# Patient Record
Sex: Female | Born: 1940 | Race: White | Hispanic: No | Smoking: Former smoker
Health system: Southern US, Community
[De-identification: ages and names within clinical notes are randomized; demographics above are authoritative.]

## PROBLEM LIST (undated history)

## (undated) DIAGNOSIS — I779 Disorder of arteries and arterioles, unspecified: Secondary | ICD-10-CM

## (undated) DIAGNOSIS — I10 Essential (primary) hypertension: Secondary | ICD-10-CM

## (undated) DIAGNOSIS — M8580 Other specified disorders of bone density and structure, unspecified site: Secondary | ICD-10-CM

## (undated) DIAGNOSIS — E785 Hyperlipidemia, unspecified: Secondary | ICD-10-CM

## (undated) DIAGNOSIS — I739 Peripheral vascular disease, unspecified: Secondary | ICD-10-CM

## (undated) DIAGNOSIS — M1990 Unspecified osteoarthritis, unspecified site: Secondary | ICD-10-CM

## (undated) DIAGNOSIS — F410 Panic disorder [episodic paroxysmal anxiety] without agoraphobia: Secondary | ICD-10-CM

## (undated) DIAGNOSIS — F419 Anxiety disorder, unspecified: Secondary | ICD-10-CM

## (undated) DIAGNOSIS — C801 Malignant (primary) neoplasm, unspecified: Secondary | ICD-10-CM

## (undated) DIAGNOSIS — F329 Major depressive disorder, single episode, unspecified: Secondary | ICD-10-CM

## (undated) DIAGNOSIS — R945 Abnormal results of liver function studies: Secondary | ICD-10-CM

## (undated) DIAGNOSIS — R7989 Other specified abnormal findings of blood chemistry: Secondary | ICD-10-CM

## (undated) DIAGNOSIS — E041 Nontoxic single thyroid nodule: Secondary | ICD-10-CM

## (undated) DIAGNOSIS — J449 Chronic obstructive pulmonary disease, unspecified: Secondary | ICD-10-CM

## (undated) DIAGNOSIS — H34239 Retinal artery branch occlusion, unspecified eye: Secondary | ICD-10-CM

## (undated) DIAGNOSIS — I2510 Atherosclerotic heart disease of native coronary artery without angina pectoris: Secondary | ICD-10-CM

## (undated) DIAGNOSIS — H348122 Central retinal vein occlusion, left eye, stable: Secondary | ICD-10-CM

## (undated) DIAGNOSIS — F32A Depression, unspecified: Secondary | ICD-10-CM

## (undated) HISTORY — DX: Abnormal results of liver function studies: R94.5

## (undated) HISTORY — DX: Other specified abnormal findings of blood chemistry: R79.89

## (undated) HISTORY — DX: Peripheral vascular disease, unspecified: I73.9

## (undated) HISTORY — DX: Chronic obstructive pulmonary disease, unspecified: J44.9

## (undated) HISTORY — DX: Atherosclerotic heart disease of native coronary artery without angina pectoris: I25.10

## (undated) HISTORY — DX: Disorder of arteries and arterioles, unspecified: I77.9

## (undated) HISTORY — DX: Malignant (primary) neoplasm, unspecified: C80.1

## (undated) HISTORY — DX: Major depressive disorder, single episode, unspecified: F32.9

## (undated) HISTORY — DX: Central retinal vein occlusion, left eye, stable: H34.8122

## (undated) HISTORY — DX: Anxiety disorder, unspecified: F41.9

## (undated) HISTORY — PX: CORONARY ANGIOPLASTY WITH STENT PLACEMENT: SHX49

## (undated) HISTORY — DX: Hyperlipidemia, unspecified: E78.5

## (undated) HISTORY — DX: Nontoxic single thyroid nodule: E04.1

## (undated) HISTORY — DX: Essential (primary) hypertension: I10

## (undated) HISTORY — DX: Other specified disorders of bone density and structure, unspecified site: M85.80

## (undated) HISTORY — PX: VAGINAL HYSTERECTOMY: SUR661

## (undated) HISTORY — DX: Retinal artery branch occlusion, unspecified eye: H34.239

## (undated) HISTORY — DX: Depression, unspecified: F32.A

---

## 1959-04-12 HISTORY — PX: KIDNEY SURGERY: SHX687

## 2006-10-12 ENCOUNTER — Encounter: Payer: Self-pay | Admitting: Internal Medicine

## 2007-01-25 ENCOUNTER — Ambulatory Visit: Payer: Self-pay | Admitting: Internal Medicine

## 2007-01-25 DIAGNOSIS — M949 Disorder of cartilage, unspecified: Secondary | ICD-10-CM

## 2007-01-25 DIAGNOSIS — M899 Disorder of bone, unspecified: Secondary | ICD-10-CM | POA: Insufficient documentation

## 2007-01-25 DIAGNOSIS — E785 Hyperlipidemia, unspecified: Secondary | ICD-10-CM | POA: Insufficient documentation

## 2007-01-25 DIAGNOSIS — I779 Disorder of arteries and arterioles, unspecified: Secondary | ICD-10-CM | POA: Insufficient documentation

## 2007-01-25 DIAGNOSIS — R945 Abnormal results of liver function studies: Secondary | ICD-10-CM

## 2007-01-25 DIAGNOSIS — I739 Peripheral vascular disease, unspecified: Secondary | ICD-10-CM

## 2007-01-25 DIAGNOSIS — J449 Chronic obstructive pulmonary disease, unspecified: Secondary | ICD-10-CM

## 2007-01-29 ENCOUNTER — Encounter: Admission: RE | Admit: 2007-01-29 | Discharge: 2007-01-29 | Payer: Self-pay | Admitting: Internal Medicine

## 2007-02-09 ENCOUNTER — Telehealth: Payer: Self-pay | Admitting: Internal Medicine

## 2007-02-10 LAB — CONVERTED CEMR LAB
Albumin: 3.9 g/dL (ref 3.5–5.2)
Basophils Absolute: 0 10*3/uL (ref 0.0–0.1)
Bilirubin, Direct: 0.1 mg/dL (ref 0.0–0.3)
Direct LDL: 128.1 mg/dL
Eosinophils Absolute: 0.1 10*3/uL (ref 0.0–0.6)
GFR calc non Af Amer: 131 mL/min
Glucose, Bld: 85 mg/dL (ref 70–99)
HCT: 44.6 % (ref 36.0–46.0)
Hemoglobin: 15.6 g/dL — ABNORMAL HIGH (ref 12.0–15.0)
Lymphocytes Relative: 32.4 % (ref 12.0–46.0)
MCHC: 34.9 g/dL (ref 30.0–36.0)
MCV: 93.6 fL (ref 78.0–100.0)
Monocytes Absolute: 0.5 10*3/uL (ref 0.2–0.7)
Neutro Abs: 4.5 10*3/uL (ref 1.4–7.7)
Neutrophils Relative %: 60.2 % (ref 43.0–77.0)
Potassium: 3.6 meq/L (ref 3.5–5.1)
Sodium: 142 meq/L (ref 135–145)
Triglycerides: 88 mg/dL (ref 0–149)

## 2007-04-26 ENCOUNTER — Ambulatory Visit: Payer: Self-pay | Admitting: Internal Medicine

## 2007-04-26 DIAGNOSIS — Z87891 Personal history of nicotine dependence: Secondary | ICD-10-CM | POA: Insufficient documentation

## 2007-04-26 DIAGNOSIS — E041 Nontoxic single thyroid nodule: Secondary | ICD-10-CM | POA: Insufficient documentation

## 2007-04-27 LAB — CONVERTED CEMR LAB
Cholesterol: 233 mg/dL (ref 0–200)
Direct LDL: 156 mg/dL
Total CHOL/HDL Ratio: 4.1
Triglycerides: 139 mg/dL (ref 0–149)

## 2007-05-06 ENCOUNTER — Encounter: Admission: RE | Admit: 2007-05-06 | Discharge: 2007-05-06 | Payer: Self-pay | Admitting: Internal Medicine

## 2007-06-08 ENCOUNTER — Encounter: Payer: Self-pay | Admitting: Internal Medicine

## 2007-06-08 ENCOUNTER — Encounter (INDEPENDENT_AMBULATORY_CARE_PROVIDER_SITE_OTHER): Payer: Self-pay | Admitting: Interventional Radiology

## 2007-06-08 ENCOUNTER — Other Ambulatory Visit: Admission: RE | Admit: 2007-06-08 | Discharge: 2007-06-08 | Payer: Self-pay | Admitting: Family Medicine

## 2007-06-08 ENCOUNTER — Encounter: Admission: RE | Admit: 2007-06-08 | Discharge: 2007-06-08 | Payer: Self-pay | Admitting: Internal Medicine

## 2007-06-14 ENCOUNTER — Ambulatory Visit: Payer: Self-pay | Admitting: Internal Medicine

## 2007-07-05 ENCOUNTER — Encounter: Payer: Self-pay | Admitting: Internal Medicine

## 2007-07-30 ENCOUNTER — Encounter (INDEPENDENT_AMBULATORY_CARE_PROVIDER_SITE_OTHER): Payer: Self-pay | Admitting: *Deleted

## 2007-08-02 ENCOUNTER — Ambulatory Visit: Payer: Self-pay | Admitting: Internal Medicine

## 2007-08-10 LAB — CONVERTED CEMR LAB
Cholesterol: 176 mg/dL (ref 0–200)
HDL: 63.3 mg/dL (ref >39.0)
LDL Cholesterol: 88 mg/dL (ref 0–99)
Triglycerides: 124 mg/dL (ref 0–149)
VLDL: 25 mg/dL (ref 0–40)

## 2007-08-13 ENCOUNTER — Telehealth (INDEPENDENT_AMBULATORY_CARE_PROVIDER_SITE_OTHER): Payer: Self-pay | Admitting: *Deleted

## 2007-08-24 ENCOUNTER — Encounter: Admission: RE | Admit: 2007-08-24 | Discharge: 2007-08-24 | Payer: Self-pay | Admitting: Internal Medicine

## 2007-08-30 ENCOUNTER — Telehealth (INDEPENDENT_AMBULATORY_CARE_PROVIDER_SITE_OTHER): Payer: Self-pay | Admitting: *Deleted

## 2007-09-13 ENCOUNTER — Ambulatory Visit: Payer: Self-pay | Admitting: Internal Medicine

## 2008-01-10 ENCOUNTER — Telehealth: Payer: Self-pay | Admitting: Internal Medicine

## 2008-01-12 ENCOUNTER — Ambulatory Visit: Payer: Self-pay | Admitting: Internal Medicine

## 2008-01-12 DIAGNOSIS — L989 Disorder of the skin and subcutaneous tissue, unspecified: Secondary | ICD-10-CM | POA: Insufficient documentation

## 2008-01-13 ENCOUNTER — Telehealth (INDEPENDENT_AMBULATORY_CARE_PROVIDER_SITE_OTHER): Payer: Self-pay | Admitting: *Deleted

## 2008-01-14 LAB — CONVERTED CEMR LAB
BUN: 9 mg/dL (ref 6–23)
Chloride: 114 meq/L — ABNORMAL HIGH (ref 96–112)
Cholesterol: 155 mg/dL (ref 0–200)
Creatinine, Ser: 0.6 mg/dL (ref 0.4–1.2)
GFR calc Af Amer: 128 mL/min
GFR calc non Af Amer: 106 mL/min
Hemoglobin: 15.1 g/dL — ABNORMAL HIGH (ref 12.0–15.0)
LDL Cholesterol: 84 mg/dL (ref 0–99)
TSH: 1.14 microintl units/mL (ref 0.35–5.50)
Total CHOL/HDL Ratio: 2.6
Triglycerides: 54 mg/dL (ref 0–149)
VLDL: 11 mg/dL (ref 0–40)

## 2008-01-18 ENCOUNTER — Telehealth (INDEPENDENT_AMBULATORY_CARE_PROVIDER_SITE_OTHER): Payer: Self-pay | Admitting: *Deleted

## 2008-01-19 ENCOUNTER — Telehealth (INDEPENDENT_AMBULATORY_CARE_PROVIDER_SITE_OTHER): Payer: Self-pay | Admitting: *Deleted

## 2008-01-24 ENCOUNTER — Ambulatory Visit (HOSPITAL_COMMUNITY): Admission: RE | Admit: 2008-01-24 | Discharge: 2008-01-24 | Payer: Self-pay | Admitting: Gynecology

## 2008-01-24 ENCOUNTER — Other Ambulatory Visit: Admission: RE | Admit: 2008-01-24 | Discharge: 2008-01-24 | Payer: Self-pay | Admitting: Gynecology

## 2008-01-24 ENCOUNTER — Encounter: Payer: Self-pay | Admitting: Internal Medicine

## 2008-01-25 ENCOUNTER — Ambulatory Visit: Payer: Self-pay | Admitting: Internal Medicine

## 2008-01-25 ENCOUNTER — Encounter: Payer: Self-pay | Admitting: Internal Medicine

## 2008-01-31 ENCOUNTER — Ambulatory Visit (HOSPITAL_COMMUNITY): Admission: RE | Admit: 2008-01-31 | Discharge: 2008-01-31 | Payer: Self-pay | Admitting: Gynecology

## 2008-02-04 ENCOUNTER — Encounter: Admission: RE | Admit: 2008-02-04 | Discharge: 2008-02-04 | Payer: Self-pay | Admitting: Internal Medicine

## 2008-02-09 ENCOUNTER — Encounter: Admission: RE | Admit: 2008-02-09 | Discharge: 2008-02-09 | Payer: Self-pay | Admitting: Surgery

## 2008-02-16 ENCOUNTER — Telehealth (INDEPENDENT_AMBULATORY_CARE_PROVIDER_SITE_OTHER): Payer: Self-pay | Admitting: *Deleted

## 2008-04-20 ENCOUNTER — Ambulatory Visit: Payer: Self-pay | Admitting: Gynecology

## 2008-04-20 ENCOUNTER — Encounter: Payer: Self-pay | Admitting: Internal Medicine

## 2008-04-25 ENCOUNTER — Telehealth (INDEPENDENT_AMBULATORY_CARE_PROVIDER_SITE_OTHER): Payer: Self-pay | Admitting: *Deleted

## 2008-04-28 ENCOUNTER — Encounter: Admission: RE | Admit: 2008-04-28 | Discharge: 2008-04-28 | Payer: Self-pay | Admitting: Internal Medicine

## 2008-05-05 ENCOUNTER — Telehealth (INDEPENDENT_AMBULATORY_CARE_PROVIDER_SITE_OTHER): Payer: Self-pay | Admitting: *Deleted

## 2008-06-08 ENCOUNTER — Ambulatory Visit: Payer: Self-pay | Admitting: Internal Medicine

## 2008-07-11 ENCOUNTER — Ambulatory Visit: Payer: Self-pay | Admitting: Internal Medicine

## 2008-07-14 ENCOUNTER — Encounter (INDEPENDENT_AMBULATORY_CARE_PROVIDER_SITE_OTHER): Payer: Self-pay | Admitting: *Deleted

## 2008-08-15 ENCOUNTER — Ambulatory Visit: Payer: Self-pay | Admitting: Cardiovascular Disease

## 2008-09-06 ENCOUNTER — Ambulatory Visit: Payer: Self-pay | Admitting: Cardiovascular Disease

## 2008-09-06 LAB — CONVERTED CEMR LAB
BUN: 13 mg/dL (ref 6–23)
CO2: 32 meq/L (ref 19–32)
Chloride: 106 meq/L (ref 96–112)
Glucose, Bld: 99 mg/dL (ref 70–99)
Potassium: 3.8 meq/L (ref 3.5–5.1)
Sodium: 142 meq/L (ref 135–145)

## 2008-09-29 ENCOUNTER — Telehealth: Payer: Self-pay | Admitting: Internal Medicine

## 2008-10-14 ENCOUNTER — Encounter: Payer: Self-pay | Admitting: Internal Medicine

## 2008-10-24 ENCOUNTER — Ambulatory Visit: Payer: Self-pay

## 2008-11-28 ENCOUNTER — Telehealth: Payer: Self-pay | Admitting: Cardiovascular Disease

## 2009-03-02 ENCOUNTER — Telehealth: Payer: Self-pay | Admitting: Cardiovascular Disease

## 2009-04-17 ENCOUNTER — Other Ambulatory Visit: Admission: RE | Admit: 2009-04-17 | Discharge: 2009-04-17 | Payer: Self-pay | Admitting: Gynecology

## 2009-04-17 ENCOUNTER — Ambulatory Visit: Payer: Self-pay | Admitting: Gynecology

## 2009-04-17 ENCOUNTER — Encounter: Payer: Self-pay | Admitting: Gynecology

## 2009-04-25 ENCOUNTER — Encounter: Admission: RE | Admit: 2009-04-25 | Discharge: 2009-04-25 | Payer: Self-pay | Admitting: Gynecology

## 2009-05-11 ENCOUNTER — Ambulatory Visit: Payer: Self-pay

## 2009-05-11 ENCOUNTER — Encounter: Payer: Self-pay | Admitting: Cardiovascular Disease

## 2009-05-18 ENCOUNTER — Ambulatory Visit: Payer: Self-pay | Admitting: Internal Medicine

## 2009-05-18 DIAGNOSIS — F419 Anxiety disorder, unspecified: Secondary | ICD-10-CM

## 2009-05-18 DIAGNOSIS — F329 Major depressive disorder, single episode, unspecified: Secondary | ICD-10-CM

## 2009-07-20 ENCOUNTER — Ambulatory Visit: Payer: Self-pay | Admitting: Internal Medicine

## 2009-07-20 DIAGNOSIS — I10 Essential (primary) hypertension: Secondary | ICD-10-CM | POA: Insufficient documentation

## 2009-07-25 ENCOUNTER — Encounter (INDEPENDENT_AMBULATORY_CARE_PROVIDER_SITE_OTHER): Payer: Self-pay | Admitting: *Deleted

## 2009-07-25 LAB — CONVERTED CEMR LAB
AST: 23 units/L (ref 0–37)
Basophils Relative: 0.6 % (ref 0.0–3.0)
CO2: 29 meq/L (ref 19–32)
Calcium: 9.2 mg/dL (ref 8.4–10.5)
Chloride: 101 meq/L (ref 96–112)
Eosinophils Absolute: 0.1 10*3/uL (ref 0.0–0.7)
Glucose, Bld: 76 mg/dL (ref 70–99)
HCT: 45.7 % (ref 36.0–46.0)
HDL: 65.7 mg/dL (ref >39.00)
Hemoglobin: 15.8 g/dL — ABNORMAL HIGH (ref 12.0–15.0)
Lymphocytes Relative: 30.3 % (ref 12.0–46.0)
Lymphs Abs: 2.1 10*3/uL (ref 0.7–4.0)
MCHC: 34.5 g/dL (ref 30.0–36.0)
Neutro Abs: 4.2 10*3/uL (ref 1.4–7.7)
Potassium: 4.2 meq/L (ref 3.5–5.1)
RBC: 4.83 M/uL (ref 3.87–5.11)
RDW: 12.8 % (ref 11.5–14.6)
Sodium: 138 meq/L (ref 135–145)
Total CHOL/HDL Ratio: 3
VLDL: 19.4 mg/dL (ref 0.0–40.0)

## 2009-08-17 ENCOUNTER — Telehealth: Payer: Self-pay | Admitting: Internal Medicine

## 2009-09-03 ENCOUNTER — Ambulatory Visit: Payer: Self-pay | Admitting: Internal Medicine

## 2009-09-18 ENCOUNTER — Ambulatory Visit: Payer: Self-pay | Admitting: Cardiovascular Disease

## 2009-10-08 ENCOUNTER — Ambulatory Visit: Payer: Self-pay | Admitting: Internal Medicine

## 2009-10-08 ENCOUNTER — Emergency Department (HOSPITAL_COMMUNITY): Admission: EM | Admit: 2009-10-08 | Discharge: 2009-10-08 | Payer: Self-pay | Admitting: Internal Medicine

## 2009-10-16 ENCOUNTER — Ambulatory Visit: Payer: Self-pay | Admitting: Internal Medicine

## 2009-10-16 DIAGNOSIS — J309 Allergic rhinitis, unspecified: Secondary | ICD-10-CM

## 2009-11-15 ENCOUNTER — Encounter: Payer: Self-pay | Admitting: Cardiovascular Disease

## 2009-11-16 ENCOUNTER — Ambulatory Visit: Payer: Self-pay

## 2009-11-16 ENCOUNTER — Encounter: Payer: Self-pay | Admitting: Cardiovascular Disease

## 2009-11-27 ENCOUNTER — Encounter: Admission: RE | Admit: 2009-11-27 | Discharge: 2009-11-27 | Payer: Self-pay | Admitting: Cardiovascular Disease

## 2010-04-08 ENCOUNTER — Ambulatory Visit: Payer: Self-pay | Admitting: Internal Medicine

## 2010-04-08 LAB — CONVERTED CEMR LAB: Vit D, 25-Hydroxy: 22 ng/mL — ABNORMAL LOW (ref 30–89)

## 2010-04-10 LAB — CONVERTED CEMR LAB
Calcium: 9.4 mg/dL (ref 8.4–10.5)
Chloride: 96 meq/L (ref 96–112)
Cholesterol: 183 mg/dL (ref 0–200)
Creatinine, Ser: 0.6 mg/dL (ref 0.4–1.2)
HDL: 73.4 mg/dL (ref >39.00)
Sodium: 135 meq/L (ref 135–145)
Triglycerides: 74 mg/dL (ref 0.0–149.0)
Uric Acid, Serum: 4 mg/dL (ref 2.4–7.0)

## 2010-04-25 ENCOUNTER — Ambulatory Visit: Payer: Self-pay | Admitting: Gynecology

## 2010-04-25 ENCOUNTER — Other Ambulatory Visit: Admission: RE | Admit: 2010-04-25 | Discharge: 2010-04-25 | Payer: Self-pay | Admitting: Gynecology

## 2010-04-29 ENCOUNTER — Encounter: Admission: RE | Admit: 2010-04-29 | Discharge: 2010-04-29 | Payer: Self-pay | Admitting: Gynecology

## 2010-05-06 ENCOUNTER — Encounter: Payer: Self-pay | Admitting: Internal Medicine

## 2010-05-06 ENCOUNTER — Ambulatory Visit: Payer: Self-pay | Admitting: Family Medicine

## 2010-05-08 ENCOUNTER — Encounter: Admission: RE | Admit: 2010-05-08 | Discharge: 2010-05-08 | Payer: Self-pay | Admitting: Gynecology

## 2010-05-09 ENCOUNTER — Ambulatory Visit: Payer: Self-pay | Admitting: Internal Medicine

## 2010-05-10 ENCOUNTER — Ambulatory Visit: Payer: Self-pay | Admitting: Internal Medicine

## 2010-05-15 ENCOUNTER — Encounter: Payer: Self-pay | Admitting: Internal Medicine

## 2010-05-21 ENCOUNTER — Encounter: Payer: Self-pay | Admitting: Internal Medicine

## 2010-07-02 ENCOUNTER — Ambulatory Visit: Payer: Self-pay | Admitting: Cardiovascular Disease

## 2010-07-02 ENCOUNTER — Encounter: Payer: Self-pay | Admitting: Cardiovascular Disease

## 2010-07-02 DIAGNOSIS — I2510 Atherosclerotic heart disease of native coronary artery without angina pectoris: Secondary | ICD-10-CM

## 2010-07-02 DIAGNOSIS — R0609 Other forms of dyspnea: Secondary | ICD-10-CM

## 2010-07-02 DIAGNOSIS — R0989 Other specified symptoms and signs involving the circulatory and respiratory systems: Secondary | ICD-10-CM

## 2010-07-09 ENCOUNTER — Ambulatory Visit: Payer: Self-pay | Admitting: Gynecology

## 2010-07-10 ENCOUNTER — Telehealth (INDEPENDENT_AMBULATORY_CARE_PROVIDER_SITE_OTHER): Payer: Self-pay | Admitting: Radiology

## 2010-07-12 ENCOUNTER — Telehealth: Payer: Self-pay | Admitting: Internal Medicine

## 2010-07-15 ENCOUNTER — Ambulatory Visit: Payer: Self-pay | Admitting: Gynecology

## 2010-07-16 ENCOUNTER — Ambulatory Visit: Payer: Self-pay

## 2010-07-16 ENCOUNTER — Encounter (HOSPITAL_COMMUNITY)
Admission: RE | Admit: 2010-07-16 | Discharge: 2010-09-10 | Payer: Self-pay | Source: Home / Self Care | Attending: Cardiovascular Disease | Admitting: Cardiovascular Disease

## 2010-07-16 ENCOUNTER — Encounter (INDEPENDENT_AMBULATORY_CARE_PROVIDER_SITE_OTHER): Payer: Self-pay | Admitting: *Deleted

## 2010-07-16 ENCOUNTER — Encounter: Payer: Self-pay | Admitting: Cardiology

## 2010-07-16 ENCOUNTER — Ambulatory Visit: Payer: Self-pay | Admitting: Cardiovascular Disease

## 2010-07-16 ENCOUNTER — Encounter: Payer: Self-pay | Admitting: *Deleted

## 2010-07-16 LAB — CONVERTED CEMR LAB
Basophils Relative: 0.3 % (ref 0.0–3.0)
CO2: 32 meq/L (ref 19–32)
Chloride: 93 meq/L — ABNORMAL LOW (ref 96–112)
Eosinophils Absolute: 0 10*3/uL (ref 0.0–0.7)
HCT: 45.1 % (ref 36.0–46.0)
Hemoglobin: 15.6 g/dL — ABNORMAL HIGH (ref 12.0–15.0)
Lymphs Abs: 2.6 10*3/uL (ref 0.7–4.0)
MCHC: 34.6 g/dL (ref 30.0–36.0)
MCV: 92.6 fL (ref 78.0–100.0)
Monocytes Absolute: 0.7 10*3/uL (ref 0.1–1.0)
Neutro Abs: 9.2 10*3/uL — ABNORMAL HIGH (ref 1.4–7.7)
RBC: 4.88 M/uL (ref 3.87–5.11)
Sodium: 137 meq/L (ref 135–145)

## 2010-07-17 ENCOUNTER — Ambulatory Visit
Admission: RE | Admit: 2010-07-17 | Payer: Self-pay | Source: Home / Self Care | Attending: Cardiology | Admitting: Cardiology

## 2010-07-17 ENCOUNTER — Inpatient Hospital Stay (HOSPITAL_COMMUNITY)
Admission: AD | Admit: 2010-07-17 | Discharge: 2010-07-18 | Payer: Self-pay | Source: Home / Self Care | Admitting: Cardiology

## 2010-08-02 ENCOUNTER — Ambulatory Visit: Payer: Self-pay | Admitting: Internal Medicine

## 2010-08-02 ENCOUNTER — Encounter: Payer: Self-pay | Admitting: Internal Medicine

## 2010-08-06 ENCOUNTER — Encounter (INDEPENDENT_AMBULATORY_CARE_PROVIDER_SITE_OTHER): Payer: Self-pay | Admitting: *Deleted

## 2010-08-08 ENCOUNTER — Ambulatory Visit: Payer: Self-pay | Admitting: Cardiovascular Disease

## 2010-08-08 ENCOUNTER — Encounter: Payer: Self-pay | Admitting: Cardiovascular Disease

## 2010-08-09 ENCOUNTER — Ambulatory Visit: Payer: Self-pay | Admitting: Internal Medicine

## 2010-08-13 ENCOUNTER — Ambulatory Visit
Admission: RE | Admit: 2010-08-13 | Discharge: 2010-08-13 | Payer: Self-pay | Source: Home / Self Care | Attending: Internal Medicine | Admitting: Internal Medicine

## 2010-08-13 ENCOUNTER — Encounter: Payer: Self-pay | Admitting: Internal Medicine

## 2010-08-13 LAB — CONVERTED CEMR LAB
AST: 21 units/L (ref 0–37)
Cholesterol: 187 mg/dL (ref 0–200)
LDL Cholesterol: 103 mg/dL — ABNORMAL HIGH (ref 0–99)
Total CHOL/HDL Ratio: 3
VLDL: 16.2 mg/dL (ref 0.0–40.0)

## 2010-08-16 ENCOUNTER — Telehealth: Payer: Self-pay | Admitting: Internal Medicine

## 2010-08-30 ENCOUNTER — Other Ambulatory Visit: Payer: Self-pay | Admitting: Internal Medicine

## 2010-08-30 ENCOUNTER — Ambulatory Visit
Admission: RE | Admit: 2010-08-30 | Discharge: 2010-08-30 | Payer: Self-pay | Source: Home / Self Care | Attending: Internal Medicine | Admitting: Internal Medicine

## 2010-08-30 LAB — FECAL OCCULT BLOOD, IMMUNOCHEMICAL: Fecal Occult Bld: NEGATIVE

## 2010-09-01 ENCOUNTER — Encounter: Payer: Self-pay | Admitting: Internal Medicine

## 2010-09-02 ENCOUNTER — Encounter (INDEPENDENT_AMBULATORY_CARE_PROVIDER_SITE_OTHER): Payer: Self-pay | Admitting: *Deleted

## 2010-09-11 NOTE — Progress Notes (Signed)
Summary: nuc pre-procedure  Phone Note Outgoing Call   Call placed by: Harlow Asa CNMT Call placed to: Patient Reason for Call: Confirm/change Appt Summary of Call: Reviewed information on Myoview Information Sheet (see scanned document for further details).  Spoke with patient.      Nuclear Med Background Indications for Stress Test: Evaluation for Ischemia   History: COPD   Symptoms: Chest Pain, DOE, SOB    Nuclear Pre-Procedure Cardiac Risk Factors: Carotid Disease, Family History - CAD, Hypertension, Lipids, Smoker Height (in): 63 Tech Comments: mod. bilateral stenosis

## 2010-09-11 NOTE — Assessment & Plan Note (Signed)
Summary: goute??/kn   Vital Signs:  Patient profile:   70 year old female Height:      63 inches Weight:      119.25 pounds BMI:     21.20 Pulse rate:   89 / minute Pulse rhythm:   regular BP sitting:   126 / 78  (left arm) Cuff size:   regular  Vitals Entered By: [name] CMA (April 08, 2010 8:41 AM) CC: Pt here for f/u fasting Comments c/o left foot pain- on top. Red, warm to touch- only at night does the pain bother her.    History of Present Illness: ?gout--2 episodes of severe pain at the dorsum of the left foot twice last week, symptoms self subsided. There was some associated redness, swelling and warmness now essentially asymptomatic denies injury or  exercising more than usual  COPD-- still smoking , 1/2 ppd   Hyperlipidemia-- good medication compliance    Depression--self d/c SSRIs 12-2009 , felt she didn't need it, doing well  Hypertension-- good medication compliance , no ambulatory BPs     Current Medications (verified): 1)  Actonel 150 Mg Tabs (Risedronate Sodium) .... Qmo 2)  Simvastatin 20 Mg  Tabs (Simvastatin) .Marland Kitchen.. 1 By Mouth Qhs 3)  Aspirin 81 Mg  Tbec (Aspirin) .Marland Kitchen.. 1 A Day 4)  Diovan 80 Mg Tabs (Valsartan) .... Take One Tablet By Mouth Daily 5)  Hydrochlorothiazide 25 Mg Tabs (Hydrochlorothiazide) .Marland Kitchen.. 1 A Day 6)  Calcium Carbonate-Vitamin D 600-400 Mg-Unit  Tabs (Calcium Carbonate-Vitamin D) .... Take 1 Tablet By Mouth Two Times A Day 7)  Aleve 220 Mg Tabs (Naproxen Sodium) .... As Needed 8)  Simply Sleep 25 Mg Tabs (Diphenhydramine Hcl (Sleep)) .... As Needed 9)  Claritin 10 Mg Caps (Loratadine) .... One A Day As Needed For Allergies 10)  Flonase 50 Mcg/act Susp (Fluticasone Propionate) .... 2  Sprays in Each Side of The Nose Once Daily  Allergies (verified): No Known Drug Allergies  Past History:  Past Medical History: CAROTID ARTERY DISEASE-- moderate asymptomatic Osteopenia COPD Hyperlipidemia Thyroid nodule TOBACCO  ABUSE abnormal CXR 7-09 f/u by a normal CT chest LIVER FUNCTION TESTS, ABNORMAL  Depression Hypertension, intolerant to ACEi (cough) Allergic rhinitis  Past Surgical History: Reviewed history from 09/12/2009 and no changes required. 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 retired from office work since 2003.  Widow tobbacco-- smoke  for approximately 50  years, currently 1/2 ppd  alcohol-- socially      Review of Systems CV:  Denies chest pain or discomfort and palpitations. Resp:  Denies cough, coughing up blood, and sputum productive. GI:  Denies diarrhea, nausea, and vomiting.  Physical Exam  General:  alert, well-developed, and well-nourished.   Neck:  no mass, neck  is symmetric to palpation Lungs:  normal respiratory effort, no intercostal retractions, no accessory muscle use, and decreased breath sounds Heart:  normal rate, regular rhythm, and no murmur.   Pulses:  normal pedal pulses bilaterally Extremities:  no lower extremity edema, left foot normal to inspection and palpation Psych:  Oriented X3, good eye contact, not anxious appearing, and not depressed appearing.     Impression & Recommendations:  Problem # 1:  ? of GOUT, UNSPECIFIED (ICD-274.9) ?gout, symptoms not typical Will check uric acid Rx--Observation   Her updated medication list for this problem includes:    Aleve 220 Mg Tabs (Naproxen sodium) .Marland Kitchen... As needed  Orders: Venipuncture (  45409) TLB-Uric Acid, Blood (84550-URIC)  Problem # 2:  HYPERTENSION (ICD-401.9) at goal  Her updated medication list for this problem includes:    Diovan 80 Mg Tabs (Valsartan) .Marland Kitchen... Take one tablet by mouth daily    Hydrochlorothiazide 25 Mg Tabs (Hydrochlorothiazide) .Marland Kitchen... 1 a day  BP today: 126/78 Prior BP: 100/60 (10/16/2009)  Labs Reviewed: K+: 4.2 (07/20/2009) Creat: : 0.6 (07/20/2009)   Chol:  176 (07/20/2009)   HDL: 65.70 (07/20/2009)   LDL: 91 (07/20/2009)   TG: 97.0 (07/20/2009)  Orders: TLB-BMP (Basic Metabolic Panel-BMET) (80048-METABOL)  Problem # 3:  HYPERLIPIDEMIA, BORDERLINE (ICD-272.4) labs Her updated medication list for this problem includes:    Simvastatin 20 Mg Tabs (Simvastatin) .Marland Kitchen... 1 by mouth qhs  Labs Reviewed: SGOT: 23 (07/20/2009)   SGPT: 22 (07/20/2009)   HDL:65.70 (07/20/2009), 60.2 (01/12/2008)  LDL:91 (07/20/2009), 84 (01/12/2008)  Chol:176 (07/20/2009), 155 (01/12/2008)  Trig:97.0 (07/20/2009), 54 (01/12/2008)  Orders: TLB-Lipid Panel (80061-LIPID) TLB-ALT (SGPT) (84460-ALT) TLB-AST (SGOT) (84450-SGOT)  Problem # 4:  DEPRESSION (ICD-311) self discontinue medication 12/2009, currently doing well The following medications were removed from the medication list:    Fluoxetine Hcl 20 Mg Tabs (Fluoxetine hcl) ..... Once daily  Problem # 5:  OSTEOPENIA (ICD-733.90) Had DEXA 6-09: osteopenia , to have another dexa next month @ gyn  check vitamin D Her updated medication list for this problem includes:    Actonel 150 Mg Tabs (Risedronate sodium) ..... Qmo    Calcium Carbonate-vitamin D 600-400 Mg-unit Tabs (Calcium carbonate-vitamin d) .Marland Kitchen... Take 1 tablet by mouth two times a day     Problem # 6:  THYROID NODULE (ICD-241.0) again, the patient decided not to go for surgery  Complete Medication List: 1)  Actonel 150 Mg Tabs (Risedronate sodium) .... Qmo 2)  Simvastatin 20 Mg Tabs (Simvastatin) .Marland Kitchen.. 1 by mouth qhs 3)  Aspirin 81 Mg Tbec (Aspirin) .Marland Kitchen.. 1 a day 4)  Diovan 80 Mg Tabs (Valsartan) .... Take one tablet by mouth daily 5)  Hydrochlorothiazide 25 Mg Tabs (Hydrochlorothiazide) .Marland Kitchen.. 1 a day 6)  Calcium Carbonate-vitamin D 600-400 Mg-unit Tabs (Calcium carbonate-vitamin d) .... Take 1 tablet by mouth two times a day 7)  Aleve 220 Mg Tabs (Naproxen sodium) .... As needed 8)  Simply Sleep 25 Mg Tabs (Diphenhydramine hcl (sleep)) .... As  needed 9)  Claritin 10 Mg Caps (Loratadine) .... One a day as needed for allergies 10)  Flonase 50 Mcg/act Susp (Fluticasone propionate) .... 2  sprays in each side of the nose once daily  Other Orders: T-Vitamin D (25-Hydroxy) (81191-47829)  Patient Instructions: 1)  come back for a physical exam in 3-4 months

## 2010-09-11 NOTE — Assessment & Plan Note (Signed)
Summary: Cardiology Nuclear Testing  Nuclear Med Background Indications for Stress Test: Evaluation for Ischemia   History: COPD  History Comments: No documented CAD  Symptoms: Chest Pain, Chest Pressure, DOE  Symptoms Comments: Last episode of ZO:XWRU a.m., 6/10; none now.   Nuclear Pre-Procedure Cardiac Risk Factors: Carotid Disease, Family History - CAD, Hypertension, Lipids, Smoker Caffeine/Decaff Intake: None NPO After: 10:00 PM Lungs: Clear IV 0.9% NS with Angio Cath: 22g     IV Site: R Hand IV Started by: Irean Hong, RN Chest Size (in) 34     Cup Size B     Height (in): 63 Weight (lb): 117 BMI: 20.80  Nuclear Med Study 1 or 2 day study:  1 day     Stress Test Type:  Treadmill/Lexiscan Reading MD:  Cassell Clement, MD     Referring MD:  Tonny Bollman, MD Resting Radionuclide:  Technetium 60m Tetrofosmin     Resting Radionuclide Dose:  11 mCi  Stress Radionuclide:  Technetium 80m Tetrofosmin     Stress Radionuclide Dose:  33 mCi   Stress Protocol Exercise Time (min):  4:33 min     Max HR:  118 bpm     Predicted Max HR:  150 bpm  Max Systolic BP: 181 mm Hg     Percent Max HR:  78.67 %     METS: 5.1 Rate Pressure Product:  04540  Lexiscan: 0.4 mg   Stress Test Technologist:  Rea [employer], CMA-N     Nuclear Technologist:  Domenic Polite, CNMT  Rest Procedure  Myocardial perfusion imaging was performed at rest 45 minutes following the intravenous administration of Technetium 51m Tetrofosmin.  Stress Procedure  The patient initially walked the treadmill utilizing the Bruce protocol for 4:33, but was unable to reach her target heart rate due to leg fatigue.  She then received IV Lexiscan 0.4 mg over 15-seconds with concurrent low level exercise and then Technetium 14m Tetrofosmin was injected at 30-seconds.  There were marked ST-T wave changes in recovery and occasional PAC's.  Quantitative spect images were obtained after a 45 minute delay.  QPS Raw Data Images:   Normal; no motion artifact; normal heart/lung ratio. Stress Images:  There is decreased uptake in the lateral wall. Rest Images:  Normal homogeneous uptake in all areas of the myocardium. Subtraction (SDS):  Large area of reversible inferolateral ischemia. Transient Ischemic Dilatation:  .95  (Normal <1.22)  Lung/Heart Ratio:  .32  (Normal <0.45)  Quantitative Gated Spect Images QGS EDV:  73 ml QGS ESV:  28 ml QGS EF:  62 % QGS cine images:  No wall motion abnormalites  Findings High risk nuclear study Clinically Abnormal (chest pain, ST abnormality, hypotension) Evidence for lateral ischemia      Overall Impression  Exercise Capacity: Poor exercise capacity.  Converted to [employer]. BP Response: Normal blood pressure response. Clinical Symptoms: No chest pain ECG Impression: Significant ST abnormalities consistent with ischemia. Overall Impression: High risk stress nuclear study. Overall Impression Comments: Large area of reversible inferolateral ischemia. Normal LV systolic function.

## 2010-09-11 NOTE — Miscellaneous (Signed)
Summary: BONE DENSITY  Clinical Lists Changes  Orders: Added new Test order of T-Bone Densitometry (77080) - Signed Added new Test order of T-Lumbar Vertebral Assessment (77082) - Signed 

## 2010-09-11 NOTE — Progress Notes (Signed)
  Phone Note Call from Patient Call back at [facility] Phone [phone]   Summary of Call: Patient was calling to schedule her bp check.  She started HCTZ 25mg  12/10.  BP @ home has been running 150-160/70's, HR 60's.  BMP, bp check now or adjust meds?  Citalopram not working, no interest in anything, wants to stay in bed & sleep all day.   Initial call taken by: Shary Decamp,  August 17, 2009 5:02 PM  Follow-up for Phone Call        --if she does not have a counselor , please refer -- increase citalopram 20mg  from 1 a day to 1.5 a day --continue HCTZ , OV 10d to 2 weeks --sooner if problems , suicidal ideas  Follow-up by: [name] E. Paz MD,  August 20, 2009 8:51 AM  Additional Follow-up for Phone Call Additional follow up Details #1::        discussed with pt, pt declined referral to counselor, ov scheduled Additional Follow-up by: Shary Decamp,  August 20, 2009 11:59 AM    New/Updated Medications: CITALOPRAM HYDROBROMIDE 20 MG TABS (CITALOPRAM HYDROBROMIDE) 1 1/2 by mouth once daily

## 2010-09-11 NOTE — Assessment & Plan Note (Signed)
Summary: f1y   Visit Type:  Follow-up 1 yr. Primary Provider:  Nolon Rod. Paz MD   History of Present Illness: This is a 70 year-old woman with asymptomatic carotid stenosis, presenting today for follow-up evaluation. She last underwent a carotid duplex scan in October 2010 demonstrating moderate RICA stenosis (60-79%), and mild LICA stenosis (40-59%).   She denies numbness, tingling, weakness, amaurosis, or other TIA symptoms. She denies chest pain or shortness of breath.   Lost her son to suicide last year, and has expectedly had difficulty copin with this. Dr Drue Novel has recently changed her antidepressive meds and she states that this has helped.  Current Medications (verified): 1)  Actonel 150 Mg Tabs (Risedronate Sodium) .... Qmo 2)  Simvastatin 20 Mg  Tabs (Simvastatin) .Marland Kitchen.. 1 By Mouth Qhs 3)  Aspirin 81 Mg  Tbec (Aspirin) .Marland Kitchen.. 1 A Day 4)  Diovan 80 Mg Tabs (Valsartan) .... Take One Tablet By Mouth Daily 5)  Fluoxetine Hcl 20 Mg Tabs (Fluoxetine Hcl) .... Once Daily 6)  Hydrochlorothiazide 25 Mg Tabs (Hydrochlorothiazide) .Marland Kitchen.. 1 A Day 7)  Calcium Carbonate-Vitamin D 600-400 Mg-Unit  Tabs (Calcium Carbonate-Vitamin D) .... Take 1 Tablet By Mouth Two Times A Day 8)  Aleve 220 Mg Tabs (Naproxen Sodium) .... As Needed 9)  Simply Sleep 25 Mg Tabs (Diphenhydramine Hcl (Sleep)) .... As Needed  Allergies (verified): No Known Drug Allergies  Past History:  Past medical history reviewed for relevance to current acute and chronic problems.  Past Medical History: abnormal CXR 7-09 f/u by a normal CT chest Current Problems:  CAROTID ARTERY DISEASE (ICD-433.10), moderate asymptomatic HYPERTENSION (ICD-401.9) HYPERLIPIDEMIA, BORDERLINE (ICD-272.4) DEPRESSION (ICD-311) LIVER FUNCTION TESTS, ABNORMAL (ICD-794.8) TOBACCO ABUSE (ICD-305.1) SKIN LESION (ICD-709.9) HEALTH SCREENING (ICD-V70.0) COPD (ICD-496) OSTEOPENIA (ICD-733.90) WEIGHT GAIN (ICD-783.1) FAMILY HISTORY OF CAD FEMALE 1ST  DEGREE RELATIVE <50 (ICD-V17.3) THYROID NODULE (ICD-241.0)    Vital Signs:  Patient profile:   70 year old female Height:      63 inches Weight:      117 pounds Pulse rate:   62 / minute Resp:     16 per minute BP sitting:   130 / 65  (left arm)  Vitals Entered By: Oswald Hillock (September 18, 2009 3:38 PM)  Physical Exam  General:  Pt is alert and oriented, in no acute distress. HEENT: normal Neck: normal carotid upstrokes with a right bruit, JVP normal Lungs: CTA CV: RRR without murmur or gallop Abd: soft, NT, positive BS, no bruit, no organomegaly Ext: no clubbing, cyanosis, or edema. peripheral pulses 2+ and equal Skin: warm and dry without rash    EKG  Procedure date:  05/11/2009  Findings:      60-79% RICA stenosis, 40-59% LICA stenosis  Impression & Recommendations:  Problem # 1:  CAROTID ARTERY DISEASE (ICD-433.10) Pt stable without symptoms. Continue ASA and secondary risk reduction measures...statin for hypercholesterolemia and antihypertensives. Follow-up carotid duplex April 2011, follow-up office visit one year.  Her updated medication list for this problem includes:    Aspirin 81 Mg Tbec (Aspirin) .Marland Kitchen... 1 a day  Problem # 2:  HYPERTENSION (ICD-401.9)  Well-controlled.  Her updated medication list for this problem includes:    Aspirin 81 Mg Tbec (Aspirin) .Marland Kitchen... 1 a day    Diovan 80 Mg Tabs (Valsartan) .Marland Kitchen... Take one tablet by mouth daily    Hydrochlorothiazide 25 Mg Tabs (Hydrochlorothiazide) .Marland Kitchen... 1 a day  BP today: 130/65 Prior BP: 120/80 (09/03/2009)  Labs Reviewed: K+: 4.2 (07/20/2009) Creat: :  0.6 (07/20/2009)   Chol: 176 (07/20/2009)   HDL: 65.70 (07/20/2009)   LDL: 91 (07/20/2009)   TG: 97.0 (07/20/2009)  Her updated medication list for this problem includes:    Aspirin 81 Mg Tbec (Aspirin) .Marland Kitchen... 1 a day    Diovan 80 Mg Tabs (Valsartan) .Marland Kitchen... Take one tablet by mouth daily    Hydrochlorothiazide 25 Mg Tabs (Hydrochlorothiazide)  .Marland Kitchen... 1 a day  Problem # 3:  HYPERLIPIDEMIA, BORDERLINE (ICD-272.4)  lipids at goal - treated by Dr Drue Novel.  Her updated medication list for this problem includes:    Simvastatin 20 Mg Tabs (Simvastatin) .Marland Kitchen... 1 by mouth qhs  CHOL: 176 (07/20/2009)   LDL: 91 (07/20/2009)   HDL: 65.70 (07/20/2009)   TG: 97.0 (07/20/2009)  Her updated medication list for this problem includes:    Simvastatin 20 Mg Tabs (Simvastatin) .Marland Kitchen... 1 by mouth qhs  Patient Instructions: 1)  Your physician recommends that you schedule a follow-up appointment in: please make a follow up appoint for 1 year--we will call you to schedule your carotid dopplers

## 2010-09-11 NOTE — Assessment & Plan Note (Signed)
Summary: rov/sob   Visit Type:  Follow-up Primary Provider:  Nolon Rod. Paz MD  CC:  Chest heaviness comes and goes - Sob.  History of Present Illness: 70 year-old woman presenting for followup of carotid arterial disease. She has moderate, asymptomatic carotid stenosis.  Last carotid duplex 11/16/2009 showed 40-59% bilateral ICA stenosis.  She complains of exertional dyspnea with low-level activity, newly recognized over the past 2-3 weeks. She also describes a pain in her chest and upper back, predominately in the mornings, also over several weeks. She denies exertional chest pain. No edema, palpitations, or syncope. No other complaints.   Current Medications (verified): 1)  Simvastatin 20 Mg  Tabs (Simvastatin) .Marland Kitchen.. 1 By Mouth Qhs 2)  Aspirin 81 Mg  Tbec (Aspirin) .Marland Kitchen.. 1 A Day 3)  Diovan 80 Mg Tabs (Valsartan) .... Take One Tablet By Mouth Daily 4)  Hydrochlorothiazide 25 Mg Tabs (Hydrochlorothiazide) .Marland Kitchen.. 1 A Day 5)  Calcium Carbonate-Vitamin D 600-400 Mg-Unit  Tabs (Calcium Carbonate-Vitamin D) .... Take 1 Tablet By Mouth Two Times A Day 6)  Aleve 220 Mg Tabs (Naproxen Sodium) .... As Needed 7)  Simply Sleep 25 Mg Tabs (Diphenhydramine Hcl (Sleep)) .... As Needed 8)  Claritin 10 Mg Caps (Loratadine) .... One A Day As Needed For Allergies 9)  Flonase 50 Mcg/act Susp (Fluticasone Propionate) .... 2  Sprays in Each Side of The Nose Once Daily  Allergies (verified): No Known Drug Allergies  Past History:  Past medical history reviewed for relevance to current acute and chronic problems.  Past Medical History: Reviewed history from 04/08/2010 and no changes required. CAROTID ARTERY DISEASE-- moderate asymptomatic Osteopenia COPD Hyperlipidemia Thyroid nodule TOBACCO ABUSE abnormal CXR 7-09 f/u by a normal CT chest LIVER FUNCTION TESTS, ABNORMAL  Depression Hypertension, intolerant to ACEi (cough) Allergic rhinitis  Review of Systems       Negative except as per  HPI   Vital Signs:  Patient profile:   70 year old female Height:      63 inches Weight:      119 pounds BMI:     21.16 Pulse rate:   81 / minute Pulse rhythm:   regular Resp:     18 per minute BP sitting:   128 / 70  (left arm) Cuff size:   regular  Vitals Entered By: Vikki Ports (July 02, 2010 9:11 AM)  Physical Exam  General:  Pt is alert and oriented, in no acute distress. HEENT: normal Neck: normal carotid upstrokes without bruits, JVP normal Lungs: CTA CV: RRR without murmur or gallop Abd: soft, NT, positive BS, no bruit, no organomegaly Ext: no clubbing, cyanosis, or edema. peripheral pulses 2+ and equal Skin: warm and dry without rash    EKG  Procedure date:  07/02/2010  Findings:      NSR 81 bpm, nonspecific ST-T abnormality.  Impression & Recommendations:  Problem # 1:  CHEST PAIN, PRECORDIAL (ICD-786.51) Pt with risk factors for CAD, including known carotid disease. Her symptoms have both typical and atypical features. Recommend a Myoview stress test to rule out significant ischemia. Continue ASA and secondary risk reduction measures as below. Tobacco cessation discussed.  Her updated medication list for this problem includes:    Aspirin 81 Mg Tbec (Aspirin) .Marland Kitchen... 1 a day  Orders: EKG w/ Interpretation (93000) T-2 View CXR (71020TC) Nuclear Stress Test (Nuc Stress Test)  Problem # 2:  CAROTID ARTERY DISEASE (ICD-433.10) Will need followup duplex in April 2012 for surveillance of mild-moderate carotid disease.  Her updated medication list for this problem includes:    Aspirin 81 Mg Tbec (Aspirin) .Marland Kitchen... 1 a day  Orders: EKG w/ Interpretation (93000) T-2 View CXR (71020TC) Nuclear Stress Test (Nuc Stress Test) Carotid Duplex (Carotid Duplex)  Problem # 3:  OTHER DYSPNEA AND RESPIRATORY ABNORMALITIES (ICD-786.09) Will check a CXR in settin of dyspnea and smoking history.  Her updated medication list for this problem includes:    Aspirin  81 Mg Tbec (Aspirin) .Marland Kitchen... 1 a day    Diovan 80 Mg Tabs (Valsartan) .Marland Kitchen... Take one tablet by mouth daily    Hydrochlorothiazide 25 Mg Tabs (Hydrochlorothiazide) .Marland Kitchen... 1 a day  Orders: EKG w/ Interpretation (93000) T-2 View CXR (71020TC) Nuclear Stress Test (Nuc Stress Test)  Patient Instructions: 1)  Your physician recommends that you schedule a follow-up appointment in: 1 MONTH 2)  Your physician recommends that you continue on your current medications as directed. Please refer to the Current Medication list given to you today. 3)  Your physician has requested that you have an exercise stress myoview.  For further information please visit [email or web address]  Please follow instruction sheet, as given. 4)  Chest x-ray will be obtained today.  5)  Your physician has requested that you have a carotid duplex in APRIL 2012. This test is an ultrasound of the carotid arteries in your neck. It looks at blood flow through these arteries that supply the brain with blood. Allow one hour for this exam. There are no restrictions or special instructions.

## 2010-09-11 NOTE — Progress Notes (Signed)
Summary: call from gynecology,?PROLIA  Phone Note Other Incoming   Summary of Call: phone call from Dr. Lily Peer, patient's gynecologist He is worried about her risk for fractures. We stop the Actonel based on a bone density test 9-11 He suggests possibly PROLIA Plan: --fax Dr. Lily Peer the bone density test from 9-11 and the last vitamin D results --either he or me are going to discuss PROLIA at the next office visit Jose E. Paz MD  July 12, 2010 10:06 AM   Follow-up for Phone Call        Faxed over Vitamin D Results and Bone Density.  Follow-up by: [name] CMA,  July 12, 2010 10:10 AM

## 2010-09-11 NOTE — Assessment & Plan Note (Signed)
Summary: one mth fu/ns/kdc   Vital Signs:  Patient profile:   70 year old female Height:      63 inches Weight:      116 pounds Pulse rate:   92 / minute BP sitting:   110 / 60  Vitals Entered By: Shary Decamp (October 08, 2009 2:41 PM) CC: rov - FYI - went to [facility] ED last night with ear pain, was rx'd abx drops   History of Present Illness:  went to [facility] ED last night with ear pain, patient reports she had a r ear lavage and  was rx'd abx drops her strep test was negative over all feels better  Current Medications (verified): 1)  Actonel 150 Mg Tabs (Risedronate Sodium) .... Qmo 2)  Simvastatin 20 Mg  Tabs (Simvastatin) .Marland Kitchen.. 1 By Mouth Qhs 3)  Aspirin 81 Mg  Tbec (Aspirin) .Marland Kitchen.. 1 A Day 4)  Diovan 80 Mg Tabs (Valsartan) .... Take One Tablet By Mouth Daily 5)  Fluoxetine Hcl 20 Mg Tabs (Fluoxetine Hcl) .... Once Daily 6)  Hydrochlorothiazide 25 Mg Tabs (Hydrochlorothiazide) .Marland Kitchen.. 1 A Day 7)  Calcium Carbonate-Vitamin D 600-400 Mg-Unit  Tabs (Calcium Carbonate-Vitamin D) .... Take 1 Tablet By Mouth Two Times A Day 8)  Aleve 220 Mg Tabs (Naproxen Sodium) .... As Needed 9)  Simply Sleep 25 Mg Tabs (Diphenhydramine Hcl (Sleep)) .... As Needed  Allergies (verified): No Known Drug Allergies  Past History:  Past Medical History: CAROTID ARTERY DISEASE-- moderate asymptomatic Osteopenia COPD Hyperlipidemia Thyroid nodule TOBACCO ABUSE abnormal CXR 7-09 f/u by a normal CT chest LIVER FUNCTION TESTS, ABNORMAL  Depression Hypertension, intolerant to ACEi (cough)  Past Surgical History: Reviewed history from 09/12/2009 and no changes required. h/o "floating kidney " 1960 Hysterectomy (08/11/1973), NO oophorectomy  Review of Systems       denies fevers, runny nose or sore throat no sinus congestion also, as far as her depression, she feels better, she was recently switched to Prozac  Physical Exam  General:  alert and well-developed.   Ears:  L ear normal.   right ear  with wax, unable to see much Nose:  slightly congested Lungs:  normal respiratory effort, no intercostal retractions, no accessory muscle use, and decreased breath sounds   Impression & Recommendations:  Problem # 1:  CERUMEN IMPACTION (ICD-380.4)  we repeated the  lavage  today,a large amount of cerumen was removed. TM seems  thick and red but intact Will prescribe ciprodex  Orders: Cerumen Impaction Removal (16109)  Problem # 2:  EXTERNAL OTITIS (ICD-380.10) see #1 Her updated medication list for this problem includes:    Ciprodex 0.3-0.1 % Susp (Ciprofloxacin-dexamethasone) .Marland KitchenMarland KitchenMarland KitchenMarland Kitchen 3 drops in the right ear twice a day for one week  Problem # 3:  DEPRESSION (ICD-311) doing  better, refilled fluoxetine Her updated medication list for this problem includes:    Fluoxetine Hcl 20 Mg Tabs (Fluoxetine hcl) ..... Once daily  Complete Medication List: 1)  Actonel 150 Mg Tabs (Risedronate sodium) .... Qmo 2)  Simvastatin 20 Mg Tabs (Simvastatin) .Marland Kitchen.. 1 by mouth qhs 3)  Aspirin 81 Mg Tbec (Aspirin) .Marland Kitchen.. 1 a day 4)  Diovan 80 Mg Tabs (Valsartan) .... Take one tablet by mouth daily 5)  Fluoxetine Hcl 20 Mg Tabs (Fluoxetine hcl) .... Once daily 6)  Hydrochlorothiazide 25 Mg Tabs (Hydrochlorothiazide) .Marland Kitchen.. 1 a day 7)  Calcium Carbonate-vitamin D 600-400 Mg-unit Tabs (Calcium carbonate-vitamin d) .... Take 1 tablet by mouth two times a day 8)  Aleve  220 Mg Tabs (Naproxen sodium) .... As needed 9)  Simply Sleep 25 Mg Tabs (Diphenhydramine hcl (sleep)) .... As needed 10)  Ciprodex 0.3-0.1 % Susp (Ciprofloxacin-dexamethasone) .... 3 drops in the right ear twice a day for one week  Patient Instructions: 1)  Please schedule a follow-up appointment in 2 to 3 months (fasting, yearly checkup) Prescriptions: CIPRODEX 0.3-0.1 % SUSP (CIPROFLOXACIN-DEXAMETHASONE) 3 drops in the right ear twice a day for one week  #1 x 0   Entered and Authorized by:   Nolon Rod. Paz MD   Signed by:   Nolon Rod. Paz MD on  10/08/2009   Method used:   Print then Give to Patient   RxID:   [id number] HYDROCHLOROTHIAZIDE 25 MG TABS (HYDROCHLOROTHIAZIDE) 1 a day  #90 x 1   Entered by:   Shary Decamp   Authorized by:   Nolon Rod. Paz MD   Signed by:   Shary Decamp on 10/08/2009   Method used:   Print then Give to Patient   RxID:   [id number] FLUOXETINE HCL 20 MG TABS (FLUOXETINE HCL) once daily  #90 x 1   Entered by:   Shary Decamp   Authorized by:   Nolon Rod. Paz MD   Signed by:   Shary Decamp on 10/08/2009   Method used:   Print then Give to Patient   RxID:   [id number]

## 2010-09-11 NOTE — Consult Note (Signed)
Summary: [facility]  [facility]   Imported By: Lanelle Bal 06/12/2010 12:13:58  _____________________________________________________________________  External Attachment:    Type:   Image     Comment:   External Document

## 2010-09-11 NOTE — Miscellaneous (Signed)
Summary: Appointment Canceled  Appointment status changed to canceled by [employer] on 07/11/2010 8:04 AM.  Cancellation Comments --------------------- wt 119/c r/s/786.50/mcr/bcbs prec. req/saf  Appointment Information ----------------------- Appt Type:  CARDIOLOGY NUCLEAR TESTING      Date:  Thursday, July 11, 2010      Time:  9:45 AM for 15 min   Urgency:  Routine   Made By:  Pearson Grippe  To Visit:  [id number]    Reason:  wt 119/c r/s/786.50/mcr/bcbs prec. req/saf  Appt Comments ------------- -- 07/11/10 8:04: ([facility]) CANCELED -- wt 119/c r/s/786.50/mcr/bcbs prec. req/saf -- 07/05/10 10:11: ([facility]) BOOKED -- Routine CARDIOLOGY NUCLEAR TESTING at 07/11/2010 9:45 AM for 15 min wt 119/c r/s/786.50/mcr/bcbs prec. req/saf -- 07/02/10 10:22: ([facility]

## 2010-09-11 NOTE — Consult Note (Signed)
Summary: [facility]  [facility]   Imported By: Lanelle Bal 05/30/2010 16:08:25  _____________________________________________________________________  External Attachment:    Type:   Image     Comment:   External Document

## 2010-09-11 NOTE — Assessment & Plan Note (Signed)
Summary: ROV/POSITIVE NUC/DM   Primary Provider:  Nolon Rod. Paz MD   History of Present Illness: Patricia [name] is a vascular paitent of Dr Excell Seltzer added on to my schedule for markedly positvie myovue.  She exercised for 4:33 and had over 2 mmm of ST segment depression with a large area of inferolateral ischemia from apex to base.  Patient initially seen for F/U carorotid disease.    Last carotid duplex 11/16/2009 showed 40-59% bilateral ICA stenosis.  She complained of exertional dyspnea with low-level activity, newly recognized over the past 2-3 weeks. She also describes a pain in her chest and upper back, predominately in the mornings, also over several weeks. She denies exertional chest pain. No edema, palpitations, or syncope. No other complaints. Because of these complaints she was set up for a myovue by Dr Excell Seltzer  I discussed the case with him and he is fine to have Dr Juanda Chance cath patient in am  Reviewed the myovue with patient and discussed risks of cath.  Her sister is a patient of Dr Juanda Chance and she is comfortable having him do the procedure.  She has a CXR 11/22 which had NAD which I reviewed and she does not need another one.  Labs will be done today.  Current Problems (verified): 1)  Chest Pain, Precordial  (ICD-786.51) 2)  Other Dyspnea and Respiratory Abnormalities  (ICD-786.09) 3)  Ankle Pain, Left  (ICD-719.47) 4)  Allergic Rhinitis  (ICD-477.9) 5)  Carotid Artery Disease  (ICD-433.10) 6)  Hypertension  (ICD-401.9) 7)  Hyperlipidemia, Borderline  (ICD-272.4) 8)  Depression  (ICD-311) 9)  Liver Function Tests, Abnormal  (ICD-794.8) 10)  Tobacco Abuse  (ICD-305.1) 11)  Skin Lesion  (ICD-709.9) 12)  Health Screening  (ICD-V70.0) 13)  COPD  (ICD-496) 14)  Osteopenia  (ICD-733.90) 15)  Weight Gain  (ICD-783.1) 16)  Family History of Cad Female 1st Degree Relative <50  (ICD-V17.3) 17)  Thyroid Nodule  (ICD-241.0)  Current Medications (verified): 1)  Simvastatin 20 Mg  Tabs (Simvastatin)  .Marland Kitchen.. 1 By Mouth Qhs 2)  Aspirin 81 Mg  Tbec (Aspirin) .Marland Kitchen.. 1 A Day 3)  Diovan 80 Mg Tabs (Valsartan) .... Take One Tablet By Mouth Daily 4)  Hydrochlorothiazide 25 Mg Tabs (Hydrochlorothiazide) .Marland Kitchen.. 1 A Day 5)  Calcium Carbonate-Vitamin D 600-400 Mg-Unit  Tabs (Calcium Carbonate-Vitamin D) .... Take 1 Tablet By Mouth Two Times A Day 6)  Aleve 220 Mg Tabs (Naproxen Sodium) .... As Needed 7)  Simply Sleep 25 Mg Tabs (Diphenhydramine Hcl (Sleep)) .... As Needed 8)  Claritin 10 Mg Caps (Loratadine) .... One A Day As Needed For Allergies 9)  Flonase 50 Mcg/act Susp (Fluticasone Propionate) .... 2  Sprays in Each Side of The Nose Once Daily  Allergies (verified): No Known Drug Allergies  Past History:  Past Medical History: Last updated: 04/08/2010 CAROTID ARTERY DISEASE-- moderate asymptomatic Osteopenia COPD Hyperlipidemia Thyroid nodule TOBACCO ABUSE abnormal CXR 7-09 f/u by a normal CT chest LIVER FUNCTION TESTS, ABNORMAL  Depression Hypertension, intolerant to ACEi (cough) Allergic rhinitis  Past Surgical History: Last updated: 09/12/2009 h/o "floating kidney " 1960 Hysterectomy (08/11/1973), NO oophorectomy  Family History: Last updated: 09/12/2009 CAD--Father HTN--Mother  The patient's brother died of an MI at age 18.  She has   a sister who has a permanent pacemaker at age 42.  There is no history  of stroke in the family.  colon ca--no breast cancer --no     Social History: Last updated: 04/08/2010 retired from office work since  2003.  Widow tobbacco-- smoke  for approximately 50  years, currently 1/2 ppd  alcohol-- socially      Review of Systems       Denies fever, malais, weight loss, blurry vision, decreased visual acuity, cough, sputum, SOB, hemoptysis, pleuritic pain, palpitaitons, heartburn, abdominal pain, melena, lower extremity edema, claudication, or rash.   Vital Signs:  Patient profile:   70 year old female Pulse rate:   79 / minute Resp:      12 per minute BP sitting:   135 / 62  Vitals Entered By: Deliah Goody, RN (July 16, 2010 1:03 PM)  Physical Exam  General:  Affect appropriate Healthy:  appears stated age HEENT: normal Neck supple with no adenopathy JVP normal bilateral carotid  bruits no thyromegaly Lungs clear with no wheezing and good diaphragmatic motion Heart:  S1/S2 no murmur,rub, gallop or click PMI normal Abdomen: benighn, BS positve, no tenderness, no AAA bilateral femoral  bruit.  No HSM or HJR Distal pulses intact with no bruits No edema Neuro non-focal Skin warm and dry    Impression & Recommendations:  Problem # 1:  CHEST PAIN, PRECORDIAL (ICD-786.51) Markedly positive myovue.  CAth in am with Brodie.  No Plavix given posibiliy of multivessel disease.  Can use effient or cangrelor Continue ASA  Her updated medication list for this problem includes:    Aspirin 81 Mg Tbec (Aspirin) .Marland Kitchen... 1 a day  Orders: TLB-CBC Platelet - w/Differential (85025-CBCD) TLB-PT (Protime) (85610-PTP)  Problem # 2:  CAROTID ARTERY DISEASE (ICD-433.10) Moderate bilateral disease  F/U duplex in 6 months Her updated medication list for this problem includes:    Aspirin 81 Mg Tbec (Aspirin) .Marland Kitchen... 1 a day  Problem # 3:  HYPERTENSION (ICD-401.9) Well controlled  Continue ACE and Durectic Her updated medication list for this problem includes:    Aspirin 81 Mg Tbec (Aspirin) .Marland Kitchen... 1 a day    Diovan 80 Mg Tabs (Valsartan) .Marland Kitchen... Take one tablet by mouth daily    Hydrochlorothiazide 25 Mg Tabs (Hydrochlorothiazide) .Marland Kitchen... 1 a day  Orders: TLB-BMP (Basic Metabolic Panel-BMET) (80048-METABOL)  Problem # 4:  HYPERLIPIDEMIA, BORDERLINE (ICD-272.4) Likely increase simva to 40 mg or change to generic lipitor for LDL goal of 70 or less Her updated medication list for this problem includes:    Simvastatin 20 Mg Tabs (Simvastatin) .Marland Kitchen... 1 by mouth qhs  CHOL: 183 (04/08/2010)   LDL: 95 (04/08/2010)   HDL: 73.40  (04/08/2010)   TG: 74.0 (04/08/2010)  Problem # 5:  TOBACCO ABUSE (ICD-305.1) Counseled for less than 10 minutes on smoking cesseation.  Post cath [facility] to [facility] cessation at [facility]  Patient Instructions: 1)  Your physician recommends that you schedule a follow-up appointment WITH DR Excell Seltzer AFTER CATH 2)  Your physician has requested that you have a cardiac catheterization.  Cardiac catheterization is used to diagnose and/or treat various heart conditions. Doctors may recommend this procedure for a number of different reasons. The most common reason is to evaluate chest pain. Chest pain can be a symptom of coronary artery disease (CAD), and cardiac catheterization can show whether plaque is narrowing or blocking your heart's arteries. This procedure is also used to evaluate the valves, as well as measure the blood flow and oxygen levels in different parts of your heart.  For further information please visit [email or web address]  Please follow instruction sheet, as given.

## 2010-09-11 NOTE — Assessment & Plan Note (Signed)
Summary: SPRAINED ANKLE?, SWOLLEN  //SPH   Vital Signs:  Patient profile:   70 year old female Weight:      120.13 pounds Pulse rate:   86 / minute Pulse rhythm:   regular BP sitting:   122 / 78  (left arm) Cuff size:   regular  Vitals Entered By: [name] CMA (May 09, 2010 3:19 PM) CC: (L) Ankle pain Comments swollen, red x 1 day unsure of injury [facility] [address]   History of Present Illness: pain and swelling at the left ankle x 1  day She has been doing a lot of walking but no actual injury  had an abnormal mammogram few days ago, patient reports that had a followup mammogram yesterday and she was told it was okay  ROS No fever Mild redness around the ankle   Current Medications (verified): 1)  Actonel 150 Mg Tabs (Risedronate Sodium) .... Qmo 2)  Simvastatin 20 Mg  Tabs (Simvastatin) .Marland Kitchen.. 1 By Mouth Qhs 3)  Aspirin 81 Mg  Tbec (Aspirin) .Marland Kitchen.. 1 A Day 4)  Diovan 80 Mg Tabs (Valsartan) .... Take One Tablet By Mouth Daily 5)  Hydrochlorothiazide 25 Mg Tabs (Hydrochlorothiazide) .Marland Kitchen.. 1 A Day 6)  Calcium Carbonate-Vitamin D 600-400 Mg-Unit  Tabs (Calcium Carbonate-Vitamin D) .... Take 1 Tablet By Mouth Two Times A Day 7)  Aleve 220 Mg Tabs (Naproxen Sodium) .... As Needed 8)  Simply Sleep 25 Mg Tabs (Diphenhydramine Hcl (Sleep)) .... As Needed 9)  Claritin 10 Mg Caps (Loratadine) .... One A Day As Needed For Allergies 10)  Flonase 50 Mcg/act Susp (Fluticasone Propionate) .... 2  Sprays in Each Side of The Nose Once Daily 11)  Vitamin D (Ergocalciferol) 50000 Unit Caps (Ergocalciferol) .Marland Kitchen.. 1 By Mouth Weekly For 3 Months.  Allergies (verified): No Known Drug Allergies  Past History:  Past Medical History: Reviewed history from 04/08/2010 and no changes required. CAROTID ARTERY DISEASE-- moderate asymptomatic Osteopenia COPD Hyperlipidemia Thyroid nodule TOBACCO ABUSE abnormal CXR 7-09 f/u by a normal CT chest LIVER FUNCTION TESTS, ABNORMAL    Depression Hypertension, intolerant to ACEi (cough) Allergic rhinitis  Past Surgical History: Reviewed history from 09/12/2009 and no changes required. 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 retired from office work since 2003.  Widow tobbacco-- smoke  for approximately 50  years, currently 1/2 ppd  alcohol-- socially      Physical Exam  General:  alert and well-developed.   Pulses:  normal pedal pulses bilaterally Extremities:  right foot and ankle normal left foot normal Left ankle slightly puffy around  the external malleolus, slightly tender to pressure distal from the external malleolus. Area is slightly warm and erythematous    Impression & Recommendations:  Problem # 1:  ANKLE PAIN, LEFT (ICD-719.47)  left ankle swelling, slightly warm. Recently had 2 episodes of left foot pain, question of gout, uric acid was normal. Sprain? Doubt localized cellulitis Plan: X-ray ice see instructions   Orders: T-Ankle Comp Left Min 3 Views (73610TC)  Complete Medication List: 1)  Actonel 150 Mg Tabs (Risedronate sodium) .... Qmo 2)  Simvastatin 20 Mg Tabs (Simvastatin) .Marland Kitchen.. 1 by mouth qhs 3)  Aspirin 81 Mg Tbec (Aspirin) .Marland Kitchen.. 1 a day 4)  Diovan 80 Mg Tabs (Valsartan) .... Take one tablet by mouth daily 5)  Hydrochlorothiazide 25 Mg Tabs (Hydrochlorothiazide) .Marland Kitchen.. 1 a day 6)  Calcium Carbonate-vitamin D 600-400 Mg-unit Tabs (Calcium carbonate-vitamin d) .Marland KitchenMarland KitchenMarland Kitchen  Take 1 tablet by mouth two times a day 7)  Aleve 220 Mg Tabs (Naproxen sodium) .... As needed 8)  Simply Sleep 25 Mg Tabs (Diphenhydramine hcl (sleep)) .... As needed 9)  Claritin 10 Mg Caps (Loratadine) .... One a day as needed for allergies 10)  Flonase 50 Mcg/act Susp (Fluticasone propionate) .... 2  sprays in each side of the nose once daily 11)  Vitamin D (ergocalciferol) 50000 Unit Caps (Ergocalciferol)  .Marland Kitchen.. 1 by mouth weekly for 3 months.  Other Orders: Flu Vaccine 51yrs + MEDICARE PATIENTS (Z3664) Administration Flu vaccine - MCR (Q0347)  Patient Instructions: 1)  elevated leg 2)  ice 2  times a day 3)  Aleve twice a day with food 4)  Call if not better next week, call any time if symptoms worsen Flu Vaccine Consent Questions     Do you have a history of severe allergic reactions to this vaccine? no    Any prior history of allergic reactions to egg and/or gelatin? no    Do you have a sensitivity to the preservative Thimersol? no    Do you have a past history of Guillan-Barre Syndrome? no    Do you currently have an acute febrile illness? no    Have you ever had a severe reaction to latex? no    Vaccine information given and explained to patient? yes    Are you currently pregnant? no    Lot Number:[id number]   Exp Date:02/08/2011   Site Given  Left Deltoid IMdflu

## 2010-09-11 NOTE — Assessment & Plan Note (Signed)
Summary: acute/dry cough, ears alr   Vital Signs:  Patient profile:   70 year old female Height:      63 inches Weight:      116 pounds Temp:     99.4 degrees F BP sitting:   100 / 60  Vitals Entered By: Shary Decamp (October 16, 2009 11:29 AM) CC: dry, tickle in throat.  +dry cough -- pt has similar sxs every spring, summer   History of Present Illness: dry, tickle in throat.  +dry cough -- pt has similar sxs every spring, summer this year, symptoms started gradually last month she does have a  PN drip    Current Medications (verified): 1)  Actonel 150 Mg Tabs (Risedronate Sodium) .... Qmo 2)  Simvastatin 20 Mg  Tabs (Simvastatin) .Marland Kitchen.. 1 By Mouth Qhs 3)  Aspirin 81 Mg  Tbec (Aspirin) .Marland Kitchen.. 1 A Day 4)  Diovan 80 Mg Tabs (Valsartan) .... Take One Tablet By Mouth Daily 5)  Fluoxetine Hcl 20 Mg Tabs (Fluoxetine Hcl) .... Once Daily 6)  Hydrochlorothiazide 25 Mg Tabs (Hydrochlorothiazide) .Marland Kitchen.. 1 A Day 7)  Calcium Carbonate-Vitamin D 600-400 Mg-Unit  Tabs (Calcium Carbonate-Vitamin D) .... Take 1 Tablet By Mouth Two Times A Day 8)  Aleve 220 Mg Tabs (Naproxen Sodium) .... As Needed 9)  Simply Sleep 25 Mg Tabs (Diphenhydramine Hcl (Sleep)) .... As Needed 10)  Ciprodex 0.3-0.1 % Susp (Ciprofloxacin-Dexamethasone) .... 3 Drops in The Right Ear Twice A Day For One Week  Allergies (verified): No Known Drug Allergies  Past History:  Past Medical History: CAROTID ARTERY DISEASE-- moderate asymptomatic Osteopenia COPD Hyperlipidemia Thyroid nodule TOBACCO ABUSE abnormal CXR 7-09 f/u by a normal CT chest LIVER FUNCTION TESTS, ABNORMAL  Depression Hypertension, intolerant to ACEi (cough) Allergic rhinitis  Past Surgical History: Reviewed history from 09/12/2009 and no changes required. 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   Eyes:  no redness Ears:  L ear normal.   right ear TM  slt red, no discharge Nose:  no congested Mouth:  no discharge Neck:  no mass, neck  is symmetric to palpation Lungs:  normal respiratory effort, no intercostal retractions, no accessory muscle use, and decreased breath sounds   Impression & Recommendations:  Problem # 1:  ALLERGIC RHINITIS (ICD-477.9)  symptoms consistent with allergic rhinitis seen instructions  Her updated medication list for this problem includes:    Claritin 10 Mg Caps (Loratadine) ..... One a day as needed for allergies    Flonase 50 Mcg/act Susp (Fluticasone propionate) .Marland Kitchen... 2  sprays in each side of the nose once daily  Problem # 2:  EXTERNAL OTITIS (ICD-380.10) TM not complytely normal, treatment of allergies should help still has a occasional dyscomfort, will call if no better soon ENT? The following medications were removed from the medication list:    Ciprodex 0.3-0.1 % Susp (Ciprofloxacin-dexamethasone) .Marland KitchenMarland KitchenMarland KitchenMarland Kitchen 3 drops in the right ear twice a day for one week  Complete Medication List: 1)  Actonel 150 Mg Tabs (Risedronate sodium) .... Qmo 2)  Simvastatin 20 Mg Tabs (Simvastatin) .Marland Kitchen.. 1 by mouth qhs 3)  Aspirin 81 Mg Tbec (Aspirin) .Marland Kitchen.. 1 a day 4)  Diovan 80 Mg Tabs (Valsartan) .... Take one tablet by mouth daily 5)  Fluoxetine Hcl 20 Mg Tabs (Fluoxetine hcl) .... Once daily 6)  Hydrochlorothiazide 25 Mg Tabs (Hydrochlorothiazide) .Marland Kitchen.. 1 a day 7)  Calcium Carbonate-vitamin D 600-400 Mg-unit Tabs (Calcium carbonate-vitamin d) .... Take 1 tablet by mouth two times a day 8)  Aleve 220 Mg Tabs (Naproxen sodium) .... As needed 9)  Simply Sleep 25 Mg Tabs (Diphenhydramine hcl (sleep)) .... As needed 10)  Claritin 10 Mg Caps (Loratadine) .... One a day as needed for allergies 11)  Flonase 50 Mcg/act Susp (Fluticasone propionate) .... 2  sprays in each  side of the nose once daily  Patient Instructions: 1)  Flonase daily, indefinitely 2)  claritin  10 mg over-the-counter one every day 3)  call if no better in few weeks Prescriptions: FLONASE 50 MCG/ACT SUSP (FLUTICASONE PROPIONATE) 2  sprays in each side of the nose once daily  #1 x 12   Entered and Authorized by:   Jaishawn Witzke E. Fionna Merriott MD   Signed by:   Nolon Rod. Edwards Mckelvie MD on 10/16/2009   Method used:   Print then Give to Patient   RxID:   [id number]

## 2010-09-11 NOTE — Miscellaneous (Signed)
Summary: Orders Update  Clinical Lists Changes  Orders: Added new Test order of Carotid Duplex (Carotid Duplex) - Signed 

## 2010-09-11 NOTE — Assessment & Plan Note (Signed)
Summary: rov/swh   Vital Signs:  Patient profile:   70 year old female Height:      63 inches Weight:      116.4 pounds Pulse rate:   70 / minute BP sitting:   120 / 80  Vitals Entered By: Shary Decamp (September 03, 2009 3:54 PM) CC: rov   History of Present Illness:  dry cough mostly in the morning for few months, wonders if it is related with citalopram, see review of systems  depression--we recently increased her citalopram dose, she is still not completely satisfied with the results of the medication, Prozac?  the reason she is not completely satisfied is b/c she is mildly fatigued, not as talkative as usual  Hypertension--just added HCTZ., BP normal today,  no apparent side effects  Current Medications (verified): 1)  Actonel 150 Mg Tabs (Risedronate Sodium) .... Qmo 2)  Simvastatin 20 Mg  Tabs (Simvastatin) .Marland Kitchen.. 1 By Mouth Qhs 3)  Aspirin 81 Mg  Tbec (Aspirin) .Marland Kitchen.. 1 A Day 4)  Calcium 1200mg  and Vit D 600 Units .... Every Day 5)  Diovan 80 Mg Tabs (Valsartan) .... Take One Tablet By Mouth Daily 6)  Alleve 220mg  .... As Needed 7)  Simply Sleep 25mg  .... As Needed 8)  Citalopram Hydrobromide 20 Mg Tabs (Citalopram Hydrobromide) .Marland Kitchen.. 1 1/2 By Mouth Once Daily 9)  Hydrochlorothiazide 25 Mg Tabs (Hydrochlorothiazide) .Marland Kitchen.. 1 A Day  Allergies (verified): No Known Drug Allergies  Past History:  Past Medical History: Osteopenia COPD Hyperlipidemia Carotid disease Thyroid nodule TOBACCO ABUSE abnormal CXR 7-09 f/u by a normal CT chest LIVER FUNCTION TESTS, ABNORMAL  Depression Hypertension, intolerant to ACEi (cough)  Past Surgical History: Reviewed history from 01/12/2008 and no changes required. h/o "floating kidney " 1960 Hysterectomy (08/11/1973), NO oophorectomy  Social History:  patient moved to [place] in 2006 aprox. 70 y/o [place] lives w/ her has a sister in town and other family members  Widow, lost her husband in  2006.   lost a son to suicide  2010 tobacco-- aprox 1 ppd ETOH-- socially   Review of Systems       denies postnasal dripping, no sore throat, no sputum production. No wheezing no GERD symptoms  Physical Exam  General:  alert and well-developed.   Lungs:  normal respiratory effort, no intercostal retractions, no accessory muscle use, and normal breath sounds.   Heart:  normal rate, regular rhythm, and no murmur.   Psych:  Oriented X3, normally interactive, good eye contact, and not anxious appearing.  Seems stable   Impression & Recommendations:  Problem # 1:  DEPRESSION (ICD-311)  better but not completely well , wishes to try Prozac I think that is reasonable since she is somehow fatigued instructions  Her updated medication list for this problem includes:    Fluoxetine Hcl 20 Mg Tabs (Fluoxetine hcl) ..... Half tablet for 10 days, then take one tablet a day  Orders: Prescription Created Electronically [phone])  Problem # 2:  HYPERTENSION (ICD-401.9) BP has improved with the addition of hydrochlorothiazide. BMP on return to the office she has mild dry cough, related to Diovan? the patient is willing to wait and see how she does w/ the cough  Her updated medication list for this problem includes:    Diovan 80 Mg Tabs (Valsartan) .Marland Kitchen... Take one tablet by mouth daily    Hydrochlorothiazide 25 Mg Tabs (Hydrochlorothiazide) .Marland Kitchen... 1 a day  BP today: 120/80 Prior BP: 164/80 (07/20/2009)  Labs Reviewed: K+: 4.2 (  07/20/2009) Creat: : 0.6 (07/20/2009)   Chol: 176 (07/20/2009)   HDL: 65.70 (07/20/2009)   LDL: 91 (07/20/2009)   TG: 97.0 (07/20/2009)  Problem # 3:  TOBACCO ABUSE (ICD-305.1) counseled   Problem # 4:  HEALTH SCREENING (ICD-V70.0) due for a yearly checkup, patient aware  Complete Medication List: 1)  Actonel 150 Mg Tabs (Risedronate sodium) .... Qmo 2)  Simvastatin 20 Mg Tabs (Simvastatin) .Marland Kitchen.. 1 by mouth qhs 3)  Aspirin 81 Mg Tbec (Aspirin) .Marland Kitchen.. 1 a day 4)  Calcium 1200mg  and Vit D 600  Units  .... Every day 5)  Diovan 80 Mg Tabs (Valsartan) .... Take one tablet by mouth daily 6)  Alleve 220mg   .... As needed 7)  Simply Sleep 25mg   .... As needed 8)  Fluoxetine Hcl 20 Mg Tabs (Fluoxetine hcl) .... Half tablet for 10 days, then take one tablet a day 9)  Hydrochlorothiazide 25 Mg Tabs (Hydrochlorothiazide) .Marland Kitchen.. 1 a day  Patient Instructions: 1)  for the next 10 days:  take half tablet of citalopram and start half tablet of fluoxetine 2)  after 10 days, discontinue citalopram and take a whole  fluoxetine tablet 3)  Please schedule a follow-up appointment in 1 month.  Prescriptions: FLUOXETINE HCL 20 MG TABS (FLUOXETINE HCL) half tablet for 10 days, then take one tablet a day  #30 x 1   Entered and Authorized by:   [name] E. Alyssamae Klinck MD   Signed by:   Nolon Rod. Mayo Owczarzak MD on 09/03/2009   Method used:   Electronically to        [facility] [address]* (retail)       [address] [place], [place]  [address]       Ph: [phone]       Fax: [phone]   RxID:   [id number]

## 2010-09-11 NOTE — Letter (Signed)
Summary: Cardiac Catheterization Instructions- [facility]  [facility], Main Office  [address]   [place], [place] [address]   Phone: [phone]  Fax: [phone]     07/16/2010 MRN: [id number]  DEVANIE GALANTI [address] [place], [place]  [address]  Dear Ms. Pevey,   You are scheduled for a Cardiac Catheterization on [facility] 07-17-10 with Dr. Juanda Chance  Please arrive to the 1st floor of the [facility] V[facility] Center at [facility] at      10:30 am        on the day of your procedure. Please do not arrive before 6:30 a.m. Call the Heart and Vascular Center at [phone] if you are unable to make your appointmnet. The Code to get into the parking garage under the building is 0003. Take the elevators to the 1st floor. You must have someone to drive you home. Someone must be with you for the first 24 hours after you arrive home. Please wear clothes that are easy to get on and off and wear slip-on shoes. Do not eat or drink after midnight except water with your medications that morning. Bring all your medications and current insurance cards with you.  _XX__ DO NOT take these medications before your procedure: DO NOT TAKE HYDROCHLORITHIAZIDE TOMORROW MORNING  ___ Make sure you take your aspirin.  ___ You may take ALL of your medications with water that morning. ________________________________________________________________________________________________________________________________  ___ DO NOT take ANY medications before your procedure.  ___ Pre-med instructions:  ________________________________________________________________________________________________________________________________  The usual length of stay after your procedure is 2 to 3 hours. This can vary.  If you have any questions, please call the office at the number listed above.   Deliah Goody, RN

## 2010-09-12 NOTE — Assessment & Plan Note (Signed)
Summary: per check out/sf   Visit Type:  Follow-up Primary Markon Jares:  Nolon Rod. Paz MD  CC:  none.  History of Present Illness: 70 year-old woman presenting for followup of CAD after developing symptoms of unstable angina. She was treated with PCI of the left circumflex earlier this month by Dr Juanda Chance. A drug-eluting stent platform was used.  The patient reports resolution of her angina since the PCI procedure was performed. She continues to have focal, fleeting left sided chest pain, unrelated to exertion. No other complaints today. She reports reduction on cigarettes, down from 1ppd to 5 cigs per day with a 21 mg nicotine patch.    Current Medications (verified): 1)  Simvastatin 20 Mg  Tabs (Simvastatin) .Marland Kitchen.. 1 By Mouth Qhs 2)  Aspirin 325 Mg Tabs (Aspirin) .Marland Kitchen.. 1 By Mouth Once Daily 3)  Diovan 80 Mg Tabs (Valsartan) .... Take One Tablet By Mouth Daily 4)  Hydrochlorothiazide 25 Mg Tabs (Hydrochlorothiazide) .Marland Kitchen.. 1 A Day 5)  Calcium Carbonate-Vitamin D 600-400 Mg-Unit  Tabs (Calcium Carbonate-Vitamin D) .... Take 1 Tablet By Mouth Two Times A Day 6)  Simply Sleep 25 Mg Tabs (Diphenhydramine Hcl (Sleep)) .... As Needed 7)  Claritin 10 Mg Caps (Loratadine) .... One A Day As Needed For Allergies 8)  Flonase 50 Mcg/act Susp (Fluticasone Propionate) .... 2  Sprays in Each Side of The Nose Once Daily 9)  Plavix 75 Mg Tabs (Clopidogrel Bisulfate) .Marland Kitchen.. 1 By Mouth Once Daily With Food 10)  Nitrostat 0.4 Mg Subl (Nitroglycerin) .... Dissolve 1 Tablet Under The Tongue Every 5 Mins As Needed. Max 3 Times  Allergies (verified): No Known Drug Allergies  Past History:  Past medical history reviewed for relevance to current acute and chronic problems.  Past Medical History: Reviewed history from 08/02/2010 and no changes required. CAROTID ARTERY DISEASE-- moderate asymptomatic CAD, dx 07-2010----cath, stent  Osteopenia COPD Hyperlipidemia Thyroid nodule TOBACCO ABUSE abnormal CXR 7-09 f/u  by a normal CT chest LIVER FUNCTION TESTS, ABNORMAL  Depression Hypertension, intolerant to ACEi (cough) Allergic rhinitis  Review of Systems       Negative except as per HPI   Vital Signs:  Patient profile:   70 year old female Height:      63 inches Weight:      121 pounds BMI:     21.51 Pulse rate:   70 / minute Pulse rhythm:   regular Resp:     18 per minute BP sitting:   144 / 74  (left arm) Cuff size:   regular  Vitals Entered By: Vikki Ports (August 08, 2010 9:55 AM)  Physical Exam  General:  Pt is alert and oriented, in no acute distress. HEENT: normal Neck: normal carotid upstrokes with bilaterl carotid bruits right greater than left, JVP normal Lungs: CTA CV: RRR without murmur or gallop Abd: soft, NT, positive BS, no bruit, no organomegaly Ext: no clubbing, cyanosis, or edema.  Skin: warm and dry without rash    EKG  Procedure date:  08/08/2010  Findings:      NSR 70 bpm, within normal limits  Impression & Recommendations:  Problem # 1:  CAD (ICD-414.00) Pt improved after PCI with resolution of her angina. She will continue ASA and plavix for a minimum of 12 months. I reviewed her cath images again and plan on medical therapy for her RCA stenosis. She has collaterals from the left coronary and reports no angina, so medical therapy is warranted.  Pt requests that we make note  of her wishes for DNR.  Her updated medication list for this problem includes:    Aspirin 325 Mg Tabs (Aspirin) .Marland Kitchen... 1 by mouth once daily    Plavix 75 Mg Tabs (Clopidogrel bisulfate) .Marland Kitchen... 1 by mouth once daily with food    Nitrostat 0.4 Mg Subl (Nitroglycerin) .Marland Kitchen... Dissolve 1 tablet under the tongue every 5 mins as needed. 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 Written Rx for valium for before the ultrasound - she has severe claustrophobia. By auscultation the right carotid  sounds more significant.  Her updated medication list for this problem includes:    Aspirin 325 Mg Tabs (Aspirin) .Marland Kitchen... 1 by mouth once daily    Plavix 75 Mg Tabs (Clopidogrel bisulfate) .Marland Kitchen... 1 by mouth once daily with food  Problem # 3:  HYPERLIPIDEMIA, BORDERLINE (ICD-272.4) at goal.  Her updated medication list for this problem includes:    Simvastatin 20 Mg Tabs (Simvastatin) .Marland Kitchen... 1 by mouth qhs  CHOL: 183 (04/08/2010)   LDL: 95 (04/08/2010)   HDL: 73.40 (04/08/2010)   TG: 74.0 (04/08/2010)  Problem # 4:  HYPERTENSION (ICD-401.9) Assessment: Unchanged  Her updated medication list for this problem includes:    Aspirin 325 Mg Tabs (Aspirin) .Marland Kitchen... 1 by mouth once daily    Diovan 80 Mg Tabs (Valsartan) .Marland Kitchen... Take one tablet by mouth daily    Hydrochlorothiazide 25 Mg Tabs (Hydrochlorothiazide) .Marland Kitchen... 1 a day  BP today: 144/74 Prior BP: 120/70 (08/02/2010)  Labs Reviewed: K+: 4.3 (07/16/2010) Creat: : 0.5 (07/16/2010)   Chol: 183 (04/08/2010)   HDL: 73.40 (04/08/2010)   LDL: 95 (04/08/2010)   TG: 74.0 (04/08/2010)  Patient Instructions: 1)  Your physician recommends that you schedule a follow-up appointment in: 6 months.  2)  Your physician recommends that you continue on your current medications as directed. Please refer to the Current Medication list given to you today. Prescriptions: VALIUM 5 MG TABS (DIAZEPAM) please take one pill before your carotid ultrasound.  #2 x 0   Entered by:   Whitney Maeola Sarah RN   Authorized by:   Norva Karvonen, MD   Signed by:   Ellender Hose RN on 08/08/2010   Method used:   Print then Give to Patient   RxID:   [id number]

## 2010-09-12 NOTE — Letter (Signed)
Summary: Results Follow up Letter  [facility] [place]  [address] [place], [place] [address]   Phone: [phone]  Fax: [phone]    09/02/2010 MRN: [id number]  Patricia [name] [address] [place], [place]  [address]  Dear Ms. Early,  The following are the results of your recent test(s):  Test         Result    Pap Smear:        Normal _____  Not Normal _____ Comments: ______________________________________________________ Cholesterol: LDL(Bad cholesterol):         Your goal is less than:         HDL (Good cholesterol):       Your goal is more than: Comments:  ______________________________________________________ Mammogram:        Normal _____  Not Normal _____ Comments:  ___________________________________________________________________ Hemoccult:        Normal ___X__  Not normal _______ Comments:    _____________________________________________________________________ Other Tests:    We routinely do not discuss normal results over the telephone.  If you desire a copy of the results, or you have any questions about this information we can discuss them at your next office visit.   Sincerely,

## 2010-09-12 NOTE — Progress Notes (Signed)
Summary: possible cellulitis  Phone Note Call from Patient Call back at Home Phone [phone]   Summary of Call: Patient left message on triage noting that she had cellulitis back in october of her left foot and ankle. She called ortho and they advised that she call us. Patient thinks something is beginning to happen and would like to either be seen or have ABX called in. Please advise. Initial call taken by: Lucious Groves CMA,  August 16, 2010 3:04 PM  Follow-up for Phone Call        since we cannot see her at this point and she indeed had cellulitis a month ago my plan is as follows: keflex 500  mg one p.o. q.i.d. for 10 days Elevate the leg, ice it, ER if symptoms severe Schedule office visit next week to see me Follow-up by: Kyeshia Zinn E. Jarita Raval MD,  August 16, 2010 5:02 PM  Additional Follow-up for Phone Call Additional follow up Details #1::        Left message on voicemail notifying patient. Lucious Groves CMA  August 16, 2010 5:04 PM     New/Updated Medications: KEFLEX 500 MG CAPS (CEPHALEXIN) one p.o. q.i.d. for 10 days Prescriptions: KEFLEX 500 MG CAPS (CEPHALEXIN) one p.o. q.i.d. for 10 days  #40 x 0   Entered by:   Lucious Groves CMA   Authorized by:   Nolon Rod. Shakeitha Umbaugh MD   Signed by:   Lucious Groves CMA on 08/16/2010   Method used:   Electronically to        [id number] ([facility]ail[facility]       [address] [place], [place]  [address]       Ph: [phone]       Fax: [phone]   RxID:   [id number]

## 2010-09-12 NOTE — Assessment & Plan Note (Signed)
Summary: Patricia [name] & BCBS/RH.....   Vital Signs:  Patient profile:   70 year old female Height:      63 inches Weight:      122. pounds Pulse rate:   72 / minute Pulse rhythm:   regular BP sitting:   120 / 70  (left arm) Cuff size:   regular  Vitals Entered By: Almeta Monas CMA Duncan Dull) (August 02, 2010 2:35 PM) CC: CPX--no problems. Had Heart Stent put in 07/17/10-- wants to discuss Shingles vaccine   History of Present Illness: Here for Medicare AWV:  1.   Risk factors based on Past M, S, F history: reviewed  2.   Physical Activities: only active doing her  ADLs, no exercise per se .Able to go up starirs w/o                    problems  3.   Depression/mood: mood wnl, on no meds  4.   Hearing: no problems reported or noted  5.   ADL's: totally independent  6.   Fall Risk: no recent falls  7.   Home Safety:  does feel safe at home  8.   Height, weight, &visual acuity:see VS, vision seems very good to patient . Uses glasses ; rec                    eye doctor visit , has not been check since 2007 9.   Counseling: yes  10.   Labs ordered based on risk factors: yes  11.           Referral Coordination, if needed  12.           Care Plan, see a/p 13.            Cognitive Assessment: cognition, moptor skills and memory seem appropiate  in addition, we discussed the following chart reviewed---- had a cardiac  cath few days ago , 1 stent. good medication compliance w/ asa , plavix  COPD-- essentially asx, on no inhalers  Hyperlipidemia-- good medication compliance  Thyroid nodule-- last u/s 4-11, next 11-2010  TOBACCO ABUSE-- has cut down to a 1/3 ppd , using a patch       Current Medications (verified): 1)  Simvastatin 20 Mg  Tabs (Simvastatin) .Marland Kitchen.. 1 By Mouth Qhs 2)  Aspirin 325 Mg Tabs (Aspirin) .Marland Kitchen.. 1 By Mouth Once Daily 3)  Diovan 80 Mg Tabs (Valsartan) .... Take One Tablet By Mouth Daily 4)  Hydrochlorothiazide 25 Mg Tabs (Hydrochlorothiazide) .Marland Kitchen.. 1 A  Day 5)  Calcium Carbonate-Vitamin D 600-400 Mg-Unit  Tabs (Calcium Carbonate-Vitamin D) .... Take 1 Tablet By Mouth Two Times A Day 6)  Aleve 220 Mg Tabs (Naproxen Sodium) .... As Needed 7)  Simply Sleep 25 Mg Tabs (Diphenhydramine Hcl (Sleep)) .... As Needed 8)  Claritin 10 Mg Caps (Loratadine) .... One A Day As Needed For Allergies 9)  Flonase 50 Mcg/act Susp (Fluticasone Propionate) .... 2  Sprays in Each Side of The Nose Once Daily 10)  Plavix 75 Mg Tabs (Clopidogrel Bisulfate) .Marland Kitchen.. 1 By Mouth Once Daily With Food 11)  Nitrostat 0.4 Mg Subl (Nitroglycerin) .... Dissolve 1 Tablet Under The Tongue Every 5 Mins As Needed. Max 3 Times  Allergies (verified): No Known Drug Allergies  Past History:  Past Medical History: CAROTID ARTERY DISEASE-- moderate asymptomatic CAD, dx 07-2010----cath, stent  Osteopenia COPD Hyperlipidemia Thyroid nodule TOBACCO ABUSE abnormal CXR 7-09 f/u by a normal CT  chest LIVER FUNCTION TESTS, ABNORMAL  Depression Hypertension, intolerant to ACEi (cough) Allergic rhinitis  Past Surgical History: Reviewed history from 09/12/2009 and no changes required. h/o "floating kidney " 1960 Hysterectomy (08/11/1973), NO oophorectomy  Family History: CAD--Father, B MI at age 4 HTN--Mother sister who has a permanent pacemaker at age 67.  There is no stroke --no colon ca--no breast cancer --no     Social History: retired from office work since 2003.  Widow 1 child , son, commited suicide 2010 Gson lives w/ her  tobbacco-- smoking less since heart stent 12-11  alcohol-- socially      Review of Systems Resp:  Denies cough, coughing up blood, and wheezing. GI:  Denies bloody stools, nausea, and vomiting. GU:  Denies dysuria and hematuria. MS:  Denies muscle aches.  Physical Exam  General:  alert, well-developed, and well-nourished.   Neck:  no masses and no thyromegaly.   Lungs:  normal respiratory effort, no intercostal retractions, no accessory  muscle use, and decreased breath sounds Heart:  normal rate, regular rhythm, and no murmur.   Abdomen:  soft, non-tender, no distention, no masses, no guarding, and no rigidity.   Extremities:  no pretibial edema bilaterally  Neurologic:  alert & oriented X3, strength normal in all extremities, and gait normal.   Psych:  Cognition and judgment appear intact. Alert and cooperative with normal attention span and concentration.  not anxious appearing and not depressed appearing.     Impression & Recommendations:  Problem # 1:  HEALTH SCREENING (ICD-V70.0)  Td 08 had a flu shot pneumonia shot 07-18-10 shingles immunization-- info provided   Female exam per Dr. Lily Peer, last visit September 2011 04-2010 negative mammogram  Never had a colonoscopy Colonoscopy Vs.iFOB cards reviewed w/ pt. Provided  iFOB but she will  call if he decides to have a  colonoscopy   counseling about diet, gradual exercise and tobacco abuse  labs--BMP and CBC on July 18, 2010 normal-----see instructions  Orders: Medicare -1st Annual Wellness Visit [phone])  Problem # 2:  OSTEOPENIA (ICD-733.90) last DEXA    04-2010, osteopenia, T score slightly worse went  from -1.4 to -1.6. Vitamin D 03-2010 low on calcium and vitamin D. I talked with Dr. Lily Peer, the patient's gynecologist,  a few weeks ago, he is concerned about the osteopenia and so am I. She already had Actonel for about 5 years (was discontinued recently to give her a holiday) Options now include Prolia  or Forteo after talking with the patient about this options we decided to wait for a little bit, she is recovering from a cardiac catheterization and  a new diagnoses of CAD Her updated medication list for this problem includes:    Calcium Carbonate-vitamin D 600-400 Mg-unit Tabs (Calcium carbonate-vitamin d) .Marland Kitchen... Take 1 tablet by mouth two times a day  Problem # 3:  CAD (ICD-414.00) Status post stent few days ago. per cardiology Her updated  medication list for this problem includes:    Aspirin 325 Mg Tabs (Aspirin) .Marland Kitchen... 1 by mouth once daily    Diovan 80 Mg Tabs (Valsartan) .Marland Kitchen... Take one tablet by mouth daily    Hydrochlorothiazide 25 Mg Tabs (Hydrochlorothiazide) .Marland Kitchen... 1 a day    Plavix 75 Mg Tabs (Clopidogrel bisulfate) .Marland Kitchen... 1 by mouth once daily with food    Nitrostat 0.4 Mg Subl (Nitroglycerin) .Marland Kitchen... Dissolve 1 tablet under the tongue every 5 mins as needed. max 3 times  Problem # 4:  HYPERTENSION (ICD-401.9) at goal  Her updated medication list for this problem includes:    Diovan 80 Mg Tabs (Valsartan) .Marland Kitchen... Take one tablet by mouth daily    Hydrochlorothiazide 25 Mg Tabs (Hydrochlorothiazide) .Marland Kitchen... 1 a day  BP today: 120/70 Prior BP: 135/62 (07/16/2010)  Labs Reviewed: K+: 4.3 (07/16/2010) Creat: : 0.5 (07/16/2010)   Chol: 183 (04/08/2010)   HDL: 73.40 (04/08/2010)   LDL: 95 (04/08/2010)   TG: 74.0 (04/08/2010)  Problem # 5:  HYPERLIPIDEMIA, BORDERLINE (ICD-272.4)  Her updated medication list for this problem includes:    Simvastatin 20 Mg Tabs (Simvastatin) .Marland Kitchen... 1 by mouth qhs  Labs Reviewed: SGOT: 21 (04/08/2010)   SGPT: 21 (04/08/2010)   HDL:73.40 (04/08/2010), 65.70 (07/20/2009)  LDL:95 (04/08/2010), 91 (07/20/2009)  Chol:183 (04/08/2010), 176 (07/20/2009)  Trig:74.0 (04/08/2010), 97.0 (07/20/2009)  Problem # 6:  COPD (ICD-496) has cut down tobacco use significantly last chest x-ray July 03, 2011 with no acute changes Spirometry today moderate obstruction Fortunately she is asymptomatic. On no medicines.  Orders: Spirometry w/Graph (94010)  Complete Medication List: 1)  Simvastatin 20 Mg Tabs (Simvastatin) .Marland Kitchen.. 1 by mouth qhs 2)  Aspirin 325 Mg Tabs (Aspirin) .Marland Kitchen.. 1 by mouth once daily 3)  Diovan 80 Mg Tabs (Valsartan) .... Take one tablet by mouth daily 4)  Hydrochlorothiazide 25 Mg Tabs (Hydrochlorothiazide) .Marland Kitchen.. 1 a day 5)  Calcium Carbonate-vitamin D 600-400 Mg-unit Tabs (Calcium  carbonate-vitamin d) .... Take 1 tablet by mouth two times a day 6)  Aleve 220 Mg Tabs (Naproxen sodium) .... As needed 7)  Simply Sleep 25 Mg Tabs (Diphenhydramine hcl (sleep)) .... As needed 8)  Claritin 10 Mg Caps (Loratadine) .... One a day as needed for allergies 9)  Flonase 50 Mcg/act Susp (Fluticasone propionate) .... 2  sprays in each side of the nose once daily 10)  Plavix 75 Mg Tabs (Clopidogrel bisulfate) .Marland Kitchen.. 1 by mouth once daily with food 11)  Nitrostat 0.4 Mg Subl (Nitroglycerin) .... Dissolve 1 tablet under the tongue every 5 mins as needed. max 3 times  Patient Instructions: 1)  please come fasting 2)  FLP, AST, ALT TSH ---dx   hyperlipidemia 3)  vit D---- dx  osteopenia  4)  Please schedule a follow-up appointment in 6 months .    Orders Added: 1)  Est. Patient Level III [16109] 2)  Medicare -1st Annual Wellness Visit [G0438] 3)  Spirometry w/Graph [94010]   Immunization History:  Pneumovax Immunization History:    Pneumovax:  historical (07/18/2010)   Immunization History:  Pneumovax Immunization History:    Pneumovax:  Historical (07/18/2010)

## 2010-09-12 NOTE — Letter (Signed)
Summary: [facility]: Immunoassay Fecal Occult Blood (iFOB) Order Form  [facility] [place]  [address] [place], [place] [address]   Phone: [phone]  Fax: [phone]      [facility]: Immunoassay Fecal Occult Blood (iFOB) Order Form   August 06, 2010 MRN: [id number]   Patricia [name] 16-Jan-1941   Physicican Name:__[id number]_______________________  Diagnosis Code:_____jose paz, md _____________________      [name] CMA

## 2010-09-16 ENCOUNTER — Telehealth: Payer: Self-pay | Admitting: Internal Medicine

## 2010-09-18 ENCOUNTER — Ambulatory Visit (INDEPENDENT_AMBULATORY_CARE_PROVIDER_SITE_OTHER): Payer: Medicare Other | Admitting: Internal Medicine

## 2010-09-18 ENCOUNTER — Encounter: Payer: Self-pay | Admitting: Internal Medicine

## 2010-09-18 DIAGNOSIS — I2510 Atherosclerotic heart disease of native coronary artery without angina pectoris: Secondary | ICD-10-CM

## 2010-09-18 DIAGNOSIS — M79609 Pain in unspecified limb: Secondary | ICD-10-CM | POA: Insufficient documentation

## 2010-09-26 NOTE — Assessment & Plan Note (Signed)
Summary: Discuss foot pain and podiatry referral/kb   Vital Signs:  Patient profile:   70 year old female Weight:      123.38 pounds Pulse rate:   65 / minute Pulse rhythm:   regular BP sitting:   128 / 84  (left arm) Cuff size:   regular  Vitals Entered By: [name] CMA (September 18, 2010 2:06 PM) CC: Pt here to discuss (L) foot pain Comments x 4-5 months  Pain comes and goes Worse at night and morning  [facility] [address]    History of Present Illness: continue with on and off intense left foot pain. last exacerbation started one week ago, pain is located at the mid foot, medial aspect, no radiation, worse by walking.  Review of systems  no fevers No recent injury Chart reviewed, see assessment and plan  Current Medications (verified): 1)  Simvastatin 20 Mg  Tabs (Simvastatin) .Marland Kitchen.. 1 By Mouth Qhs 2)  Aspirin 325 Mg Tabs (Aspirin) .Marland Kitchen.. 1 By Mouth Once Daily 3)  Diovan 80 Mg Tabs (Valsartan) .... Take One Tablet By Mouth Daily 4)  Hydrochlorothiazide 25 Mg Tabs (Hydrochlorothiazide) .Marland Kitchen.. 1 A Day 5)  Calcium Carbonate-Vitamin D 600-400 Mg-Unit  Tabs (Calcium Carbonate-Vitamin D) .... Take 1 Tablet By Mouth Two Times A Day 6)  Simply Sleep 25 Mg Tabs (Diphenhydramine Hcl (Sleep)) .... As Needed 7)  Claritin 10 Mg Caps (Loratadine) .... One A Day As Needed For Allergies 8)  Flonase 50 Mcg/act Susp (Fluticasone Propionate) .... 2  Sprays in Each Side of The Nose Once Daily 9)  Plavix 75 Mg Tabs (Clopidogrel Bisulfate) .Marland Kitchen.. 1 By Mouth Once Daily With Food 10)  Nitrostat 0.4 Mg Subl (Nitroglycerin) .... Dissolve 1 Tablet Under The Tongue Every 5 Mins As Needed. Max 3 Times 11)  Vit D  Allergies (verified): No Known Drug Allergies  Past History:  Past Medical History: Reviewed history from 08/02/2010 and no changes required. CAROTID ARTERY DISEASE-- moderate asymptomatic CAD, dx 07-2010----cath, stent  Osteopenia COPD Hyperlipidemia Thyroid nodule TOBACCO  ABUSE abnormal CXR 7-09 f/u by a normal CT chest LIVER FUNCTION TESTS, ABNORMAL  Depression Hypertension, intolerant to ACEi (cough) Allergic rhinitis  Past Surgical History: Reviewed history from 09/12/2009 and no changes required. h/o "floating kidney " 1960 Hysterectomy (08/11/1973), NO oophorectomy  Social History: Reviewed history from 08/02/2010 and no changes required. retired from office work since 2003.  Widow 1 child , son, commited suicide 2010 Gson lives w/ her  tobbacco-- smoking less since heart stent 12-11  alcohol-- socially      Physical Exam  General:  alert and well-developed.   Pulses:  normal pedal pulses bilaterally Extremities:  no pretibial edema bilaterally  right foot normal Left foot: No deformities, essentially symmetric compared  to the right;  at the medial aspect of the foot there is some tendernes palpation. No swelling, redness, no increase temp.   Impression & Recommendations:  Problem # 1:  FOOT PAIN, LEFT (ICD-729.5)  left foot pain This seems to be a recurrent issue X-rays and uric acid few months ago were normal She saw Dr. Arthur Holms, was diagnosed with cellulitis, prescribe antibiotics and she improved. At this point, there is no evidence of cellulitis. Midfoot arthritis? Plan: Refer back to orthopedic surgery  Orders: Orthopedic Referral (Ortho)  Complete Medication List: 1)  Simvastatin 20 Mg Tabs (Simvastatin) .Marland Kitchen.. 1 by mouth qhs 2)  Aspirin 325 Mg Tabs (Aspirin) .Marland Kitchen.. 1 by mouth once daily 3)  Diovan 80  Mg Tabs (Valsartan) .... Take one tablet by mouth daily 4)  Hydrochlorothiazide 25 Mg Tabs (Hydrochlorothiazide) .Marland Kitchen.. 1 a day 5)  Calcium Carbonate-vitamin D 600-400 Mg-unit Tabs (Calcium carbonate-vitamin d) .... Take 1 tablet by mouth two times a day 6)  Simply Sleep 25 Mg Tabs (Diphenhydramine hcl (sleep)) .... As needed 7)  Claritin 10 Mg Caps (Loratadine) .... One a day as needed for allergies 8)  Flonase 50 Mcg/act  Susp (Fluticasone propionate) .... 2  sprays in each side of the nose once daily 9)  Plavix 75 Mg Tabs (Clopidogrel bisulfate) .Marland Kitchen.. 1 by mouth once daily with food 10)  Nitrostat 0.4 Mg Subl (Nitroglycerin) .... Dissolve 1 tablet under the tongue every 5 mins as needed. max 3 times 11)  Vit D  12)  Hydrocodone-acetaminophen 5-500 Mg Tabs (Hydrocodone-acetaminophen) .Marland Kitchen.. 1 or 2  two times a day as needed pain Prescriptions: HYDROCODONE-ACETAMINOPHEN 5-500 MG TABS (HYDROCODONE-ACETAMINOPHEN) 1 or 2  two times a day as needed pain  #40 x 0   Entered and Authorized by:   Nolon Rod. Darien Kading MD   Signed by:   Nolon Rod. Deerica Waszak MD on 09/18/2010   Method used:   Print then Give to Patient   RxID:   [id number]    Orders Added: 1)  Orthopedic Referral [Ortho] 2)  Est. Patient Level III [29528]

## 2010-09-26 NOTE — Progress Notes (Signed)
Summary: Referral request--Podiatry  Phone Note Call from Patient Call back at Home Phone [phone]   Summary of Call: Patient called noting that she is having problems with her left foot and would like referral to podiatrist. She c/o a "gripping vice" that makes it hard for her to walk. Please advise.  **Patient is aware to await a call from [facility] Initial call taken by: Lucious Groves CMA,  September 16, 2010 9:13 AM  Follow-up for Phone Call        I prefer to see her before she goes to podiatry. Please arrange Follow-up by: Cecilio Ohlrich E. Cline Draheim MD,  September 16, 2010 5:18 PM  Additional Follow-up for Phone Call Additional follow up Details #1::        Patient notified and will come in. Additional Follow-up by: Lucious Groves CMA,  September 17, 2010 9:14 AM

## 2010-10-01 ENCOUNTER — Encounter: Payer: Self-pay | Admitting: Internal Medicine

## 2010-10-04 ENCOUNTER — Encounter: Payer: Self-pay | Admitting: Internal Medicine

## 2010-10-09 ENCOUNTER — Telehealth: Payer: Self-pay | Admitting: Internal Medicine

## 2010-10-17 NOTE — Progress Notes (Signed)
Summary: Hydrocodone refill  Phone Note Refill Request Message from:  Fax from Pharmacy on October 09, 2010 10:52 AM  Refills Requested: Medication #1:  HYDROCODONE-ACETAMINOPHEN 5-500 MG TABS 1 or 2  two times a day as needed pain.   Last Ref[facility]: [id number] [address] [place], [place], [place]  [address]       phone- [phone],   fax - [phone]    qty - 40  Next Appointment Scheduled: none Initial call taken by: Jerolyn Shin,  October 09, 2010 10:53 AM  Follow-up for Phone Call        40, 1 RF Tahjai Schetter E. Ksenia Kunz MD  October 09, 2010 2:32 PM     Prescriptions: HYDROCODONE-ACETAMINOPHEN 5-500 MG TABS (HYDROCODONE-ACETAMINOPHEN) 1 or 2  two times a day as needed pain  #40 x 1   Entered by:   [name] CMA   Authorized by:   Nolon Rod. Coletta Lockner MD   Signed by:   [name] CMA on 10/09/2010   Method used:   Printed then faxed to ...       [facility] [address] [facility] [address] ([facility]ail)       [address] [place], [place]  [address]       Ph: [phone]       Fax: [phone]   RxID:   [id number]

## 2010-10-18 ENCOUNTER — Telehealth: Payer: Self-pay | Admitting: Internal Medicine

## 2010-10-22 LAB — BASIC METABOLIC PANEL
CO2: 30 mEq/L (ref 19–32)
GFR calc Af Amer: >60 mL/min (ref >60)
GFR calc non Af Amer: >60 mL/min (ref >60)
Glucose, Bld: 83 mg/dL (ref 70–99)
Potassium: 4.2 mEq/L (ref 3.5–5.1)
Sodium: 140 mEq/L (ref 135–145)

## 2010-10-22 LAB — CBC
HCT: 40.5 % (ref 36.0–46.0)
Hemoglobin: 13.6 g/dL (ref 12.0–15.0)
MCHC: 33.6 g/dL (ref 30.0–36.0)
RBC: 4.43 MIL/uL (ref 3.87–5.11)

## 2010-10-22 NOTE — Medication Information (Signed)
Summary: Drug Utilization Review-Fluoxetine & Plavix  Drug Utilization Review-Fluoxetine & Plavix   Imported By: Maryln Gottron 10/16/2010 10:23:21  _____________________________________________________________________  External Attachment:    Type:   Image     Comment:   External Document

## 2010-10-22 NOTE — Progress Notes (Signed)
Summary: referral to podiatrist   Phone Note Call from Patient Call back at Home Phone [phone]   Caller: Patient Summary of Call: Spoke w/ patient says she isn't getting any where with ortho and would like to be referral to podiatrist. Initial call taken by: Doristine Devoid CMA,  October 18, 2010 11:11 AM  Follow-up for Phone Call        ok Patricia [name] E. Margaretmary Prisk MD  October 18, 2010 4:33 PM   patient aware.Marland KitchenMarland KitchenDoristine Devoid CMA  October 18, 2010 4:35 PM

## 2010-10-22 NOTE — Letter (Signed)
Summary: ?gout-----[facility]  [facility]   Imported By: Maryln Gottron 10/10/2010 14:47:55  _____________________________________________________________________  External Attachment:    Type:   Image     Comment:   External Document

## 2010-10-31 LAB — RAPID STREP SCREEN (MED CTR MEBANE ONLY): Streptococcus, Group A Screen (Direct): NEGATIVE

## 2010-11-12 ENCOUNTER — Other Ambulatory Visit: Payer: Self-pay | Admitting: Cardiovascular Disease

## 2010-11-12 DIAGNOSIS — I6529 Occlusion and stenosis of unspecified carotid artery: Secondary | ICD-10-CM

## 2010-11-13 ENCOUNTER — Other Ambulatory Visit: Payer: Self-pay | Admitting: *Deleted

## 2010-11-13 ENCOUNTER — Encounter (INDEPENDENT_AMBULATORY_CARE_PROVIDER_SITE_OTHER): Payer: Medicare Other | Admitting: *Deleted

## 2010-11-13 DIAGNOSIS — I6529 Occlusion and stenosis of unspecified carotid artery: Secondary | ICD-10-CM

## 2010-11-14 ENCOUNTER — Other Ambulatory Visit: Payer: Self-pay | Admitting: Internal Medicine

## 2010-11-18 ENCOUNTER — Encounter: Payer: Self-pay | Admitting: Cardiovascular Disease

## 2010-12-01 ENCOUNTER — Telehealth: Payer: Self-pay | Admitting: Internal Medicine

## 2010-12-01 DIAGNOSIS — E041 Nontoxic single thyroid nodule: Secondary | ICD-10-CM

## 2010-12-01 NOTE — Telephone Encounter (Signed)
Advise patient: Due for thyroid u/s, please schedule Needs OV ~ 01-2011 [facility]

## 2010-12-02 NOTE — Telephone Encounter (Signed)
I spoke w/ pt she is aware.  

## 2010-12-02 NOTE — Telephone Encounter (Signed)
Message left for patient to return my call.  

## 2010-12-04 ENCOUNTER — Ambulatory Visit
Admission: RE | Admit: 2010-12-04 | Discharge: 2010-12-04 | Disposition: A | Discharge status: Home / Self Care | Payer: Medicare Other | Source: Ambulatory Visit | Attending: Internal Medicine | Admitting: Internal Medicine

## 2010-12-04 DIAGNOSIS — E041 Nontoxic single thyroid nodule: Secondary | ICD-10-CM

## 2010-12-24 NOTE — Letter (Signed)
August 25, 2008    Patricia Ora, MD  [phone] [place], [place] [address]   RE:  Patricia, [name]  MRN:  [id number]  /  DOB:  May 07, 1941   Dear Patricia [name]:   It was my pleasure to see Patricia [name] on August 15, 2008, for  evaluation of carotid stenosis.   The patient is a 70 year old woman with no history of symptomatic  cardiovascular disease, who has undergone serial followup of carotid  stenosis with vascular ultrasound studies.  Her most recent study was  performed on September 18.  This demonstrated moderate bilateral carotid  atherosclerosis with calcification and internal carotid artery  velocities correlating with 50-69% bilateral stenoses.   From symptomatic standpoint, the patient feels well.  She has no history  of stroke, TIA, or amaurosis fugax.  She has undergone serial carotid  duplex scan since 2006 and has had slowly progressive disease.  She  denies chest pain, dyspnea, orthopnea, PND, or edema.  She does report  an episode of blurry vision that occurred approximately 5 years ago and  was self-limited.  There was no visual field loss or other symptoms  associated.   PAST MEDICAL HISTORY:  1. Dyslipidemia.  2. Osteopenia.  3. COPD.  4. History of thyroid nodule.  5. Tobacco abuse.  6. History of abnormal liver function tests.  7. Hysterectomy in the 1970s.  8. Osteoarthritis.   CURRENT MEDICATIONS:  1. Aspirin 81 mg daily.  2. Simvastatin 20 mg at bedtime.  3. Actonel 150 mg monthly.  4. Calcium plus D 1200 mg daily.   SOCIAL HISTORY:  The patient is retired from office work since 2003.  She has been widowed for 3 years.  She is a smoker for approximately 50  years, currently a one-half-pack daily.  She drinks alcohol socially.   FAMILY HISTORY:  The patient's brother died of an MI at age 27.  She has  a sister who has a permanent pacemaker at age 19.  There is no history  of stroke in the family.   REVIEW OF SYSTEMS:  Complete 12-point review of  systems was performed is  negative except as outlined above.   PHYSICAL EXAMINATION:  GENERAL:  The patient is alert and oriented.  She  is in no acute distress.  VITAL SIGNS:  Weight is 127 pounds, blood pressure is 152/96, heart rate  is 68, and respiratory rate is 12.  HEENT:  Normal.  NECK:  Normal carotid upstrokes, has a right carotid bruit present.  No  thyromegaly or thyroid nodules are appreciated.  LUNGS:  Clear bilaterally.  HEART:  Regular rate and rhythm without murmurs or gallops.  ABDOMEN:  Soft and nontender.  No abdominal bruits.  No organomegaly.  BACK:  No CVA tenderness.  EXTREMITIES:  Peripheral pulses are 2+ and equal throughout.  Her  bilateral femoral bruits present.  SKIN:  Warm and dry without rash.  NEUROLOGIC:  Cranial nerves II through XII are intact.  Strength is  intact and equal.  LYMPHATICS:  No adenopathy.   ASSESSMENT:  This is a 70 year old woman with moderate bilateral carotid  stenosis.  The patient is asymptomatic.  She will require long-term  followup and aggressive risk reduction measures to prevent progressive  carotid stenosis and need for carotid endarterectomy.  She is currently  well treated with simvastatin to lower her cholesterol and she takes  some daily aspirin.  I counseled her at length regarding the importance  of tobacco cessation.  Her blood pressure is elevated today and she  reports that it has been elevated at times in the past.  I have taken  the liberty of starting her on lisinopril 10 mg daily.  She should have  a followup metabolic panel in a few weeks.  She has had normal  creatinine in the past.   RECOMMENDATIONS:  Recommend followup carotid duplex in 6 months, which  will be arranged.  Depending on the severity of stenosis, she will need  6 months-to-annual followup  thereafter.  If her carotid stenosis approaches the 80% range by Doppler  criteria, we will need to consider carotid surgery for stroke   prevention.   Jose, thanks again for asking me to see this very nice lady.  Please  feel free to call at any time with questions.    Sincerely,      Veverly Fells. Excell Seltzer, MD  Electronically Signed    MDC/MedQ  DD: 08/25/2008  DT: 08/25/2008  Job #: [id number]

## 2010-12-27 ENCOUNTER — Other Ambulatory Visit: Payer: Self-pay | Admitting: Cardiovascular Disease

## 2011-01-09 ENCOUNTER — Other Ambulatory Visit: Payer: Self-pay | Admitting: Cardiovascular Disease

## 2011-02-13 ENCOUNTER — Other Ambulatory Visit: Payer: Self-pay | Admitting: *Deleted

## 2011-02-13 MED ORDER — CLOPIDOGREL BISULFATE 75 MG PO TABS: 75.0000 mg | ORAL_TABLET | Freq: Every day | ORAL | Status: DC

## 2011-02-25 ENCOUNTER — Other Ambulatory Visit: Payer: Self-pay | Admitting: Internal Medicine

## 2011-03-10 ENCOUNTER — Encounter: Payer: Self-pay | Admitting: Cardiovascular Disease

## 2011-04-08 ENCOUNTER — Encounter: Payer: Self-pay | Admitting: Cardiovascular Disease

## 2011-04-08 ENCOUNTER — Ambulatory Visit (INDEPENDENT_AMBULATORY_CARE_PROVIDER_SITE_OTHER): Payer: Medicare Other | Admitting: Cardiovascular Disease

## 2011-04-08 VITALS — BP 124/72 | HR 64 | Ht 62.0 in | Wt 122.0 lb

## 2011-04-08 DIAGNOSIS — I2510 Atherosclerotic heart disease of native coronary artery without angina pectoris: Secondary | ICD-10-CM

## 2011-04-08 DIAGNOSIS — I6529 Occlusion and stenosis of unspecified carotid artery: Secondary | ICD-10-CM

## 2011-04-08 NOTE — Progress Notes (Signed)
HPI:  This is a 70 year old woman presenting for followup evaluation. The patient has coronary artery disease and underwent percutaneous intervention in December 2011 when she was treated with a drug-eluting stent in the left circumflex. She was also noted to have severe stenosis of the right coronary artery but the distal vessel was supplied by collaterals and she was treated medically. The patient has had a marked improvement in her symptoms ever since her PCI procedure. She does complain of continued fatigue. She takes nitroglycerin about 2 times per week for anginal symptoms. She does not have typical chest pain or tightness with exertion but does have occasional left-sided chest pain. Her symptoms usually occur in the morning hours and there is no change in the pattern over the last year.  As noted the patient has moderate bilateral carotid artery stenosis. She has no history of stroke or TIA she has no neurologic symptoms at the present time.    No Known Allergies  Past Medical History  Diagnosis Date  . Carotid artery disease     moderate; asymptomatic  . CAD (coronary artery disease)     cath with stent placed  . Osteopenia   . COPD (chronic obstructive pulmonary disease)   . Hyperlipidemia   . Thyroid nodule   . Abnormal LFTs   . Depression   . HTN (hypertension)     intolerant to ACEi (cough)  . Allergic rhinitis     ROS: Negative except as per HPI  BP 124/72  Pulse 64  Ht 5\' 2"  (1.575 m)  Wt 122 lb (55.339 kg)  BMI 22.31 kg/m2  PHYSICAL EXAM: Pt is alert and oriented, NAD HEENT: normal Neck: JVP - normal, carotids 2+= with a right carotid bruit Lungs: CTA bilaterally CV: RRR without murmur or gallop Abd: soft, NT, Positive BS, no hepatomegaly Ext: no C/C/E, distal pulses intact and equal Skin: warm/dry no rash  EKG:  Normal sinus rhythm 64 beats per minute, within normal limits.  ASSESSMENT AND PLAN:

## 2011-04-08 NOTE — Patient Instructions (Signed)
Your physician wants you to follow-up in: January 2013. You will receive a reminder letter in the mail two months in advance. If you don't receive a letter, please call our office to schedule the follow-up appointment.  Your physician has requested that you have a carotid duplex. This test is an ultrasound of the carotid arteries in your neck. It looks at blood flow through these arteries that supply the brain with blood. Allow one hour for this exam. There are no restrictions or special instructions. To be done in April 2013.

## 2011-04-17 ENCOUNTER — Telehealth: Payer: Self-pay | Admitting: Cardiovascular Disease

## 2011-04-17 DIAGNOSIS — I2510 Atherosclerotic heart disease of native coronary artery without angina pectoris: Secondary | ICD-10-CM

## 2011-04-17 MED ORDER — CLOPIDOGREL BISULFATE 75 MG PO TABS: 75.0000 mg | ORAL_TABLET | Freq: Every day | ORAL | Status: DC

## 2011-04-17 NOTE — Telephone Encounter (Signed)
Pt needs new pres for Plavix for 90 day supply instead of 30 day supply.  Call into [facility] on [address] [phone]

## 2011-04-17 NOTE — Telephone Encounter (Signed)
Prescription sent

## 2011-05-01 NOTE — Assessment & Plan Note (Signed)
The patient has moderate carotid stenosis, asymptomatic. Continue carotid duplex surveillance, and extended to be done in April 2013.

## 2011-05-01 NOTE — Assessment & Plan Note (Signed)
The patient has stable angina, class II symptoms with no change in overall pattern. She should remain on her current medical program and I would like to see her back in approximately 6 months for followup. She is on dual antiplatelet therapy with aspirin and Plavix, lipid-lowering with simvastatin, and her hypertension is treated with Diovan.

## 2011-05-18 ENCOUNTER — Other Ambulatory Visit: Payer: Self-pay | Admitting: Cardiovascular Disease

## 2011-06-10 ENCOUNTER — Other Ambulatory Visit: Payer: Self-pay | Admitting: Internal Medicine

## 2011-07-02 ENCOUNTER — Encounter (HOSPITAL_COMMUNITY)
Admission: EM | Disposition: A | Discharge status: Home / Self Care | Payer: Self-pay | Source: Home / Self Care | Attending: Cardiovascular Disease

## 2011-07-02 ENCOUNTER — Encounter (HOSPITAL_COMMUNITY): Payer: Self-pay | Admitting: *Deleted

## 2011-07-02 ENCOUNTER — Other Ambulatory Visit: Payer: Self-pay

## 2011-07-02 ENCOUNTER — Emergency Department (HOSPITAL_COMMUNITY): Payer: Medicare Other

## 2011-07-02 ENCOUNTER — Inpatient Hospital Stay (HOSPITAL_COMMUNITY): Payer: Medicare Other

## 2011-07-02 ENCOUNTER — Inpatient Hospital Stay (HOSPITAL_COMMUNITY)
Admission: EM | Admit: 2011-07-02 | Discharge: 2011-07-09 | DRG: 250 | Disposition: A | Discharge status: Home / Self Care | Payer: Medicare Other | Attending: Cardiovascular Disease | Admitting: Cardiovascular Disease

## 2011-07-02 DIAGNOSIS — D72829 Elevated white blood cell count, unspecified: Secondary | ICD-10-CM

## 2011-07-02 DIAGNOSIS — R0902 Hypoxemia: Secondary | ICD-10-CM | POA: Diagnosis present

## 2011-07-02 DIAGNOSIS — Y849 Medical procedure, unspecified as the cause of abnormal reaction of the patient, or of later complication, without mention of misadventure at the time of the procedure: Secondary | ICD-10-CM | POA: Diagnosis present

## 2011-07-02 DIAGNOSIS — R0609 Other forms of dyspnea: Secondary | ICD-10-CM

## 2011-07-02 DIAGNOSIS — E871 Hypo-osmolality and hyponatremia: Secondary | ICD-10-CM | POA: Diagnosis present

## 2011-07-02 DIAGNOSIS — L989 Disorder of the skin and subcutaneous tissue, unspecified: Secondary | ICD-10-CM

## 2011-07-02 DIAGNOSIS — I249 Acute ischemic heart disease, unspecified: Secondary | ICD-10-CM

## 2011-07-02 DIAGNOSIS — Z79899 Other long term (current) drug therapy: Secondary | ICD-10-CM

## 2011-07-02 DIAGNOSIS — E8779 Other fluid overload: Secondary | ICD-10-CM | POA: Diagnosis not present

## 2011-07-02 DIAGNOSIS — F3289 Other specified depressive episodes: Secondary | ICD-10-CM | POA: Diagnosis present

## 2011-07-02 DIAGNOSIS — F17200 Nicotine dependence, unspecified, uncomplicated: Secondary | ICD-10-CM | POA: Diagnosis present

## 2011-07-02 DIAGNOSIS — I2510 Atherosclerotic heart disease of native coronary artery without angina pectoris: Secondary | ICD-10-CM

## 2011-07-02 DIAGNOSIS — J9819 Other pulmonary collapse: Secondary | ICD-10-CM | POA: Diagnosis present

## 2011-07-02 DIAGNOSIS — I6529 Occlusion and stenosis of unspecified carotid artery: Secondary | ICD-10-CM | POA: Diagnosis present

## 2011-07-02 DIAGNOSIS — T82897A Other specified complication of cardiac prosthetic devices, implants and grafts, initial encounter: Secondary | ICD-10-CM | POA: Diagnosis present

## 2011-07-02 DIAGNOSIS — I517 Cardiomegaly: Secondary | ICD-10-CM | POA: Diagnosis present

## 2011-07-02 DIAGNOSIS — E876 Hypokalemia: Secondary | ICD-10-CM | POA: Diagnosis not present

## 2011-07-02 DIAGNOSIS — I10 Essential (primary) hypertension: Secondary | ICD-10-CM | POA: Diagnosis present

## 2011-07-02 DIAGNOSIS — F410 Panic disorder [episodic paroxysmal anxiety] without agoraphobia: Secondary | ICD-10-CM | POA: Diagnosis present

## 2011-07-02 DIAGNOSIS — M949 Disorder of cartilage, unspecified: Secondary | ICD-10-CM

## 2011-07-02 DIAGNOSIS — T380X5A Adverse effect of glucocorticoids and synthetic analogues, initial encounter: Secondary | ICD-10-CM | POA: Diagnosis present

## 2011-07-02 DIAGNOSIS — J309 Allergic rhinitis, unspecified: Secondary | ICD-10-CM | POA: Diagnosis present

## 2011-07-02 DIAGNOSIS — Z7982 Long term (current) use of aspirin: Secondary | ICD-10-CM

## 2011-07-02 DIAGNOSIS — R7989 Other specified abnormal findings of blood chemistry: Secondary | ICD-10-CM | POA: Diagnosis present

## 2011-07-02 DIAGNOSIS — F329 Major depressive disorder, single episode, unspecified: Secondary | ICD-10-CM | POA: Diagnosis present

## 2011-07-02 DIAGNOSIS — E785 Hyperlipidemia, unspecified: Secondary | ICD-10-CM | POA: Diagnosis present

## 2011-07-02 DIAGNOSIS — J441 Chronic obstructive pulmonary disease with (acute) exacerbation: Secondary | ICD-10-CM | POA: Diagnosis present

## 2011-07-02 DIAGNOSIS — M899 Disorder of bone, unspecified: Secondary | ICD-10-CM | POA: Diagnosis present

## 2011-07-02 DIAGNOSIS — Z7902 Long term (current) use of antithrombotics/antiplatelets: Secondary | ICD-10-CM

## 2011-07-02 DIAGNOSIS — J9600 Acute respiratory failure, unspecified whether with hypoxia or hypercapnia: Secondary | ICD-10-CM | POA: Diagnosis not present

## 2011-07-02 DIAGNOSIS — R079 Chest pain, unspecified: Secondary | ICD-10-CM

## 2011-07-02 DIAGNOSIS — J449 Chronic obstructive pulmonary disease, unspecified: Secondary | ICD-10-CM

## 2011-07-02 DIAGNOSIS — R945 Abnormal results of liver function studies: Secondary | ICD-10-CM

## 2011-07-02 DIAGNOSIS — I200 Unstable angina: Secondary | ICD-10-CM | POA: Diagnosis present

## 2011-07-02 DIAGNOSIS — M79609 Pain in unspecified limb: Secondary | ICD-10-CM

## 2011-07-02 DIAGNOSIS — I252 Old myocardial infarction: Secondary | ICD-10-CM

## 2011-07-02 DIAGNOSIS — E041 Nontoxic single thyroid nodule: Secondary | ICD-10-CM

## 2011-07-02 DIAGNOSIS — F411 Generalized anxiety disorder: Secondary | ICD-10-CM | POA: Diagnosis present

## 2011-07-02 DIAGNOSIS — Z9981 Dependence on supplemental oxygen: Secondary | ICD-10-CM

## 2011-07-02 HISTORY — PX: LEFT HEART CATHETERIZATION WITH CORONARY ANGIOGRAM: SHX5451

## 2011-07-02 HISTORY — DX: Panic disorder (episodic paroxysmal anxiety): F41.0

## 2011-07-02 HISTORY — PX: PERCUTANEOUS CORONARY INTERVENTION-BALLOON ONLY: SHX6014

## 2011-07-02 LAB — CARDIAC PANEL(CRET KIN+CKTOT+MB+TROPI)
CK, MB: 3.5 ng/mL (ref 0.3–4.0)
Relative Index: INVALID (ref 0.0–2.5)
Total CK: 65 U/L (ref 7–177)
Troponin I: <0.3 ng/mL (ref <0.30)

## 2011-07-02 LAB — BASIC METABOLIC PANEL
BUN: 13 mg/dL (ref 6–23)
CO2: 28 mEq/L (ref 19–32)
Calcium: 9 mg/dL (ref 8.4–10.5)
Chloride: 93 mEq/L — ABNORMAL LOW (ref 96–112)
Creatinine, Ser: 0.6 mg/dL (ref 0.50–1.10)
Glucose, Bld: 190 mg/dL — ABNORMAL HIGH (ref 70–99)

## 2011-07-02 LAB — POCT ACTIVATED CLOTTING TIME
Activated Clotting Time: 232 seconds
Activated Clotting Time: 534 seconds

## 2011-07-02 LAB — CBC
HCT: 39.3 % (ref 36.0–46.0)
Platelets: 216 10*3/uL (ref 150–400)
RDW: 12.5 % (ref 11.5–15.5)
WBC: 11.4 10*3/uL — ABNORMAL HIGH (ref 4.0–10.5)

## 2011-07-02 LAB — DIFFERENTIAL
Basophils Absolute: 0 10*3/uL (ref 0.0–0.1)
Basophils Relative: 0 % (ref 0–1)
Lymphocytes Relative: 4 % — ABNORMAL LOW (ref 12–46)
Monocytes Absolute: 0 10*3/uL — ABNORMAL LOW (ref 0.1–1.0)
Neutro Abs: 10.9 10*3/uL — ABNORMAL HIGH (ref 1.7–7.7)
Neutrophils Relative %: 96 % — ABNORMAL HIGH (ref 43–77)

## 2011-07-02 LAB — POCT I-STAT, CHEM 8
BUN: 9 mg/dL (ref 6–23)
Calcium, Ion: 1.13 mmol/L (ref 1.12–1.32)
Chloride: 92 mEq/L — ABNORMAL LOW (ref 96–112)
Creatinine, Ser: 0.8 mg/dL (ref 0.50–1.10)

## 2011-07-02 LAB — MRSA PCR SCREENING: MRSA by PCR: NEGATIVE

## 2011-07-02 SURGERY — LEFT HEART CATHETERIZATION WITH CORONARY ANGIOGRAM
Anesthesia: LOCAL

## 2011-07-02 MED ORDER — HYDROCHLOROTHIAZIDE 25 MG PO TABS
25.0000 mg | ORAL_TABLET | Freq: Every day | ORAL | Status: DC
  Administered 2011-07-03 – 2011-07-06 (×4): 25 mg via ORAL
  Filled 2011-07-02 (×5): qty 1

## 2011-07-02 MED ORDER — DIAZEPAM 5 MG PO TABS
5.0000 mg | ORAL_TABLET | ORAL | Status: DC | PRN
  Administered 2011-07-02 – 2011-07-06 (×9): 5 mg via ORAL
  Filled 2011-07-02 (×9): qty 1

## 2011-07-02 MED ORDER — ACETAMINOPHEN 325 MG PO TABS: 650.0000 mg | ORAL_TABLET | ORAL | Status: DC | PRN

## 2011-07-02 MED ORDER — OLMESARTAN 10 MG HALF TABLET
10.0000 mg | ORAL_TABLET | Freq: Every day | ORAL | Status: DC
  Administered 2011-07-03: 10 mg via ORAL
  Administered 2011-07-04: 10:00:00 via ORAL
  Administered 2011-07-05 – 2011-07-09 (×5): 10 mg via ORAL
  Filled 2011-07-02 (×7): qty 1

## 2011-07-02 MED ORDER — MORPHINE SULFATE 4 MG/ML IJ SOLN
4.0000 mg | INTRAMUSCULAR | Status: DC | PRN
  Administered 2011-07-05: 4 mg via INTRAVENOUS
  Filled 2011-07-02: qty 1

## 2011-07-02 MED ORDER — SODIUM CHLORIDE 0.9 % IV SOLN
INTRAVENOUS | Status: DC
  Administered 2011-07-02: 08:00:00 via INTRAVENOUS

## 2011-07-02 MED ORDER — ALUM & MAG HYDROXIDE-SIMETH 200-200-20 MG/5ML PO SUSP
ORAL | Status: AC
  Filled 2011-07-02: qty 30

## 2011-07-02 MED ORDER — HEPARIN (PORCINE) IN NACL 2-0.9 UNIT/ML-% IJ SOLN
INTRAMUSCULAR | Status: AC
  Filled 2011-07-02: qty 2000

## 2011-07-02 MED ORDER — MIDAZOLAM HCL 2 MG/2ML IJ SOLN
INTRAMUSCULAR | Status: AC
  Filled 2011-07-02: qty 2

## 2011-07-02 MED ORDER — CLOPIDOGREL BISULFATE 75 MG PO TABS
75.0000 mg | ORAL_TABLET | Freq: Every day | ORAL | Status: DC
  Administered 2011-07-03 – 2011-07-09 (×7): 75 mg via ORAL
  Filled 2011-07-02 (×9): qty 1

## 2011-07-02 MED ORDER — HEPARIN BOLUS VIA INFUSION
4000.0000 [IU] | Freq: Once | INTRAVENOUS | Status: DC
Start: 2011-07-02 — End: 2011-07-02
  Filled 2011-07-02: qty 4000

## 2011-07-02 MED ORDER — NITROGLYCERIN 0.2 MG/ML ON CALL CATH LAB
INTRAVENOUS | Status: AC
  Filled 2011-07-02: qty 1

## 2011-07-02 MED ORDER — NITROGLYCERIN IN D5W 200-5 MCG/ML-% IV SOLN
INTRAVENOUS | Status: AC
  Filled 2011-07-02: qty 250

## 2011-07-02 MED ORDER — LORATADINE 10 MG PO TABS
10.0000 mg | ORAL_TABLET | Freq: Every day | ORAL | Status: DC | PRN
  Administered 2011-07-06: 10 mg via ORAL
  Filled 2011-07-02 (×2): qty 1

## 2011-07-02 MED ORDER — DIPHENHYDRAMINE HCL (SLEEP) 25 MG PO TABS
25.0000 mg | ORAL_TABLET | Freq: Every evening | ORAL | Status: DC | PRN
  Filled 2011-07-02: qty 1

## 2011-07-02 MED ORDER — NITROGLYCERIN IN D5W 200-5 MCG/ML-% IV SOLN
5.0000 ug/min | INTRAVENOUS | Status: DC
Start: 2011-07-02 — End: 2011-07-02
  Administered 2011-07-02: 5 ug/min via INTRAVENOUS

## 2011-07-02 MED ORDER — LIDOCAINE HCL (PF) 1 % IJ SOLN
INTRAMUSCULAR | Status: AC
  Filled 2011-07-02: qty 30

## 2011-07-02 MED ORDER — CLOPIDOGREL BISULFATE 300 MG PO TABS
ORAL_TABLET | ORAL | Status: AC
  Filled 2011-07-02: qty 1

## 2011-07-02 MED ORDER — SIMVASTATIN 20 MG PO TABS
20.0000 mg | ORAL_TABLET | Freq: Every day | ORAL | Status: DC
  Administered 2011-07-02 – 2011-07-08 (×7): 20 mg via ORAL
  Filled 2011-07-02 (×8): qty 1

## 2011-07-02 MED ORDER — ASPIRIN 81 MG PO CHEW
81.0000 mg | CHEWABLE_TABLET | Freq: Every day | ORAL | Status: DC
  Administered 2011-07-03 – 2011-07-09 (×7): 81 mg via ORAL
  Filled 2011-07-02 (×7): qty 1

## 2011-07-02 MED ORDER — FENTANYL CITRATE 0.05 MG/ML IJ SOLN
INTRAMUSCULAR | Status: AC
  Filled 2011-07-02: qty 2

## 2011-07-02 MED ORDER — ALUM & MAG HYDROXIDE-SIMETH 400-400-40 MG/5ML PO SUSP
30.0000 mL | ORAL | Status: DC | PRN
Start: 2011-07-02 — End: 2011-07-02
  Administered 2011-07-02: 30 mL via ORAL
  Filled 2011-07-02: qty 30

## 2011-07-02 MED ORDER — HEPARIN SODIUM (PORCINE) 5000 UNIT/ML IJ SOLN
INTRAMUSCULAR | Status: AC
  Administered 2011-07-02: 4000 [IU] via INTRAVENOUS
  Filled 2011-07-02: qty 1

## 2011-07-02 MED ORDER — LORAZEPAM 2 MG/ML IJ SOLN
1.0000 mg | Freq: Once | INTRAMUSCULAR | Status: AC
  Administered 2011-07-02: 1 mg via INTRAVENOUS
  Filled 2011-07-02: qty 1

## 2011-07-02 MED ORDER — SODIUM CHLORIDE 0.9 % IV SOLN
1.0000 mL/kg/h | INTRAVENOUS | Status: AC
  Administered 2011-07-02: 1 mL/kg/h via INTRAVENOUS

## 2011-07-02 MED ORDER — LEVALBUTEROL HCL 0.63 MG/3ML IN NEBU
0.6300 mg | INHALATION_SOLUTION | Freq: Three times a day (TID) | RESPIRATORY_TRACT | Status: DC
  Administered 2011-07-02 – 2011-07-05 (×11): 0.63 mg via RESPIRATORY_TRACT
  Filled 2011-07-02 (×13): qty 3

## 2011-07-02 MED ORDER — BIVALIRUDIN 250 MG IV SOLR
INTRAVENOUS | Status: AC
  Filled 2011-07-02: qty 250

## 2011-07-02 MED ORDER — ONDANSETRON HCL 4 MG/2ML IJ SOLN: 4.0000 mg | Freq: Four times a day (QID) | INTRAMUSCULAR | Status: DC | PRN

## 2011-07-02 MED ORDER — OXYCODONE-ACETAMINOPHEN 5-325 MG PO TABS: 1.0000 | ORAL_TABLET | ORAL | Status: DC | PRN

## 2011-07-02 NOTE — ED Notes (Signed)
Pt transported to cath lab with rn at 08:23

## 2011-07-02 NOTE — ED Provider Notes (Signed)
History     CSN: [id number] Arrival date & time: 07/02/2011  7:46 AM   First MD Initiated Contact with Patient 07/02/11 [phone]      Chief Complaint  Patient presents with  . Shortness of Breath    (Consider location/radiation/quality/duration/timing/severity/associated sxs/prior treatment) Patient is a 70 y.o. female presenting with chest pain.  Chest Pain The chest pain began more  than 1 month ago. Chest pain occurs frequently. The chest pain is worsening (She has had daily chest pain for one month. She takes nitroglycerin every day.Marland Kitchen). Associated with: No known provocation. At its most intense, the pain is at 8/10. The pain is currently at 8/10. The quality of the pain is described as aching. The pain does not radiate. Pertinent negatives for primary symptoms include no fever, no fatigue, no syncope and no cough.  Pertinent negatives for associated symptoms include no claudication and no near-syncope. She tried nitroglycerin and aspirin for the symptoms. Risk factors include no known risk factors.  Her past medical history is significant for CAD and MI.  Pertinent negatives for past medical history include no aneurysm and no aortic aneurysm.  Procedure history is positive for cardiac catheterization.     Past Medical History  Diagnosis Date  . Carotid artery disease     moderate; asymptomatic  . CAD (coronary artery disease)     cath with stent placed  . Osteopenia   . COPD (chronic obstructive pulmonary disease)   . Hyperlipidemia   . Thyroid nodule   . Abnormal LFTs   . Depression   . HTN (hypertension)     intolerant to ACEi (cough)  . Allergic rhinitis   . Anxiety   . Panic attacks     Past Surgical History  Procedure Date  . H/o "floating kidney" 1960  . Abdominal hysterectomy 08/11/1973    no oophorectomy  . Coronary stent placement     Family History  Problem Relation Age of Onset  . Coronary artery disease Father   . Heart attack Father   . Hypertension  Mother   . Stroke Neg Hx   . Colon cancer Neg Hx   . Breast cancer Neg Hx     History  Substance Use Topics  . Smoking status: Current Some Day Smoker -- 0.5 packs/day for 58 years  . Smokeless tobacco: Not on file   Comment: smoking less since heart stent 07-2010  . Alcohol Use: Yes     socially    OB History    Grav Para Term Preterm Abortions TAB SAB Ect Mult Living                  Review of Systems  Constitutional: Negative for fever and fatigue.  Respiratory: Negative for cough.   Cardiovascular: Positive for chest pain. Negative for claudication, syncope and near-syncope.  All other systems reviewed and are negative.    Allergies  Review of patient's allergies indicates no known allergies.  Home Medications     BP 150/45  Pulse 103  Temp(Src) 98 F (36.7 C) (Oral)  Resp 17  SpO2 95%  Physical Exam  Constitutional: She appears well-developed and well-nourished.  HENT:  Head: Normocephalic.  Eyes: Conjunctivae and EOM are normal. Pupils are equal, round, and reactive to light.  Neck: Normal range of motion. Neck supple.  Cardiovascular: Normal rate and regular rhythm.   Pulmonary/Chest: Effort normal.       Decreased air, bilateral lung fields. No audible wheezing, rales or rhonchi  ED Course  Procedures (including critical care time)  Emergency department treatment: Aspirin deferred as she had it at 6:30 AM at home. IV heparin and IV nitroglycerin ordered. Urgent consultation with cardiology for changed EKG concerning for MI, not distinctly STEMI. Her clinical progression indicates unstable angina. Old Chart reviewed; she had PTCA for right circumflex disease in 07/2010. She has been medically managed since that time.  CRITICAL CARE Performed by: Flint Melter   Total critical care time: 35  Critical care time was exclusive of separately billable procedures and treating other patients.  Critical care was necessary to treat or prevent  imminent or life-threatening deterioration.  Critical care was time spent personally by me on the following activities: development of treatment plan with patient and/or surrogate as well as nursing, discussions with consultants, evaluation of patient's response to treatment, examination of patient, obtaining history from patient or surrogate, ordering and performing treatments and interventions, ordering and review of laboratory studies, ordering and review of radiographic studies, pulse oximetry and re-evaluation of patient's condition.  Date: 07/02/2011  Rate: 94   Rhythm: normal sinus rhythm  QRS Axis: normal  Intervals: normal  ST/T Wave abnormalities: ST depressions laterally  Conduction Disutrbances:none  Narrative Interpretation: aVR elevation, nonspecific  Old EKG Reviewed: changes noted 08:15: The cardiologist is in the ED initiating evaluation and treatment, at this time  Labs Reviewed  CBC  DIFFERENTIAL  I-STAT, CHEM 8  I-STAT TROPONIN I   No results found.   1. Acute coronary syndrome       MDM abnormal EKG with worrisome clinical presentation for acute coronary syndrome. STEMI is considered, however, not called at the time of EKG evaluation. Urgent cardiology consultation sought and obtained in the emergency department. Expected disposition is Assessment of the coronary arteries with possible intervention.          Flint Melter, MD 07/02/11 [phone]

## 2011-07-02 NOTE — ED Notes (Signed)
Per EMS - given NTG x1, solumedrol 125mg , albuterol 10mg  & atrovent 0.5mg  neb. Pt reports no relief after NTG but some after neb

## 2011-07-02 NOTE — Consult Note (Signed)
Pt says she smokes 1ppd and is in action stage. Willing to use the patch. Recommended 21 mg patch x 6 weeks, 14 mg patch x 2 weeks and the 7 mg patch x 2 weeks. Discussed patch use instructions. Referred to 1-800 quit now for f/u and support. Discussed oral fixation substitutes, second hand smoke and in home smoking policy. Reviewed and gave pt Written education/contact information.

## 2011-07-02 NOTE — Progress Notes (Signed)
1245 Sheath removal to right groin. VSS prior to sheath removal. Right DP dopplered prior to removal. Manual pressure applied for 20 min. Post sheath pull VSS, Groin soft, with no edema. Right DP dopplered. Pressure dressing applied to site and attending nurse assessed. Post sheath removal teaching given to patient and she voiced understanding.

## 2011-07-02 NOTE — H&P (Signed)
Physician Discharge Summary  Patient ID: Patricia [name] MRN: [id number] DOB/AGE: [birth year] 70 y.o. Admit date: 07/02/2011  Primary Care Physician: Dr Drue Novel  Primary Cardiologist: Dr Excell Seltzer   HPI: This is a 70 year old woman presenting with resting chest pain. The chest pain began more than 1 month ago. Chest pain occurs frequently. The chest pain is worsening (She has had daily chest pain for one month. She takes nitroglycerin every day.Marland Kitchen). Associated with: No known provocation. At its most intense, the pain is at 8/10. The pain is currently at 8/10. The quality of the pain is described as aching. The pain does not radiate. Pertinent negatives for primary symptoms include no fever, no fatigue, no syncope and no cough.   The patient has a history of coronary artery disease and underwent stenting of the native left circumflex in 2011 with a drug-eluting stent platform. 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 This is been managed medically. I last saw her in the office in August and she was not having significant angina at that time.   Pertinent negatives for associated symptoms include no claudication and no near-syncope. She tried nitroglycerin and aspirin for the symptoms. Risk factors include no known risk factors.  Her past medical history is significant for CAD and MI.  Pertinent negatives for past medical history include no aneurysm and no aortic aneurysm.  Procedure history is positive for cardiac catheterization.     Review of systems complete and found to be negative unless listed above  Past Medical History  Diagnosis Date  . Carotid artery disease     moderate; asymptomatic  . CAD (coronary artery disease)     cath with stent placed  . Osteopenia   . COPD (chronic obstructive pulmonary disease)   . Hyperlipidemia   . Thyroid nodule   . Abnormal LFTs   . Depression   . HTN (hypertension)     intolerant to ACEi (cough)    . Allergic rhinitis   . Anxiety   . Panic attacks     Family History  Problem Relation Age of Onset  . Coronary artery disease Father   . Heart attack Father   . Hypertension Mother   . Stroke Neg Hx   . Colon cancer Neg Hx   . Breast cancer Neg Hx     History   Social History  . Marital Status: Widowed    Spouse Name: N/A    Number of Children: 1  . Years of Education: N/A   Occupational History  . Retired-2003    Social History Main Topics  . Smoking status: Current Some Day Smoker -- 0.5 packs/day for 58 years  . Smokeless tobacco: Not on file   Comment: smoking less since heart stent 07-2010  . Alcohol Use: Yes     socially  . Drug Use: No  . Sexually Active: Not on file   Other Topics Concern  . Not on file   Social History Narrative  . No narrative on file    Past Surgical History  Procedure Date  . H/o "floating kidney" 1960  . Abdominal hysterectomy 08/11/1973    no oophorectomy  . Coronary stent placement      Prescriptions prior to admission  Medication Sig Dispense Refill  . albuterol (PROVENTIL) (2.5 MG/3ML) 0.083% nebulizer solution Take 10 mg by nebulization every 6 (six) hours as needed.        Marland Kitchen aspirin 325 MG tablet Take  325 mg by mouth daily.        . clopidogrel (PLAVIX) 75 MG tablet Take 1 tablet (75 mg total) by mouth daily.  90 tablet  3  . DIOVAN 80 MG tablet TAKE 1 TABLET BY MOUTH EVERY DAY  90 tablet  2  . diphenhydrAMINE (SIMPLY SLEEP) 25 MG tablet Take 25 mg by mouth at bedtime as needed. For sleep      . hydrochlorothiazide (HYDRODIURIL) 25 MG tablet TAKE 1 TABLET BY MOUTH EVERY DAY  90 tablet  0  . ipratropium (ATROVENT) 0.02 % nebulizer solution Take 500 mcg by nebulization 4 (four) times daily.        Marland Kitchen loratadine (CLARITIN) 10 MG tablet Take 10 mg by mouth daily as needed. For allergies      . methylPREDNISolone sodium succinate (SOLU-MEDROL) 125 MG injection Inject 125 mg into the vein once.        . nitroGLYCERIN  (NITROLINGUAL) 0.4 MG/SPRAY spray Place 1 spray under the tongue every 5 (five) minutes as needed.        Marland Kitchen NITROSTAT 0.4 MG SL tablet DISSOLVE 1 TABLET UNDER TONGUE EVERY 5 MIN AS NEEDED MAX 3 TIMES  25 tablet  3  . simvastatin (ZOCOR) 20 MG tablet Take 20 mg by mouth at bedtime.          Physical Exam: Blood pressure 150/45, pulse 103, temperature 98 F (36.7 C), temperature source Oral, resp. rate 17, weight 123 lb (55.792 kg), SpO2 95.00%.  Current Weight  07/02/11 123 lb (55.792 kg)    Wt Readings from Last 3 Encounters:  07/02/11 123 lb (55.792 kg)  07/02/11 123 lb (55.792 kg)  04/08/11 122 lb (55.339 kg)   General: This is an anxious, thin woman in mild distress secondary to chest pain. She is in no respiratory distress. Head: Eyes PERRLA, No xanthomas.   Normal cephalic and atramatic  Lungs: Clear bilaterally to auscultation and percussion. Heart: HRRR S1 S2, without MRG.  Pulses are 2+ & equal.           bilateral carotid bruit. No JVD.  No abdominal bruits. No femoral bruits. Abdomen: Bowel sounds are positive, abdomen soft and non-tender without masses or                  Hernia's noted. Msk:  Back normal, normal gait. Normal strength and tone for age. Extremities: No clubbing, cyanosis or edema.  Pedal pulses are nonpalpable bilaterally. Femoral pulses are 1+ bilaterally Neuro: Alert and oriented X 3. Psych:  Good affect, responds appropriately  Labs:   Lab Results  Component Value Date   WBC 8.1 07/18/2010   HGB 13.6 07/18/2010   HCT 40.5 07/18/2010   MCV 91.4 07/18/2010   PLT 208 07/18/2010   No results found for this basename: NA,K,CL,CO2,BUN,CREATININE,CALCIUM,LABALBU,PROT,BILITOT,ALKPHOS,ALT,AST,GLUCOSE in the last 168 hours No results found for this basename: CKTOTAL, CKMB, CKMBINDEX, TROPONINI    Lab Results  Component Value Date   CHOL 187 08/09/2010   CHOL 183 04/08/2010   CHOL 176 07/20/2009   Lab Results  Component Value Date   HDL 68.10 08/09/2010    HDL 73.40 04/08/2010   HDL 16.10 07/20/2009   Lab Results  Component Value Date   LDLCALC 103* 08/09/2010   LDLCALC 95 04/08/2010   LDLCALC 91 07/20/2009   Lab Results  Component Value Date   TRIG 81.0 08/09/2010   TRIG 74.0 04/08/2010   TRIG 97.0 07/20/2009   Lab Results  Component Value  Date   CHOLHDL 3 08/09/2010   CHOLHDL 2 04/08/2010   CHOLHDL 3 07/20/2009   Lab Results  Component Value Date   LDLDIRECT 156.0 04/26/2007   LDLDIRECT 128.1 01/25/2007      Radiology: not done  EKG: ST/T wave changes concerning for MI  ASSESSMENT AND PLAN:  Abnormal ECG with ongoing CP ->emergent cath. Further plans depending on results. Signed: Theodore Demark 07/02/2011, 9:09 AM Co-Sign MD  I have seen and examined the patient. I agree with the documentation of Bjorn Loser Barrett with changes noted above. This is a 70 year old woman, well known to me with a history of coronary artery disease. She presents with classic symptoms of acute coronary syndrome(unstable angina). She has progressive exertional angina over one month and now has developed rest pain. She currently has 8/10 chest pain. She has dynamic EKG changes with inferolateral ST segment depression. We will perform emergent cardiac catheterization. Risks, indication, and alternatives to cardiac cath were reviewed with the patient. Emergency consent was obtained and she is in distress with ongoing ischemic symptoms. Further plans depending on the results of her cardiac catheterization.

## 2011-07-02 NOTE — ED Notes (Signed)
Family at bedside. 

## 2011-07-02 NOTE — ED Notes (Signed)
To cath lab.

## 2011-07-02 NOTE — Progress Notes (Signed)
Post-PCI. Pt is stable without chest pain. Right groin site checked and is without hematoma. Home meds reconciled. Pt on ASA/Plavix/statin/ARB. Hold beta-blocker secondary to lung disease/heavy smoking/wheezing. Will address in AM.

## 2011-07-02 NOTE — ED Notes (Signed)
Dr. Shirlee Latch at bedside. MD at bedside.

## 2011-07-02 NOTE — Procedures (Signed)
Cardiac Catheterization Procedure Note  Name: Patricia [name] MRN: [id number] DOB: 11-25-1940  Procedure: Left Heart Cath, Selective Coronary Angiography, LV angiography,  PTCA/Stent of the left circumflex/obtuse marginal bifurcation  Indication: This is a 70 year old woman with known coronary artery disease and continued tobacco abuse presenting with acute coronary syndrome and ongoing chest pain.   Diagnostic Procedure Details: The right groin was prepped, draped, and anesthetized with 1% lidocaine. Using the modified Seldinger technique, a 6 French sheath was introduced into the right femoral artery. Standard Judkins catheters were used for selective coronary angiography and left ventriculography. Catheter exchanges were performed over a wire.  The diagnostic procedure was well-tolerated without immediate complications.  PROCEDURAL FINDINGS Hemodynamics: AO 113/42 LV 113/70  Coronary angiography: Coronary dominance: right  Left mainstem: The left main stem is moderately calcified. The vessel is patent and it divides into the LAD and left circumflex.  Left anterior descending (LAD): The LAD is a large vessel that wraps the LV apex. There is moderate to heavy calcification throughout the course of the LAD. The proximal LAD after the first septal perforator has 30% stenosis. There are diffuse irregularities without high-grade stenosis. The diagonal branches are small without severe disease.  Left circumflex (LCx): The left circumflex covers a large distribution. There is a stent in the proximal circumflex with severe in-stent restenosis of 95%. This also involves the origin of the first obtuse marginal branch which has a 90-95% proximal stenosis. There is mild diffuse stenosis throughout the stented segment beyond the area of high-grade stenosis. The remaining portions of the AV groove circumflex have no high-grade disease.  Right coronary artery (RCA): The RCA has severe ostial stenosis of  95%. The vessel is subtotally occluded and fills competitively from collaterals supplied by the left coronary artery.  Left ventriculography: Left ventricular systolic function is normal, LVEF is estimated at 55-65%, there is no significant mitral regurgitation   PCI Procedure Note:  Following the diagnostic procedure, the decision was made to proceed with PCI. Weight-based bivalirudin was given for anticoagulation. Once a therapeutic ACT was achieved, a 6 [place] XB 3.5 cm guide catheter was inserted.  A cougar coronary guidewire was used to cross the lesion.  A second cougar guidewire was introduced into the obtuse marginal branch.  The lesion  in the AV circumflex within the stented segment was predilated with a 2.5 x 15 mm balloon. I tried to pass an IVIS probe after that but it would not cross. I then tried to pass a cutting balloon and it would not cross. Finally, a 3.0 x 15 mm [place] Quantum balloon was advanced and dilated to 14 atmospheres. The obtuse marginal ostium was dilated with the same 2.5 x 15 mm balloon to 10 atmospheres. Intravascular ultrasound was then performed of the stented segment in the left circumflex. This demonstrated a well-expanded stent with diffuse in-stent restenosis. There was no clear thrombus present. The lesion was then dilated with a 3.0 x 10 mm cutting balloon to 12 atmospheres on 2 inflations. Final kissing balloon angioplasty was then performed with the 3.0 x 15 mm  Quantum apex in the AV groove circumflex and a 2.5 x 15 mm Trac balloon in the obtuse marginal the AV circumflex balloon was dilated to 8 atmospheres and the OM balloon was dilated at 10 atmospheres simultaneously. There was a good angiographic result with 20% residual stenosis at both lesion sites. There was TIMI-3 flow in both lesion sites. The patient tolerated the interventional procedure well. There were no  immediate complications.  The patient was transferred to the post catheterization recovery area for  further monitoring.  A final angiogram at the femoral artery access site was performed. This demonstrated the sheath entry site in the common femoral artery. There was critical stenosis just below the sheath site leading into a totally occluded SFA and a patent deep femoral artery.  Final Conclusions:   1. 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 Nonobstructive diffuse disease in the LAD 4. Preserved overall LV function with an estimated ejection fraction of 55%. 5. Successful complex percutaneous intervention of left circumflex/obtuse marginal bifurcation with intravascular ultrasound guidance  Recommendations: The patient will be transferred to a step down unit for close monitoring. She was given 300 mg of Plavix in the cath lab. She has been on chronic Plavix. We'll continue with post MI medical therapy.  Tonny Bollman 07/02/2011, 10:19 AM

## 2011-07-02 NOTE — ED Notes (Signed)
ekg performed and given to Dr. Effie Shy

## 2011-07-02 NOTE — Progress Notes (Signed)
Patient increased SOB after going to the bedside toilet. Pt sounds tight and wheezy and c/o of being SOB. MD notified xopenex orders given TID and one administered now. PT o2 sats increased to 98% from 90-92% and pt states she feels better. Lung sounds are still rhonchus and wheezy.   BP and HR stable. Will continue to monitor.  Kileigh Ortmann, Charlaine Dalton RN

## 2011-07-02 NOTE — ED Notes (Signed)
Pt reports she feels very anxious presently.

## 2011-07-02 NOTE — ED Notes (Signed)
C/o CP onset this am, audible wheezing. Per EMS - given neb. Pt took NTG x4 without reliuef, ASA 325mg  PTA

## 2011-07-02 NOTE — ED Notes (Signed)
C/o chest heaviness & pressure onset 0300 with SOB. Denies n/v, diaphoresis. States no relief after NTG x4, took asa 325mg  prior to EMS arrival. Reports neb given by EMS improved symptoms & not NTG. Reports cold, non prod cough since Saturday with nasal congestion. Denies fever, chills.

## 2011-07-02 NOTE — ED Notes (Signed)
MD at bedside. 

## 2011-07-03 ENCOUNTER — Inpatient Hospital Stay (HOSPITAL_COMMUNITY): Payer: Medicare Other

## 2011-07-03 DIAGNOSIS — I200 Unstable angina: Secondary | ICD-10-CM

## 2011-07-03 LAB — URINALYSIS, ROUTINE W REFLEX MICROSCOPIC
Hgb urine dipstick: NEGATIVE
Protein, ur: NEGATIVE mg/dL
Urobilinogen, UA: 0.2 mg/dL (ref 0.0–1.0)

## 2011-07-03 LAB — COMPREHENSIVE METABOLIC PANEL
ALT: 18 U/L (ref 0–35)
AST: 19 U/L (ref 0–37)
Albumin: 3.4 g/dL — ABNORMAL LOW (ref 3.5–5.2)
Alkaline Phosphatase: 130 U/L — ABNORMAL HIGH (ref 39–117)
Potassium: 3.6 mEq/L (ref 3.5–5.1)
Sodium: 132 mEq/L — ABNORMAL LOW (ref 135–145)
Total Protein: 6.6 g/dL (ref 6.0–8.3)

## 2011-07-03 LAB — CBC
Platelets: 234 10*3/uL (ref 150–400)
RDW: 12.7 % (ref 11.5–15.5)
WBC: 21.1 10*3/uL — ABNORMAL HIGH (ref 4.0–10.5)

## 2011-07-03 LAB — LIPID PANEL
Cholesterol: 141 mg/dL (ref 0–200)
Total CHOL/HDL Ratio: 2 RATIO
VLDL: 11 mg/dL (ref 0–40)

## 2011-07-03 LAB — CARDIAC PANEL(CRET KIN+CKTOT+MB+TROPI)
Relative Index: INVALID (ref 0.0–2.5)
Total CK: 91 U/L (ref 7–177)

## 2011-07-03 LAB — URINE MICROSCOPIC-ADD ON

## 2011-07-03 MED ORDER — GUAIFENESIN 100 MG/5ML PO SOLN
5.0000 mL | ORAL | Status: DC | PRN
  Administered 2011-07-03 – 2011-07-04 (×2): 100 mg via ORAL
  Filled 2011-07-03 (×2): qty 5

## 2011-07-03 MED ORDER — ALBUTEROL SULFATE (5 MG/ML) 0.5% IN NEBU
2.5000 mg | INHALATION_SOLUTION | Freq: Four times a day (QID) | RESPIRATORY_TRACT | Status: DC | PRN
  Administered 2011-07-03 – 2011-07-04 (×2): 2.5 mg via RESPIRATORY_TRACT
  Filled 2011-07-03 (×2): qty 0.5

## 2011-07-03 MED ORDER — TECHNETIUM TO 99M ALBUMIN AGGREGATED
6.0000 | Freq: Once | INTRAVENOUS | Status: AC | PRN
  Administered 2011-07-03: 6 via INTRAVENOUS

## 2011-07-03 MED ORDER — XENON XE 133 GAS
10.0000 | GAS_FOR_INHALATION | Freq: Once | RESPIRATORY_TRACT | Status: AC | PRN
  Administered 2011-07-03: 10 via RESPIRATORY_TRACT

## 2011-07-03 MED FILL — Dextrose Inj 5%: INTRAVENOUS | Qty: 50 | Status: AC

## 2011-07-03 NOTE — Plan of Care (Signed)
Pt  Assisted to [facility]  To urinate but subsequently became SOB and hot and also desating low 80's, on call [facility] fellow paged x 2. New orders received and Pharm notified. Awaiting med. 02 increased to 4L, will cont to monitor closely.

## 2011-07-03 NOTE — Progress Notes (Signed)
Subjective:  No further chest pain. Remains dyspneic and anxious. Has required 5 L O2 this am to maintain adequate oxygen saturation. No fever/chills/other complaints.  Objective:  Vital Signs in the last 24 hours: Temp:  [97.5 F (36.4 C)-98.7 F (37.1 C)] 97.9 F (36.6 C) (11/22 0755) Pulse Rate:  [68-88] 88  (11/22 0700) Resp:  [16-26] 23  (11/22 0700) BP: (88-151)/(30-81) 141/44 mmHg (11/22 0000) SpO2:  [89 %-98 %] 89 % (11/22 0700)  Intake/Output from previous day: 11/21 0701 - 11/22 0700 In: 483.6 [P.O.:80; I.V.:403.6] Out: 1101 [Urine:1100; Stool:1]  Physical Exam: Pt is alert and oriented, thin woman in NAD HEENT: normal Neck: JVP - normal, carotids 2+= without bruits Lungs: Prolonged expiration, coarse breath sounds, no crackles. CV: RRR without murmur or gallop Abd: soft, NT, Positive BS, no hepatomegaly Ext: no C/C/E, distal pulses intact and equal, right groin clear Skin: warm/dry no rash  Lab Results:  Basename 07/03/11 0600 07/02/11 1124  WBC 21.1* 11.4*  HGB 13.6 13.8  PLT 234 216    Basename 07/03/11 0600 07/02/11 1534  NA 132* 132*  K 3.6 3.6  CL 94* 93*  CO2 31 28  GLUCOSE 102* 190*  BUN 15 13  CREATININE 0.50 0.60    Basename 07/02/11 2347 07/02/11 1533  TROPONINI <0.30 <0.30   Tele: Sinus rhythm   Assessment/Plan:  1. Unstable angina - s/p PCI of complex LCx bifurcation with balloon angioplasty for treatment of severe in-stent restenosis yesterday. Enzymes remain negative. Continue ASA/Plavix/benicar/simvastatin. No beta-blocker secondary to tenuous pulmonary status. 2. Hypoxemia - pt may have component of chronic lung disease with heavy tobacco history, but she has never required home O2 and dyspnea is new for her. Recommend VQ scan to rule out pulmonary embolus. Also check 2D echo to rule out pulmonary HTN or valvular heart disease. Continue scheduled Xopenex nebulizer treatments. 3. Leukocytosis - ? Stress response/demargination. Will  check a urinalysis and CXR as part of her VQ scan.   Tonny Bollman, M.D. 07/03/2011, 8:49 AM

## 2011-07-04 DIAGNOSIS — I517 Cardiomegaly: Secondary | ICD-10-CM

## 2011-07-04 DIAGNOSIS — R0902 Hypoxemia: Secondary | ICD-10-CM

## 2011-07-04 DIAGNOSIS — I200 Unstable angina: Secondary | ICD-10-CM

## 2011-07-04 DIAGNOSIS — D72829 Elevated white blood cell count, unspecified: Secondary | ICD-10-CM

## 2011-07-04 LAB — CBC
Hemoglobin: 12.8 g/dL (ref 12.0–15.0)
MCH: 31.1 pg (ref 26.0–34.0)
MCHC: 34 g/dL (ref 30.0–36.0)

## 2011-07-04 LAB — BASIC METABOLIC PANEL
BUN: 10 mg/dL (ref 6–23)
Calcium: 9.1 mg/dL (ref 8.4–10.5)
GFR calc non Af Amer: >90 mL/min (ref >90)
Glucose, Bld: 111 mg/dL — ABNORMAL HIGH (ref 70–99)
Sodium: 130 mEq/L — ABNORMAL LOW (ref 135–145)

## 2011-07-04 MED ORDER — GUAIFENESIN ER 600 MG PO TB12
1200.0000 mg | ORAL_TABLET | Freq: Two times a day (BID) | ORAL | Status: DC
  Administered 2011-07-04 – 2011-07-09 (×11): 1200 mg via ORAL
  Filled 2011-07-04 (×12): qty 2

## 2011-07-04 MED ORDER — IPRATROPIUM BROMIDE 0.02 % IN SOLN
500.0000 ug | Freq: Four times a day (QID) | RESPIRATORY_TRACT | Status: DC
  Administered 2011-07-04 – 2011-07-05 (×3): 500 ug via RESPIRATORY_TRACT
  Filled 2011-07-04 (×4): qty 2.5

## 2011-07-04 NOTE — Progress Notes (Signed)
CARDIAC REHAB PHASE I   PRE:  Rate/Rhythm: 82 SR  BP:  Supine: 125/45 Sitting:  Standing:    SaO2: 95 2L  MODE:  Ambulation: 70 ft   POST:  Rate/Rhythem: 94 SR  BP:  Supine:  Sitting: 112/48  Standing   SaO2: 93 3L  Patricia [name] [employer] received, chart reviewed. Pt ambulated 70 ft assist x 2 with rolling walker and oxygen tank on 6L. Tolerated fairly well, no complaints. Pt's SAO2 stayed in the low- to mid-nineties on 6L O2. Pt's gait was stable, but ambulated slowly. Returned to recliner, vital signs stable. Call bell in reach. Will follow up with patient. [name] [facility] 07/04/11 [id number]

## 2011-07-04 NOTE — Progress Notes (Signed)
Patient Name: Patricia [name] Date of Encounter: 07/04/2011     Active Problems:  Unstable angina  Hypoxemia  Leukocytosis    SUBJECTIVE: Pt reports improved SOB today. Denies cp, n/v, diaphoresis or palpitations. She states that she ambulated in the hall today and felt that helped her breathing. Less anxious today. Slept well overnight. Resting comfortably in chair.    OBJECTIVE  Filed Vitals:   07/04/11 0500 07/04/11 0600 07/04/11 0715 07/04/11 0800  BP:    112/48  Pulse: 85 93 73 90  Temp:   98.4 F (36.9 C)   TempSrc:   Oral   Resp: 27 32 24   Height:      Weight:      SpO2: 93% 4L via n/c 91% 4L via n/c  97% 4L via n/c 94%  4L via n/c    Intake/Output Summary (Last 24 hours) at 07/04/11 1030 Last data filed at 07/04/11 0900  Gross per 24 hour  Intake    530 ml  Output   1000 ml  Net   -470 ml   Weight change: -1.592 kg (-3 lb 8.2 oz)  PHYSICAL EXAM  General: Well developed, well nourished, in no acute distress. Speaking in 7-word sentences.  Head: Normocephalic, atraumatic, sclera non-icteric  Neck: Supple without bruits or JVD. Lungs:  Distant, clear breath sounds. Prolonged expiration. No wheezes, rales or rhonchi.  Heart: RRR no s3, s4, or murmurs, rubs, heaves, thrills or gallops.  Abdomen: Soft, non-tender, non-distended, BS + x 4.  Msk:  Strength and tone appears normal for age. Extremities: No clubbing, cyanosis or edema. DP/PT/Radials 2+ and equal bilaterally. Neuro: Alert and oriented X 3. Moves all extremities spontaneously. Psych: Normal affect.  LABS:  CBC:  Basename 07/04/11 0600 07/03/11 0600 07/02/11 1124  WBC 17.7* 21.1* --  NEUTROABS -- -- 10.9*  HGB 12.8 13.6 --  HCT 37.6 38.9 --  MCV 91.5 91.1 --  PLT 217 234 --   Basic Metabolic Panel:  Basename 07/04/11 0600 07/03/11 0600  NA 130* 132*  K 3.7 3.6  CL 89* 94*  CO2 32 31  GLUCOSE 111* 102*  BUN 10 15  CREATININE 0.50 0.50  CALCIUM 9.1 9.4  MG -- --  PHOS -- --    Liver Function Tests:  Basename 07/03/11 0600  AST 19  ALT 18  ALKPHOS 130*  BILITOT 0.2*  PROT 6.6  ALBUMIN 3.4*   Cardiac Enzymes:  Basename 07/02/11 2347 07/02/11 1533  CKTOTAL 91 65  CKMB 4.2* 3.5  CKMBINDEX -- --  TROPONINI <0.30 <0.30   BNP:  Basename 07/04/11 0600  POCBNP 548.4*   Fasting Lipid Panel:  Basename 07/03/11 0600  CHOL 141  HDL 71  LDLCALC 59  TRIG 55  CHOLHDL 2.0  LDLDIRECT --   Thyroid Function Tests:  Basename 07/03/11 0600  TSH 0.480  T4TOTAL --  T3FREE --  THYROIDAB --   Anemia Panel: No results found for this basename: VITAMINB12,FOLATE,FERRITIN,TIBC,IRON,RETICCTPCT in the last 72 hours  TELE: NSR, trace PACs,   ECG None reported   Radiology/Studies:  Nm Pulmonary Per & Vent  07/03/2011  *RADIOLOGY REPORT*  Clinical Data:  Hypoxemia and shortness of breath.  NUCLEAR MEDICINE VENTILATION - PERFUSION LUNG SCAN  Technique:  Wash-in, equilibrium, and wash-out phase ventilation images were obtained using Xe-133 gas.  Perfusion images were obtained in multiple projections after intravenous injection of Tc- 31m MAA.  Radiopharmaceuticals:  10 mCi Xe-133 gas and 6 mCi Tc-65m MAA.  Comparison:  Plain film of the chest   07/02/2011.  Findings: Radiotracer distribution is mildly patchy on perfusion imaging.  No segmental or subsegmental defect is identified. Ventilation imaging demonstrates prompt wash in and washout of radiotracer.  IMPRESSION: Low probability for pulmonary embolus.  Original Report Authenticated By: Bernadene Bell. Maricela Curet, M.D.   Dg Chest Port 1 View  07/02/2011  *RADIOLOGY REPORT*  Clinical Data: Chest pain.  Recent catheterization.  PORTABLE CHEST - 1 VIEW  Comparison: 07/02/2010  Findings: Single view of the chest demonstrates clear lungs.  No evidence for airspace disease or edema.  Atherosclerotic calcifications involving the aortic arch.  Heart size is normal.  IMPRESSION: No acute chest findings.  Original Report  Authenticated By: Richarda Overlie, M.D.    Current Medications:     . aspirin  81 mg Oral Daily  . clopidogrel  75 mg Oral Q breakfast  . hydrochlorothiazide  25 mg Oral Daily  . levalbuterol  0.63 mg Nebulization TID  . olmesartan  10 mg Oral Daily  . simvastatin  20 mg Oral QHS    ASSESSMENT AND PLAN:  1. 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 CEs negative. Will continue ASA/Plavix/Benicar/Simvistatin without BB therapy due lung disease and smoking history. Transfer to tele bed.   2. Hypoxemia- SpO2 91-100% today, low probability for PE on VQ scan, CXR unremarkable; awaiting ECHO results, likely due to anxiety and COPD; will continue to watch O2 sat and respiratory rate, if worsens, will reorder outpatient Atrovent, BNP mildly elevated, will start on Lasix. Would recommend outpatient pulmonary eval and PFTs.   3. Leukocytosis- afebrile, clean u/a, CXR unremarkable, trending down today, will continue to monitor    Signed, ARGUELLO, ROGER , PA-C  Patient seen and evaluated. Agree with the note above. Please see my progress note for details.

## 2011-07-04 NOTE — Progress Notes (Signed)
  Echocardiogram 2D Echocardiogram has been performed.  Xhaiden Coombs, Real Cons 07/04/2011, 3:24 PM

## 2011-07-04 NOTE — Progress Notes (Signed)
Subjective:  Remains dyspneic with exertion, but overall feels a little better. No chest pain. No other complaints.  Objective:  Vital Signs in the last 24 hours: Temp:  [98 F (36.7 C)-98.9 F (37.2 C)] 98.4 F (36.9 C) (11/23 0715) Pulse Rate:  [73-105] 90  (11/23 0800) Resp:  [18-32] 24  (11/23 0715) BP: (112-132)/(39-48) 112/48 mmHg (11/23 0800) SpO2:  [91 %-100 %] 94 % (11/23 0800) Weight:  [54.2 kg (119 lb 7.8 oz)] 119 lb 7.8 oz (54.2 kg) (11/23 0400)  Intake/Output from previous day: 11/22 0701 - 11/23 0700 In: 410 [P.O.:410] Out: 1050 [Urine:1050]  Physical Exam: Pt is alert and oriented, NAD HEENT: normal Neck: JVP - normal, carotids 2+= without bruits Lungs: Prolonged expiration with diminished air movement bilaterally. CV: RRR without murmur or gallop Abd: soft, NT, Positive BS, no hepatomegaly Ext: no C/C/E, distal pulses intact and equal Skin: warm/dry no rash   Lab Results:  Basename 07/04/11 0600 07/03/11 0600  WBC 17.7* 21.1*  HGB 12.8 13.6  PLT 217 234    Basename 07/04/11 0600 07/03/11 0600  NA 130* 132*  K 3.7 3.6  CL 89* 94*  CO2 32 31  GLUCOSE 111* 102*  BUN 10 15  CREATININE 0.50 0.50    Basename 07/02/11 2347 07/02/11 1533  TROPONINI <0.30 <0.30    Cardiac Studies: Echo Pending  Tele: Sinus rhythm  Assessment/Plan:  1. Unstable angina - improved after PCI. Continue ASA and plavix. 2. Hyoxemia - suspect significant underlying COPD. VQ scan was negative. With elevated BNP will give a dose of IV lasix today to see if clinical improvement with diuresis. May need to assess for home O2. 3. Leukocytosis - improving. No clear source. 4. Dispo - tx tele today and consider discharge in next 24-48 hours.  Tonny Bollman, M.D. 07/04/2011, 11:55 AM

## 2011-07-04 NOTE — Progress Notes (Signed)
UR Completed.  [name] Jane [phone] 07/04/2011  

## 2011-07-05 ENCOUNTER — Inpatient Hospital Stay (HOSPITAL_COMMUNITY): Payer: Medicare Other

## 2011-07-05 DIAGNOSIS — J441 Chronic obstructive pulmonary disease with (acute) exacerbation: Secondary | ICD-10-CM

## 2011-07-05 DIAGNOSIS — J9819 Other pulmonary collapse: Secondary | ICD-10-CM

## 2011-07-05 DIAGNOSIS — F17200 Nicotine dependence, unspecified, uncomplicated: Secondary | ICD-10-CM

## 2011-07-05 DIAGNOSIS — R0902 Hypoxemia: Secondary | ICD-10-CM

## 2011-07-05 MED ORDER — IPRATROPIUM BROMIDE 0.02 % IN SOLN
0.5000 mg | Freq: Four times a day (QID) | RESPIRATORY_TRACT | Status: DC
  Administered 2011-07-05 (×2): 0.5 mg via RESPIRATORY_TRACT
  Filled 2011-07-05: qty 2.5

## 2011-07-05 MED ORDER — BUDESONIDE 0.25 MG/2ML IN SUSP
0.2500 mg | Freq: Four times a day (QID) | RESPIRATORY_TRACT | Status: DC
  Administered 2011-07-05 (×2): 0.25 mg via RESPIRATORY_TRACT
  Filled 2011-07-05 (×4): qty 2

## 2011-07-05 MED ORDER — ALBUTEROL SULFATE (5 MG/ML) 0.5% IN NEBU: 2.5000 mg | INHALATION_SOLUTION | RESPIRATORY_TRACT | Status: DC

## 2011-07-05 NOTE — Progress Notes (Signed)
Subjective:  Dyspnea persists with activity. Comfortable at rest, feels better on O2 by facemask.  Objective:  Vital Signs in the last 24 hours: Temp:  [97.9 F (36.6 C)-99.6 F (37.6 C)] 98.5 F (36.9 C) (11/24 1153) Pulse Rate:  [96] 96  (11/23 2036) Resp:  [20-22] 22  (11/24 0746) BP: (101-135)/(32-87) 120/32 mmHg (11/24 0400) SpO2:  [79 %-97 %] 94 % (11/24 0841) FiO2 (%):  [2 %] 2 % (11/23 1250)  Intake/Output from previous day: 11/23 0701 - 11/24 0700 In: 720 [P.O.:720] Out: 1326 [Urine:1325; Stool:1]  Physical Exam: Pt is alert and oriented, NAD on Venturi mask. O2 sats about 95% currently HEENT: normal Neck: JVP - normal, carotids 2+= without bruits Lungs: Poor bilateral air movement with prolonged expiration CV: RRR without murmur or gallop Abd: soft, NT, Positive BS, no hepatomegaly Ext: no C/C/E, distal pulses intact and equal Skin: warm/dry no rash   Lab Results:  Basename 07/04/11 0600 07/03/11 0600  WBC 17.7* 21.1*  HGB 12.8 13.6  PLT 217 234    Basename 07/04/11 0600 07/03/11 0600  NA 130* 132*  K 3.7 3.6  CL 89* 94*  CO2 32 31  GLUCOSE 111* 102*  BUN 10 15  CREATININE 0.50 0.50    Basename 07/02/11 2347 07/02/11 1533  TROPONINI <0.30 <0.30    Cardiac Studies: 2D Echo: Study Conclusions  Left ventricle: The cavity size was normal. Wall thickness was increased in a pattern of mild LVH. Systolic function was normal. The estimated ejection fraction was in the range of 60% to 65%. Wall motion was normal; there were no regional wall motion abnormalities.  Tele: Sinus rhythm no significant arrhythmia.  Assessment/Plan:  1. Unstable angina. LVEF is normal by echo. Pt appears stable without further ischemic symptoms on ASA and plavix. No beta-blocker secondary secondary to presumed severe COPD and marginal respiratory status. Continue statin drug. 2. Hypoxemia. VQ scan was low probability and CXR from 11/21 showed no acute abnormality. Will  repeat CXR today. I have asked for a pulmonary consult to help with management - question COPD exacerbation. 3. Dispo. Needs to stay in TCU today. Check stat labs and portable CXR. Pulm consult pending.   Tonny Bollman, M.D. 07/05/2011, 12:02 PM

## 2011-07-05 NOTE — Consult Note (Signed)
Reason for Consult:Hypoxia Referring Physician: Excell Seltzer  This is a 70 year old woman presenting with resting chest pain. The chest pain began more than 1 month ago. She has been a life long smoker of 1PPD since age 8. Since her admit and evaluation for CAD she has been O2 dependent although on 1124 @1310  (tme of pul eval) she has taken her O2 off to eat and sats are 92%. PCCM asked to evaluate for hypoxia.  Lines/tubes:    Microbiology/Sepsis markers: Results for orders placed during the hospital encounter of 07/02/11  MRSA PCR SCREENING     Status: Normal   Collection Time   07/02/11 12:49 PM      Component Value Range Status Comment   MRSA by PCR NEGATIVE  NEGATIVE  Final     Anti-infectives:  Anti-infectives    None       11/24 pulmonary  Consults:      Studies: Nm Pulmonary Per & Vent  07/03/2011  *RADIOLOGY REPORT*  Clinical Data:  Hypoxemia and shortness of breath.  NUCLEAR MEDICINE VENTILATION - PERFUSION LUNG SCAN  Technique:  Wash-in, equilibrium, and wash-out phase ventilation images were obtained using Xe-133 gas.  Perfusion images were obtained in multiple projections after intravenous injection of Tc- 29m MAA.  Radiopharmaceuticals:  10 mCi Xe-133 gas and 6 mCi Tc-51m MAA.  Comparison:  Plain film of the chest 07/02/2011.  Findings: Radiotracer distribution is mildly patchy on perfusion imaging.  No segmental or subsegmental defect is identified. Ventilation imaging demonstrates prompt wash in and washout of radiotracer.  IMPRESSION: Low probability for pulmonary embolus.  Original Report Authenticated By: Bernadene Bell. Maricela Curet, M.D.   Dg Chest Port 1 View  07/05/2011  *RADIOLOGY REPORT*  Clinical Data: Hypoxemia  PORTABLE CHEST - 1 VIEW  Comparison: 07/02/2011  Findings: New airspace opacities in both lung bases, left greater than right.  Heart size upper limits normal for technique.  Small left pleural effusion.  No overt interstitial edema.  Atheromatous aortic  arch.  IMPRESSION:  1.  New bibasilar airspace infiltrates, left greater than right. 2.  Small left pleural effusion.  Original Report Authenticated By: Osa Craver, M.D.   Dg Chest Port 1 View  07/02/2011  *RADIOLOGY REPORT*  Clinical Data: Chest pain.  Recent catheterization.  PORTABLE CHEST - 1 VIEW  Comparison: 07/02/2010  Findings: Single view of the chest demonstrates clear lungs.  No evidence for airspace disease or edema.  Atherosclerotic calcifications involving the aortic arch.  Heart size is normal.  IMPRESSION: No acute chest findings.  Original Report Authenticated By: Richarda Overlie, M.D.     Events:    Past Medical History  Diagnosis Date  . Carotid artery disease     moderate; asymptomatic  . CAD (coronary artery disease)     cath with stent placed  . Osteopenia   . COPD (chronic obstructive pulmonary disease)   . Hyperlipidemia   . Thyroid nodule   . Abnormal LFTs   . Depression   . HTN (hypertension)     intolerant to ACEi (cough)  . Allergic rhinitis   . Anxiety   . Panic attacks     Past Surgical History  Procedure Date  . H/o "floating kidney" 1960  . Abdominal hysterectomy 08/11/1973    no oophorectomy  . Coronary stent placement     Family History  Problem Relation Age of Onset  . Coronary artery disease Father   . Heart attack Father   . Hypertension Mother   .  Stroke Neg Hx   . Colon cancer Neg Hx   . Breast cancer Neg Hx     Social History:  reports that she has been smoking.  She does not have any smokeless tobacco history on file. She reports that she drinks alcohol. She reports that she does not use illicit drugs.  Allergies: No Known Allergies  Medications: I have reviewed the patient's current medications.  LABS No results found for this or any previous visit (from the past 24 hour(s)).  Diagnostics @RISRSLT24 @  ROS: Negative for FCS, No sputum production.  ++chest pain. Blood pressure 120/32, pulse 96, temperature 98.5  F (36.9 C), temperature source Oral, resp. rate 22, height 5\' 2"  (1.575 m), weight 119 lb 7.8 oz (54.2 kg), SpO2 94.00%. Exam:  [place]: Intact Chest: mild rhonchi and some exp wheeze with forced expiration. Cor HSR Abd: + bs Ext: warm Assessment and Plan: 1. Hypoxia in setting of 53 years of 1 PPD tob. Probable ATX. A. Pulmonary toilet with IS/Flutter valve B. BD's C. CxR follow up 2. [place] per cards     Candie Chroman 07/05/2011, 1:05 PM

## 2011-07-05 NOTE — Progress Notes (Signed)
CARDIAC REHAB PHASE I   PRE:  Rate/Rhythm:90 SR  BP:  Supine:  Sitting:  114/42  Standing:    SaO2: 95 4L  MODE:  Ambulation: 170  ft   POST:  Rate/Rhythem: 98 SR  BP:  Supine:   Sitting: 84/47  Standing:    SaO2: 93 4L  Brogen Duell Taylor  Pt ambulated 170 ft assist x 2 with rolling walker and 4L O2. Pt tolerated ambulation well, no complaints. No dyspnea reported, SAO2 remained 92-93% on 4L throughout ambulation. No rest breaks. Returned to [employer] Post-ambulation BP was 84/47, pt asymptomatic, recheck 96/43. Pt's RN notified of post-ambulation BP. Call bell in reach. Will follow up. [name] [facility] 07/05/11 1530

## 2011-07-06 DIAGNOSIS — R0602 Shortness of breath: Secondary | ICD-10-CM

## 2011-07-06 LAB — COMPREHENSIVE METABOLIC PANEL
ALT: 16 U/L (ref 0–35)
Alkaline Phosphatase: 119 U/L — ABNORMAL HIGH (ref 39–117)
CO2: 35 mEq/L — ABNORMAL HIGH (ref 19–32)
Calcium: 8.9 mg/dL (ref 8.4–10.5)
Chloride: 86 mEq/L — ABNORMAL LOW (ref 96–112)
GFR calc Af Amer: 70 mL/min — ABNORMAL LOW (ref >90)
GFR calc non Af Amer: 60 mL/min — ABNORMAL LOW (ref >90)
Glucose, Bld: 105 mg/dL — ABNORMAL HIGH (ref 70–99)
Sodium: 125 mEq/L — ABNORMAL LOW (ref 135–145)
Total Bilirubin: 0.4 mg/dL (ref 0.3–1.2)

## 2011-07-06 LAB — CBC
Hemoglobin: 10.9 g/dL — ABNORMAL LOW (ref 12.0–15.0)
MCHC: 32.6 g/dL (ref 30.0–36.0)
Platelets: 209 10*3/uL (ref 150–400)
RDW: 12.6 % (ref 11.5–15.5)

## 2011-07-06 MED ORDER — IPRATROPIUM BROMIDE 0.02 % IN SOLN: 0.5000 mg | Freq: Three times a day (TID) | RESPIRATORY_TRACT | Status: DC

## 2011-07-06 MED ORDER — CLONAZEPAM 0.5 MG PO TABS
0.5000 mg | ORAL_TABLET | Freq: Two times a day (BID) | ORAL | Status: DC
  Administered 2011-07-06 – 2011-07-09 (×7): 0.5 mg via ORAL
  Filled 2011-07-06 (×7): qty 1

## 2011-07-06 MED ORDER — ALBUTEROL SULFATE (5 MG/ML) 0.5% IN NEBU: 2.5000 mg | INHALATION_SOLUTION | RESPIRATORY_TRACT | Status: DC

## 2011-07-06 MED ORDER — IPRATROPIUM BROMIDE 0.02 % IN SOLN
0.5000 mg | Freq: Three times a day (TID) | RESPIRATORY_TRACT | Status: DC
  Administered 2011-07-06: 0.5 mg via RESPIRATORY_TRACT
  Filled 2011-07-06: qty 2.5

## 2011-07-06 MED ORDER — FUROSEMIDE 10 MG/ML IJ SOLN
40.0000 mg | Freq: Two times a day (BID) | INTRAMUSCULAR | Status: DC
  Administered 2011-07-06 (×2): 40 mg via INTRAVENOUS
  Filled 2011-07-06 (×3): qty 4

## 2011-07-06 MED ORDER — ALBUTEROL SULFATE (5 MG/ML) 0.5% IN NEBU
2.5000 mg | INHALATION_SOLUTION | Freq: Four times a day (QID) | RESPIRATORY_TRACT | Status: DC
  Administered 2011-07-06 – 2011-07-09 (×11): 2.5 mg via RESPIRATORY_TRACT
  Filled 2011-07-06 (×12): qty 0.5

## 2011-07-06 MED ORDER — BUDESONIDE 0.25 MG/2ML IN SUSP
0.2500 mg | Freq: Two times a day (BID) | RESPIRATORY_TRACT | Status: DC
  Administered 2011-07-06: 0.25 mg via RESPIRATORY_TRACT
  Filled 2011-07-06 (×3): qty 2

## 2011-07-06 MED ORDER — ALBUTEROL SULFATE (5 MG/ML) 0.5% IN NEBU
2.5000 mg | INHALATION_SOLUTION | Freq: Three times a day (TID) | RESPIRATORY_TRACT | Status: DC
  Administered 2011-07-06: 2.5 mg via RESPIRATORY_TRACT
  Filled 2011-07-06: qty 0.5

## 2011-07-06 MED ORDER — ALBUTEROL SULFATE (5 MG/ML) 0.5% IN NEBU: 2.5000 mg | INHALATION_SOLUTION | RESPIRATORY_TRACT | Status: DC | PRN

## 2011-07-06 MED ORDER — DOXYCYCLINE HYCLATE 100 MG PO TABS
100.0000 mg | ORAL_TABLET | Freq: Two times a day (BID) | ORAL | Status: DC
  Administered 2011-07-06 – 2011-07-09 (×7): 100 mg via ORAL
  Filled 2011-07-06 (×8): qty 1

## 2011-07-06 MED ORDER — BUDESONIDE 0.25 MG/2ML IN SUSP
0.2500 mg | Freq: Four times a day (QID) | RESPIRATORY_TRACT | Status: DC
  Administered 2011-07-06 – 2011-07-07 (×4): 0.25 mg via RESPIRATORY_TRACT
  Filled 2011-07-06 (×7): qty 2

## 2011-07-06 MED ORDER — PREDNISONE 20 MG PO TABS
40.0000 mg | ORAL_TABLET | Freq: Every day | ORAL | Status: DC
  Administered 2011-07-06 – 2011-07-07 (×2): 40 mg via ORAL
  Filled 2011-07-06 (×3): qty 2

## 2011-07-06 MED ORDER — IPRATROPIUM BROMIDE 0.02 % IN SOLN
0.5000 mg | Freq: Four times a day (QID) | RESPIRATORY_TRACT | Status: DC
  Administered 2011-07-06 – 2011-07-09 (×11): 0.5 mg via RESPIRATORY_TRACT
  Filled 2011-07-06 (×12): qty 2.5

## 2011-07-06 NOTE — Progress Notes (Signed)
Subjective:  No chest pain or pressure. Episodes of dyspnea and hypoxemia overnight requiring morphine, increased O2, and valium.  Objective:  Vital Signs in the last 24 hours: Temp:  [97.6 F (36.4 C)-99.3 F (37.4 C)] 97.6 F (36.4 C) (11/25 0754) Resp:  [18-26] 18  (11/25 0400) BP: (136-144)/(41-53) 136/53 mmHg (11/25 0400) SpO2:  [88 %-100 %] 100 % (11/25 0803) FiO2 (%):  [35 %-100 %] 100 % (11/25 0803)  Intake/Output from previous day: 11/24 0701 - 11/25 0700 In: 240 [P.O.:240] Out: 350 [Urine:350]  Physical Exam: Pt is alert and oriented, NAD HEENT: normal Neck: JVP - normal, carotids 2+= without bruits Lungs: Coarse bilateral BS with decreased air movement. CV: RRR without murmur or gallop, distant Abd: soft, NT, Positive BS, no hepatomegaly Ext: no C/C/E, distal pulses intact and equal Skin: warm/dry no rash   Lab Results:  Basename 07/06/11 0630 07/04/11 0600  WBC 15.4* 17.7*  HGB 10.9* 12.8  PLT 209 217    Basename 07/06/11 0630 07/04/11 0600  NA 125* 130*  K 4.2 3.7  CL 86* 89*  CO2 35* 32  GLUCOSE 105* 111*  BUN 26* 10  CREATININE 0.93 0.50   No results found for this basename: TROPONINI:2,CK,MB:2 in the last 72 hours  Tele: sinus rhythm  CXR 11/24: IMPRESSION:  1. New bibasilar airspace infiltrates, left greater than right.  2. Small left pleural effusion   Assessment/Plan:  1. Unstable angina - remains stable on current med Rx. No changes today. 2. Hypoxemia/chronic lung disease/heavy tobacco - appreciate pulmonary eval. With elevated BNP will give an additional dose of furosemide but will need to watch sodium closely. She has coarse BS but not a lot of wheezing - I don't know if she would benefit from steroid administration? Will defer to pulmonary. Will also put her on scheduled anxiolytic as she has a great deal of underlying anxiety. 3. Hyponatremia - does not appear overtly volume overloaded. Repeat sodium level tomorrow. 4. Dispo - work  towards discharge in next 24-48 hours, but need to decrease O2 requirements. Significant underlying anxiety complicates her clinical picture. Suspect she will need home 02.   Tonny Bollman, M.D. 07/06/2011, 8:28 AM

## 2011-07-06 NOTE — Consult Note (Signed)
Pt seen and examined and database reviewed. I agree with above findings, assessment and plan  David Simonds, MD;  PCCM service; Mobile [phone]  

## 2011-07-06 NOTE — Progress Notes (Signed)
Reason for Consult:Hypoxia Referring Physician: Excell Seltzer  70 yowf adm 11/21 to [facility] with DOE and exertional CP. 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 Noted to have persistent hypoxemia. V/Q revealed no significant perfusion defects. CXR revealed NAD. PCCM asked to evaluate for hypoxic resp failure. Pt has extensive smoking hx. No prior documented COPD. At her baseline, she has class II dyspnea.   Subj: Denies CP or dyspnea presently. Remains on 50% VM with SpO2 in low to mid 90s  Filed Vitals:   07/06/11 1549  BP:   Pulse:   Temp: 98.8 F (37.1 C)  Resp:    Filed Vitals:   07/06/11 0754 07/06/11 0803 07/06/11 1153 07/06/11 1549  BP:      Pulse:      Temp: 97.6 F (36.4 C)  98.7 F (37.1 C) 98.8 F (37.1 C)  TempSrc: Oral  Oral Oral  Resp:      Height:      Weight:      SpO2:  100% 96%     Gen: Pleasant, well-nourished, in no distress,  normal affect. Rattling cough noted ENT: WNL Neck: No JVD Lungs: No use of accessory muscles, no overt wheezing, no rales, BS mildly diminished, scattered rhonchi Cardiovascular: RRR, heart sounds normal, no murmur or gallops, no peripheral edema Abdomen: soft and NT, no HSM,  BS normal Musculoskeletal: No deformities, no cyanosis or clubbing Neuro: alert, non focal Skin: Warm, no lesions or rashes  Dg Chest Port 1 View  07/05/2011  *RADIOLOGY REPORT*  Clinical Data: Hypoxemia  PORTABLE CHEST - 1 VIEW  Comparison: 07/02/2011  Findings: New airspace opacities in both lung bases, left greater than right.  Heart size upper limits normal for technique.  Small left pleural effusion.  No overt interstitial edema.  Atheromatous aortic arch.  IMPRESSION:  1.  New bibasilar airspace infiltrates, left greater than right. 2.  Small left pleural effusion.  Original Report Authenticated By: Osa Craver, M.D.    LABS Results for orders placed during the hospital encounter of 07/02/11 (from the  past 24 hour(s))  COMPREHENSIVE METABOLIC PANEL     Status: Abnormal   Collection Time   07/06/11  6:30 AM      Component Value Range   Sodium 125 (*) 135 - 145 (mEq/L)   Potassium 4.2  3.5 - 5.1 (mEq/L)   Chloride 86 (*) 96 - 112 (mEq/L)   CO2 35 (*) 19 - 32 (mEq/L)   Glucose, Bld 105 (*) 70 - 99 (mg/dL)   BUN 26 (*) 6 - 23 (mg/dL)   Creatinine, Ser 1.61  0.50 - 1.10 (mg/dL)   Calcium 8.9  8.4 - 09.6 (mg/dL)   Total Protein 6.1  6.0 - 8.3 (g/dL)   Albumin 2.4 (*) 3.5 - 5.2 (g/dL)   AST 17  0 - 37 (U/L)   ALT 16  0 - 35 (U/L)   Alkaline Phosphatase 119 (*) 39 - 117 (U/L)   Total Bilirubin 0.4  0.3 - 1.2 (mg/dL)   GFR calc non Af Amer 60 (*) >90 (mL/min)   GFR calc Af Amer 70 (*) >90 (mL/min)  CBC     Status: Abnormal   Collection Time   07/06/11  6:30 AM      Component Value Range   WBC 15.4 (*) 4.0 - 10.5 (K/uL)   RBC 3.67 (*) 3.87 - 5.11 (MIL/uL)   Hemoglobin 10.9 (*) 12.0 - 15.0 (g/dL)  HCT 33.4 (*) 36.0 - 46.0 (%)   MCV 91.0  78.0 - 100.0 (fL)   MCH 29.7  26.0 - 34.0 (pg)   MCHC 32.6  30.0 - 36.0 (g/dL)   RDW 16.1  09.6 - 04.5 (%)   Platelets 209  150 - 400 (K/uL)    Assessment and Plan: 1. Persistent hypoxia - Likely some component of COPD. Will treat as mild acute exacerbation with continued BDs, add doxycycline and Prednisone. Wean O2 as tolerated. Might require home O2 2. CAD - mgmt per cards. OK to use cardioselective beta blocker. Would avoid Carvedilol which is nonselective 3. Smoker - counseled re: need for cessation  Billy Fischer 07/06/2011, 4:02 PM

## 2011-07-07 ENCOUNTER — Inpatient Hospital Stay (HOSPITAL_COMMUNITY): Payer: Medicare Other

## 2011-07-07 DIAGNOSIS — J441 Chronic obstructive pulmonary disease with (acute) exacerbation: Secondary | ICD-10-CM

## 2011-07-07 DIAGNOSIS — R0602 Shortness of breath: Secondary | ICD-10-CM

## 2011-07-07 DIAGNOSIS — F17200 Nicotine dependence, unspecified, uncomplicated: Secondary | ICD-10-CM

## 2011-07-07 DIAGNOSIS — R0902 Hypoxemia: Secondary | ICD-10-CM

## 2011-07-07 LAB — BASIC METABOLIC PANEL
Calcium: 9.3 mg/dL (ref 8.4–10.5)
GFR calc Af Amer: 84 mL/min — ABNORMAL LOW (ref >90)
GFR calc non Af Amer: 72 mL/min — ABNORMAL LOW (ref >90)
Potassium: 3.5 mEq/L (ref 3.5–5.1)
Sodium: 131 mEq/L — ABNORMAL LOW (ref 135–145)

## 2011-07-07 MED ORDER — PREDNISONE 20 MG PO TABS
30.0000 mg | ORAL_TABLET | Freq: Every day | ORAL | Status: DC
  Administered 2011-07-08 – 2011-07-09 (×2): 30 mg via ORAL
  Filled 2011-07-07 (×3): qty 1

## 2011-07-07 MED ORDER — FUROSEMIDE 10 MG/ML IJ SOLN
40.0000 mg | Freq: Every day | INTRAMUSCULAR | Status: DC
  Administered 2011-07-07: 40 mg via INTRAVENOUS
  Filled 2011-07-07: qty 4

## 2011-07-07 MED ORDER — POTASSIUM CHLORIDE CRYS ER 20 MEQ PO TBCR
20.0000 meq | EXTENDED_RELEASE_TABLET | Freq: Two times a day (BID) | ORAL | Status: DC
  Administered 2011-07-07 – 2011-07-09 (×5): 20 meq via ORAL
  Filled 2011-07-07 (×6): qty 1

## 2011-07-07 MED ORDER — BUDESONIDE 0.25 MG/2ML IN SUSP
0.2500 mg | Freq: Two times a day (BID) | RESPIRATORY_TRACT | Status: DC
  Administered 2011-07-07 – 2011-07-09 (×4): 0.25 mg via RESPIRATORY_TRACT
  Filled 2011-07-07 (×6): qty 2

## 2011-07-07 MED FILL — Albuterol Sulfate Soln Nebu 0.083% (2.5 MG/3ML): RESPIRATORY_TRACT | Qty: 3 | Status: AC

## 2011-07-07 MED FILL — Methylprednisolone Sod Succ For Inj 125 MG (Base Equiv): INTRAMUSCULAR | Qty: 1 | Status: AC

## 2011-07-07 MED FILL — Ipratropium-Albuterol Nebu Soln 0.5-2.5(3) MG/3ML: RESPIRATORY_TRACT | Qty: 3 | Status: AC

## 2011-07-07 NOTE — Progress Notes (Signed)
Subjective: Patricia [name] admits that she will likely need home O2. Her grandson lives with her and she has 2 nieces nearby to help after she is discharged. She feels that she is getting stronger slowly. She denies chest pain over the last 24 hours, and feels that her breathing is gradually improving.  Objective: Filed Vitals:   07/07/11 0015 07/07/11 0300 07/07/11 0400 07/07/11 0403  BP:   105/41   Pulse:      Temp: 97.4 F (36.3 C)   97.7 F (36.5 C)  TempSrc: Oral   Oral  Resp:   20   Height:      Weight:      SpO2:  94% 92%    Weight change:  pending  Intake/Output Summary (Last 24 hours) at 07/07/11 0653 Last data filed at 07/07/11 0300  Gross per 24 hour  Intake    500 ml  Output   1851 ml  Net  -1351 ml    General: Alert, awake, oriented x3, in no acute distress.  HEENT: No bruits, JVD is elevated at approximately 10 cm  Heart: Regular rate and rhythm, without murmurs, rubs, gallops.  Lungs: Decreased breath sounds bilaterally but no crackles or wheezes are noted.  Abdomen: Soft, nontender, nondistended, positive bowel sounds.  Neuro: Grossly intact, nonfocal. Extrem: No edema, distal pulses intact   Lab Results:  Basename 07/07/11 0430 07/06/11 0630  NA 131* 125*  K 3.5 4.2  CL 88* 86*  CO2 34* 35*  GLUCOSE 141* 105*  BUN 41* 26*  CREATININE 0.80 0.93  CALCIUM 9.3 8.9  MG -- --  PHOS -- --    Basename 07/06/11 0630  AST 17  ALT 16  ALKPHOS 119*  BILITOT 0.4  PROT 6.1  ALBUMIN 2.4*    Basename 07/06/11 0630  WBC 15.4*  NEUTROABS --  HGB 10.9*  HCT 33.4*  MCV 91.0  PLT 209    Studies/Results:  Chest X Ray (07/07/11) 1. Persistent left basilar airspace opacity, likely infiltrate. 2. Right lower lobe subsegmental atelectasis.    Medications:  I have reviewed the patient's current medications. Scheduled:   . ipratropium  0.5 mg Nebulization QID  . albuterol  2.5 mg Nebulization QID  . aspirin  81 mg Oral Daily  . budesonide  0.25 mg  Nebulization QID  . clonazePAM  0.5 mg Oral BID  . clopidogrel  75 mg Oral Q breakfast  . doxycycline  100 mg Oral Q12H  . furosemide  40 mg Intravenous Q12H  . guaiFENesin  1,200 mg Oral BID  . hydrochlorothiazide  25 mg Oral Daily  . olmesartan  10 mg Oral Daily  . predniSONE  40 mg Oral QAC breakfast  . simvastatin  20 mg Oral QHS     Patient Active Hospital Problem List: Hypoxemia (07/04/2011)   Assessment: Patient is currently on a Ventimask and maintaining O2 saturations of approximately 93% on this. She has had nebulizers adjusted per pulmonary. They're currently managing her oxygenation. She will likely need home O2.  I will discuss any change in antibiotics with CCM  Unstable angina (07/04/2011)   Assessment: No current anginal symptoms, continue aspirin and Plavix. Per pulmonary a selective beta blocker can be added, consider Toprol-XL 25 mg daily once her respiratory status is stabilized   Leukocytosis (07/04/2011)   Assessment: Her white count is trending down but she is on steroids. She is afebrile and we will follow this.   Anemia Assessment: This is mild and may be  secondary to blood draws. We will recheck in a.m. guaiac all stools  Hyponatremia Assessment: This is improved from yesterday and we will recheck in a.m.  Volume overload Assessment: She is still volume overloaded by exam. She is currently on Lasix 40 mg IV twice a day. We will continue this and make sure to get daily weights as well as strict I.'s and O.'s. Hopefully she can be changed to by mouth in the next 24-48 hours. We will hold the HCTZ since she is on IV Lasix.  Borderline hypokalemia Assessment: We will supplement to keep her potassium greater than or equal to 3.5, recheck in a.m.  Anxiety Assessment: She has underlying anxiety and is currently on scheduled Klonopin. Consider changing albuterol to Xopenex to reduce agitation from nebulizers, will leave this to M.D.   LOS: 5 days   Patricia [name] 07/07/2011, 6:53 AM  History rereviewed with the patient, no changes to be made except for chest Xray results updated.  Continued infiltrate noted.  The patient exam reveals decreased BS without wheezing.  All available labs, radiology testing, previous records reviewed. Agree with documented assessment and plan except I will reduce lasix slightly as BUN is rising.  The primary issue currently is her oxygenation and dyspnea.   Patricia [name]  8:27 AM 06/27/2011

## 2011-07-07 NOTE — Progress Notes (Signed)
CARDIAC REHAB PHASE I   PRE:  Rate/Rhythm: 75 SR    BP: sitting 123/44    SaO2: 96 with breathing tx.  MODE:  Ambulation: 270 ft   POST:  Rate/Rhythm: 114 ST    BP: sitting 130/48     SaO2: 85 4L, 92 back on venti mask  Pt had stool in bed.  To BSC to urinate before walk.  Pt weak and groggy.  Did not know stool was in bed.  Coughed incessantly during walk.  Denied SOB but SaO2 low on 4L O2 (had walked on 4L on Saturday without hypoxia).  Used RW and assist x2 and 4L to walk. To recliner, left on ventimask, saturations 89 on 50%. [id number]  Harriet Masson CES, ACSM

## 2011-07-07 NOTE — Progress Notes (Signed)
Reason for Consult:Hypoxia Referring Physician: Excell Seltzer  70 yowf adm 11/21 to [facility] Cards with DOE and exertional CP. 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 Noted to have persistent hypoxemia. V/Q revealed no significant perfusion defects. CXR revealed NAD. PCCM asked to evaluate for hypoxic resp failure. Pt has extensive smoking hx. No prior documented COPD. At her baseline, she has class II dyspnea.   Subj: Denies CP or dyspnea presently. Remains on 50% VM with SpO2 in low to mid 90s  Filed Vitals:   07/07/11 1231  BP:   Pulse:   Temp: 97.4 F (36.3 C)  Resp:    Filed Vitals:   07/07/11 0821 07/07/11 0835 07/07/11 1200 07/07/11 1231  BP: 130/48 130/48 131/32   Pulse:  91    Temp:  97.4 F (36.3 C)  97.4 F (36.3 C)  TempSrc:  Oral  Oral  Resp:  19 18   Height:      Weight:      SpO2:  83%  89%    Gen: Pleasant, well-nourished, in no distress,  normal affect. Rattling cough noted ENT: WNL Neck: No JVD Lungs: No use of accessory muscles, no overt wheezing, no rales, BS mildly diminished, scattered rhonchi Cardiovascular: RRR, heart sounds normal, no murmur or gallops, no peripheral edema Abdomen: soft and NT, no HSM,  BS normal Musculoskeletal: No deformities, no cyanosis or clubbing Neuro: alert, non focal Skin: Warm, no lesions or rashes  Dg Chest Port 1 View  07/07/2011  *RADIOLOGY REPORT*  Clinical Data: Hypoxemia.  PORTABLE CHEST - 1 VIEW  Comparison: Chest x-ray 07/05/2011.  Findings: The cardiac silhouette, mediastinal and hilar contours are within normal limits and stable.  Persistent left basilar airspace opacity and new right lower lobe atelectasis.  No definite pleural effusions or pneumothorax.  IMPRESSION:  1.  Persistent left basilar airspace opacity, likely infiltrate. 2.  Right lower lobe subsegmental atelectasis.  Original Report Authenticated By: P. Loralie Champagne, M.D.    LABS Results for orders placed during the  hospital encounter of 07/02/11 (from the past 24 hour(s))  BASIC METABOLIC PANEL     Status: Abnormal   Collection Time   07/07/11  4:30 AM      Component Value Range   Sodium 131 (*) 135 - 145 (mEq/L)   Potassium 3.5  3.5 - 5.1 (mEq/L)   Chloride 88 (*) 96 - 112 (mEq/L)   CO2 34 (*) 19 - 32 (mEq/L)   Glucose, Bld 141 (*) 70 - 99 (mg/dL)   BUN 41 (*) 6 - 23 (mg/dL)   Creatinine, Ser 4.09  0.50 - 1.10 (mg/dL)   Calcium 9.3  8.4 - 81.1 (mg/dL)   GFR calc non Af Amer 72 (*) >90 (mL/min)   GFR calc Af Amer 84 (*) >90 (mL/min)    Assessment and Plan: 1. Persistent hypoxia - Likely some component of COPD. Will treat as mild acute exacerbation with continued BDs, add doxycycline and Prednisone. Wean O2 as tolerated. Might require home O2 2. CAD - mgmt per cards. OK to use cardioselective beta blocker. Would avoid Carvedilol which is nonselective 3. Smoker - counseled re: need for cessation  Rondrick Barreira 07/07/2011, 12:35 PM

## 2011-07-08 ENCOUNTER — Inpatient Hospital Stay (HOSPITAL_COMMUNITY): Payer: Medicare Other

## 2011-07-08 LAB — BASIC METABOLIC PANEL
CO2: 33 mEq/L — ABNORMAL HIGH (ref 19–32)
Chloride: 89 mEq/L — ABNORMAL LOW (ref 96–112)
Glucose, Bld: 90 mg/dL (ref 70–99)
Potassium: 3.9 mEq/L (ref 3.5–5.1)
Sodium: 131 mEq/L — ABNORMAL LOW (ref 135–145)

## 2011-07-08 LAB — DIFFERENTIAL
Eosinophils Absolute: 0 10*3/uL (ref 0.0–0.7)
Eosinophils Relative: 0 % (ref 0–5)
Lymphs Abs: 1.3 10*3/uL (ref 0.7–4.0)
Monocytes Relative: 8 % (ref 3–12)

## 2011-07-08 LAB — CBC
HCT: 35.8 % — ABNORMAL LOW (ref 36.0–46.0)
Hemoglobin: 12.2 g/dL (ref 12.0–15.0)
MCH: 31.2 pg (ref 26.0–34.0)
MCV: 91.6 fL (ref 78.0–100.0)
RBC: 3.91 MIL/uL (ref 3.87–5.11)

## 2011-07-08 MED ORDER — FUROSEMIDE 10 MG/ML IJ SOLN
40.0000 mg | Freq: Once | INTRAMUSCULAR | Status: DC
  Filled 2011-07-08: qty 4

## 2011-07-08 MED ORDER — FUROSEMIDE 40 MG PO TABS
40.0000 mg | ORAL_TABLET | Freq: Every day | ORAL | Status: DC
  Administered 2011-07-08 – 2011-07-09 (×2): 40 mg via ORAL
  Filled 2011-07-08 (×2): qty 1

## 2011-07-08 NOTE — Progress Notes (Signed)
Pt admitted with resting cp and  sob. Pt lives with family. RN will have to check sats again in order for pt to qualify for home 02. CM will speak to pt to see which agency she will like to use for DME 02. CM spoke to pt and she lives with grandson. He works at night, however she has other family that are willing to stay with her while he is working. Pt has dme rw at home. Pt stated no other dme needed at this time. CM spoke to pt about RN to monitor disease management and she was willing to allow RN to visit. CM will give pt and grandson list of agencies. Pt had just received breathing tx and she has 02 @ 4l. CM spoke to RN to see if pt qualifies for home 02 and she stated that this am she had pt off 02 while eating and sats dropped to 86-88 % and had to replace 02 on her at 2l and she recovered.  MD please write order for [facility] RN for disease management/ home 02 and liter flow needed and fill out face to face. Thanks. CM spoke to pt an she decided  to use [facility] for services. CM did call [facility]  for dme and  he will f/u until d/c due to no orders written as of yet for home 02. Placed referral with [facility] for RN also. Will continue to monitor. Gala Lewandowsky, RN,BSN, [place] [phone].

## 2011-07-08 NOTE — Progress Notes (Signed)
   SUBJECTIVE:  Denies any dyspnea or chest pain.  However, respiratory notes and desaturations noted.  Phyiscialy weak.   PHYSICAL EXAM Filed Vitals:   07/07/11 2315 07/08/11 0400 07/08/11 0405 07/08/11 0500  BP: 115/35  103/39   Pulse:      Temp:  97.2 F (36.2 C)    TempSrc:  Oral    Resp:  16    Height:      Weight:    53.4 kg (117 lb 11.6 oz)  SpO2: 97%  95%    General:  Frail Lungs  Decreased breath sounds Heart:  RRR, distant Abdomen:  Positive bowel sounds, no rebound no guarding Extremities:  No edema  LABS:   Pending 07/08/2011  7:03 AM  No results found for this or any previous visit (from the past 24 hour(s)).  Intake/Output Summary (Last 24 hours) at 07/08/11 0653 Last data filed at 07/07/11 2200  Gross per 24 hour  Intake    720 ml  Output    901 ml  Net   -181 ml    ASSESSMENT AND PLAN:  Principal Problem:  *Hypoxemia:  Dr. Craige Cotta evaluated yesterday.  Oxygenation is currently the main issue.  On doxycycline.  Plans per pulmonary. Home health consulted for home O2 Active Problems:  Unstable angina: PCI this admission.  Continue with current medications.  Transfer to telemetry. Continue PT.    Leukocytosis    Rollene Rotunda 07/08/2011 6:53 AM

## 2011-07-08 NOTE — Progress Notes (Signed)
Transferred to 4731 by wheelchair , stable, report given to RN, belongings with pt.

## 2011-07-08 NOTE — Progress Notes (Signed)
BREAKFAST SERVED , PLACED ON ROOM AIR BUT DESAT. TO 86 -88 %, PLACED ON O2 2L [place],  O2 SAT WENT UP - 94-96 % CONTINUE TO MONITOR.

## 2011-07-08 NOTE — Progress Notes (Signed)
Pt admitted to the unit. Pt oriented to the unit. Placed on tele. Weight and VS taken. VSS. Pt on 4L O2. Pt does not appear in any immediate distress. Will continue to monitor.

## 2011-07-08 NOTE — Progress Notes (Signed)
CARDIAC REHAB PHASE I   PRE:  Rate/Rhythm: 80 SR    BP: sitting 137/51    SaO2: 96 4L  MODE:  Ambulation: 350 ft   POST:  Rate/Rhythm: 103    BP: sitting 137/45     SaO2: 89 6L  Pt SaO2 slightly better today. Would not register during walk. Used 6L. Unsteady at times, trouble steering RW. Assist x2 to help with steering and stability.  Pt continues groggy. Some SOB. Overall tol fair. To recliner, returned to 4L.  [id number]  Harriet Masson CES, ACSM

## 2011-07-08 NOTE — Progress Notes (Signed)
Reason for Consult:Hypoxia Referring Physician: Excell Seltzer  70 yowf adm 11/21 to [facility] Cards with DOE and exertional CP. 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 Noted to have persistent hypoxemia. V/Q revealed no significant perfusion defects. CXR revealed NAD. PCCM asked to evaluate for hypoxic resp failure. Pt has extensive smoking hx. No prior documented COPD. At her baseline, she has class II dyspnea.   Subj: Denies CP or dyspnea presently. Remains on 50% VM with SpO2 in low to mid 90s  Filed Vitals:   07/08/11 1300  BP: 128/45  Pulse: 84  Temp: 97.7 F (36.5 C)  Resp: 18    Intake/Output Summary (Last 24 hours) at 07/08/11 1425 Last data filed at 07/08/11 1300  Gross per 24 hour  Intake    350 ml  Output    900 ml  Net   -550 ml     Gen: Pleasant, well-nourished, in no distress,  normal affect. Rattling cough noted ENT: WNL Neck: No JVD Lungs: No use of accessory muscles, no overt wheezing, no rales, BS mildly diminished, scattered rhonchi Cardiovascular: RRR, heart sounds normal, no murmur or gallops, no peripheral edema Abdomen: soft and NT, no HSM,  BS normal Musculoskeletal: No deformities, no cyanosis or clubbing Neuro: alert, non focal Skin: Warm, no lesions or rashes  Dg Chest Port 1 View  07/07/2011  *RADIOLOGY REPORT*  Clinical Data: Hypoxemia.  PORTABLE CHEST - 1 VIEW  Comparison: Chest x-ray 07/05/2011.  Findings: The cardiac silhouette, mediastinal and hilar contours are within normal limits and stable.  Persistent left basilar airspace opacity and new right lower lobe atelectasis.  No definite pleural effusions or pneumothorax.  IMPRESSION:  1.  Persistent left basilar airspace opacity, likely infiltrate. 2.  Right lower lobe subsegmental atelectasis.  Original Report Authenticated By: P. Loralie Champagne, M.D.    LABS Results for orders placed during the hospital encounter of 07/02/11 (from the past 24 hour(s))  BASIC  METABOLIC PANEL     Status: Abnormal   Collection Time   07/08/11  5:45 AM      Component Value Range   Sodium 131 (*) 135 - 145 (mEq/L)   Potassium 3.9  3.5 - 5.1 (mEq/L)   Chloride 89 (*) 96 - 112 (mEq/L)   CO2 33 (*) 19 - 32 (mEq/L)   Glucose, Bld 90  70 - 99 (mg/dL)   BUN 48 (*) 6 - 23 (mg/dL)   Creatinine, Ser 1.61  0.50 - 1.10 (mg/dL)   Calcium 9.9  8.4 - 09.6 (mg/dL)   GFR calc non Af Amer 83 (*) >90 (mL/min)   GFR calc Af Amer >90  >90 (mL/min)  CBC     Status: Abnormal   Collection Time   07/08/11  5:45 AM      Component Value Range   WBC 17.8 (*) 4.0 - 10.5 (K/uL)   RBC 3.91  3.87 - 5.11 (MIL/uL)   Hemoglobin 12.2  12.0 - 15.0 (g/dL)   HCT 04.5 (*) 40.9 - 46.0 (%)   MCV 91.6  78.0 - 100.0 (fL)   MCH 31.2  26.0 - 34.0 (pg)   MCHC 34.1  30.0 - 36.0 (g/dL)   RDW 81.1  91.4 - 78.2 (%)   Platelets 330  150 - 400 (K/uL)  DIFFERENTIAL     Status: Abnormal   Collection Time   07/08/11  5:45 AM      Component Value Range   Neutrophils Relative 85 (*)  43 - 77 (%)   Neutro Abs 15.1 (*) 1.7 - 7.7 (K/uL)   Lymphocytes Relative 7 (*) 12 - 46 (%)   Lymphs Abs 1.3  0.7 - 4.0 (K/uL)   Monocytes Relative 8  3 - 12 (%)   Monocytes Absolute 1.3 (*) 0.1 - 1.0 (K/uL)   Eosinophils Relative 0  0 - 5 (%)   Eosinophils Absolute 0.0  0.0 - 0.7 (K/uL)   Basophils Relative 0  0 - 1 (%)   Basophils Absolute 0.0  0.0 - 0.1 (K/uL)    Assessment and Plan: 1. Persistent hypoxia - Likely some component of COPD. Will treat as mild acute exacerbation with continued BDs, add doxycycline and Prednisone. Wean O2 as tolerated. Continue gentle diureses.  As patient improves from a respiratory standpoint will need an ambulatory desat study in order to determine home O2 needs. 2. Leukocytosis:  Likely related to steroids. 2. CAD - mgmt per cards. OK to use cardioselective beta blocker. Would avoid Carvedilol which is nonselective 3. Smoker - counseled re: need for cessation  Koren Bound, M.D.

## 2011-07-09 LAB — BASIC METABOLIC PANEL
BUN: 33 mg/dL — ABNORMAL HIGH (ref 6–23)
CO2: 34 mEq/L — ABNORMAL HIGH (ref 19–32)
Calcium: 10.5 mg/dL (ref 8.4–10.5)
GFR calc non Af Amer: 86 mL/min — ABNORMAL LOW (ref >90)
Glucose, Bld: 82 mg/dL (ref 70–99)

## 2011-07-09 MED ORDER — ASPIRIN 81 MG PO TABS: 81.0000 mg | ORAL_TABLET | Freq: Every day | ORAL | Status: DC

## 2011-07-09 MED ORDER — ALBUTEROL SULFATE (5 MG/ML) 0.5% IN NEBU: 2.5000 mg | INHALATION_SOLUTION | RESPIRATORY_TRACT | Status: DC | PRN

## 2011-07-09 MED ORDER — TIOTROPIUM BROMIDE MONOHYDRATE 18 MCG IN CAPS: 18.0000 ug | ORAL_CAPSULE | Freq: Every day | RESPIRATORY_TRACT | Status: DC

## 2011-07-09 MED ORDER — DOXYCYCLINE HYCLATE 100 MG PO TABS: 100.0000 mg | ORAL_TABLET | Freq: Two times a day (BID) | ORAL | Status: AC

## 2011-07-09 MED ORDER — ALBUTEROL 90 MCG/ACT IN AERS: 2.0000 | INHALATION_SPRAY | RESPIRATORY_TRACT | Status: DC | PRN

## 2011-07-09 MED ORDER — DIPHENHYDRAMINE HCL (SLEEP) 25 MG PO TABS: 25.0000 mg | ORAL_TABLET | Freq: Every evening | ORAL | Status: DC | PRN

## 2011-07-09 MED ORDER — FLUTICASONE-SALMETEROL 250-50 MCG/DOSE IN AEPB: 1.0000 | INHALATION_SPRAY | Freq: Two times a day (BID) | RESPIRATORY_TRACT | Status: DC

## 2011-07-09 MED ORDER — CLONAZEPAM 0.5 MG PO TABS: 0.5000 mg | ORAL_TABLET | Freq: Two times a day (BID) | ORAL | Status: DC | PRN

## 2011-07-09 MED ORDER — OLMESARTAN MEDOXOMIL 20 MG PO TABS: 10.0000 mg | ORAL_TABLET | Freq: Every day | ORAL | Status: DC

## 2011-07-09 MED ORDER — PREDNISONE 10 MG PO TABS: ORAL_TABLET | ORAL | Status: DC

## 2011-07-09 MED ORDER — FLUTICASONE-SALMETEROL 250-50 MCG/DOSE IN AEPB
1.0000 | INHALATION_SPRAY | Freq: Two times a day (BID) | RESPIRATORY_TRACT | Status: DC
  Filled 2011-07-09: qty 14

## 2011-07-09 MED ORDER — TIOTROPIUM BROMIDE MONOHYDRATE 18 MCG IN CAPS
18.0000 ug | ORAL_CAPSULE | Freq: Every day | RESPIRATORY_TRACT | Status: DC
  Filled 2011-07-09: qty 5

## 2011-07-09 NOTE — Progress Notes (Signed)
Patient stable upon discharge. Patient and family member given discharge instructions and prescriptions. Patient sent home on 4L oxygen via nasal cannula. Patient given two oxygen canisters to take home. Patient left hospital via wheelchair with niece and nurse tech.

## 2011-07-09 NOTE — Progress Notes (Addendum)
Reason for Consult:Hypoxia Referring Physician: Excell Seltzer  70 yowf adm 11/21 to [facility] Cards with DOE and exertional CP. 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 Noted to have persistent hypoxemia. V/Q revealed no significant perfusion defects. CXR revealed NAD. PCCM asked to evaluate for hypoxic resp failure. Pt has extensive smoking hx. No prior documented COPD. At her baseline, she has class II dyspnea.   Subj: Denies CP or dyspnea presently. Remains on 50% VM with SpO2 in low to mid 90s  Filed Vitals:   07/09/11 0500  BP: 131/71  Pulse: 68  Temp: 97.2 F (36.2 C)  Resp: 16    Intake/Output Summary (Last 24 hours) at 07/09/11 1610 Last data filed at 07/09/11 0531  Gross per 24 hour  Intake    270 ml  Output    551 ml  Net   -281 ml     Gen: Pleasant, well-nourished, in no distress,  normal affect. Rattling cough noted ENT: WNL Neck: No JVD Lungs: No use of accessory muscles, mild  wheezing, no rales, BS mildly diminished, scattered rhonchi Cardiovascular: RRR, heart sounds normal, no murmur or gallops, no peripheral edema Abdomen: soft and NT, no HSM,  BS normal Musculoskeletal: No deformities, no cyanosis or clubbing Neuro: alert, non focal Skin: Warm, no lesions or rashes  Dg Chest Port 1 View  07/08/2011  *RADIOLOGY REPORT*  Clinical Data: Follow-up, respiratory distress, shortness of breath, cough, restroom failure  PORTABLE CHEST - 1 VIEW  Comparison: Portable exam 1445 hourscompared to 07/07/2011  Findings: Upper normal heart size. Atherosclerotic calcifications aorta. Mediastinal contours and pulmonary vascularity normal. Decreased left lower lobe infiltrate. Mild persistent right basilar atelectasis. Underlying emphysematous changes. Probable skin fold projects over left lung. Remaining lungs clear. No pleural effusion or pneumothorax.  IMPRESSION: Minimally improved left lower lobe infiltrate with persistent atelectasis right base.   Original Report Authenticated By: Lollie Marrow, M.D.    LABS Results for orders placed during the hospital encounter of 07/02/11 (from the past 24 hour(s))  BASIC METABOLIC PANEL     Status: Abnormal   Collection Time   07/09/11  6:05 AM      Component Value Range   Sodium 137  135 - 145 (mEq/L)   Potassium 4.6  3.5 - 5.1 (mEq/L)   Chloride 94 (*) 96 - 112 (mEq/L)   CO2 34 (*) 19 - 32 (mEq/L)   Glucose, Bld 82  70 - 99 (mg/dL)   BUN 33 (*) 6 - 23 (mg/dL)   Creatinine, Ser 9.60  0.50 - 1.10 (mg/dL)   Calcium 45.4  8.4 - 10.5 (mg/dL)   GFR calc non Af Amer 86 (*) >90 (mL/min)   GFR calc Af Amer >90  >90 (mL/min)    Assessment and Plan: 1. Persistent hypoxia - Likely some component of COPD. Will treat as mild acute exacerbation with continued BDs, add doxycycline and Prednisone. Wean O2 as tolerated. Continue gentle diureses.  As patient improves from a respiratory standpoint will need an ambulatory desat study in order to determine home O2 needs. Ordered 11/28. 2. Leukocytosis:  Likely related to steroids. 2. CAD - mgmt per cards. OK to use cardioselective beta blocker. Would avoid Carvedilol which is nonselective 3. Smoker - counseled re: need for cessation  Candie Chroman, M.D.  She is in no distress. Still has rattling cough but is able to effectively mobilize secretions. I will transition her to inhalers today. From my standpoint she may be  discharged to home @ any time. She will need the following after discharge 1) Advair diskus 250/50 - one inhalation q 12 hours 2) Spiriva inhaler - one inhalation daily 3) Albuterol MDI - 2 actuations q 4 hrs PRN 4) Home O2 @ 4lpm [place] continuous 5) She should followup with any pulmonary physician @ [facility] in 1-2 weeks after discharge. If unable to get scheduled with an MD in that time, she can see our NP Tammy Parrett for the first f/u visit. Call [phone] to arrange  Pt seen and examined and database reviewed. I agree with above findings,  assessment and plan  Billy Fischer, MD;  PCCM service; [place] [phone]   Almost forgot... She should continue on prednisone @ 30 mg daily for 3 more days, then 20 mg daily for three days, then stop and complete a total of 7 days of doxycycline  Billy Fischer, MD;  PCCM service; [place] [phone]

## 2011-07-09 NOTE — Discharge Summary (Signed)
Discharge Summary   Patient ID: Patricia [name] MRN: [id number], DOB/AGE: [birth year] 70 y.o. Admit date: 07/02/2011 D/C date:     07/09/2011   Primary Discharge Diagnoses:  1. CP/USA - s/p PTCA to LCx/OM bifurcation 07/02/11 2. CAD s/p DES to LCX 2011 - PCI this admission as above - normal LV function by echo this admission 3. Acute COPD exacerbation with hypoxemia - likely related to long-standing tobacco use - treated with nebs, steroids, antibiotics - qualified for 4L/min HHO2 continuous 4. Hyponatremia, resolved 5. Leukocytosis, initially likely related to acute illness, later felt due to steroids 6. Anxiety  Secondary Discharge Diagnoses:  1. Carotid artery disease, moderate 2. Osteopenia 3. COPD 4. HL 5. Thyroid nodule 6. Abnormal LFT's 7. HTN, intolerant to ACEI 2/2 cough 8. Depression 9. Allergic rhinitis 10. Panic attacks  Hospital Course: 70 y/o F with history of CAD presented to [facility] with complaints of chest pain, ongoing for the last month, occurring more frequently than usual and worsening in intensity. She described it as aching. It was non-radiating. She denied any fever, fatigue, syncope or cough. The patient has a history of coronary artery disease and underwent stenting of the native left circumflex in 2011 with a drug-eluting stent platform. 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 Dr. Excell Seltzer last saw her in the office in August and she was not having significant angina at that time.  EKG demonstrated ST/T wave changes concerning for MI. Dr. Excell Seltzer recommended cardiac cath, which subsequently demonstrated severe ISR of the prox LCx involving the first OM, to which Dr. Excell Seltzer performed successful complex percutaneous intervention of left circumflex/obtuse marginal bifurcation with intravascular ultrasound guidance. 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 She was loaded with Plavix in the lab. Post-cath, she was noted to be wheezy and hypoxic with frequent O2 drops. Nebs were started Dr. Excell Seltzer obtained a 2D echo which demonstrated EF 60-65%, mild LVH, otherwise unremarkable. VQ scan showed low probability for PE. Dr. Excell Seltzer felt maybe it was related to a component of chronic lung disease with heavy tobacco history. BB was held 2/2 pulmonary status. She did have a mildly elevated BNP and was thus treated with IV Lasix for several days. She was also felt to have underlying anxiety and anxiolytics were prescribed.  Pulmonary was asked to consult for persistent hypoxia/dyspnea. They felt she had probable atelectasis and recommended pulmonary toilet, bronchodilators, and to follow CXR. Ultimately it was felt to be a mild COPD exacerbation. Doxycycline and prednisone were added 07/06/11, with subsequent increase in leukocytosis felt related to steroids. On day of discharge, she is still having some desats and ultimately will require [facility] which is being arranged by CM. Pulmonary has recommended Advair, Spiriva, Albuterol, Prednisone, and Doxycycline (x7 day course) at discharge with follow-up in their office. The nurse was able to document these desats: "Patient's O2 sats on room air while resting was 87%. O2 sats on room air while standing was 83%. O2 sats on room air while walking was 83%. Patient placed on 4 liters O2 nasal canula, and O2 sats were 90% while walking." From a cardiac standpoint, the patient remained stable. She will go home on ASA, Plavix, Statin, and Benicar as well as low dose anxiolytic per MD recommendations. Dr. Excell Seltzer has seen and examined her and feels she  is stable for discharge.  Discharge Vitals: Blood pressure 129/65, pulse 78, temperature 97.7 F (36.5 C), temperature source Oral, resp. rate 22, height 5\' 2"  (1.575 m), weight 117 lb 14.4 oz (53.479  kg), SpO2 95.00%.  Labs: Lab Results  Component Value Date   WBC 17.8* 07/08/2011   HGB 12.2 07/08/2011   HCT 35.8* 07/08/2011   MCV 91.6 07/08/2011   PLT 330 07/08/2011    Lab 07/09/11 0605 07/06/11 0630  NA 137 --  K 4.6 --  CL 94* --  CO2 34* --  BUN 33* --  CREATININE 0.68 --  CALCIUM 10.5 --  PROT -- 6.1  BILITOT -- 0.4  ALKPHOS -- 119*  ALT -- 16  AST -- 17  GLUCOSE 82 --    Lab Results  Component Value Date   CHOL 141 07/03/2011   HDL 71 07/03/2011   LDLCALC 59 07/03/2011   TRIG 55 07/03/2011    Diagnostic Studies/Procedures:  1. Nm Pulmonary Per & Vent - 07/03/2011 IMPRESSION: Low probability for pulmonary embolus.   2. Chest Port 1 View - 07/08/2011    IMPRESSION: Minimally improved left lower lobe infiltrate with persistent atelectasis right base.   3. Cardiac catheterization this admission, please see full report and above for summary. 4. 2D Echo - 07/04/11 - Left ventricle: The cavity size was normal. Wall thickness was increased in a pattern of mild LVH. Systolic function was normal. The estimated ejection fraction was in the range of 60% to 65%. Wall motion was normal; there were no regional wall motion abnormalities.   Discharge Medications   Current Discharge Medication List    START taking these medications   Details  albuterol (PROVENTIL,VENTOLIN) 90 MCG/ACT inhaler Inhale 2 puffs into the lungs every 4 (four) hours as needed for wheezing. Follow up with your pulmonary doctor for this medicine. Qty: 17 g, Refills: 1    clonazePAM (KLONOPIN) 0.5 MG tablet Take 1 tablet (0.5 mg total) by mouth 2 (two) times daily as needed for anxiety (Hold if you are sedated.). Qty: 30 tablet, Refills: 0    doxycycline (VIBRA-TABS) 100 MG tablet Take 1 tablet (100 mg total) by mouth every 12 (twelve) hours. Qty: 7 tablet, Refills: 0    Fluticasone-Salmeterol (ADVAIR) 250-50 MCG/DOSE AEPB Inhale 1 puff into the lungs 2 (two) times daily. Follow up with your  pulmonary doctor for this medicine. Qty: 60 each, Refills: 1    olmesartan (BENICAR) 20 MG tablet Take 0.5 tablets (10 mg total) by mouth daily. Qty: 30 tablet, Refills: 6    predniSONE (DELTASONE) 10 MG tablet Take 3 tablets for 3 days, then decrease to 2 tablets for 3 days, then stop Qty: 15 tablet, Refills: 0    tiotropium (SPIRIVA) 18 MCG inhalation capsule Place 1 capsule (18 mcg total) into inhaler and inhale daily. Follow up with your pulmonary doctor for this medicine. Qty: 30 capsule, Refills: 1      CONTINUE these medications which have CHANGED   Details  aspirin 81 MG tablet Take 1 tablet (81 mg total) by mouth daily.    diphenhydrAMINE (SIMPLY SLEEP) 25 MG tablet Take 1 tablet (25 mg total) by mouth at bedtime as needed. For sleep. Do not take this and clonazepam together. Qty: 30 tablet      CONTINUE these medications which have NOT CHANGED   Details  clopidogrel (PLAVIX) 75 MG tablet Take 1 tablet (75 mg total) by mouth daily. Qty: 90 tablet, Refills: 3   Associated Diagnoses:  Coronary atherosclerosis of unspecified type of vessel, native or graft    loratadine (CLARITIN) 10 MG tablet Take 10 mg by mouth daily as needed. For allergies    nitroGLYCERIN (NITROLINGUAL) 0.4 MG/SPRAY spray Place 1 spray under the tongue every 5 (five) minutes as needed.      NITROSTAT 0.4 MG SL tablet DISSOLVE 1 TABLET UNDER TONGUE EVERY 5 MIN AS NEEDED MAX 3 TIMES Qty: 25 tablet, Refills: 3    simvastatin (ZOCOR) 20 MG tablet Take 20 mg by mouth at bedtime.        STOP taking these medications     albuterol (PROVENTIL) (2.5 MG/3ML) 0.083% nebulizer solution      DIOVAN 80 MG tablet      hydrochlorothiazide (HYDRODIURIL) 25 MG tablet      ipratropium (ATROVENT) 0.02 % nebulizer solution      methylPREDNISolone sodium succinate (SOLU-MEDROL) 125 MG injection         Disposition   The patient will be discharged in stable condition to home. Discharge Orders    Future  Appointments: Provider: Department: Dept Phone: Center:   07/21/2011 3:30 PM Beatrice Lecher, PA [facility] [phone] [facility]   07/23/2011 10:30 AM Rubye Oaks, NP [facility] [phone] None   08/08/2011 2:30 PM Wanda Plump, MD [facility] [phone] [facility]   08/18/2011 3:00 PM Micheline Chapman, MD [facility] [phone] [facility]     Future Orders Please Complete By Expires   Diet - low sodium heart healthy      Care order/instruction      Comments:   Please make sure [facility] RN for disease management and resp monitoring is set up prior to discharge   Increase activity slowly      Comments:   No driving for 1 week. No lifting over 5 lbs for 1 week. No sexual activity for 1 week.     Discharge wound care:      Comments:   Keep procedure site clean & dry. If you notice increased pain, swelling, bleeding or pus, call/return!  You may shower, but no soaking baths/hot tubs/pools for 1 week.       Follow-up Information    Follow up with Rubye Oaks, NP. (Pulmonology Office - 07/23/11 at 10:30am)    Contact information:   [facility] - Pulmonology [address] [place] [address] [phone]       Follow up with Tereso Newcomer, PA. (At Dr. Earmon Phoenix office 07/21/11 at 3:30pm)    Contact information:   [address] [place] [address] [phone]            Duration of Discharge Encounter: Greater than 30 minutes including physician and PA time.  Signed, Ronie Spies PA-C 07/09/2011, 4:49 PM

## 2011-07-09 NOTE — Progress Notes (Signed)
Patient's O2 sats on room air while resting was 87%. O2 sats on room air while standing was 83%. O2 sats on room air while walking was 83%. Patient placed on 4 liters O2 nasal canula, and O2 sats were 90% while walking.

## 2011-07-09 NOTE — Progress Notes (Signed)
CARDIAC REHAB PHASE I   PRE:  Rate/Rhythm: 80 SR  BP:  Supine:    Sitting: 112/63  Standing:    SaO2: 93-94 4L  MODE:  Ambulation: 320 ft   POST:  Rate/Rhythem: 105  BP:  Supine:   Sitting: 112/63  Standing:    SaO2: 90-92 4L 1411-1435 Tolerated walk using RW and O2 4l. Gait steady with walker, able to steer walker better today.Pt has less dyspnea  today on walk, improved. To side of bed after walk with call light in reach.  Beatrix Fetters

## 2011-07-09 NOTE — Progress Notes (Signed)
Cosign for Gailen Shelter RN assessment, medication, IV, care plan/ education, and progress notes.

## 2011-07-09 NOTE — Progress Notes (Signed)
Subjective:  Feels better. Eager to go home. No chest pain. Breathing improved.  Objective:  Vital Signs in the last 24 hours: Temp:  [97.2 F (36.2 C)-97.6 F (36.4 C)] 97.2 F (36.2 C) (11/28 0500) Pulse Rate:  [68-89] 68  (11/28 0500) Resp:  [16-18] 16  (11/28 0500) BP: (103-131)/(35-71) 131/71 mmHg (11/28 0500) SpO2:  [84 %-96 %] 84 % (11/28 1010) Weight:  [53.479 kg (117 lb 14.4 oz)-53.6 kg (118 lb 2.7 oz)] 117 lb 14.4 oz (53.479 kg) (11/28 0500)  Intake/Output from previous day: 11/27 0701 - 11/28 0700 In: 470 [P.O.:470] Out: 1051 [Urine:1050; Stool:1]  Physical Exam: Pt is alert and oriented, NAD HEENT: normal Neck: JVP - normal, carotids 2+= without bruits Lungs: Diminished air movement bilaterally CV: RRR without murmur or gallop Abd: soft, NT, Positive BS, no hepatomegaly Ext: no C/C/E, distal pulses intact and equal Skin: warm/dry no rash   Lab Results:  Basename 07/08/11 0545  WBC 17.8*  HGB 12.2  PLT 330    Basename 07/09/11 0605 07/08/11 0545  NA 137 131*  K 4.6 3.9  CL 94* 89*  CO2 34* 33*  GLUCOSE 82 90  BUN 33* 48*  CREATININE 0.68 0.76   No results found for this basename: TROPONINI:2,CK,MB:2 in the last 72 hours  Tele: Sinus rhythm  Assessment/Plan:  1. Unstable angina - s/p PTCA of LCx/OM bifurcation. Has been clinically stable since procedure. Continue ASA, plavix, simvastatin, benicar. No beta-blocker secondary to severe lung disease. 2. COPD exacerbation - home O2, steroids, doxycycline, inhalers. Appreciate recs and management of pulm team. Pt to f/u with pulm MD in 1-2 weeks. 3. Anxiety - new Rx for clonazepam 0.5 mg bid. Hopefully can have refilled by her PCP. 4. Dispo - home when [facility] and home O2 arranged.   Tonny Bollman, M.D. 07/09/2011, 2:07 PM

## 2011-07-09 NOTE — Plan of Care (Signed)
Problem: Phase III Progression Outcomes Goal: Vascular site scale level 0 - I Vascular Site Scale Level 0: No bruising/bleeding/hematoma Level I (Mild): Bruising/Ecchymosis, minimal bleeding/ooozing, palpable hematoma < 3 cm Level II (Moderate): Bleeding not affecting hemodynamic parameters, pseudoaneurysm, palpable hematoma > 3 cm  Outcome: Completed/Met Date Met:  07/09/11 Level 0  Problem: Discharge Progression Outcomes Goal: Vascular site scale level 0 - I Vascular Site Scale Level 0: No bruising/bleeding/hematoma Level I (Mild): Bruising/Ecchymosis, minimal bleeding/ooozing, palpable hematoma < 3 cm Level II (Moderate): Bleeding not affecting hemodynamic parameters, pseudoaneurysm, palpable hematoma > 3 cm  Outcome: Completed/Met Date Met:  07/09/11 Level 0

## 2011-07-11 NOTE — Discharge Summary (Signed)
Agree as documented

## 2011-07-16 ENCOUNTER — Other Ambulatory Visit: Payer: Self-pay | Admitting: Internal Medicine

## 2011-07-18 ENCOUNTER — Other Ambulatory Visit: Payer: Self-pay | Admitting: Internal Medicine

## 2011-07-18 NOTE — Telephone Encounter (Signed)
Duplicate already done 07-16-11, pharmacy made aware

## 2011-07-21 ENCOUNTER — Encounter: Payer: Self-pay | Admitting: Physician Assistant

## 2011-07-21 ENCOUNTER — Ambulatory Visit (INDEPENDENT_AMBULATORY_CARE_PROVIDER_SITE_OTHER): Payer: Medicare Other | Admitting: Physician Assistant

## 2011-07-21 VITALS — BP 138/68 | HR 60 | Ht 62.0 in | Wt 122.8 lb

## 2011-07-21 DIAGNOSIS — J449 Chronic obstructive pulmonary disease, unspecified: Secondary | ICD-10-CM

## 2011-07-21 DIAGNOSIS — I739 Peripheral vascular disease, unspecified: Secondary | ICD-10-CM | POA: Insufficient documentation

## 2011-07-21 DIAGNOSIS — I2510 Atherosclerotic heart disease of native coronary artery without angina pectoris: Secondary | ICD-10-CM

## 2011-07-21 DIAGNOSIS — E785 Hyperlipidemia, unspecified: Secondary | ICD-10-CM

## 2011-07-21 NOTE — Assessment & Plan Note (Signed)
Doing well after complex PCI of her circumflex/obtuse marginal.  RCA disease was stable and treated medically.  She continues on aspirin and Plavix.  Followup with Dr. Excell Seltzer in one month.

## 2011-07-21 NOTE — Assessment & Plan Note (Signed)
Improved.  She will followup with pulmonary later this week.

## 2011-07-21 NOTE — Assessment & Plan Note (Signed)
I congratulated her on quitting. 

## 2011-07-21 NOTE — Progress Notes (Signed)
[address] [place], [place]  [address] Phone: [phone] Fax:  [phone]  Date:  07/21/2011   Name:  Patricia [name]       DOB:  1940-09-16 MRN:  [id number]  PCP:  Dr. Drue Novel Primary Cardiologist:  Dr. Tonny Bollman    History of Present Illness: Patricia [name] is a 70 y.o. female who presents for post hospital follow up.  She has a history of CAD, status post prior PCI in 12/11 with a DES to the circumflex.  Severe stenosis of the RCA was noted at that time but the distal vessel was supplied by collaterals and this was treated medically.  She was admitted 11/21-11/28.  She presented with chest pain.  She ruled out for myocardial infarction.  LHC 07/02/11: pLAD 30%, pCFX stent with 95% ISR involving origin of OM1 with 90-95% prox stenosis, oRCA 95% - vessel is sub totally occluded and fills from LCA collats; EF 55-65%.  PCI:  Complex PCI (POBA) of the CFX/OM bifurcation with IVUS guidance.  Of note, there was critical stenosis noted just below the sheath site leading into a totally occluded SFA and a patent deep femoral artery.  Post PCI, she developed dyspnea and hypoxia.  VQ scan was low probability for pulmonary embolus.  Her BNP was elevated and she was treated with Lasix.  Her beta blocker was discontinued.  Pulmonary saw the patient and thought that she had atelectasis and recommended pulmonary toilet.  Ultimately they felt she had a mild COPD exacerbation and placed her on antibiotics as well as prednisone and inhaled drugs.  She continued to be hypoxic and was sent home on home oxygen therapy with plans for followup with pulmonary as an outpatient.  Echocardiogram 07/04/11: Mild LVH, EF 60-65%.  Labs: Hemoglobin 12.2, potassium 4.6, creatinine 0.68, ALT 16, TC 141, HDL 71, LDL 59, TG 55.  She is doing well.  She denies chest pain.  She has mild dyspnea with exertion.  She can do most activities at home without difficulty.  Over the last several days, she has not  worn her oxygen.  Her oxygen levels have been optimal by the home health nurse.  She denies Orthopnea, PND or edema.  She denies syncope or palpitations.  Past Medical History  Diagnosis Date  . Carotid artery disease     moderate; asymptomatic  . CAD (coronary artery disease)     s/p DES to the circumflex 12/11;  LHC 07/02/11: pLAD 30%, pCFX stent with 95% ISR involving origin of OM1 with 90-95% prox stenosis, oRCA 95% - vessel is sub totally occluded and fills from LCA collats; EF 55-65%.  PCI:  Complex PCI (POBA) of the CFX/OM bifurcation with IVUS guidance;  Echocardiogram 07/04/11: Mild LVH, EF 60-65%  . Osteopenia   . COPD (chronic obstructive pulmonary disease)   . Hyperlipidemia   . Thyroid nodule   . Abnormal LFTs   . Depression   . HTN (hypertension)     intolerant to ACEi (cough)  . Allergic rhinitis   . Anxiety   . Panic attacks   . PAD (peripheral artery disease)     At [facility] 11/12:  critical stenosis noted just below the sheath site leading into a totally occluded SFA and a patent deep femoral artery    Current Outpatient Prescriptions  Medication Sig Dispense Refill  . albuterol (PROVENTIL,VENTOLIN) 90 MCG/ACT inhaler Inhale 2 puffs into the lungs every 4 (four) hours as needed for wheezing. Follow up with your  pulmonary doctor for this medicine.  17 g  1  . aspirin 81 MG tablet Take 1 tablet (81 mg total) by mouth daily.      . clonazePAM (KLONOPIN) 0.5 MG tablet Take 1 tablet (0.5 mg total) by mouth 2 (two) times daily as needed for anxiety (Hold if you are sedated.).  30 tablet  0  . clopidogrel (PLAVIX) 75 MG tablet Take 1 tablet (75 mg total) by mouth daily.  90 tablet  3  . diphenhydrAMINE (SIMPLY SLEEP) 25 MG tablet Take 1 tablet (25 mg total) by mouth at bedtime as needed. For sleep. Do not take this and clonazepam together.  30 tablet    . Fluticasone-Salmeterol (ADVAIR) 250-50 MCG/DOSE AEPB Inhale 1 puff into the lungs 2 (two) times daily. Follow up with your  pulmonary doctor for this medicine.  60 each  1  . loratadine (CLARITIN) 10 MG tablet Take 10 mg by mouth daily as needed. For allergies      . nitroGLYCERIN (NITROLINGUAL) 0.4 MG/SPRAY spray Place 1 spray under the tongue every 5 (five) minutes as needed.        Marland Kitchen NITROSTAT 0.4 MG SL tablet DISSOLVE 1 TABLET UNDER TONGUE EVERY 5 MIN AS NEEDED MAX 3 TIMES  25 tablet  3  . olmesartan (BENICAR) 20 MG tablet Take 0.5 tablets (10 mg total) by mouth daily.  30 tablet  6  . predniSONE (DELTASONE) 10 MG tablet Take 3 tablets for 3 days, then decrease to 2 tablets for 3 days, then stop  15 tablet  0  . simvastatin (ZOCOR) 20 MG tablet TAKE 1 TABLET BY MOUTH AT BEDTIME  90 tablet  2  . tiotropium (SPIRIVA) 18 MCG inhalation capsule Place 1 capsule (18 mcg total) into inhaler and inhale daily. Follow up with your pulmonary doctor for this medicine.  30 capsule  1    Allergies: No Known Allergies  History  Substance Use Topics  . Smoking status: Former Smoker -- 0.5 packs/day for 58 years    Quit date: 07/02/2011  . Smokeless tobacco: Not on file   Comment: smoking less since heart stent 07-2010  . Alcohol Use: Yes     socially     ROS:  Please see the history of present illness.   She denies leg pain with exertion.  All other systems reviewed and negative.   PHYSICAL EXAM: VS:  BP 138/68  Pulse 60  Ht 5\' 2"  (1.575 m)  Wt 122 lb 12.8 oz (55.702 kg)  BMI 22.46 kg/m2 Well nourished, well developed, in no acute distress HEENT: normal Neck: no JVD Cardiac:  normal S1, S2; RRR; no murmur Lungs:  Decreased breath sounds bilaterally, no wheezing, rhonchi or rales Abd: soft, nontender, no hepatomegaly Ext: no edema; Right femoral arteriotomy site without hematoma or bruit Skin: warm and dry Neuro:  CNs 2-12 intact, no focal abnormalities noted  EKG:   Sinus rhythm, heart rate 60, normal axis, no ischemic changes  ASSESSMENT AND PLAN:

## 2011-07-21 NOTE — Assessment & Plan Note (Signed)
Managed by primary care.  Goal LDL less than 70. 

## 2011-07-21 NOTE — Assessment & Plan Note (Signed)
Controlled.  

## 2011-07-21 NOTE — Assessment & Plan Note (Addendum)
This was noted beyond her sheath in her right leg.  I discussed her case today with Dr. Excell Seltzer.  He would like to have her undergo ABIs with arterial Dopplers.  I will have this arranged and the results can be reviewed at her followup in one month.

## 2011-07-21 NOTE — Patient Instructions (Addendum)
Your physician recommends that you schedule a follow-up appointment in: 08/18/11 WITH DR. Excell Seltzer  Your physician has requested that you have a lower extremity arterial duplex WITH ABI'S PER DR. COOPER DX 443.9 PAD. This test is an ultrasound of the arteries in the legs or arms. It looks at arterial blood flow in the legs and arms. Allow one hour for Lower and Upper Arterial scans. There are no restrictions or special instructions

## 2011-07-23 ENCOUNTER — Inpatient Hospital Stay: Payer: Medicare Other | Admitting: Adult Health

## 2011-07-23 ENCOUNTER — Telehealth: Payer: Self-pay | Admitting: Cardiovascular Disease

## 2011-07-23 NOTE — Telephone Encounter (Signed)
Patient was called,she is planning on keeping her appointment with Dr. Excell Seltzer 08/18/11 at 3:00 pm.

## 2011-07-23 NOTE — Telephone Encounter (Signed)
FYI, Pt has declined any more visits, doing fine

## 2011-07-24 ENCOUNTER — Encounter: Payer: Self-pay | Admitting: Adult Health

## 2011-07-24 ENCOUNTER — Ambulatory Visit (INDEPENDENT_AMBULATORY_CARE_PROVIDER_SITE_OTHER)
Admission: RE | Admit: 2011-07-24 | Discharge: 2011-07-24 | Disposition: A | Discharge status: Home / Self Care | Payer: Medicare Other | Source: Ambulatory Visit | Attending: Adult Health | Admitting: Adult Health

## 2011-07-24 ENCOUNTER — Ambulatory Visit (INDEPENDENT_AMBULATORY_CARE_PROVIDER_SITE_OTHER): Payer: Medicare Other | Admitting: Adult Health

## 2011-07-24 VITALS — BP 154/80 | HR 65 | Temp 97.5°F | Ht 62.0 in | Wt 124.2 lb

## 2011-07-24 DIAGNOSIS — Z23 Encounter for immunization: Secondary | ICD-10-CM

## 2011-07-24 DIAGNOSIS — J449 Chronic obstructive pulmonary disease, unspecified: Secondary | ICD-10-CM

## 2011-07-24 NOTE — Patient Instructions (Signed)
Continue on Advair 1 puff Twice daily  WE ARE SO PROUD OF YOU FOR QUITTING SMOKING.  Follow up in 3-4 weeks with Dr. Vassie Loll or Dr. Craige Cotta with PFT and As needed   May wear Oxygen As needed  -will decide on return if we can stop .  We have set up for an overnight oximetry to evaluate your oxygen at night.

## 2011-07-24 NOTE — Progress Notes (Signed)
  Subjective:    Patient ID: Patricia [name], female    DOB: 02/10/1941, 70 y.o.   MRN: [id number]  HPI 61 yowf smoker admitted 07/02/11 with chest pain, L Heart cath revealed in stent restenosis - PCI performed with improvement in CP. Noted to have persistent hypoxemia. V/Q revealed no significant perfusion defects. CXR revealed NAD. PCCM initial consult to evaluate for hypoxic resp failure.   07/24/11 Post Hospital  Pt was admitted 07/02/11 for chest pain found to have a stent restenosis s/p PCI . She had persistent hypoxemia  Felt secondary to COPD exacerbation. She had no formal dx of COPD prior to admission. . VQ showed no sign. Perfusion defects. She was tx with aggressive pulmonary regimen. Started on Advair and Spiriva. Discharged on home O2 due to exertional hypoxia.  Since discharge she is feeling better. Has stopped smoking. No fever or discolored mucus. Decreased dyspnea. No chest pain.     Review of Systems Constitutional:   No  weight loss, night sweats,  Fevers, chills, fatigue, or  lassitude.  HEENT:   No headaches,  Difficulty swallowing,  Tooth/dental problems, or  Sore throat,                No sneezing, itching, ear ache, nasal congestion, post nasal drip,   CV:  No chest pain,  Orthopnea, PND, swelling in lower extremities, anasarca, dizziness, palpitations, syncope.   GI  No heartburn, indigestion, abdominal pain, nausea, vomiting, diarrhea, change in bowel habits, loss of appetite, bloody stools.   Resp:  No change in color of mucus.  No wheezing.  No chest wall deformity  Skin: no rash or lesions.  GU: no dysuria, change in color of urine, no urgency or frequency.  No flank pain, no hematuria   MS:  No joint pain or swelling.  No decreased range of motion.  No back pain.  Psych:  No change in mood or affect. No depression or anxiety.  No memory loss.         Objective:   Physical Exam GEN: A/Ox3; pleasant , NAD, well nourished   HEENT:  [place]/AT,   EACs-clear, TMs-wnl, NOSE-clear, THROAT-clear, no lesions, no postnasal drip or exudate noted.   NECK:  Supple w/ fair ROM; no JVD; normal carotid impulses w/o bruits; no thyromegaly or nodules palpated; no lymphadenopathy.  RESP  Clear  P & A; w/o, wheezes/ rales/ or rhonchi.no accessory muscle use, no dullness to percussion  CARD:  RRR, no m/r/g  , no peripheral edema, pulses intact, no cyanosis or clubbing.  GI:   Soft & nt; nml bowel sounds; no organomegaly or masses detected.  Musco: Warm bil, no deformities or joint swelling noted.   Neuro: alert, no focal deficits noted.    Skin: Warm, no lesions or rashes         Assessment & Plan:

## 2011-07-28 NOTE — Assessment & Plan Note (Addendum)
Recent Flare - now resolving Will set up PFT on return.  Cont on o2 for now   Plan:  Continue on Advair 1 puff Twice daily    Follow up in 3-4 weeks with Dr. Vassie Loll or Dr. Craige Cotta with PFT and As needed   May wear Oxygen As needed  -will decide on return if we can stop .  We have set up for an overnight oximetry to evaluate your oxygen at night.

## 2011-07-31 ENCOUNTER — Encounter (INDEPENDENT_AMBULATORY_CARE_PROVIDER_SITE_OTHER): Payer: Medicare Other | Admitting: Cardiology

## 2011-07-31 DIAGNOSIS — I7092 Chronic total occlusion of artery of the extremities: Secondary | ICD-10-CM

## 2011-07-31 DIAGNOSIS — I739 Peripheral vascular disease, unspecified: Secondary | ICD-10-CM

## 2011-08-08 ENCOUNTER — Ambulatory Visit (INDEPENDENT_AMBULATORY_CARE_PROVIDER_SITE_OTHER): Payer: Medicare Other | Admitting: Internal Medicine

## 2011-08-08 ENCOUNTER — Encounter: Payer: Self-pay | Admitting: Internal Medicine

## 2011-08-08 VITALS — BP 114/62 | HR 68 | Temp 98.2°F | Ht 63.5 in | Wt 125.6 lb

## 2011-08-08 DIAGNOSIS — E041 Nontoxic single thyroid nodule: Secondary | ICD-10-CM

## 2011-08-08 DIAGNOSIS — M949 Disorder of cartilage, unspecified: Secondary | ICD-10-CM

## 2011-08-08 DIAGNOSIS — I6529 Occlusion and stenosis of unspecified carotid artery: Secondary | ICD-10-CM

## 2011-08-08 DIAGNOSIS — F17200 Nicotine dependence, unspecified, uncomplicated: Secondary | ICD-10-CM

## 2011-08-08 DIAGNOSIS — Z0000 Encounter for general adult medical examination without abnormal findings: Secondary | ICD-10-CM

## 2011-08-08 DIAGNOSIS — M899 Disorder of bone, unspecified: Secondary | ICD-10-CM

## 2011-08-08 DIAGNOSIS — D72829 Elevated white blood cell count, unspecified: Secondary | ICD-10-CM

## 2011-08-08 DIAGNOSIS — I2510 Atherosclerotic heart disease of native coronary artery without angina pectoris: Secondary | ICD-10-CM

## 2011-08-08 DIAGNOSIS — E785 Hyperlipidemia, unspecified: Secondary | ICD-10-CM

## 2011-08-08 DIAGNOSIS — J449 Chronic obstructive pulmonary disease, unspecified: Secondary | ICD-10-CM

## 2011-08-08 DIAGNOSIS — I10 Essential (primary) hypertension: Secondary | ICD-10-CM

## 2011-08-08 NOTE — Assessment & Plan Note (Signed)
Last u/s 4-12 stable

## 2011-08-08 NOTE — Assessment & Plan Note (Addendum)
WBC elevated at the hospital, recheck

## 2011-08-08 NOTE — Assessment & Plan Note (Addendum)
Td 08 had a flu shot pneumonia shot 07-18-10 shingles immunization-- issue discussed , not ready to have it, benefits discussed  Female exam per Dr. Lily Peer, last visit September 2011.  Reluctant to go back , feels does not need a female exam---> will reasses need for a PAP next year Breast exam done today. 04-2010 negative mammogram, strongly rec to call and make an appointment   Never had a colonoscopy, not interested  Provided  iFOB but she will  call if he decides to have a  colonoscopy   counseling about diet, gradual exercise    Labs reviewed

## 2011-08-08 NOTE — Assessment & Plan Note (Signed)
Seems stable, recently found to be hypoxic at the hospital, used O2 temporarily; currently asx, O2 sat 97% RA

## 2011-08-08 NOTE — Assessment & Plan Note (Signed)
Recent events reviewed, stable, doing well

## 2011-08-08 NOTE — Assessment & Plan Note (Signed)
At goal.  

## 2011-08-08 NOTE — Assessment & Plan Note (Signed)
Well controlled 

## 2011-08-08 NOTE — Assessment & Plan Note (Signed)
stable °

## 2011-08-08 NOTE — Assessment & Plan Note (Addendum)
Vit D 08-2010 wnl DEXA 04-2010 T score -1.6 (was -1.4 before) rec OTCs, exercise

## 2011-08-08 NOTE — Progress Notes (Signed)
Subjective:    Patient ID: Patricia [name], female    DOB: 04/07/1941, 70 y.o.   MRN: [id number]  HPI Here for Medicare AWV: 1. Risk factors based on Past M, S, F history: reviewed  2. Physical Activities: active at home , no routine exercise  3. Depression/mood: mood wnl, on clonazepam prn  4. Hearing: no problems reported or noted  5. ADL's: totally independent , still drives  6. Fall Risk: no recent falls  7. Home Safety:  does feel safe at home  8. Height, weight, &visual acuity:see VS, vision seems very good to patient . Uses glasses ; rec                  To see the  eye doctor! 9. Counseling: yes  10. Labs ordered based on risk factors: yes  11.           Referral Coordination, if needed  12.           Care Plan, see a/p 13.            Cognitive Assessment: cognition, moptor skills and memory seem appropiate  in addition, we discussed the following CAD-- s/p PCI 06-2011, cards note from few days ago reviewed,doing well COPD-- noted to be hypoxic few weeks ago at the hospital, had home O2 x few weeks, currently not on it, per pulmonary note was to used it prn, has not feel the need of use it Hyperlipidemia-- good medication compliance  Thyroid nodule-- last u/s   11-2010 , stable  TOBACCO ABUSE-- no tobacco x 5.5 weeks , using the patch   Past Medical History:  CV DISEASE CAROTID ARTERY DISEASE-- moderate asymptomatic CAD, dx 07-2010----cath, stent ; 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   stroke --no colon  ca--no breast cancer --no     Social History: retired from office work since 2003.  Widow 1 child , son, commited suicide 2010 Gson lives w/ her  tobbacco-- quit x 5.5 weeks alcohol-- socially   Review of Systems  Constitutional: Negative for fever and fatigue. Unexpected weight change: gained some wt since she quit tobacco.  Respiratory: Negative for cough, shortness of breath and wheezing.   Cardiovascular: Negative for chest pain and leg swelling.  Gastrointestinal: Negative for abdominal pain and blood in stool.  Genitourinary: Negative for dysuria and hematuria.       Objective:   Physical Exam  Constitutional: She is oriented to person, place, and time. She appears well-developed and well-nourished. No distress.  HENT:  Head: Normocephalic and atraumatic.  Neck:       Good carotid pulses  Cardiovascular: Normal rate, regular rhythm and normal heart sounds.   No murmur heard. Pulmonary/Chest: Effort normal. No respiratory distress. She has no wheezes. She has no rales.       Decreased breath sounds otherwise normal  Abdominal: Soft. She exhibits no distension. There is no tenderness. There is no rebound and no guarding.  Genitourinary:       Bilateral breast exam without dominant nodules, and no axillary lymphadenopathy  Musculoskeletal: She exhibits no edema.  Neurological: She is alert  and oriented to person, place, and time.  Skin: She is not diaphoretic.  Psychiatric: She has a normal mood and affect. Her behavior is normal. Judgment and thought content normal.      Assessment & Plan:

## 2011-08-09 LAB — CBC WITH DIFFERENTIAL/PLATELET
Basophils Absolute: 0 10*3/uL (ref 0.0–0.1)
Basophils Relative: 0 % (ref 0–1)
Eosinophils Absolute: 0.1 10*3/uL (ref 0.0–0.7)
Lymphs Abs: 2.4 10*3/uL (ref 0.7–4.0)
MCH: 30.5 pg (ref 26.0–34.0)
MCHC: 32.2 g/dL (ref 30.0–36.0)
Neutrophils Relative %: 58 % (ref 43–77)
Platelets: 305 10*3/uL (ref 150–400)
RBC: 4.42 MIL/uL (ref 3.87–5.11)

## 2011-08-10 ENCOUNTER — Encounter: Payer: Self-pay | Admitting: Internal Medicine

## 2011-08-10 NOTE — Assessment & Plan Note (Signed)
Off tobacco x 5 weeks!

## 2011-08-11 ENCOUNTER — Telehealth: Payer: Self-pay | Admitting: Cardiovascular Disease

## 2011-08-11 ENCOUNTER — Encounter: Payer: Self-pay | Admitting: *Deleted

## 2011-08-11 ENCOUNTER — Telehealth: Payer: Self-pay | Admitting: Adult Health

## 2011-08-11 NOTE — Telephone Encounter (Signed)
Fu call °Pt returning your call  °

## 2011-08-11 NOTE — Telephone Encounter (Signed)
I spoke with pt and she states she is wanting to d/c her oxygen bc she never wears it and it is just taking up space. Pt states she was given this when she left the hospital in November. She states when she left the hospital a home health nurse would come out and check her vitals and her oxygen levels would be in the 90's. Pt also wanting to know if tammy wants her to continue her spiriva and advair. I advised her according to last note she was to continue on advair 1 puff BID and will ask about the spiriva. Pt is not scheduled to see RA until 08/19/11 w/ pft's. Please advise Tammy, thanks

## 2011-08-11 NOTE — Telephone Encounter (Signed)
Pt aware of LEA duplex.  The pt has an upcoming appointment on 08/18/11 with Dr Excell Seltzer.

## 2011-08-13 NOTE — Telephone Encounter (Signed)
If possible explain that we wanted to wait until follow up ov to recheck O2 levels and if good we will d/c at follow up ov on return next week.  Cont on advair and spiriva as rec follow up Dr. Vassie Loll  Next week as planned  Please contact office for sooner follow up if symptoms do not improve or worsen or seek emergency care

## 2011-08-13 NOTE — Telephone Encounter (Signed)
I spoke with pt and is aware of TP recs. She voiced her understanding and had no questions

## 2011-08-15 ENCOUNTER — Encounter: Payer: Self-pay | Admitting: Adult Health

## 2011-08-18 ENCOUNTER — Encounter: Payer: Self-pay | Admitting: Cardiovascular Disease

## 2011-08-18 ENCOUNTER — Encounter: Payer: Self-pay | Admitting: Adult Health

## 2011-08-18 ENCOUNTER — Ambulatory Visit (INDEPENDENT_AMBULATORY_CARE_PROVIDER_SITE_OTHER): Payer: Medicare Other | Admitting: Cardiovascular Disease

## 2011-08-18 DIAGNOSIS — I739 Peripheral vascular disease, unspecified: Secondary | ICD-10-CM

## 2011-08-18 DIAGNOSIS — F17200 Nicotine dependence, unspecified, uncomplicated: Secondary | ICD-10-CM

## 2011-08-18 DIAGNOSIS — I2510 Atherosclerotic heart disease of native coronary artery without angina pectoris: Secondary | ICD-10-CM | POA: Diagnosis not present

## 2011-08-18 DIAGNOSIS — I6529 Occlusion and stenosis of unspecified carotid artery: Secondary | ICD-10-CM | POA: Diagnosis not present

## 2011-08-18 NOTE — Progress Notes (Signed)
HPI: The patient is 71 years old and returns for followup of coronary and peripheral arterial disease. She has had previous coronary stenting and presented in November 2012 with unstable angina. She was found to have severe in-stent restenosis and underwent balloon angioplasty of a complex area of the left circumflex. She has a history of heavy tobacco use and was noted to have prolonged hypoxemia during her hospital stay in November. She was discharged on home oxygen and was able to discontinue this last month. She is feeling much better and notes marked improvement in her breathing. She's had no recurrent chest pain or pressure. She denies leg pain, lightheadedness, palpitations, orthopnea, or PND.  She quit smoking during her recent hospitalization and has been able to abstain from cigarettes since discharge home.    No Known Allergies  Past Medical History  Diagnosis Date  . Carotid artery disease     moderate; asymptomatic  . CAD (coronary artery disease)     s/p DES to the circumflex 12/11;  LHC 07/02/11: pLAD 30%, pCFX stent with 95% ISR involving origin of OM1 with 90-95% prox stenosis, oRCA 95% - vessel is sub totally occluded and fills from LCA collats; EF 55-65%.  PCI:  Complex PCI (POBA) of the CFX/OM bifurcation with IVUS guidance;  Echocardiogram 07/04/11: Mild LVH, EF 60-65%  . Osteopenia   . COPD (chronic obstructive pulmonary disease)   . Hyperlipidemia   . Thyroid nodule   . Abnormal LFTs   . Depression   . HTN (hypertension)     intolerant to ACEi (cough)  . Allergic rhinitis   . Anxiety   . Panic attacks   . PAD (peripheral artery disease)     At [facility] 11/12:  critical stenosis noted just below the sheath site leading into a totally occluded SFA and a patent deep femoral artery    ROS: Negative except as per HPI  BP 142/80  Pulse 60  Ht 5\' 3"  (1.6 m)  Wt 58.06 kg (128 lb)  BMI 22.67 kg/m2  PHYSICAL EXAM: Pt is alert and oriented, NAD HEENT: normal Neck: JVP -  normal, carotids 2+= with bilateral carotid bruits Lungs: CTA bilaterally CV: RRR with a grade 2/6 early systolic ejection murmur at the left sternal border Abd: soft, NT, Positive BS, no hepatomegaly Ext: no C/C/E Skin: warm/dry no rash  ASSESSMENT AND PLAN:

## 2011-08-18 NOTE — Assessment & Plan Note (Signed)
The patient is stable without anginal symptoms. She should remain on dual antiplatelet therapy with aspirin and Plavix. I would like to see her back in followup in 6 months. She is on appropriate risk reduction medications. We discussed the importance of continued tobacco cessation. She will try to initiate a walking program to improve her cardiopulmonary condition.

## 2011-08-18 NOTE — Assessment & Plan Note (Signed)
The patient has moderate carotid stenosis. She is on appropriate medical therapy. She should have a followup carotid duplex within the next 6 months.

## 2011-08-18 NOTE — Assessment & Plan Note (Signed)
We discussed the importance of continued tobacco cessation. The patient received counseling today. She understands how this impacts her coronary disease, peripheral arterial disease, and lung problems.

## 2011-08-18 NOTE — Assessment & Plan Note (Signed)
The patient has moderate lower extremity PAD. She is total occlusion of the right superficial femoral artery. She is asymptomatic and will be treated medically.

## 2011-08-18 NOTE — Patient Instructions (Signed)
Your physician wants you to follow-up in: 6 MONTHS.  You will receive a reminder letter in the mail two months in advance. If you don't receive a letter, please call our office to schedule the follow-up appointment.  Your physician has requested that you have a carotid duplex in 6 MONTHS. . This test is an ultrasound of the carotid arteries in your neck. It looks at blood flow through these arteries that supply the brain with blood. Allow one hour for this exam. There are no restrictions or special instructions.  Your physician recommends that you continue on your current medications as directed. Please refer to the Current Medication list given to you today.  

## 2011-08-25 ENCOUNTER — Institutional Professional Consult (permissible substitution): Payer: Medicare Other | Admitting: Pulmonary Disease

## 2011-08-29 ENCOUNTER — Ambulatory Visit (INDEPENDENT_AMBULATORY_CARE_PROVIDER_SITE_OTHER): Payer: Medicare Other | Admitting: Pulmonary Disease

## 2011-08-29 ENCOUNTER — Encounter: Payer: Self-pay | Admitting: Pulmonary Disease

## 2011-08-29 VITALS — BP 148/72 | HR 73 | Temp 97.9°F | Ht 63.0 in | Wt 128.0 lb

## 2011-08-29 DIAGNOSIS — J449 Chronic obstructive pulmonary disease, unspecified: Secondary | ICD-10-CM

## 2011-08-29 LAB — PULMONARY FUNCTION TEST

## 2011-08-29 NOTE — Progress Notes (Signed)
PFT done today. 

## 2011-08-29 NOTE — Progress Notes (Signed)
  Subjective:    Patient ID: Patricia [name], female    DOB: 12-04-1940, 71 y.o.   MRN: [id number]  HPI  23 yowf smoker admitted 07/02/11 with chest pain, L Heart cath revealed in stent restenosis - PCI performed with improvement in CP. Noted to have persistent hypoxemia. V/Q revealed no significant perfusion defects. CXR showed hyperinflation. She was started on Advair and Spiriva. Discharged on home O2 due to exertional hypoxia.    08/29/2011 Since discharge she is feeling better. Has stopped smoking. No fever or discolored mucus. Decreased dyspnea. No chest pain.  she is wanting to d/c her oxygen- [facility] - bc she never wears it and it is just taking up space. Pt states she was given this when she left the hospital in November. She states when she left the hospital a home health nurse would come out and check her vitals and her oxygen levels would be in the 90's. Pt also wanting to know if she should continue her spiriva and advair. PFTs >> ratio 56, FEV1 92%, small airways 26%, no BD response, some air trapping, DLCO 66% C/w emphysema Did not desaturate with exertion   Review of Systems  Constitutional: Negative for fever and unexpected weight change.  HENT: Negative for ear pain, nosebleeds, congestion, sore throat, rhinorrhea, sneezing, trouble swallowing, dental problem, postnasal drip and sinus pressure.   Eyes: Negative for redness and itching.  Respiratory: Positive for cough. Negative for chest tightness, shortness of breath and wheezing.   Cardiovascular: Negative for palpitations and leg swelling.  Gastrointestinal: Negative for nausea and vomiting.  Genitourinary: Negative for dysuria.  Musculoskeletal: Negative for joint swelling.  Skin: Negative for rash.  Neurological: Negative for headaches.  Hematological: Does not bruise/bleed easily.  Psychiatric/Behavioral: Negative for dysphoric mood. The patient is not nervous/anxious.        Objective:   Physical Exam  Gen.  Pleasant, thin woman, in no distress, normal affect ENT - no lesions, no post nasal drip Neck: No JVD, no thyromegaly, no carotid bruits Lungs: no use of accessory muscles, no dullness to percussion, clear without rales or rhonchi  Cardiovascular: Rhythm regular, heart sounds  normal, no murmurs or gallops, no peripheral edema Abdomen: soft and non-tender, no hepatosplenomegaly, BS normal. Musculoskeletal: No deformities, no cyanosis or clubbing Neuro:  alert, non focal       Assessment & Plan:

## 2011-08-29 NOTE — Patient Instructions (Signed)
If oxygen stays good while walking, will send order to dc Can stop taking advair Stay on spiriva Pulm rehab referral will be sent Congratulations on quitting - we dicsussed side effects of chantix -let us know if you need help with staying quit

## 2011-08-29 NOTE — Assessment & Plan Note (Addendum)
OK to dc O2 Can stop taking advair Stay on spiriva Pulm rehab referral will be sent Congratulations on quitting - we dicsussed side effects of chantix -let us know if you need help with staying quit

## 2011-09-12 ENCOUNTER — Encounter: Payer: Self-pay | Admitting: Pulmonary Disease

## 2011-09-23 ENCOUNTER — Other Ambulatory Visit: Payer: Self-pay | Admitting: *Deleted

## 2011-09-23 ENCOUNTER — Telehealth: Payer: Self-pay | Admitting: Pulmonary Disease

## 2011-09-23 MED ORDER — TIOTROPIUM BROMIDE MONOHYDRATE 18 MCG IN CAPS: 18.0000 ug | ORAL_CAPSULE | Freq: Every day | RESPIRATORY_TRACT | Status: DC

## 2011-09-23 MED ORDER — VARENICLINE TARTRATE 0.5 MG X 11 & 1 MG X 42 PO MISC: ORAL | Status: DC

## 2011-09-23 NOTE — Telephone Encounter (Signed)
Called and spoke with pt.  Pt last saw RA on 08/29/11 and was told to continue on Spiriva and to call if she needed help staying quit from smoking.  Informed pt rx for spiriva sent to pharmacy.    Pt states she is on the nicotine patches currently and is still "sneaking a cigarette" here and there and is therefore requesting rx for chantix.  RA please advise if ok or not.  Thanks!

## 2011-09-23 NOTE — Telephone Encounter (Signed)
Ok to send Rx for chantix starter pack FU with TP for continuation pack - if no side effects

## 2011-09-23 NOTE — Telephone Encounter (Signed)
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 Please advise.

## 2011-09-23 NOTE — Telephone Encounter (Signed)
Called and spoke with pt.  Informed her rx sent to pharmacy for chantix and pt scheduled f/u appt with TP for 10/21/11 at 3 pm

## 2011-09-24 MED ORDER — HYDROCODONE-ACETAMINOPHEN 5-500 MG PO TABS: 1.0000 | ORAL_TABLET | Freq: Three times a day (TID) | ORAL | Status: AC | PRN

## 2011-09-24 NOTE — Telephone Encounter (Signed)
Spoke with pt & she stated that she has been taking Tylenol & it has not been helping. Sent in prescription for Vicodin 5/500.

## 2011-09-24 NOTE — Telephone Encounter (Signed)
If the pain is moderate, over-the-counter Tylenol 500 mg one or 2 tablets 4 times a day as needed. If the pain is more intense, instead of Tylenol use Vicodin 5/500 one tablet every 6 hours as needed. Will make her sleepy. #30 no refills. OV if sx are ongoing

## 2011-10-15 ENCOUNTER — Inpatient Hospital Stay (HOSPITAL_COMMUNITY): Admission: RE | Admit: 2011-10-15 | Payer: Medicare Other | Source: Ambulatory Visit

## 2011-10-16 ENCOUNTER — Ambulatory Visit (HOSPITAL_COMMUNITY): Payer: Medicare Other

## 2011-10-21 ENCOUNTER — Ambulatory Visit (INDEPENDENT_AMBULATORY_CARE_PROVIDER_SITE_OTHER): Payer: Medicare Other | Admitting: Adult Health

## 2011-10-21 ENCOUNTER — Encounter: Payer: Self-pay | Admitting: Adult Health

## 2011-10-21 DIAGNOSIS — J449 Chronic obstructive pulmonary disease, unspecified: Secondary | ICD-10-CM | POA: Diagnosis not present

## 2011-10-21 NOTE — Assessment & Plan Note (Signed)
Compensated on present regimen.  Encouraged on smoking cesstation  

## 2011-10-21 NOTE — Progress Notes (Signed)
  Subjective:    Patient ID: Patricia [name], female    DOB: 06-03-1941, 71 y.o.   MRN: [id number]  HPI 34 yowf  Smoker  admitted 07/02/11 with chest pain, L Heart cath revealed in stent restenosis - PCI performed with improvement in CP. Noted to have persistent hypoxemia. V/Q revealed no significant perfusion defects. CXR showed hyperinflation. She was started on Advair and Spiriva. Discharged on home O2 due to exertional hypoxia.    08/29/2011 Since discharge she is feeling better. Has stopped smoking. No fever or discolored mucus. Decreased dyspnea. No chest pain.  she is wanting to d/c her oxygen- [facility] - bc she never wears it and it is just taking up space. Pt states she was given this when she left the hospital in November. She states when she left the hospital a home health nurse would come out and check her vitals and her oxygen levels would be in the 90's. Pt also wanting to know if she should continue her spiriva and advair. PFTs >> ratio 56, FEV1 92%, small airways 26%, no BD response, some air trapping, DLCO 66% C/w emphysema Did not desaturate with exertion >>O2 d/c   10/21/2011 Acute OV  1 month follow up . She is doing well with no flare in cough or dyspnea. Last ov O2 and Advair stopped. Currently on Chantix - reports urge has decreased. Is planning on setting date to quit soon. Does have some s vivid dreams and changes in sleep pattern. NO suicidal ideations. No depression symptoms.  Set up for pulmonary rehab on 11/05/11 .    Review of Systems  Constitutional:   No  weight loss, night sweats,  Fevers, chills, fatigue, or  lassitude.  HEENT:   No headaches,  Difficulty swallowing,  Tooth/dental problems, or  Sore throat,                No sneezing, itching, ear ache, nasal congestion, post nasal drip,   CV:  No chest pain,  Orthopnea, PND, swelling in lower extremities, anasarca, dizziness, palpitations, syncope.   GI  No heartburn, indigestion, abdominal pain, nausea, vomiting,  diarrhea, change in bowel habits, loss of appetite, bloody stools.   Resp:  No excess mucus, no productive cough,  No non-productive cough,  No coughing up of blood.  No change in color of mucus.  No wheezing.  No chest wall deformity  Skin: no rash or lesions.  GU: no dysuria, change in color of urine, no urgency or frequency.  No flank pain, no hematuria   MS:  No joint pain or swelling.  No decreased range of motion.  No back pain.  Psych:  No change in mood or affect. No depression or anxiety.  No memory loss.         Objective:   Physical Exam  Gen. Pleasant, thin woman, in no distress, normal affect ENT - no lesions, no post nasal drip Neck: No JVD, no thyromegaly, no carotid bruits Lungs: no use of accessory muscles, no dullness to percussion, clear without rales or rhonchi  Cardiovascular: Rhythm regular, heart sounds  normal, no murmurs or gallops, no peripheral edema Abdomen: soft and non-tender, no hepatosplenomegaly, BS normal. Musculoskeletal: No deformities, no cyanosis or clubbing Neuro:  alert, non focal       Assessment & Plan:

## 2011-10-21 NOTE — Patient Instructions (Signed)
Continue on Spiriva daily  Start pulmonary rehab as planned.  Continue on Chantix as directed.  Set quit date .  Please contact office for sooner follow up if symptoms do not improve or worsen or seek emergency care  follow up Dr. Vassie Loll  In 2 months

## 2011-10-22 ENCOUNTER — Ambulatory Visit (HOSPITAL_COMMUNITY): Payer: Medicare Other

## 2011-10-23 ENCOUNTER — Other Ambulatory Visit: Payer: Self-pay | Admitting: Pulmonary Disease

## 2011-10-23 ENCOUNTER — Ambulatory Visit (HOSPITAL_COMMUNITY): Payer: Medicare Other

## 2011-10-23 ENCOUNTER — Encounter (HOSPITAL_COMMUNITY): Payer: Medicare Other

## 2011-10-24 ENCOUNTER — Telehealth: Payer: Self-pay | Admitting: Pulmonary Disease

## 2011-10-24 NOTE — Telephone Encounter (Signed)
Please advise if okay to refill chantix for pt Dr. Vassie Loll, thanks   Last fill 09/23/11

## 2011-10-24 NOTE — Telephone Encounter (Signed)
RA, rx was sent for chantix starting month pack- but the pt has been taking med since Jan. Did you mean to send cont month pack? I see where pt had told TP having vivid dreams from taking med but it was helping with urges to smoke. Please advise, thanks!

## 2011-10-26 NOTE — Telephone Encounter (Signed)
Ok for continuation pack -ok to take x 6 months total until June Reassess on fu visit

## 2011-10-27 MED ORDER — VARENICLINE TARTRATE 1 MG PO TABS
1.0000 mg | ORAL_TABLET | Freq: Two times a day (BID) | ORAL | Status: DC
Start: 2011-10-27 — End: 2011-12-26

## 2011-10-27 NOTE — Telephone Encounter (Signed)
Called and spoke with [facility] from [facility] and informed him of RA's response/recs.  Nothing further needed.

## 2011-10-28 ENCOUNTER — Encounter (HOSPITAL_COMMUNITY): Payer: Medicare Other

## 2011-10-29 ENCOUNTER — Other Ambulatory Visit: Payer: Self-pay

## 2011-10-29 MED ORDER — CLONAZEPAM 0.5 MG PO TABS: 0.5000 mg | ORAL_TABLET | ORAL | Status: DC | PRN

## 2011-10-30 ENCOUNTER — Encounter (HOSPITAL_COMMUNITY): Payer: Medicare Other

## 2011-10-31 ENCOUNTER — Other Ambulatory Visit: Payer: Self-pay

## 2011-10-31 MED ORDER — CLONAZEPAM 0.5 MG PO TABS: 0.5000 mg | ORAL_TABLET | ORAL | Status: DC | PRN

## 2011-11-04 ENCOUNTER — Encounter (HOSPITAL_COMMUNITY): Payer: Medicare Other

## 2011-11-05 ENCOUNTER — Encounter (HOSPITAL_COMMUNITY): Payer: Self-pay

## 2011-11-05 ENCOUNTER — Encounter (HOSPITAL_COMMUNITY)
Admission: RE | Admit: 2011-11-05 | Discharge: 2011-11-05 | Disposition: A | Discharge status: Home / Self Care | Payer: Medicare Other | Source: Ambulatory Visit | Attending: Pulmonary Disease | Admitting: Pulmonary Disease

## 2011-11-05 NOTE — Progress Notes (Signed)
Patricia [name] came to Pulmonary Rehab today for orientation.  She plans to start Rehab on 11-12-11.   History, medications, and nurse assessment reviewed with patient.She is currently taking Chantix for smoking cessation.  Still smoking a few cigs per day.  She was given and artificial cigarette and a loaner copy of " The Easy Way To Quit".  Tolerated 6 min walk test well. Oxygen saturations stable above 95% on room air.  Goal to be able to do more at home, do some work on patio this spring and to shop without so much shortness of breath and fatigue. In review of her Cardiac History and PAD, we discussed NTG use, signs and symptoms of stroke and heart attack, and 911.

## 2011-11-06 ENCOUNTER — Encounter (HOSPITAL_COMMUNITY): Payer: Medicare Other | Attending: Pulmonary Disease

## 2011-11-11 ENCOUNTER — Encounter (HOSPITAL_COMMUNITY)
Admission: RE | Admit: 2011-11-11 | Discharge: 2011-11-11 | Disposition: A | Discharge status: Home / Self Care | Payer: Medicare Other | Source: Ambulatory Visit | Attending: Pulmonary Disease | Admitting: Pulmonary Disease

## 2011-11-11 DIAGNOSIS — I517 Cardiomegaly: Secondary | ICD-10-CM | POA: Diagnosis not present

## 2011-11-11 DIAGNOSIS — F329 Major depressive disorder, single episode, unspecified: Secondary | ICD-10-CM | POA: Diagnosis not present

## 2011-11-11 DIAGNOSIS — F17200 Nicotine dependence, unspecified, uncomplicated: Secondary | ICD-10-CM | POA: Diagnosis not present

## 2011-11-11 DIAGNOSIS — R0902 Hypoxemia: Secondary | ICD-10-CM | POA: Insufficient documentation

## 2011-11-11 DIAGNOSIS — Z7902 Long term (current) use of antithrombotics/antiplatelets: Secondary | ICD-10-CM | POA: Insufficient documentation

## 2011-11-11 DIAGNOSIS — I10 Essential (primary) hypertension: Secondary | ICD-10-CM | POA: Insufficient documentation

## 2011-11-11 DIAGNOSIS — Z9981 Dependence on supplemental oxygen: Secondary | ICD-10-CM | POA: Insufficient documentation

## 2011-11-11 DIAGNOSIS — F411 Generalized anxiety disorder: Secondary | ICD-10-CM | POA: Diagnosis not present

## 2011-11-11 DIAGNOSIS — I2510 Atherosclerotic heart disease of native coronary artery without angina pectoris: Secondary | ICD-10-CM | POA: Insufficient documentation

## 2011-11-11 DIAGNOSIS — I6529 Occlusion and stenosis of unspecified carotid artery: Secondary | ICD-10-CM | POA: Insufficient documentation

## 2011-11-11 DIAGNOSIS — Z79899 Other long term (current) drug therapy: Secondary | ICD-10-CM | POA: Diagnosis not present

## 2011-11-11 DIAGNOSIS — Z9861 Coronary angioplasty status: Secondary | ICD-10-CM | POA: Insufficient documentation

## 2011-11-11 DIAGNOSIS — J449 Chronic obstructive pulmonary disease, unspecified: Secondary | ICD-10-CM | POA: Diagnosis not present

## 2011-11-11 DIAGNOSIS — E785 Hyperlipidemia, unspecified: Secondary | ICD-10-CM | POA: Insufficient documentation

## 2011-11-11 DIAGNOSIS — Z5189 Encounter for other specified aftercare: Secondary | ICD-10-CM | POA: Insufficient documentation

## 2011-11-11 DIAGNOSIS — I252 Old myocardial infarction: Secondary | ICD-10-CM | POA: Diagnosis not present

## 2011-11-11 DIAGNOSIS — Z7982 Long term (current) use of aspirin: Secondary | ICD-10-CM | POA: Insufficient documentation

## 2011-11-11 DIAGNOSIS — J4489 Other specified chronic obstructive pulmonary disease: Secondary | ICD-10-CM | POA: Insufficient documentation

## 2011-11-11 DIAGNOSIS — F3289 Other specified depressive episodes: Secondary | ICD-10-CM | POA: Insufficient documentation

## 2011-11-11 NOTE — Progress Notes (Signed)
First day of exercise in Pulmonary Rehab.  She was oriented to equipment use and safety, RPE and Dyspnea Scales and rest breaks.  PLB  Demonstration and practice on each exercise station.  Tolerated well.

## 2011-11-13 ENCOUNTER — Encounter (HOSPITAL_COMMUNITY)
Admission: RE | Admit: 2011-11-13 | Discharge: 2011-11-13 | Disposition: A | Discharge status: Home / Self Care | Payer: Medicare Other | Source: Ambulatory Visit | Attending: Pulmonary Disease | Admitting: Pulmonary Disease

## 2011-11-13 DIAGNOSIS — R0902 Hypoxemia: Secondary | ICD-10-CM | POA: Diagnosis not present

## 2011-11-13 DIAGNOSIS — Z9861 Coronary angioplasty status: Secondary | ICD-10-CM | POA: Diagnosis not present

## 2011-11-13 DIAGNOSIS — Z5189 Encounter for other specified aftercare: Secondary | ICD-10-CM | POA: Diagnosis not present

## 2011-11-13 DIAGNOSIS — Z9981 Dependence on supplemental oxygen: Secondary | ICD-10-CM | POA: Diagnosis not present

## 2011-11-13 DIAGNOSIS — J449 Chronic obstructive pulmonary disease, unspecified: Secondary | ICD-10-CM | POA: Diagnosis not present

## 2011-11-13 DIAGNOSIS — I2510 Atherosclerotic heart disease of native coronary artery without angina pectoris: Secondary | ICD-10-CM | POA: Diagnosis not present

## 2011-11-18 ENCOUNTER — Encounter (HOSPITAL_COMMUNITY)
Admission: RE | Admit: 2011-11-18 | Discharge: 2011-11-18 | Disposition: A | Discharge status: Home / Self Care | Payer: Medicare Other | Source: Ambulatory Visit | Attending: Pulmonary Disease | Admitting: Pulmonary Disease

## 2011-11-18 DIAGNOSIS — Z9861 Coronary angioplasty status: Secondary | ICD-10-CM | POA: Diagnosis not present

## 2011-11-18 DIAGNOSIS — R0902 Hypoxemia: Secondary | ICD-10-CM | POA: Diagnosis not present

## 2011-11-18 DIAGNOSIS — Z5189 Encounter for other specified aftercare: Secondary | ICD-10-CM | POA: Diagnosis not present

## 2011-11-18 DIAGNOSIS — I2510 Atherosclerotic heart disease of native coronary artery without angina pectoris: Secondary | ICD-10-CM | POA: Diagnosis not present

## 2011-11-18 DIAGNOSIS — J449 Chronic obstructive pulmonary disease, unspecified: Secondary | ICD-10-CM | POA: Diagnosis not present

## 2011-11-18 DIAGNOSIS — Z9981 Dependence on supplemental oxygen: Secondary | ICD-10-CM | POA: Diagnosis not present

## 2011-11-20 ENCOUNTER — Encounter (HOSPITAL_COMMUNITY)
Admission: RE | Admit: 2011-11-20 | Discharge: 2011-11-20 | Disposition: A | Discharge status: Home / Self Care | Payer: Medicare Other | Source: Ambulatory Visit | Attending: Pulmonary Disease | Admitting: Pulmonary Disease

## 2011-11-20 DIAGNOSIS — J449 Chronic obstructive pulmonary disease, unspecified: Secondary | ICD-10-CM | POA: Diagnosis not present

## 2011-11-20 DIAGNOSIS — I2510 Atherosclerotic heart disease of native coronary artery without angina pectoris: Secondary | ICD-10-CM | POA: Diagnosis not present

## 2011-11-20 DIAGNOSIS — Z9981 Dependence on supplemental oxygen: Secondary | ICD-10-CM | POA: Diagnosis not present

## 2011-11-20 DIAGNOSIS — R0902 Hypoxemia: Secondary | ICD-10-CM | POA: Diagnosis not present

## 2011-11-20 DIAGNOSIS — Z9861 Coronary angioplasty status: Secondary | ICD-10-CM | POA: Diagnosis not present

## 2011-11-20 DIAGNOSIS — Z5189 Encounter for other specified aftercare: Secondary | ICD-10-CM | POA: Diagnosis not present

## 2011-11-25 ENCOUNTER — Encounter (HOSPITAL_COMMUNITY)
Admission: RE | Admit: 2011-11-25 | Discharge: 2011-11-25 | Disposition: A | Discharge status: Home / Self Care | Payer: Medicare Other | Source: Ambulatory Visit | Attending: Pulmonary Disease | Admitting: Pulmonary Disease

## 2011-11-25 ENCOUNTER — Telehealth: Payer: Self-pay | Admitting: Internal Medicine

## 2011-11-25 DIAGNOSIS — R0902 Hypoxemia: Secondary | ICD-10-CM | POA: Diagnosis not present

## 2011-11-25 DIAGNOSIS — Z5189 Encounter for other specified aftercare: Secondary | ICD-10-CM | POA: Diagnosis not present

## 2011-11-25 DIAGNOSIS — J449 Chronic obstructive pulmonary disease, unspecified: Secondary | ICD-10-CM | POA: Diagnosis not present

## 2011-11-25 DIAGNOSIS — Z9981 Dependence on supplemental oxygen: Secondary | ICD-10-CM | POA: Diagnosis not present

## 2011-11-25 DIAGNOSIS — Z9861 Coronary angioplasty status: Secondary | ICD-10-CM | POA: Diagnosis not present

## 2011-11-25 DIAGNOSIS — I2510 Atherosclerotic heart disease of native coronary artery without angina pectoris: Secondary | ICD-10-CM | POA: Diagnosis not present

## 2011-11-25 MED ORDER — HYDROCODONE-ACETAMINOPHEN 5-500 MG PO TABS: ORAL_TABLET | ORAL | Status: DC

## 2011-11-25 NOTE — Progress Notes (Signed)
Completed home exercise with patient. Reviewed exercise progression, routine, exercising at a comfortable pace, RPE/Dyspnea scales, how important it is to own a pulse oximeter and how to use one, weather conditions, warning signs and symptoms with exercise, and CP/NTG. We discussed when to call MD. Patient voices understanding. Patient has a goal to get in better shape with less effort, Lose a couple of inches, and increase her leg strength. Explained to patient that we will strive to get her back in shape the best we can as long as her vitals are stable and her oxygen saturations continue to be above 90% on RA. Patient voices understanding. Discussed with patient we could discuss weight training in a few more visits. Will continue to encourage and support.   Ruffin Frederick, MS, NASM, CES

## 2011-11-25 NOTE — Progress Notes (Signed)
Pulmonary Rehab Nutrition Screen  Patricia [name] August 71 y.o. female             Ht: 63" Ht Readings from Last 1 Encounters:  10/21/11 5\' 2"  (1.575 m)    Wt:   128lb (58.2 kg) Wt Readings from Last 3 Encounters:  10/21/11 128 lb 9.6 oz (58.333 kg)  08/29/11 128 lb (58.06 kg)  08/18/11 128 lb (58.06 kg)    BMI: 22.7  33%body fat                       Rate Your Plate Score: 53  Please answer the following questions:             YES  NO    Do you live in a nursing home?  X   Do you eat out more than 3 times per week?    X If yes, how many times per week do you eat out?   Do you have food allergies?   X If yes, what are you allergic to?  Have you gained or lost more than 10 lbs without trying?               X If yes, how much weight have you  lost or gained?  lbs over  weeks/mo   Do you want to lose weight?    X  If yes, what is a goal weight or amount of weight you would like to lose? 5 lbs  Do you eat alone most of the time?  X    Do you eat less than 2 meals/day?  X If yes, how many meals do you eat?  Do you use canned and convenience food? X  Convenience  Do you use a salt shaker?  X   Do you drink more than 3 alcoholic drinks/day?  X If yes, how many drinks per day?  Are you having trouble with constipation? *  X If yes, what are you doing to help relieve constipation?  Do you have financial difficulties with buying food? *  X   Do you usually need help with grocery shopping or with cooking? *  X   Do you have a poor appetite? *                                       X   Do you have trouble chewing/ swallowing? *   X   Do you take vitamin and mineral or herbal supplements? * X  If yes, what kind of supplements do you currently take? Vitamin D    Past Medical History  Diagnosis Date  . Osteopenia   . COPD (chronic obstructive pulmonary disease)   . Hyperlipidemia   . Thyroid nodule   . Abnormal LFTs   . Depression   . HTN (hypertension)     intolerant to ACEi (cough)  .  Allergic rhinitis   . Anxiety   . Panic attacks   . PAD (peripheral artery disease)     At [facility] 11/12:  critical stenosis noted just below the sheath site leading into a totally occluded SFA and a patent deep femoral artery  . Carotid artery disease     moderate; asymptomatic  . CAD (coronary artery disease)     s/p DES to the circumflex 12/11;  LHC 07/02/11: pLAD 30%, pCFX stent with 95% ISR involving origin  of OM1 with 90-95% prox stenosis, oRCA 95% - vessel is sub totally occluded and fills from LCA collats; EF 55-65%.  PCI:  Complex PCI (POBA) of the CFX/OM bifurcation with IVUS guidance;  Echocardiogram 07/04/11: Mild LVH, EF 60-65%   Labs Lipid Panel     Component Value Date/Time   CHOL 141 07/03/2011 0600   TRIG 55 07/03/2011 0600   HDL 71 07/03/2011 0600   CHOLHDL 2.0 07/03/2011 0600   VLDL 11 07/03/2011 0600   LDLCALC 59 07/03/2011 0600   No results found for this basename: HGBA1C   Nutrition Risk Level: Low Patricia [name] 71 y.o. female Nutrition Note Spoke with pt. Pt is at a normal wt.  Pt eats 3 meals a day; most prepared at home.  There are some ways the pt can improve her eating habits for her lung health. Pt's Rate Your Plate results reviewed with pt.  Pt expressed understanding.  Pt does not avoid salty food; uses convenience food. Pt lives with her 13 yr old grandson and they do not consistently eat the same meals. Pt states, "I usually will eat egg salad or something and he will get whatever he wants to eat."  Pt does not add salt to food.  The role of sodium in lung disease reviewed with pt. Pt is on Chantix and trying to quit using tobacco. Pt reports smoking 2 cigarettes daily "and they don't taste that good." Pt concerned about gaining wt with quitting tobacco use. Per nutrition screen, pt wants to lose 5 lbs. Per discussion with pt, pt is not actively doing anything to help herself lose wt. Pt encouraged to try to maintain her wt while quitting tobacco use. Pt  expressed understanding of the above. Nutrition Diagnosis   Excessive sodium intake related to over consumption of processed food as evidenced by frequent consumption of convenience food.   Food-and nutrition-related knowledge deficit related to lack of exposure to information as related to diagnosis of pulmonary disease   Inadequate vitamin intake relate to tobacco use as evidenced by pt reporting she smokes 2 cigarettes daily Nutrition Rx/Est. Daily Nutrition Needs for: ? wt loss maintenance 1600-1800 Kcal  70-85 gm protein   1500 mg or less sodium Nutrition Intervention   Pt's individual nutrition plan and goals reviewed with pt.   Benefits of adopting healthy eating habits discussed when pt's Rate Your Plate reviewed.   Pt to consider adding vitamin C supplement due to continued tobacco use.   Pt to attend the Nutrition and Lung Disease class   Continual client-centered nutrition education by RD, as part of interdisciplinary care. Goal(s) 1. Pt to identify and limit food sources of sodium. Monitor and Evaluate progress toward nutrition goal with team.

## 2011-11-25 NOTE — Telephone Encounter (Signed)
Ok 60, no RF 

## 2011-11-25 NOTE — Telephone Encounter (Signed)
RX for Hydrocodon - Acetaminophen 5-500 Take 1-tablet by mouth every 8-hours as needed for pain  Last OV 12.28.12 Last written 3.12.13 by  Ordering User: Sherre Lain, MA

## 2011-11-25 NOTE — Telephone Encounter (Signed)
Refill done.  

## 2011-11-25 NOTE — Telephone Encounter (Signed)
OK to refill

## 2011-11-27 ENCOUNTER — Encounter (HOSPITAL_COMMUNITY)
Admission: RE | Admit: 2011-11-27 | Discharge: 2011-11-27 | Disposition: A | Discharge status: Home / Self Care | Payer: Medicare Other | Source: Ambulatory Visit | Attending: Pulmonary Disease | Admitting: Pulmonary Disease

## 2011-11-27 DIAGNOSIS — Z5189 Encounter for other specified aftercare: Secondary | ICD-10-CM | POA: Diagnosis not present

## 2011-11-27 DIAGNOSIS — I2510 Atherosclerotic heart disease of native coronary artery without angina pectoris: Secondary | ICD-10-CM | POA: Diagnosis not present

## 2011-11-27 DIAGNOSIS — R0902 Hypoxemia: Secondary | ICD-10-CM | POA: Diagnosis not present

## 2011-11-27 DIAGNOSIS — Z9861 Coronary angioplasty status: Secondary | ICD-10-CM | POA: Diagnosis not present

## 2011-11-27 DIAGNOSIS — J449 Chronic obstructive pulmonary disease, unspecified: Secondary | ICD-10-CM | POA: Diagnosis not present

## 2011-11-27 DIAGNOSIS — Z9981 Dependence on supplemental oxygen: Secondary | ICD-10-CM | POA: Diagnosis not present

## 2011-11-28 ENCOUNTER — Other Ambulatory Visit: Payer: Self-pay | Admitting: Pulmonary Disease

## 2011-11-28 ENCOUNTER — Telehealth: Payer: Self-pay | Admitting: Pulmonary Disease

## 2011-11-28 MED ORDER — TIOTROPIUM BROMIDE MONOHYDRATE 18 MCG IN CAPS: 18.0000 ug | ORAL_CAPSULE | Freq: Every day | RESPIRATORY_TRACT | Status: DC

## 2011-11-28 NOTE — Telephone Encounter (Signed)
rx was printed for 30 days supply rather than going electronically for 90 days supply.  Redone.

## 2011-11-28 NOTE — Telephone Encounter (Signed)
Per pt's chart, this has already been done.  Called [facility], spoke with [facility] and informed her of this.  She stated she will inform Crystal.  Will sign off.

## 2011-11-28 NOTE — Telephone Encounter (Signed)
Addended by: Boone Master E on: 11/28/2011 03:23 PM   Modules accepted: Orders

## 2011-12-02 ENCOUNTER — Encounter (HOSPITAL_COMMUNITY)
Admission: RE | Admit: 2011-12-02 | Discharge: 2011-12-02 | Disposition: A | Discharge status: Home / Self Care | Payer: Medicare Other | Source: Ambulatory Visit | Attending: Pulmonary Disease | Admitting: Pulmonary Disease

## 2011-12-02 DIAGNOSIS — R0902 Hypoxemia: Secondary | ICD-10-CM | POA: Diagnosis not present

## 2011-12-02 DIAGNOSIS — J449 Chronic obstructive pulmonary disease, unspecified: Secondary | ICD-10-CM | POA: Diagnosis not present

## 2011-12-02 DIAGNOSIS — Z9861 Coronary angioplasty status: Secondary | ICD-10-CM | POA: Diagnosis not present

## 2011-12-02 DIAGNOSIS — Z5189 Encounter for other specified aftercare: Secondary | ICD-10-CM | POA: Diagnosis not present

## 2011-12-02 DIAGNOSIS — Z9981 Dependence on supplemental oxygen: Secondary | ICD-10-CM | POA: Diagnosis not present

## 2011-12-02 DIAGNOSIS — I2510 Atherosclerotic heart disease of native coronary artery without angina pectoris: Secondary | ICD-10-CM | POA: Diagnosis not present

## 2011-12-04 ENCOUNTER — Telehealth: Payer: Self-pay

## 2011-12-04 ENCOUNTER — Encounter (HOSPITAL_COMMUNITY)
Admission: RE | Admit: 2011-12-04 | Discharge: 2011-12-04 | Disposition: A | Discharge status: Home / Self Care | Payer: Medicare Other | Source: Ambulatory Visit | Attending: Pulmonary Disease | Admitting: Pulmonary Disease

## 2011-12-04 DIAGNOSIS — R0902 Hypoxemia: Secondary | ICD-10-CM | POA: Diagnosis not present

## 2011-12-04 DIAGNOSIS — I2510 Atherosclerotic heart disease of native coronary artery without angina pectoris: Secondary | ICD-10-CM | POA: Diagnosis not present

## 2011-12-04 DIAGNOSIS — Z5189 Encounter for other specified aftercare: Secondary | ICD-10-CM | POA: Diagnosis not present

## 2011-12-04 DIAGNOSIS — J449 Chronic obstructive pulmonary disease, unspecified: Secondary | ICD-10-CM | POA: Diagnosis not present

## 2011-12-04 DIAGNOSIS — Z9861 Coronary angioplasty status: Secondary | ICD-10-CM | POA: Diagnosis not present

## 2011-12-04 DIAGNOSIS — Z9981 Dependence on supplemental oxygen: Secondary | ICD-10-CM | POA: Diagnosis not present

## 2011-12-04 MED ORDER — OLMESARTAN MEDOXOMIL 20 MG PO TABS: 20.0000 mg | ORAL_TABLET | Freq: Every day | ORAL | Status: DC

## 2011-12-04 NOTE — Telephone Encounter (Signed)
I spoke with the pt about her elevated BP readings at Pulmonary Rehab.  Per Dr Excell Seltzer he would like the pt to increase her Benicar to 20mg  daily.  I will send in a new Rx to the pt's pharmacy. The pt will continue to have her BP monitored at rehab.

## 2011-12-08 ENCOUNTER — Encounter (INDEPENDENT_AMBULATORY_CARE_PROVIDER_SITE_OTHER): Payer: Medicare Other

## 2011-12-08 DIAGNOSIS — R0989 Other specified symptoms and signs involving the circulatory and respiratory systems: Secondary | ICD-10-CM | POA: Diagnosis not present

## 2011-12-08 DIAGNOSIS — I6529 Occlusion and stenosis of unspecified carotid artery: Secondary | ICD-10-CM

## 2011-12-09 ENCOUNTER — Telehealth: Payer: Self-pay | Admitting: Cardiovascular Disease

## 2011-12-09 ENCOUNTER — Encounter (HOSPITAL_COMMUNITY): Payer: Medicare Other

## 2011-12-09 NOTE — Telephone Encounter (Signed)
New Problem:     Patient called in wanting to know if the results for her Carotid and her blood pressure has been consistently high since he changed her medication and she would like to know how to proceed.  Please call back.

## 2011-12-10 ENCOUNTER — Other Ambulatory Visit: Payer: Self-pay | Admitting: *Deleted

## 2011-12-10 MED ORDER — AMLODIPINE BESYLATE 5 MG PO TABS: 5.0000 mg | ORAL_TABLET | Freq: Every day | ORAL | Status: DC

## 2011-12-10 MED ORDER — VALSARTAN 80 MG PO TABS: 80.0000 mg | ORAL_TABLET | Freq: Every day | ORAL | Status: DC

## 2011-12-10 NOTE — Telephone Encounter (Signed)
Yes - would go back to Diovan at previous dose. Also recommend add amlodipine 5 mg daily.

## 2011-12-10 NOTE — Telephone Encounter (Signed)
Diovan and amlodipine called into [facility] per Dr. Excell Seltzer order.  Pt notified.

## 2011-12-10 NOTE — Telephone Encounter (Signed)
States her b/p is still running 203/83 and 201/84 since increasing Benicar to 20mg .  States that prior to her Thanksgiving hospitalization she was taking Diovan 80mg  daily and her b/p was controlled.  She was admitted to the hospital where she was changed to Benicar.  Wants to know if she can go back on Diovan 80mg  daily.

## 2011-12-11 ENCOUNTER — Encounter (HOSPITAL_COMMUNITY)
Admission: RE | Admit: 2011-12-11 | Discharge: 2011-12-11 | Disposition: A | Discharge status: Home / Self Care | Payer: Medicare Other | Source: Ambulatory Visit | Attending: Pulmonary Disease | Admitting: Pulmonary Disease

## 2011-12-11 DIAGNOSIS — F411 Generalized anxiety disorder: Secondary | ICD-10-CM | POA: Diagnosis not present

## 2011-12-11 DIAGNOSIS — I6529 Occlusion and stenosis of unspecified carotid artery: Secondary | ICD-10-CM | POA: Diagnosis not present

## 2011-12-11 DIAGNOSIS — Z79899 Other long term (current) drug therapy: Secondary | ICD-10-CM | POA: Diagnosis not present

## 2011-12-11 DIAGNOSIS — J449 Chronic obstructive pulmonary disease, unspecified: Secondary | ICD-10-CM | POA: Diagnosis not present

## 2011-12-11 DIAGNOSIS — F3289 Other specified depressive episodes: Secondary | ICD-10-CM | POA: Insufficient documentation

## 2011-12-11 DIAGNOSIS — I10 Essential (primary) hypertension: Secondary | ICD-10-CM | POA: Diagnosis not present

## 2011-12-11 DIAGNOSIS — I2510 Atherosclerotic heart disease of native coronary artery without angina pectoris: Secondary | ICD-10-CM | POA: Diagnosis not present

## 2011-12-11 DIAGNOSIS — Z7902 Long term (current) use of antithrombotics/antiplatelets: Secondary | ICD-10-CM | POA: Insufficient documentation

## 2011-12-11 DIAGNOSIS — Z9981 Dependence on supplemental oxygen: Secondary | ICD-10-CM | POA: Diagnosis not present

## 2011-12-11 DIAGNOSIS — J4489 Other specified chronic obstructive pulmonary disease: Secondary | ICD-10-CM | POA: Insufficient documentation

## 2011-12-11 DIAGNOSIS — I517 Cardiomegaly: Secondary | ICD-10-CM | POA: Diagnosis not present

## 2011-12-11 DIAGNOSIS — F17200 Nicotine dependence, unspecified, uncomplicated: Secondary | ICD-10-CM | POA: Diagnosis not present

## 2011-12-11 DIAGNOSIS — Z9861 Coronary angioplasty status: Secondary | ICD-10-CM | POA: Insufficient documentation

## 2011-12-11 DIAGNOSIS — R0902 Hypoxemia: Secondary | ICD-10-CM | POA: Insufficient documentation

## 2011-12-11 DIAGNOSIS — Z7982 Long term (current) use of aspirin: Secondary | ICD-10-CM | POA: Diagnosis not present

## 2011-12-11 DIAGNOSIS — E785 Hyperlipidemia, unspecified: Secondary | ICD-10-CM | POA: Insufficient documentation

## 2011-12-11 DIAGNOSIS — I252 Old myocardial infarction: Secondary | ICD-10-CM | POA: Insufficient documentation

## 2011-12-11 DIAGNOSIS — Z5189 Encounter for other specified aftercare: Secondary | ICD-10-CM | POA: Insufficient documentation

## 2011-12-11 DIAGNOSIS — F329 Major depressive disorder, single episode, unspecified: Secondary | ICD-10-CM | POA: Insufficient documentation

## 2011-12-16 ENCOUNTER — Encounter (HOSPITAL_COMMUNITY)
Admission: RE | Admit: 2011-12-16 | Discharge: 2011-12-16 | Disposition: A | Discharge status: Home / Self Care | Payer: Medicare Other | Source: Ambulatory Visit | Attending: Pulmonary Disease | Admitting: Pulmonary Disease

## 2011-12-16 DIAGNOSIS — J449 Chronic obstructive pulmonary disease, unspecified: Secondary | ICD-10-CM | POA: Diagnosis not present

## 2011-12-16 DIAGNOSIS — Z9861 Coronary angioplasty status: Secondary | ICD-10-CM | POA: Diagnosis not present

## 2011-12-16 DIAGNOSIS — I2510 Atherosclerotic heart disease of native coronary artery without angina pectoris: Secondary | ICD-10-CM | POA: Diagnosis not present

## 2011-12-16 DIAGNOSIS — Z5189 Encounter for other specified aftercare: Secondary | ICD-10-CM | POA: Diagnosis not present

## 2011-12-16 DIAGNOSIS — Z9981 Dependence on supplemental oxygen: Secondary | ICD-10-CM | POA: Diagnosis not present

## 2011-12-16 DIAGNOSIS — R0902 Hypoxemia: Secondary | ICD-10-CM | POA: Diagnosis not present

## 2011-12-18 ENCOUNTER — Encounter (HOSPITAL_COMMUNITY)
Admission: RE | Admit: 2011-12-18 | Discharge: 2011-12-18 | Disposition: A | Discharge status: Home / Self Care | Payer: Medicare Other | Source: Ambulatory Visit | Attending: Pulmonary Disease | Admitting: Pulmonary Disease

## 2011-12-18 DIAGNOSIS — Z5189 Encounter for other specified aftercare: Secondary | ICD-10-CM | POA: Diagnosis not present

## 2011-12-18 DIAGNOSIS — Z9981 Dependence on supplemental oxygen: Secondary | ICD-10-CM | POA: Diagnosis not present

## 2011-12-18 DIAGNOSIS — I2510 Atherosclerotic heart disease of native coronary artery without angina pectoris: Secondary | ICD-10-CM | POA: Diagnosis not present

## 2011-12-18 DIAGNOSIS — Z9861 Coronary angioplasty status: Secondary | ICD-10-CM | POA: Diagnosis not present

## 2011-12-18 DIAGNOSIS — R0902 Hypoxemia: Secondary | ICD-10-CM | POA: Diagnosis not present

## 2011-12-18 DIAGNOSIS — J449 Chronic obstructive pulmonary disease, unspecified: Secondary | ICD-10-CM | POA: Diagnosis not present

## 2011-12-23 ENCOUNTER — Encounter (HOSPITAL_COMMUNITY)
Admission: RE | Admit: 2011-12-23 | Discharge: 2011-12-23 | Disposition: A | Discharge status: Home / Self Care | Payer: Medicare Other | Source: Ambulatory Visit | Attending: Pulmonary Disease | Admitting: Pulmonary Disease

## 2011-12-23 DIAGNOSIS — I2510 Atherosclerotic heart disease of native coronary artery without angina pectoris: Secondary | ICD-10-CM | POA: Diagnosis not present

## 2011-12-23 DIAGNOSIS — R0902 Hypoxemia: Secondary | ICD-10-CM | POA: Diagnosis not present

## 2011-12-23 DIAGNOSIS — J449 Chronic obstructive pulmonary disease, unspecified: Secondary | ICD-10-CM | POA: Diagnosis not present

## 2011-12-23 DIAGNOSIS — Z9861 Coronary angioplasty status: Secondary | ICD-10-CM | POA: Diagnosis not present

## 2011-12-23 DIAGNOSIS — Z5189 Encounter for other specified aftercare: Secondary | ICD-10-CM | POA: Diagnosis not present

## 2011-12-23 DIAGNOSIS — Z9981 Dependence on supplemental oxygen: Secondary | ICD-10-CM | POA: Diagnosis not present

## 2011-12-25 ENCOUNTER — Encounter (HOSPITAL_COMMUNITY)
Admission: RE | Admit: 2011-12-25 | Discharge: 2011-12-25 | Disposition: A | Discharge status: Home / Self Care | Payer: Medicare Other | Source: Ambulatory Visit | Attending: Pulmonary Disease | Admitting: Pulmonary Disease

## 2011-12-25 DIAGNOSIS — J449 Chronic obstructive pulmonary disease, unspecified: Secondary | ICD-10-CM | POA: Diagnosis not present

## 2011-12-25 DIAGNOSIS — Z9981 Dependence on supplemental oxygen: Secondary | ICD-10-CM | POA: Diagnosis not present

## 2011-12-25 DIAGNOSIS — I2510 Atherosclerotic heart disease of native coronary artery without angina pectoris: Secondary | ICD-10-CM | POA: Diagnosis not present

## 2011-12-25 DIAGNOSIS — Z9861 Coronary angioplasty status: Secondary | ICD-10-CM | POA: Diagnosis not present

## 2011-12-25 DIAGNOSIS — Z5189 Encounter for other specified aftercare: Secondary | ICD-10-CM | POA: Diagnosis not present

## 2011-12-25 DIAGNOSIS — R0902 Hypoxemia: Secondary | ICD-10-CM | POA: Diagnosis not present

## 2011-12-26 ENCOUNTER — Encounter: Payer: Self-pay | Admitting: Pulmonary Disease

## 2011-12-26 ENCOUNTER — Ambulatory Visit (INDEPENDENT_AMBULATORY_CARE_PROVIDER_SITE_OTHER): Payer: Medicare Other | Admitting: Pulmonary Disease

## 2011-12-26 VITALS — BP 124/72 | HR 60 | Temp 97.9°F | Ht 62.0 in | Wt 130.0 lb

## 2011-12-26 DIAGNOSIS — J4489 Other specified chronic obstructive pulmonary disease: Secondary | ICD-10-CM

## 2011-12-26 DIAGNOSIS — J449 Chronic obstructive pulmonary disease, unspecified: Secondary | ICD-10-CM

## 2011-12-26 NOTE — Patient Instructions (Signed)
Trial of nicotine patch daily If this does not help, we can try wellbutrin You have to quit smoking Alternatives to spiriva include albuterol/ atrovent nebuliser medications

## 2011-12-26 NOTE — Progress Notes (Signed)
  Subjective:    Patient ID: Patricia [name], female    DOB: [birth year], 71 y.o.   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 Noted to have persistent hypoxemia. V/Q revealed no significant perfusion defects. CXR showed hyperinflation. She was started on Advair and Spiriva. Discharged on home O2 due to exertional hypoxia.    08/29/2011 PFTs >> ratio 56, FEV1 92%, small airways 26%, no BD response, some air trapping, DLCO 66% C/w emphysema Did not desaturate with exertion -O2 & advair d/c 'd   12/26/2011 Smoking 2-5 cigs/ day Just completed 3 mnths of chantix Completed 13 sessions of rehab, Increased from 2.8 mets to 3.9  Enjoying it Spiriva will be tier 3 on her new insurance, incresaed copay   Review of Systems  Patient denies significant dyspnea,cough, hemoptysis,  chest pain, palpitations, pedal edema, orthopnea, paroxysmal nocturnal dyspnea, lightheadedness, nausea, vomiting, abdominal or  leg pains      Objective:   Physical Exam  Gen. Pleasant, well-nourished, in no distress ENT - no lesions, no post nasal drip Neck: No JVD, no thyromegaly, no carotid bruits Lungs: no use of accessory muscles, no dullness to percussion, clear without rales or rhonchi  Cardiovascular: Rhythm regular, heart sounds  normal, no murmurs or gallops, no peripheral edema Musculoskeletal: No deformities, no cyanosis or clubbing         Assessment & Plan:

## 2011-12-29 NOTE — Assessment & Plan Note (Signed)
Smoking cessation is key here Trial of nicotine patch daily If this does not help, we can try wellbutrin You have to quit smoking Alternatives to spiriva include albuterol/ atrovent nebuliser medications - Ok to stop spiriva in my opinion if hse is able to quit.

## 2011-12-30 ENCOUNTER — Encounter (HOSPITAL_COMMUNITY)
Admission: RE | Admit: 2011-12-30 | Discharge: 2011-12-30 | Disposition: A | Discharge status: Home / Self Care | Payer: Medicare Other | Source: Ambulatory Visit | Attending: Pulmonary Disease | Admitting: Pulmonary Disease

## 2011-12-30 DIAGNOSIS — R0902 Hypoxemia: Secondary | ICD-10-CM | POA: Diagnosis not present

## 2011-12-30 DIAGNOSIS — Z9861 Coronary angioplasty status: Secondary | ICD-10-CM | POA: Diagnosis not present

## 2011-12-30 DIAGNOSIS — J449 Chronic obstructive pulmonary disease, unspecified: Secondary | ICD-10-CM | POA: Diagnosis not present

## 2011-12-30 DIAGNOSIS — I2510 Atherosclerotic heart disease of native coronary artery without angina pectoris: Secondary | ICD-10-CM | POA: Diagnosis not present

## 2011-12-30 DIAGNOSIS — Z9981 Dependence on supplemental oxygen: Secondary | ICD-10-CM | POA: Diagnosis not present

## 2011-12-30 DIAGNOSIS — Z5189 Encounter for other specified aftercare: Secondary | ICD-10-CM | POA: Diagnosis not present

## 2012-01-01 ENCOUNTER — Encounter (HOSPITAL_COMMUNITY)
Admission: RE | Admit: 2012-01-01 | Discharge: 2012-01-01 | Disposition: A | Discharge status: Home / Self Care | Payer: Medicare Other | Source: Ambulatory Visit | Attending: Pulmonary Disease | Admitting: Pulmonary Disease

## 2012-01-01 DIAGNOSIS — Z5189 Encounter for other specified aftercare: Secondary | ICD-10-CM | POA: Diagnosis not present

## 2012-01-01 DIAGNOSIS — Z9861 Coronary angioplasty status: Secondary | ICD-10-CM | POA: Diagnosis not present

## 2012-01-01 DIAGNOSIS — Z9981 Dependence on supplemental oxygen: Secondary | ICD-10-CM | POA: Diagnosis not present

## 2012-01-01 DIAGNOSIS — I2510 Atherosclerotic heart disease of native coronary artery without angina pectoris: Secondary | ICD-10-CM | POA: Diagnosis not present

## 2012-01-01 DIAGNOSIS — J449 Chronic obstructive pulmonary disease, unspecified: Secondary | ICD-10-CM | POA: Diagnosis not present

## 2012-01-01 DIAGNOSIS — R0902 Hypoxemia: Secondary | ICD-10-CM | POA: Diagnosis not present

## 2012-01-01 NOTE — Progress Notes (Signed)
Ms. Ciaravino came to Pulmonary Rehab today, noted BP up some today.  Discussed smoking cessation.  Will continue to encourage and support.

## 2012-01-06 ENCOUNTER — Other Ambulatory Visit: Payer: Self-pay | Admitting: Cardiovascular Disease

## 2012-01-06 ENCOUNTER — Encounter (HOSPITAL_COMMUNITY): Payer: Medicare Other

## 2012-01-08 ENCOUNTER — Encounter (HOSPITAL_COMMUNITY): Payer: Medicare Other

## 2012-01-09 ENCOUNTER — Other Ambulatory Visit: Payer: Self-pay | Admitting: *Deleted

## 2012-01-09 ENCOUNTER — Telehealth: Payer: Self-pay | Admitting: Internal Medicine

## 2012-01-09 MED ORDER — CLONAZEPAM 0.5 MG PO TABS: 0.5000 mg | ORAL_TABLET | Freq: Two times a day (BID) | ORAL | Status: DC | PRN

## 2012-01-09 MED ORDER — ESCITALOPRAM OXALATE 5 MG PO TABS: 5.0000 mg | ORAL_TABLET | Freq: Every day | ORAL | Status: DC

## 2012-01-09 NOTE — Telephone Encounter (Signed)
See previous phone note.  

## 2012-01-09 NOTE — Telephone Encounter (Signed)
Discuss with patient previous message and Rx sent to pharmacy.

## 2012-01-09 NOTE — Telephone Encounter (Signed)
Last OV 08-08-11, last filled #30

## 2012-01-09 NOTE — Telephone Encounter (Signed)
Patient called and states she is going thru an extremely hard time right now as her sister is in the care of Hospice. She is either requesting something be prescribed for her anxiety/depression or would like to know if the Clonazepam her Cardiologist prescribed her would be sufficient. Please call patient back at [phone]

## 2012-01-09 NOTE — Telephone Encounter (Signed)
Discussed with pt, sent in lexapro 5mg .

## 2012-01-09 NOTE — Telephone Encounter (Signed)
I rec the following: Lexapro 5 mg one by mouth daily #30 no refills Increase clonazepam 0.5 mg to  twice a day, #60 no refills Office visit in 2 or 3 weeks Call if side effects, needs office visit sooner  if symptoms severe or she has suicidal thoughts.

## 2012-01-09 NOTE — Telephone Encounter (Signed)
Please advise 

## 2012-01-13 ENCOUNTER — Encounter (HOSPITAL_COMMUNITY)
Admission: RE | Admit: 2012-01-13 | Discharge: 2012-01-13 | Disposition: A | Discharge status: Home / Self Care | Payer: Medicare Other | Source: Ambulatory Visit | Attending: Pulmonary Disease | Admitting: Pulmonary Disease

## 2012-01-13 DIAGNOSIS — Z5189 Encounter for other specified aftercare: Secondary | ICD-10-CM | POA: Insufficient documentation

## 2012-01-13 DIAGNOSIS — I252 Old myocardial infarction: Secondary | ICD-10-CM | POA: Insufficient documentation

## 2012-01-13 DIAGNOSIS — F17200 Nicotine dependence, unspecified, uncomplicated: Secondary | ICD-10-CM | POA: Diagnosis not present

## 2012-01-13 DIAGNOSIS — I6529 Occlusion and stenosis of unspecified carotid artery: Secondary | ICD-10-CM | POA: Diagnosis not present

## 2012-01-13 DIAGNOSIS — Z7902 Long term (current) use of antithrombotics/antiplatelets: Secondary | ICD-10-CM | POA: Diagnosis not present

## 2012-01-13 DIAGNOSIS — E785 Hyperlipidemia, unspecified: Secondary | ICD-10-CM | POA: Insufficient documentation

## 2012-01-13 DIAGNOSIS — F3289 Other specified depressive episodes: Secondary | ICD-10-CM | POA: Insufficient documentation

## 2012-01-13 DIAGNOSIS — I10 Essential (primary) hypertension: Secondary | ICD-10-CM | POA: Diagnosis not present

## 2012-01-13 DIAGNOSIS — Z7982 Long term (current) use of aspirin: Secondary | ICD-10-CM | POA: Diagnosis not present

## 2012-01-13 DIAGNOSIS — J4489 Other specified chronic obstructive pulmonary disease: Secondary | ICD-10-CM | POA: Insufficient documentation

## 2012-01-13 DIAGNOSIS — F411 Generalized anxiety disorder: Secondary | ICD-10-CM | POA: Diagnosis not present

## 2012-01-13 DIAGNOSIS — Z79899 Other long term (current) drug therapy: Secondary | ICD-10-CM | POA: Insufficient documentation

## 2012-01-13 DIAGNOSIS — R0902 Hypoxemia: Secondary | ICD-10-CM | POA: Insufficient documentation

## 2012-01-13 DIAGNOSIS — I517 Cardiomegaly: Secondary | ICD-10-CM | POA: Diagnosis not present

## 2012-01-13 DIAGNOSIS — Z9981 Dependence on supplemental oxygen: Secondary | ICD-10-CM | POA: Insufficient documentation

## 2012-01-13 DIAGNOSIS — I2510 Atherosclerotic heart disease of native coronary artery without angina pectoris: Secondary | ICD-10-CM | POA: Insufficient documentation

## 2012-01-13 DIAGNOSIS — J449 Chronic obstructive pulmonary disease, unspecified: Secondary | ICD-10-CM | POA: Diagnosis not present

## 2012-01-13 DIAGNOSIS — Z9861 Coronary angioplasty status: Secondary | ICD-10-CM | POA: Insufficient documentation

## 2012-01-13 DIAGNOSIS — F329 Major depressive disorder, single episode, unspecified: Secondary | ICD-10-CM | POA: Diagnosis not present

## 2012-01-15 ENCOUNTER — Encounter (HOSPITAL_COMMUNITY): Payer: Medicare Other

## 2012-01-15 DIAGNOSIS — M25569 Pain in unspecified knee: Secondary | ICD-10-CM | POA: Diagnosis not present

## 2012-01-15 DIAGNOSIS — M5137 Other intervertebral disc degeneration, lumbosacral region: Secondary | ICD-10-CM | POA: Diagnosis not present

## 2012-01-15 DIAGNOSIS — M159 Polyosteoarthritis, unspecified: Secondary | ICD-10-CM | POA: Diagnosis not present

## 2012-01-20 ENCOUNTER — Encounter (HOSPITAL_COMMUNITY)
Admission: RE | Admit: 2012-01-20 | Discharge: 2012-01-20 | Disposition: A | Discharge status: Home / Self Care | Payer: Medicare Other | Source: Ambulatory Visit | Attending: Pulmonary Disease | Admitting: Pulmonary Disease

## 2012-01-22 ENCOUNTER — Encounter (HOSPITAL_COMMUNITY)
Admission: RE | Admit: 2012-01-22 | Discharge: 2012-01-22 | Disposition: A | Discharge status: Home / Self Care | Payer: Medicare Other | Source: Ambulatory Visit | Attending: Pulmonary Disease | Admitting: Pulmonary Disease

## 2012-01-22 NOTE — Progress Notes (Signed)
Pulmonary Rehabilitation Program Outcomes Report   Orientation:  10/15/11 Graduate Date: 01/22/12 Discharge Date: 01/22/2012 # of sessions completed: 18/24  Pulmonologist: Vassie Loll Class Time:  1:30pm  A.  Exercise Program:  Tolerates exercise @ 2.5 METS for 45 minutes, Walk Test Results:  Pre: 1049ft and Post: 1043ft, Improved functional capacity  9.00 %, Improved  muscular strength  13.04 %, Improved dyspnea score 52.63 %, Improved education score 12.50 %, Exercise limited by dyspnea, Needs encouragement on exercise program and Discharged to home exercise program.  Anticipated compliance:  fair  B.  Mental Health:  Health related anxiety and Quality of Life (QOL)  changes:  Overall  +4.28 %, Health/Functioning +9.39 %, Socioeconomics -5.53 %, Psych/Spiritual +4.93 %, Family 0 %    C.  Education/Instruction/Skills  Uses Perceived Exertion Scale and/or Dyspnea Scale and Attended 10 education classes  Demonstrates accurate diaphragmatic breathing and pursed lip breathing. Home Exercise Given and doing well with.   D. Pulmonary Rehab RD Discharge Note There are some ways pt can change her diet to become healthier for her lung disease.  Pt wt maintained during rehab.  Wt loss not desired at this time due to tobacco cessation. Section Completed by: Mickle Plumb, M.Ed, RD, LDN, CDE    E.  Blood Lipids   Lab Results  Component Value Date   CHOL 141 07/03/2011   HDL 71 07/03/2011   LDLCALC 59 07/03/2011   LDLDIRECT 156.0 04/26/2007   TRIG 55 07/03/2011   CHOLHDL 2.0 07/03/2011    F.  Lifestyle Changes:  Continues to smoke and Needs physician encouragement on making lifestyle changes  G.  Symptoms noted with exercise:  Shortness of breath, Fatigue and Exertional hypertension  Comments:  Patient has graduated program early to attend physical therapy for her back. Patient was able to complete most visits and did well. Patient increased workloads as she could  tolerate and very compliant with attendance and home exercises. Patient vitals were stable throughout the program. No patient complaints and all goals were met. Patient stated "she was satisfied with the outcome and is doing much better". Patient plans to call RN or I to return to the pulmonary maintenance program once PT is over. Great to work with.   Report completed by: Toni Amend L. Manson Passey, MS, NASM, CES     Patricia [name] did well with exercise.  She was not able to complete smoking cessation, however she assures [place] she will continue to try.  Patricia Olden RN

## 2012-01-23 DIAGNOSIS — M5137 Other intervertebral disc degeneration, lumbosacral region: Secondary | ICD-10-CM | POA: Diagnosis not present

## 2012-01-27 ENCOUNTER — Encounter (HOSPITAL_COMMUNITY): Payer: Medicare Other

## 2012-01-28 DIAGNOSIS — M5137 Other intervertebral disc degeneration, lumbosacral region: Secondary | ICD-10-CM | POA: Diagnosis not present

## 2012-01-29 ENCOUNTER — Encounter (HOSPITAL_COMMUNITY): Payer: Medicare Other

## 2012-01-30 DIAGNOSIS — M5137 Other intervertebral disc degeneration, lumbosacral region: Secondary | ICD-10-CM | POA: Diagnosis not present

## 2012-02-03 ENCOUNTER — Encounter (HOSPITAL_COMMUNITY): Payer: Medicare Other

## 2012-02-03 DIAGNOSIS — M5137 Other intervertebral disc degeneration, lumbosacral region: Secondary | ICD-10-CM | POA: Diagnosis not present

## 2012-02-05 DIAGNOSIS — M5137 Other intervertebral disc degeneration, lumbosacral region: Secondary | ICD-10-CM | POA: Diagnosis not present

## 2012-02-06 ENCOUNTER — Encounter: Payer: Self-pay | Admitting: Internal Medicine

## 2012-02-06 ENCOUNTER — Ambulatory Visit (INDEPENDENT_AMBULATORY_CARE_PROVIDER_SITE_OTHER): Payer: Medicare Other | Admitting: Internal Medicine

## 2012-02-06 VITALS — BP 152/78 | HR 68 | Temp 98.2°F | Resp 16 | Wt 127.2 lb

## 2012-02-06 DIAGNOSIS — R0902 Hypoxemia: Secondary | ICD-10-CM

## 2012-02-06 DIAGNOSIS — F17200 Nicotine dependence, unspecified, uncomplicated: Secondary | ICD-10-CM | POA: Diagnosis not present

## 2012-02-06 DIAGNOSIS — I6529 Occlusion and stenosis of unspecified carotid artery: Secondary | ICD-10-CM

## 2012-02-06 DIAGNOSIS — F341 Dysthymic disorder: Secondary | ICD-10-CM | POA: Diagnosis not present

## 2012-02-06 DIAGNOSIS — I10 Essential (primary) hypertension: Secondary | ICD-10-CM

## 2012-02-06 DIAGNOSIS — J449 Chronic obstructive pulmonary disease, unspecified: Secondary | ICD-10-CM

## 2012-02-06 DIAGNOSIS — F419 Anxiety disorder, unspecified: Secondary | ICD-10-CM

## 2012-02-06 LAB — BASIC METABOLIC PANEL
CO2: 28 mEq/L (ref 19–32)
Chloride: 102 mEq/L (ref 96–112)
Glucose, Bld: 60 mg/dL — ABNORMAL LOW (ref 70–99)
Sodium: 139 mEq/L (ref 135–145)

## 2012-02-06 MED ORDER — ESCITALOPRAM OXALATE 5 MG PO TABS: 5.0000 mg | ORAL_TABLET | Freq: Every day | ORAL | Status: DC

## 2012-02-06 NOTE — Assessment & Plan Note (Signed)
asx

## 2012-02-06 NOTE — Assessment & Plan Note (Addendum)
O2 sat was frequently checked @ rehab, usually 96%. O2 sat today 96%, she also check at home and is around 96, 97.

## 2012-02-06 NOTE — Assessment & Plan Note (Signed)
Still smokes, counseled

## 2012-02-06 NOTE — Progress Notes (Signed)
  Subjective:    Patient ID: Patricia [name], female    DOB: [birth year], 71 y.o.   MRN: [id number]  HPI Followup Few weeks ago she called and requested something for anxiety, her sister is in poor health and she is quite worried about her. She started Lexapro 5 mg one by mouth daily, good compliance and tolerance, it has definitely helped. No apparent side effects, see review of systems. She was also recommended to increase clonazepam to 1 tablet twice a day but usually that make her very sleepy so she's is taking to one tablet daily. Hypertension, at pulmonary rehabilitation her BP was noted to be elevated, cardiology added Norvasc. Since then her blood pressure is better. High cholesterol, good medication compliance Tobacco, still smoking. Does not think right now is the right time to quit given her stress related to her sister.  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    stroke --no colon ca--no breast cancer --no     Social History: retired from office work since 2003.   Widow, 1 child , son, commited suicide 2010 Gson lives w/ her   tobbacco--still smoking  alcohol-- socially    Review of Systems Note nausea, vomiting, diarrhea.  Sleeps well. No suicidal thoughts. No chest pain, shortness of breath minimal, essentially at baseline. Denies cough, wheezing or hemoptysis. Has not needed albuterol. Has a  history of hypoxia, check her O2 sat frequently. See assessment and plan.     Objective:   Physical Exam  General -- alert, well-developed, and well-nourished.   Neck --no thyromegaly Lungs -- normal respiratory effort, no intercostal retractions, no accessory muscle use, decreased breath sounds  Heart-- normal rate, regular rhythm, no murmur, and no gallop.   Extremities-- no pretibial edema bilaterally Neurologic-- alert & oriented X3 and strength normal in all extremities. Psych-- Cognition and judgment appear intact. Alert and cooperative with normal attention span and concentration.  not anxious appearing and not depressed appearing.       Assessment & Plan:

## 2012-02-06 NOTE — Assessment & Plan Note (Addendum)
Recently he developed anxiety> depression. She was started on Lexapro, good response, states she does not need to up Lexapro dose but will call if necessary. Plan: Continue with present care Clonazepam is written as twice a day but she usually takes only one tablet daily due to excessive somnolence.

## 2012-02-06 NOTE — Assessment & Plan Note (Signed)
BP was slightly elevated a few weeks ago, amlodipine was added. BP today 152/78. Recent ambulatory blood pressures within normal.  Plan: BMP, no change

## 2012-02-06 NOTE — Assessment & Plan Note (Signed)
Status post rehabilitation, it helped, good compliance with spiriva, does not need albuterol at this point

## 2012-02-07 ENCOUNTER — Encounter: Payer: Self-pay | Admitting: Internal Medicine

## 2012-02-10 DIAGNOSIS — M545 Low back pain: Secondary | ICD-10-CM | POA: Diagnosis not present

## 2012-02-11 ENCOUNTER — Encounter: Payer: Self-pay | Admitting: *Deleted

## 2012-03-01 ENCOUNTER — Ambulatory Visit (INDEPENDENT_AMBULATORY_CARE_PROVIDER_SITE_OTHER): Payer: Medicare Other | Admitting: Cardiovascular Disease

## 2012-03-01 ENCOUNTER — Encounter: Payer: Self-pay | Admitting: Cardiovascular Disease

## 2012-03-01 VITALS — BP 152/61 | HR 52 | Ht 62.0 in | Wt 128.0 lb

## 2012-03-01 DIAGNOSIS — I2510 Atherosclerotic heart disease of native coronary artery without angina pectoris: Secondary | ICD-10-CM

## 2012-03-01 DIAGNOSIS — I10 Essential (primary) hypertension: Secondary | ICD-10-CM

## 2012-03-01 DIAGNOSIS — I739 Peripheral vascular disease, unspecified: Secondary | ICD-10-CM

## 2012-03-01 MED ORDER — LOSARTAN POTASSIUM-HCTZ 100-12.5 MG PO TABS: 1.0000 | ORAL_TABLET | Freq: Every day | ORAL | Status: DC

## 2012-03-01 NOTE — Patient Instructions (Addendum)
Your physician has recommended you make the following change in your medication: STOP Valsartan (Diovan), START Losartan/HCT 100/12.5mg  take one by mouth daily  Your physician wants you to follow-up in: 6 MONTHS with Dr Excell Seltzer.  You will receive a reminder letter in the mail two months in advance. If you don't receive a letter, please call our office to schedule the follow-up appointment.

## 2012-03-07 NOTE — Assessment & Plan Note (Signed)
Significant PAD has been noted. However, the patient remains asymptomatic and has no limitation related to leg pain. Will continue to follow clinically. Again she is on antiplatelet therapy and smoking cessation has been advised.

## 2012-03-07 NOTE — Assessment & Plan Note (Signed)
Stable without angina. She remains on aspirin and Plavix. She should continue these. We discussed the importance of tobacco cessation she understands this. She is on simvastatin for treatment of her hyperlipidemia. I will see her back in 6 months.

## 2012-03-07 NOTE — Progress Notes (Signed)
HPI:  71 year old woman presenting for followup evaluation. She is followed for coronary and peripheral arterial disease. 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 She unfortunately continues to smoke.  From a symptomatic perspective she is doing fairly well. She's had no recent cardiac symptoms. She denies chest pain or pressure. She's had no leg pain with ambulation. She does have chronic exertional dyspnea this is unchanged. She denies palpitations, lightheadedness, or syncope  Outpatient Encounter Prescriptions as of 03/01/2012  Medication Sig Dispense Refill  . albuterol (PROVENTIL,VENTOLIN) 90 MCG/ACT inhaler Inhale 2 puffs into the lungs every 4 (four) hours as needed for wheezing. Follow up with your pulmonary doctor for this medicine.  17 g  1  . amLODipine (NORVASC) 5 MG tablet Take 1 tablet by mouth Daily.      Marland Kitchen aspirin 81 MG tablet Take 1 tablet (81 mg total) by mouth daily.      . Cholecalciferol (VITAMIN D3) 1000 UNITS CAPS Take 1,000 Units by mouth daily.      . clonazePAM (KLONOPIN) 0.5 MG tablet Take 1 tablet (0.5 mg total) by mouth 2 (two) times daily as needed.  60 tablet  0  . clopidogrel (PLAVIX) 75 MG tablet Take 1 tablet (75 mg total) by mouth daily.  90 tablet  3  . diphenhydrAMINE (SIMPLY SLEEP) 25 MG tablet Take 1 tablet (25 mg total) by mouth at bedtime as needed. For sleep. Do not take this and clonazepam together.  30 tablet    . escitalopram (LEXAPRO) 5 MG tablet Take 1 tablet (5 mg total) by mouth daily.  90 tablet  1  . loratadine (CLARITIN) 10 MG tablet Take 10 mg by mouth daily as needed. For allergies      . methocarbamol (ROBAXIN) 500 MG tablet Take 500 mg by mouth every 6 (six) hours as needed.      . nitroGLYCERIN (NITROSTAT) 0.4 MG SL tablet Place 0.4 mg under the tongue every 5 (five) minutes as needed.      . simvastatin (ZOCOR) 20 MG tablet TAKE 1  TABLET BY MOUTH AT BEDTIME  90 tablet  2  . tiotropium (SPIRIVA) 18 MCG inhalation capsule Place 1 capsule (18 mcg total) into inhaler and inhale daily.  90 capsule  3  . traMADol (ULTRAM) 50 MG tablet Take 50 mg by mouth every 6 (six) hours as needed.      Marland Kitchen DISCONTD: valsartan (DIOVAN) 80 MG tablet Take 1 tablet (80 mg total) by mouth daily.  30 tablet  11  . losartan-hydrochlorothiazide (HYZAAR) 100-12.5 MG per tablet Take 1 tablet by mouth daily.  90 tablet  3    No Known Allergies  Past Medical History  Diagnosis Date  . Osteopenia   . COPD (chronic obstructive pulmonary disease)   . Hyperlipidemia   . Thyroid nodule   . Abnormal LFTs   . Depression   . HTN (hypertension)     intolerant to ACEi (cough)  . Allergic rhinitis   . Anxiety   . Panic attacks   . PAD (peripheral artery disease)     At [facility] 11/12:  critical stenosis noted just below the sheath site leading into a totally occluded SFA and a patent deep femoral artery  . Carotid artery disease     moderate; asymptomatic  . CAD (coronary artery disease)     s/p DES to the circumflex 12/11;  LHC 07/02/11: pLAD  30%, pCFX stent with 95% ISR involving origin of OM1 with 90-95% prox stenosis, oRCA 95% - vessel is sub totally occluded and fills from LCA collats; EF 55-65%.  PCI:  Complex PCI (POBA) of the CFX/OM bifurcation with IVUS guidance;  Echocardiogram 07/04/11: Mild LVH, EF 60-65%    ROS: Negative except as per HPI  BP 152/61  Pulse 52  Ht 5\' 2"  (1.575 m)  Wt 58.06 kg (128 lb)  BMI 23.41 kg/m2  PHYSICAL EXAM: Pt is alert and oriented, NAD HEENT: normal Neck: JVP - normal, carotids 2+= with bilateral bruits Lungs: CTA bilaterally CV: RRR without murmur or gallop Abd: soft, NT, Positive BS, no hepatomegaly Ext: no C/C/E Skin: warm/dry no rash  EKG:  Normal sinus rhythm 65 beats per minute, mild QT prolongation with a QTC of 470 ms. Nonspecific ST abnormality   ASSESSMENT AND PLAN:

## 2012-03-07 NOTE — Assessment & Plan Note (Signed)
She notes that her co-pay for Diovan has become quite high. She would like to change medications. As her blood pressure control is suboptimal, I have recommended changing to losartan/hydrochlorothiazide 100/12.5 mg daily.

## 2012-03-23 ENCOUNTER — Telehealth: Payer: Self-pay | Admitting: Internal Medicine

## 2012-03-23 MED ORDER — CLONAZEPAM 0.5 MG PO TABS: 0.5000 mg | ORAL_TABLET | Freq: Two times a day (BID) | ORAL | Status: DC | PRN

## 2012-03-23 NOTE — Telephone Encounter (Signed)
Faxed.   KP 

## 2012-03-23 NOTE — Telephone Encounter (Signed)
Last seen 02/06/12 and filled 01/09/12 #60. Please advise    KP

## 2012-03-23 NOTE — Telephone Encounter (Signed)
Ok to refill x 1  

## 2012-03-23 NOTE — Telephone Encounter (Signed)
Refill: Clonazepam #60.  1 bid prn. Last fill 5.31.13

## 2012-04-07 ENCOUNTER — Telehealth: Payer: Self-pay | Admitting: Cardiovascular Disease

## 2012-04-07 NOTE — Telephone Encounter (Signed)
Pt calling wanting to touch base with dr cooper, has had two stents placed , has not been feeling well the last few days, some SOB on excertion at times, pls call [phone]

## 2012-04-07 NOTE — Telephone Encounter (Signed)
Pt calls today b/c she sometimes just doesn't feel good and has taken NTG "maybe once a week just to see" Is not definitive whether this really helped or not as it occurred at rest in bed. She states she is not sure if it is chest pain but the possiblity it might be. This occurs occasionally while at rest in bed at night. Not every night She denies heavy tightening in her chest symptoms as before prior to her stents.  She is still smoking about 10 cigarettes/day. She has not been able to quit.  Pt wanted to touch base with Dr. Excell Seltzer and let him know.  She is painfree at this time.  Mylo Red RN

## 2012-04-20 ENCOUNTER — Other Ambulatory Visit: Payer: Self-pay | Admitting: Cardiology

## 2012-04-20 ENCOUNTER — Other Ambulatory Visit: Payer: Self-pay | Admitting: Internal Medicine

## 2012-04-20 MED ORDER — CLOPIDOGREL BISULFATE 75 MG PO TABS: 75.0000 mg | ORAL_TABLET | Freq: Every day | ORAL | Status: DC

## 2012-04-20 MED ORDER — SIMVASTATIN 20 MG PO TABS: 20.0000 mg | ORAL_TABLET | Freq: Every day | ORAL | Status: DC

## 2012-04-20 NOTE — Telephone Encounter (Signed)
zocor 20 mg 90 qty Last refill 02/06/12 Take one tablet by mouth every day

## 2012-04-20 NOTE — Telephone Encounter (Signed)
Refill done.  

## 2012-04-30 ENCOUNTER — Ambulatory Visit (INDEPENDENT_AMBULATORY_CARE_PROVIDER_SITE_OTHER): Payer: Medicare Other | Admitting: Adult Health

## 2012-04-30 ENCOUNTER — Encounter: Payer: Self-pay | Admitting: Adult Health

## 2012-04-30 VITALS — BP 132/66 | HR 65 | Temp 97.6°F | Ht 62.0 in | Wt 132.0 lb

## 2012-04-30 DIAGNOSIS — Z23 Encounter for immunization: Secondary | ICD-10-CM | POA: Diagnosis not present

## 2012-04-30 DIAGNOSIS — J449 Chronic obstructive pulmonary disease, unspecified: Secondary | ICD-10-CM | POA: Diagnosis not present

## 2012-04-30 NOTE — Progress Notes (Signed)
  Subjective:    Patient ID: Patricia [name], female    DOB: 04/08/1941, 71 y.o.   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 Noted to have persistent hypoxemia. V/Q revealed no significant perfusion defects. CXR showed hyperinflation. She was started on Advair and Spiriva. Discharged on home O2 due to exertional hypoxia.    08/29/2011 PFTs >> ratio 56, FEV1 92%, small airways 26%, no BD response, some air trapping, DLCO 66% C/w emphysema Did not desaturate with exertion -O2 & advair d/c 'd  12/26/2011 Smoking 2-5 cigs/ day Just completed 3 mnths of chantix Completed 13 sessions of rehab, Increased from 2.8 mets to 3.9  Enjoying it Spiriva will be tier 3 on her new insurance, incresaed copay >>no changes    04/30/2012 Follow up  Patient returns for a four-month followup for COPD. Since last visit, she is doing well. .  No increased cough or wheezing. Dyspnea at baseline  She continues to smoke, we discussed smoking cessation. No weight loss, hemoptysis or edema.    Review of Systems Constitutional:   No  weight loss, night sweats,  Fevers, chills,  +fatigue, or  lassitude.  HEENT:   No headaches,  Difficulty swallowing,  Tooth/dental problems, or  Sore throat,                No sneezing, itching, ear ache, nasal congestion, post nasal drip,   CV:  No chest pain,  Orthopnea, PND, swelling in lower extremities, anasarca, dizziness, palpitations, syncope.   GI  No heartburn, indigestion, abdominal pain, nausea, vomiting, diarrhea, change in bowel habits, loss of appetite, bloody stools.   Resp: No coughing up of blood.  No change in color of mucus.  No wheezing.  No chest wall deformity  Skin: no rash or lesions.  GU: no dysuria, change in color of urine, no urgency or frequency.  No flank pain, no hematuria   MS:  No joint pain or swelling.  No decreased range of  motion.  No back pain.  Psych:  No change in mood or affect. No depression or anxiety.  No memory loss.           Objective:   Physical Exam  GEN: A/Ox3; pleasant , NAD  HEENT:  /AT,  EACs-clear, TMs-wnl, NOSE-clear, THROAT-clear, no lesions, no postnasal drip or exudate noted.   NECK:  Supple w/ fair ROM; no JVD; normal carotid impulses w/o bruits; no thyromegaly or nodules palpated; no lymphadenopathy.  RESP  Coarse BS w/o, wheezes/ rales/ or rhonchi.no accessory muscle use, no dullness to percussion  CARD:  RRR, no m/r/g  , no peripheral edema, pulses intact, no cyanosis or clubbing.  GI:   Soft & nt; nml bowel sounds; no organomegaly or masses detected.  Musco: Warm bil, no deformities or joint swelling noted.   Neuro: alert, no focal deficits noted.    Skin: Warm, no lesions or rashes        Assessment & Plan:

## 2012-04-30 NOTE — Assessment & Plan Note (Signed)
Compensated on present regimen. Patient is encouraged on smoking cessation. Flu shot today. Followup in 4 months and as needed

## 2012-04-30 NOTE — Patient Instructions (Addendum)
Flu shot today. Continue on Spiriva once daily. Continue to work on stopping smoking. Please call us back if you change your mind on prescriptions for Wellbutrin or other smoking cessation options. Follow with Dr. Vassie Loll in 4 months and as needed

## 2012-05-25 ENCOUNTER — Encounter: Payer: Self-pay | Admitting: Internal Medicine

## 2012-05-26 ENCOUNTER — Other Ambulatory Visit: Payer: Self-pay | Admitting: Internal Medicine

## 2012-05-26 MED ORDER — CLONAZEPAM 0.5 MG PO TABS: 0.5000 mg | ORAL_TABLET | Freq: Two times a day (BID) | ORAL | Status: DC | PRN

## 2012-07-01 ENCOUNTER — Telehealth: Payer: Self-pay | Admitting: Cardiovascular Disease

## 2012-07-01 NOTE — Telephone Encounter (Signed)
New Problem:    Patient called in wanting to be seen by Dr. Excell Seltzer because she is experiencing the same symptoms of the angina she had last year when she was rushed to the hospital.  Please call back.

## 2012-07-01 NOTE — Telephone Encounter (Signed)
I spoke with the pt and she complains of symptoms that are similar to what led to her coronary intervention a year ago.  The pt's symptoms have been on going for a few months and progressively worsening. The pt complains of chest heaviness every morning when she awakens, "a little" SOB with exertion and decreased energy.  The pt has been using NTG with relief. I scheduled the pt to come into the office on 07/02/12 at 1:45 for evaluation with Dr Excell Seltzer.

## 2012-07-02 ENCOUNTER — Encounter: Payer: Self-pay | Admitting: Cardiovascular Disease

## 2012-07-02 ENCOUNTER — Ambulatory Visit (INDEPENDENT_AMBULATORY_CARE_PROVIDER_SITE_OTHER): Payer: Medicare Other | Admitting: Cardiovascular Disease

## 2012-07-02 VITALS — BP 132/60 | HR 68 | Ht 62.0 in | Wt 132.0 lb

## 2012-07-02 DIAGNOSIS — R072 Precordial pain: Secondary | ICD-10-CM | POA: Diagnosis not present

## 2012-07-02 DIAGNOSIS — I2510 Atherosclerotic heart disease of native coronary artery without angina pectoris: Secondary | ICD-10-CM

## 2012-07-02 LAB — BASIC METABOLIC PANEL
CO2: 30 mEq/L (ref 19–32)
Calcium: 9.3 mg/dL (ref 8.4–10.5)
GFR: 112.99 mL/min (ref >60.00)
Sodium: 138 mEq/L (ref 135–145)

## 2012-07-02 LAB — PROTIME-INR: INR: 1.1 ratio — ABNORMAL HIGH (ref 0.8–1.0)

## 2012-07-02 LAB — CBC WITH DIFFERENTIAL/PLATELET
Basophils Relative: 0.7 % (ref 0.0–3.0)
Hemoglobin: 14.8 g/dL (ref 12.0–15.0)
Lymphocytes Relative: 32.7 % (ref 12.0–46.0)
Monocytes Relative: 7.3 % (ref 3.0–12.0)
Neutro Abs: 4.4 10*3/uL (ref 1.4–7.7)
RBC: 4.75 Mil/uL (ref 3.87–5.11)
WBC: 7.5 10*3/uL (ref 4.5–10.5)

## 2012-07-02 NOTE — Patient Instructions (Addendum)
Your physician has requested that you have a cardiac catheterization. Cardiac catheterization is used to diagnose and/or treat various heart conditions. Doctors may recommend this procedure for a number of different reasons. The most common reason is to evaluate chest pain. Chest pain can be a symptom of coronary artery disease (CAD), and cardiac catheterization can show whether plaque is narrowing or blocking your heart's arteries. This procedure is also used to evaluate the valves, as well as measure the blood flow and oxygen levels in different parts of your heart. For further information please visit [email or web address]. Please follow instruction sheet, as given.  Your physician recommends that you continue on your current medications as directed. Please refer to the Current Medication list given to you today.  Your physician recommends that you have lab work today: BMP, CBC and PT/INR  

## 2012-07-02 NOTE — Progress Notes (Signed)
 HPI:  71-year-old woman presenting for followup evaluation. She has coronary and peripheral arterial disease. 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 At that time she presented with unstable angina and had a difficult hospital course with diastolic heart failure and COPD exacerbation. Over the past 2 months she has began to feel similar symptoms once again. She complains of marked shortness of breath with activity. She has episodes of chest tightness with exertion as well. Even taking a shower makes her feel bad. She feels weak all over and feels exactly the way she did last year before presenting with unstable angina. Her symptoms have not progressed to the severity level of those last year. She denies leg swelling, orthopnea, or PND. She's been taking nitroglycerin frequently. This relieves her symptoms.  Outpatient Encounter Prescriptions as of 07/02/2012  Medication Sig Dispense Refill  . albuterol (PROVENTIL,VENTOLIN) 90 MCG/ACT inhaler Inhale 2 puffs into the lungs every 4 (four) hours as needed for wheezing. Follow up with your pulmonary doctor for this medicine.  17 g  1  . amLODipine (NORVASC) 5 MG tablet Take 1 tablet by mouth Daily.      . aspirin 81 MG tablet Take 1 tablet (81 mg total) by mouth daily.      . Cholecalciferol (VITAMIN D3) 1000 UNITS CAPS Take 1,000 Units by mouth daily.      . clonazePAM (KLONOPIN) 0.5 MG tablet Take 1 tablet (0.5 mg total) by mouth 2 (two) times daily as needed.  60 tablet  4  . clopidogrel (PLAVIX) 75 MG tablet Take 1 tablet (75 mg total) by mouth daily.  90 tablet  3  . diphenhydrAMINE (SIMPLY SLEEP) 25 MG tablet Take 1 tablet (25 mg total) by mouth at bedtime as needed. For sleep. Do not take this and clonazepam together.  30 tablet    . losartan-hydrochlorothiazide (HYZAAR) 100-12.5 MG per tablet Take 1 tablet by mouth daily.  90 tablet  3  . nitroGLYCERIN (NITROSTAT) 0.4 MG SL  tablet Place 0.4 mg under the tongue every 5 (five) minutes as needed.      . simvastatin (ZOCOR) 20 MG tablet Take 1 tablet (20 mg total) by mouth at bedtime.  90 tablet  1  . tiotropium (SPIRIVA) 18 MCG inhalation capsule Place 1 capsule (18 mcg total) into inhaler and inhale daily.  90 capsule  3  . escitalopram (LEXAPRO) 5 MG tablet Take 1 tablet (5 mg total) by mouth daily.  90 tablet  1  . methocarbamol (ROBAXIN) 500 MG tablet Take 500 mg by mouth every 6 (six) hours as needed.        No Known Allergies  Past Medical History  Diagnosis Date  . Osteopenia   . COPD (chronic obstructive pulmonary disease)   . Hyperlipidemia   . Thyroid nodule   . Abnormal LFTs   . Depression   . HTN (hypertension)     intolerant to ACEi (cough)  . Allergic rhinitis   . Anxiety   . Panic attacks   . PAD (peripheral artery disease)     At [facility] 11/12:  critical stenosis noted just below the sheath site leading into a totally occluded SFA and a patent deep femoral artery  . Carotid artery disease     moderate; asymptomatic  . CAD (coronary artery disease)     s/p DES to the circumflex 12/11;  LHC 07/02/11: pLAD 30%, pCFX stent with 95% ISR involving origin   of OM1 with 90-95% prox stenosis, oRCA 95% - vessel is sub totally occluded and fills from LCA collats; EF 55-65%.  PCI:  Complex PCI (POBA) of the CFX/OM bifurcation with IVUS guidance;  Echocardiogram 07/04/11: Mild LVH, EF 60-65%    ROS: Negative except as per HPI  BP 132/60  Pulse 68  Ht 5' 2" (1.575 m)  Wt 59.875 kg (132 lb)  BMI 24.14 kg/m2  SpO2 97%  PHYSICAL EXAM: Pt is alert and oriented, NAD HEENT: normal Neck: JVP - normal, carotids 2+= without bruits Lungs: CTA bilaterally with prolonged expiratory phase CV: RRR without murmur or gallop, distant heart sounds Abd: soft, NT, Positive BS, no hepatomegaly Ext: no C/C/E, distal pulses intact and equal Skin: warm/dry no rash  EKG:  Normal sinus rhythm 68 beats per minute,  nonspecific ST abnormality.  ASSESSMENT AND PLAN: 1. Coronary artery disease native vessel. The patient has CCS class III angina symptoms and is approaching class IV symptoms. She is on a good medical program that includes continue dual antiplatelet therapy with aspirin and Plavix. She's been taking nitroglycerin almost every day. She clearly meets indications for cardiac catheterization in possible PCI. I have reviewed the risks, indications, and alternatives with her. She understands and agrees to proceed. She knows to call 911 if she develops resting symptoms refractory to nitroglycerin.  2. Hypertension. Blood pressure is controlled on a combination of losartan, hydrochlorothiazide, and amlodipine.  Patricia [name] 07/02/2012 2:44 PM      

## 2012-07-05 ENCOUNTER — Encounter (HOSPITAL_COMMUNITY): Payer: Self-pay | Admitting: General Practice

## 2012-07-05 ENCOUNTER — Encounter (HOSPITAL_COMMUNITY)
Admission: RE | Disposition: A | Discharge status: Home / Self Care | Payer: Self-pay | Source: Ambulatory Visit | Attending: Cardiovascular Disease

## 2012-07-05 ENCOUNTER — Ambulatory Visit (HOSPITAL_COMMUNITY)
Admission: RE | Admit: 2012-07-05 | Discharge: 2012-07-06 | Disposition: A | Discharge status: Home / Self Care | Payer: Medicare Other | Source: Ambulatory Visit | Attending: Cardiovascular Disease | Admitting: Cardiovascular Disease

## 2012-07-05 DIAGNOSIS — I2510 Atherosclerotic heart disease of native coronary artery without angina pectoris: Secondary | ICD-10-CM | POA: Diagnosis not present

## 2012-07-05 DIAGNOSIS — I6529 Occlusion and stenosis of unspecified carotid artery: Secondary | ICD-10-CM | POA: Insufficient documentation

## 2012-07-05 DIAGNOSIS — Z79899 Other long term (current) drug therapy: Secondary | ICD-10-CM | POA: Diagnosis not present

## 2012-07-05 DIAGNOSIS — M899 Disorder of bone, unspecified: Secondary | ICD-10-CM | POA: Insufficient documentation

## 2012-07-05 DIAGNOSIS — Z7902 Long term (current) use of antithrombotics/antiplatelets: Secondary | ICD-10-CM | POA: Insufficient documentation

## 2012-07-05 DIAGNOSIS — Z9861 Coronary angioplasty status: Secondary | ICD-10-CM

## 2012-07-05 DIAGNOSIS — F3289 Other specified depressive episodes: Secondary | ICD-10-CM | POA: Insufficient documentation

## 2012-07-05 DIAGNOSIS — F411 Generalized anxiety disorder: Secondary | ICD-10-CM | POA: Diagnosis not present

## 2012-07-05 DIAGNOSIS — F410 Panic disorder [episodic paroxysmal anxiety] without agoraphobia: Secondary | ICD-10-CM | POA: Diagnosis not present

## 2012-07-05 DIAGNOSIS — J309 Allergic rhinitis, unspecified: Secondary | ICD-10-CM | POA: Insufficient documentation

## 2012-07-05 DIAGNOSIS — E785 Hyperlipidemia, unspecified: Secondary | ICD-10-CM | POA: Diagnosis not present

## 2012-07-05 DIAGNOSIS — J4489 Other specified chronic obstructive pulmonary disease: Secondary | ICD-10-CM | POA: Insufficient documentation

## 2012-07-05 DIAGNOSIS — M949 Disorder of cartilage, unspecified: Secondary | ICD-10-CM | POA: Insufficient documentation

## 2012-07-05 DIAGNOSIS — F329 Major depressive disorder, single episode, unspecified: Secondary | ICD-10-CM | POA: Insufficient documentation

## 2012-07-05 DIAGNOSIS — I10 Essential (primary) hypertension: Secondary | ICD-10-CM | POA: Insufficient documentation

## 2012-07-05 DIAGNOSIS — I200 Unstable angina: Secondary | ICD-10-CM

## 2012-07-05 DIAGNOSIS — E041 Nontoxic single thyroid nodule: Secondary | ICD-10-CM | POA: Diagnosis not present

## 2012-07-05 DIAGNOSIS — J449 Chronic obstructive pulmonary disease, unspecified: Secondary | ICD-10-CM | POA: Insufficient documentation

## 2012-07-05 DIAGNOSIS — Y849 Medical procedure, unspecified as the cause of abnormal reaction of the patient, or of later complication, without mention of misadventure at the time of the procedure: Secondary | ICD-10-CM | POA: Insufficient documentation

## 2012-07-05 HISTORY — PX: LEFT HEART CATHETERIZATION WITH CORONARY ANGIOGRAM: SHX5451

## 2012-07-05 HISTORY — PX: PERCUTANEOUS CORONARY INTERVENTION-BALLOON ONLY: SHX6014

## 2012-07-05 HISTORY — DX: Unspecified osteoarthritis, unspecified site: M19.90

## 2012-07-05 SURGERY — LEFT HEART CATHETERIZATION WITH CORONARY ANGIOGRAM
Anesthesia: LOCAL

## 2012-07-05 MED ORDER — SODIUM CHLORIDE 0.9 % IV SOLN
INTRAVENOUS | Status: DC
  Administered 2012-07-05: 06:00:00 via INTRAVENOUS

## 2012-07-05 MED ORDER — TIOTROPIUM BROMIDE MONOHYDRATE 18 MCG IN CAPS
18.0000 ug | ORAL_CAPSULE | Freq: Every day | RESPIRATORY_TRACT | Status: DC
  Filled 2012-07-05: qty 5

## 2012-07-05 MED ORDER — CLOPIDOGREL BISULFATE 75 MG PO TABS
75.0000 mg | ORAL_TABLET | Freq: Every day | ORAL | Status: DC
  Administered 2012-07-06: 08:00:00 75 mg via ORAL
  Filled 2012-07-05: qty 1

## 2012-07-05 MED ORDER — SODIUM CHLORIDE 0.9 % IJ SOLN: 3.0000 mL | Freq: Two times a day (BID) | INTRAMUSCULAR | Status: DC

## 2012-07-05 MED ORDER — DIPHENHYDRAMINE HCL 25 MG PO CAPS: 25.0000 mg | ORAL_CAPSULE | Freq: Every evening | ORAL | Status: DC | PRN

## 2012-07-05 MED ORDER — ACETAMINOPHEN 325 MG PO TABS: 650.0000 mg | ORAL_TABLET | ORAL | Status: DC | PRN

## 2012-07-05 MED ORDER — FENTANYL CITRATE 0.05 MG/ML IJ SOLN
INTRAMUSCULAR | Status: AC
  Filled 2012-07-05: qty 2

## 2012-07-05 MED ORDER — ALBUTEROL SULFATE HFA 108 (90 BASE) MCG/ACT IN AERS
2.0000 | INHALATION_SPRAY | RESPIRATORY_TRACT | Status: DC | PRN
  Filled 2012-07-05: qty 6.7

## 2012-07-05 MED ORDER — MIDAZOLAM HCL 2 MG/2ML IJ SOLN
INTRAMUSCULAR | Status: AC
  Filled 2012-07-05: qty 2

## 2012-07-05 MED ORDER — OXYCODONE-ACETAMINOPHEN 5-325 MG PO TABS: 1.0000 | ORAL_TABLET | ORAL | Status: DC | PRN

## 2012-07-05 MED ORDER — ALBUTEROL 90 MCG/ACT IN AERS: 2.0000 | INHALATION_SPRAY | RESPIRATORY_TRACT | Status: DC | PRN

## 2012-07-05 MED ORDER — SODIUM CHLORIDE 0.9 % IV SOLN
1.0000 mL/kg/h | INTRAVENOUS | Status: DC
  Administered 2012-07-05 (×2): 1 mL/kg/h via INTRAVENOUS

## 2012-07-05 MED ORDER — HEPARIN (PORCINE) IN NACL 2-0.9 UNIT/ML-% IJ SOLN
INTRAMUSCULAR | Status: AC
  Filled 2012-07-05: qty 1000

## 2012-07-05 MED ORDER — CLONAZEPAM 0.5 MG PO TABS
0.5000 mg | ORAL_TABLET | Freq: Two times a day (BID) | ORAL | Status: DC | PRN
  Administered 2012-07-05: 21:00:00 0.5 mg via ORAL
  Filled 2012-07-05: qty 1

## 2012-07-05 MED ORDER — DIAZEPAM 5 MG PO TABS
5.0000 mg | ORAL_TABLET | ORAL | Status: DC | PRN
  Administered 2012-07-05: 13:00:00 5 mg via ORAL
  Filled 2012-07-05: qty 1

## 2012-07-05 MED ORDER — LIDOCAINE HCL (PF) 1 % IJ SOLN
INTRAMUSCULAR | Status: AC
  Filled 2012-07-05: qty 30

## 2012-07-05 MED ORDER — ASPIRIN EC 81 MG PO TBEC
81.0000 mg | DELAYED_RELEASE_TABLET | Freq: Every day | ORAL | Status: DC
  Filled 2012-07-05: qty 1

## 2012-07-05 MED ORDER — HYDROCHLOROTHIAZIDE 12.5 MG PO CAPS
12.5000 mg | ORAL_CAPSULE | Freq: Every day | ORAL | Status: DC
  Filled 2012-07-05: qty 1

## 2012-07-05 MED ORDER — BIVALIRUDIN 250 MG IV SOLR
INTRAVENOUS | Status: AC
  Filled 2012-07-05: qty 250

## 2012-07-05 MED ORDER — DIPHENHYDRAMINE HCL (SLEEP) 25 MG PO TABS: 25.0000 mg | ORAL_TABLET | Freq: Every evening | ORAL | Status: DC | PRN

## 2012-07-05 MED ORDER — ASPIRIN 81 MG PO CHEW
324.0000 mg | CHEWABLE_TABLET | ORAL | Status: AC
  Administered 2012-07-05: 324 mg via ORAL
  Filled 2012-07-05: qty 4

## 2012-07-05 MED ORDER — LOSARTAN POTASSIUM 50 MG PO TABS
100.0000 mg | ORAL_TABLET | Freq: Every day | ORAL | Status: DC
  Filled 2012-07-05: qty 2

## 2012-07-05 MED ORDER — ASPIRIN 81 MG PO CHEW: 81.0000 mg | CHEWABLE_TABLET | Freq: Every day | ORAL | Status: DC

## 2012-07-05 MED ORDER — NITROGLYCERIN 0.2 MG/ML ON CALL CATH LAB
INTRAVENOUS | Status: AC
  Filled 2012-07-05: qty 1

## 2012-07-05 MED ORDER — SIMVASTATIN 20 MG PO TABS
20.0000 mg | ORAL_TABLET | Freq: Every day | ORAL | Status: DC
  Filled 2012-07-05 (×2): qty 1

## 2012-07-05 MED ORDER — ASPIRIN 325 MG PO TABS: 81.0000 mg | ORAL_TABLET | Freq: Every day | ORAL | Status: DC

## 2012-07-05 MED ORDER — ONDANSETRON HCL 4 MG/2ML IJ SOLN: 4.0000 mg | Freq: Four times a day (QID) | INTRAMUSCULAR | Status: DC | PRN

## 2012-07-05 MED ORDER — DIAZEPAM 5 MG PO TABS
5.0000 mg | ORAL_TABLET | ORAL | Status: AC
  Administered 2012-07-05: 5 mg via ORAL
  Filled 2012-07-05: qty 1

## 2012-07-05 MED ORDER — SODIUM CHLORIDE 0.9 % IJ SOLN: 3.0000 mL | INTRAMUSCULAR | Status: DC | PRN

## 2012-07-05 MED ORDER — LOSARTAN POTASSIUM-HCTZ 100-12.5 MG PO TABS: 1.0000 | ORAL_TABLET | Freq: Every day | ORAL | Status: DC

## 2012-07-05 MED ORDER — SODIUM CHLORIDE 0.9 % IV SOLN: 250.0000 mL | INTRAVENOUS | Status: DC | PRN

## 2012-07-05 MED ORDER — AMLODIPINE BESYLATE 5 MG PO TABS
5.0000 mg | ORAL_TABLET | Freq: Every day | ORAL | Status: DC
  Filled 2012-07-05 (×2): qty 1

## 2012-07-05 MED ORDER — CLOPIDOGREL BISULFATE 75 MG PO TABS: 75.0000 mg | ORAL_TABLET | ORAL | Status: DC

## 2012-07-05 NOTE — Interval H&P Note (Signed)
History and Physical Interval Note:  07/05/2012 8:46 AM  Patricia [name]  has presented today for surgery, with the diagnosis of Chest pain  The various methods of treatment have been discussed with the patient and family. After consideration of risks, benefits and other options for treatment, the patient has consented to  Procedure(s) (LRB) with comments: LEFT HEART CATHETERIZATION WITH CORONARY ANGIOGRAM (N/A) PERCUTANEOUS CORONARY INTERVENTION-BALLOON ONLY () as a surgical intervention .  The patient's history has been reviewed, patient examined, no change in status, stable for surgery.  I have reviewed the patient's chart and labs.  Questions were answered to the patient's satisfaction.     Tonny Bollman

## 2012-07-05 NOTE — H&P (View-Only) (Signed)
HPI:  71 year old woman presenting for followup evaluation. She has coronary and peripheral arterial disease. 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 At that time she presented with unstable angina and had a difficult hospital course with diastolic heart failure and COPD exacerbation. Over the past 2 months she has began to feel similar symptoms once again. She complains of marked shortness of breath with activity. She has episodes of chest tightness with exertion as well. Even taking a shower makes her feel bad. She feels weak all over and feels exactly the way she did last year before presenting with unstable angina. Her symptoms have not progressed to the severity level of those last year. She denies leg swelling, orthopnea, or PND. She's been taking nitroglycerin frequently. This relieves her symptoms.  Outpatient Encounter Prescriptions as of 07/02/2012  Medication Sig Dispense Refill  . albuterol (PROVENTIL,VENTOLIN) 90 MCG/ACT inhaler Inhale 2 puffs into the lungs every 4 (four) hours as needed for wheezing. Follow up with your pulmonary doctor for this medicine.  17 g  1  . amLODipine (NORVASC) 5 MG tablet Take 1 tablet by mouth Daily.      Marland Kitchen aspirin 81 MG tablet Take 1 tablet (81 mg total) by mouth daily.      . Cholecalciferol (VITAMIN D3) 1000 UNITS CAPS Take 1,000 Units by mouth daily.      . clonazePAM (KLONOPIN) 0.5 MG tablet Take 1 tablet (0.5 mg total) by mouth 2 (two) times daily as needed.  60 tablet  4  . clopidogrel (PLAVIX) 75 MG tablet Take 1 tablet (75 mg total) by mouth daily.  90 tablet  3  . diphenhydrAMINE (SIMPLY SLEEP) 25 MG tablet Take 1 tablet (25 mg total) by mouth at bedtime as needed. For sleep. Do not take this and clonazepam together.  30 tablet    . losartan-hydrochlorothiazide (HYZAAR) 100-12.5 MG per tablet Take 1 tablet by mouth daily.  90 tablet  3  . nitroGLYCERIN (NITROSTAT) 0.4 MG SL  tablet Place 0.4 mg under the tongue every 5 (five) minutes as needed.      . simvastatin (ZOCOR) 20 MG tablet Take 1 tablet (20 mg total) by mouth at bedtime.  90 tablet  1  . tiotropium (SPIRIVA) 18 MCG inhalation capsule Place 1 capsule (18 mcg total) into inhaler and inhale daily.  90 capsule  3  . escitalopram (LEXAPRO) 5 MG tablet Take 1 tablet (5 mg total) by mouth daily.  90 tablet  1  . methocarbamol (ROBAXIN) 500 MG tablet Take 500 mg by mouth every 6 (six) hours as needed.        No Known Allergies  Past Medical History  Diagnosis Date  . Osteopenia   . COPD (chronic obstructive pulmonary disease)   . Hyperlipidemia   . Thyroid nodule   . Abnormal LFTs   . Depression   . HTN (hypertension)     intolerant to ACEi (cough)  . Allergic rhinitis   . Anxiety   . Panic attacks   . PAD (peripheral artery disease)     At [facility] 11/12:  critical stenosis noted just below the sheath site leading into a totally occluded SFA and a patent deep femoral artery  . Carotid artery disease     moderate; asymptomatic  . CAD (coronary artery disease)     s/p DES to the circumflex 12/11;  LHC 07/02/11: pLAD 30%, pCFX stent with 95% ISR involving origin  of OM1 with 90-95% prox stenosis, oRCA 95% - vessel is sub totally occluded and fills from LCA collats; EF 55-65%.  PCI:  Complex PCI (POBA) of the CFX/OM bifurcation with IVUS guidance;  Echocardiogram 07/04/11: Mild LVH, EF 60-65%    ROS: Negative except as per HPI  BP 132/60  Pulse 68  Ht 5\' 2"  (1.575 m)  Wt 59.875 kg (132 lb)  BMI 24.14 kg/m2  SpO2 97%  PHYSICAL EXAM: Pt is alert and oriented, NAD HEENT: normal Neck: JVP - normal, carotids 2+= without bruits Lungs: CTA bilaterally with prolonged expiratory phase CV: RRR without murmur or gallop, distant heart sounds Abd: soft, NT, Positive BS, no hepatomegaly Ext: no C/C/E, distal pulses intact and equal Skin: warm/dry no rash  EKG:  Normal sinus rhythm 68 beats per minute,  nonspecific ST abnormality.  ASSESSMENT AND PLAN: 1. Coronary artery disease native vessel. The patient has CCS class III angina symptoms and is approaching class IV symptoms. She is on a good medical program that includes continue dual antiplatelet therapy with aspirin and Plavix. She's been taking nitroglycerin almost every day. She clearly meets indications for cardiac catheterization in possible PCI. I have reviewed the risks, indications, and alternatives with her. She understands and agrees to proceed. She knows to call 911 if she develops resting symptoms refractory to nitroglycerin.  2. Hypertension. Blood pressure is controlled on a combination of losartan, hydrochlorothiazide, and amlodipine.  Tonny Bollman 07/02/2012 2:44 PM

## 2012-07-05 NOTE — CV Procedure (Signed)
  Cardiac Catheterization Procedure Note  Name: Patricia [name] MRN: [id number] DOB: 25-Jan-1941  Procedure: Left Heart Cath, Selective Coronary Angiography, LV angiography,  PTCA of the LCx  Indication: CCS Class 3 angina   Diagnostic Procedure Details: The left groin was prepped, draped, and anesthetized with 1% lidocaine. Using the modified Seldinger technique, a 5 French sheath was introduced into the left femoral artery. Standard Judkins catheters were used for selective coronary angiography and left ventriculography. Catheter exchanges were performed over a wire.  The diagnostic procedure was well-tolerated without immediate complications.  PROCEDURAL FINDINGS Hemodynamics: AO 131/47 LV 131/10  Coronary angiography: Coronary dominance: right  Left mainstem: The left mainstem is patent but heavily calcified. There is 20-30% distal left main stenosis.  Left anterior descending (LAD): The LAD is severely calcified. The vessel is patent throughout its course and it wraps around the left ventricular apex. The proximal LAD has 30-40% calcific stenosis but there are no areas of high-grade stenosis identified. The septal perforators of the LAD supply collaterals to the distal right coronary artery branch vessels.  Left circumflex (LCx): The left circumflex is heavily calcified. There is a stent in the proximal circumflex it crosses the first obtuse marginal origin. There is 80% stenosis in the midportion of the stent at the AV groove circumflex where the OM originates. The OM has a 70% ostial stenosis. The remaining portions of the circumflex are patent and gives off 2 more distal obtuse marginal subbranches.  Right coronary artery (RCA): The RCA is severely calcified. The vessel was subtotally occluded with collateral supply by the LAD. There is 99% stenosis present.  Left ventriculography: Left ventricular systolic function is normal, LVEF is estimated at 55-65%, there is no significant mitral  regurgitation   PCI Procedure Note:  Following the diagnostic procedure, the decision was made to proceed with PCI. I carefully reviewed the patient's films and elected to intervene on the left circumflex. The sheath was upsized to a 6 [place]. Weight-based bivalirudin was given for anticoagulation. Once a therapeutic ACT was achieved, a 6 [place] XB 3.5 cm guide catheter was inserted.  A pro-water coronary guidewire was used to cross the lesion.  The lesion was predilated with a 2.5 x 15 mm balloon.  The lesion was then treated with a 3.0 x 10 mm cutting balloon to 12 atmospheres on 2 inflations. The OM side branch remained patent with no significant change in the ostial disease. There was 20% residual stenosis in the AV groove circumflex and I thought this was an acceptable result in this patient with in-stent restenosis and a heavily calcified vessel. TIMI-3 flow was present. Final angiography confirmed an acceptable result. Femoral hemostasis will be achieved with manual hemostasis because of heavy femoral calcification.  The patient tolerated the PCI procedure well. There were no immediate procedural complications.  The patient was transferred to the post catheterization recovery area for further monitoring.  PCI Data: Vessel - left circumflex/Segment - proximal Percent Stenosis (pre)  80 TIMI-flow 3 Stent none Percent Stenosis (post) 20 TIMI-flow (post) 3  Final Conclusions:   1. 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 Nonobstructive LAD stenosis 4. Normal left ventricular function  Recommendations: Continue current medical program. The patient should remain on long-term dual antiplatelet therapy with aspirin and Plavix.  Tonny Bollman 07/05/2012, 8:53 AM

## 2012-07-06 ENCOUNTER — Encounter (HOSPITAL_COMMUNITY): Payer: Self-pay | Admitting: Physician Assistant

## 2012-07-06 DIAGNOSIS — I2510 Atherosclerotic heart disease of native coronary artery without angina pectoris: Secondary | ICD-10-CM | POA: Diagnosis not present

## 2012-07-06 DIAGNOSIS — I200 Unstable angina: Secondary | ICD-10-CM

## 2012-07-06 LAB — CBC
HCT: 40 % (ref 36.0–46.0)
Hemoglobin: 13.7 g/dL (ref 12.0–15.0)
WBC: 5.8 10*3/uL (ref 4.0–10.5)

## 2012-07-06 LAB — BASIC METABOLIC PANEL
BUN: 14 mg/dL (ref 6–23)
Chloride: 104 mEq/L (ref 96–112)
Glucose, Bld: 93 mg/dL (ref 70–99)
Potassium: 3.9 mEq/L (ref 3.5–5.1)

## 2012-07-06 MED FILL — Dextrose Inj 5%: INTRAVENOUS | Qty: 50 | Status: AC

## 2012-07-06 NOTE — Discharge Summary (Addendum)
Discharge Summary   Patient ID: Patricia [name] MRN: [id number], DOB/AGE: Feb 27, 1941 71 y.o. Admit date: 07/05/2012 D/C date:     07/06/2012  Primary Cardiologist: Excell Seltzer  Primary Discharge Diagnoses:  1. CAD/unstable angina  - severe recurrent ISR of LCx involving bifurcation of AV groove Cx and first OM branches s/p PTCA/cutting balloon of LCx (additional findings: chronic subtotal occ of RCA with L-R collaterals, nonobst LAD/LM stenosis)  - history: DES to LCX 2011, s/p PTCA to LCx/OM bifurcation 07/02/11  2. HTN   Secondary Discharge Diagnoses:  1. PVD - carotid artery disease, moderate - f/u duplex due 11/2012 - LHC 06/2011: critical stenosis noted just below the sheath site leading into a totally occluded SFA and a patent deep femoral artery 2. Osteopenia  3. COPD, no longer on home O2 4. HL  5. Thyroid nodule  6. Abnormal LFT's  7. Anxiety 8. Depression  9. Allergic rhinitis  10. Panic attacks  Hospital Course: Patricia [name] is a 71 year old woman with CAD, PAD and COPD. Over the last 2 months she has begun to experience symptoms strikingly similar to her prior angina. She complained of marked shortness of breath with activity. She has had episodes of chest tightness with exertion as well. Even taking a shower would make her feel bad. She denied leg swelling, orthopnea, or PND. She has been taking NTG frequently with relief. Her symptoms were felt concerning for [place]. Cardiac cath was recommended and she underwent this procedure 11/25 demonstrating:  1. Severe recurrent in-stent restenosis in the left circumflex involving the bifurcation of the AV groove circumflex and first obtuse marginal branches  2. 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 Nonobstructive LAD stenosis  4. Normal left ventricular function She subsequently underwent PTCA/cutting balloon angioplasty to the prox LCx. She tolerated this procedure well. This morning  she feels much better. She has ambulated with cardiac rehab. Dr. Excell Seltzer has seen and examined her today and feels she is stable for discharge. Due to her wide pulse pressure, we have elected not to change her antihypertensives. Per discussion with Dr. Excell Seltzer, she will continue on ASA, Plavix, and statin. She is not on BB due to baseline HR in the 60s and COPD.   Discharge Vitals: Blood pressure 150/63, pulse 63, temperature 97.7 F (36.5 C), temperature source Oral, resp. rate 20, height 5\' 3"  (1.6 m), weight 134 lb 4.2 oz (60.9 kg), SpO2 94.00%.  Labs: Lab Results  Component Value Date   WBC 5.8 07/06/2012   HGB 13.7 07/06/2012   HCT 40.0 07/06/2012   MCV 92.8 07/06/2012   PLT 199 07/06/2012     Lab 07/06/12 0505  NA 139  K 3.9  CL 104  CO2 30  BUN 14  CREATININE 0.59  CALCIUM 9.0  PROT --  BILITOT --  ALKPHOS --  ALT --  AST --  GLUCOSE 93    Lab Results  Component Value Date   CHOL 141 07/03/2011   HDL 71 07/03/2011   LDLCALC 59 07/03/2011   TRIG 55 07/03/2011    Diagnostic Studies/Procedures   1. Cardiac catheterization this admission, please see full report and above for summary.   Discharge Medications   Current Discharge Medication List    CONTINUE these medications which have NOT CHANGED   Details  aspirin 81 MG tablet Take 1 tablet (81 mg total) by mouth daily.    Cholecalciferol (VITAMIN D3) 1000 UNITS CAPS Take 1,000 Units by mouth daily.  clonazePAM (KLONOPIN) 0.5 MG tablet Take 0.5 mg by mouth 2 (two) times daily as needed. For anxiety.    clopidogrel (PLAVIX) 75 MG tablet Take 1 tablet (75 mg total) by mouth daily.     diphenhydrAMINE (SIMPLY SLEEP) 25 MG tablet Take 1 tablet (25 mg total) by mouth at bedtime as needed. For sleep. Do not take this and clonazepam together.     losartan-hydrochlorothiazide (HYZAAR) 100-12.5 MG per tablet Take 1 tablet by mouth daily.    Associated Diagnoses: Coronary atherosclerosis of unspecified type of  vessel, native or graft    nitroGLYCERIN (NITROSTAT) 0.4 MG SL tablet Place 0.4 mg under the tongue every 5 (five) minutes as needed. Chest pain.    simvastatin (ZOCOR) 20 MG tablet Take 1 tablet (20 mg total) by mouth at bedtime.     tiotropium (SPIRIVA) 18 MCG inhalation capsule Place 1 capsule (18 mcg total) into inhaler and inhale daily.     albuterol (PROVENTIL,VENTOLIN) 90 MCG/ACT inhaler Inhale 2 puffs into the lungs every 4 (four) hours as needed for wheezing. Follow up with your pulmonary doctor for this medicine.     amLODipine (NORVASC) 5 MG tablet Take 1 tablet by mouth Daily.        Disposition   The patient will be discharged in stable condition to home. Discharge Orders    Future Appointments: Provider: Department: Dept Phone: Center:   07/20/2012 2:00 PM Beatrice Lecher, PA [facility] [place] Main [facility] [place]) [phone] [facility]   09/03/2012 3:15 PM Tonny Bollman, MD [facility] Main Office [place]) [phone] [facility]   09/06/2012 1:00 PM Wanda Plump, MD [facility]  [place] [phone] [facility]     Future Orders Please Complete By Expires   Diet - low sodium heart healthy      Increase activity slowly      Comments:   No driving for 2 days. No lifting over 5 lbs for 1 week. No sexual activity for 1 week. Keep procedure site clean & dry. If you notice increased pain, swelling, bleeding or pus, call/return!  You may shower, but no soaking baths/hot tubs/pools for 1 week.     Follow-up Information    Follow up with Tereso Newcomer, PA. (07/20/12 at 2pm)    Contact information:   [facility] [address] [place] [address] [phone]            Duration of Discharge Encounter: Greater than 30 minutes including physician and PA time.  Signed, Ronie Spies PA-C 07/06/2012, 9:05 AM  Patient seen, examined. Available data reviewed. Agree with findings, assessment, and plan as outlined by  Ronie Spies, PA-C. The patient is stable after cutting balloon angioplasty. Her exam reveals no hematoma or ecchymoses at her left groin access site. She will be discharged this morning to continue on dual antiplatelet therapy with aspirin and Plavix long-term.  Tonny Bollman, M.D. 07/06/2012 9:15 AM

## 2012-07-06 NOTE — Progress Notes (Signed)
  Patient: Patricia [name] / Admit Date: 07/05/2012 / Date of Encounter: 07/06/2012, 8:27 AM   Subjective  "I feel a thousand times better." No CP or SOB.   Objective   Telemetry: NSR/SB Physical Exam: Filed Vitals:   07/06/12 0403  BP: 152/58  Pulse: 58  Temp: 97.7 F (36.5 C)  Resp: 11   General: Well developed, well nourished WF in no acute distress. Head: Normocephalic, atraumatic, sclera non-icteric, no xanthomas, nares are without discharge. Neck: JVD not elevated. Lungs: Decreased BS throughout but clear to auscultation without wheezes, rales, or rhonchi. Breathing is unlabored. Heart: RRR S1 S2 without murmurs, rubs, or gallops.  Abdomen: Soft, non-tender, non-distended with normoactive bowel sounds. No hepatomegaly. No rebound/guarding. No obvious abdominal masses. Msk:  Strength and tone appear normal for age. Extremities: No clubbing or cyanosis. No edema.  Distal pedal pulses are 2+ and equal bilaterally. R groin dressing removed and site stable. No oozing. Mild ecchymosis. Neuro: Alert and oriented X 3. Moves all extremities spontaneously. Psych:  Responds to questions appropriately with a normal affect.    Intake/Output Summary (Last 24 hours) at 07/06/12 0827 Last data filed at 07/06/12 0600  Gross per 24 hour  Intake    480 ml  Output   1550 ml  Net  -1070 ml    Inpatient Medications:    . amLODipine  5 mg Oral Daily  . aspirin EC  81 mg Oral Daily  . clopidogrel  75 mg Oral Q breakfast  . hydrochlorothiazide  12.5 mg Oral Daily  . losartan  100 mg Oral Daily  . simvastatin  20 mg Oral QHS  . tiotropium  18 mcg Inhalation Daily  . [DISCONTINUED] aspirin  81 mg Oral Daily  . [DISCONTINUED] aspirin  162.5 mg Oral Daily  . [DISCONTINUED] clopidogrel  75 mg Oral Pre-Cath  . [DISCONTINUED] losartan-hydrochlorothiazide  1 tablet Oral Daily  . [DISCONTINUED] sodium chloride  3 mL Intravenous Q12H    Labs:  [facility] 07/06/12 0505  NA 139  K 3.9  CL 104    CO2 30  GLUCOSE 93  BUN 14  CREATININE 0.59  CALCIUM 9.0  MG --  PHOS --    Basename 07/06/12 0505  WBC 5.8  NEUTROABS --  HGB 13.7  HCT 40.0  MCV 92.8  PLT 199    Radiology/Studies:  Cath this admission   Assessment and Plan  1. CAD/unstable angina with severe recurrent ISR of LCx involving bifurcation of AV groove Cx s/p PTCA/cutting balloon of LCx (first OM branches / chronic subtotal occ of RCA with L-R collaterals, nonobst LAD/LM stenosis) - history: DES to LCX 2011, s/p PTCA to LCx/OM bifurcation 07/02/11  - plan ASA/Plavix, statin. Not on BB presumably due to HR and COPD 2. HTN  3. H/o COPD - no longer on home O2 4. Carotid artery disease, moderate - f/u duplex will be due 11/2012  Signed, Ronie Spies PA-C  Patient seen, examined. Available data reviewed. Agree with findings, assessment, and plan as outlined by Ronie Spies, PA-C. Patient is stable following PCI yesterday. She underwent cutting balloon angioplasty. She should remain on dual antiplatelet therapy indefinitely. She is ready for discharge today. Her exam reveals a stable left groin site with no evidence of hematoma or ecchymosis  Tonny Bollman, M.D. 07/06/2012 9:19 AM

## 2012-07-06 NOTE — Progress Notes (Signed)
Addendum to progress note from today: physical exam should read L groin, not R groin. Shanita Kanan PA-C

## 2012-07-06 NOTE — Progress Notes (Signed)
CARDIAC REHAB PHASE I   PRE:  Rate/Rhythm: 65 SR    BP: sitting 154/53    SaO2:   MODE:  Ambulation: 550 ft   POST:  Rate/Rhythm: 80    BP: sitting 150/63     SaO2:   Tolerated well, feeling much better. BP borderline. Ed completed. Pt not very interested in quitting smoking, changing habits. Sts she always has good intentions but hard to get going. Reluctant to do [facility] but agreed for them to call her in the spring. Will send referral to [place] [facility]. Gave smoking resources.  [phone]  Patricia [name] CES, ACSM

## 2012-07-20 ENCOUNTER — Ambulatory Visit (INDEPENDENT_AMBULATORY_CARE_PROVIDER_SITE_OTHER): Payer: Medicare Other | Admitting: Physician Assistant

## 2012-07-20 ENCOUNTER — Encounter: Payer: Self-pay | Admitting: Physician Assistant

## 2012-07-20 VITALS — BP 160/72 | HR 90 | Ht 62.0 in | Wt 132.0 lb

## 2012-07-20 DIAGNOSIS — E785 Hyperlipidemia, unspecified: Secondary | ICD-10-CM

## 2012-07-20 DIAGNOSIS — Z720 Tobacco use: Secondary | ICD-10-CM

## 2012-07-20 DIAGNOSIS — R079 Chest pain, unspecified: Secondary | ICD-10-CM

## 2012-07-20 DIAGNOSIS — R0602 Shortness of breath: Secondary | ICD-10-CM

## 2012-07-20 DIAGNOSIS — I10 Essential (primary) hypertension: Secondary | ICD-10-CM | POA: Diagnosis not present

## 2012-07-20 DIAGNOSIS — F17200 Nicotine dependence, unspecified, uncomplicated: Secondary | ICD-10-CM

## 2012-07-20 DIAGNOSIS — I25810 Atherosclerosis of coronary artery bypass graft(s) without angina pectoris: Secondary | ICD-10-CM

## 2012-07-20 LAB — BASIC METABOLIC PANEL
CO2: 30 mEq/L (ref 19–32)
Calcium: 9.4 mg/dL (ref 8.4–10.5)
Glucose, Bld: 85 mg/dL (ref 70–99)
Potassium: 3.6 mEq/L (ref 3.5–5.1)
Sodium: 132 mEq/L — ABNORMAL LOW (ref 135–145)

## 2012-07-20 MED ORDER — ISOSORBIDE MONONITRATE ER 30 MG PO TB24: 30.0000 mg | ORAL_TABLET | Freq: Every day | ORAL | Status: DC

## 2012-07-20 NOTE — Patient Instructions (Addendum)
Your physician recommends that you schedule a follow-up appointment in:  2 WEEKS WITH [facility] PAC ON DAY DR Excell Seltzer HERE Your physician has recommended you make the following change in your medication:  ADD IMDUR 30 MG 1 EVERY DAY  Your physician recommends that you return for lab work in: TODAY  BMET BNP   DX 401.1  786.05 786.50

## 2012-07-20 NOTE — Progress Notes (Signed)
[address] [place], [place]  [address] Phone: [phone], Fax:  [phone]  Date:  07/20/2012   Name:  Patricia [name]   DOB:  [birth year]   MRN:  [id number]  PCP:  [name], MD  Primary Cardiologist:  Dr. Tonny Bollman  Primary Electrophysiologist:  None    History of Present Illness: [name] is a 71 y.o. female who returns for follow up after recent cardiac catheterization and PCI.  She has a hx of CAD, status post prior PCI in 12/11 with a DES to the CFX. Severe stenosis of the RCA was noted at that time but the distal vessel was supplied by collaterals and this was treated medically. Other hx includes PAD, COPD, HL, HTN, anxiety.  LHC 07/02/11: pLAD 30%, pCFX stent with 95% ISR involving origin of OM1 with 90-95% prox stenosis, oRCA 95% - vessel is sub totally occluded and fills from LCA collats; EF 55-65%. PCI: Complex PCI (POBA) of the CFX/OM bifurcation with IVUS guidance. Of note, there was critical stenosis noted just below the sheath site leading into a totally occluded SFA and a patent deep femoral artery.  Echocardiogram 07/04/11: Mild LVH, EF 60-65%.   She was initially seen by Dr. Excell Seltzer 11/24 and noted episodes of chest tightness. She was felt to have symptoms consistent with CCS class III angina. Cardiac catheterization was arranged. She was admitted 11/25-11/26. LHC 07/05/12: dLM 20-30%, pLAD 30-40%, pCFX stent 80% mid, oOM 70%, RCA subtotally (99%) occluded with L->R collats, EF 55-65%. PCI: Cutting balloon angioplasty of the CFX. Chronic subtotal occlusion of the RCA treated medically.  She did well after d/c for about 1 week.  Since then she has noted increased left sided chest tightness/pressure.  This is constant.  She notes increased DOE.  Notes Class IIb-III symptoms.  No syncope.  No orthopnea, PND.  No LE edema.  Symptoms are similar to piror angina.  Also notes some discomfort to her L groin since her LHC.    Labs (11/12): Hemoglobin  12.2, potassium 4.6, creatinine 0.68, ALT 16, TC 141, HDL 71, LDL 59, TG 55.  Labs (11/13):   K 3.9, creatinine 0.59, Hgb 13.7  Wt Readings from Last 3 Encounters:  07/20/12 132 lb (59.875 kg)  07/06/12 134 lb 4.2 oz (60.9 kg)  07/06/12 134 lb 4.2 oz (60.9 kg)     Past Medical History  Diagnosis Date  . Osteopenia   . COPD (chronic obstructive pulmonary disease)   . Hyperlipidemia   . Thyroid nodule   . Abnormal LFTs   . Depression   . HTN (hypertension)     intolerant to ACEi (cough)  . Allergic rhinitis   . Anxiety   . Panic attacks   . PAD (peripheral artery disease)     Carotid dz as  below, and at [facility] 11/12:  critical stenosis noted just below the sheath site leading into a totally occluded SFA and a patent deep femoral artery  . Carotid artery disease     Moderate - f/u dopplers needed 11/2012  . CAD (coronary artery disease)     s/p DES to the circumflex 12/11;  06/2011: complex PCI (POBA) of the CFX/OM bifurcation with IVUS guidance; [place] s/p PTCA/cutting balloon to LCx 06/2012 (chronic subtotally occ RCA with L-R collaterals, residual nonobst LAD dz)   . Arthritis     In her thumb    Current Outpatient Prescriptions  Medication Sig Dispense Refill  . albuterol (PROVENTIL,VENTOLIN) 90 MCG/ACT inhaler  Inhale 2 puffs into the lungs every 4 (four) hours as needed. Follow up with your pulmonary doctor for this medicine.      Marland Kitchen amLODipine (NORVASC) 5 MG tablet Take 1 tablet by mouth Daily.      Marland Kitchen aspirin 81 MG tablet Take 1 tablet (81 mg total) by mouth daily.      . Cholecalciferol (VITAMIN D3) 1000 UNITS CAPS Take 1,000 Units by mouth daily.      . clonazePAM (KLONOPIN) 0.5 MG tablet Take 0.5 mg by mouth 2 (two) times daily as needed. For anxiety.      . clopidogrel (PLAVIX) 75 MG tablet Take 1 tablet (75 mg total) by mouth daily.  90 tablet  3  . diphenhydrAMINE (SIMPLY SLEEP) 25 MG tablet Take 1 tablet (25 mg total) by mouth at bedtime as needed. For sleep. Do not take  this and clonazepam together.  30 tablet    . losartan-hydrochlorothiazide (HYZAAR) 100-12.5 MG per tablet Take 1 tablet by mouth daily.  90 tablet  3  . nitroGLYCERIN (NITROSTAT) 0.4 MG SL tablet Place 0.4 mg under the tongue every 5 (five) minutes as needed. Chest pain.      . simvastatin (ZOCOR) 20 MG tablet Take 1 tablet (20 mg total) by mouth at bedtime.  90 tablet  1  . tiotropium (SPIRIVA) 18 MCG inhalation capsule Place 1 capsule (18 mcg total) into inhaler and inhale daily.  90 capsule  3  . [DISCONTINUED] albuterol (PROVENTIL,VENTOLIN) 90 MCG/ACT inhaler Inhale 2 puffs into the lungs every 4 (four) hours as needed for wheezing. Follow up with your pulmonary doctor for this medicine.  17 g  1    Allergies: Allergies  Allergen Reactions  . Ace Inhibitors     cough   Social History:  The patient  reports that she has been smoking Cigarettes.  She has a 27 pack-year smoking history. She has never used smokeless tobacco. She reports that she drinks about 3.6 ounces of alcohol per week. She reports that she does not use illicit drugs.  ROS:  Please see the history of present illness.   No melena, hematochezia, fevers, chills, cough.   All other systems reviewed and negative.   PHYSICAL EXAM: VS:  BP 160/72  Pulse 90  Ht 5\' 2"  (1.575 m)  Wt 132 lb (59.875 kg)  BMI 24.14 kg/m2 Well nourished, well developed, in no acute distress HEENT: normal Neck: no JVD Cardiac:  normal S1, S2; RRR; no murmur Lungs:  Decreased breath sounds bilaterally, no wheezing, rhonchi or rales Abd: soft, nontender, no hepatomegaly Ext: no edema; left groin without hematoma or bruit  Skin: warm and dry Neuro:  CNs 2-12 intact, no focal abnormalities noted  EKG:  NSR, HR 90, inf ST abnormality, somewhat more prominent than prior tracings     ASSESSMENT AND PLAN:  1. Chest Pain:   Symptoms are concerning for recurrent Class III angina.  Discussed with Dr. Excell Seltzer who also saw the patient.  She really has  no good options for repeat intervention.  At this point, we would prefer to avoid relook cath.  We will try medical Rx initially.  This was discussed with the patient.  Start Imdur 30 mg QD.  She has had some dyspnea as well.  She has apparently had some volume issues in the past as well.  Does not look volume overloaded today.  Will check a BMET and BNP today as well.    2. Coronary Artery Disease:  Continue ASA and Plavix.  Add Isosorbide as noted.  Of note, she is not on beta blocker.  Notes indicate problem with bradycardia in the past as well as COPD as the reason.    3. Hypertension:   Add Isosorbide as noted.    4. Hyperlipidemia:  Continue statin.  5. Tobacco Abuse:    Discussed importance of cessation.  She is not ready to quit.    6. COPD:   Management per pulmonary.  7. Disposition:   Follow up with me in 2 weeks.    Signed, Tereso Newcomer, PA-C  2:51 PM 07/20/2012

## 2012-07-21 ENCOUNTER — Telehealth: Payer: Self-pay | Admitting: *Deleted

## 2012-07-21 NOTE — Telephone Encounter (Signed)
Message copied by Tarri Fuller on Wed Jul 21, 2012 12:06 PM ------      Message from: [place], [place] T      Created: Wed Jul 21, 2012  8:29 AM       Potassium and kidney function look good.      Na a little low but stable.      Continue with current treatment plan.      Tereso Newcomer, PA-C  4:49 PM 06/24/2012

## 2012-07-21 NOTE — Telephone Encounter (Signed)
pt notified about lab results w/verbal understanding today. Pt has f/u on 12/19 w/PA

## 2012-07-29 ENCOUNTER — Ambulatory Visit (INDEPENDENT_AMBULATORY_CARE_PROVIDER_SITE_OTHER): Payer: Medicare Other | Admitting: Physician Assistant

## 2012-07-29 ENCOUNTER — Encounter: Payer: Self-pay | Admitting: Physician Assistant

## 2012-07-29 VITALS — BP 150/66 | HR 60 | Ht 62.0 in | Wt 133.1 lb

## 2012-07-29 DIAGNOSIS — I2510 Atherosclerotic heart disease of native coronary artery without angina pectoris: Secondary | ICD-10-CM | POA: Diagnosis not present

## 2012-07-29 DIAGNOSIS — F17200 Nicotine dependence, unspecified, uncomplicated: Secondary | ICD-10-CM

## 2012-07-29 DIAGNOSIS — R079 Chest pain, unspecified: Secondary | ICD-10-CM | POA: Diagnosis not present

## 2012-07-29 DIAGNOSIS — I10 Essential (primary) hypertension: Secondary | ICD-10-CM

## 2012-07-29 MED ORDER — ISOSORBIDE MONONITRATE ER 30 MG PO TB24: 60.0000 mg | ORAL_TABLET | Freq: Every day | ORAL | Status: DC

## 2012-07-29 NOTE — Patient Instructions (Addendum)
INCREASE IMDUR TO 60 MG DAILY; CALL WHEN YOU ARE READY FOR REFILL  FOLLOW UP WITH DR. Excell Seltzer 09/03/12

## 2012-07-29 NOTE — Progress Notes (Signed)
[address] [place], [place]  [address] Phone: [phone], Fax:  [phone]  Date:  07/29/2012   Name:  Patricia [name]   DOB:  [birth year]   MRN:  [id number]  PCP:  [name], MD  Primary Cardiologist:  Dr. Tonny Bollman  Primary Electrophysiologist:  None    History of Present Illness: Patricia [name] is a 71 y.o. female who returns for follow up.   She has a hx of CAD, status post prior PCI in 12/11 with a DES to the CFX. Severe stenosis of the RCA was noted at that time but the distal vessel was supplied by collaterals and this was treated medically. Other hx includes PAD, COPD, HL, HTN, anxiety.  She underwent PCI 06/2011 with complex PCI (POBA) of the CFX/OM bifurcation with IVUS guidance.  Echocardiogram 07/04/11: Mild LVH, EF 60-65%.   She was recently noted to have CCS class III angina.  LHC 07/05/12: dLM 20-30%, pLAD 30-40%, pCFX stent 80% mid, oOM 70%, RCA subtotally (99%) occluded with L->R collats, EF 55-65%. PCI: Cutting balloon angioplasty of the CFX. Chronic subtotal occlusion of the RCA treated medically.  I saw her 12/10 in follow up.  She noted improved symptoms for about a week after PCI.  After that, she noted increased left chest pain.  I saw her with Dr. Tonny Bollman and we added Imdur 30 mg QD.  We thought she had no good options for repeat intervention.  She is doing much better.  No further chest pain.  No significant dyspnea.  No syncope.  She still feels somewhat fatigued.  No orthopnea, PND, edema.    Labs (11/12):   Hemoglobin 12.2, potassium 4.6, creatinine 0.68, ALT 16, TC 141, HDL 71, LDL 59, TG 55.  Labs (11/13):   K 3.9, creatinine 0.59, Hgb 13.7 Labs (12/13):   Na 132, K 3.6, creatinine 0.6   Wt Readings from Last 3 Encounters:  07/29/12 133 lb 1.9 oz (60.383 kg)  07/20/12 132 lb (59.875 kg)  07/06/12 134 lb 4.2 oz (60.9 kg)     Past Medical History  Diagnosis Date  . Osteopenia   . COPD (chronic obstructive pulmonary  disease)   . Hyperlipidemia   . Thyroid nodule   . Abnormal LFTs   . Depression   . HTN (hypertension)     intolerant to ACEi (cough)  . Allergic rhinitis   . Anxiety   . Panic attacks   . PAD (peripheral artery disease)     Carotid dz as  below, and at [facility] 11/12:  critical stenosis noted just below the sheath site leading into a totally occluded SFA and a patent deep femoral artery  . Carotid artery disease     Moderate - f/u dopplers needed 11/2012  . CAD (coronary artery disease)     s/p DES to the circumflex 12/11;  06/2011: complex PCI (POBA) of the CFX/OM bifurcation with IVUS guidance; [place] s/p PTCA/cutting balloon to LCx 06/2012 (chronic subtotally occ RCA with L-R collaterals, residual nonobst LAD dz)   . Arthritis     In her thumb    Current Outpatient Prescriptions  Medication Sig Dispense Refill  . albuterol (PROVENTIL,VENTOLIN) 90 MCG/ACT inhaler Inhale 2 puffs into the lungs every 4 (four) hours as needed. Follow up with your pulmonary doctor for this medicine.      Marland Kitchen amLODipine (NORVASC) 5 MG tablet Take 1 tablet by mouth Daily.      Marland Kitchen aspirin 81 MG tablet Take  1 tablet (81 mg total) by mouth daily.      . Cholecalciferol (VITAMIN D3) 1000 UNITS CAPS Take 1,000 Units by mouth daily.      . clonazePAM (KLONOPIN) 0.5 MG tablet Take 0.5 mg by mouth 2 (two) times daily as needed. For anxiety.      . clopidogrel (PLAVIX) 75 MG tablet Take 1 tablet (75 mg total) by mouth daily.  90 tablet  3  . diphenhydrAMINE (SIMPLY SLEEP) 25 MG tablet Take 1 tablet (25 mg total) by mouth at bedtime as needed. For sleep. Do not take this and clonazepam together.  30 tablet    . isosorbide mononitrate (IMDUR) 30 MG 24 hr tablet Take 1 tablet (30 mg total) by mouth daily.  90 tablet  3  . losartan-hydrochlorothiazide (HYZAAR) 100-12.5 MG per tablet Take 1 tablet by mouth daily.  90 tablet  3  . nitroGLYCERIN (NITROSTAT) 0.4 MG SL tablet Place 0.4 mg under the tongue every 5 (five) minutes as  needed. Chest pain.      . simvastatin (ZOCOR) 20 MG tablet Take 1 tablet (20 mg total) by mouth at bedtime.  90 tablet  1  . tiotropium (SPIRIVA) 18 MCG inhalation capsule Place 1 capsule (18 mcg total) into inhaler and inhale daily.  90 capsule  3    Allergies: Allergies  Allergen Reactions  . Ace Inhibitors     cough   Social History:  The patient  reports that she has been smoking Cigarettes.  She has a 27 pack-year smoking history. She has never used smokeless tobacco. She reports that she drinks about 3.6 ounces of alcohol per week. She reports that she does not use illicit drugs.  PHYSICAL EXAM: VS:  BP 150/66  Pulse 60  Ht 5\' 2"  (1.575 m)  Wt 133 lb 1.9 oz (60.383 kg)  BMI 24.35 kg/m2 Well nourished, well developed, in no acute distress HEENT: normal Neck: no JVD Cardiac:  normal S1, S2; RRR; no murmur Lungs:  Decreased breath sounds bilaterally, no wheezing, rhonchi or rales Abd: soft, nontender, no hepatomegaly Ext: no edema   Skin: warm and dry Neuro:  CNs 2-12 intact, no focal abnormalities noted  EKG:  NSR, HR 63, normal axis, nonspecific ST-T wave changes   ASSESSMENT AND PLAN:  1. Chest Pain:   Much improved.  Continue medical Rx for her angina.  With her elevated BP and positive response to nitrates, I will increase her Imdur to 60 mg QD.    2. Coronary Artery Disease:   Continue ASA and Plavix.  Increase Isosorbide as noted.    3. Hypertension:   Increase Isosorbide as noted.    4. Tobacco Abuse:    Unfortunately, she continues to smoke.    5. Disposition:   Follow up with Dr. Excell Seltzer next month as planned.  Signed, Tereso Newcomer, PA-C  2:39 PM 07/29/2012

## 2012-08-17 ENCOUNTER — Telehealth: Payer: Self-pay | Admitting: Internal Medicine

## 2012-08-17 DIAGNOSIS — N390 Urinary tract infection, site not specified: Secondary | ICD-10-CM

## 2012-08-17 MED ORDER — CIPROFLOXACIN HCL 500 MG PO TABS: 500.0000 mg | ORAL_TABLET | Freq: Two times a day (BID) | ORAL | Status: DC

## 2012-08-17 NOTE — Telephone Encounter (Signed)
Patient Information:  Caller Name: Porschea  Phone: [phone]  Patient: Patricia [name]  Gender: Female  DOB: September 05, 1940  Age: 72 Years  PCP: [name]  Office Follow Up:  Does the office need to follow up with this patient?: Yes  Instructions For The Office: OFFICE PLEASE FOLLOW UP WITH PATIENT IF PROVIDER IS WILLING TO CALL IN ANTIBIOTIC WITHOUT AN OFFICE VISIT.  PT SYMPTOM IS CLOUDY URINE.  NO PAIN, NO FEVER, NO FREQUENCY/URGENCY/BURNING OF URINATION.  PT DOES NOT HAVE ANY BACK PAIN OR BLOOD IN URINE  RN Note:  Pt declined office visit.  Pt states she has COPD and should not get out in this weather.  Pt is requesting antibiotic to be sent into pharmacy ([facility] off [address])  Symptoms  Reason For Call & Symptoms: pt reports she has a bladder infection.  Pt reports cloudy urine is main symptom.  No burning, no urgency, no frequency  Reviewed Health History In EMR: Yes  Reviewed Medications In EMR: Yes  Reviewed Allergies In EMR: Yes  Reviewed Surgeries / Procedures: Yes  Date of Onset of Symptoms: 08/10/2012  Treatments Tried: cranberry juice, fresh pineapple, OTC Azo  Treatments Tried Worked: No  Guideline(s) Used:  Urination Pain - Female  Disposition Per Guideline:   See Today in Office  Reason For Disposition Reached:   Age > 50 years  Advice Given:  Fluids:   Drink extra fluids. Drink 8-10 glasses of liquids a day (Reason: to produce a dilute, non-irritating urine).  Cranberry Juice:   Some people think that drinking cranberry juice may help in fighting urinary tract infections. However, there is no good research that has ever proved this.  Warm Saline SITZ Baths to Reduce Pain:  Sit in a warm saline bath for 20 minutes to cleanse the area and to reduce pain. Add 2 oz. of table salt or baking soda to a tub of water.  Call Back If:  You become worse.  Patient Refused Recommendation:  Patient Requests Prescription  pt is requesting antibiotic for symptoms without  office visit

## 2012-08-17 NOTE — Telephone Encounter (Signed)
Discussed with pt, she states she will come in tomorrow to leave a urine sample, abx sent to pharmacy.

## 2012-08-17 NOTE — Telephone Encounter (Signed)
pt reports she has a bladder infection. Pt reports cloudy urine is main symptom. No burning, no urgency, no frequency. Please advise.

## 2012-08-17 NOTE — Telephone Encounter (Signed)
Please arrange for the patient to give [place] a urine sample for UA and urine culture DX UTI. Then call Cipro 500 mg one by mouth twice a day for 3 days, #6 no refills. Office visit if symptoms persist or if she gets sicker

## 2012-08-18 ENCOUNTER — Other Ambulatory Visit (INDEPENDENT_AMBULATORY_CARE_PROVIDER_SITE_OTHER): Payer: Medicare Other

## 2012-08-18 DIAGNOSIS — N390 Urinary tract infection, site not specified: Secondary | ICD-10-CM | POA: Diagnosis not present

## 2012-08-18 LAB — URINALYSIS, ROUTINE W REFLEX MICROSCOPIC
Nitrite: POSITIVE
Specific Gravity, Urine: 1.01 (ref 1.000–1.030)
pH: 5.5 (ref 5.0–8.0)

## 2012-08-20 LAB — URINE CULTURE

## 2012-09-03 ENCOUNTER — Ambulatory Visit (INDEPENDENT_AMBULATORY_CARE_PROVIDER_SITE_OTHER): Payer: Medicare Other | Admitting: Cardiovascular Disease

## 2012-09-03 ENCOUNTER — Encounter: Payer: Self-pay | Admitting: Cardiovascular Disease

## 2012-09-03 VITALS — BP 132/60 | HR 59 | Ht 62.5 in | Wt 134.4 lb

## 2012-09-03 DIAGNOSIS — R079 Chest pain, unspecified: Secondary | ICD-10-CM

## 2012-09-03 DIAGNOSIS — I2510 Atherosclerotic heart disease of native coronary artery without angina pectoris: Secondary | ICD-10-CM

## 2012-09-03 DIAGNOSIS — I6529 Occlusion and stenosis of unspecified carotid artery: Secondary | ICD-10-CM | POA: Diagnosis not present

## 2012-09-03 MED ORDER — ISOSORBIDE MONONITRATE ER 30 MG PO TB24: 30.0000 mg | ORAL_TABLET | Freq: Two times a day (BID) | ORAL | Status: DC

## 2012-09-03 NOTE — Patient Instructions (Addendum)
Your physician wants you to follow-up in: 6 MONTHS WITH DR Theodoro Parma will receive a reminder letter in the mail two months in advance. If you don't receive a letter, please call our office to schedule the follow-up appointment.   Your physician has requested that you have a carotid duplex. This test is an ultrasound of the carotid arteries in your neck. It looks at blood flow through these arteries that supply the brain with blood. Allow one hour for this exam. There are no restrictions or special instructions. SCHEDULE IN MAY

## 2012-09-03 NOTE — Progress Notes (Signed)
HPI:  72 year old woman presenting for followup evaluation. Patient has coronary artery disease. She's undergone PCI of the left circumflex and has had restenosis and a complex area of the left circumflex/obtuse marginal bifurcation. She underwent cutting balloon angioplasty in November 2013. She presents today for followup evaluation. Her angina has been stable. She takes isosorbide 30 mg twice daily and this has helped a good bit. She has CCS class II symptoms. She also has mild stable dyspnea with exertion. She's had no resting chest pain or pressure. She denies edema, palpitations, orthopnea, or PND.  Outpatient Encounter Prescriptions as of 09/03/2012  Medication Sig Dispense Refill  . albuterol (PROVENTIL,VENTOLIN) 90 MCG/ACT inhaler Inhale 2 puffs into the lungs every 4 (four) hours as needed. Follow up with your pulmonary doctor for this medicine.      Marland Kitchen amLODipine (NORVASC) 5 MG tablet Take 1 tablet by mouth Daily.      Marland Kitchen aspirin 81 MG tablet Take 1 tablet (81 mg total) by mouth daily.      . Cholecalciferol (VITAMIN D3) 1000 UNITS CAPS Take 1,000 Units by mouth daily.      . ciprofloxacin (CIPRO) 500 MG tablet Take 1 tablet (500 mg total) by mouth 2 (two) times daily.  6 tablet  0  . clonazePAM (KLONOPIN) 0.5 MG tablet Take 0.5 mg by mouth 2 (two) times daily as needed. For anxiety.      . clopidogrel (PLAVIX) 75 MG tablet Take 1 tablet (75 mg total) by mouth daily.  90 tablet  3  . diphenhydrAMINE (SIMPLY SLEEP) 25 MG tablet Take 1 tablet (25 mg total) by mouth at bedtime as needed. For sleep. Do not take this and clonazepam together.  30 tablet    . isosorbide mononitrate (IMDUR) 30 MG 24 hr tablet Take 2 tablets (60 mg total) by mouth daily.      Marland Kitchen losartan-hydrochlorothiazide (HYZAAR) 100-12.5 MG per tablet Take 1 tablet by mouth daily.  90 tablet  3  . nitroGLYCERIN (NITROSTAT) 0.4 MG SL tablet Place 0.4 mg under the tongue every 5 (five) minutes as needed. Chest pain.      .  simvastatin (ZOCOR) 20 MG tablet Take 1 tablet (20 mg total) by mouth at bedtime.  90 tablet  1  . tiotropium (SPIRIVA) 18 MCG inhalation capsule Place 1 capsule (18 mcg total) into inhaler and inhale daily.  90 capsule  3    Allergies  Allergen Reactions  . Ace Inhibitors     cough    Past Medical History  Diagnosis Date  . Osteopenia   . COPD (chronic obstructive pulmonary disease)   . Hyperlipidemia   . Thyroid nodule   . Abnormal LFTs   . Depression   . HTN (hypertension)     intolerant to ACEi (cough)  . Allergic rhinitis   . Anxiety   . Panic attacks   . PAD (peripheral artery disease)     Carotid dz as  below, and at [facility] 11/12:  critical stenosis noted just below the sheath site leading into a totally occluded SFA and a patent deep femoral artery  . Carotid artery disease     Moderate - f/u dopplers needed 11/2012  . CAD (coronary artery disease)     s/p DES to the circumflex 12/11;  06/2011: complex PCI (POBA) of the CFX/OM bifurcation with IVUS guidance; [place] s/p PTCA/cutting balloon to LCx 06/2012 (chronic subtotally occ RCA with L-R collaterals, residual nonobst LAD dz)   . Arthritis  In her thumb    ROS: Negative except as per HPI  BP 132/60  Pulse 59  Ht 5' 2.5" (1.588 m)  Wt 60.963 kg (134 lb 6.4 oz)  BMI 24.19 kg/m2  PHYSICAL EXAM: Pt is alert and oriented, NAD HEENT: normal Neck: JVP - normal, carotids 2+= with a right carotid bruit Lungs: CTA bilaterally CV: RRR without murmur or gallop Abd: soft, NT, Positive BS, no hepatomegaly Ext: no C/C/E, distal pulses intact and equal Skin: warm/dry no rash  EKG:  Normal sinus rhythm 66 beats per minute, inferior infarct age undetermined.  ASSESSMENT AND PLAN: 1. CAD, native vessel. The patient is stable with CCS class II angina. We'll continue her current medical program without changes. I would like to see her back in 6 months for followup evaluation.  2. Hypertension. Blood pressure is well  controlled on a combination of losartan, hydrochlorothiazide, and isosorbide.  3. Tobacco abuse. I had along discussion with the patient once again about the importance of complete tobacco cessation. She continues to smoke one half pack per day and I don't think she'll be a will to quit.  4. Carotid stenosis, asymptomatic. She has moderate right internal carotid artery stenosis with a carotid bruit. She's due for a repeat duplex this spring. Continue current medical program. No stroke or TIA symptoms or present.  Her followup will see her back in 6 months.  Tonny Bollman 09/03/2012 3:57 PM

## 2012-09-06 ENCOUNTER — Encounter: Payer: Self-pay | Admitting: Internal Medicine

## 2012-09-06 ENCOUNTER — Telehealth: Payer: Self-pay | Admitting: Cardiovascular Disease

## 2012-09-06 ENCOUNTER — Ambulatory Visit (INDEPENDENT_AMBULATORY_CARE_PROVIDER_SITE_OTHER): Payer: Medicare Other | Admitting: Internal Medicine

## 2012-09-06 VITALS — BP 132/68 | HR 93 | Temp 97.9°F | Ht 63.0 in | Wt 132.0 lb

## 2012-09-06 DIAGNOSIS — M899 Disorder of bone, unspecified: Secondary | ICD-10-CM | POA: Diagnosis not present

## 2012-09-06 DIAGNOSIS — Z0000 Encounter for general adult medical examination without abnormal findings: Secondary | ICD-10-CM | POA: Diagnosis not present

## 2012-09-06 DIAGNOSIS — I10 Essential (primary) hypertension: Secondary | ICD-10-CM

## 2012-09-06 DIAGNOSIS — M949 Disorder of cartilage, unspecified: Secondary | ICD-10-CM

## 2012-09-06 DIAGNOSIS — J449 Chronic obstructive pulmonary disease, unspecified: Secondary | ICD-10-CM | POA: Diagnosis not present

## 2012-09-06 DIAGNOSIS — J4489 Other specified chronic obstructive pulmonary disease: Secondary | ICD-10-CM

## 2012-09-06 DIAGNOSIS — E041 Nontoxic single thyroid nodule: Secondary | ICD-10-CM

## 2012-09-06 DIAGNOSIS — E785 Hyperlipidemia, unspecified: Secondary | ICD-10-CM

## 2012-09-06 DIAGNOSIS — F17200 Nicotine dependence, unspecified, uncomplicated: Secondary | ICD-10-CM

## 2012-09-06 DIAGNOSIS — F419 Anxiety disorder, unspecified: Secondary | ICD-10-CM

## 2012-09-06 DIAGNOSIS — F341 Dysthymic disorder: Secondary | ICD-10-CM

## 2012-09-06 DIAGNOSIS — R079 Chest pain, unspecified: Secondary | ICD-10-CM

## 2012-09-06 MED ORDER — ZOSTER VACCINE LIVE 19400 UNT/0.65ML ~~LOC~~ SOLR: 0.6500 mL | Freq: Once | SUBCUTANEOUS | Status: DC

## 2012-09-06 MED ORDER — ISOSORBIDE MONONITRATE ER 30 MG PO TB24: 30.0000 mg | ORAL_TABLET | Freq: Two times a day (BID) | ORAL | Status: DC

## 2012-09-06 NOTE — Progress Notes (Signed)
Subjective:    Patient ID: Patricia [name], female    DOB: April 23, 1941, 72 y.o.   MRN: [id number]  HPI  Here for Medicare AWV:  1. Risk factors based on Past M, S, F history: reviewed  2. Physical Activities: s/p pulmonary rehab, stays active at home  3. Depression/mood: (-) screening  4. Hearing: no problems reported or noted  5. ADL's: totally independent , still drives  6. Fall Risk: no recent falls , see instructions  7. Home Safety: does feel safe at home  8. Height, weight, &visual acuity:see VS, vision seems very good to patient . Uses glasses, has not seen the eye doctor lately, rec a regular check up 9. Counseling: yes  10. Labs ordered based on risk factors: yes  11. Referral Coordination, if needed  12. Care Plan, see a/p  13. Cognitive Assessment: cognition, moptor skills and memory seem appropiate   in addition, we discussed the following COPD, good compliance of medication, essentially not using albuterol. Anxiety, not an issue at the present time, takes clonazepam mostly to help her sleep. CAD, good compliance with medications including Plavix. Hypertension, good medication compliance, ambulatory BPs in the 150s.   Past Medical History  Diagnosis Date  . Osteopenia   . COPD (chronic obstructive pulmonary disease)   . Hyperlipidemia   . Thyroid nodule   . Abnormal LFTs   . Depression   . HTN (hypertension)     intolerant to ACEi (cough)  . Allergic rhinitis   . Anxiety   . Panic attacks   . PAD (peripheral artery disease)     Carotid dz as  below, and at [facility] 11/12:  critical stenosis noted just below the sheath site leading into a totally occluded SFA and a patent deep femoral artery  . Carotid artery disease     Moderate - f/u dopplers needed 11/2012  . CAD (coronary artery disease)     s/p DES to the circumflex 12/11;  06/2011: complex PCI (POBA) of the CFX/OM bifurcation with IVUS guidance; [place] s/p PTCA/cutting balloon to LCx 06/2012 (chronic subtotally occ  RCA with L-R collaterals, residual nonobst LAD dz)   . Arthritis     In her thumb   Past Surgical History  Procedure Date  . Coronary angioplasty with stent placement     "1; makes total of 3" (07/05/2012)  . Vaginal hysterectomy ~ 1976    no oophorectomy  . 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  stroke --no  colon ca--no  breast cancer --no   Social History:  retired from office work since 2003.  Widow, 1 child , son, commited suicide 2010  Gson lives lives close by [place]--still smoking, 1/2 ppd  alcohol-- socially  Diet-- "I eat what i want"  Review of Systems Denies recent chest pain or shortness of breath. No cough or a.m. sputum production. Denies nausea, vomiting, diarrhea or blood in the stools. No dysuria gross hematuria. No vaginal bleeding.     Objective:   Physical Exam General -- alert, well-developed, and well-nourished.   Neck --no thyromegaly , no LADs Breasts-- No mass, nodules, thickening, tenderness, bulging, retraction, inflamation, nipple discharge or skin changes noted.  no axillary lymph nodes Lungs -- normal respiratory effort, no intercostal retractions,  no accessory muscle use, and decreased breath sounds.   Heart-- normal rate, regular rhythm, no murmur, and no gallop.   Abdomen--soft, non-tender, no distention, no masses  Extremities-- no pretibial edema bilaterally Neurologic-- alert & oriented X3 and strength normal in all extremities. Psych-- Cognition and judgment appear intact. Alert and cooperative with normal attention span and concentration.  not anxious appearing and not depressed appearing.       Assessment & Plan:  Recently diagnosed with a UTI, symptoms gone after she was treated with Cipro

## 2012-09-06 NOTE — Assessment & Plan Note (Signed)
Last ultrasound 11/2010, stable since 2009.

## 2012-09-06 NOTE — Patient Instructions (Signed)
Please come back fasting: FLP, AST, ALT --- dx high cholesterol   Fall Prevention and Home Safety Falls cause injuries and can affect all age groups. It is possible to use preventive measures to significantly decrease the likelihood of falls. There are many simple measures which can make your home safer and prevent falls. OUTDOORS  Repair cracks and edges of walkways and driveways.  Remove high doorway thresholds.  Trim shrubbery on the main path into your home.  Have good outside lighting.  Clear walkways of tools, rocks, debris, and clutter.  Check that handrails are not broken and are securely fastened. Both sides of steps should have handrails.  Have leaves, snow, and ice cleared regularly.  Use sand or salt on walkways during winter months.  In the garage, clean up grease or oil spills. BATHROOM  Install night lights.  Install grab bars by the toilet and in the tub and shower.  Use non-skid mats or decals in the tub or shower.  Place a plastic non-slip stool in the shower to sit on, if needed.  Keep floors dry and clean up all water on the floor immediately.  Remove soap buildup in the tub or shower on a regular basis.  Secure bath mats with non-slip, double-sided rug tape.  Remove throw rugs and tripping hazards from the floors. BEDROOMS  Install night lights.  Make sure a bedside light is easy to reach.  Do not use oversized bedding.  Keep a telephone by your bedside.  Have a firm chair with side arms to use for getting dressed.  Remove throw rugs and tripping hazards from the floor. KITCHEN  Keep handles on pots and pans turned toward the center of the stove. Use back burners when possible.  Clean up spills quickly and allow time for drying.  Avoid walking on wet floors.  Avoid hot utensils and knives.  Position shelves so they are not too high or low.  Place commonly used objects within easy reach.  If necessary, use a sturdy step stool  with a grab bar when reaching.  Keep electrical cables out of the way.  Do not use floor polish or wax that makes floors slippery. If you must use wax, use non-skid floor wax.  Remove throw rugs and tripping hazards from the floor. STAIRWAYS  Never leave objects on stairs.  Place handrails on both sides of stairways and use them. Fix any loose handrails. Make sure handrails on both sides of the stairways are as long as the stairs.  Check carpeting to make sure it is firmly attached along stairs. Make repairs to worn or loose carpet promptly.  Avoid placing throw rugs at the top or bottom of stairways, or properly secure the rug with carpet tape to prevent slippage. Get rid of throw rugs, if possible.  Have an electrician put in a light switch at the top and bottom of the stairs. OTHER FALL PREVENTION TIPS  Wear low-heel or rubber-soled shoes that are supportive and fit well. Wear closed toe shoes.  When using a stepladder, make sure it is fully opened and both spreaders are firmly locked. Do not climb a closed stepladder.  Add color or contrast paint or tape to grab bars and handrails in your home. Place contrasting color strips on first and last steps.  Learn and use mobility aids as needed. Install an electrical emergency response system.  Turn on lights to avoid dark areas. Replace light bulbs that burn out immediately. Get light switches that glow.  Arrange furniture to create clear pathways. Keep furniture in the same place.  Firmly attach carpet with non-skid or double-sided tape.  Eliminate uneven floor surfaces.  Select a carpet pattern that does not visually hide the edge of steps.  Be aware of all pets. OTHER HOME SAFETY TIPS  Set the water temperature for 120 F (48.8 C).  Keep emergency numbers on or near the telephone.  Keep smoke detectors on every level of the home and near sleeping areas. Document Released: 07/18/2002 Document Revised: 01/27/2012  Document Reviewed: 10/17/2011 [facility] Patient Information 2013 [place], [place].

## 2012-09-06 NOTE — Assessment & Plan Note (Addendum)
Td 08 had a flu shot pneumonia shot 07-18-10 shingles immunization-- issue discussed again this year , benefits discussed, Rx provided   Female exam -- last visit w/ Dr Patricia [name] September 2011.  Reluctant to go back. Never had an abnormal PAP, married once, s/p hysterectomy for fibroids. Breast exam done today. 04-2010 negative mammogram, declined to be referred for a MMG, states she will call. I strongly encouraged to do that  Never had a colonoscopy, not interested , no FH .will call if change her mind. Labs reviewed, will get a cholesterol panel-ast -alt  Counseling-- we discussed risks of tobacco and tobacco cessation. We also talked about diet

## 2012-09-06 NOTE — Assessment & Plan Note (Signed)
Currently not an issue, using clonazepam only as needed for sleep

## 2012-09-06 NOTE — Assessment & Plan Note (Signed)
Well-controlled, no change 

## 2012-09-06 NOTE — Assessment & Plan Note (Addendum)
Offered a bone density test---- will schedule on vit D

## 2012-09-06 NOTE — Assessment & Plan Note (Signed)
Advise patient to return to the office fasting for blood work

## 2012-09-06 NOTE — Telephone Encounter (Signed)
New Problem     Refill Request Isosobine mono 30 ml 1 a day to 2 day  [facility] [address]

## 2012-09-06 NOTE — Assessment & Plan Note (Signed)
Seems to be doing well, update on her immunizations

## 2012-09-06 NOTE — Assessment & Plan Note (Signed)
Counseled today.  

## 2012-09-23 ENCOUNTER — Other Ambulatory Visit: Payer: Medicare Other

## 2012-10-12 ENCOUNTER — Ambulatory Visit: Payer: Medicare Other | Admitting: Pulmonary Disease

## 2012-11-05 ENCOUNTER — Encounter: Payer: Self-pay | Admitting: Pulmonary Disease

## 2012-11-05 ENCOUNTER — Ambulatory Visit (INDEPENDENT_AMBULATORY_CARE_PROVIDER_SITE_OTHER): Payer: Medicare Other | Admitting: Pulmonary Disease

## 2012-11-05 VITALS — BP 142/72 | HR 87 | Temp 98.1°F | Ht 63.0 in | Wt 135.2 lb

## 2012-11-05 DIAGNOSIS — R918 Other nonspecific abnormal finding of lung field: Secondary | ICD-10-CM | POA: Diagnosis not present

## 2012-11-05 DIAGNOSIS — F17200 Nicotine dependence, unspecified, uncomplicated: Secondary | ICD-10-CM

## 2012-11-05 DIAGNOSIS — R9389 Abnormal findings on diagnostic imaging of other specified body structures: Secondary | ICD-10-CM

## 2012-11-05 DIAGNOSIS — J449 Chronic obstructive pulmonary disease, unspecified: Secondary | ICD-10-CM

## 2012-11-05 NOTE — Assessment & Plan Note (Signed)
We discussed screening CT scan You have to qit smoking Stay on spiriva

## 2012-11-05 NOTE — Patient Instructions (Addendum)
We discussed screening CT scan You have to qit smoking Stay on spiriva 

## 2012-11-05 NOTE — Progress Notes (Signed)
  Subjective:    Patient ID: Patricia [name], female    DOB: [birth year], 72 y.o.   MRN: [id number]  HPI 39 yowf Smoker for FU of COPD Admitted 07/02/11 with chest pain, L Heart cath revealed in stent restenosis - PCI performed with improvement in CP. Noted to have persistent hypoxemia. V/Q revealed no significant perfusion defects. CXR showed hyperinflation.  CT chest 6/09 performed for apical scarring - no nodules She was started on Advair and Spiriva. Discharged on home O2 due to exertional hypoxia.  08/29/2011  PFTs >> ratio 56, FEV1 92%, small airways 26%, no BD response, some air trapping, DLCO 66%  C/w emphysema  Did not desaturate with exertion -O2 & advair d/c 'd   She completed 3 mnths of chantix  Completed 13 sessions of rehab, Increased from 2.8 mets to 3.9    11/05/2012 44m FU   Pt reports breathing is doing well, "o2 always above 95%" denies any other concerns at this time No increased cough or wheezing. Dyspnea at baseline  She continues to smoke, we discussed smoking cessation - chantix was effective, but too expensive Does not like e-cig or nicotine replacement options No weight loss, hemoptysis or edema  Review of Systems neg for any significant sore throat, dysphagia, itching, sneezing, nasal congestion or excess/ purulent secretions, fever, chills, sweats, unintended wt loss, pleuritic or exertional cp, hempoptysis, orthopnea pnd or change in chronic leg swelling. Also denies presyncope, palpitations, heartburn, abdominal pain, nausea, vomiting, diarrhea or change in bowel or urinary habits, dysuria,hematuria, rash, arthralgias, visual complaints, headache, numbness weakness or ataxia.     Objective:   Physical Exam  Gen. Pleasant, well-nourished, in no distress ENT - no lesions, no post nasal drip Neck: No JVD, no thyromegaly, no carotid bruits Lungs: no use of accessory muscles, no dullness to percussion, clear without rales or rhonchi  Cardiovascular: Rhythm  regular, heart sounds  normal, no murmurs or gallops, no peripheral edema Musculoskeletal: No deformities, no cyanosis or clubbing        Assessment & Plan:

## 2012-11-05 NOTE — Assessment & Plan Note (Signed)
chantix was effective, but too expensive Does not like e-cig or nicotine replacement options Would like to use screening CT chest to motivate to quit. Of note, Ct chest '09 was neg - this was done for apical nodularity on CXR

## 2012-11-12 ENCOUNTER — Ambulatory Visit (INDEPENDENT_AMBULATORY_CARE_PROVIDER_SITE_OTHER)
Admission: RE | Admit: 2012-11-12 | Discharge: 2012-11-12 | Disposition: A | Discharge status: Home / Self Care | Payer: Medicare Other | Source: Ambulatory Visit | Attending: Pulmonary Disease | Admitting: Pulmonary Disease

## 2012-11-12 DIAGNOSIS — R918 Other nonspecific abnormal finding of lung field: Secondary | ICD-10-CM | POA: Diagnosis not present

## 2012-11-12 DIAGNOSIS — R9389 Abnormal findings on diagnostic imaging of other specified body structures: Secondary | ICD-10-CM

## 2012-11-12 DIAGNOSIS — J449 Chronic obstructive pulmonary disease, unspecified: Secondary | ICD-10-CM

## 2012-11-12 DIAGNOSIS — J438 Other emphysema: Secondary | ICD-10-CM | POA: Diagnosis not present

## 2012-11-15 ENCOUNTER — Other Ambulatory Visit: Payer: Self-pay | Admitting: Internal Medicine

## 2012-11-15 DIAGNOSIS — E7800 Pure hypercholesterolemia, unspecified: Secondary | ICD-10-CM

## 2012-11-15 NOTE — Telephone Encounter (Signed)
Needs labs: Arrange for FLP, AST, ALT --- dx high cholesterol ASAP. OK #30, no refill  (send to a local pharmacy )

## 2012-11-15 NOTE — Telephone Encounter (Signed)
This med has previously been refilled by Dr. Excell Seltzer. OK to refill?

## 2012-11-15 NOTE — Telephone Encounter (Signed)
Discussed with pt. Scheduled labs for 4.11.14. Refill done.

## 2012-11-19 ENCOUNTER — Other Ambulatory Visit (INDEPENDENT_AMBULATORY_CARE_PROVIDER_SITE_OTHER): Payer: Medicare Other

## 2012-11-19 DIAGNOSIS — E7800 Pure hypercholesterolemia, unspecified: Secondary | ICD-10-CM | POA: Diagnosis not present

## 2012-11-19 LAB — ALT: ALT: 15 U/L (ref 0–35)

## 2012-11-19 LAB — AST: AST: 18 U/L (ref 0–37)

## 2012-11-22 ENCOUNTER — Ambulatory Visit (INDEPENDENT_AMBULATORY_CARE_PROVIDER_SITE_OTHER)
Admission: RE | Admit: 2012-11-22 | Discharge: 2012-11-22 | Disposition: A | Discharge status: Home / Self Care | Payer: Medicare Other | Source: Ambulatory Visit | Attending: Internal Medicine | Admitting: Internal Medicine

## 2012-11-22 DIAGNOSIS — M899 Disorder of bone, unspecified: Secondary | ICD-10-CM | POA: Diagnosis not present

## 2012-11-22 DIAGNOSIS — M949 Disorder of cartilage, unspecified: Secondary | ICD-10-CM

## 2012-12-08 ENCOUNTER — Other Ambulatory Visit: Payer: Self-pay | Admitting: *Deleted

## 2012-12-08 MED ORDER — AMLODIPINE BESYLATE 5 MG PO TABS: 5.0000 mg | ORAL_TABLET | Freq: Every day | ORAL | Status: DC

## 2012-12-22 ENCOUNTER — Other Ambulatory Visit: Payer: Self-pay | Admitting: Internal Medicine

## 2012-12-22 NOTE — Telephone Encounter (Signed)
Refill done.  

## 2012-12-28 ENCOUNTER — Encounter (INDEPENDENT_AMBULATORY_CARE_PROVIDER_SITE_OTHER): Payer: Medicare Other

## 2012-12-28 DIAGNOSIS — I6529 Occlusion and stenosis of unspecified carotid artery: Secondary | ICD-10-CM | POA: Diagnosis not present

## 2013-01-06 ENCOUNTER — Encounter: Payer: Self-pay | Admitting: *Deleted

## 2013-01-06 ENCOUNTER — Telehealth: Payer: Self-pay | Admitting: Internal Medicine

## 2013-01-06 NOTE — Telephone Encounter (Signed)
Needs controlled substance contract and  UDS. Okay to refill, see prescription

## 2013-01-06 NOTE — Telephone Encounter (Signed)
Ok to refill? Last OV 1.27.14

## 2013-01-06 NOTE — Telephone Encounter (Signed)
Pt made aware rx & controlled substance contract are ready to be picked up at front desk.

## 2013-01-07 ENCOUNTER — Other Ambulatory Visit: Payer: Self-pay | Admitting: Pulmonary Disease

## 2013-01-11 ENCOUNTER — Telehealth: Payer: Self-pay | Admitting: Pulmonary Disease

## 2013-01-11 MED ORDER — TIOTROPIUM BROMIDE MONOHYDRATE 18 MCG IN CAPS: 18.0000 ug | ORAL_CAPSULE | Freq: Every day | RESPIRATORY_TRACT | Status: DC

## 2013-01-11 NOTE — Telephone Encounter (Signed)
Pt is aware rx has been sent. Nothing further was needed

## 2013-02-28 ENCOUNTER — Other Ambulatory Visit: Payer: Self-pay | Admitting: Cardiovascular Disease

## 2013-03-23 ENCOUNTER — Ambulatory Visit (INDEPENDENT_AMBULATORY_CARE_PROVIDER_SITE_OTHER): Payer: Medicare Other | Admitting: Cardiovascular Disease

## 2013-03-23 ENCOUNTER — Encounter: Payer: Self-pay | Admitting: Cardiovascular Disease

## 2013-03-23 VITALS — BP 136/76 | HR 91 | Ht 63.0 in | Wt 137.0 lb

## 2013-03-23 DIAGNOSIS — I25811 Atherosclerosis of native coronary artery of transplanted heart without angina pectoris: Secondary | ICD-10-CM

## 2013-03-23 NOTE — Progress Notes (Signed)
HPI:  72 year old woman presenting for followup evaluation. Patient has coronary artery disease. She's undergone PCI of the left circumflex and has had restenosis and a complex area of the left circumflex/obtuse marginal bifurcation. She underwent cutting balloon angioplasty in November 2013. Lipids in April 2014 showed a cholesterol of 177, triglycerides 79, HDL 68, LDL 93. She presents today for followup evaluation.  The patient is having frequent anginal symptoms. This is been responsive to isosorbide. She takes this in the morning, but by evening she is generally having anginal symptoms. She then takes a second dose as needed. Her symptoms have not progressed, but her activities are somewhat limited. She has mild shortness of breath. She continues to smoke. No orthopnea, PND, diaphoresis, nausea, or vomiting.   Outpatient Encounter Prescriptions as of 03/23/2013  Medication Sig Dispense Refill  . albuterol (PROVENTIL,VENTOLIN) 90 MCG/ACT inhaler Inhale 2 puffs into the lungs every 4 (four) hours as needed. Follow up with your pulmonary doctor for this medicine.      Marland Kitchen amLODipine (NORVASC) 5 MG tablet Take 1 tablet (5 mg total) by mouth daily.  30 tablet  6  . aspirin 81 MG tablet Take 1 tablet (81 mg total) by mouth daily.      . Cholecalciferol (VITAMIN D3) 1000 UNITS CAPS Take 1,000 Units by mouth daily.      . clonazePAM (KLONOPIN) 0.5 MG tablet TAKE ONE TABLET BY MOUTH TWICE DAILY AS NEEDED  60 tablet  3  . clopidogrel (PLAVIX) 75 MG tablet Take 1 tablet (75 mg total) by mouth daily.  90 tablet  3  . isosorbide mononitrate (IMDUR) 30 MG 24 hr tablet TAKE ONE TABLET BY MOUTH TWICE DAILY  30 tablet  0  . losartan-hydrochlorothiazide (HYZAAR) 100-12.5 MG per tablet Take 1 tablet by mouth daily.  90 tablet  3  . nitroGLYCERIN (NITROSTAT) 0.4 MG SL tablet Place 0.4 mg under the tongue every 5 (five) minutes as needed. Chest pain.      . simvastatin (ZOCOR) 20 MG tablet TAKE ONE TABLET BY MOUTH  AT BEDTIME  30 tablet  5  . tiotropium (SPIRIVA) 18 MCG inhalation capsule Place 1 capsule (18 mcg total) into inhaler and inhale daily.  30 capsule  6   No facility-administered encounter medications on file as of 03/23/2013.    Allergies  Allergen Reactions  . Ace Inhibitors     cough    Past Medical History  Diagnosis Date  . Osteopenia   . COPD (chronic obstructive pulmonary disease)   . Hyperlipidemia   . Thyroid nodule   . Abnormal LFTs   . Depression   . HTN (hypertension)     intolerant to ACEi (cough)  . Allergic rhinitis   . Anxiety   . Panic attacks   . PAD (peripheral artery disease)     Carotid dz as  below, and at [facility] 11/12:  critical stenosis noted just below the sheath site leading into a totally occluded SFA and a patent deep femoral artery  . Carotid artery disease     Moderate - f/u dopplers needed 11/2012  . CAD (coronary artery disease)     s/p DES to the circumflex 12/11;  06/2011: complex PCI (POBA) of the CFX/OM bifurcation with IVUS guidance; [place] s/p PTCA/cutting balloon to LCx 06/2012 (chronic subtotally occ RCA with L-R collaterals, residual nonobst LAD dz)   . 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  ASSESSMENT AND PLAN: 1. Coronary artery disease, native vessel. The patient has class III angina. She's taking isosorbide only once per day and the second time if needed. I asked her to take this is a scheduled twice daily drug. She will continue amlodipine. She's had difficulty tolerating  beta blockers, I think because of her lung disease. She understands to call if symptoms worsen, or to call 911 if rest pain occurs unresponsive to nitroglycerin.  2. Hypertension. Blood pressure is controlled on her current regimen.  3. Tobacco abuse. Cessation was again discussed today.  4. Carotid stenosis, asymptomatic. The patient has moderate right internal carotid artery stenosis. Her last duplex scan was in May 2014. This will be followed up in one year.  Tonny Bollman 03/28/2013 5:19 AM

## 2013-03-23 NOTE — Patient Instructions (Addendum)
Your physician wants you to follow-up in: 6 MONTHS with Dr Excell Seltzer.  You will receive a reminder letter in the mail two months in advance. If you don't receive a letter, please call our office to schedule the follow-up appointment.  Your physician recommends that you continue on your current medications as directed. Please refer to the Current Medication list given to you today. Please take your Isosorbide MN twice a day.

## 2013-03-28 ENCOUNTER — Other Ambulatory Visit: Payer: Self-pay | Admitting: Cardiovascular Disease

## 2013-03-28 ENCOUNTER — Encounter: Payer: Self-pay | Admitting: Cardiovascular Disease

## 2013-04-21 ENCOUNTER — Other Ambulatory Visit: Payer: Self-pay | Admitting: Cardiovascular Disease

## 2013-05-06 ENCOUNTER — Encounter: Payer: Self-pay | Admitting: Adult Health

## 2013-05-06 ENCOUNTER — Ambulatory Visit (INDEPENDENT_AMBULATORY_CARE_PROVIDER_SITE_OTHER): Payer: Medicare Other | Admitting: Adult Health

## 2013-05-06 VITALS — BP 122/74 | HR 67 | Temp 99.1°F | Ht 63.0 in | Wt 141.4 lb

## 2013-05-06 DIAGNOSIS — J449 Chronic obstructive pulmonary disease, unspecified: Secondary | ICD-10-CM | POA: Diagnosis not present

## 2013-05-06 NOTE — Progress Notes (Signed)
  Subjective:    Patient ID: Patricia [name], female    DOB: [birth year], 72 y.o.   MRN: [id number]  HPI  71 yowf Smoker for FU of COPD Admitted 07/02/11 with chest pain, L Heart cath revealed in stent restenosis - PCI performed with improvement in CP. Noted to have persistent hypoxemia. V/Q revealed no significant perfusion defects. CXR showed hyperinflation.  CT chest 6/09 performed for apical scarring - no nodules She was started on Advair and Spiriva. Discharged on home O2 due to exertional hypoxia.  08/29/2011  PFTs >> ratio 56, FEV1 92%, small airways 26%, no BD response, some air trapping, DLCO 66%  C/w emphysema  Did not desaturate with exertion -O2 & advair d/c 'd   She completed 3 mnths of chantix  Completed 13 sessions of rehab, Increased from 2.8 mets to 3.9    11/05/12 48m FU  Pt reports breathing is doing well, "o2 always above 95%" denies any other concerns at this time No increased cough or wheezing. Dyspnea at baseline  She continues to smoke, we discussed smoking cessation - chantix was effective, but too expensive Does not like e-cig or nicotine replacement options No weight loss, hemoptysis or edema >no change s  05/06/2013 Follow up COPD  6 month follow up COPD - reports breathing has been doing well since last ov.  would like flu shot today.  CAT = 12 Discussed smoking cessation.  No ER or hospital admits since last ov Remains active.  Feels good, with no flare in cough or wheezing.   Review of Systems  neg for any significant sore throat, dysphagia, itching, sneezing, nasal congestion or excess/ purulent secretions, fever, chills, sweats, unintended wt loss, pleuritic or exertional cp, hempoptysis, orthopnea pnd or change in chronic leg swelling. Also denies presyncope, palpitations, heartburn, abdominal pain, nausea, vomiting, diarrhea or change in bowel or urinary habits, dysuria,hematuria, rash, arthralgias, visual complaints, headache, numbness weakness or  ataxia.     Objective:   Physical Exam   Gen. Pleasant, well-nourished, in no distress ENT - no lesions, no post nasal drip Neck: No JVD, no thyromegaly, no carotid bruits Lungs: no use of accessory muscles, no dullness to percussion, clear without rales or rhonchi  Cardiovascular: Rhythm regular, heart sounds  normal, no murmurs or gallops, no peripheral edema Musculoskeletal: No deformities, no cyanosis or clubbing        Assessment & Plan:

## 2013-05-06 NOTE — Assessment & Plan Note (Signed)
Doing very well  Compensated on present regimen  Encouraged on smoking cessation   Plan  Flu shot today. Continue on Spiriva once daily. Continue to work on stopping smoking.   Follow with Dr. Vassie Loll in 1 year and as needed

## 2013-05-06 NOTE — Patient Instructions (Addendum)
Flu shot today. Continue on Spiriva once daily. Continue to work on stopping smoking.   Follow with Dr. Vassie Loll in 1 year and as needed

## 2013-05-19 ENCOUNTER — Other Ambulatory Visit: Payer: Self-pay | Admitting: Cardiovascular Disease

## 2013-06-16 ENCOUNTER — Other Ambulatory Visit: Payer: Self-pay

## 2013-06-21 ENCOUNTER — Other Ambulatory Visit: Payer: Self-pay | Admitting: Internal Medicine

## 2013-06-21 NOTE — Telephone Encounter (Signed)
Simvastatin refilled for 30 days. OV due

## 2013-07-04 ENCOUNTER — Other Ambulatory Visit: Payer: Self-pay | Admitting: Cardiovascular Disease

## 2013-07-11 ENCOUNTER — Encounter: Payer: Self-pay | Admitting: Internal Medicine

## 2013-07-11 ENCOUNTER — Ambulatory Visit (INDEPENDENT_AMBULATORY_CARE_PROVIDER_SITE_OTHER): Payer: Medicare Other | Admitting: Internal Medicine

## 2013-07-11 VITALS — BP 119/67 | HR 69 | Temp 97.7°F | Wt 142.0 lb

## 2013-07-11 DIAGNOSIS — E785 Hyperlipidemia, unspecified: Secondary | ICD-10-CM

## 2013-07-11 DIAGNOSIS — F341 Dysthymic disorder: Secondary | ICD-10-CM

## 2013-07-11 DIAGNOSIS — F329 Major depressive disorder, single episode, unspecified: Secondary | ICD-10-CM

## 2013-07-11 DIAGNOSIS — J449 Chronic obstructive pulmonary disease, unspecified: Secondary | ICD-10-CM

## 2013-07-11 DIAGNOSIS — I10 Essential (primary) hypertension: Secondary | ICD-10-CM

## 2013-07-11 MED ORDER — SIMVASTATIN 20 MG PO TABS: ORAL_TABLET | ORAL | Status: DC

## 2013-07-11 MED ORDER — CLONAZEPAM 0.5 MG PO TABS: ORAL_TABLET | ORAL | Status: DC

## 2013-07-11 NOTE — Progress Notes (Signed)
   Subjective:    Patient ID: Patricia [name], female    DOB: 1940/09/12, 72 y.o.   MRN: [id number]  HPI Routine office visit, has no concerns. Hypertension--good compliance of medications, not ambulatory BPs COPD--does not need albuterol regularly, has not used it for a while Heart disease--recently saw cardiology, felt to be stable. Smoking--still smoking, about a pack a day. All  meds reviewed, good compliance  Past Medical History  Diagnosis Date  . Osteopenia   . COPD (chronic obstructive pulmonary disease)   . Hyperlipidemia   . Thyroid nodule   . Abnormal LFTs   . Depression   . HTN (hypertension)     intolerant to ACEi (cough)  . Allergic rhinitis   . Anxiety   . Panic attacks   . PAD (peripheral artery disease)     Carotid dz as  below, and at [facility] 11/12:  critical stenosis noted just below the sheath site leading into a totally occluded SFA and a patent deep femoral artery  . Carotid artery disease     Moderate - f/u dopplers needed 11/2012  . CAD (coronary artery disease)     s/p DES to the circumflex 12/11;  06/2011: complex PCI (POBA) of the CFX/OM bifurcation with IVUS guidance; [place] s/p PTCA/cutting balloon to LCx 06/2012 (chronic subtotally occ RCA with L-R collaterals, residual nonobst LAD dz)   . 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 Coronary angioplasty with stent placement      "1; makes total of 3" (07/05/2012)  . Vaginal hysterectomy  ~ 1976    no oophorectomy  . 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 Marital Status: Widowed    Spouse Name: N/A    Number of Children: 1  . Years of Education: N/A   Occupational History  . Retired-2003    Social History Main Topics  . Smoking status: Current Every Day Smoker -- 0.50 packs/day for 54 years    Types: Cigarettes  . Smokeless tobacco:  Never Used     Comment: 07/05/2012 " smoking less since heart stent 07-2010"  . Alcohol Use: 3.6 oz/week    6 Shots of liquor per week     Comment: 07/05/2012 "gin & tonic; 2 drinks ~ 3 X/wk"  . Drug Use: No  . Sexual Activity: No   Other Topics Concern  . Not on file   Social History Narrative  . No narrative on file    Review of Systems No  CP, SOB, lower extremity edema Denies  nausea, vomiting diarrhea Denies  blood in the stools      Objective:   Physical Exam BP 119/67  Pulse 69  Temp(Src) 97.7 F (36.5 C)  Wt 142 lb (64.411 kg)  SpO2 93% General -- alert, well-developed, NAD.   Lungs -- normal respiratory effort, no intercostal retractions, no accessory muscle use, and normal breath sounds.  Heart-- normal rate, regular rhythm, no murmur.   Extremities-- no pretibial edema bilaterally  Neurologic--  alert & oriented X3. Speech normal, gait normal, strength normal in all extremities.  Psych-- Cognition and judgment appear intact. Cooperative with normal attention span and concentration. No anxious appearing , no depressed appearing.     Assessment & Plan:

## 2013-07-11 NOTE — Assessment & Plan Note (Signed)
No ambulatory BPs but seems we'll control, check a BMP and CBC

## 2013-07-11 NOTE — Assessment & Plan Note (Signed)
Ok RF , UDS today

## 2013-07-11 NOTE — Progress Notes (Signed)
Pre visit review using our clinic review tool, if applicable. No additional management support is needed unless otherwise documented below in the visit note. 

## 2013-07-11 NOTE — Assessment & Plan Note (Signed)
Controlled, check LFTs

## 2013-07-11 NOTE — Assessment & Plan Note (Addendum)
Last carotid ultrasound 12-2012 stable, next 1 year

## 2013-07-11 NOTE — Assessment & Plan Note (Addendum)
Had a flu shot, counseled about tobacco

## 2013-07-11 NOTE — Patient Instructions (Signed)
Get your blood work before you leave  Next visit for a physical exam , fasting, in 6 months Please make an appointment     If you need more information about quitting tobacco  , the American Heart Association is a great resource online at:  [employer]101.pl

## 2013-07-12 LAB — BASIC METABOLIC PANEL
Calcium: 9.5 mg/dL (ref 8.4–10.5)
GFR: 93.21 mL/min (ref >60.00)
Sodium: 138 mEq/L (ref 135–145)

## 2013-07-12 LAB — CBC WITH DIFFERENTIAL/PLATELET
Basophils Absolute: 0.1 10*3/uL (ref 0.0–0.1)
Basophils Relative: 1.3 % (ref 0.0–3.0)
Hemoglobin: 14.6 g/dL (ref 12.0–15.0)
Lymphocytes Relative: 27.5 % (ref 12.0–46.0)
Monocytes Relative: 4.6 % (ref 3.0–12.0)
Neutro Abs: 5.8 10*3/uL (ref 1.4–7.7)
Neutrophils Relative %: 65.1 % (ref 43.0–77.0)
RBC: 4.73 Mil/uL (ref 3.87–5.11)
RDW: 14 % (ref 11.5–14.6)

## 2013-07-12 LAB — AST: AST: 17 U/L (ref 0–37)

## 2013-07-13 DIAGNOSIS — Z79899 Other long term (current) drug therapy: Secondary | ICD-10-CM | POA: Diagnosis not present

## 2013-08-03 ENCOUNTER — Telehealth: Payer: Self-pay | Admitting: *Deleted

## 2013-08-03 NOTE — Telephone Encounter (Signed)
UDS collected on 07/13/13, patient had appropriate amounts of prescribed medications in patients system. Provider classified as low risk.

## 2013-08-18 ENCOUNTER — Other Ambulatory Visit: Payer: Self-pay | Admitting: Cardiovascular Disease

## 2013-09-13 ENCOUNTER — Encounter: Payer: Self-pay | Admitting: Internal Medicine

## 2013-10-02 ENCOUNTER — Other Ambulatory Visit: Payer: Self-pay | Admitting: Cardiovascular Disease

## 2013-10-05 ENCOUNTER — Other Ambulatory Visit: Payer: Self-pay

## 2013-10-05 MED ORDER — ISOSORBIDE MONONITRATE ER 30 MG PO TB24
ORAL_TABLET | ORAL | Status: DC
Start: 2013-10-05 — End: 2013-10-18

## 2013-10-12 ENCOUNTER — Ambulatory Visit: Payer: Medicare Other | Admitting: Cardiovascular Disease

## 2013-10-13 ENCOUNTER — Ambulatory Visit: Payer: Medicare Other | Admitting: Cardiovascular Disease

## 2013-10-18 ENCOUNTER — Encounter: Payer: Self-pay | Admitting: Cardiovascular Disease

## 2013-10-18 ENCOUNTER — Ambulatory Visit (INDEPENDENT_AMBULATORY_CARE_PROVIDER_SITE_OTHER): Payer: Medicare Other | Admitting: Cardiovascular Disease

## 2013-10-18 VITALS — BP 144/60 | HR 59 | Ht 63.0 in | Wt 139.1 lb

## 2013-10-18 DIAGNOSIS — I10 Essential (primary) hypertension: Secondary | ICD-10-CM | POA: Diagnosis not present

## 2013-10-18 DIAGNOSIS — R0989 Other specified symptoms and signs involving the circulatory and respiratory systems: Secondary | ICD-10-CM

## 2013-10-18 DIAGNOSIS — E785 Hyperlipidemia, unspecified: Secondary | ICD-10-CM

## 2013-10-18 DIAGNOSIS — I2510 Atherosclerotic heart disease of native coronary artery without angina pectoris: Secondary | ICD-10-CM

## 2013-10-18 MED ORDER — ISOSORBIDE MONONITRATE ER 60 MG PO TB24: ORAL_TABLET | ORAL | Status: DC

## 2013-10-18 NOTE — Patient Instructions (Signed)
Your physician has requested that you have a carotid duplex in may 2015. This test is an ultrasound of the carotid arteries in your neck. It looks at blood flow through these arteries that supply the brain with blood. Allow one hour for this exam. There are no restrictions or special instructions.   Your physician has recommended you make the following change in your medication:  INCREASE ISOSORBIDE TO 60 MG TWICE DAILY 12 HOURS APART.  Your physician recommends that you schedule a follow-up appointment in: [place] PA-C

## 2013-10-18 NOTE — Progress Notes (Signed)
HPI:  73 year old woman presenting for followup evaluation.Patient has coronary artery disease. She's undergone PCI of the left circumflex and has had restenosis and a complex area of the left circumflex/obtuse marginal bifurcation. She underwent cutting balloon angioplasty in November 2013. Lipids in April 2014 showed a cholesterol of 177, triglycerides 79, HDL 68, LDL 93. She's had significant anginal symptoms over recent years and at the time of her last visit in August 2014 we increased her isosorbide to twice daily dosing. She's been intolerant to beta blockers because of lung disease. She also has carotid stenosis with her last duplex scan in May 2014 demonstrating stable 40-59% bilateral ICA stenosis.  The patient is continuing to have problems with anginal symptoms. She has the most difficulty in the morning, when she describes a "heaviness" over her chest. She only has partial relief with isosorbide. She's been taking this more frequently than twice per day. She also has difficulty sleeping at night and sometimes awakens with anginal symptoms. Her shortness of breath is stable. She denies lightheadedness, orthopnea, or PND. She's had no leg swelling. She denies claudication symptoms. She's compliant with her medications. She continues to smoke less than one pack per day. She does not think she will ever be able to quit.   Outpatient Encounter Prescriptions as of 10/18/2013  Medication Sig  . albuterol (PROVENTIL,VENTOLIN) 90 MCG/ACT inhaler Inhale 2 puffs into the lungs every 4 (four) hours as needed.   Marland Kitchen amLODipine (NORVASC) 5 MG tablet TAKE 1 TABLET BY MOUTH EVERY DAY  . aspirin 81 MG tablet Take 1 tablet (81 mg total) by mouth daily.  . Cholecalciferol (VITAMIN D3) 1000 UNITS CAPS Take 1,000 Units by mouth daily.  . clonazePAM (KLONOPIN) 0.5 MG tablet TAKE ONE TABLET BY MOUTH TWICE DAILY AS NEEDED  . clopidogrel (PLAVIX) 75 MG tablet TAKE ONE TABLET BY MOUTH ONCE DAILY  . isosorbide  mononitrate (IMDUR) 30 MG 24 hr tablet TAKE ONE TABLET BY MOUTH TWICE DAILY  . losartan-hydrochlorothiazide (HYZAAR) 100-12.5 MG per tablet TAKE ONE TABLET BY MOUTH EVERY DAY  . Naproxen Sodium (ALEVE) 220 MG CAPS Per bottle as needed  . nitroGLYCERIN (NITROSTAT) 0.4 MG SL tablet Place 0.4 mg under the tongue every 5 (five) minutes as needed. Chest pain.  . simvastatin (ZOCOR) 20 MG tablet TAKE ONE TABLET BY MOUTH AT BEDTIME  . tiotropium (SPIRIVA) 18 MCG inhalation capsule Place 1 capsule (18 mcg total) into inhaler and inhale daily.    Allergies  Allergen Reactions  . Ace Inhibitors     cough    Past Medical History  Diagnosis Date  . Osteopenia   . COPD (chronic obstructive pulmonary disease)   . Hyperlipidemia   . Thyroid nodule   . Abnormal LFTs   . Depression   . HTN (hypertension)     intolerant to ACEi (cough)  . Allergic rhinitis   . Anxiety   . Panic attacks   . PAD (peripheral artery disease)     Carotid dz as  below, and at [facility] 11/12:  critical stenosis noted just below the sheath site leading into a totally occluded SFA and a patent deep femoral artery  . Carotid artery disease     Moderate - f/u dopplers needed 11/2012  . CAD (coronary artery disease)     s/p DES to the circumflex 12/11;  06/2011: complex PCI (POBA) of the CFX/OM bifurcation with IVUS guidance; [place] s/p PTCA/cutting balloon to LCx 06/2012 (chronic subtotally occ RCA with L-R collaterals,  residual nonobst LAD dz)   . 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  ASSESSMENT AND PLAN: 1. Coronary artery disease, native vessel with continued CCS class III anginal symptoms. The patient has complex disease of the left circumflex bifurcation. She's had multiple PCI procedures. Recommend increase isosorbide to 60 mg twice daily. I would like her to see Richardson Dopp in followup in about 4 weeks. If her angina is not improved, cardiac catheterization should be considered. I will need to review her films again carefully to evaluate treatment options since she's had multiple episodes of restenosis.  2. Hypertension. Blood pressure is controlled. Continue same medical program.  3. Hyperlipidemia. She is scheduled to have fasting lipids in the next few months with her primary care physician.  4. Carotid stenosis, asymptomatic. Due for a repeat carotid duplex scan to reassess her moderate bilateral carotid stenosis. Continue current medical therapy.  5. Tobacco abuse. Cessation counseling done.  Followup: She will see Richardson Dopp in one month. If she has improvement in her anginal symptoms I will plan on seeing her back in 6 months.  Sherren Mocha 10/18/2013 8:35 AM

## 2013-11-08 ENCOUNTER — Telehealth: Payer: Self-pay | Admitting: Cardiovascular Disease

## 2013-11-08 NOTE — Telephone Encounter (Signed)
New message    patient calling like to discuss having another stent placement.

## 2013-11-08 NOTE — Telephone Encounter (Signed)
Spoke with patient who states increased dosage of Isosorbide is helping but patient is concerned because she states that she knows the underlying problem is still present - states she would like to know if Dr. Burt Knack recommends she proceed with another stent.  I advised patient that I will forward message to Dr. Burt Knack for his review and that I will be back in touch with his advice.  Patient verbalized agreement and understanding and states she knows that this is not emergent.

## 2013-11-16 NOTE — Telephone Encounter (Signed)
Pt was called and informed that I will forward msg to dr/nurse and will receive a call back later this week, pt was accepting plan.

## 2013-11-16 NOTE — Telephone Encounter (Signed)
Follow up     Want to know if nurse talked to Dr Burt Knack about having another stent procedure.

## 2013-11-18 ENCOUNTER — Other Ambulatory Visit: Payer: Self-pay | Admitting: Cardiovascular Disease

## 2013-11-18 NOTE — Telephone Encounter (Signed)
Follow up     Pt have not heard from nurse regarding having another stent

## 2013-11-18 NOTE — Telephone Encounter (Signed)
Spoke with patient again. She continues to have a good bit of angina. She is frustrated with her quality of life. Have recommended repeat cardiac catheterization and possible PCI. I reviewed her cardiac cath films once again. She has a complex area involving the left circumflex/first obtuse marginal bifurcation as well as subtotal occlusion of the RCA. Her LAD has had only minimal disease. Will consider PCI of the right coronary artery and left circumflex depending on findings at cardiac cath. She is on maximal medical therapy.  Sherren Mocha 11/18/2013 6:08 PM

## 2013-11-21 ENCOUNTER — Ambulatory Visit: Payer: Medicare Other | Admitting: Physician Assistant

## 2013-11-21 ENCOUNTER — Encounter (HOSPITAL_COMMUNITY): Payer: Self-pay | Admitting: Pharmacy Technician

## 2013-11-21 NOTE — Telephone Encounter (Signed)
I spoke with the pt and scheduled her for L/C/P on 11/28/13.  Pt aware of pre-procedure instructions.

## 2013-11-28 ENCOUNTER — Ambulatory Visit (HOSPITAL_COMMUNITY)
Admission: RE | Admit: 2013-11-28 | Discharge: 2013-11-28 | Disposition: A | Discharge status: Home / Self Care | Payer: Medicare Other | Source: Ambulatory Visit | Attending: Cardiovascular Disease | Admitting: Cardiovascular Disease

## 2013-11-28 ENCOUNTER — Encounter (HOSPITAL_COMMUNITY)
Admission: RE | Disposition: A | Discharge status: Home / Self Care | Payer: Self-pay | Source: Ambulatory Visit | Attending: Cardiovascular Disease

## 2013-11-28 DIAGNOSIS — J449 Chronic obstructive pulmonary disease, unspecified: Secondary | ICD-10-CM | POA: Insufficient documentation

## 2013-11-28 DIAGNOSIS — I359 Nonrheumatic aortic valve disorder, unspecified: Secondary | ICD-10-CM

## 2013-11-28 DIAGNOSIS — F3289 Other specified depressive episodes: Secondary | ICD-10-CM | POA: Diagnosis not present

## 2013-11-28 DIAGNOSIS — Z7902 Long term (current) use of antithrombotics/antiplatelets: Secondary | ICD-10-CM | POA: Insufficient documentation

## 2013-11-28 DIAGNOSIS — J309 Allergic rhinitis, unspecified: Secondary | ICD-10-CM | POA: Diagnosis not present

## 2013-11-28 DIAGNOSIS — E785 Hyperlipidemia, unspecified: Secondary | ICD-10-CM | POA: Insufficient documentation

## 2013-11-28 DIAGNOSIS — I700 Atherosclerosis of aorta: Secondary | ICD-10-CM | POA: Insufficient documentation

## 2013-11-28 DIAGNOSIS — I2510 Atherosclerotic heart disease of native coronary artery without angina pectoris: Secondary | ICD-10-CM | POA: Diagnosis not present

## 2013-11-28 DIAGNOSIS — I209 Angina pectoris, unspecified: Secondary | ICD-10-CM | POA: Insufficient documentation

## 2013-11-28 DIAGNOSIS — I10 Essential (primary) hypertension: Secondary | ICD-10-CM | POA: Insufficient documentation

## 2013-11-28 DIAGNOSIS — Y9210 Unspecified residential institution as the place of occurrence of the external cause: Secondary | ICD-10-CM | POA: Insufficient documentation

## 2013-11-28 DIAGNOSIS — F411 Generalized anxiety disorder: Secondary | ICD-10-CM | POA: Diagnosis not present

## 2013-11-28 DIAGNOSIS — M949 Disorder of cartilage, unspecified: Secondary | ICD-10-CM

## 2013-11-28 DIAGNOSIS — E041 Nontoxic single thyroid nodule: Secondary | ICD-10-CM | POA: Diagnosis not present

## 2013-11-28 DIAGNOSIS — I739 Peripheral vascular disease, unspecified: Secondary | ICD-10-CM | POA: Diagnosis not present

## 2013-11-28 DIAGNOSIS — M899 Disorder of bone, unspecified: Secondary | ICD-10-CM | POA: Diagnosis not present

## 2013-11-28 DIAGNOSIS — IMO0002 Reserved for concepts with insufficient information to code with codable children: Secondary | ICD-10-CM | POA: Diagnosis not present

## 2013-11-28 DIAGNOSIS — J4489 Other specified chronic obstructive pulmonary disease: Secondary | ICD-10-CM | POA: Insufficient documentation

## 2013-11-28 DIAGNOSIS — I6529 Occlusion and stenosis of unspecified carotid artery: Secondary | ICD-10-CM | POA: Diagnosis not present

## 2013-11-28 DIAGNOSIS — F329 Major depressive disorder, single episode, unspecified: Secondary | ICD-10-CM | POA: Insufficient documentation

## 2013-11-28 DIAGNOSIS — Y849 Medical procedure, unspecified as the cause of abnormal reaction of the patient, or of later complication, without mention of misadventure at the time of the procedure: Secondary | ICD-10-CM | POA: Insufficient documentation

## 2013-11-28 DIAGNOSIS — F17200 Nicotine dependence, unspecified, uncomplicated: Secondary | ICD-10-CM | POA: Insufficient documentation

## 2013-11-28 DIAGNOSIS — Z9861 Coronary angioplasty status: Secondary | ICD-10-CM | POA: Insufficient documentation

## 2013-11-28 DIAGNOSIS — F410 Panic disorder [episodic paroxysmal anxiety] without agoraphobia: Secondary | ICD-10-CM | POA: Diagnosis not present

## 2013-11-28 HISTORY — PX: LEFT HEART CATHETERIZATION WITH CORONARY ANGIOGRAM: SHX5451

## 2013-11-28 LAB — PROTIME-INR
INR: 1.03 (ref 0.00–1.49)
Prothrombin Time: 13.3 seconds (ref 11.6–15.2)

## 2013-11-28 LAB — BASIC METABOLIC PANEL
BUN: 16 mg/dL (ref 6–23)
CHLORIDE: 99 meq/L (ref 96–112)
CO2: 26 mEq/L (ref 19–32)
CREATININE: 0.5 mg/dL (ref 0.50–1.10)
Calcium: 9.5 mg/dL (ref 8.4–10.5)
GFR calc non Af Amer: >90 mL/min (ref >90)
Glucose, Bld: 100 mg/dL — ABNORMAL HIGH (ref 70–99)
POTASSIUM: 3.8 meq/L (ref 3.7–5.3)
SODIUM: 138 meq/L (ref 137–147)

## 2013-11-28 LAB — CBC
HCT: 44.2 % (ref 36.0–46.0)
Hemoglobin: 14.9 g/dL (ref 12.0–15.0)
MCH: 30.2 pg (ref 26.0–34.0)
MCHC: 33.7 g/dL (ref 30.0–36.0)
MCV: 89.7 fL (ref 78.0–100.0)
Platelets: 200 10*3/uL (ref 150–400)
RBC: 4.93 MIL/uL (ref 3.87–5.11)
RDW: 13.1 % (ref 11.5–15.5)
WBC: 7.6 10*3/uL (ref 4.0–10.5)

## 2013-11-28 SURGERY — LEFT HEART CATHETERIZATION WITH CORONARY ANGIOGRAM
Anesthesia: LOCAL

## 2013-11-28 MED ORDER — SODIUM CHLORIDE 0.9 % IV SOLN: 250.0000 mL | INTRAVENOUS | Status: DC | PRN

## 2013-11-28 MED ORDER — HEPARIN SODIUM (PORCINE) 1000 UNIT/ML IJ SOLN
INTRAMUSCULAR | Status: AC
  Filled 2013-11-28: qty 1

## 2013-11-28 MED ORDER — ASPIRIN 81 MG PO CHEW
81.0000 mg | CHEWABLE_TABLET | ORAL | Status: AC
  Administered 2013-11-28: 81 mg via ORAL
  Filled 2013-11-28: qty 1

## 2013-11-28 MED ORDER — NITROGLYCERIN 0.2 MG/ML ON CALL CATH LAB
INTRAVENOUS | Status: AC
  Filled 2013-11-28: qty 1

## 2013-11-28 MED ORDER — FENTANYL CITRATE 0.05 MG/ML IJ SOLN
INTRAMUSCULAR | Status: AC
  Filled 2013-11-28: qty 2

## 2013-11-28 MED ORDER — SODIUM CHLORIDE 0.9 % IJ SOLN: 3.0000 mL | INTRAMUSCULAR | Status: DC | PRN

## 2013-11-28 MED ORDER — DIAZEPAM 5 MG PO TABS
5.0000 mg | ORAL_TABLET | Freq: Once | ORAL | Status: AC
  Administered 2013-11-28: 5 mg via ORAL

## 2013-11-28 MED ORDER — DIAZEPAM 5 MG PO TABS
ORAL_TABLET | ORAL | Status: AC
  Filled 2013-11-28: qty 1

## 2013-11-28 MED ORDER — VERAPAMIL HCL 2.5 MG/ML IV SOLN
INTRAVENOUS | Status: AC
  Filled 2013-11-28: qty 2

## 2013-11-28 MED ORDER — SODIUM CHLORIDE 0.9 % IV SOLN: INTRAVENOUS | Status: DC

## 2013-11-28 MED ORDER — SODIUM CHLORIDE 0.9 % IJ SOLN: 3.0000 mL | Freq: Two times a day (BID) | INTRAMUSCULAR | Status: DC

## 2013-11-28 MED ORDER — LIDOCAINE HCL (PF) 1 % IJ SOLN
INTRAMUSCULAR | Status: AC
  Filled 2013-11-28: qty 30

## 2013-11-28 MED ORDER — MIDAZOLAM HCL 2 MG/2ML IJ SOLN
INTRAMUSCULAR | Status: AC
  Filled 2013-11-28: qty 2

## 2013-11-28 MED ORDER — ACETAMINOPHEN 325 MG PO TABS: 650.0000 mg | ORAL_TABLET | ORAL | Status: DC | PRN

## 2013-11-28 MED ORDER — ONDANSETRON HCL 4 MG/2ML IJ SOLN: 4.0000 mg | Freq: Four times a day (QID) | INTRAMUSCULAR | Status: DC | PRN

## 2013-11-28 MED ORDER — HEPARIN (PORCINE) IN NACL 2-0.9 UNIT/ML-% IJ SOLN
INTRAMUSCULAR | Status: AC
Start: 2013-11-28 — End: 2013-11-28
  Filled 2013-11-28: qty 1000

## 2013-11-28 NOTE — Progress Notes (Signed)
Barbaraann Rondo from cath lab came to reassess [facility] for the second time.  Barbaraann Rondo removed TRband and will hold manual pressure for at least 15 minutes.  Will continue to monitor patient.

## 2013-11-28 NOTE — Interval H&P Note (Signed)
History and Physical Interval Note:  11/28/2013 9:48 AM  Patricia [name]  has presented today for surgery, with the diagnosis of CAD and Angina  The various methods of treatment have been discussed with the patient and family. After consideration of risks, benefits and other options for treatment, the patient has consented to  Procedure(s): LEFT HEART CATHETERIZATION WITH CORONARY ANGIOGRAM (N/A) as a surgical intervention .  The patient's history has been reviewed, patient examined, no change in status, stable for surgery.  I have reviewed the patient's chart and labs.  Questions were answered to the patient's satisfaction.    Cath Lab Visit (complete for each Cath Lab visit)  Clinical Evaluation Leading to the Procedure:   ACS: no  Non-ACS:    Anginal Classification: CCS III  Anti-ischemic medical therapy: Maximal Therapy (2 or more classes of medications)  Non-Invasive Test Results: No non-invasive testing performed  Prior CABG: No previous CABG       Sherren Mocha

## 2013-11-28 NOTE — CV Procedure (Signed)
Cardiac Catheterization Procedure Note  Name: Emri Sample MRN: [id number] DOB: [birth year]  Procedure: Left Heart Cath, Selective Coronary Angiography, LV angiography  Indication: CCS Class 3 angina, max med Rx. Known CAD with prior PCI and episodes of recurrent ISR.   Procedural Details: The right wrist was prepped, draped, and anesthetized with 1% lidocaine. Using the modified Seldinger technique, a 5 French sheath was introduced into the right radial artery. 3 mg of verapamil was administered through the sheath, weight-based unfractionated heparin was administered intravenously. Standard Judkins catheters were used for selective coronary angiography and left ventriculography. Catheter exchanges were performed over an exchange length guidewire. There were no immediate procedural complications. A TR band was used for radial hemostasis at the completion of the procedure.  The patient was transferred to the post catheterization recovery area for further monitoring.  Procedural Findings: Hemodynamics: AO 116/43 LV 116/8  Coronary angiography: Coronary dominance: right  Left mainstem: The left main is moderately calcified. The vessel is widely patent without stenosis. It divides into the LAD and left circumflex.  Left anterior descending (LAD): The LAD is heavily calcified throughout its course. Despite the heavy calcification, there is no significant obstructive disease. The vessel has 20-30% diffuse proximal stenosis. The mid and distal vessel are patent. The LAD wraps around the LV apex. There are no significant diagonals arising from the LAD.  Left circumflex (LCx): The left circumflex is patent. There is heavy calcification. The stented segment of the left circumflex has hypodense 50% in-stent restenosis involving the origin of the first obtuse marginal branch. The remaining portions of the circumflex are patent without significant stenosis. The second OM divides into twin vessels. The  first obtuse marginal which arises from the stented segment, has 50% ostial stenosis and 90% proximal vessel stenosis. This is a medium caliber vessel.  Right coronary artery (RCA): The right coronary artery has very severe diffuse calcification. The vessel has 90% ostial stenosis followed by severe diffuse mid vessel stenosis. There is to and fro flow in the distal vessel from competing left or right collaterals. The PDA is large in caliber and fills from extensive septal collaterals.  Left ventriculography: Left ventricular systolic function is normal, LVEF is estimated at 55-65%, there is no significant mitral regurgitation   Aortic arch angiography: There is severe calcification of the entire aortic arch. This extends into the ascending aorta. There is severe calcification of the great vessels.  Final Conclusions:   1. Severe 2 vessel coronary artery disease with chronic severe stenosis of the right coronary artery collateralized from the LCA and severe stenosis of the first OM branch of the circumflex 2. Calcified but nonobstructive LAD stenosis 3. Preserved LV systolic function 4. Severe aortic calcification  Recommendations: Unfortunately I think the patient's options are somewhat limited. PCI of the OM branch of the circumflex is feasible, but would involve crossing through the stent struts and dealing with diffuse disease in the left circumflex/obtuse marginal bifurcation. The right coronary artery has a very long segment of severely calcified stenosis and again could potentially be approached from a percutaneous standpoint but this would also be complex and involves significant risk. Coronary bypass surgery could be considered, but this patient is a heavy smoker and has an extremely calcified aorta raising concern for her perioperative risk of stroke from aortic pathology. I think the best initial approach at therapy is continued medical management. If she desires I will send her for a  cardiac surgical opinion.  Sherren Mocha 11/28/2013, 10:47  AM

## 2013-11-28 NOTE — H&P (Signed)
History and Physical  Patient ID: Patricia [name] MRN: [id number], SOB: 1941-02-25 73 y.o. Date of Encounter: 11/28/2013, 8:52 AM  Primary Physician: Kathlene November, MD Primary Cardiologist: Dr Burt Knack  Chief Complaint: Chest pain  HPI: 73 y.o. female w/ PMHx significant for CAD who presented to [facility] on 11/28/2013 for elective cardiac catheterization. Patient has coronary artery disease. She's undergone PCI of the left circumflex and has had restenosis and a complex area of the left circumflex/obtuse marginal bifurcation. She also has known subtotal occlusion of the right coronary artery collateralized from septal perforators of the LAD. She underwent cutting balloon angioplasty in November 2013.  The patient continues to have marked angina on a daily basis. She describes a "heaviness" across the chest. She has partial relief with isosorbide. Sometimes he is awakened from sleep with anginal symptoms. She continues to smoke cigarettes. She denies claudication symptoms. With continued symptoms of angina despite maximal medical therapy, we have elected to proceed with cardiac catheterization and possible PCI.   Past Medical History  Diagnosis Date  . Osteopenia   . COPD (chronic obstructive pulmonary disease)   . Hyperlipidemia   . Thyroid nodule   . Abnormal LFTs   . Depression   . HTN (hypertension)     intolerant to ACEi (cough)  . Allergic rhinitis   . Anxiety   . Panic attacks   . PAD (peripheral artery disease)     Carotid dz as  below, and at [facility] 11/12:  critical stenosis noted just below the sheath site leading into a totally occluded SFA and a patent deep femoral artery  . Carotid artery disease     Moderate - f/u dopplers needed 11/2012  . CAD (coronary artery disease)     s/p DES to the circumflex 12/11;  06/2011: complex PCI (POBA) of the CFX/OM bifurcation with IVUS guidance; [place] s/p PTCA/cutting balloon to LCx 06/2012 (chronic subtotally occ RCA with L-R  collaterals, residual nonobst LAD dz)   . Arthritis     In her thumb     Surgical History:  Past Surgical History  Procedure Laterality Date  . Coronary angioplasty with stent placement      "1; makes total of 3" (07/05/2012)  . Vaginal hysterectomy  ~ 1976    no oophorectomy  . 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 Authorizing Provider  albuterol (PROVENTIL,VENTOLIN) 90 MCG/ACT inhaler Inhale 2 puffs into the lungs every 4 (four) hours as needed for wheezing.  07/09/11  Yes Dayna N Dunn, PA-C  amLODipine (NORVASC) 5 MG tablet Take 5 mg by mouth daily.   Yes Historical Provider, MD  aspirin 81 MG tablet Take 1 tablet (81 mg total) by mouth daily. 07/09/11  Yes Dayna N Dunn, PA-C  Cholecalciferol (VITAMIN D3) 1000 UNITS CAPS Take 1,000 Units by mouth daily.   Yes Historical Provider, MD  clonazePAM (KLONOPIN) 0.5 MG tablet Take 0.5 mg by mouth 2 (two) times daily as needed for anxiety.   Yes Historical Provider, MD  clopidogrel (PLAVIX) 75 MG tablet Take 75 mg by mouth daily with breakfast.   Yes Historical Provider, MD  isosorbide mononitrate (IMDUR) 60 MG 24 hr tablet Take 60 mg by mouth 2 (two) times daily.   Yes Historical Provider, MD  losartan-hydrochlorothiazide (HYZAAR) 100-12.5 MG per tablet Take 1 tablet by mouth daily.   Yes Historical Provider, MD  Naproxen Sodium (ALEVE) 220 MG CAPS Take 2 capsules by mouth daily as needed (for pain). Per bottle as needed   Yes Historical Provider, MD  nitroGLYCERIN (NITROSTAT) 0.4 MG SL tablet Place 0.4 mg under the tongue every 5 (five) minutes as needed. Chest pain.   Yes Historical Provider, MD  simvastatin (ZOCOR) 20 MG tablet Take 20 mg by mouth daily.   Yes Historical Provider, MD  tiotropium (SPIRIVA) 18 MCG inhalation capsule Place 1 capsule (18  mcg total) into inhaler and inhale daily. 01/11/13 01/11/14 Yes Rigoberto Noel, MD    Allergies:  Allergies  Allergen Reactions  . Ace Inhibitors     cough    History   Social History  . Marital Status: Widowed    Spouse Name: N/A    Number of Children: 1  . Years of Education: N/A   Occupational History  . Retired-2003    Social History Main Topics  . Smoking status: Current Every Day Smoker -- 0.50 packs/day for 54 years    Types: Cigarettes  . Smokeless tobacco: Never Used     Comment: 07/05/2012 " smoking less since heart stent 07-2010"  . Alcohol Use: 3.6 oz/week    6 Shots of liquor per week     Comment: 07/05/2012 "gin & tonic; 2 drinks ~ 3 X/wk"  . Drug Use: No  . Sexual Activity: No   Other Topics Concern  . Not on file   Social History Narrative  . No narrative on file     Family History  Problem Relation Age of Onset  . Coronary artery disease Father   . Heart attack Father   . Hypertension Mother   . Stroke Neg Hx   . Colon cancer Neg Hx   . Breast cancer Neg Hx   . Heart attack Brother     Review of Systems: Negative except as per history of present illness  Physical Exam: General: Well developed, well nourished, alert and oriented, in no acute distress. HEENT: Normocephalic, atraumatic, sclera non-icteric, no xanthomas, nares are without discharge.  Neck: Supple. Carotids 2+ =. JVP normal Lungs: Clear bilaterally to auscultation without wheezes, rales, or rhonchi. Breathing is unlabored. Heart: RRR with normal S1 and S2. No murmurs, rubs, or gallops appreciated. Abdomen: Soft, non-tender, non-distended with normoactive bowel sounds. No hepatomegaly. No rebound/guarding. No obvious abdominal masses. Back: No CVA tenderness Msk:  Strength and tone appear normal for age. Extremities: No clubbing, cyanosis, or edema.  Radial pulses 2+  Neuro: CNII-XII intact, moves all extremities spontaneously. Psych:  Responds to questions appropriately with a  normal affect.   Labs:   Lab Results  Component Value Date   WBC 7.6 11/28/2013   HGB 14.9 11/28/2013   HCT 44.2 11/28/2013   MCV 89.7 11/28/2013   PLT 200 11/28/2013    Recent Labs Lab 11/28/13 0748  NA 138  K 3.8  CL 99  CO2 26  BUN 16  CREATININE 0.50  CALCIUM 9.5  GLUCOSE 100*   No results found for this basename: CKTOTAL, CKMB, TROPONINI,  in the last 72 hours Lab Results  Component Value Date   CHOL 177 11/19/2012   HDL 68.30 11/19/2012   LDLCALC 93 11/19/2012   TRIG 79.0 11/19/2012   No results found for this basename: DDIMER    Radiology/Studies:  No results found.   EKG: Normal sinus rhythm  97 beats per minute, nonspecific T wave abnormality.  ASSESSMENT AND PLAN:  73 year old woman with peripheral arterial disease, long-standing tobacco use, and known CAD. She presents with CCS class III anginal symptoms on maximal doses of 2 antianginal drugs. She has persistent symptoms despite frequent use of sublingual and long-acting nitrates. I have reviewed our plans for cardiac catheterization and PCI. Have had extensive discussion with the patient before the procedure and she understands that there may not be much else we can do from a percutaneous standpoint. Her left circumflex has complex bifurcation of stenosis with recurrent in-stent restenosis in that area. Her right coronary artery is subtotally occluded with left to right collaterals. She is not sure that she would consider coronary bypass surgery. I advised that we will make decisions based on her current coronary anatomy as defined by cardiac catheterization. I have reviewed the specific risks, indications, and alternatives to cardiac catheterization and possible PCI. She understands the risks of major complication, such as leaving, vascular injury, stroke, myocardial infarction, emergency CABG, and death, occur at very low frequency(less than 1%). She agrees to proceed.  Deatra James  11/28/2013, 8:52  AM

## 2013-11-28 NOTE — Progress Notes (Signed)
Came over to reassess TR band and right radial site.  Slight ooze and small hematoma above site of TR band.  Removed band and held manual pressure x15 minutes then watch site for another 10 minutes.  Site with some bruising but overall looks pretty good with now no hematoma.  Dressed with 2x2 and tegaderm and had RN assess site as well.  I did ask the RN to keep the patient for 1 hour and continue to assess site until discharge.

## 2013-11-28 NOTE — Discharge Instructions (Signed)

## 2013-11-30 ENCOUNTER — Encounter: Payer: Self-pay | Admitting: Surgery

## 2013-11-30 ENCOUNTER — Institutional Professional Consult (permissible substitution) (INDEPENDENT_AMBULATORY_CARE_PROVIDER_SITE_OTHER): Payer: Medicare Other | Admitting: Surgery

## 2013-11-30 ENCOUNTER — Other Ambulatory Visit: Payer: Self-pay | Admitting: Internal Medicine

## 2013-11-30 ENCOUNTER — Telehealth: Payer: Self-pay | Admitting: *Deleted

## 2013-11-30 VITALS — BP 134/79 | HR 100 | Resp 20 | Ht 63.0 in | Wt 138.0 lb

## 2013-11-30 DIAGNOSIS — I2510 Atherosclerotic heart disease of native coronary artery without angina pectoris: Secondary | ICD-10-CM | POA: Diagnosis not present

## 2013-11-30 MED ORDER — CLONAZEPAM 0.5 MG PO TABS: 0.5000 mg | ORAL_TABLET | Freq: Two times a day (BID) | ORAL | Status: DC | PRN

## 2013-11-30 NOTE — Telephone Encounter (Signed)
Last RF 07-2013 Ok RF

## 2013-11-30 NOTE — Telephone Encounter (Signed)
Clonazepam 0.5mg   Last OV- 07/11/13 Last refilled- 11/21/13 #60 / 3 rf \ UDS- 07/13/13 LOW risk

## 2013-11-30 NOTE — Progress Notes (Signed)
PCP is Kathlene November, MD Referring Provider is Sherren Mocha, MD  Chief Complaint  Patient presents with  . Coronary Artery Disease    Surgical eval, Cardiac Cath 11/28/2013- Dr Burt Knack     HPI:  The patient is a 73 year old current smoker with COPD, hyperlipidemia, HTN, and know CAD s/p PCI of the LCX with a DES in 07/2010. In 06/2011 she underwent complex PCI of the LCX/OM bifurcation and then PTCA/cutting balloon to the LCX in 06/2012. She continues to have angina on a daily basis that seems to be the worst in the morning on awakening and is slowly resolved with NTG. Surprisingly she does not have angina with ambulation during the day. She has continued to smoke 1 ppd and has smoked for 55 years. Cardiac cath on 11/28/2013 shows the LAD to be heavily calcified throughout but with no significant stenosis. The LCX has a hypodense in-stent restenosis at the origin of the OM1 that was estimated at 50% but may be worse. The OM1 has 50% ostial and 90% proximal stenosis. There is am OM2 that has no significant disease but is compromised by the proximal LCX stenosis. The RCA is likely the culprit with 90% ostial stenosis and severe diffuse mid-vessel stenosis with to and fro flow in the distal vessel. The LVEF is normal at 55-65%. Aortic arch angiography shows severe calcification of the entire arch and the arch vessels.  Past Medical History  Diagnosis Date  . Osteopenia   . COPD (chronic obstructive pulmonary disease)   . Hyperlipidemia   . Thyroid nodule   . Abnormal LFTs   . Depression   . HTN (hypertension)     intolerant to ACEi (cough)  . Allergic rhinitis   . Anxiety   . Panic attacks   . PAD (peripheral artery disease)     Carotid dz as  below, and at [facility] 11/12:  critical stenosis noted just below the sheath site leading into a totally occluded SFA and a patent deep femoral artery  . Carotid artery disease     Moderate - f/u dopplers needed 11/2012  . CAD (coronary artery  disease)     s/p DES to the circumflex 12/11;  06/2011: complex PCI (POBA) of the CFX/OM bifurcation with IVUS guidance; [place] s/p PTCA/cutting balloon to LCx 06/2012 (chronic subtotally occ RCA with L-R collaterals, residual nonobst LAD dz)   . 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 Coronary angioplasty with stent placement      "1; makes total of 3" (07/05/2012)  . Vaginal hysterectomy  ~ 1976    no oophorectomy  . 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 Coronary artery disease Father   . Heart attack Father   . Hypertension Mother   . Stroke Neg Hx   . Colon cancer Neg Hx   . Breast cancer Neg Hx   . Heart attack Brother     Social History History  Substance Use Topics  . Smoking status: Current Every Day Smoker -- 0.50 packs/day for 54 years    Types: Cigarettes  . Smokeless tobacco: Never Used     Comment: 07/05/2012 " smoking less since heart stent 07-2010"  . Alcohol Use: 3.6 oz/week  6 Shots of liquor per week     Comment: 07/05/2012 "gin & tonic; 2 drinks ~ 3 X/wk"    Current Outpatient Prescriptions  Medication Sig Dispense Refill  . amLODipine (NORVASC) 5 MG tablet Take 5 mg by mouth daily.      Marland Kitchen aspirin 81 MG tablet Take 1 tablet (81 mg total) by mouth daily.      . Cholecalciferol (VITAMIN D3) 1000 UNITS CAPS Take 1,000 Units by mouth daily.      . clonazePAM (KLONOPIN) 0.5 MG tablet Take 1 tablet (0.5 mg total) by mouth 2 (two) times daily as needed for anxiety.  60 tablet  3  . clopidogrel (PLAVIX) 75 MG tablet Take 75 mg by mouth daily with breakfast.      . isosorbide mononitrate (IMDUR) 60 MG 24 hr tablet Take 60 mg by mouth 2 (two) times daily.      Marland Kitchen losartan-hydrochlorothiazide (HYZAAR) 100-12.5 MG per tablet Take 1 tablet by mouth daily.      . Naproxen Sodium  (ALEVE) 220 MG CAPS Take 2 capsules by mouth daily as needed (for pain). Per bottle as needed      . nitroGLYCERIN (NITROSTAT) 0.4 MG SL tablet Place 0.4 mg under the tongue every 5 (five) minutes as needed. Chest pain.      . simvastatin (ZOCOR) 20 MG tablet Take 20 mg by mouth daily.      Marland Kitchen tiotropium (SPIRIVA) 18 MCG inhalation capsule Place 1 capsule (18 mcg total) into inhaler and inhale daily.  30 capsule  6   No current facility-administered medications for this visit.    Allergies  Allergen Reactions  . Ace Inhibitors     cough    Review of Systems  Constitutional: Positive for activity change and fatigue.  HENT: Negative.   Eyes: Negative.   Respiratory: Positive for cough and shortness of breath.   Cardiovascular: Positive for chest pain. Negative for palpitations and leg swelling.  Gastrointestinal: Negative.   Endocrine: Negative.   Genitourinary: Negative.   Musculoskeletal: Positive for arthralgias.  Allergic/Immunologic: Negative.   Neurological: Negative.   Hematological: Bruises/bleeds easily.  Psychiatric/Behavioral: The patient is nervous/anxious.     BP 134/79  Pulse 100  Resp 20  Ht 5\' 3"  (1.6 m)  Wt 138 lb (62.596 kg)  BMI 24.45 kg/m2  SpO2 97% Physical Exam  Constitutional: She is oriented to person, place, and time. She appears well-developed and well-nourished. No distress.  HENT:  Head: Normocephalic and atraumatic.  Mouth/Throat: Oropharynx is clear and moist.  Eyes: EOM are normal. Pupils are equal, round, and reactive to light.  Neck: Normal range of motion. Neck supple. No JVD present. No thyromegaly present.  Cardiovascular: Normal rate, regular rhythm, normal heart sounds and intact distal pulses.   No murmur heard. Pulmonary/Chest: Effort normal and breath sounds normal. No respiratory distress. She has no rales.  Abdominal: Soft. Bowel sounds are normal. She exhibits no distension. There is no tenderness.  Musculoskeletal: She exhibits  no edema.  Lymphadenopathy:    She has no cervical adenopathy.  Neurological: She is alert and oriented to person, place, and time. No cranial nerve deficit or sensory deficit.  Skin: Skin is warm and dry.  Psychiatric: She has a normal mood and affect.     Diagnostic Tests:  Cardiac Catheterization Procedure Note  Name: Patricia [name]  MRN: [id number]  DOB: 09/05/1940  Procedure: Left Heart Cath, Selective Coronary Angiography, LV angiography  Indication: CCS Class 3 angina, max med  Rx. Known CAD with prior PCI and episodes of recurrent ISR.  Procedural Details: The right wrist was prepped, draped, and anesthetized with 1% lidocaine. Using the modified Seldinger technique, a 5 French sheath was introduced into the right radial artery. 3 mg of verapamil was administered through the sheath, weight-based unfractionated heparin was administered intravenously. Standard Judkins catheters were used for selective coronary angiography and left ventriculography. Catheter exchanges were performed over an exchange length guidewire. There were no immediate procedural complications. A TR band was used for radial hemostasis at the completion of the procedure. The patient was transferred to the post catheterization recovery area for further monitoring.  Procedural Findings:  Hemodynamics:  AO 116/43  LV 116/8  Coronary angiography:  Coronary dominance: right  Left mainstem: The left main is moderately calcified. The vessel is widely patent without stenosis. It divides into the LAD and left circumflex.  Left anterior descending (LAD): The LAD is heavily calcified throughout its course. Despite the heavy calcification, there is no significant obstructive disease. The vessel has 20-30% diffuse proximal stenosis. The mid and distal vessel are patent. The LAD wraps around the LV apex. There are no significant diagonals arising from the LAD.  Left circumflex (LCx): The left circumflex is patent. There is heavy  calcification. The stented segment of the left circumflex has hypodense 50% in-stent restenosis involving the origin of the first obtuse marginal branch. The remaining portions of the circumflex are patent without significant stenosis. The second OM divides into twin vessels. The first obtuse marginal which arises from the stented segment, has 50% ostial stenosis and 90% proximal vessel stenosis. This is a medium caliber vessel.  Right coronary artery (RCA): The right coronary artery has very severe diffuse calcification. The vessel has 90% ostial stenosis followed by severe diffuse mid vessel stenosis. There is to and fro flow in the distal vessel from competing left or right collaterals. The PDA is large in caliber and fills from extensive septal collaterals.  Left ventriculography: Left ventricular systolic function is normal, LVEF is estimated at 55-65%, there is no significant mitral regurgitation  Aortic arch angiography: There is severe calcification of the entire aortic arch. This extends into the ascending aorta. There is severe calcification of the great vessels.  Final Conclusions:  1. Severe 2 vessel coronary artery disease with chronic severe stenosis of the right coronary artery collateralized from the LCA and severe stenosis of the first OM branch of the circumflex  2. Calcified but nonobstructive LAD stenosis  3. Preserved LV systolic function  4. Severe aortic calcification  Recommendations: Unfortunately I think the patient's options are somewhat limited. PCI of the OM branch of the circumflex is feasible, but would involve crossing through the stent struts and dealing with diffuse disease in the left circumflex/obtuse marginal bifurcation. The right coronary artery has a very long segment of severely calcified stenosis and again could potentially be approached from a percutaneous standpoint but this would also be complex and involves significant risk. Coronary bypass surgery could be  considered, but this patient is a heavy smoker and has an extremely calcified aorta raising concern for her perioperative risk of stroke from aortic pathology. I think the best initial approach at therapy is continued medical management. If she desires I will send her for a cardiac surgical opinion.  Sherren Mocha  11/28/2013, 10:47 AM     Impression:  She has severe multi-vessel coronary artery disease with unstable anginal symptoms that are very life-style limiting. The RCA and LCX branches  are graftable and would resolve her angina and improve her quality of life. She has significant calcified plaque in the distal ascending aorta and aortic arch that would increase her risk of stroke but I think we can minimize that risk. I discussed the option of continued medical therapy but I think she will have significant problems if the RCA shuts down, especially with the LCX stenoses. I discussed the operative procedure with the patient and her 2 nieces including alternatives, benefits and risks; including but not limited to bleeding, blood transfusion, infection, stroke, myocardial infarction, graft failure, heart block requiring a permanent pacemaker, organ dysfunction, and death.  Patricia [name] understands and agrees to proceed. She will look at her schedule and call us to schedule in the next couple weeks.  Plan:  The patient will call us to schedule CABG surgery.

## 2013-11-30 NOTE — Telephone Encounter (Signed)
rx faxed to [facility]

## 2013-12-01 ENCOUNTER — Other Ambulatory Visit: Payer: Self-pay | Admitting: *Deleted

## 2013-12-01 ENCOUNTER — Other Ambulatory Visit: Payer: Self-pay | Admitting: Internal Medicine

## 2013-12-01 DIAGNOSIS — I2510 Atherosclerotic heart disease of native coronary artery without angina pectoris: Secondary | ICD-10-CM

## 2013-12-01 NOTE — Telephone Encounter (Signed)
Rx refused b/c it was filled and faxed on (11-30-13).//AB/CMA

## 2013-12-04 ENCOUNTER — Other Ambulatory Visit: Payer: Self-pay | Admitting: Pulmonary Disease

## 2013-12-06 ENCOUNTER — Telehealth: Payer: Self-pay | Admitting: Pulmonary Disease

## 2013-12-06 MED ORDER — TIOTROPIUM BROMIDE MONOHYDRATE 18 MCG IN CAPS: 18.0000 ug | ORAL_CAPSULE | Freq: Every day | RESPIRATORY_TRACT | Status: DC

## 2013-12-06 NOTE — Telephone Encounter (Signed)
Rx was sent to pharm for Spiriva  Pt aware and states nothing further needed

## 2013-12-13 ENCOUNTER — Encounter (HOSPITAL_COMMUNITY)
Admission: RE | Admit: 2013-12-13 | Discharge: 2013-12-13 | Disposition: A | Discharge status: Home / Self Care | Payer: Medicare Other | Source: Ambulatory Visit | Attending: Surgery | Admitting: Surgery

## 2013-12-13 ENCOUNTER — Ambulatory Visit (HOSPITAL_COMMUNITY)
Admission: RE | Admit: 2013-12-13 | Discharge: 2013-12-13 | Disposition: A | Discharge status: Home / Self Care | Payer: Medicare Other | Source: Ambulatory Visit | Attending: Surgery | Admitting: Surgery

## 2013-12-13 ENCOUNTER — Encounter: Payer: Medicare Other | Admitting: Physician Assistant

## 2013-12-13 ENCOUNTER — Encounter (HOSPITAL_COMMUNITY): Payer: Self-pay

## 2013-12-13 VITALS — BP 126/69 | HR 64 | Temp 98.4°F | Resp 20 | Ht 63.0 in | Wt 136.7 lb

## 2013-12-13 DIAGNOSIS — I6529 Occlusion and stenosis of unspecified carotid artery: Secondary | ICD-10-CM | POA: Insufficient documentation

## 2013-12-13 DIAGNOSIS — Z01810 Encounter for preprocedural cardiovascular examination: Secondary | ICD-10-CM | POA: Diagnosis not present

## 2013-12-13 DIAGNOSIS — I658 Occlusion and stenosis of other precerebral arteries: Secondary | ICD-10-CM | POA: Insufficient documentation

## 2013-12-13 DIAGNOSIS — J449 Chronic obstructive pulmonary disease, unspecified: Secondary | ICD-10-CM | POA: Insufficient documentation

## 2013-12-13 DIAGNOSIS — I70219 Atherosclerosis of native arteries of extremities with intermittent claudication, unspecified extremity: Secondary | ICD-10-CM | POA: Insufficient documentation

## 2013-12-13 DIAGNOSIS — Z01818 Encounter for other preprocedural examination: Secondary | ICD-10-CM | POA: Diagnosis not present

## 2013-12-13 DIAGNOSIS — J4489 Other specified chronic obstructive pulmonary disease: Secondary | ICD-10-CM | POA: Insufficient documentation

## 2013-12-13 DIAGNOSIS — I2510 Atherosclerotic heart disease of native coronary artery without angina pectoris: Secondary | ICD-10-CM | POA: Diagnosis not present

## 2013-12-13 LAB — URINALYSIS, ROUTINE W REFLEX MICROSCOPIC
Bilirubin Urine: NEGATIVE
GLUCOSE, UA: NEGATIVE mg/dL
Hgb urine dipstick: NEGATIVE
Ketones, ur: NEGATIVE mg/dL
LEUKOCYTES UA: NEGATIVE
Nitrite: NEGATIVE
Protein, ur: NEGATIVE mg/dL
SPECIFIC GRAVITY, URINE: 1.014 (ref 1.005–1.030)
Urobilinogen, UA: 0.2 mg/dL (ref 0.0–1.0)
pH: 5 (ref 5.0–8.0)

## 2013-12-13 LAB — COMPREHENSIVE METABOLIC PANEL
ALK PHOS: 134 U/L — AB (ref 39–117)
ALT: 13 U/L (ref 0–35)
AST: 18 U/L (ref 0–37)
Albumin: 3.5 g/dL (ref 3.5–5.2)
BUN: 11 mg/dL (ref 6–23)
CHLORIDE: 103 meq/L (ref 96–112)
CO2: 19 mEq/L (ref 19–32)
CREATININE: 0.44 mg/dL — AB (ref 0.50–1.10)
Calcium: 9.2 mg/dL (ref 8.4–10.5)
GFR calc non Af Amer: >90 mL/min (ref >90)
Glucose, Bld: 85 mg/dL (ref 70–99)
Potassium: 4 mEq/L (ref 3.7–5.3)
Sodium: 140 mEq/L (ref 137–147)
Total Bilirubin: <0.2 mg/dL — ABNORMAL LOW (ref 0.3–1.2)
Total Protein: 7 g/dL (ref 6.0–8.3)

## 2013-12-13 LAB — CBC
HEMATOCRIT: 44.5 % (ref 36.0–46.0)
Hemoglobin: 15.3 g/dL — ABNORMAL HIGH (ref 12.0–15.0)
MCH: 30.9 pg (ref 26.0–34.0)
MCHC: 34.4 g/dL (ref 30.0–36.0)
MCV: 89.9 fL (ref 78.0–100.0)
Platelets: 232 10*3/uL (ref 150–400)
RBC: 4.95 MIL/uL (ref 3.87–5.11)
RDW: 13.3 % (ref 11.5–15.5)
WBC: 9.8 10*3/uL (ref 4.0–10.5)

## 2013-12-13 LAB — PROTIME-INR
INR: 1.06 (ref 0.00–1.49)
Prothrombin Time: 13.6 seconds (ref 11.6–15.2)

## 2013-12-13 LAB — TYPE AND SCREEN
ABO/RH(D): O POS
Antibody Screen: NEGATIVE

## 2013-12-13 LAB — BLOOD GAS, ARTERIAL
ACID-BASE EXCESS: 0.9 mmol/L (ref 0.0–2.0)
BICARBONATE: 24.4 meq/L — AB (ref 20.0–24.0)
Drawn by: 344381
FIO2: 0.21 %
O2 Saturation: 97.5 %
PATIENT TEMPERATURE: 98.6
PH ART: 7.457 — AB (ref 7.350–7.450)
TCO2: 25.4 mmol/L (ref 0–100)
pCO2 arterial: 35 mmHg (ref 35.0–45.0)
pO2, Arterial: 82.4 mmHg (ref 80.0–100.0)

## 2013-12-13 LAB — SURGICAL PCR SCREEN
MRSA, PCR: NEGATIVE
STAPHYLOCOCCUS AUREUS: NEGATIVE

## 2013-12-13 LAB — APTT: aPTT: 27 seconds (ref 24–37)

## 2013-12-13 LAB — HEMOGLOBIN A1C
HEMOGLOBIN A1C: 5.7 % — AB (ref <5.7)
Mean Plasma Glucose: 117 mg/dL — ABNORMAL HIGH (ref <117)

## 2013-12-13 LAB — ABO/RH: ABO/RH(D): O POS

## 2013-12-13 NOTE — Pre-Procedure Instructions (Signed)
Patricia [name]  12/13/2013   Your procedure is scheduled on:  Thursday, May 7th  Report to [place] at 0530 AM.  Call this number if you have problems the morning of surgery: [phone]   Remember:   Do not eat food or drink liquids after midnight.   Take these medicines the morning of surgery with A SIP OF WATER: norvasc, klonopin if needed, Spiriva   Do not wear jewelry, make-up or nail polish.  Do not wear lotions, powders, or perfumes. You may wear deodorant.  Do not shave 48 hours prior to surgery. Men may shave face and neck.  Do not bring valuables to the hospital.  [facility] is not responsible  for any belongings or valuables.               Contacts, dentures or bridgework may not be worn into surgery.  Leave suitcase in the car. After surgery it may be brought to your room.  For patients admitted to the hospital, discharge time is determined by your  treatment team.          Please read over the following fact sheets that you were given: Pain Booklet, Coughing and Deep Breathing, Blood Transfusion Information, MRSA Information and Surgical Site Infection Prevention [place] - Preparing for Surgery  Before surgery, you can play an important role.  Because skin is not sterile, your skin needs to be as free of germs as possible.  You can reduce the number of germs on you skin by washing with CHG (chlorahexidine gluconate) soap before surgery.  CHG is an antiseptic cleaner which kills germs and bonds with the skin to continue killing germs even after washing.  Please DO NOT use if you have an allergy to CHG or antibacterial soaps.  If your skin becomes reddened/irritated stop using the CHG and inform your nurse when you arrive at Short Stay.  Do not shave (including legs and underarms) for at least 48 hours prior to the first CHG shower.  You may shave your face.  Please follow these instructions carefully:   1.  Shower with CHG Soap the night before  surgery and the morning of Surgery.  2.  If you choose to wash your hair, wash your hair first as usual with your normal shampoo.  3.  After you shampoo, rinse your hair and body thoroughly to remove the shampoo.  4.  Use CHG as you would any other liquid soap.  You can apply CHG directly to the skin and wash gently with scrungie or a clean washcloth.  5.  Apply the CHG Soap to your body ONLY FROM THE NECK DOWN.  Do not use on open wounds or open sores.  Avoid contact with your eyes, ears, mouth and genitals (private parts).  Wash genitals (private parts) with your normal soap.  6.  Wash thoroughly, paying special attention to the area where your surgery will be performed.  7.  Thoroughly rinse your body with warm water from the neck down.  8.  DO NOT shower/wash with your normal soap after using and rinsing off the CHG Soap.  9.  Pat yourself dry with a clean towel.            10.  Wear clean pajamas.            11.  Place clean sheets on your bed the night of your first shower and do not sleep with pets.  Day of Surgery  Do not apply any lotions/deoderants the morning of surgery.  Please wear clean clothes to the hospital/surgery center.

## 2013-12-13 NOTE — Progress Notes (Signed)
VASCULAR LAB PRELIMINARY  PRELIMINARY  PRELIMINARY  PRELIMINARY  Pre-op Cardiac Surgery  Carotid Findings:  Right 1% to 39% ICA stenosis upper end of scale. Left - 40% to 59% ICA stenosis Bilateral vertebral artery flow is antegrade  Upper Extremity Right Left  Brachial Pressures 125 Triphasic 131 Triphasic  Radial Waveforms Triphasic Triphasic  Ulnar Waveforms Triphasic Triphasic  Palmar Arch (Allen's Test) Normal Normal   Findings: Doppler waveforms remained normal bilaterally with both radial and ulnar compressions.    Lower  Extremity Right Left  Dorsalis Pedis 75 Monophasic 110 Biphasic  Posterior Tibial 57 Monophasic 112 Biphasic to Triphasic  Ankle/Brachial Indices 0.57 0.85   Findings : ABIs on the right indicate a moderate reduction in arterial flow and the left ABIs indicate a mild reduction in arterial flow at rest.  Dareen Piano, RVS 12/13/2013, 5:21 PM

## 2013-12-14 MED ORDER — VANCOMYCIN HCL 10 G IV SOLR
1250.0000 mg | INTRAVENOUS | Status: AC
  Administered 2013-12-15: 1250 mg via INTRAVENOUS
  Filled 2013-12-14: qty 1250

## 2013-12-14 MED ORDER — SODIUM CHLORIDE 0.9 % IV SOLN
INTRAVENOUS | Status: AC
  Administered 2013-12-15: 69 mL/h via INTRAVENOUS
  Filled 2013-12-14: qty 40

## 2013-12-14 MED ORDER — EPINEPHRINE HCL 1 MG/ML IJ SOLN
0.5000 ug/min | INTRAVENOUS | Status: DC
  Filled 2013-12-14: qty 4

## 2013-12-14 MED ORDER — SODIUM CHLORIDE 0.9 % IV SOLN
INTRAVENOUS | Status: AC
  Administered 2013-12-15: 1.7 [IU]/h via INTRAVENOUS
  Filled 2013-12-14: qty 1

## 2013-12-14 MED ORDER — DEXTROSE 5 % IV SOLN
1.5000 g | INTRAVENOUS | Status: AC
  Administered 2013-12-15: .75 g via INTRAVENOUS
  Administered 2013-12-15: 1.5 g via INTRAVENOUS
  Filled 2013-12-14: qty 1.5

## 2013-12-14 MED ORDER — METOPROLOL TARTRATE 12.5 MG HALF TABLET
12.5000 mg | ORAL_TABLET | Freq: Once | ORAL | Status: AC
  Administered 2013-12-15: 12.5 mg via ORAL
  Filled 2013-12-14: qty 1

## 2013-12-14 MED ORDER — DOPAMINE-DEXTROSE 3.2-5 MG/ML-% IV SOLN
2.0000 ug/kg/min | INTRAVENOUS | Status: DC
  Filled 2013-12-14: qty 250

## 2013-12-14 MED ORDER — CHLORHEXIDINE GLUCONATE 4 % EX LIQD
30.0000 mL | CUTANEOUS | Status: DC
Start: 2013-12-14 — End: 2013-12-15
  Filled 2013-12-14: qty 30

## 2013-12-14 MED ORDER — DEXTROSE 5 % IV SOLN
30.0000 ug/min | INTRAVENOUS | Status: AC
  Administered 2013-12-15: 10 ug/min via INTRAVENOUS
  Filled 2013-12-14: qty 2

## 2013-12-14 MED ORDER — MAGNESIUM SULFATE 50 % IJ SOLN
40.0000 meq | INTRAMUSCULAR | Status: DC
  Filled 2013-12-14: qty 10

## 2013-12-14 MED ORDER — DEXTROSE 5 % IV SOLN
750.0000 mg | INTRAVENOUS | Status: DC
  Filled 2013-12-14: qty 750

## 2013-12-14 MED ORDER — POTASSIUM CHLORIDE 2 MEQ/ML IV SOLN
80.0000 meq | INTRAVENOUS | Status: DC
  Filled 2013-12-14: qty 40

## 2013-12-14 MED ORDER — NITROGLYCERIN IN D5W 200-5 MCG/ML-% IV SOLN
2.0000 ug/min | INTRAVENOUS | Status: AC
  Administered 2013-12-15: 5 ug/min via INTRAVENOUS
  Filled 2013-12-14: qty 250

## 2013-12-14 MED ORDER — HEPARIN SODIUM (PORCINE) 1000 UNIT/ML IJ SOLN
INTRAMUSCULAR | Status: AC
  Administered 2013-12-15: 09:00:00
  Filled 2013-12-14: qty 2.5

## 2013-12-14 MED ORDER — SODIUM CHLORIDE 0.9 % IV SOLN
INTRAVENOUS | Status: DC
  Filled 2013-12-14: qty 30

## 2013-12-14 MED ORDER — DEXMEDETOMIDINE HCL IN NACL 400 MCG/100ML IV SOLN
0.1000 ug/kg/h | INTRAVENOUS | Status: AC
  Administered 2013-12-15: 0.3 ug/kg/h via INTRAVENOUS
  Filled 2013-12-14: qty 100

## 2013-12-15 ENCOUNTER — Encounter (HOSPITAL_COMMUNITY): Payer: Medicare Other | Admitting: Certified Registered"

## 2013-12-15 ENCOUNTER — Encounter (HOSPITAL_COMMUNITY): Payer: Self-pay | Admitting: *Deleted

## 2013-12-15 ENCOUNTER — Inpatient Hospital Stay (HOSPITAL_COMMUNITY)
Admission: RE | Admit: 2013-12-15 | Discharge: 2013-12-25 | DRG: 236 | Disposition: A | Discharge status: Skilled Nursing Facility | Payer: Medicare Other | Source: Ambulatory Visit | Attending: Surgery | Admitting: Surgery

## 2013-12-15 ENCOUNTER — Inpatient Hospital Stay (HOSPITAL_COMMUNITY): Payer: Medicare Other | Admitting: Certified Registered"

## 2013-12-15 ENCOUNTER — Encounter (HOSPITAL_COMMUNITY)
Admission: RE | Disposition: A | Discharge status: Skilled Nursing Facility | Payer: Medicare Other | Source: Ambulatory Visit | Attending: Surgery

## 2013-12-15 ENCOUNTER — Inpatient Hospital Stay (HOSPITAL_COMMUNITY): Payer: Medicare Other

## 2013-12-15 DIAGNOSIS — I25810 Atherosclerosis of coronary artery bypass graft(s) without angina pectoris: Secondary | ICD-10-CM | POA: Diagnosis not present

## 2013-12-15 DIAGNOSIS — I10 Essential (primary) hypertension: Secondary | ICD-10-CM | POA: Diagnosis not present

## 2013-12-15 DIAGNOSIS — J4489 Other specified chronic obstructive pulmonary disease: Secondary | ICD-10-CM | POA: Diagnosis not present

## 2013-12-15 DIAGNOSIS — R609 Edema, unspecified: Secondary | ICD-10-CM | POA: Diagnosis not present

## 2013-12-15 DIAGNOSIS — E119 Type 2 diabetes mellitus without complications: Secondary | ICD-10-CM | POA: Diagnosis present

## 2013-12-15 DIAGNOSIS — I519 Heart disease, unspecified: Secondary | ICD-10-CM | POA: Diagnosis not present

## 2013-12-15 DIAGNOSIS — J9819 Other pulmonary collapse: Secondary | ICD-10-CM | POA: Diagnosis not present

## 2013-12-15 DIAGNOSIS — M949 Disorder of cartilage, unspecified: Secondary | ICD-10-CM | POA: Diagnosis not present

## 2013-12-15 DIAGNOSIS — E8779 Other fluid overload: Secondary | ICD-10-CM | POA: Diagnosis not present

## 2013-12-15 DIAGNOSIS — Z7982 Long term (current) use of aspirin: Secondary | ICD-10-CM | POA: Diagnosis not present

## 2013-12-15 DIAGNOSIS — D62 Acute posthemorrhagic anemia: Secondary | ICD-10-CM | POA: Diagnosis not present

## 2013-12-15 DIAGNOSIS — I4891 Unspecified atrial fibrillation: Secondary | ICD-10-CM | POA: Diagnosis not present

## 2013-12-15 DIAGNOSIS — I200 Unstable angina: Secondary | ICD-10-CM | POA: Diagnosis present

## 2013-12-15 DIAGNOSIS — F3289 Other specified depressive episodes: Secondary | ICD-10-CM | POA: Diagnosis present

## 2013-12-15 DIAGNOSIS — Z7901 Long term (current) use of anticoagulants: Secondary | ICD-10-CM

## 2013-12-15 DIAGNOSIS — I2510 Atherosclerotic heart disease of native coronary artery without angina pectoris: Secondary | ICD-10-CM | POA: Diagnosis not present

## 2013-12-15 DIAGNOSIS — J449 Chronic obstructive pulmonary disease, unspecified: Secondary | ICD-10-CM

## 2013-12-15 DIAGNOSIS — I70209 Unspecified atherosclerosis of native arteries of extremities, unspecified extremity: Secondary | ICD-10-CM | POA: Diagnosis present

## 2013-12-15 DIAGNOSIS — Z9861 Coronary angioplasty status: Secondary | ICD-10-CM

## 2013-12-15 DIAGNOSIS — R269 Unspecified abnormalities of gait and mobility: Secondary | ICD-10-CM | POA: Diagnosis not present

## 2013-12-15 DIAGNOSIS — J90 Pleural effusion, not elsewhere classified: Secondary | ICD-10-CM | POA: Diagnosis not present

## 2013-12-15 DIAGNOSIS — E785 Hyperlipidemia, unspecified: Secondary | ICD-10-CM | POA: Diagnosis present

## 2013-12-15 DIAGNOSIS — F411 Generalized anxiety disorder: Secondary | ICD-10-CM | POA: Diagnosis present

## 2013-12-15 DIAGNOSIS — I498 Other specified cardiac arrhythmias: Secondary | ICD-10-CM | POA: Diagnosis not present

## 2013-12-15 DIAGNOSIS — R918 Other nonspecific abnormal finding of lung field: Secondary | ICD-10-CM | POA: Diagnosis not present

## 2013-12-15 DIAGNOSIS — F329 Major depressive disorder, single episode, unspecified: Secondary | ICD-10-CM | POA: Diagnosis present

## 2013-12-15 DIAGNOSIS — F17200 Nicotine dependence, unspecified, uncomplicated: Secondary | ICD-10-CM | POA: Diagnosis present

## 2013-12-15 DIAGNOSIS — I739 Peripheral vascular disease, unspecified: Secondary | ICD-10-CM | POA: Diagnosis not present

## 2013-12-15 DIAGNOSIS — M818 Other osteoporosis without current pathological fracture: Secondary | ICD-10-CM | POA: Diagnosis not present

## 2013-12-15 DIAGNOSIS — M899 Disorder of bone, unspecified: Secondary | ICD-10-CM | POA: Diagnosis not present

## 2013-12-15 DIAGNOSIS — M6281 Muscle weakness (generalized): Secondary | ICD-10-CM | POA: Diagnosis not present

## 2013-12-15 DIAGNOSIS — Z8249 Family history of ischemic heart disease and other diseases of the circulatory system: Secondary | ICD-10-CM

## 2013-12-15 DIAGNOSIS — Z951 Presence of aortocoronary bypass graft: Secondary | ICD-10-CM

## 2013-12-15 DIAGNOSIS — J209 Acute bronchitis, unspecified: Secondary | ICD-10-CM | POA: Diagnosis not present

## 2013-12-15 DIAGNOSIS — I426 Alcoholic cardiomyopathy: Secondary | ICD-10-CM | POA: Diagnosis not present

## 2013-12-15 HISTORY — PX: CORONARY ARTERY BYPASS GRAFT: SHX141

## 2013-12-15 HISTORY — PX: INTRAOPERATIVE TRANSESOPHAGEAL ECHOCARDIOGRAM: SHX5062

## 2013-12-15 LAB — POCT I-STAT 4, (NA,K, GLUC, HGB,HCT)
GLUCOSE: 118 mg/dL — AB (ref 70–99)
GLUCOSE: 91 mg/dL (ref 70–99)
GLUCOSE: 98 mg/dL (ref 70–99)
GLUCOSE: 108 mg/dL — AB (ref 70–99)
Glucose, Bld: 88 mg/dL (ref 70–99)
Glucose, Bld: 64 mg/dL — ABNORMAL LOW (ref 70–99)
HCT: 34 % — ABNORMAL LOW (ref 36.0–46.0)
HCT: 32 % — ABNORMAL LOW (ref 36.0–46.0)
HCT: 26 % — ABNORMAL LOW (ref 36.0–46.0)
HCT: 28 % — ABNORMAL LOW (ref 36.0–46.0)
HEMATOCRIT: 27 % — AB (ref 36.0–46.0)
HEMATOCRIT: 31 % — AB (ref 36.0–46.0)
HEMOGLOBIN: 11.6 g/dL — AB (ref 12.0–15.0)
HEMOGLOBIN: 10.9 g/dL — AB (ref 12.0–15.0)
HEMOGLOBIN: 8.8 g/dL — AB (ref 12.0–15.0)
Hemoglobin: 9.2 g/dL — ABNORMAL LOW (ref 12.0–15.0)
Hemoglobin: 9.5 g/dL — ABNORMAL LOW (ref 12.0–15.0)
Hemoglobin: 10.5 g/dL — ABNORMAL LOW (ref 12.0–15.0)
POTASSIUM: 3.6 meq/L — AB (ref 3.7–5.3)
POTASSIUM: 3.9 meq/L (ref 3.7–5.3)
Potassium: 3.4 mEq/L — ABNORMAL LOW (ref 3.7–5.3)
Potassium: 3.7 mEq/L (ref 3.7–5.3)
Potassium: 3.8 mEq/L (ref 3.7–5.3)
Potassium: 3.6 mEq/L — ABNORMAL LOW (ref 3.7–5.3)
SODIUM: 140 meq/L (ref 137–147)
SODIUM: 136 meq/L — AB (ref 137–147)
SODIUM: 143 meq/L (ref 137–147)
Sodium: 139 mEq/L (ref 137–147)
Sodium: 137 mEq/L (ref 137–147)
Sodium: 141 mEq/L (ref 137–147)

## 2013-12-15 LAB — CBC
HEMATOCRIT: 31.7 % — AB (ref 36.0–46.0)
HEMATOCRIT: 31.7 % — AB (ref 36.0–46.0)
HEMOGLOBIN: 10.4 g/dL — AB (ref 12.0–15.0)
Hemoglobin: 10.4 g/dL — ABNORMAL LOW (ref 12.0–15.0)
MCH: 29.5 pg (ref 26.0–34.0)
MCH: 29.9 pg (ref 26.0–34.0)
MCHC: 32.8 g/dL (ref 30.0–36.0)
MCHC: 32.8 g/dL (ref 30.0–36.0)
MCV: 89.8 fL (ref 78.0–100.0)
MCV: 91.1 fL (ref 78.0–100.0)
Platelets: 139 10*3/uL — ABNORMAL LOW (ref 150–400)
Platelets: 144 10*3/uL — ABNORMAL LOW (ref 150–400)
RBC: 3.53 MIL/uL — AB (ref 3.87–5.11)
RBC: 3.48 MIL/uL — ABNORMAL LOW (ref 3.87–5.11)
RDW: 13.4 % (ref 11.5–15.5)
RDW: 13.6 % (ref 11.5–15.5)
WBC: 11 10*3/uL — AB (ref 4.0–10.5)
WBC: 9 10*3/uL (ref 4.0–10.5)

## 2013-12-15 LAB — POCT I-STAT 3, ART BLOOD GAS (G3+)
ACID-BASE DEFICIT: 2 mmol/L (ref 0.0–2.0)
ACID-BASE EXCESS: 2 mmol/L (ref 0.0–2.0)
BICARBONATE: 26 meq/L — AB (ref 20.0–24.0)
BICARBONATE: 24.9 meq/L — AB (ref 20.0–24.0)
Bicarbonate: 26.4 mEq/L — ABNORMAL HIGH (ref 20.0–24.0)
O2 SAT: 100 %
O2 SAT: 99 %
O2 Saturation: 88 %
PO2 ART: 134 mmHg — AB (ref 80.0–100.0)
Patient temperature: 37.3
TCO2: 27 mmol/L (ref 0–100)
TCO2: 28 mmol/L (ref 0–100)
TCO2: 26 mmol/L (ref 0–100)
pCO2 arterial: 37.6 mmHg (ref 35.0–45.0)
pCO2 arterial: 46.6 mmHg — ABNORMAL HIGH (ref 35.0–45.0)
pCO2 arterial: 49.5 mmHg — ABNORMAL HIGH (ref 35.0–45.0)
pH, Arterial: 7.449 (ref 7.350–7.450)
pH, Arterial: 7.354 (ref 7.350–7.450)
pH, Arterial: 7.307 — ABNORMAL LOW (ref 7.350–7.450)
pO2, Arterial: 363 mmHg — ABNORMAL HIGH (ref 80.0–100.0)
pO2, Arterial: 58 mmHg — ABNORMAL LOW (ref 80.0–100.0)

## 2013-12-15 LAB — PLATELET COUNT: Platelets: 129 10*3/uL — ABNORMAL LOW (ref 150–400)

## 2013-12-15 LAB — POCT I-STAT 3, VENOUS BLOOD GAS (G3P V)
ACID-BASE EXCESS: 4 mmol/L — AB (ref 0.0–2.0)
BICARBONATE: 24.1 meq/L — AB (ref 20.0–24.0)
Bicarbonate: 27.7 mEq/L — ABNORMAL HIGH (ref 20.0–24.0)
O2 SAT: 100 %
O2 SAT: 85 %
PO2 VEN: 350 mmHg — AB (ref 30.0–45.0)
Patient temperature: 33.3
Patient temperature: 33.3
TCO2: 25 mmol/L (ref 0–100)
TCO2: 29 mmol/L (ref 0–100)
pCO2, Ven: 31.5 mmHg — ABNORMAL LOW (ref 45.0–50.0)
pCO2, Ven: 32.4 mmHg — ABNORMAL LOW (ref 45.0–50.0)
pH, Ven: 7.476 — ABNORMAL HIGH (ref 7.250–7.300)
pH, Ven: 7.525 — ABNORMAL HIGH (ref 7.250–7.300)
pO2, Ven: 38 mmHg (ref 30.0–45.0)

## 2013-12-15 LAB — POCT I-STAT, CHEM 8
BUN: 5 mg/dL — AB (ref 6–23)
CALCIUM ION: 1.16 mmol/L (ref 1.13–1.30)
CREATININE: 0.5 mg/dL (ref 0.50–1.10)
Chloride: 107 mEq/L (ref 96–112)
Glucose, Bld: 124 mg/dL — ABNORMAL HIGH (ref 70–99)
HCT: 30 % — ABNORMAL LOW (ref 36.0–46.0)
Hemoglobin: 10.2 g/dL — ABNORMAL LOW (ref 12.0–15.0)
Potassium: 4.3 mEq/L (ref 3.7–5.3)
SODIUM: 144 meq/L (ref 137–147)
TCO2: 22 mmol/L (ref 0–100)

## 2013-12-15 LAB — GLUCOSE, CAPILLARY
GLUCOSE-CAPILLARY: 121 mg/dL — AB (ref 70–99)
GLUCOSE-CAPILLARY: 104 mg/dL — AB (ref 70–99)
GLUCOSE-CAPILLARY: 109 mg/dL — AB (ref 70–99)
Glucose-Capillary: 90 mg/dL (ref 70–99)
Glucose-Capillary: 80 mg/dL (ref 70–99)
Glucose-Capillary: 112 mg/dL — ABNORMAL HIGH (ref 70–99)

## 2013-12-15 LAB — APTT: aPTT: 31 seconds (ref 24–37)

## 2013-12-15 LAB — HEMOGLOBIN AND HEMATOCRIT, BLOOD
HCT: 25.8 % — ABNORMAL LOW (ref 36.0–46.0)
Hemoglobin: 8.7 g/dL — ABNORMAL LOW (ref 12.0–15.0)

## 2013-12-15 LAB — PROTIME-INR
INR: 1.42 (ref 0.00–1.49)
Prothrombin Time: 17 seconds — ABNORMAL HIGH (ref 11.6–15.2)

## 2013-12-15 LAB — CREATININE, SERUM
CREATININE: 0.47 mg/dL — AB (ref 0.50–1.10)
GFR calc Af Amer: >90 mL/min (ref >90)
GFR calc non Af Amer: >90 mL/min (ref >90)

## 2013-12-15 LAB — MAGNESIUM: Magnesium: 3.2 mg/dL — ABNORMAL HIGH (ref 1.5–2.5)

## 2013-12-15 SURGERY — CORONARY ARTERY BYPASS GRAFTING (CABG)
Anesthesia: General | Site: Chest

## 2013-12-15 MED ORDER — LEVALBUTEROL HCL 0.63 MG/3ML IN NEBU
0.6300 mg | INHALATION_SOLUTION | RESPIRATORY_TRACT | Status: DC | PRN
  Administered 2013-12-19 – 2013-12-20 (×2): 0.63 mg via RESPIRATORY_TRACT
  Filled 2013-12-15: qty 3

## 2013-12-15 MED ORDER — METOPROLOL TARTRATE 25 MG/10 ML ORAL SUSPENSION
12.5000 mg | Freq: Two times a day (BID) | ORAL | Status: DC
  Filled 2013-12-15 (×7): qty 5

## 2013-12-15 MED ORDER — PROTAMINE SULFATE 10 MG/ML IV SOLN
INTRAVENOUS | Status: DC | PRN
  Administered 2013-12-15: 30 mg via INTRAVENOUS
  Administered 2013-12-15: 50 mg via INTRAVENOUS
  Administered 2013-12-15: 20 mg via INTRAVENOUS
  Administered 2013-12-15 (×2): 50 mg via INTRAVENOUS

## 2013-12-15 MED ORDER — OXYCODONE HCL 5 MG PO TABS
5.0000 mg | ORAL_TABLET | ORAL | Status: DC | PRN
  Administered 2013-12-16 – 2013-12-17 (×5): 10 mg via ORAL
  Filled 2013-12-15 (×5): qty 2

## 2013-12-15 MED ORDER — FENTANYL CITRATE 0.05 MG/ML IJ SOLN
INTRAMUSCULAR | Status: AC
  Filled 2013-12-15: qty 5

## 2013-12-15 MED ORDER — HEMOSTATIC AGENTS (NO CHARGE) OPTIME
TOPICAL | Status: DC | PRN
  Administered 2013-12-15: 1 via TOPICAL

## 2013-12-15 MED ORDER — EPHEDRINE SULFATE 50 MG/ML IJ SOLN
INTRAMUSCULAR | Status: AC
  Filled 2013-12-15: qty 1

## 2013-12-15 MED ORDER — BISACODYL 10 MG RE SUPP: 10.0000 mg | Freq: Every day | RECTAL | Status: DC

## 2013-12-15 MED ORDER — PHENYLEPHRINE HCL 10 MG/ML IJ SOLN
10.0000 mg | INTRAVENOUS | Status: DC | PRN
  Administered 2013-12-15: 10 ug/min via INTRAVENOUS

## 2013-12-15 MED ORDER — METOPROLOL TARTRATE 12.5 MG HALF TABLET
12.5000 mg | ORAL_TABLET | Freq: Two times a day (BID) | ORAL | Status: DC
  Administered 2013-12-16 – 2013-12-18 (×3): 12.5 mg via ORAL
  Filled 2013-12-15 (×7): qty 1

## 2013-12-15 MED ORDER — ONDANSETRON HCL 4 MG/2ML IJ SOLN: 4.0000 mg | Freq: Four times a day (QID) | INTRAMUSCULAR | Status: DC | PRN

## 2013-12-15 MED ORDER — GLYCOPYRROLATE 0.2 MG/ML IJ SOLN
INTRAMUSCULAR | Status: DC | PRN
  Administered 2013-12-15: 0.2 mg via INTRAVENOUS

## 2013-12-15 MED ORDER — HEPARIN SODIUM (PORCINE) 1000 UNIT/ML IJ SOLN
INTRAMUSCULAR | Status: DC | PRN
  Administered 2013-12-15: 20000 [IU] via INTRAVENOUS

## 2013-12-15 MED ORDER — MIDAZOLAM HCL 5 MG/5ML IJ SOLN
INTRAMUSCULAR | Status: DC | PRN
  Administered 2013-12-15: 2 mg via INTRAVENOUS
  Administered 2013-12-15: 3 mg via INTRAVENOUS
  Administered 2013-12-15: 2 mg via INTRAVENOUS
  Administered 2013-12-15: 3 mg via INTRAVENOUS
  Administered 2013-12-15: 2 mg via INTRAVENOUS

## 2013-12-15 MED ORDER — LEVALBUTEROL HCL 0.63 MG/3ML IN NEBU
0.6300 mg | INHALATION_SOLUTION | RESPIRATORY_TRACT | Status: DC
  Administered 2013-12-15 – 2013-12-18 (×14): 0.63 mg via RESPIRATORY_TRACT
  Filled 2013-12-15 (×27): qty 3

## 2013-12-15 MED ORDER — ALBUMIN HUMAN 5 % IV SOLN
250.0000 mL | INTRAVENOUS | Status: AC | PRN
  Administered 2013-12-15 – 2013-12-16 (×3): 250 mL via INTRAVENOUS
  Filled 2013-12-15: qty 250

## 2013-12-15 MED ORDER — CLONAZEPAM 0.5 MG PO TABS
0.5000 mg | ORAL_TABLET | Freq: Two times a day (BID) | ORAL | Status: DC | PRN
  Administered 2013-12-16 – 2013-12-24 (×12): 0.5 mg via ORAL
  Filled 2013-12-15 (×12): qty 1

## 2013-12-15 MED ORDER — ACETAMINOPHEN 160 MG/5ML PO SOLN: 650.0000 mg | Freq: Once | ORAL | Status: AC

## 2013-12-15 MED ORDER — MIDAZOLAM HCL 2 MG/2ML IJ SOLN
INTRAMUSCULAR | Status: AC
  Filled 2013-12-15: qty 2

## 2013-12-15 MED ORDER — ACETAMINOPHEN 500 MG PO TABS
1000.0000 mg | ORAL_TABLET | Freq: Four times a day (QID) | ORAL | Status: DC
  Administered 2013-12-16 – 2013-12-19 (×8): 1000 mg via ORAL
  Filled 2013-12-15 (×16): qty 2

## 2013-12-15 MED ORDER — SODIUM CHLORIDE 0.9 % IJ SOLN
3.0000 mL | Freq: Two times a day (BID) | INTRAMUSCULAR | Status: DC
  Administered 2013-12-16 – 2013-12-19 (×6): 3 mL via INTRAVENOUS

## 2013-12-15 MED ORDER — TIOTROPIUM BROMIDE MONOHYDRATE 18 MCG IN CAPS
18.0000 ug | ORAL_CAPSULE | Freq: Every day | RESPIRATORY_TRACT | Status: DC
  Administered 2013-12-16 – 2013-12-24 (×8): 18 ug via RESPIRATORY_TRACT
  Filled 2013-12-15 (×2): qty 5

## 2013-12-15 MED ORDER — INSULIN ASPART 100 UNIT/ML ~~LOC~~ SOLN
0.0000 [IU] | SUBCUTANEOUS | Status: DC
  Administered 2013-12-15 – 2013-12-16 (×2): 2 [IU] via SUBCUTANEOUS

## 2013-12-15 MED ORDER — SODIUM CHLORIDE 0.45 % IV SOLN
INTRAVENOUS | Status: DC
Start: 2013-12-15 — End: 2013-12-19
  Administered 2013-12-15: 20 mL/h via INTRAVENOUS

## 2013-12-15 MED ORDER — NITROGLYCERIN IN D5W 200-5 MCG/ML-% IV SOLN: 0.0000 ug/min | INTRAVENOUS | Status: DC

## 2013-12-15 MED ORDER — ASPIRIN 81 MG PO CHEW: 324.0000 mg | CHEWABLE_TABLET | Freq: Every day | ORAL | Status: DC

## 2013-12-15 MED ORDER — FAMOTIDINE IN NACL 20-0.9 MG/50ML-% IV SOLN
20.0000 mg | Freq: Two times a day (BID) | INTRAVENOUS | Status: AC
  Administered 2013-12-15 (×2): 20 mg via INTRAVENOUS
  Filled 2013-12-15: qty 50

## 2013-12-15 MED ORDER — FENTANYL CITRATE 0.05 MG/ML IJ SOLN
INTRAMUSCULAR | Status: DC | PRN
Start: 2013-12-15 — End: 2013-12-15
  Administered 2013-12-15 (×2): 100 ug via INTRAVENOUS
  Administered 2013-12-15 (×2): 50 ug via INTRAVENOUS
  Administered 2013-12-15 (×3): 150 ug via INTRAVENOUS
  Administered 2013-12-15: 250 ug via INTRAVENOUS
  Administered 2013-12-15: 100 ug via INTRAVENOUS
  Administered 2013-12-15: 150 ug via INTRAVENOUS

## 2013-12-15 MED ORDER — DEXMEDETOMIDINE HCL IN NACL 400 MCG/100ML IV SOLN
0.4000 ug/kg/h | Freq: Once | INTRAVENOUS | Status: DC
  Filled 2013-12-15: qty 100

## 2013-12-15 MED ORDER — BISACODYL 5 MG PO TBEC
10.0000 mg | DELAYED_RELEASE_TABLET | Freq: Every day | ORAL | Status: DC
Start: 2013-12-16 — End: 2013-12-19
  Administered 2013-12-16 – 2013-12-19 (×3): 10 mg via ORAL
  Filled 2013-12-15 (×4): qty 2

## 2013-12-15 MED ORDER — CHLORHEXIDINE GLUCONATE 0.12 % MT SOLN
15.0000 mL | Freq: Two times a day (BID) | OROMUCOSAL | Status: DC
  Administered 2013-12-16 (×2): 15 mL via OROMUCOSAL
  Filled 2013-12-15 (×2): qty 15

## 2013-12-15 MED ORDER — THROMBIN 20000 UNITS EX SOLR
CUTANEOUS | Status: AC
  Filled 2013-12-15: qty 20000

## 2013-12-15 MED ORDER — PROTAMINE SULFATE 10 MG/ML IV SOLN
INTRAVENOUS | Status: AC
  Filled 2013-12-15: qty 25

## 2013-12-15 MED ORDER — SIMVASTATIN 20 MG PO TABS
20.0000 mg | ORAL_TABLET | Freq: Every day | ORAL | Status: DC
  Administered 2013-12-16 – 2013-12-24 (×8): 20 mg via ORAL
  Filled 2013-12-15 (×12): qty 1

## 2013-12-15 MED ORDER — PHENYLEPHRINE 40 MCG/ML (10ML) SYRINGE FOR IV PUSH (FOR BLOOD PRESSURE SUPPORT)
PREFILLED_SYRINGE | INTRAVENOUS | Status: AC
  Filled 2013-12-15: qty 10

## 2013-12-15 MED ORDER — PANTOPRAZOLE SODIUM 40 MG PO TBEC
40.0000 mg | DELAYED_RELEASE_TABLET | Freq: Every day | ORAL | Status: DC
  Administered 2013-12-18 – 2013-12-19 (×2): 40 mg via ORAL
  Filled 2013-12-15 (×3): qty 1

## 2013-12-15 MED ORDER — DEXTROSE 5 % IV SOLN
1.5000 g | Freq: Two times a day (BID) | INTRAVENOUS | Status: AC
  Administered 2013-12-15 – 2013-12-17 (×4): 1.5 g via INTRAVENOUS
  Filled 2013-12-15 (×4): qty 1.5

## 2013-12-15 MED ORDER — PROPOFOL 10 MG/ML IV BOLUS
INTRAVENOUS | Status: DC | PRN
  Administered 2013-12-15: 40 mg via INTRAVENOUS

## 2013-12-15 MED ORDER — ASPIRIN EC 325 MG PO TBEC
325.0000 mg | DELAYED_RELEASE_TABLET | Freq: Every day | ORAL | Status: DC
  Administered 2013-12-16 – 2013-12-19 (×3): 325 mg via ORAL
  Filled 2013-12-15 (×4): qty 1

## 2013-12-15 MED ORDER — ROCURONIUM BROMIDE 50 MG/5ML IV SOLN
INTRAVENOUS | Status: AC
  Filled 2013-12-15: qty 1

## 2013-12-15 MED ORDER — SUCCINYLCHOLINE CHLORIDE 20 MG/ML IJ SOLN
INTRAMUSCULAR | Status: AC
  Filled 2013-12-15: qty 1

## 2013-12-15 MED ORDER — MIDAZOLAM HCL 10 MG/2ML IJ SOLN
INTRAMUSCULAR | Status: AC
  Filled 2013-12-15: qty 2

## 2013-12-15 MED ORDER — ROCURONIUM BROMIDE 100 MG/10ML IV SOLN
INTRAVENOUS | Status: DC | PRN
  Administered 2013-12-15: 100 mg via INTRAVENOUS
  Administered 2013-12-15: 30 mg via INTRAVENOUS
  Administered 2013-12-15: 50 mg via INTRAVENOUS

## 2013-12-15 MED ORDER — 0.9 % SODIUM CHLORIDE (POUR BTL) OPTIME
TOPICAL | Status: DC | PRN
  Administered 2013-12-15: 1000 mL

## 2013-12-15 MED ORDER — DEXTROSE 50 % IV SOLN
INTRAVENOUS | Status: DC | PRN
  Administered 2013-12-15: 14 mL via INTRAVENOUS

## 2013-12-15 MED ORDER — DEXMEDETOMIDINE HCL IN NACL 200 MCG/50ML IV SOLN
0.1000 ug/kg/h | INTRAVENOUS | Status: DC
  Administered 2013-12-15: 0.7 ug/kg/h via INTRAVENOUS
  Administered 2013-12-16: 0.5 ug/kg/h via INTRAVENOUS
  Filled 2013-12-15: qty 50

## 2013-12-15 MED ORDER — ALBUMIN HUMAN 5 % IV SOLN
INTRAVENOUS | Status: DC | PRN
  Administered 2013-12-15: 12:00:00 via INTRAVENOUS

## 2013-12-15 MED ORDER — DEXTROSE 5 % IV SOLN
0.0000 ug/min | INTRAVENOUS | Status: DC
  Administered 2013-12-15: 5 ug/min via INTRAVENOUS
  Filled 2013-12-15: qty 2

## 2013-12-15 MED ORDER — POTASSIUM CHLORIDE 10 MEQ/50ML IV SOLN
10.0000 meq | INTRAVENOUS | Status: AC
  Administered 2013-12-15 (×3): 10 meq via INTRAVENOUS

## 2013-12-15 MED ORDER — MIDAZOLAM HCL 2 MG/2ML IJ SOLN
2.0000 mg | INTRAMUSCULAR | Status: DC | PRN
  Administered 2013-12-15 – 2013-12-16 (×2): 2 mg via INTRAVENOUS
  Filled 2013-12-15 (×2): qty 2

## 2013-12-15 MED ORDER — SODIUM CHLORIDE 0.9 % IV SOLN
INTRAVENOUS | Status: DC
  Filled 2013-12-15: qty 1

## 2013-12-15 MED ORDER — SODIUM CHLORIDE 0.9 % IJ SOLN
INTRAMUSCULAR | Status: AC
  Filled 2013-12-15: qty 10

## 2013-12-15 MED ORDER — LACTATED RINGERS IV SOLN: 500.0000 mL | Freq: Once | INTRAVENOUS | Status: AC | PRN

## 2013-12-15 MED ORDER — ACETAMINOPHEN 160 MG/5ML PO SOLN
1000.0000 mg | Freq: Four times a day (QID) | ORAL | Status: DC
  Administered 2013-12-16 (×2): 1000 mg
  Filled 2013-12-15 (×2): qty 40.6

## 2013-12-15 MED ORDER — PROPOFOL 10 MG/ML IV BOLUS
INTRAVENOUS | Status: AC
  Filled 2013-12-15: qty 20

## 2013-12-15 MED ORDER — LIDOCAINE HCL (CARDIAC) 20 MG/ML IV SOLN
INTRAVENOUS | Status: DC | PRN
  Administered 2013-12-15: 80 mg via INTRAVENOUS

## 2013-12-15 MED ORDER — MORPHINE SULFATE 2 MG/ML IJ SOLN
2.0000 mg | INTRAMUSCULAR | Status: DC | PRN
  Administered 2013-12-16 (×4): 2 mg via INTRAVENOUS
  Filled 2013-12-15 (×4): qty 1

## 2013-12-15 MED ORDER — LACTATED RINGERS IV SOLN
INTRAVENOUS | Status: DC | PRN
  Administered 2013-12-15 (×2): via INTRAVENOUS

## 2013-12-15 MED ORDER — SODIUM CHLORIDE 0.9 % IV SOLN: 250.0000 mL | INTRAVENOUS | Status: DC

## 2013-12-15 MED ORDER — ARTIFICIAL TEARS OP OINT
TOPICAL_OINTMENT | OPHTHALMIC | Status: DC | PRN
  Administered 2013-12-15: 1 via OPHTHALMIC

## 2013-12-15 MED ORDER — HEPARIN SODIUM (PORCINE) 1000 UNIT/ML IJ SOLN
INTRAMUSCULAR | Status: AC
  Filled 2013-12-15: qty 1

## 2013-12-15 MED ORDER — MAGNESIUM SULFATE 4000MG/100ML IJ SOLN
4.0000 g | Freq: Once | INTRAMUSCULAR | Status: AC
  Administered 2013-12-15: 4 g via INTRAVENOUS
  Filled 2013-12-15: qty 100

## 2013-12-15 MED ORDER — VANCOMYCIN HCL IN DEXTROSE 1-5 GM/200ML-% IV SOLN
1000.0000 mg | Freq: Once | INTRAVENOUS | Status: AC
  Administered 2013-12-15: 1000 mg via INTRAVENOUS
  Filled 2013-12-15: qty 200

## 2013-12-15 MED ORDER — SODIUM CHLORIDE 0.9 % IV SOLN
INTRAVENOUS | Status: DC
  Administered 2013-12-15: 20 mL/h via INTRAVENOUS

## 2013-12-15 MED ORDER — CHLORHEXIDINE GLUCONATE 0.12 % MT SOLN: 15.0000 mL | Freq: Two times a day (BID) | OROMUCOSAL | Status: DC

## 2013-12-15 MED ORDER — METOPROLOL TARTRATE 1 MG/ML IV SOLN: 2.5000 mg | INTRAVENOUS | Status: DC | PRN

## 2013-12-15 MED ORDER — LACTATED RINGERS IV SOLN
INTRAVENOUS | Status: DC
  Administered 2013-12-15: 20 mL/h via INTRAVENOUS
  Administered 2013-12-17: 07:00:00 via INTRAVENOUS

## 2013-12-15 MED ORDER — ACETAMINOPHEN 650 MG RE SUPP
650.0000 mg | Freq: Once | RECTAL | Status: AC
  Administered 2013-12-15: 650 mg via RECTAL

## 2013-12-15 MED ORDER — THROMBIN 20000 UNITS EX SOLR
OROMUCOSAL | Status: DC | PRN
  Administered 2013-12-15: 10:00:00 via TOPICAL

## 2013-12-15 MED ORDER — DOCUSATE SODIUM 100 MG PO CAPS
200.0000 mg | ORAL_CAPSULE | Freq: Every day | ORAL | Status: DC
  Administered 2013-12-16 – 2013-12-19 (×3): 200 mg via ORAL
  Filled 2013-12-15 (×4): qty 2

## 2013-12-15 MED ORDER — LIDOCAINE HCL (CARDIAC) 20 MG/ML IV SOLN
INTRAVENOUS | Status: AC
  Filled 2013-12-15: qty 5

## 2013-12-15 MED ORDER — BIOTENE DRY MOUTH MT LIQD
15.0000 mL | Freq: Four times a day (QID) | OROMUCOSAL | Status: DC
  Administered 2013-12-16 (×2): 15 mL via OROMUCOSAL

## 2013-12-15 MED ORDER — INSULIN REGULAR BOLUS VIA INFUSION
0.0000 [IU] | Freq: Three times a day (TID) | INTRAVENOUS | Status: DC
  Filled 2013-12-15: qty 10

## 2013-12-15 MED ORDER — MORPHINE SULFATE 2 MG/ML IJ SOLN
1.0000 mg | INTRAMUSCULAR | Status: AC | PRN
  Administered 2013-12-15 (×3): 2 mg via INTRAVENOUS
  Filled 2013-12-15 (×4): qty 1

## 2013-12-15 MED ORDER — SODIUM CHLORIDE 0.9 % IJ SOLN
3.0000 mL | INTRAMUSCULAR | Status: DC | PRN
  Administered 2013-12-16: 3 mL via INTRAVENOUS

## 2013-12-15 SURGICAL SUPPLY — 107 items
ATTRACTOMAT 16X20 MAGNETIC DRP (DRAPES) ×4 IMPLANT
BAG DECANTER FOR FLEXI CONT (MISCELLANEOUS) ×4 IMPLANT
BANDAGE ELASTIC 4 VELCRO ST LF (GAUZE/BANDAGES/DRESSINGS) ×4 IMPLANT
BANDAGE ELASTIC 6 VELCRO ST LF (GAUZE/BANDAGES/DRESSINGS) ×4 IMPLANT
BANDAGE GAUZE ELAST BULKY 4 IN (GAUZE/BANDAGES/DRESSINGS) ×4 IMPLANT
BASKET HEART  (ORDER IN 25'S) (MISCELLANEOUS) ×1
BASKET HEART (ORDER IN 25'S) (MISCELLANEOUS) ×1
BASKET HEART (ORDER IN 25S) (MISCELLANEOUS) ×2 IMPLANT
BENZOIN TINCTURE PRP APPL 2/3 (GAUZE/BANDAGES/DRESSINGS) ×4 IMPLANT
BLADE STERNUM SYSTEM 6 (BLADE) ×4 IMPLANT
BLADE SURG 11 STRL SS (BLADE) ×4 IMPLANT
BNDG GAUZE ELAST 4 BULKY (GAUZE/BANDAGES/DRESSINGS) ×4 IMPLANT
CANISTER SUCTION 2500CC (MISCELLANEOUS) ×4 IMPLANT
CANNULA VESSEL 3MM BLUNT TIP (CANNULA) ×12 IMPLANT
CARDIAC SUCTION (MISCELLANEOUS) ×4 IMPLANT
CATH ROBINSON RED A/P 18FR (CATHETERS) ×8 IMPLANT
CATH THORACIC 28FR (CATHETERS) ×4 IMPLANT
CATH THORACIC 36FR (CATHETERS) ×4 IMPLANT
CATH THORACIC 36FR RT ANG (CATHETERS) ×4 IMPLANT
CLIP TI MEDIUM 24 (CLIP) IMPLANT
CLIP TI WIDE RED SMALL 24 (CLIP) IMPLANT
CLOSURE WOUND 1/2 X4 (GAUZE/BANDAGES/DRESSINGS) ×1
COVER SURGICAL LIGHT HANDLE (MISCELLANEOUS) ×4 IMPLANT
CRADLE DONUT ADULT HEAD (MISCELLANEOUS) ×4 IMPLANT
DRAPE CARDIOVASCULAR INCISE (DRAPES) ×2
DRAPE SLUSH/WARMER DISC (DRAPES) ×4 IMPLANT
DRAPE SRG 135X102X78XABS (DRAPES) ×2 IMPLANT
DRSG COVADERM 4X14 (GAUZE/BANDAGES/DRESSINGS) ×4 IMPLANT
ELECT CAUTERY BLADE 6.4 (BLADE) ×4 IMPLANT
ELECT REM PT RETURN 9FT ADLT (ELECTROSURGICAL) ×8
ELECTRODE REM PT RTRN 9FT ADLT (ELECTROSURGICAL) ×4 IMPLANT
GLOVE BIO SURGEON STRL SZ 6 (GLOVE) ×8 IMPLANT
GLOVE BIO SURGEON STRL SZ 6.5 (GLOVE) ×18 IMPLANT
GLOVE BIO SURGEON STRL SZ7 (GLOVE) IMPLANT
GLOVE BIO SURGEON STRL SZ7.5 (GLOVE) IMPLANT
GLOVE BIO SURGEONS STRL SZ 6.5 (GLOVE) ×6
GLOVE BIOGEL M SZ8.5 STRL (GLOVE) ×4 IMPLANT
GLOVE BIOGEL PI IND STRL 6 (GLOVE) IMPLANT
GLOVE BIOGEL PI IND STRL 6.5 (GLOVE) IMPLANT
GLOVE BIOGEL PI IND STRL 7.0 (GLOVE) ×6 IMPLANT
GLOVE BIOGEL PI INDICATOR 6 (GLOVE)
GLOVE BIOGEL PI INDICATOR 6.5 (GLOVE)
GLOVE BIOGEL PI INDICATOR 7.0 (GLOVE) ×6
GLOVE EUDERMIC 7 POWDERFREE (GLOVE) ×8 IMPLANT
GLOVE ORTHO TXT STRL SZ7.5 (GLOVE) IMPLANT
GOWN STRL REUS W/ TWL LRG LVL3 (GOWN DISPOSABLE) ×12 IMPLANT
GOWN STRL REUS W/ TWL XL LVL3 (GOWN DISPOSABLE) ×2 IMPLANT
GOWN STRL REUS W/TWL LRG LVL3 (GOWN DISPOSABLE) ×12
GOWN STRL REUS W/TWL XL LVL3 (GOWN DISPOSABLE) ×2
HEMOSTAT POWDER SURGIFOAM 1G (HEMOSTASIS) ×12 IMPLANT
HEMOSTAT SURGICEL 2X14 (HEMOSTASIS) ×4 IMPLANT
INSERT FOGARTY 61MM (MISCELLANEOUS) IMPLANT
INSERT FOGARTY XLG (MISCELLANEOUS) IMPLANT
KIT BASIN OR (CUSTOM PROCEDURE TRAY) ×4 IMPLANT
KIT CATH CPB BARTLE (MISCELLANEOUS) ×4 IMPLANT
KIT ROOM TURNOVER OR (KITS) ×4 IMPLANT
KIT SUCTION CATH 14FR (SUCTIONS) ×4 IMPLANT
KIT VASOVIEW W/TROCAR VH 2000 (KITS) ×4 IMPLANT
NS IRRIG 1000ML POUR BTL (IV SOLUTION) ×20 IMPLANT
PACK OPEN HEART (CUSTOM PROCEDURE TRAY) ×4 IMPLANT
PAD ARMBOARD 7.5X6 YLW CONV (MISCELLANEOUS) ×8 IMPLANT
PAD ELECT DEFIB RADIOL ZOLL (MISCELLANEOUS) ×4 IMPLANT
PENCIL BUTTON HOLSTER BLD 10FT (ELECTRODE) ×4 IMPLANT
PUNCH AORTIC ROTATE 4.0MM (MISCELLANEOUS) IMPLANT
PUNCH AORTIC ROTATE 4.5MM 8IN (MISCELLANEOUS) ×4 IMPLANT
PUNCH AORTIC ROTATE 5MM 8IN (MISCELLANEOUS) IMPLANT
SET CARDIOPLEGIA MPS 5001102 (MISCELLANEOUS) ×4 IMPLANT
SPONGE GAUZE 4X4 12PLY (GAUZE/BANDAGES/DRESSINGS) ×8 IMPLANT
SPONGE GAUZE 4X4 12PLY STER LF (GAUZE/BANDAGES/DRESSINGS) ×8 IMPLANT
SPONGE INTESTINAL PEANUT (DISPOSABLE) IMPLANT
SPONGE LAP 18X18 X RAY DECT (DISPOSABLE) IMPLANT
SPONGE LAP 4X18 X RAY DECT (DISPOSABLE) ×4 IMPLANT
STRIP CLOSURE SKIN 1/2X4 (GAUZE/BANDAGES/DRESSINGS) ×3 IMPLANT
SUT BONE WAX W31G (SUTURE) ×4 IMPLANT
SUT MNCRL AB 4-0 PS2 18 (SUTURE) ×4 IMPLANT
SUT PROLENE 3 0 SH DA (SUTURE) IMPLANT
SUT PROLENE 3 0 SH1 36 (SUTURE) ×4 IMPLANT
SUT PROLENE 4 0 RB 1 (SUTURE) ×2
SUT PROLENE 4 0 SH DA (SUTURE) IMPLANT
SUT PROLENE 4-0 RB1 .5 CRCL 36 (SUTURE) ×2 IMPLANT
SUT PROLENE 5 0 C 1 36 (SUTURE) IMPLANT
SUT PROLENE 6 0 C 1 30 (SUTURE) ×8 IMPLANT
SUT PROLENE 7 0 BV 1 (SUTURE) ×8 IMPLANT
SUT PROLENE 7 0 BV1 MDA (SUTURE) ×4 IMPLANT
SUT PROLENE 8 0 BV175 6 (SUTURE) ×4 IMPLANT
SUT SILK  1 MH (SUTURE)
SUT SILK 1 MH (SUTURE) IMPLANT
SUT STEEL STERNAL CCS#1 18IN (SUTURE) ×8 IMPLANT
SUT STEEL SZ 6 DBL 3X14 BALL (SUTURE) IMPLANT
SUT VIC AB 1 CTX 36 (SUTURE) ×4
SUT VIC AB 1 CTX36XBRD ANBCTR (SUTURE) ×4 IMPLANT
SUT VIC AB 2-0 CT1 27 (SUTURE) ×2
SUT VIC AB 2-0 CT1 TAPERPNT 27 (SUTURE) ×2 IMPLANT
SUT VIC AB 2-0 CTX 27 (SUTURE) IMPLANT
SUT VIC AB 3-0 SH 27 (SUTURE)
SUT VIC AB 3-0 SH 27X BRD (SUTURE) IMPLANT
SUT VIC AB 3-0 X1 27 (SUTURE) IMPLANT
SUT VICRYL 4-0 PS2 18IN ABS (SUTURE) IMPLANT
SUTURE E-PAK OPEN HEART (SUTURE) ×4 IMPLANT
SYSTEM SAHARA CHEST DRAIN ATS (WOUND CARE) ×4 IMPLANT
TAPE CLOTH SURG 4X10 WHT LF (GAUZE/BANDAGES/DRESSINGS) ×8 IMPLANT
TOWEL OR 17X24 6PK STRL BLUE (TOWEL DISPOSABLE) ×4 IMPLANT
TOWEL OR 17X26 10 PK STRL BLUE (TOWEL DISPOSABLE) ×4 IMPLANT
TRAY FOLEY IC TEMP SENS 16FR (CATHETERS) ×4 IMPLANT
TUBING INSUFFLATION 10FT LAP (TUBING) ×4 IMPLANT
UNDERPAD 30X30 INCONTINENT (UNDERPADS AND DIAPERS) ×4 IMPLANT
WATER STERILE IRR 1000ML POUR (IV SOLUTION) ×8 IMPLANT

## 2013-12-15 NOTE — Progress Notes (Signed)
Notified Dr. Cherene Altes of patient having 2 oz black coffee 0330 am.

## 2013-12-15 NOTE — H&P (Signed)
[address]       [place] [address]             [phone]      Cardiothoracic Surgery History and Physical   PCP is Kathlene November, MD  Referring Provider is Sherren Mocha, MD  Chief Complaint   Patient presents with   .  Coronary Artery Disease     Surgical eval, Cardiac Cath 11/28/2013- Dr Burt Knack   HPI:  The patient is a 73 year old current smoker with COPD, hyperlipidemia, HTN, and know CAD s/p PCI of the LCX with a DES in 07/2010. In 06/2011 she underwent complex PCI of the LCX/OM bifurcation and then PTCA/cutting balloon to the LCX in 06/2012. She continues to have angina on a daily basis that seems to be the worst in the morning on awakening and is slowly resolved with NTG. Surprisingly she does not have angina with ambulation during the day. She has continued to smoke 1 ppd and has smoked for 55 years. Cardiac cath on 11/28/2013 shows the LAD to be heavily calcified throughout but with no significant stenosis. The LCX has a hypodense in-stent restenosis at the origin of the OM1 that was estimated at 50% but may be worse. The OM1 has 50% ostial and 90% proximal stenosis. There is am OM2 that has no significant disease but is compromised by the proximal LCX stenosis. The RCA is likely the culprit with 90% ostial stenosis and severe diffuse mid-vessel stenosis with to and fro flow in the distal vessel. The LVEF is normal at 55-65%. Aortic arch angiography shows severe calcification of the entire arch and the arch vessels.  Past Medical History   Diagnosis  Date   .  Osteopenia    .  COPD (chronic obstructive pulmonary disease)    .  Hyperlipidemia    .  Thyroid nodule    .  Abnormal LFTs    .  Depression    .  HTN (hypertension)      intolerant to ACEi (cough)   .  Allergic rhinitis    .  Anxiety    .  Panic attacks    .  PAD (peripheral artery disease)      Carotid dz as below, and at [facility] 11/12: critical stenosis noted just below the sheath site leading  into a totally occluded SFA and a patent deep femoral artery   .  Carotid artery disease      Moderate - f/u dopplers needed 11/2012   .  CAD (coronary artery disease)      s/p DES to the circumflex 12/11; 06/2011: complex PCI (POBA) of the CFX/OM bifurcation with IVUS guidance; [place] s/p PTCA/cutting balloon to LCx 06/2012 (chronic subtotally occ RCA with L-R collaterals, residual nonobst LAD dz)   .  Arthritis      In her thumb    Past Surgical History   Procedure  Laterality  Date   .  Coronary angioplasty with stent placement       "1; makes total of 3" (07/05/2012)   .  Vaginal hysterectomy   ~ 1976     no oophorectomy   .  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  Coronary artery disease  Father    .  Heart attack  Father    .  Hypertension  Mother    .  Stroke  Neg Hx    .  Colon cancer  Neg Hx    .  Breast cancer  Neg Hx    .  Heart attack  Brother    Social History  History   Substance Use Topics   .  Smoking status:  Current Every Day Smoker -- 0.50 packs/day for 54 years     Types:  Cigarettes   .  Smokeless tobacco:  Never Used      Comment: 07/05/2012 " smoking less since heart stent 07-2010"   .  Alcohol Use:  3.6 oz/week     6 Shots of liquor per week      Comment: 07/05/2012 "gin & tonic; 2 drinks ~ 3 X/wk"    Current Outpatient Prescriptions   Medication  Sig  Dispense  Refill   .  amLODipine (NORVASC) 5 MG tablet  Take 5 mg by mouth daily.     Marland Kitchen  aspirin 81 MG tablet  Take 1 tablet (81 mg total) by mouth daily.     .  Cholecalciferol (VITAMIN D3) 1000 UNITS CAPS  Take 1,000 Units by mouth daily.     .  clonazePAM (KLONOPIN) 0.5 MG tablet  Take 1 tablet (0.5 mg total) by mouth 2 (two) times daily as needed for anxiety.  60 tablet  3   .  clopidogrel (PLAVIX) 75 MG tablet  Take 75 mg by mouth daily with  breakfast.     .  isosorbide mononitrate (IMDUR) 60 MG 24 hr tablet  Take 60 mg by mouth 2 (two) times daily.     Marland Kitchen  losartan-hydrochlorothiazide (HYZAAR) 100-12.5 MG per tablet  Take 1 tablet by mouth daily.     .  Naproxen Sodium (ALEVE) 220 MG CAPS  Take 2 capsules by mouth daily as needed (for pain). Per bottle as needed     .  nitroGLYCERIN (NITROSTAT) 0.4 MG SL tablet  Place 0.4 mg under the tongue every 5 (five) minutes as needed. Chest pain.     .  simvastatin (ZOCOR) 20 MG tablet  Take 20 mg by mouth daily.     Marland Kitchen  tiotropium (SPIRIVA) 18 MCG inhalation capsule  Place 1 capsule (18 mcg total) into inhaler and inhale daily.  30 capsule  6    No current facility-administered medications for this visit.    Allergies   Allergen  Reactions   .  Ace Inhibitors      cough   Review of Systems  Constitutional: Positive for activity change and fatigue.  HENT: Negative.  Eyes: Negative.  Respiratory: Positive for cough and shortness of breath.  Cardiovascular: Positive for chest pain. Negative for palpitations and leg swelling.  Gastrointestinal: Negative.  Endocrine: Negative.  Genitourinary: Negative.  Musculoskeletal: Positive for arthralgias.  Allergic/Immunologic: Negative.  Neurological: Negative.  Hematological: Bruises/bleeds easily.  Psychiatric/Behavioral: The patient is nervous/anxious.  BP 134/79  Pulse 100  Resp 20  Ht 5\' 3"  (1.6 m)  Wt 138 lb (62.596 kg)  BMI 24.45 kg/m2  SpO2 97%  Physical Exam  Constitutional: She is oriented to person, place, and time. She appears well-developed and well-nourished. No distress.  HENT:  Head: Normocephalic and atraumatic.  Mouth/Throat: Oropharynx is clear and moist.  Eyes: EOM are normal. Pupils are equal, round, and reactive to light.  Neck: Normal range of motion. Neck supple. No  JVD present. No thyromegaly present.  Cardiovascular: Normal rate, regular rhythm, normal heart sounds and intact distal pulses.  No murmur heard.    Pulmonary/Chest: Effort normal and breath sounds normal. No respiratory distress. She has no rales.  Abdominal: Soft. Bowel sounds are normal. She exhibits no distension. There is no tenderness.  Musculoskeletal: She exhibits no edema.  Lymphadenopathy:  She has no cervical adenopathy.  Neurological: She is alert and oriented to person, place, and time. No cranial nerve deficit or sensory deficit.  Skin: Skin is warm and dry.  Psychiatric: She has a normal mood and affect.  Diagnostic Tests:  Cardiac Catheterization Procedure Note  Name: Patricia [name]  MRN: [id number]  DOB: 24-Oct-1940  Procedure: Left Heart Cath, Selective Coronary Angiography, LV angiography  Indication: CCS Class 3 angina, max med Rx. Known CAD with prior PCI and episodes of recurrent ISR.  Procedural Details: The right wrist was prepped, draped, and anesthetized with 1% lidocaine. Using the modified Seldinger technique, a 5 French sheath was introduced into the right radial artery. 3 mg of verapamil was administered through the sheath, weight-based unfractionated heparin was administered intravenously. Standard Judkins catheters were used for selective coronary angiography and left ventriculography. Catheter exchanges were performed over an exchange length guidewire. There were no immediate procedural complications. A TR band was used for radial hemostasis at the completion of the procedure. The patient was transferred to the post catheterization recovery area for further monitoring.  Procedural Findings:  Hemodynamics:  AO 116/43  LV 116/8  Coronary angiography:  Coronary dominance: right  Left mainstem: The left main is moderately calcified. The vessel is widely patent without stenosis. It divides into the LAD and left circumflex.  Left anterior descending (LAD): The LAD is heavily calcified throughout its course. Despite the heavy calcification, there is no significant obstructive disease. The vessel has 20-30% diffuse  proximal stenosis. The mid and distal vessel are patent. The LAD wraps around the LV apex. There are no significant diagonals arising from the LAD.  Left circumflex (LCx): The left circumflex is patent. There is heavy calcification. The stented segment of the left circumflex has hypodense 50% in-stent restenosis involving the origin of the first obtuse marginal branch. The remaining portions of the circumflex are patent without significant stenosis. The second OM divides into twin vessels. The first obtuse marginal which arises from the stented segment, has 50% ostial stenosis and 90% proximal vessel stenosis. This is a medium caliber vessel.  Right coronary artery (RCA): The right coronary artery has very severe diffuse calcification. The vessel has 90% ostial stenosis followed by severe diffuse mid vessel stenosis. There is to and fro flow in the distal vessel from competing left or right collaterals. The PDA is large in caliber and fills from extensive septal collaterals.  Left ventriculography: Left ventricular systolic function is normal, LVEF is estimated at 55-65%, there is no significant mitral regurgitation  Aortic arch angiography: There is severe calcification of the entire aortic arch. This extends into the ascending aorta. There is severe calcification of the great vessels.  Final Conclusions:  1. Severe 2 vessel coronary artery disease with chronic severe stenosis of the right coronary artery collateralized from the LCA and severe stenosis of the first OM branch of the circumflex  2. Calcified but nonobstructive LAD stenosis  3. Preserved LV systolic function  4. Severe aortic calcification  Recommendations: Unfortunately I think the patient's options are somewhat limited. PCI of the OM branch of the circumflex is feasible, but would  involve crossing through the stent struts and dealing with diffuse disease in the left circumflex/obtuse marginal bifurcation. The right coronary artery has a  very long segment of severely calcified stenosis and again could potentially be approached from a percutaneous standpoint but this would also be complex and involves significant risk. Coronary bypass surgery could be considered, but this patient is a heavy smoker and has an extremely calcified aorta raising concern for her perioperative risk of stroke from aortic pathology. I think the best initial approach at therapy is continued medical management. If she desires I will send her for a cardiac surgical opinion.  Sherren Mocha  11/28/2013, 10:47 AM  Impression:  She has severe multi-vessel coronary artery disease with unstable anginal symptoms that are very life-style limiting. The RCA and LCX branches are graftable and would resolve her angina and improve her quality of life. She has significant calcified plaque in the distal ascending aorta and aortic arch that would increase her risk of stroke but I think we can minimize that risk. I discussed the option of continued medical therapy but I think she will have significant problems if the RCA shuts down, especially with the LCX stenoses. I discussed the operative procedure with the patient and her 2 nieces including alternatives, benefits and risks; including but not limited to bleeding, blood transfusion, infection, stroke, myocardial infarction, graft failure, heart block requiring a permanent pacemaker, organ dysfunction, and death. Patricia [name] understands and agrees to proceed. She will look at her schedule and call us to schedule in the next couple weeks.   Plan:  Coronary artery bypass graft surgery

## 2013-12-15 NOTE — Progress Notes (Signed)
Patient ID: Patricia [name], female   DOB: [birth year], 73 y.o.   MRN: [id number]   SICU Evening Rounds:   Hemodynamically stable  CI = 1.9  Has started to wake up on vent.    Urine output good  CT output low  CBC    Component Value Date/Time   WBC 11.0* 12/15/2013 1300   RBC 3.53* 12/15/2013 1300   HGB 10.4* 12/15/2013 1300   HCT 31.7* 12/15/2013 1300   PLT 139* 12/15/2013 1300   MCV 89.8 12/15/2013 1300   MCH 29.5 12/15/2013 1300   MCHC 32.8 12/15/2013 1300   RDW 13.4 12/15/2013 1300   LYMPHSABS 2.4 07/11/2013 1543   MONOABS 0.4 07/11/2013 1543   EOSABS 0.1 07/11/2013 1543   BASOSABS 0.1 07/11/2013 1543     BMET    Component Value Date/Time   NA 143 12/15/2013 1250   K 3.6* 12/15/2013 1250   CL 103 12/13/2013 1606   CO2 19 12/13/2013 1606   GLUCOSE 108* 12/15/2013 1250   BUN 11 12/13/2013 1606   CREATININE 0.44* 12/13/2013 1606   CREATININE 0.60 02/06/2012 1524   CALCIUM 9.2 12/13/2013 1606   GFRNONAA >90 12/13/2013 1606   GFRAA >90 12/13/2013 1606     A/P:  Stable postop course. Continue current plans. She is a heavy smoker with moderate decrease in spirometry but severe decrease in  diffusion capacity.

## 2013-12-15 NOTE — OR Nursing (Signed)
SICU First call @ 1121

## 2013-12-15 NOTE — Progress Notes (Addendum)
Patient failed attempt at extubation earlier. She has heavy tobacco abuse (and COPD) with moderate decrease in spirometry but severe decrease in diffusion capacity.She is sedated.  VITAL SIGNS: Temp 97.5 (36.4) HR 87 RR 16 BP 117/52 Art Line BP 106/47 SPO2% 95  I.V. (mL/kg) 2908.8 (47.2) Blood 180 NG/GT 30 IV Piggyback 750 Total Intake(mL/kg) 3868.8 (62.7) Urine (mL/kg/hr) 3275 (3.7) Emesis/NG output 150 (0.2) Blood 1964 (2.2) Chest Tube 120 (0.1) Total Output 5509 Net -1640.2  H and H stable at 10.2 and 30 Bun/Cr 5/0.47  CI 1.9  On Neo synephrine 10 mcg/min  Will attempt extubation later

## 2013-12-15 NOTE — Progress Notes (Signed)
RT tried mechanics with pt. Pt failed at this time and desated. Placed back on full support

## 2013-12-15 NOTE — Progress Notes (Signed)
Placed back on full support due to abg results and failed mechanics

## 2013-12-15 NOTE — Anesthesia Procedure Notes (Signed)
Procedure Name: Intubation Date/Time: 12/15/2013 7:47 AM Performed by: Maeola Harman Pre-anesthesia Checklist: Patient identified, Emergency Drugs available, Suction available, Patient being monitored and Timeout performed Patient Re-evaluated:Patient Re-evaluated prior to inductionOxygen Delivery Method: Circle system utilized Preoxygenation: Pre-oxygenation with 100% oxygen Intubation Type: IV induction Ventilation: Mask ventilation without difficulty Laryngoscope Size: Mac and 3 Grade View: Grade I Tube size: 8.0 mm Number of attempts: 1 Airway Equipment and Method: Stylet Placement Confirmation: ETT inserted through vocal cords under direct vision,  positive ETCO2 and breath sounds checked- equal and bilateral Secured at: 22 cm Tube secured with: Tape Dental Injury: Teeth and Oropharynx as per pre-operative assessment  Comments: Easy atraumatic induction and intubation with MAC 3 blade.  Dr Ermalene Postin verified placement of ETT.  Waldron Session, CRNA

## 2013-12-15 NOTE — Progress Notes (Signed)
  Echocardiogram Echocardiogram Transesophageal has been performed.  Patricia [name] 12/15/2013, 9:04 AM

## 2013-12-15 NOTE — Anesthesia Preprocedure Evaluation (Addendum)
Anesthesia Evaluation  Patient identified by MRN, date of birth, ID band Patient awake    Reviewed: Allergy & Precautions, H&P , NPO status , Patient's Chart, lab work & pertinent test results, reviewed documented beta blocker date and time   Airway Mallampati: II TM Distance: >3 FB Neck ROM: Full    Dental  (+) Partial Lower, Poor Dentition, Dental Advisory Given, Edentulous Upper   Pulmonary shortness of breath and with exertion, COPD COPD inhaler, Current Smoker,  breath sounds clear to auscultation        Cardiovascular hypertension, Pt. on medications + CAD and + Peripheral Vascular Disease Rhythm:Regular     Neuro/Psych Anxiety Depression    GI/Hepatic (+)     substance abuse  alcohol use,   Endo/Other    Renal/GU      Musculoskeletal   Abdominal (+)  Abdomen: soft. Bowel sounds: normal.  Peds  Hematology   Anesthesia Other Findings   Reproductive/Obstetrics                       Anesthesia Physical Anesthesia Plan  ASA: IV  Anesthesia Plan: General   Post-op Pain Management:    Induction: Intravenous  Airway Management Planned: Oral ETT  Additional Equipment: Arterial line, CVP, PA Cath, 3D TEE and Ultrasound Guidance Line Placement  Intra-op Plan:   Post-operative Plan: Post-operative intubation/ventilation  Informed Consent: I have reviewed the patients History and Physical, chart, labs and discussed the procedure including the risks, benefits and alternatives for the proposed anesthesia with the patient or authorized representative who has indicated his/her understanding and acceptance.   Dental advisory given  Plan Discussed with: CRNA, Anesthesiologist and Surgeon  Anesthesia Plan Comments:         Anesthesia Quick Evaluation

## 2013-12-15 NOTE — Anesthesia Postprocedure Evaluation (Signed)
  Anesthesia Post-op Note  Patient: Patricia [name]  Procedure(s) Performed: Procedure(s) with comments: CORONARY ARTERY BYPASS GRAFTING (CABG) (N/A) - Times 3 using left internal mammary artery and endoscopically harvested right saphenous vein  INTRAOPERATIVE TRANSESOPHAGEAL ECHOCARDIOGRAM (N/A)  Patient Location: ICU  Anesthesia Type:General  Level of Consciousness: sedated  Airway and Oxygen Therapy: Patient remains intubated per anesthesia plan  Post-op Pain: none  Post-op Assessment: Post-op Vital signs reviewed, Patient's Cardiovascular Status Stable, Respiratory Function Stable, Patent Airway, No signs of Nausea or vomiting and Pain level controlled  Post-op Vital Signs: Reviewed and stable  Last Vitals:  Filed Vitals:   12/15/13 1245  BP: 109/57  Pulse: 84  Temp:   Resp:     Complications: No apparent anesthesia complications

## 2013-12-15 NOTE — Transfer of Care (Signed)
Immediate Anesthesia Transfer of Care Note  Patient: Patricia [name]  Procedure(s) Performed: Procedure(s) with comments: CORONARY ARTERY BYPASS GRAFTING (CABG) (N/A) - Times 3 using left internal mammary artery and endoscopically harvested right saphenous vein  INTRAOPERATIVE TRANSESOPHAGEAL ECHOCARDIOGRAM (N/A)  Patient Location: SICU  Anesthesia Type:General  Level of Consciousness: Patient remains intubated per anesthesia plan  Airway & Oxygen Therapy: Patient remains intubated per anesthesia plan  Post-op Assessment: Post -op Vital signs reviewed and stable  Post vital signs: stable  Complications: No apparent anesthesia complications

## 2013-12-15 NOTE — Op Note (Signed)
CARDIOVASCULAR SURGERY OPERATIVE NOTE  12/15/2013  Surgeon:  Gaye Pollack, MD  First Assistant: Suzzanne Cloud,  [facility] [name]   Preoperative Diagnosis:  Severe multi-vessel coronary artery disease   Postoperative Diagnosis:  Same   Procedure:  1. Median Sternotomy 2. Extracorporeal circulation 3.   Coronary artery bypass grafting x 3   Left internal mammary graft to the OM1  SVG to OM2  SVG to RCA   4.   Endoscopic vein harvest from the right leg   Anesthesia:  General Endotracheal   Clinical History/Surgical Indication:  The patient is a 73 year old current smoker with COPD, hyperlipidemia, HTN, and know CAD s/p PCI of the LCX with a DES in 07/2010. In 06/2011 she underwent complex PCI of the LCX/OM bifurcation and then PTCA/cutting balloon to the LCX in 06/2012. She continues to have angina on a daily basis that seems to be the worst in the morning on awakening and is slowly resolved with NTG. Surprisingly she does not have angina with ambulation during the day. She has continued to smoke 1 ppd and has smoked for 55 years. Cardiac cath on 11/28/2013 shows the LAD to be heavily calcified throughout but with no significant stenosis. The LCX has a hypodense in-stent restenosis at the origin of the OM1 that was estimated at 50% but may be worse. The OM1 has 50% ostial and 90% proximal stenosis. There is am OM2 that has no significant disease but is compromised by the proximal LCX stenosis. The RCA is likely the culprit with 90% ostial stenosis and severe diffuse mid-vessel stenosis with to and fro flow in the distal vessel. The LVEF is normal at 55-65%. Aortic arch angiography shows severe calcification of the entire arch and the arch vessels. She has severe multi-vessel coronary artery disease with unstable anginal symptoms that are very life-style limiting. The RCA and LCX branches are graftable and  would resolve her angina and improve her quality of life. She has significant calcified plaque in the distal ascending aorta and aortic arch that would increase her risk of stroke but I think we can minimize that risk. I discussed the option of continued medical therapy but I think she will have significant problems if the RCA shuts down, especially with the LCX stenoses. I discussed the operative procedure with the patient and her 2 nieces including alternatives, benefits and risks; including but not limited to bleeding, blood transfusion, infection, stroke, myocardial infarction, graft failure, heart block requiring a permanent pacemaker, organ dysfunction, and death. Kyndra Condron understands and agrees to proceed.   Preparation:  The patient was seen in the preoperative holding area and the correct patient, correct operation were confirmed with the patient after reviewing the medical record and catheterization. The consent was signed by me. Preoperative antibiotics were given. A pulmonary arterial line and radial arterial line were placed by the anesthesia team. The patient was taken back to the operating room and positioned supine on the operating room table. After being placed under general endotracheal anesthesia by the anesthesia team a foley catheter was placed. The neck, chest, abdomen, and both legs were prepped with betadine soap and solution and draped in the usual sterile manner. A surgical time-out was taken and the correct patient and operative procedure were confirmed with the nursing and anesthesia staff.  TEE:  Performed by Dr. Laurie Panda. This showed normal left ventricular function with moderate concentric LVH. There was no AS, AI, or MR.  Cardiopulmonary Bypass:  A median sternotomy was performed.  The pericardium was opened in the midline. Right ventricular function appeared normal. The ascending aorta was of normal size and had some calcified plaque low in the root portion .  There were no contraindications to aortic cannulation or cross-clamping. The patient was fully systemically heparinized and the ACT was maintained > 400 sec. The proximal aortic arch was cannulated with a 20 F aortic cannula for arterial inflow. Venous cannulation was performed via the right atrial appendage using a two-staged venous cannula. An antegrade cardioplegia/vent cannula was inserted into the mid-ascending aorta. Aortic occlusion was performed with a single cross-clamp. Systemic cooling to 32 degrees Centigrade and topical cooling of the heart with iced saline were used. Hyperkalemic antegrade cold blood cardioplegia was used to induce diastolic arrest and was then given at about 20 minute intervals throughout the period of arrest to maintain myocardial temperature at or below 10 degrees centigrade. A temperature probe was inserted into the interventricular septum and an insulating pad was placed in the pericardium.   Left internal mammary harvest:  The left side of the sternum was retracted using the Rultract retractor. The left internal mammary artery was harvested as a pedicle graft. All side branches were clipped. It was a medium-sized vessel of good quality with excellent blood flow. It was ligated distally and divided. It was sprayed with topical papaverine solution to prevent vasospasm.   Endoscopic vein harvest:  The right greater saphenous vein was harvested endoscopically through a 2 cm incision medial to the right knee. It was harvested from the upper thigh to below the knee. It was a medium-sized vein of good quality. The side branches were all ligated with 4-0 silk ties.    Coronary arteries:  The coronary arteries were examined.   LAD:  Diffusely diseased with calcified plaque but no stenosis on angiogram  LCX:  OM1 was a large vessel that was heavily diseased proximally with calcified plaque but minimal disease distally. The OM2 was slightly larger and likewise heavily  diseased proximally but with minimal disease distally.  RCA:  The distal portion just proximal to the PDA was minimally diseased and graftable. The PDA was small and diffusely diseased.   Grafts:  1. LIMA to the OM1: 1.75 mm. It was sewn end to side using 8-0 prolene continuous suture. 2. SVG to OM2:  1.75 mm. It was sewn end to side using 7-0 prolene continuous suture. 3. SVG to distal RCA:  1.75 mm. It was sewn end to side using 7-0 prolene continuous suture.   The proximal vein graft anastomoses were performed to the mid-ascending aorta using continuous 6-0 prolene suture. Graft markers were placed around the proximal anastomoses.   Completion:  The patient was rewarmed to 37 degrees Centigrade. The clamp was removed from the LIMA pedicle and there was return of ventricular fibrillation. He was defibrillated into sinus rhythm. The distal and proximal anastomoses were checked for hemostasis. The position of the grafts was satisfactory. Two temporary epicardial pacing wires were placed on the right atrium and two on the right ventricle. The patient was weaned from CPB without difficulty on no inotropes. Cardiac output was 5 LPM. Heparin was fully reversed with protamine and the aortic and venous cannulas removed. Hemostasis was achieved. Mediastinal and left pleural drainage tubes were placed. The sternum was closed with  #6 stainless steel wires. The fascia was closed with continuous # 1 vicryl suture. The subcutaneous tissue was closed with 2-0 vicryl continuous suture. The skin was closed with 3-0 vicryl subcuticular  suture. All sponge, needle, and instrument counts were reported correct at the end of the case. Dry sterile dressings were placed over the incisions and around the chest tubes which were connected to pleurevac suction. The patient was then transported to the surgical intensive care unit in critical but stable condition.

## 2013-12-15 NOTE — Interval H&P Note (Signed)
History and Physical Interval Note:  12/15/2013 7:23 AM  Patricia [name]  has presented today for surgery, with the diagnosis of CAD  The various methods of treatment have been discussed with the patient and family. After consideration of risks, benefits and other options for treatment, the patient has consented to  Procedure(s): CORONARY ARTERY BYPASS GRAFTING (CABG) (N/A) INTRAOPERATIVE TRANSESOPHAGEAL ECHOCARDIOGRAM (N/A) as a surgical intervention .  The patient's history has been reviewed, patient examined, no change in status, stable for surgery.  I have reviewed the patient's chart and labs.  Questions were answered to the patient's satisfaction.     Gaye Pollack

## 2013-12-16 ENCOUNTER — Encounter (HOSPITAL_COMMUNITY): Payer: Self-pay | Admitting: Surgery

## 2013-12-16 ENCOUNTER — Inpatient Hospital Stay (HOSPITAL_COMMUNITY): Payer: Medicare Other

## 2013-12-16 LAB — GLUCOSE, CAPILLARY
GLUCOSE-CAPILLARY: 116 mg/dL — AB (ref 70–99)
GLUCOSE-CAPILLARY: 122 mg/dL — AB (ref 70–99)
GLUCOSE-CAPILLARY: 89 mg/dL (ref 70–99)
Glucose-Capillary: 112 mg/dL — ABNORMAL HIGH (ref 70–99)
Glucose-Capillary: 109 mg/dL — ABNORMAL HIGH (ref 70–99)

## 2013-12-16 LAB — BASIC METABOLIC PANEL
BUN: 7 mg/dL (ref 6–23)
CALCIUM: 7.9 mg/dL — AB (ref 8.4–10.5)
CO2: 23 mEq/L (ref 19–32)
CREATININE: 0.43 mg/dL — AB (ref 0.50–1.10)
Chloride: 111 mEq/L (ref 96–112)
GFR calc Af Amer: >90 mL/min (ref >90)
GFR calc non Af Amer: >90 mL/min (ref >90)
GLUCOSE: 142 mg/dL — AB (ref 70–99)
Potassium: 4.3 mEq/L (ref 3.7–5.3)
Sodium: 145 mEq/L (ref 137–147)

## 2013-12-16 LAB — CREATININE, SERUM
Creatinine, Ser: 0.51 mg/dL (ref 0.50–1.10)
GFR calc Af Amer: >90 mL/min (ref >90)

## 2013-12-16 LAB — POCT I-STAT, CHEM 8
BUN: 6 mg/dL (ref 6–23)
CHLORIDE: 105 meq/L (ref 96–112)
Calcium, Ion: 1.3 mmol/L (ref 1.13–1.30)
Creatinine, Ser: 0.6 mg/dL (ref 0.50–1.10)
GLUCOSE: 111 mg/dL — AB (ref 70–99)
HEMATOCRIT: 29 % — AB (ref 36.0–46.0)
HEMOGLOBIN: 9.9 g/dL — AB (ref 12.0–15.0)
Potassium: 4.1 mEq/L (ref 3.7–5.3)
SODIUM: 144 meq/L (ref 137–147)
TCO2: 28 mmol/L (ref 0–100)

## 2013-12-16 LAB — POCT I-STAT 3, ART BLOOD GAS (G3+)
ACID-BASE DEFICIT: 1 mmol/L (ref 0.0–2.0)
Bicarbonate: 25.3 mEq/L — ABNORMAL HIGH (ref 20.0–24.0)
O2 Saturation: 96 %
PCO2 ART: 47.3 mmHg — AB (ref 35.0–45.0)
PH ART: 7.332 — AB (ref 7.350–7.450)
Patient temperature: 36.2
TCO2: 27 mmol/L (ref 0–100)
pO2, Arterial: 84 mmHg (ref 80.0–100.0)

## 2013-12-16 LAB — CBC
HCT: 30.1 % — ABNORMAL LOW (ref 36.0–46.0)
HEMATOCRIT: 30.5 % — AB (ref 36.0–46.0)
HEMOGLOBIN: 9.9 g/dL — AB (ref 12.0–15.0)
Hemoglobin: 9.7 g/dL — ABNORMAL LOW (ref 12.0–15.0)
MCH: 29.6 pg (ref 26.0–34.0)
MCH: 29.8 pg (ref 26.0–34.0)
MCHC: 32.5 g/dL (ref 30.0–36.0)
MCHC: 32.2 g/dL (ref 30.0–36.0)
MCV: 91 fL (ref 78.0–100.0)
MCV: 92.6 fL (ref 78.0–100.0)
Platelets: 133 10*3/uL — ABNORMAL LOW (ref 150–400)
Platelets: 125 10*3/uL — ABNORMAL LOW (ref 150–400)
RBC: 3.35 MIL/uL — ABNORMAL LOW (ref 3.87–5.11)
RBC: 3.25 MIL/uL — ABNORMAL LOW (ref 3.87–5.11)
RDW: 13.7 % (ref 11.5–15.5)
RDW: 13.9 % (ref 11.5–15.5)
WBC: 6.9 10*3/uL (ref 4.0–10.5)
WBC: 9.6 10*3/uL (ref 4.0–10.5)

## 2013-12-16 LAB — MAGNESIUM
Magnesium: 2.5 mg/dL (ref 1.5–2.5)
Magnesium: 2.4 mg/dL (ref 1.5–2.5)

## 2013-12-16 MED ORDER — BUDESONIDE-FORMOTEROL FUMARATE 160-4.5 MCG/ACT IN AERO
2.0000 | INHALATION_SPRAY | Freq: Two times a day (BID) | RESPIRATORY_TRACT | Status: DC
  Administered 2013-12-17 – 2013-12-24 (×15): 2 via RESPIRATORY_TRACT
  Filled 2013-12-16: qty 6

## 2013-12-16 MED ORDER — ENOXAPARIN SODIUM 40 MG/0.4ML ~~LOC~~ SOLN
40.0000 mg | Freq: Every day | SUBCUTANEOUS | Status: DC
  Administered 2013-12-16 – 2013-12-18 (×3): 40 mg via SUBCUTANEOUS
  Filled 2013-12-16 (×4): qty 0.4

## 2013-12-16 MED ORDER — KETOROLAC TROMETHAMINE 15 MG/ML IJ SOLN
15.0000 mg | Freq: Four times a day (QID) | INTRAMUSCULAR | Status: AC | PRN
  Administered 2013-12-16 – 2013-12-21 (×9): 15 mg via INTRAVENOUS
  Filled 2013-12-16 (×9): qty 1

## 2013-12-16 MED ORDER — BIOTENE DRY MOUTH MT LIQD
15.0000 mL | Freq: Two times a day (BID) | OROMUCOSAL | Status: DC
  Administered 2013-12-16 – 2013-12-19 (×6): 15 mL via OROMUCOSAL

## 2013-12-16 MED ORDER — INSULIN ASPART 100 UNIT/ML ~~LOC~~ SOLN
0.0000 [IU] | SUBCUTANEOUS | Status: DC
  Administered 2013-12-16: 2 [IU] via SUBCUTANEOUS

## 2013-12-16 MED FILL — Potassium Chloride Inj 2 mEq/ML: INTRAVENOUS | Qty: 40 | Status: AC

## 2013-12-16 MED FILL — Magnesium Sulfate Inj 50%: INTRAMUSCULAR | Qty: 10 | Status: AC

## 2013-12-16 MED FILL — Heparin Sodium (Porcine) Inj 1000 Unit/ML: INTRAMUSCULAR | Qty: 30 | Status: AC

## 2013-12-16 NOTE — Plan of Care (Signed)
Problem: Phase II - Intermediate Post-Op Goal: Activity Progressed Outcome: Progressing Up to chair. Dizzy with up to chair and weak. Will try ambulation in am. Dr. Cyndia Bent aware

## 2013-12-16 NOTE — Progress Notes (Addendum)
1 Day Post-Op Procedure(s) (LRB): CORONARY ARTERY BYPASS GRAFTING (CABG) (N/A) INTRAOPERATIVE TRANSESOPHAGEAL ECHOCARDIOGRAM (N/A) Subjective: Intubated and on Precedex but follows commands Not extubated overnight due to anxiety and not able to be calm while weaning Objective: Vital signs in last 24 hours: Temp:  [95.7 F (35.4 C)-97.7 F (36.5 C)] 97.2 F (36.2 C) (05/08 0630) Pulse Rate:  [79-91] 90 (05/08 0700) Cardiac Rhythm:  [-] Atrial paced (05/08 0400) Resp:  [14-38] 17 (05/08 0700) BP: (80-126)/(38-58) 105/55 mmHg (05/08 0700) SpO2:  [86 %-98 %] 97 % (05/08 0700) Arterial Line BP: (78-138)/(41-84) 84/47 mmHg (05/08 0700) FiO2 (%):  [40 %-50 %] 50 % (05/08 0400) Weight:  [61.68 kg (135 lb 15.7 oz)-65.772 kg (145 lb)] 65.772 kg (145 lb) (05/08 0600)  Hemodynamic parameters for last 24 hours: PAP: (22-35)/(11-20) 22/11 mmHg CO:  [3.2 L/min-4.6 L/min] 4.1 L/min CI:  [1.9 L/min/m2-2.8 L/min/m2] 2.5 L/min/m2  Intake/Output from previous day: 05/07 0701 - 05/08 0700 In: 5335.4 [I.V.:3855.4; Blood:180; NG/GT:150; IV Piggyback:1150] Out: 5009 [Urine:4015; Emesis/NG output:250; FGHWE:9937; Chest Tube:330] Intake/Output this shift:    General appearance: sedated but awakens and follows commands Neurologic: intact Heart: regular rate and rhythm, S1, S2 normal, no murmur, click, rub or gallop Lungs: slight wheeze bilaterally Extremities: edema mild Wound: dressings dry  Lab Results:  Recent Labs  12/15/13 1935 12/16/13 0435  WBC 9.0 6.9  HGB 10.4*  10.2* 9.9*  HCT 31.7*  30.0* 30.5*  PLT 144* 133*   BMET:  Recent Labs  12/13/13 1606  12/15/13 1935 12/16/13 0435  NA 140  < > 144 145  K 4.0  < > 4.3 4.3  CL 103  --  107 111  CO2 19  --   --  23  GLUCOSE 85  < > 124* 142*  BUN 11  --  5* 7  CREATININE 0.44*  --  0.47*  0.50 0.43*  CALCIUM 9.2  --   --  7.9*  < > = values in this interval not displayed.  PT/INR:  Recent Labs  12/15/13 1300  LABPROT  17.0*  INR 1.42   ABG    Component Value Date/Time   PHART 7.332* 12/16/2013 0427   HCO3 25.3* 12/16/2013 0427   TCO2 27 12/16/2013 0427   ACIDBASEDEF 1.0 12/16/2013 0427   O2SAT 96.0 12/16/2013 0427   CBG (last 3)   Recent Labs  12/15/13 1659 12/15/13 1811 12/15/13 1932  GLUCAP 80 109* 112*   CXR: mild left base atelectasis.  ECG: sinus brady 51 Otherwise normal. Assessment/Plan: S/P Procedure(s) (LRB): CORONARY ARTERY BYPASS GRAFTING (CABG) (N/A) INTRAOPERATIVE TRANSESOPHAGEAL ECHOCARDIOGRAM (N/A)  She is hemodynamically stable  CXR looks ok and ABG ok this am so will extubate on Precedex and wean it off.  Wean neo as tolerated Diurese Diabetes: glucose under good control. Continue SSI. HgbA1C was 5.7 preop. COPD and active smoking: Continue bronchodilator, IS    LOS: 1 day    Gaye Pollack 12/16/2013

## 2013-12-16 NOTE — Procedures (Signed)
Extubation Procedure Note  Patient Details:   Name: Patricia [name] DOB: 1940-09-16 MRN: [id number]   Airway Documentation:     Evaluation  O2 sats: stable throughout Complications: No apparent complications Patient did tolerate procedure well. Bilateral Breath Sounds: Clear Suctioning: Oral Yes  Order to extubate now per MD. Pt placed on condensed SICU rapid wean. Pt positive for cuff leak, extubated to 6lpm [name]. No dyspnea or stridor noted after extubation. Pt achieved around 610mL x 3 on IS. All vitals are within normal limits. RT will continue to monitor.  Mariam Dollar 12/16/2013, 8:38 AM

## 2013-12-16 NOTE — Progress Notes (Signed)
Patient ID: Patricia [name], female   DOB: 1941-02-19, 73 y.o.   MRN: [id number]  SICU Evening Rounds:  Hemodynamically stable  Extubated this am and has done well but complains of a lot of pain. Toradol started.  Urine output ok.  CBC    Component Value Date/Time   WBC 9.6 12/16/2013 1700   RBC 3.25* 12/16/2013 1700   HGB 9.9* 12/16/2013 1709   HCT 29.0* 12/16/2013 1709   PLT 125* 12/16/2013 1700   MCV 92.6 12/16/2013 1700   MCH 29.8 12/16/2013 1700   MCHC 32.2 12/16/2013 1700   RDW 13.9 12/16/2013 1700   LYMPHSABS 2.4 07/11/2013 1543   MONOABS 0.4 07/11/2013 1543   EOSABS 0.1 07/11/2013 1543   BASOSABS 0.1 07/11/2013 1543    BMET    Component Value Date/Time   NA 144 12/16/2013 1709   K 4.1 12/16/2013 1709   CL 105 12/16/2013 1709   CO2 23 12/16/2013 0435   GLUCOSE 111* 12/16/2013 1709   BUN 6 12/16/2013 1709   CREATININE 0.60 12/16/2013 1709   CREATININE 0.60 02/06/2012 1524   CALCIUM 7.9* 12/16/2013 0435   GFRNONAA >90 12/16/2013 0435   GFRAA >90 12/16/2013 0435    A/P: stable. Continue present course.

## 2013-12-17 ENCOUNTER — Inpatient Hospital Stay (HOSPITAL_COMMUNITY): Payer: Medicare Other

## 2013-12-17 LAB — BASIC METABOLIC PANEL
BUN: 10 mg/dL (ref 6–23)
CALCIUM: 8.3 mg/dL — AB (ref 8.4–10.5)
CO2: 29 mEq/L (ref 19–32)
Chloride: 106 mEq/L (ref 96–112)
Creatinine, Ser: 0.5 mg/dL (ref 0.50–1.10)
GFR calc non Af Amer: >90 mL/min (ref >90)
Glucose, Bld: 106 mg/dL — ABNORMAL HIGH (ref 70–99)
Potassium: 4.4 mEq/L (ref 3.7–5.3)
Sodium: 140 mEq/L (ref 137–147)

## 2013-12-17 LAB — CBC
HCT: 30.8 % — ABNORMAL LOW (ref 36.0–46.0)
Hemoglobin: 10 g/dL — ABNORMAL LOW (ref 12.0–15.0)
MCH: 30.6 pg (ref 26.0–34.0)
MCHC: 32.5 g/dL (ref 30.0–36.0)
MCV: 94.2 fL (ref 78.0–100.0)
PLATELETS: 126 10*3/uL — AB (ref 150–400)
RBC: 3.27 MIL/uL — ABNORMAL LOW (ref 3.87–5.11)
RDW: 14 % (ref 11.5–15.5)
WBC: 8.6 10*3/uL (ref 4.0–10.5)

## 2013-12-17 LAB — GLUCOSE, CAPILLARY
GLUCOSE-CAPILLARY: 95 mg/dL (ref 70–99)
GLUCOSE-CAPILLARY: 118 mg/dL — AB (ref 70–99)
GLUCOSE-CAPILLARY: 112 mg/dL — AB (ref 70–99)
Glucose-Capillary: 109 mg/dL — ABNORMAL HIGH (ref 70–99)
Glucose-Capillary: 53 mg/dL — ABNORMAL LOW (ref 70–99)
Glucose-Capillary: 56 mg/dL — ABNORMAL LOW (ref 70–99)

## 2013-12-17 MED ORDER — FUROSEMIDE 10 MG/ML IJ SOLN
40.0000 mg | Freq: Two times a day (BID) | INTRAMUSCULAR | Status: AC
  Administered 2013-12-17 (×2): 40 mg via INTRAVENOUS
  Filled 2013-12-17 (×2): qty 4

## 2013-12-17 MED ORDER — TRAMADOL HCL 50 MG PO TABS
50.0000 mg | ORAL_TABLET | Freq: Four times a day (QID) | ORAL | Status: DC | PRN
  Administered 2013-12-17 – 2013-12-24 (×14): 50 mg via ORAL
  Filled 2013-12-17 (×14): qty 1

## 2013-12-17 MED ORDER — AMIODARONE LOAD VIA INFUSION
150.0000 mg | Freq: Once | INTRAVENOUS | Status: AC
  Administered 2013-12-17: 150 mg via INTRAVENOUS
  Filled 2013-12-17: qty 83.34

## 2013-12-17 MED ORDER — POTASSIUM CHLORIDE CRYS ER 20 MEQ PO TBCR
20.0000 meq | EXTENDED_RELEASE_TABLET | Freq: Two times a day (BID) | ORAL | Status: AC
  Administered 2013-12-17: 20 meq via ORAL
  Filled 2013-12-17 (×2): qty 1

## 2013-12-17 MED ORDER — AMIODARONE HCL IN DEXTROSE 360-4.14 MG/200ML-% IV SOLN
60.0000 mg/h | INTRAVENOUS | Status: AC
  Administered 2013-12-17: 60 mg/h via INTRAVENOUS
  Filled 2013-12-17: qty 200

## 2013-12-17 MED ORDER — AMIODARONE HCL IN DEXTROSE 360-4.14 MG/200ML-% IV SOLN
30.0000 mg/h | INTRAVENOUS | Status: DC
  Administered 2013-12-18 (×2): 30 mg/h via INTRAVENOUS
  Filled 2013-12-17 (×3): qty 200

## 2013-12-17 MED ORDER — POTASSIUM CHLORIDE 10 MEQ/50ML IV SOLN
10.0000 meq | INTRAVENOUS | Status: AC
  Administered 2013-12-17 (×3): 10 meq via INTRAVENOUS
  Filled 2013-12-17: qty 50

## 2013-12-17 MED ORDER — AMIODARONE HCL IN DEXTROSE 360-4.14 MG/200ML-% IV SOLN
INTRAVENOUS | Status: AC
  Filled 2013-12-17: qty 200

## 2013-12-17 NOTE — Progress Notes (Signed)
Patient ID: Patricia [name], female   DOB: Dec 15, 1940, 73 y.o.   MRN: [id number]  SICU Evening rounds:  Hemodynamically stable  Runs of A-fib this pm. Will start on amio  She has been complaining of a lot of chest wall pain today and not following sternal precautions at all. She has been receiving narcotics and toradol for pain and has been loopy all day.  Diuresing well

## 2013-12-17 NOTE — Progress Notes (Signed)
2 Days Post-Op Procedure(s) (LRB): CORONARY ARTERY BYPASS GRAFTING (CABG) (N/A) INTRAOPERATIVE TRANSESOPHAGEAL ECHOCARDIOGRAM (N/A) Subjective:  No specific complaints   Objective: Vital signs in last 24 hours: Temp:  [97.4 F (36.3 C)-99 F (37.2 C)] 99 F (37.2 C) (05/09 0400) Pulse Rate:  [84-94] 94 (05/09 0700) Cardiac Rhythm:  [-] Atrial paced (05/09 0400) Resp:  [9-26] 16 (05/09 0700) BP: (73-149)/(30-129) 115/47 mmHg (05/09 0700) SpO2:  [90 %-100 %] 91 % (05/09 0834) Arterial Line BP: (97-208)/(48-203) 208/203 mmHg (05/08 1700) Weight:  [71.2 kg (156 lb 15.5 oz)] 71.2 kg (156 lb 15.5 oz) (05/09 2694)  Hemodynamic parameters for last 24 hours:    Intake/Output from previous day: 05/08 0701 - 05/09 0700 In: 2208.5 [P.O.:1380; I.V.:728.5; IV Piggyback:100] Out: 1140 [Urine:660; Chest Tube:480] Intake/Output this shift: Total I/O In: 10 [I.V.:10] Out: -   General appearance: a little sedated on pain meds Neurologic: intact Heart: regular rate and rhythm, S1, S2 normal, no murmur, click, rub or gallop Lungs: clear to auscultation bilaterally Extremities: edema mild Wound: dressing dry  Lab Results:  Recent Labs  12/16/13 1700 12/16/13 1709 12/17/13 0330  WBC 9.6  --  8.6  HGB 9.7* 9.9* 10.0*  HCT 30.1* 29.0* 30.8*  PLT 125*  --  126*   BMET:  Recent Labs  12/16/13 0435  12/16/13 1709 12/17/13 0330  NA 145  --  144 140  K 4.3  --  4.1 4.4  CL 111  --  105 106  CO2 23  --   --  29  GLUCOSE 142*  --  111* 106*  BUN 7  --  6 10  CREATININE 0.43*  < > 0.60 0.50  CALCIUM 7.9*  --   --  8.3*  < > = values in this interval not displayed.  PT/INR:  Recent Labs  12/15/13 1300  LABPROT 17.0*  INR 1.42   ABG    Component Value Date/Time   PHART 7.332* 12/16/2013 0427   HCO3 25.3* 12/16/2013 0427   TCO2 28 12/16/2013 1709   ACIDBASEDEF 1.0 12/16/2013 0427   O2SAT 96.0 12/16/2013 0427   CBG (last 3)   Recent Labs  12/17/13 0100 12/17/13 0139  12/17/13 0340  GLUCAP 53* 95 109*   CXR: ok   Assessment/Plan: S/P Procedure(s) (LRB): CORONARY ARTERY BYPASS GRAFTING (CABG) (N/A) INTRAOPERATIVE TRANSESOPHAGEAL ECHOCARDIOGRAM (N/A) Hemodynamically stable Remove chest tube Continue IS, ambulation Start diuresis.   LOS: 2 days    Patricia [name] 12/17/2013

## 2013-12-18 ENCOUNTER — Inpatient Hospital Stay (HOSPITAL_COMMUNITY): Payer: Medicare Other

## 2013-12-18 LAB — BASIC METABOLIC PANEL
BUN: 13 mg/dL (ref 6–23)
CO2: 27 mEq/L (ref 19–32)
Calcium: 8.2 mg/dL — ABNORMAL LOW (ref 8.4–10.5)
Chloride: 103 mEq/L (ref 96–112)
Creatinine, Ser: 0.57 mg/dL (ref 0.50–1.10)
GFR calc non Af Amer: 90 mL/min — ABNORMAL LOW (ref >90)
Glucose, Bld: 114 mg/dL — ABNORMAL HIGH (ref 70–99)
POTASSIUM: 4.6 meq/L (ref 3.7–5.3)
SODIUM: 139 meq/L (ref 137–147)

## 2013-12-18 LAB — CBC
HCT: 30.7 % — ABNORMAL LOW (ref 36.0–46.0)
HEMOGLOBIN: 9.7 g/dL — AB (ref 12.0–15.0)
MCH: 29.9 pg (ref 26.0–34.0)
MCHC: 31.6 g/dL (ref 30.0–36.0)
MCV: 94.8 fL (ref 78.0–100.0)
Platelets: 139 10*3/uL — ABNORMAL LOW (ref 150–400)
RBC: 3.24 MIL/uL — ABNORMAL LOW (ref 3.87–5.11)
RDW: 14 % (ref 11.5–15.5)
WBC: 8.8 10*3/uL (ref 4.0–10.5)

## 2013-12-18 MED ORDER — AMIODARONE HCL 200 MG PO TABS
200.0000 mg | ORAL_TABLET | Freq: Two times a day (BID) | ORAL | Status: DC
  Administered 2013-12-18 – 2013-12-24 (×13): 200 mg via ORAL
  Filled 2013-12-18 (×16): qty 1

## 2013-12-18 MED ORDER — FUROSEMIDE 10 MG/ML IJ SOLN
40.0000 mg | Freq: Two times a day (BID) | INTRAMUSCULAR | Status: DC
  Administered 2013-12-18 – 2013-12-19 (×3): 40 mg via INTRAVENOUS
  Filled 2013-12-18 (×5): qty 4

## 2013-12-18 MED ORDER — POTASSIUM CHLORIDE CRYS ER 20 MEQ PO TBCR
20.0000 meq | EXTENDED_RELEASE_TABLET | Freq: Two times a day (BID) | ORAL | Status: DC
  Administered 2013-12-18 (×2): 20 meq via ORAL
  Filled 2013-12-18 (×4): qty 1

## 2013-12-18 MED ORDER — GUAIFENESIN ER 600 MG PO TB12
600.0000 mg | ORAL_TABLET | Freq: Two times a day (BID) | ORAL | Status: DC
  Administered 2013-12-18 – 2013-12-24 (×14): 600 mg via ORAL
  Filled 2013-12-18 (×17): qty 1

## 2013-12-18 NOTE — Progress Notes (Signed)
3 Days Post-Op Procedure(s) (LRB): CORONARY ARTERY BYPASS GRAFTING (CABG) (N/A) INTRAOPERATIVE TRANSESOPHAGEAL ECHOCARDIOGRAM (N/A) Subjective:  Sore. Coughing but not bringing up anything  Objective: Vital signs in last 24 hours: Temp:  [97.9 F (36.6 C)-99.9 F (37.7 C)] 98 F (36.7 C) (05/10 0800) Pulse Rate:  [58-106] 62 (05/10 1100) Cardiac Rhythm:  [-] Normal sinus rhythm (05/10 0800) Resp:  [12-25] 21 (05/10 1100) BP: (93-146)/(35-100) 116/77 mmHg (05/10 1100) SpO2:  [86 %-99 %] 92 % (05/10 1100) Weight:  [70.9 kg (156 lb 4.9 oz)] 70.9 kg (156 lb 4.9 oz) (05/10 0600)  Hemodynamic parameters for last 24 hours:    Intake/Output from previous day: 05/09 0701 - 05/10 0700 In: 1413.4 [P.O.:660; I.V.:603.4; IV Piggyback:150] Out: 2610 [Urine:2510; Chest Tube:100] Intake/Output this shift: Total I/O In: 386.8 [P.O.:240; I.V.:146.8] Out: 200 [Urine:200]  General appearance: alert and cooperative Neurologic: intact Heart: regular rate and rhythm, S1, S2 normal, no murmur, click, rub or gallop Lungs: diminished breath sounds bibasilar Extremities: edema mild Wound: incision  ok, sternum feels stable  Lab Results:  Recent Labs  12/17/13 0330 12/18/13 0401  WBC 8.6 8.8  HGB 10.0* 9.7*  HCT 30.8* 30.7*  PLT 126* 139*   BMET:  Recent Labs  12/17/13 0330 12/18/13 0401  NA 140 139  K 4.4 4.6  CL 106 103  CO2 29 27  GLUCOSE 106* 114*  BUN 10 13  CREATININE 0.50 0.57  CALCIUM 8.3* 8.2*    PT/INR:  Recent Labs  12/15/13 1300  LABPROT 17.0*  INR 1.42   ABG    Component Value Date/Time   PHART 7.332* 12/16/2013 0427   HCO3 25.3* 12/16/2013 0427   TCO2 28 12/16/2013 1709   ACIDBASEDEF 1.0 12/16/2013 0427   O2SAT 96.0 12/16/2013 0427   CBG (last 3)   Recent Labs  12/17/13 0340 12/17/13 1152 12/17/13 1543  GLUCAP 109* 118* 112*   CXR:  Bibasilar atelectasis and small effusions.  Assessment/Plan: S/P Procedure(s) (LRB): CORONARY ARTERY BYPASS GRAFTING  (CABG) (N/A) INTRAOPERATIVE TRANSESOPHAGEAL ECHOCARDIOGRAM (N/A) Mobilize Diuresis Diabetes control Mucinex Keep in SICU since she needs a lot of attention to pulmonary toilet.   LOS: 3 days    Patricia [name] 12/18/2013

## 2013-12-19 ENCOUNTER — Inpatient Hospital Stay (HOSPITAL_COMMUNITY): Payer: Medicare Other

## 2013-12-19 ENCOUNTER — Encounter (HOSPITAL_COMMUNITY): Payer: Medicare Other

## 2013-12-19 LAB — BASIC METABOLIC PANEL
BUN: 12 mg/dL (ref 6–23)
CHLORIDE: 100 meq/L (ref 96–112)
CO2: 30 mEq/L (ref 19–32)
Calcium: 8.4 mg/dL (ref 8.4–10.5)
Creatinine, Ser: 0.48 mg/dL — ABNORMAL LOW (ref 0.50–1.10)
GFR calc non Af Amer: >90 mL/min (ref >90)
Glucose, Bld: 105 mg/dL — ABNORMAL HIGH (ref 70–99)
POTASSIUM: 3.9 meq/L (ref 3.7–5.3)
Sodium: 140 mEq/L (ref 137–147)

## 2013-12-19 LAB — GLUCOSE, CAPILLARY: Glucose-Capillary: 134 mg/dL — ABNORMAL HIGH (ref 70–99)

## 2013-12-19 MED ORDER — SODIUM CHLORIDE 0.9 % IJ SOLN: 3.0000 mL | INTRAMUSCULAR | Status: DC | PRN

## 2013-12-19 MED ORDER — AMOXICILLIN-POT CLAVULANATE 875-125 MG PO TABS
1.0000 | ORAL_TABLET | Freq: Two times a day (BID) | ORAL | Status: DC
  Administered 2013-12-19 – 2013-12-24 (×12): 1 via ORAL
  Filled 2013-12-19 (×14): qty 1

## 2013-12-19 MED ORDER — MOVING RIGHT ALONG BOOK
Freq: Once | Status: AC
  Administered 2013-12-19: 14:00:00
  Filled 2013-12-19: qty 1

## 2013-12-19 MED ORDER — POTASSIUM CHLORIDE CRYS ER 20 MEQ PO TBCR
40.0000 meq | EXTENDED_RELEASE_TABLET | Freq: Once | ORAL | Status: AC
  Administered 2013-12-19: 40 meq via ORAL
  Filled 2013-12-19: qty 2

## 2013-12-19 MED ORDER — BISACODYL 10 MG RE SUPP: 10.0000 mg | Freq: Every day | RECTAL | Status: DC | PRN

## 2013-12-19 MED ORDER — PANTOPRAZOLE SODIUM 40 MG PO TBEC
40.0000 mg | DELAYED_RELEASE_TABLET | Freq: Every day | ORAL | Status: DC
  Administered 2013-12-20 – 2013-12-25 (×6): 40 mg via ORAL
  Filled 2013-12-19 (×6): qty 1

## 2013-12-19 MED ORDER — ONDANSETRON HCL 4 MG/2ML IJ SOLN: 4.0000 mg | Freq: Four times a day (QID) | INTRAMUSCULAR | Status: DC | PRN

## 2013-12-19 MED ORDER — SODIUM CHLORIDE 0.9 % IJ SOLN
3.0000 mL | Freq: Two times a day (BID) | INTRAMUSCULAR | Status: DC
  Administered 2013-12-19 – 2013-12-24 (×10): 3 mL via INTRAVENOUS

## 2013-12-19 MED ORDER — ONDANSETRON HCL 4 MG PO TABS: 4.0000 mg | ORAL_TABLET | Freq: Four times a day (QID) | ORAL | Status: DC | PRN

## 2013-12-19 MED ORDER — POTASSIUM CHLORIDE CRYS ER 20 MEQ PO TBCR
20.0000 meq | EXTENDED_RELEASE_TABLET | Freq: Two times a day (BID) | ORAL | Status: DC
  Administered 2013-12-20 – 2013-12-23 (×7): 20 meq via ORAL
  Filled 2013-12-19 (×10): qty 1

## 2013-12-19 MED ORDER — BISACODYL 5 MG PO TBEC: 10.0000 mg | DELAYED_RELEASE_TABLET | Freq: Every day | ORAL | Status: DC | PRN

## 2013-12-19 MED ORDER — ASPIRIN EC 325 MG PO TBEC
325.0000 mg | DELAYED_RELEASE_TABLET | Freq: Every day | ORAL | Status: DC
  Administered 2013-12-20 – 2013-12-24 (×5): 325 mg via ORAL
  Filled 2013-12-19 (×6): qty 1

## 2013-12-19 MED ORDER — FUROSEMIDE 40 MG PO TABS
40.0000 mg | ORAL_TABLET | Freq: Two times a day (BID) | ORAL | Status: DC
  Administered 2013-12-20 – 2013-12-23 (×7): 40 mg via ORAL
  Filled 2013-12-19 (×9): qty 1

## 2013-12-19 MED ORDER — FUROSEMIDE 10 MG/ML IJ SOLN
80.0000 mg | Freq: Once | INTRAMUSCULAR | Status: AC
  Administered 2013-12-19: 40 mg via INTRAVENOUS
  Filled 2013-12-19: qty 8

## 2013-12-19 MED ORDER — DOCUSATE SODIUM 100 MG PO CAPS
200.0000 mg | ORAL_CAPSULE | Freq: Every day | ORAL | Status: DC
  Filled 2013-12-19 (×7): qty 2

## 2013-12-19 MED ORDER — SODIUM CHLORIDE 0.9 % IV SOLN: 250.0000 mL | INTRAVENOUS | Status: DC | PRN

## 2013-12-19 NOTE — Progress Notes (Signed)
Pt found with sats of 81% on room air and wheezes in the lower bases Xopenex neb given ant placed back on 4 lt nasal cannula sats improved to 91%.

## 2013-12-19 NOTE — Progress Notes (Signed)
CARDIAC REHAB PHASE I   PRE:  Rate/Rhythm:76 SR  BP:  Sitting: 150/61      SaO2: 92 2L  MODE:  Ambulation: 150 ft   POST:  Rate/Rhythm: 86 SR  BP:  Sitting: 150/54     SaO2: 92 2L 1420-1500 Pt ambulated with RWx1 assist. Gait slightly unsteady. 1 standing and 1 sitting break taken for moderate amt of sternal incision pain and generalized weakness. Denies DOE. Ambulatory sat 90 on 2L. Post ambulation, pt to chair. Congested cough noted. Stressed importance of IS and flutter valve use. Pt verbalized understanding and demonstrated use. Call be and phone at pt bedside.  Tiwan Schnitker English PayneRN, BSN 12/19/2013 2:59 PM

## 2013-12-19 NOTE — Progress Notes (Signed)
4 Days Post-Op Procedure(s) (LRB): CORONARY ARTERY BYPASS GRAFTING (CABG) (N/A) INTRAOPERATIVE TRANSESOPHAGEAL ECHOCARDIOGRAM (N/A) Subjective:  No complaints Feeling better  Objective: Vital signs in last 24 hours: Temp:  [97.5 F (36.4 C)-98 F (36.7 C)] 97.8 F (36.6 C) (05/11 0400) Pulse Rate:  [58-75] 75 (05/11 0700) Cardiac Rhythm:  [-] Normal sinus rhythm (05/11 0730) Resp:  [15-29] 20 (05/11 0700) BP: (85-153)/(34-82) 153/50 mmHg (05/11 0700) SpO2:  [91 %-100 %] 99 % (05/11 0700) Weight:  [69.7 kg (153 lb 10.6 oz)] 69.7 kg (153 lb 10.6 oz) (05/11 0500)  Hemodynamic parameters for last 24 hours:    Intake/Output from previous day: 05/10 0701 - 05/11 0700 In: 1343.5 [P.O.:780; I.V.:563.5] Out: 3030 [Urine:3030] Intake/Output this shift:    General appearance: alert and cooperative, rattling cough Neurologic: intact Heart: regular rate and rhythm, S1, S2 normal, no murmur, click, rub or gallop Lungs: diminished breath sounds bibasilar Extremities: edema mild Wound: incisions ok  Lab Results:  Recent Labs  12/17/13 0330 12/18/13 0401  WBC 8.6 8.8  HGB 10.0* 9.7*  HCT 30.8* 30.7*  PLT 126* 139*   BMET:  Recent Labs  12/18/13 0401 12/19/13 0230  NA 139 140  K 4.6 3.9  CL 103 100  CO2 27 30  GLUCOSE 114* 105*  BUN 13 12  CREATININE 0.57 0.48*  CALCIUM 8.2* 8.4    PT/INR: No results found for this basename: LABPROT, INR,  in the last 72 hours ABG    Component Value Date/Time   PHART 7.332* 12/16/2013 0427   HCO3 25.3* 12/16/2013 0427   TCO2 28 12/16/2013 1709   ACIDBASEDEF 1.0 12/16/2013 0427   O2SAT 96.0 12/16/2013 0427   CBG (last 3)   Recent Labs  12/17/13 0340 12/17/13 1152 12/17/13 1543  GLUCAP 109* 118* 112*   CLINICAL DATA: CABG.  EXAM:  PORTABLE CHEST - 1 VIEW  COMPARISON: Chest x-ray 12/18/2013.  FINDINGS:  There is a right internal jugular cordis with tip in the proximal  superior vena cava. Low lung volumes with bibasilar  opacities which  are favored to represent a combination of subsegmental atelectasis  and overlying small-to-moderate bilateral pleural effusions. No  pneumothorax. No evidence of pulmonary edema. Heart size is normal.  Mediastinal contours are unremarkable. Atherosclerosis in the  thoracic aorta. Median sternotomy wires from CABG.  IMPRESSION:  Allowing for slight differences in patient positioning there is no  significant change in the radiographic appearance of the chest, as  detailed above.  Electronically Signed  By: Vinnie Langton M.D.  On: 12/19/2013 08:04   Assessment/Plan: S/P Procedure(s) (LRB): CORONARY ARTERY BYPASS GRAFTING (CABG) (N/A) INTRAOPERATIVE TRANSESOPHAGEAL ECHOCARDIOGRAM (N/A) Mobilize Diuresis Pulmonary toilet Expected acute blood loss anemia: stable Will add Augmentin for possible bronchitis Plan for transfer to step-down: see transfer orders   LOS: 4 days    Gaye Pollack 12/19/2013

## 2013-12-20 ENCOUNTER — Inpatient Hospital Stay (HOSPITAL_COMMUNITY): Payer: Medicare Other

## 2013-12-20 LAB — BASIC METABOLIC PANEL WITH GFR
BUN: 10 mg/dL (ref 6–23)
CO2: 32 meq/L (ref 19–32)
Calcium: 8.7 mg/dL (ref 8.4–10.5)
Chloride: 101 meq/L (ref 96–112)
Creatinine, Ser: 0.42 mg/dL — ABNORMAL LOW (ref 0.50–1.10)
GFR calc Af Amer: >90 mL/min
GFR calc non Af Amer: >90 mL/min
Glucose, Bld: 83 mg/dL (ref 70–99)
Potassium: 3.7 meq/L (ref 3.7–5.3)
Sodium: 141 meq/L (ref 137–147)

## 2013-12-20 LAB — CBC
HCT: 32.5 % — ABNORMAL LOW (ref 36.0–46.0)
Hemoglobin: 10.3 g/dL — ABNORMAL LOW (ref 12.0–15.0)
MCH: 29.9 pg (ref 26.0–34.0)
MCHC: 31.7 g/dL (ref 30.0–36.0)
MCV: 94.2 fL (ref 78.0–100.0)
Platelets: 169 K/uL (ref 150–400)
RBC: 3.45 MIL/uL — ABNORMAL LOW (ref 3.87–5.11)
RDW: 13.9 % (ref 11.5–15.5)
WBC: 5.9 K/uL (ref 4.0–10.5)

## 2013-12-20 MED ORDER — LEVALBUTEROL HCL 0.63 MG/3ML IN NEBU
0.6300 mg | INHALATION_SOLUTION | Freq: Four times a day (QID) | RESPIRATORY_TRACT | Status: DC
  Administered 2013-12-20 – 2013-12-21 (×6): 0.63 mg via RESPIRATORY_TRACT
  Filled 2013-12-20 (×17): qty 3

## 2013-12-20 MED ORDER — METOPROLOL TARTRATE 12.5 MG HALF TABLET
12.5000 mg | ORAL_TABLET | Freq: Two times a day (BID) | ORAL | Status: DC
  Administered 2013-12-20 (×2): 12.5 mg via ORAL
  Filled 2013-12-20 (×4): qty 1

## 2013-12-20 MED ORDER — BENZONATATE 100 MG PO CAPS
100.0000 mg | ORAL_CAPSULE | Freq: Three times a day (TID) | ORAL | Status: DC | PRN
  Administered 2013-12-20: 100 mg via ORAL
  Filled 2013-12-20 (×2): qty 1

## 2013-12-20 NOTE — Progress Notes (Signed)
CARDIAC REHAB PHASE I   PRE:  Rate/Rhythm: 83 SR    BP: sitting 149/41    SaO2: 94 2L  MODE:  Ambulation: 230 ft   POST:  Rate/Rhythm: 106 ST    BP: sitting 100/74     SaO2: wouldn't register, fingers cold  Pt c/o pain, generally not feeling well. Able to stand with min assist and walk with RW, used gait belt. X1 rest then able to increase distance. Encouraged x3 walks today to clear congestion, also IS and flutter valve. To bed. Needs encouragement but then willing to walk. Main c/o is pain. [place], ACSM 12/20/2013 9:49 AM

## 2013-12-20 NOTE — Progress Notes (Signed)
Pt continues to have wheezes bilateraly in the  lower lobes. Xopenex nebs changed to Q6 hr.

## 2013-12-20 NOTE — Progress Notes (Addendum)
       [address]       [place] [address]             [phone]          5 Days Post-Op Procedure(s) (LRB): CORONARY ARTERY BYPASS GRAFTING (CABG) (N/A) INTRAOPERATIVE TRANSESOPHAGEAL ECHOCARDIOGRAM (N/A)  Subjective: Feels weak, sore from nonproductive cough.   Objective: Vital signs in last 24 hours: Patient Vitals for the past 24 hrs:  BP Temp Temp src Pulse Resp SpO2 Weight  12/20/13 0637 157/71 mmHg 97.9 F (36.6 C) Oral 82 17 97 % -  12/20/13 0500 - - - - - - 148 lb 13 oz (67.5 kg)  12/20/13 0407 - - - - - 98 % -  12/19/13 2042 151/67 mmHg 97.8 F (36.6 C) Oral 81 18 82 % -  12/19/13 2025 - - - - - 91 % -  12/19/13 1536 150/54 mmHg 98.2 F (36.8 C) Oral 75 20 98 % -  12/19/13 1200 - - - - - 98 % -  12/19/13 1100 138/49 mmHg - - 74 17 99 % -  12/19/13 1000 144/120 mmHg - - 79 21 96 % -  12/19/13 0900 140/36 mmHg - - 82 18 100 % -  12/19/13 0809 - 97.7 F (36.5 C) Oral - - - -   Current Weight  12/20/13 148 lb 13 oz (67.5 kg)     Intake/Output from previous day: 05/11 0701 - 05/12 0700 In: 280 [P.O.:240; I.V.:40] Out: 2275 [Urine:2275]    PHYSICAL EXAM:  Heart: RRR Lungs: Decreased BS in bases Wound: Clean and dry Extremities: Mild LE edema bilaterally    Lab Results: CBC: Recent Labs  12/18/13 0401 12/20/13 0349  WBC 8.8 5.9  HGB 9.7* 10.3*  HCT 30.7* 32.5*  PLT 139* 169   BMET:  Recent Labs  12/19/13 0230 12/20/13 0349  NA 140 141  K 3.9 3.7  CL 100 101  CO2 30 32  GLUCOSE 105* 83  BUN 12 10  CREATININE 0.48* 0.42*  CALCIUM 8.4 8.7    PT/INR: No results found for this basename: LABPROT, INR,  in the last 72 hours  CXR: FINDINGS:  The venous sheath has been removed from the SVC. No pneumothorax is  noted. There are bibasilar opacities representing pleural effusions  and basilar atelectasis. Mild cardiomegaly is stable. Median  sternotomy sutures are noted.  IMPRESSION:  Persistent bilateral pleural  effusions and bibasilar atelectasis   Assessment/Plan: S/P Procedure(s) (LRB): CORONARY ARTERY BYPASS GRAFTING (CABG) (N/A) INTRAOPERATIVE TRANSESOPHAGEAL ECHOCARDIOGRAM (N/A)  CV- maintaining SR, BPs trending up. Not on a beta blocker.  Will start low dose Lopressor, continue Amiodarone and watch.  Vol overload/bilateral effusions - diurese.  Pulm- D#2 Augmentin for presumed bronchitis.  Mucinex not helping with cough.  Will add Tessalon Perles. Continue pulm toilet and wean O2 as tolerated.  CRPI/ambulation.    LOS: 5 days    Coolidge Breeze 12/20/2013   Chart reviewed, patient examined, agree with above. CXR still shows bilateral moderate pleural effusions. Weight is down 5 lbs from yesterday but still 12 lbs over preop. Continue diuresis. She has a chronic cough from smoking but probably made worse by effusions and compressive atelectasis.

## 2013-12-21 MED ORDER — METOPROLOL TARTRATE 25 MG PO TABS
25.0000 mg | ORAL_TABLET | Freq: Two times a day (BID) | ORAL | Status: DC
  Administered 2013-12-21 – 2013-12-24 (×7): 25 mg via ORAL
  Filled 2013-12-21 (×10): qty 1

## 2013-12-21 NOTE — Care Management Note (Signed)
    Page 1 of 1   12/21/2013     10:15:50 AM CARE MANAGEMENT NOTE 12/21/2013  Patient:  Patricia [name], Patricia [name]   Account Number:  [id number]  Date Initiated:  12/21/2013  Documentation initiated by:  Elissa Hefty  Subjective/Objective Assessment:   adm w cabg x3     Action/Plan:   lives w fam, pcp dr Jacqulyn [facility] paz   Anticipated DC Date:     Anticipated DC Plan:    In-house referral  Clinical Social Worker         Choice offered to / List presented to:             Status of service:   Medicare Important Message given?  YES (If response is "NO", the following Medicare IM given date fields will be blank) Date Medicare IM given:  12/21/2013 Date Additional Medicare IM given:    Discharge Disposition:    Per UR Regulation:  Reviewed for med. necessity/level of care/duration of stay  If discussed at [place] of Stay Meetings, dates discussed:   12/22/2013    Comments:  5/13 1014 debbie Taha Dimond rn,bsn spoke w pt. her grandson lives w her. she will not have 24/7 coverage for first 10-14 days. she would like [facility] for short terms snf. sw ref made.

## 2013-12-21 NOTE — Progress Notes (Addendum)
       [address]       [place] [address]             [phone]          6 Days Post-Op Procedure(s) (LRB): CORONARY ARTERY BYPASS GRAFTING (CABG) (N/A) INTRAOPERATIVE TRANSESOPHAGEAL ECHOCARDIOGRAM (N/A)  Subjective: Feels much better today.  Cough significantly decreased and rested better.   Objective: Vital signs in last 24 hours: Patient Vitals for the past 24 hrs:  BP Temp Temp src Pulse Resp SpO2 Weight  12/21/13 0500 165/65 mmHg 97.7 F (36.5 C) Oral 70 16 95 % -  12/21/13 0413 - - - - - - 145 lb 15.1 oz (66.2 kg)  12/20/13 1950 148/61 mmHg 98.3 F (36.8 C) Oral 71 16 98 % -  12/20/13 1359 117/74 mmHg 97.1 F (36.2 C) Oral 72 18 95 % -  12/20/13 1000 129/60 mmHg - - - - 98 % -  12/20/13 0955 - - - - - 98 % -   Current Weight  12/21/13 145 lb 15.1 oz (66.2 kg)     Intake/Output from previous day: 05/12 0701 - 05/13 0700 In: 600 [P.O.:600] Out: 1500 [Urine:1500]    PHYSICAL EXAM:  Heart: RRR Lungs: Slightly diminished BS in bases bilaterally Wound:Clean and dry Extremities: Mild LE edema    Lab Results: CBC: Recent Labs  12/20/13 0349  WBC 5.9  HGB 10.3*  HCT 32.5*  PLT 169   BMET:  Recent Labs  12/19/13 0230 12/20/13 0349  NA 140 141  K 3.9 3.7  CL 100 101  CO2 30 32  GLUCOSE 105* 83  BUN 12 10  CREATININE 0.48* 0.42*  CALCIUM 8.4 8.7    PT/INR: No results found for this basename: LABPROT, INR,  in the last 72 hours    Assessment/Plan: S/P Procedure(s) (LRB): CORONARY ARTERY BYPASS GRAFTING (CABG) (N/A) INTRAOPERATIVE TRANSESOPHAGEAL ECHOCARDIOGRAM (N/A)  CV- maintaining SR, BPs still a little elevated.  Will increase Lopressor and monitor pulm status. May need to resume low dose ARB if BPs remain high.  Continue Amiodarone.   Vol overload/bilateral effusions - continue diuresis.   Pulm- D#3 Augmentin for presumed bronchitis. Continue pulm toilet and wean O2 as  tolerated.  CRPI/ambulation.  Disp- pt lives with her grandson, but does not have 24 hr assistance at home post-discharge.  Her sister is in SNF [facility] and she wonders if a short term placement there might be an option.  Will consult CM/CSW for possible SNF.  Hopefully if she continues to progress, she may be ready for d/c by the end of the week.    LOS: 6 days    Coolidge Breeze 12/21/2013   Chart reviewed, patient examined, agree with above.

## 2013-12-21 NOTE — Evaluation (Signed)
Physical Therapy Evaluation Patient Details Name: Patricia [name] MRN: [id number] DOB: Jul 22, 1941 Today's Date: 12/21/2013   History of Present Illness  Patient is a 73 y/o female now s/p CABG x3 due to CAD.  Also with h/o COPD, tobacco abuse, PAD, HTN.  Clinical Impression  Patient presents with decreased independence with mobility due to deficits listed in PT problem list.  She will benefit from skilled PT in the acute setting to allow return home with intermittent assist following STSNF level rehab.  She states sister in [place] and hopes to go there.    Follow Up Recommendations SNF    Equipment Recommendations  Rolling walker with 5" wheels    Recommendations for Other Services       Precautions / Restrictions Precautions Precautions: Sternal;Fall      Mobility  Bed Mobility Overal bed mobility: Needs Assistance Bed Mobility: Sit to Sidelying;Sidelying to Sit   Sidelying to sit: Min assist     Sit to sidelying: Min assist General bed mobility comments: assist for feet in bed with sit to side, assist for trunk upright side to sit  Transfers Overall transfer level: Needs assistance Equipment used: Rolling walker (2 wheeled) Transfers: Sit to/from Stand Sit to Stand: Min assist         General transfer comment: up from chair/bed  Ambulation/Gait Ambulation/Gait assistance: Min guard;Supervision Ambulation Distance (Feet): 400 Feet Assistive device: Rolling walker (2 wheeled) Gait Pattern/deviations: Step-through pattern;Decreased stride length;Trunk flexed     General Gait Details: painful with coughing and flexed due to pain in chest  Stairs            Wheelchair Mobility    Modified Rankin (Stroke Patients Only)       Balance Overall balance assessment: Needs assistance   Sitting balance-Leahy Scale: Fair     Standing balance support: Bilateral upper extremity supported;Single extremity supported Standing balance-Leahy Scale:  Poor Standing balance comment: used walker for ambulation; standing to get item out of bag needed 1 UE assist on counter                             Pertinent Vitals/Pain Comfortable at rest, increased pain in chest coughing (splints with pillow)    Home Living Family/patient expects to be discharged to:: Skilled nursing facility Living Arrangements: Other relatives (grandson) Available Help at Discharge: Available PRN/intermittently Type of Home: House Home Access: Level entry     Home Layout: One level Home Equipment: None      Prior Function Level of Independence: Independent               Hand Dominance   Dominant Hand: Right    Extremity/Trunk Assessment               Lower Extremity Assessment: Generalized weakness         Communication   Communication: No difficulties  Cognition Arousal/Alertness: Awake/alert Behavior During Therapy: WFL for tasks assessed/performed Overall Cognitive Status: Within Functional Limits for tasks assessed                      General Comments      Exercises        Assessment/Plan    PT Assessment Patient needs continued PT services  PT Diagnosis Difficulty walking;Generalized weakness;Abnormality of gait   PT Problem List Decreased strength;Decreased balance;Decreased mobility;Decreased activity tolerance;Decreased knowledge of precautions;Decreased knowledge of use of DME  PT Treatment Interventions  DME instruction;Gait training;Functional mobility training;Therapeutic activities;Therapeutic exercise;Patient/family education;Balance training   PT Goals (Current goals can be found in the Care Plan section) Acute Rehab PT Goals Patient Stated Goal: To go to rehab prior to going home PT Goal Formulation: With patient Time For Goal Achievement: 01/04/14 Potential to Achieve Goals: Good    Frequency Min 3X/week   Barriers to discharge Decreased caregiver support      Co-evaluation                End of Session Equipment Utilized During Treatment: Gait belt;Oxygen Activity Tolerance: Patient tolerated treatment well Patient left: in chair;with call bell/phone within reach           Time: 0855-0922 PT Time Calculation (min): 27 min   Charges:   PT Evaluation $Initial PT Evaluation Tier I: 1 Procedure PT Treatments $Gait Training: 8-22 mins   PT G Codes:          Max Sane 12/21/2013, 10:25 AM Magda Kiel, [place] 12/21/2013

## 2013-12-21 NOTE — Progress Notes (Signed)
Pt refused third ambulation for the shift. RN educated pt on the importance of ambulation. Pt verbalized understanding. Pt resting in bed with call bell within reach. Will continue to monitor.   Coolidge Breeze

## 2013-12-21 NOTE — Progress Notes (Signed)
CARDIAC REHAB PHASE I   PRE:  Rate/Rhythm: 25 SR  BP:  Supine:   Sitting: 133/44  Standing:    SaO2: 90-94%1L  MODE:  Ambulation: 350 ft   POST:  Rate/Rhythm: 79 SR  BP:  Supine:   Sitting: 130/44  Standing:    SaO2: 91%1L 1353-1430 Assisted to bathroom and then to walk. Walked 350 ft on 1L with gait belt use,rolling walker and asst x 1. Stopped a couple of times to rest. Did well on 1L. Encouraged IS and flutter valve. Has nonproductive cough. To recliner with call bell.   Graylon Good, RN BSN  12/21/2013 2:25 PM

## 2013-12-22 LAB — PULMONARY FUNCTION TEST
DL/VA % PRED: 54 %
DL/VA: 2.54 ml/min/mmHg/L
DLCO COR % PRED: 44 %
DLCO UNC: 10.22 ml/min/mmHg
DLCO cor: 10.22 ml/min/mmHg
DLCO unc % pred: 44 %
FEF 25-75 PRE: 0.71 L/s
FEF2575-%Pred-Pre: 42 %
FEV1-%Pred-Pre: 78 %
FEV1-PRE: 1.61 L
FEV1FVC-%Pred-Pre: 75 %
FEV6-%Pred-Pre: 106 %
FEV6-Pre: 2.76 L
FEV6FVC-%PRED-PRE: 102 %
FVC-%PRED-PRE: 103 %
FVC-PRE: 2.84 L
PRE FEV6/FVC RATIO: 97 %
Pre FEV1/FVC ratio: 57 %
RV % PRED: 138 %
RV: 3.06 L
TLC % pred: 121 %
TLC: 5.93 L

## 2013-12-22 MED ORDER — LOSARTAN POTASSIUM 25 MG PO TABS
25.0000 mg | ORAL_TABLET | Freq: Every day | ORAL | Status: DC
  Administered 2013-12-22 – 2013-12-23 (×2): 25 mg via ORAL
  Filled 2013-12-22 (×4): qty 1

## 2013-12-22 MED ORDER — LEVALBUTEROL HCL 0.63 MG/3ML IN NEBU
0.6300 mg | INHALATION_SOLUTION | Freq: Four times a day (QID) | RESPIRATORY_TRACT | Status: DC | PRN
  Administered 2013-12-22: 0.63 mg via RESPIRATORY_TRACT
  Filled 2013-12-22: qty 3

## 2013-12-22 MED FILL — Electrolyte-R (PH 7.4) Solution: INTRAVENOUS | Qty: 4000 | Status: AC

## 2013-12-22 MED FILL — Sodium Bicarbonate IV Soln 8.4%: INTRAVENOUS | Qty: 50 | Status: AC

## 2013-12-22 MED FILL — Sodium Chloride IV Soln 0.9%: INTRAVENOUS | Qty: 2000 | Status: AC

## 2013-12-22 MED FILL — Lidocaine HCl IV Inj 20 MG/ML: INTRAVENOUS | Qty: 5 | Status: AC

## 2013-12-22 MED FILL — Heparin Sodium (Porcine) Inj 1000 Unit/ML: INTRAMUSCULAR | Qty: 30 | Status: AC

## 2013-12-22 MED FILL — Mannitol IV Soln 20%: INTRAVENOUS | Qty: 500 | Status: AC

## 2013-12-22 NOTE — Discharge Summary (Signed)
[address]       [place] [address]             [phone]              Discharge Summary  Name: Patricia [name] DOB: [birth year] 73 y.o. MRN: [id number]   Admission Date: 12/15/2013 Discharge Date:     Admitting Diagnosis: Severe three vessel coronary artery disease   Discharge Diagnosis:  Severe three vessel coronary artery disease Postoperative atrial fibrillation Expected postoperative blood loss anemia Postoperative bronchitis Bilateral pleural effusions  Past Medical History  Diagnosis Date  . Osteopenia   . COPD (chronic obstructive pulmonary disease)   . Hyperlipidemia   . Thyroid nodule   . Abnormal LFTs   . Depression   . HTN (hypertension)     intolerant to ACEi (cough)  . Allergic rhinitis   . Anxiety   . Panic attacks   . PAD (peripheral artery disease)     Carotid dz as  below, and at [facility] 11/12:  critical stenosis noted just below the sheath site leading into a totally occluded SFA and a patent deep femoral artery  . Carotid artery disease     Moderate - f/u dopplers needed 11/2012  . CAD (coronary artery disease)     s/p DES to the circumflex 12/11;  06/2011: complex PCI (POBA) of the CFX/OM bifurcation with IVUS guidance; [place] s/p PTCA/cutting balloon to LCx 06/2012 (chronic subtotally occ RCA with L-R collaterals, residual nonobst LAD dz)   . 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 Shortness of breath     exertional     Procedures: CORONARY ARTERY BYPASS GRAFTING x 3 (Left internal mammary artery to obtuse marginal 1, saphenous vein graft to obtuse marginal 2, saphenous vein graft to right coronary artery) ENDOSCOPIC VEIN HARVEST RIGHT LEG - 12/15/2013     HPI:  The patient is a 73 y.o. female current smoker with COPD, hyperlipidemia, HTN, and know CAD,s/p PCI of the left circumflex with drug eluting stent in 07/2010. In 06/2011, she underwent complex PCI of the LCX/OM bifurcation, and then PTCA/cutting balloon to the left  circumflex  in 06/2012. She continues to have angina on a daily basis that seems to be the worse in the morning on awakening and slowly resolves with nitroglycerin. Surprisingly, she does not have angina with ambulation during the day. She has continued to smoke 1 ppd and has smoked for 55 years. Cardiac cath on 11/28/2013 shows the LAD to be heavily calcified throughout but with no significant stenosis. The LCX has a hypodense in-stent restenosis at the origin of the OM1 that was estimated at 50% but may be worse. The OM1 has 50% ostial and 90% proximal stenosis. There is an OM2 that has no significant disease but is compromised by the proximal LCX stenosis. The RCA is likely the culprit with 90% ostial stenosis and severe diffuse mid-vessel stenosis with to and fro flow in the distal vessel. The LVEF is normal at 55-65%. Aortic arch angiography shows severe calcification of the entire arch and the arch vessels. She was referred to Dr. Cyndia Bent for consideration of surgical revascularization, and he agreed that she would benefit from CABG. All risks, benefits and alternatives of surgery were explained in detail, and the patient agreed to proceed.     Hospital Course:  The patient was admitted to [facility] on 12/15/2013. The patient was taken to the operating room and underwent the  above procedure.    The postoperative course was notable for brief runs of atrial fibrillation on postop day 2 for which she was started on po Amiodarone.  She also developed a productive cough with mild leukocytosis, and was started empirically on antibiotics for presumed bronchitis. She has been volume overloaded, with bilateral pleural effusions on chest x-ray, and has been aggressively diuresed.   Overall, she is doing well.  Her blood pressures have been trending up and she has been started on a beta blocker and restarted on her home ARB with improvement. She is maintaining sinus rhythm. She is ambulating in the halls with  cardiac rehab and physical therapy and is progressing well with mobility.  Her pulmonary status is improved.  She has been weaned down to 1 liter of oxygen and we have continued to work on pulmonary toilet. Incisions are healing well.  She will need a brief skilled nursing placement post-discharge, and currently is medically stable for transfer once a bed is available.       Recent vital signs:  Filed Vitals:   12/22/13 0446  BP: 146/59  Pulse: 87  Temp: 98 F (36.7 C)  Resp: 20    Recent laboratory studies:  CBC: Recent Labs  12/20/13 0349  WBC 5.9  HGB 10.3*  HCT 32.5*  PLT 169   BMET:  Recent Labs  12/20/13 0349  NA 141  K 3.7  CL 101  CO2 32  GLUCOSE 83  BUN 10  CREATININE 0.42*  CALCIUM 8.7    PT/INR: No results found for this basename: LABPROT, INR,  in the last 72 hours   Medication List    STOP taking these medications       ALEVE 220 MG Caps  Generic drug:  Naproxen Sodium     amLODipine 5 MG tablet  Commonly known as:  NORVASC     aspirin 81 MG tablet  Replaced by:  aspirin 325 MG EC tablet     clopidogrel 75 MG tablet  Commonly known as:  PLAVIX     isosorbide mononitrate 60 MG 24 hr tablet  Commonly known as:  IMDUR     losartan-hydrochlorothiazide 100-12.5 MG per tablet  Commonly known as:  HYZAAR     nitroGLYCERIN 0.4 MG SL tablet  Commonly known as:  NITROSTAT      TAKE these medications       amiodarone 200 MG tablet  Commonly known as:  PACERONE  Take 1 tablet (200 mg total) by mouth 2 (two) times daily.     amoxicillin-clavulanate 875-125 MG per tablet  Commonly known as:  AUGMENTIN  Take 1 tablet by mouth every 12 (twelve) hours.     aspirin 325 MG EC tablet  Take 1 tablet (325 mg total) by mouth daily.     benzonatate 100 MG capsule  Commonly known as:  TESSALON  Take 1 capsule (100 mg total) by mouth 3 (three) times daily as needed for cough.     clonazePAM 0.5 MG tablet  Commonly known as:  KLONOPIN  Take 1  tablet (0.5 mg total) by mouth 2 (two) times daily as needed for anxiety.     furosemide 40 MG tablet  Commonly known as:  LASIX  Take 1 tablet (40 mg total) by mouth daily.     guaiFENesin 600 MG 12 hr tablet  Commonly known as:  MUCINEX  Take 1 tablet (600 mg total) by mouth 2 (two) times daily.     losartan  25 MG tablet  Commonly known as:  COZAAR  Take 1 tablet (25 mg total) by mouth daily.     metoprolol tartrate 12.5 mg Tabs tablet  Commonly known as:  LOPRESSOR  Take 0.5 tablets (12.5 mg total) by mouth 2 (two) times daily.     potassium chloride SA 20 MEQ tablet  Commonly known as:  K-DUR,KLOR-CON  Take 1 tablet (20 mEq total) by mouth daily.     simvastatin 20 MG tablet  Commonly known as:  ZOCOR  Take 20 mg by mouth daily.     tiotropium 18 MCG inhalation capsule  Commonly known as:  SPIRIVA  Place 1 capsule (18 mcg total) into inhaler and inhale daily.     traMADol 50 MG tablet  Commonly known as:  ULTRAM  Take 1 tablet (50 mg total) by mouth every 6 (six) hours as needed for moderate pain.     Vitamin D3 1000 UNITS Caps  Take 1,000 Units by mouth daily.       Discharge Instructions:   1. Please obtain vital signs at least one time daily. 2. Please weigh the patient daily. If he or she continues to gain weight or develops lower extremity edema, contact the office at [phone]. 3. Ambulate patient at least three times daily and please use sternal precautions. 4.The patient is to refrain from driving, heavy lifting or strenuous activity.   5. May shower daily and clean incisions with soap and water.   6. Continue heart healthy diet.   Follow Up:       Future Appointments Provider Department Dept Phone   01/13/2014 3:30 PM Colon Branch, MD [facility]     Follow-up Information   Follow up with Sherren Mocha, MD In 2 weeks.   Specialty:  Cardiology   Contact information:   [address] [place] [address] [phone]       Follow up with Gaye Pollack, MD. (Office will contact you with an appointment)    Specialty:  Cardiothoracic Surgery   Contact information:   [address] [place] [address] [phone]      The patient has been discharged on:  1.Beta Blocker: Yes [ x ]  No [ ]   If No, reason:    2.Ace Inhibitor/ARB: Yes [ x ]  No [  ]  If No, reason:    3.Statin: Yes [ x ]  No [ ]   If No, reason:    4.Ecasa: Yes [ x ]  No [ ]   If No, reason:     Coolidge Breeze 12/22/2013, 8:39 AM

## 2013-12-22 NOTE — Progress Notes (Signed)
CARDIAC REHAB PHASE I   PRE:  Rate/Rhythm: 62 SR    BP: sitting 111/73    SaO2: 92-93 1L  MODE:  Ambulation: 700 ft   POST:  Rate/Rhythm: 76 SR    BP: sitting 98/73     SaO2: 90 1L  Tolerated well, continues to progress. Used RW and 1L. Continues to have congestion without production (doesn't cough very hard). Sts she feels well and doubled distance. To recliner. [place], ACSM 12/22/2013 3:04 PM

## 2013-12-22 NOTE — Progress Notes (Signed)
EPW removed per MD order and protocol; wires intact; patient on bedrest for 1 hour; VS Q15 for one hour; call bell within reach; will continue to monitor.  Carollee Sires, RN

## 2013-12-22 NOTE — Progress Notes (Addendum)
Pt has a bed at [facility] for tomorrow (12/23/13). Informed pt's grandson of bed offer and provided his contact info to admissions rep with [facility] to complete paperwork today or tomorrow. CSW following for discharge tomorrow.   Ky Barban, MSW, [facility] Clinical Social Worker [phone]

## 2013-12-22 NOTE — Progress Notes (Signed)
       [address]       [place] [address]             [phone]          7 Days Post-Op Procedure(s) (LRB): CORONARY ARTERY BYPASS GRAFTING (CABG) (N/A) INTRAOPERATIVE TRANSESOPHAGEAL ECHOCARDIOGRAM (N/A)  Subjective: Comfortable, no complaints.  Cough resolved, breathing stable.   Objective: Vital signs in last 24 hours: Patient Vitals for the past 24 hrs:  BP Temp Temp src Pulse Resp SpO2 Weight  12/22/13 0446 146/59 mmHg 98 F (36.7 C) Oral 87 20 90 % 142 lb 13.7 oz (64.8 kg)  12/21/13 2016 - - - - - 92 % -  12/21/13 1957 138/76 mmHg 98.1 F (36.7 C) Oral 76 18 92 % -  12/21/13 1458 - - - - - 97 % -  12/21/13 1356 133/44 mmHg 98.1 F (36.7 C) Oral 65 18 96 % -  12/21/13 0939 - - - - - 97 % -   Current Weight  12/22/13 142 lb 13.7 oz (64.8 kg)     Intake/Output from previous day: 05/13 0701 - 05/14 0700 In: 720 [P.O.:720] Out: 2250 [Urine:2250]    PHYSICAL EXAM:  Heart: RRR Lungs: Diminished BS in bases bilaterally, no wheezing or rhonchi Wound: Clean and dry Extremities: Mild RLE edema    Lab Results: CBC: Recent Labs  12/20/13 0349  WBC 5.9  HGB 10.3*  HCT 32.5*  PLT 169   BMET:  Recent Labs  12/20/13 0349  NA 141  K 3.7  CL 101  CO2 32  GLUCOSE 83  BUN 10  CREATININE 0.42*  CALCIUM 8.7    PT/INR: No results found for this basename: LABPROT, INR,  in the last 72 hours    Assessment/Plan: S/P Procedure(s) (LRB): CORONARY ARTERY BYPASS GRAFTING (CABG) (N/A) INTRAOPERATIVE TRANSESOPHAGEAL ECHOCARDIOGRAM (N/A) CV- maintaining SR, BPs continue to trend up.Continue Lopressor,Amiodarone, and will resume low dose ARB .  Vol overload/bilateral effusions - continue diuresis.  Pulm- D#4 Augmentin for presumed bronchitis. Continue pulm toilet.  Down to 1L O2, continue to wean as tolerated. CRPI/ambulation.  Disp- to SNF when bed available.   LOS: 7 days    Coolidge Breeze 12/22/2013

## 2013-12-22 NOTE — Progress Notes (Signed)
CSW noted consult that pt would like [facility]. Referral has been made. Full assessment to follow after facility responds and CSW meets with pt.   Ky Barban, MSW, [facility] Clinical Social Worker [phone]

## 2013-12-22 NOTE — Evaluation (Signed)
Occupational Therapy Evaluation Patient Details Name: Patricia [name] MRN: [id number] DOB: 21-Feb-1941 Today's Date: 12/22/2013    History of Present Illness Patient is a 73 y/o female now s/p CABG x3 due to CAD.  Also with h/o COPD, tobacco abuse, PAD, HTN.   Clinical Impression   Patient evaluated by Occupational Therapy with no further acute OT needs identified. All education has been completed and the patient has no further questions. See below for any follow-up Occupational Therapy or equipment needs. OT to sign off. Thank you for referral. Defer all further therapy to snf level      Follow Up Recommendations  SNF    Equipment Recommendations  Other (comment) (defer snf)    Recommendations for Other Services       Precautions / Restrictions Precautions Precautions: Sternal;Fall      Mobility Bed Mobility               General bed mobility comments: in chair - cardiac rehab exiting room  Transfers Overall transfer level: Needs assistance Equipment used: Rolling walker (2 wheeled) Transfers: Sit to/from Stand Sit to Stand: Min guard              Balance Overall balance assessment: Needs assistance         Standing balance support: During functional activity;No upper extremity supported Standing balance-Leahy Scale: Fair                              ADL Overall ADL's : Needs assistance/impaired Eating/Feeding: Modified independent;Sitting   Grooming: Oral care;Wash/dry face;Supervision/safety;Standing                   Toilet Transfer: Min guard;Ambulation;RW Toilet Transfer Details (indicate cue type and reason): cues for safe hand placement         Functional mobility during ADLs: Min guard;Rolling walker General ADL Comments: Pt educated on how to complete hair washing at SNF level because this topic was very important to the patient. Pt ambulated to sink and performed oral care for the first time. Pt educated on  sternal precautions and AROM shoulders 90 degrees or less. pt progressing well and good reecall of precautions     Vision                     Perception     Praxis      Pertinent Vitals/Pain VSS     Hand Dominance Right   Extremity/Trunk Assessment Upper Extremity Assessment Upper Extremity Assessment: Overall WFL for tasks assessed (not assessed past 90 degrees due to sternal precautions)   Lower Extremity Assessment Lower Extremity Assessment: Defer to PT evaluation   Cervical / Trunk Assessment Cervical / Trunk Assessment: Kyphotic   Communication Communication Communication: No difficulties   Cognition Arousal/Alertness: Awake/alert Behavior During Therapy: WFL for tasks assessed/performed Overall Cognitive Status: Within Functional Limits for tasks assessed                     General Comments       Exercises       Shoulder Instructions      Home Living Family/patient expects to be discharged to:: Skilled nursing facility Living Arrangements: Other relatives (grandson 18 yo )   Type of Home: House Home Access: Level entry     Home Layout: One level     Bathroom Shower/Tub: Teacher, early years/pre: Standard  Home Equipment: None          Prior Functioning/Environment Level of Independence: Independent             OT Diagnosis: Generalized weakness;Acute pain   OT Problem List: Decreased strength;Decreased activity tolerance;Impaired balance (sitting and/or standing);Decreased safety awareness;Decreased knowledge of use of DME or AE;Decreased knowledge of precautions;Pain   OT Treatment/Interventions: Self-care/ADL training;Therapeutic exercise;DME and/or AE instruction;Therapeutic activities;Patient/family education;Balance training    OT Goals(Current goals can be found in the care plan section) Acute Rehab OT Goals Patient Stated Goal: To go to rehab prior to going home OT Goal Formulation: With patient   OT Frequency: Min 2X/week   Barriers to D/C:            Co-evaluation              End of Session Nurse Communication: Mobility status;Precautions  Activity Tolerance: Patient tolerated treatment well Patient left: in chair;with call bell/phone within reach   Time: 1145-1201 ([id number]) OT Time Calculation (min): 16 min Charges:  OT General Charges $OT Visit: 1 Procedure OT Evaluation $Initial OT Evaluation Tier I: 1 Procedure OT Treatments $Self Care/Home Management : 8-22 mins G-Codes:    Peri Maris 12/24/2013, 12:10 PM Pager: [phone]

## 2013-12-22 NOTE — Progress Notes (Signed)
Clinical Social Work Department BRIEF PSYCHOSOCIAL ASSESSMENT 12/22/2013  Patient:  Patricia [name], Patricia [name]     Account Number:  [id number]     Admit date:  12/15/2013  Clinical Social Worker:  Freeman Caldron  Date/Time:  12/22/2013 03:23 PM  Referred by:  Physician  Date Referred:  12/22/2013 Referred for  SNF Placement   Other Referral:   Interview type:  Family Other interview type:    PSYCHOSOCIAL DATA Living Status:  FAMILY Admitted from facility:   Level of care:   Primary support name:  Patricia [name] [phone]) Primary support relationship to patient:  FAMILY Degree of support available:   Good--pt lives with 74 year-old grandson.    CURRENT CONCERNS Current Concerns  Post-Acute Placement   Other Concerns:    SOCIAL WORK ASSESSMENT / PLAN CSW notified pt needing SNF; CSW read PT note, and pt wants to go to [facility] as her sister is there. CSW made referral, and facility can take pt tomorrow. Pt will be ready for discharge tomorrow, so CSW informed facility and told grandson to arrange time to do admissions paperwork. CSW following for discharge to [facility] on 12/23/13. RN to inform pt of bed offer.   Assessment/plan status:  Psychosocial Support/Ongoing Assessment of Needs Other assessment/ plan:   Information/referral to community resources:   SNF - [facility]    PATIENT'S/FAMILY'S RESPONSE TO PLAN OF CARE: Good--pt's grandson understanding of CSW role in discharge.       Ky Barban, MSW, [facility] Clinical Social Worker [phone]

## 2013-12-23 MED ORDER — FUROSEMIDE 40 MG PO TABS
40.0000 mg | ORAL_TABLET | Freq: Every day | ORAL | Status: DC
  Administered 2013-12-24: 40 mg via ORAL
  Filled 2013-12-23 (×2): qty 1

## 2013-12-23 MED ORDER — BENZONATATE 100 MG PO CAPS: 100.0000 mg | ORAL_CAPSULE | Freq: Three times a day (TID) | ORAL | Status: DC | PRN

## 2013-12-23 MED ORDER — METOPROLOL TARTRATE 25 MG PO TABS: 25.0000 mg | ORAL_TABLET | Freq: Two times a day (BID) | ORAL | Status: DC

## 2013-12-23 MED ORDER — GUAIFENESIN ER 600 MG PO TB12: 600.0000 mg | ORAL_TABLET | Freq: Two times a day (BID) | ORAL | Status: DC

## 2013-12-23 MED ORDER — AMIODARONE HCL 200 MG PO TABS: 200.0000 mg | ORAL_TABLET | Freq: Two times a day (BID) | ORAL | Status: DC

## 2013-12-23 MED ORDER — AMOXICILLIN-POT CLAVULANATE 875-125 MG PO TABS: 1.0000 | ORAL_TABLET | Freq: Two times a day (BID) | ORAL | Status: DC

## 2013-12-23 MED ORDER — LOSARTAN POTASSIUM 25 MG PO TABS: 25.0000 mg | ORAL_TABLET | Freq: Every day | ORAL | Status: DC

## 2013-12-23 MED ORDER — TRAMADOL HCL 50 MG PO TABS: 50.0000 mg | ORAL_TABLET | Freq: Four times a day (QID) | ORAL | Status: DC | PRN

## 2013-12-23 MED ORDER — ASPIRIN 325 MG PO TBEC: 325.0000 mg | DELAYED_RELEASE_TABLET | Freq: Every day | ORAL | Status: DC

## 2013-12-23 MED ORDER — FUROSEMIDE 40 MG PO TABS: 40.0000 mg | ORAL_TABLET | Freq: Every day | ORAL | Status: DC

## 2013-12-23 MED ORDER — POTASSIUM CHLORIDE CRYS ER 20 MEQ PO TBCR
20.0000 meq | EXTENDED_RELEASE_TABLET | Freq: Every day | ORAL | Status: DC
  Administered 2013-12-24: 20 meq via ORAL
  Filled 2013-12-23 (×2): qty 1

## 2013-12-23 MED ORDER — POTASSIUM CHLORIDE CRYS ER 20 MEQ PO TBCR: 20.0000 meq | EXTENDED_RELEASE_TABLET | Freq: Every day | ORAL | Status: DC

## 2013-12-23 NOTE — Progress Notes (Signed)
CSW sent discharge summary to [place]. Plan is for discharge tomorrow morning. Will make weekend CSW aware.  Ky Barban, MSW, [facility] Clinical Social Worker [phone]

## 2013-12-23 NOTE — Progress Notes (Signed)
FL2 on pt's chart for MD signature. CSW following for discharge to [facility].   Ky Barban, MSW, [facility] Clinical Social Worker [phone]

## 2013-12-23 NOTE — Progress Notes (Signed)
CARDIAC REHAB PHASE I   PRE:  Rate/Rhythm: 66 SR  BP:  Supine:   Sitting: 106/46  Standing:    SaO2: 87 RA 91 1L  MODE:  Ambulation: 890 ft   POST:  Rate/Rhythm: 68  BP:  Supine:   Sitting: 120/50  Standing:    SaO2: 93 1L 1345-1440 On arrival pt on room air sat 87%, O2 reapplied 1L sat 91 %. Assisted X 1 used O2 1L and walker. Gait steady with walker. In hall O2 sat 90 1L. Pt without c/o with walking. To recliner after walk with call light in reach. Completed discharge education with pt. We discussed smoking cessation. I gave pt tips for quitting, quit smart class and coaching contact number. She seems motivated to quit. Pt agrees to [place]. CRP in [place], will send referral.  Rodney Langton RN 12/23/2013 2:52 PM

## 2013-12-23 NOTE — Progress Notes (Signed)
Physical Therapy Treatment Patient Details Name: Patricia [name] MRN: [id number] DOB: 01/09/1941 Today's Date: 12/23/2013    History of Present Illness Patient is a 73 y/o female now s/p CABG x3 due to CAD.  Also with h/o COPD, tobacco abuse, PAD, HTN.    PT Comments    Pt admitted with above. Pt currently with functional limitations due to endurance deficits with pt with desaturation of O2 with ambulation.  Pt will benefit from skilled PT to increase their independence and safety with mobility to allow discharge to the venue listed below.   Follow Up Recommendations  SNF     Equipment Recommendations  Rolling walker with 5" wheels    Recommendations for Other Services       Precautions / Restrictions Precautions Precautions: Sternal;Fall Restrictions Weight Bearing Restrictions: No    Mobility  Bed Mobility               General bed mobility comments: in chair  Transfers Overall transfer level: Needs assistance Equipment used: Rolling walker (2 wheeled) Transfers: Sit to/from Stand Sit to Stand: Supervision         General transfer comment: No cues for hand placement as pt had pillow and held onto it for sternal precautions.  Safe ascent and descent from chair.    Ambulation/Gait Ambulation/Gait assistance: Supervision Ambulation Distance (Feet): 500 Feet Assistive device: Rolling walker (2 wheeled) Gait Pattern/deviations: Step-through pattern;Decreased stride length;Trunk flexed   Gait velocity interpretation: Below normal speed for age/gender General Gait Details: Pt needed occasional cues to stay close to RW.  Pt needed cues for pursed lip breathing on RA as sats dropped to 87-91% on RA.  With pursed lip breathing, pt could get O2 to 90%.  May need Home O2 if desaturation persists.  Overall good walker safety.   Stairs            Wheelchair Mobility    Modified Rankin (Stroke Patients Only)       Balance Overall balance assessment: Needs  assistance         Standing balance support: Bilateral upper extremity supported;During functional activity Standing balance-Leahy Scale: Fair Standing balance comment: Relies on UEs for support for balance but does not put too much weight on UEs.                      Cognition Arousal/Alertness: Awake/alert Behavior During Therapy: WFL for tasks assessed/performed Overall Cognitive Status: Within Functional Limits for tasks assessed                      Exercises General Exercises - Lower Extremity Ankle Circles/Pumps: AROM;Both;10 reps;Seated Long Arc Quad: AROM;Both;10 reps;Seated Hip Flexion/Marching: AROM;Both;10 reps;Seated    General Comments        Pertinent Vitals/Pain Desats to 87-91% on RA with ambulation.  Other VSS, No pain    Home Living                      Prior Function            PT Goals (current goals can now be found in the care plan section) Progress towards PT goals: Progressing toward goals    Frequency  Min 3X/week    PT Plan Current plan remains appropriate    Co-evaluation             End of Session Equipment Utilized During Treatment: Gait belt;Oxygen Activity Tolerance: Patient tolerated treatment well Patient left:  in chair;with call bell/phone within reach     Time: 0945-1016 PT Time Calculation (min): 31 min  Charges:  $Gait Training: 8-22 mins $Therapeutic Exercise: 8-22 mins                    G Codes:      Christianne Dolin 2014/01/13, 10:54 AM [name] Acute Rehabilitation [phone] (pager)

## 2013-12-23 NOTE — Progress Notes (Signed)
8 Days Post-Op Procedure(s) (LRB): CORONARY ARTERY BYPASS GRAFTING (CABG) (N/A) INTRAOPERATIVE TRANSESOPHAGEAL ECHOCARDIOGRAM (N/A) Subjective: Feels ok,, some upper airway congestion.non-prod cough O2 being weaned off  Objective: Vital signs in last 24 hours: Temp:  [98 F (36.7 C)-98.1 F (36.7 C)] 98 F (36.7 C) (05/15 0508) Pulse Rate:  [60-67] 65 (05/15 1031) Cardiac Rhythm:  [-] Normal sinus rhythm (05/15 1031) Resp:  [18] 18 (05/15 0508) BP: (98-154)/(51-73) 108/65 mmHg (05/15 1031) SpO2:  [91 %-96 %] 96 % (05/15 1031) Weight:  [141 lb 14.4 oz (64.365 kg)] 141 lb 14.4 oz (64.365 kg) (05/15 0508) Rhythm- sinus rhythm/sinus brady Hemodynamic parameters for last 24 hours:    Intake/Output from previous day: 05/14 0701 - 05/15 0700 In: 480 [P.O.:480] Out: 200 [Urine:200] Intake/Output this shift: Total I/O In: 240 [P.O.:240] Out: -   General appearance: alert, cooperative and no distress Heart: regular rate and rhythm Lungs: dim in bases Abdomen: benign Extremities: minor edema Wound: incis healing well  Lab Results: No results found for this basename: WBC, HGB, HCT, PLT,  in the last 72 hours BMET: No results found for this basename: NA, K, CL, CO2, GLUCOSE, BUN, CREATININE, CALCIUM,  in the last 72 hours  PT/INR: No results found for this basename: LABPROT, INR,  in the last 72 hours ABG    Component Value Date/Time   PHART 7.332* 12/16/2013 0427   HCO3 25.3* 12/16/2013 0427   TCO2 28 12/16/2013 1709   ACIDBASEDEF 1.0 12/16/2013 0427   O2SAT 96.0 12/16/2013 0427   Scheduled Meds: . amiodarone  200 mg Oral BID  . amoxicillin-clavulanate  1 tablet Oral Q12H  . aspirin EC  325 mg Oral Daily  . budesonide-formoterol  2 puff Inhalation BID  . docusate sodium  200 mg Oral Daily  . furosemide  40 mg Oral BID  . guaiFENesin  600 mg Oral BID  . losartan  25 mg Oral Daily  . metoprolol tartrate  25 mg Oral BID  . pantoprazole  40 mg Oral QAC breakfast  . potassium  chloride  20 mEq Oral BID  . simvastatin  20 mg Oral q1800  . sodium chloride  3 mL Intravenous Q12H  . tiotropium  18 mcg Inhalation Daily   Continuous Infusions:  PRN Meds:.sodium chloride, benzonatate, bisacodyl, bisacodyl, clonazePAM, levalbuterol, ondansetron (ZOFRAN) IV, ondansetron, sodium chloride, traMADol CBG (last 3)  Assessment/Plan: S/P Procedure(s) (LRB): CORONARY ARTERY BYPASS GRAFTING (CABG) (N/A) INTRAOPERATIVE TRANSESOPHAGEAL ECHOCARDIOGRAM (N/A)  1 conts to progress nicely  2 weaning O2 , conts pulm toilet/rehab 3 reduce lasix/K+ to daily    LOS: 8 days    John Giovanni 12/23/2013

## 2013-12-24 ENCOUNTER — Inpatient Hospital Stay (HOSPITAL_COMMUNITY): Payer: Medicare Other

## 2013-12-24 NOTE — Progress Notes (Signed)
CARDIAC REHAB PHASE I   PRE:  Rate/Rhythm: 61 SR  BP:  Sitting: 116/62     SaO2: 95 2L  MODE:  Ambulation: 350 ft   POST:  Rate/Rhythm: 69 SR  BP:  Sitting: 130/60    SaO2: 92 2L  Pt walked 350 ft with RW and assist x1. Pt c/o leg tiredness/weakness during ambulation and mild SOB on 2L.  Pt sat remained stable 90-93% on 2L.  Reviewed IS use and encouraged to walk 2x more today.  [id number]  Lillia Dallas MS, ACSM RCEP 8:31 AM 12/24/2013

## 2013-12-24 NOTE — Progress Notes (Signed)
Per RN patient is not medically stable for D/C today and will likely be ready tomorrow. Clinical Education officer, museum (CSW) contacted [employer] and made them aware of above. CSW will continue to follow and assist as needed.   Blima Rich, [place] Weekend CSW [phone]

## 2013-12-24 NOTE — Progress Notes (Addendum)
      [address]       [place] [address]             [phone]      9 Days Post-Op Procedure(s) (LRB): CORONARY ARTERY BYPASS GRAFTING (CABG) (N/A) INTRAOPERATIVE TRANSESOPHAGEAL ECHOCARDIOGRAM (N/A) Subjective: Feeling better, cough improving but still hassome upper airway congestion  Objective: Vital signs in last 24 hours: Temp:  [97.6 F (36.4 C)-98.6 F (37 C)] 97.6 F (36.4 C) (05/16 0400) Pulse Rate:  [55-65] 55 (05/16 0400) Cardiac Rhythm:  [-] Normal sinus rhythm (05/15 1925) Resp:  [18-20] 19 (05/16 0400) BP: (108-165)/(50-65) 165/64 mmHg (05/16 0400) SpO2:  [92 %-96 %] 92 % (05/16 0400) FiO2 (%):  [93 %] 93 % (05/15 1437) Weight:  [141 lb 6.4 oz (64.139 kg)] 141 lb 6.4 oz (64.139 kg) (05/16 0400)  Hemodynamic parameters for last 24 hours:    Intake/Output from previous day: 05/15 0701 - 05/16 0700 In: 600 [P.O.:600] Out: 150 [Urine:150] Intake/Output this shift:    General appearance: alert, cooperative and no distress Heart: regular rate and rhythm Lungs: dim air exchange, decreased BS in bases Abdomen: benign exam Extremities: no sig edema Wound: incis healing well  Lab Results: No results found for this basename: WBC, HGB, HCT, PLT,  in the last 72 hours BMET: No results found for this basename: NA, K, CL, CO2, GLUCOSE, BUN, CREATININE, CALCIUM,  in the last 72 hours  PT/INR: No results found for this basename: LABPROT, INR,  in the last 72 hours ABG    Component Value Date/Time   PHART 7.332* 12/16/2013 0427   HCO3 25.3* 12/16/2013 0427   TCO2 28 12/16/2013 1709   ACIDBASEDEF 1.0 12/16/2013 0427   O2SAT 96.0 12/16/2013 0427   CBG (last 3)  No results found for this basename: GLUCAP,  in the last 72 hours Scheduled Meds: . amiodarone  200 mg Oral BID  . amoxicillin-clavulanate  1 tablet Oral Q12H  . aspirin EC  325 mg Oral Daily  . budesonide-formoterol  2 puff Inhalation BID  . docusate sodium  200 mg Oral Daily  .  furosemide  40 mg Oral Daily  . guaiFENesin  600 mg Oral BID  . losartan  25 mg Oral Daily  . metoprolol tartrate  25 mg Oral BID  . pantoprazole  40 mg Oral QAC breakfast  . potassium chloride  20 mEq Oral Daily  . simvastatin  20 mg Oral q1800  . sodium chloride  3 mL Intravenous Q12H  . tiotropium  18 mcg Inhalation Daily   Continuous Infusions:  PRN Meds:.sodium chloride, benzonatate, bisacodyl, bisacodyl, clonazePAM, levalbuterol, ondansetron (ZOFRAN) IV, ondansetron, sodium chloride, traMADol Assessment/Plan: S/P Procedure(s) (LRB): CORONARY ARTERY BYPASS GRAFTING (CABG) (N/A) INTRAOPERATIVE TRANSESOPHAGEAL ECHOCARDIOGRAM (N/A)  1 CXR- right effusion improved, left fairly stable, still desats with ambulation. Dr Servando Snare wants to keep in hospital today to try to wean O2. If unable, then will go to SNF with O2 2 cont gentle diuresis 3 some hypertension, but BP mostly well controlled    LOS: 9 days    John Giovanni 12/24/2013  Plan NHP in am on 02 I have seen and examined Patricia [name] and agree with the above assessment  and plan.  Grace Isaac MD [facility] [phone] Office [phone] 12/24/2013 11:40 AM

## 2013-12-25 DIAGNOSIS — J209 Acute bronchitis, unspecified: Secondary | ICD-10-CM | POA: Diagnosis not present

## 2013-12-25 DIAGNOSIS — F411 Generalized anxiety disorder: Secondary | ICD-10-CM | POA: Diagnosis not present

## 2013-12-25 DIAGNOSIS — E785 Hyperlipidemia, unspecified: Secondary | ICD-10-CM | POA: Diagnosis not present

## 2013-12-25 DIAGNOSIS — R269 Unspecified abnormalities of gait and mobility: Secondary | ICD-10-CM | POA: Diagnosis not present

## 2013-12-25 DIAGNOSIS — R609 Edema, unspecified: Secondary | ICD-10-CM | POA: Diagnosis not present

## 2013-12-25 DIAGNOSIS — M818 Other osteoporosis without current pathological fracture: Secondary | ICD-10-CM | POA: Diagnosis not present

## 2013-12-25 DIAGNOSIS — D62 Acute posthemorrhagic anemia: Secondary | ICD-10-CM | POA: Diagnosis not present

## 2013-12-25 DIAGNOSIS — I10 Essential (primary) hypertension: Secondary | ICD-10-CM | POA: Diagnosis not present

## 2013-12-25 DIAGNOSIS — Z951 Presence of aortocoronary bypass graft: Secondary | ICD-10-CM | POA: Diagnosis not present

## 2013-12-25 DIAGNOSIS — J449 Chronic obstructive pulmonary disease, unspecified: Secondary | ICD-10-CM | POA: Diagnosis not present

## 2013-12-25 DIAGNOSIS — I739 Peripheral vascular disease, unspecified: Secondary | ICD-10-CM | POA: Diagnosis not present

## 2013-12-25 DIAGNOSIS — M6281 Muscle weakness (generalized): Secondary | ICD-10-CM | POA: Diagnosis not present

## 2013-12-25 DIAGNOSIS — I25810 Atherosclerosis of coronary artery bypass graft(s) without angina pectoris: Secondary | ICD-10-CM | POA: Diagnosis not present

## 2013-12-25 DIAGNOSIS — M899 Disorder of bone, unspecified: Secondary | ICD-10-CM | POA: Diagnosis not present

## 2013-12-25 DIAGNOSIS — I426 Alcoholic cardiomyopathy: Secondary | ICD-10-CM | POA: Diagnosis not present

## 2013-12-25 DIAGNOSIS — I4891 Unspecified atrial fibrillation: Secondary | ICD-10-CM | POA: Diagnosis not present

## 2013-12-25 DIAGNOSIS — I2510 Atherosclerotic heart disease of native coronary artery without angina pectoris: Secondary | ICD-10-CM | POA: Diagnosis not present

## 2013-12-25 DIAGNOSIS — J40 Bronchitis, not specified as acute or chronic: Secondary | ICD-10-CM | POA: Diagnosis not present

## 2013-12-25 MED ORDER — METOPROLOL TARTRATE 12.5 MG HALF TABLET
12.5000 mg | ORAL_TABLET | Freq: Two times a day (BID) | ORAL | Status: DC
  Filled 2013-12-25 (×2): qty 1

## 2013-12-25 MED ORDER — METOPROLOL TARTRATE 12.5 MG HALF TABLET: 12.5000 mg | ORAL_TABLET | Freq: Two times a day (BID) | ORAL | Status: DC

## 2013-12-25 MED ORDER — CLONAZEPAM 0.5 MG PO TABS: 0.5000 mg | ORAL_TABLET | Freq: Two times a day (BID) | ORAL | Status: DC | PRN

## 2013-12-25 MED ORDER — TRAMADOL HCL 50 MG PO TABS: 50.0000 mg | ORAL_TABLET | Freq: Four times a day (QID) | ORAL | Status: DC | PRN

## 2013-12-25 NOTE — Progress Notes (Signed)
IV and tele monitor d/c at this time; pt to d/c to [facility] via ambulance transport due to need for O2; will cont. To monitor.

## 2013-12-25 NOTE — Progress Notes (Signed)
[address]       [place] [address]             [phone]      10 Days Post-Op Procedure(s) (LRB): CORONARY ARTERY BYPASS GRAFTING (CABG) (N/A) INTRAOPERATIVE TRANSESOPHAGEAL ECHOCARDIOGRAM (N/A) Subjective: Feels ok, conts to require supplemental O2. She is bradycardic at times  Objective: Vital signs in last 24 hours: Temp:  [98 F (36.7 C)-98.6 F (37 C)] 98.5 F (36.9 C) (05/17 0449) Pulse Rate:  [53-71] 53 (05/17 0449) Cardiac Rhythm:  [-] Normal sinus rhythm (05/16 2021) Resp:  [18-19] 18 (05/17 0449) BP: (106-147)/(30-45) 147/45 mmHg (05/17 0449) SpO2:  [86 %-97 %] 94 % (05/17 0449) Weight:  [141 lb 8 oz (64.184 kg)] 141 lb 8 oz (64.184 kg) (05/17 0449)  Hemodynamic parameters for last 24 hours:    Intake/Output from previous day: 05/16 0701 - 05/17 0700 In: 360 [P.O.:360] Out: -  Intake/Output this shift:    General appearance: alert, cooperative and no distress Heart: regular rate and rhythm Lungs: dim in bases Abdomen: benign Extremities: minor edema Wound: incis healing well  Lab Results: No results found for this basename: WBC, HGB, HCT, PLT,  in the last 72 hours BMET: No results found for this basename: NA, K, CL, CO2, GLUCOSE, BUN, CREATININE, CALCIUM,  in the last 72 hours  PT/INR: No results found for this basename: LABPROT, INR,  in the last 72 hours ABG    Component Value Date/Time   PHART 7.332* 12/16/2013 0427   HCO3 25.3* 12/16/2013 0427   TCO2 28 12/16/2013 1709   ACIDBASEDEF 1.0 12/16/2013 0427   O2SAT 96.0 12/16/2013 0427   CBG (last 3)  No results found for this basename: GLUCAP,  in the last 72 hours Scheduled Meds: . amiodarone  200 mg Oral BID  . amoxicillin-clavulanate  1 tablet Oral Q12H  . aspirin EC  325 mg Oral Daily  . budesonide-formoterol  2 puff Inhalation BID  . docusate sodium  200 mg Oral Daily  . furosemide  40 mg Oral Daily  . guaiFENesin  600 mg Oral BID  . losartan  25 mg Oral Daily  .  metoprolol tartrate  25 mg Oral BID  . pantoprazole  40 mg Oral QAC breakfast  . potassium chloride  20 mEq Oral Daily  . simvastatin  20 mg Oral q1800  . sodium chloride  3 mL Intravenous Q12H  . tiotropium  18 mcg Inhalation Daily   Continuous Infusions:  PRN Meds:.sodium chloride, benzonatate, bisacodyl, bisacodyl, clonazePAM, levalbuterol, ondansetron (ZOFRAN) IV, ondansetron, sodium chloride, traMADol  Dg Chest 2 View  12/24/2013   CLINICAL DATA:  Status post CABG.  Pleural effusions  EXAM: CHEST  2 VIEW  COMPARISON:  12/20/2013  FINDINGS: Stable heart size and mediastinal contour status post CABG. No edema or consolidation. Small bilateral pleural effusions. The fluid on the right has decreased since prior. The fluid on the left appears mildly larger. No pneumothorax.  IMPRESSION: Small bilateral pleural effusions with mild interval decrease on the right and increase on the left.   Electronically Signed   By: Jorje Guild M.D.   On: 12/24/2013 08:35   Assessment/Plan: S/P Procedure(s) (LRB): CORONARY ARTERY BYPASS GRAFTING (CABG) (N/A) INTRAOPERATIVE TRANSESOPHAGEAL ECHOCARDIOGRAM (N/A)   1 stable for transfer to SNF with 1 liter O2- continuous 2 will decrease beta blocker dose 3 may need further adjustment of HTN meds in future but will leave current meds as they are  as BP is fairly variable  LOS: 10 days    Patricia [name] 12/25/2013

## 2013-12-25 NOTE — Progress Notes (Signed)
Clinical Social Work Department CLINICAL SOCIAL WORK PLACEMENT NOTE 12/25/2013  Patient:  Patricia [name], Patricia [name]  Account Number:  [id number] [place] date:  12/15/2013  Clinical Social Worker:  Blima Rich, Latanya Presser  Date/time:  12/25/2013 09:07 AM  Clinical Social Work is seeking post-discharge placement for this patient at the following level of care:   [place]   (*CSW will update this form in Epic as items are completed)   12/22/2013  Patient/family provided with [place] Department of Clinical Social Work's list of facilities offering this level of care within the geographic area requested by the patient (or if unable, by the patient's family).  12/22/2013  Patient/family informed of their freedom to choose among providers that offer the needed level of care, that participate in Medicare, Medicaid or managed care program needed by the patient, have an available bed and are willing to accept the patient.  12/22/2013  Patient/family informed of [facility]' ownership interest in [facility] as well as of the fact that they are under no obligation to receive care at this facility.  PASARR submitted to EDS on  PASARR number received from [place] on   FL2 transmitted to all facilities in geographic area requested by pt/family on  12/22/2013 FL2 transmitted to all facilities within larger geographic area on   Patient informed that his/her managed care company has contracts with or will negotiate with  certain facilities, including the following:     Patient/family informed of bed offers received:   Patient chooses bed at [facility] Physician recommends and patient chooses bed at    Patient to be transferred to [facility] on  12/25/2013 Patient to be transferred to facility by EMS  The following physician request were entered in Epic:   Additional Comments: Patient has an [place].

## 2013-12-25 NOTE — Progress Notes (Signed)
Chest tube sutures d/c at this time per order; steri strips applied to site; will cont. To monitor.

## 2013-12-25 NOTE — Progress Notes (Signed)
Patient is medically stable to D/C to [facility] today. Per Neoma Laming weekend supervisor at [facility] patient is going to room [address] on [address]. Clinical Education officer, museum (CSW) prepared D/C packet and arranged EMS for transport because patient is on oxygen. RN and MD are aware of above. Please reconsult if further social work needs arise. CSW signing off.   Blima Rich, [place] Weekend CSW [phone]

## 2013-12-26 ENCOUNTER — Encounter: Payer: Self-pay | Admitting: Adult Health

## 2013-12-26 ENCOUNTER — Non-Acute Institutional Stay (SKILLED_NURSING_FACILITY): Payer: Medicare Other | Admitting: Adult Health

## 2013-12-26 ENCOUNTER — Encounter: Payer: Self-pay | Admitting: *Deleted

## 2013-12-26 DIAGNOSIS — F411 Generalized anxiety disorder: Secondary | ICD-10-CM

## 2013-12-26 DIAGNOSIS — D62 Acute posthemorrhagic anemia: Secondary | ICD-10-CM

## 2013-12-26 DIAGNOSIS — F419 Anxiety disorder, unspecified: Secondary | ICD-10-CM

## 2013-12-26 DIAGNOSIS — I4891 Unspecified atrial fibrillation: Secondary | ICD-10-CM | POA: Insufficient documentation

## 2013-12-26 DIAGNOSIS — I10 Essential (primary) hypertension: Secondary | ICD-10-CM

## 2013-12-26 DIAGNOSIS — E8770 Fluid overload, unspecified: Secondary | ICD-10-CM | POA: Insufficient documentation

## 2013-12-26 DIAGNOSIS — J449 Chronic obstructive pulmonary disease, unspecified: Secondary | ICD-10-CM

## 2013-12-26 DIAGNOSIS — J40 Bronchitis, not specified as acute or chronic: Secondary | ICD-10-CM | POA: Insufficient documentation

## 2013-12-26 DIAGNOSIS — Z951 Presence of aortocoronary bypass graft: Secondary | ICD-10-CM

## 2013-12-26 DIAGNOSIS — E785 Hyperlipidemia, unspecified: Secondary | ICD-10-CM

## 2013-12-26 DIAGNOSIS — E8779 Other fluid overload: Secondary | ICD-10-CM

## 2013-12-26 DIAGNOSIS — I6529 Occlusion and stenosis of unspecified carotid artery: Secondary | ICD-10-CM

## 2013-12-26 NOTE — Progress Notes (Signed)
Patient ID: Cherre Kothari, female   DOB: [birth year], 73 y.o.   MRN: [id number]               PROGRESS NOTE  DATE: 12/26/2013  FACILITY: Nursing Home Location: [facility]  LEVEL OF CARE: SNF (31)  Acute Visit  CHIEF COMPLAINT:  Followl-up Hospitalization  HISTORY OF PRESENT ILLNESS: This is a 73 year old female who has been admitted to [facility] on 12/24/13 from [facility] with severe 3-vessel coronary artery disease S/P CABG. She has been admitted for a short-term rehabilitation.  REASSESSMENT OF ONGOING PROBLEM(S):  HTN: Pt 's HTN remains stable.  Denies CP, sob, DOE, pedal edema, headaches, dizziness or visual disturbances.  No complications from the medications currently being used.  Last BP : 150/58  COPD: the COPD remains stable.  Pt denies sob, cough, wheezing or declining exercise tolerance.  No complications from the medications presently being used.  ANEMIA: The anemia has been stable. The patient denies fatigue, melena or hematochezia. No complications from the medications currently being used. 5/15 hgb 10.3   PAST MEDICAL HISTORY : Reviewed.  No changes/see problem list  CURRENT MEDICATIONS: Reviewed per MAR/see medication list  REVIEW OF SYSTEMS:  GENERAL: no change in appetite, no fatigue, no weight changes, no fever, chills or weakness RESPIRATORY: no cough, SOB, DOE, wheezing, hemoptysis CARDIAC: no chest pain, edema or palpitations GI: no abdominal pain, diarrhea, constipation, heart burn, nausea or vomiting  PHYSICAL EXAMINATION  GENERAL: no acute distress, normal body habitus SKIN: chest and abdominal and LLE surgical incision is dry, no redness EYES: conjunctivae normal, sclerae normal, normal eye lids NECK: supple, trachea midline, no neck masses, no thyroid tenderness, no thyromegaly LYMPHATICS: no LAN in the neck, no supraclavicular LAN RESPIRATORY: breathing is even & unlabored, BS CTAB CARDIAC: RRR, no murmur,no extra  heart sounds, no edema GI: abdomen soft, normal BS, no masses, no tenderness, no hepatomegaly, no splenomegaly EXTREMITIES:  Able to move all 4 extremities PSYCHIATRIC: the patient is alert & oriented to person, affect & behavior appropriate  LABS/RADIOLOGY: Labs reviewed: Basic Metabolic Panel:  Recent Labs  12/15/13 1935 12/16/13 0435 12/16/13 1700  12/18/13 0401 12/19/13 0230 12/20/13 0349  NA 144 145  --   < > 139 140 141  K 4.3 4.3  --   < > 4.6 3.9 3.7  CL 107 111  --   < > 103 100 101  CO2  --  23  --   < > 27 30 32  GLUCOSE 124* 142*  --   < > 114* 105* 83  BUN 5* 7  --   < > 13 12 10   CREATININE 0.47*  0.50 0.43* 0.51  < > 0.57 0.48* 0.42*  CALCIUM  --  7.9*  --   < > 8.2* 8.4 8.7  MG 3.2* 2.5 2.4  --   --   --   --   < > = values in this interval not displayed. Liver Function Tests:  Recent Labs  07/11/13 1543 12/13/13 1606  AST 17 18  ALT 18 13  ALKPHOS  --  134*  BILITOT  --  <0.2*  PROT  --  7.0  ALBUMIN  --  3.5   CBC:  Recent Labs  07/11/13 1543  12/17/13 0330 12/18/13 0401 12/20/13 0349  WBC 8.9  < > 8.6 8.8 5.9  NEUTROABS 5.8  --   --   --   --   HGB 14.6  < >  10.0* 9.7* 10.3*  HCT 42.7  < > 30.8* 30.7* 32.5*  MCV 90.2  < > 94.2 94.8 94.2  PLT 221.0  < > 126* 139* 169  < > = values in this interval not displayed.  CBG:  Recent Labs  12/17/13 0340 12/17/13 1152 12/17/13 1543  GLUCAP 109* 118* 112*   CLINICAL DATA:  Status post CABG.  Pleural effusions   EXAM: CHEST  2 VIEW   COMPARISON:  12/20/2013   FINDINGS: Stable heart size and mediastinal contour status post CABG. No edema or consolidation. Small bilateral pleural effusions. The fluid on the right has decreased since prior. The fluid on the left appears mildly larger. No pneumothorax.   IMPRESSION: Small bilateral pleural effusions with mild interval decrease on the right and increase on the left.     ASSESSMENT/PLAN:  Severe 3-Vessel Coronary Artery Disease  S/P CABG - for rehabilitation Atrial Fibrillation - rate controlled; continue Paceron and Lopressor Anemia, acute blood loss - stable; check CBC Bilateral pleural effusions/fluid overload - continue Lasix; check BMP Hypertension - continue Cozaar and Lopressor Hyperlipidemia - continue Zocor COPD - well controlled; continue Spiriva Anxiety - stable; continue Klonopin when necessary CAD - stable; continue aspirin Bronchitis - continue Augmentin and Mucinex   CPT CODE: 82800  Monina Vargas - NP [facility] [phone]

## 2013-12-27 ENCOUNTER — Non-Acute Institutional Stay (SKILLED_NURSING_FACILITY): Payer: Medicare Other | Admitting: Internal Medicine

## 2013-12-27 DIAGNOSIS — I10 Essential (primary) hypertension: Secondary | ICD-10-CM | POA: Diagnosis not present

## 2013-12-27 DIAGNOSIS — I2510 Atherosclerotic heart disease of native coronary artery without angina pectoris: Secondary | ICD-10-CM

## 2013-12-27 DIAGNOSIS — D62 Acute posthemorrhagic anemia: Secondary | ICD-10-CM | POA: Diagnosis not present

## 2013-12-27 DIAGNOSIS — J449 Chronic obstructive pulmonary disease, unspecified: Secondary | ICD-10-CM | POA: Diagnosis not present

## 2013-12-27 NOTE — Progress Notes (Signed)
HISTORY & PHYSICAL  DATE: 12/27/2013   FACILITY: [facility]  LEVEL OF CARE: SNF (31)  ALLERGIES:  Allergies  Allergen Reactions  . Ace Inhibitors     cough    CHIEF COMPLAINT:  Manage CAD, COPD and hypertension  HISTORY OF PRESENT ILLNESS: 73 year old Caucasian female is admitted to this facility for short-term rehabilitation after CABG.  CAD: The angina has been stable. The patient denies dyspnea on exertion, orthopnea, pedal edema, palpitations and paroxysmal nocturnal dyspnea. No complications noted from the medication presently being used. Patient was diagnosed with severe three-vessel CAD and underwent CABG x3.  COPD: the COPD remains stable.  Pt denies sob, cough, wheezing or declining exercise tolerance.  No complications from the medications presently being used.  HTN: Pt 's HTN remains stable.  Denies CP, sob, DOE, pedal edema, headaches, dizziness or visual disturbances.  No complications from the medications currently being used.  Last BP : 139/59.  PAST MEDICAL HISTORY :  Past Medical History  Diagnosis Date  . Osteopenia   . COPD (chronic obstructive pulmonary disease)   . Hyperlipidemia   . Thyroid nodule   . Abnormal LFTs   . Depression   . HTN (hypertension)     intolerant to ACEi (cough)  . Allergic rhinitis   . Anxiety   . Panic attacks   . PAD (peripheral artery disease)     Carotid dz as  below, and at [facility] 11/12:  critical stenosis noted just below the sheath site leading into a totally occluded SFA and a patent deep femoral artery  . Carotid artery disease     Moderate - f/u dopplers needed 11/2012  . CAD (coronary artery disease)     s/p DES to the circumflex 12/11;  06/2011: complex PCI (POBA) of the CFX/OM bifurcation with IVUS guidance; [place] s/p PTCA/cutting balloon to LCx 06/2012 (chronic subtotally occ RCA with L-R collaterals, residual nonobst LAD dz)   . 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 Shortness of breath    exertional    PAST SURGICAL HISTORY: Past Surgical History  Procedure Laterality Date  . Coronary angioplasty with stent placement      "1; makes total of 3" (07/05/2012)  . Vaginal hysterectomy  ~ 1976    no oophorectomy  . 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 Coronary artery bypass graft N/A 12/15/2013    Procedure: CORONARY ARTERY BYPASS GRAFTING (CABG);  Surgeon: Gaye Pollack, MD;  Location: [place];  Service: Open Heart Surgery;  Laterality: N/A;  Times 3 using left internal mammary artery and endoscopically harvested right saphenous vein   . Intraoperative transesophageal echocardiogram N/A 12/15/2013    Procedure: INTRAOPERATIVE TRANSESOPHAGEAL ECHOCARDIOGRAM;  Surgeon: Gaye Pollack, MD;  Location: [facility] OR;  Service: Open Heart Surgery;  Laterality: N/A;    SOCIAL HISTORY:  reports that she has been smoking Cigarettes.  She has a 27 pack-year smoking history. She has never used smokeless tobacco. She reports that she drinks about 3.6 ounces of alcohol per week. She reports that she does not use illicit drugs.  FAMILY HISTORY:  Family History  Problem Relation Age of Onset  . Coronary artery disease Father   . Heart attack Father   . Hypertension Mother   . Stroke Neg Hx   . Colon cancer  Neg Hx   . Breast cancer Neg Hx   . Heart attack Brother     CURRENT MEDICATIONS: Reviewed per MAR/see medication list  REVIEW OF SYSTEMS:  See HPI otherwise 14 point ROS is negative.  PHYSICAL EXAMINATION  VS:  See VS section  GENERAL: no acute distress, normal body habitus EYES: conjunctivae normal, sclerae normal, normal eye lids MOUTH/THROAT: lips without lesions,no lesions in the mouth,tongue is without lesions,uvula elevates in midline NECK: supple, trachea midline, no neck masses, no thyroid tenderness, no thyromegaly LYMPHATICS: no LAN in the neck, no supraclavicular  LAN RESPIRATORY: breathing is even & unlabored, BS CTAB CARDIAC: RRR, no murmur,no extra heart sounds, no edema GI:  ABDOMEN: abdomen soft, normal BS, no masses, no tenderness  LIVER/SPLEEN: no hepatomegaly, no splenomegaly MUSCULOSKELETAL: HEAD: normal to inspection & palpation BACK: no kyphosis, scoliosis or spinal processes tenderness EXTREMITIES: LEFT UPPER EXTREMITY: full range of motion, normal strength & tone RIGHT UPPER EXTREMITY:  full range of motion, normal strength & tone LEFT LOWER EXTREMITY:  full range of motion, normal strength & tone RIGHT LOWER EXTREMITY:  full range of motion, normal strength & tone PSYCHIATRIC: the patient is alert & oriented to person, affect & behavior appropriate  LABS/RADIOLOGY:  Labs reviewed: Basic Metabolic Panel:  Recent Labs  12/15/13 1935 12/16/13 0435 12/16/13 1700  12/18/13 0401 12/19/13 0230 12/20/13 0349  NA 144 145  --   < > 139 140 141  K 4.3 4.3  --   < > 4.6 3.9 3.7  CL 107 111  --   < > 103 100 101  CO2  --  23  --   < > 27 30 32  GLUCOSE 124* 142*  --   < > 114* 105* 83  BUN 5* 7  --   < > 13 12 10   CREATININE 0.47*  0.50 0.43* 0.51  < > 0.57 0.48* 0.42*  CALCIUM  --  7.9*  --   < > 8.2* 8.4 8.7  MG 3.2* 2.5 2.4  --   --   --   --   < > = values in this interval not displayed. Liver Function Tests:  Recent Labs  07/11/13 1543 12/13/13 1606  AST 17 18  ALT 18 13  ALKPHOS  --  134*  BILITOT  --  <0.2*  PROT  --  7.0  ALBUMIN  --  3.5   CBC:  Recent Labs  07/11/13 1543  12/17/13 0330 12/18/13 0401 12/20/13 0349  WBC 8.9  < > 8.6 8.8 5.9  NEUTROABS 5.8  --   --   --   --   HGB 14.6  < > 10.0* 9.7* 10.3*  HCT 42.7  < > 30.8* 30.7* 32.5*  MCV 90.2  < > 94.2 94.8 94.2  PLT 221.0  < > 126* 139* 169  < > = values in this interval not displayed.  CBG:  Recent Labs  12/17/13 0340 12/17/13 1152 12/17/13 1543  GLUCAP 109* 118* 112*    Preoperative Vascular Evaluation  Patient:    Tzirel, Leonor MR #:       [id number] Study Date: 12/13/2013 Gender:     F Age:        64 Height: Weight: BSA: Pt. Status: Room:    ATTENDING    Abran Cantor, [place], [place], [place], [place]  Reports also to:  ------------------------------------------------------------ History and indications:  Indications  V72.81 Screening Pre-operative.  414.01 CAD.  440.21 Atherosclerosis of native arteries of the extremities with intermittent claudication.  History  Pre-op evaluation  ------------------------------------------------------------ Study information:  Study status:  Routine.  Procedure:  A vascular evaluation was performed. Image quality was adequate.    Preoperative vascular evaluation for heart surgery. Carotid duplex exam per standard extablished protocols, limited upper extremity arterial evaluation and ABIs as indicated.  Location: Vascular laboratory.  Patient status:  Outpatient.  Brachial pressures:  +--------+-----+----+---+         RightLeftMax +--------+-----+----+---+ Systolic125  [id number] +--------+-----+----+---+ Arterial pressure indices:  +-----------------+--------+--------------+----------+ Location         PressureBrachial indexWaveform   +-----------------+--------+--------------+----------+ Right dorsal ped 67mm Hg 0.57          Monophasic +-----------------+--------+--------------+----------+ Right post tibial72mm Hg 0.44          Monophasic +-----------------+--------+--------------+----------+ Left dorsal ped  15mm Hg0.84          Biphasic   +-----------------+--------+--------------+----------+ Left post tibial 167mm Hg0.85          Biphasic   +-----------------+--------+--------------+----------+ Arterial flow:  +------------+--------+-------+-----------+----------------+ Location    V sys   V ed   Flow       Comment                                      analysis                    +------------+--------+-------+-----------+----------------+ Right CCA - 77cm/s  14cm/s -----------Mild             proximal                              heterogeneous                                          plaque           +------------+--------+-------+-----------+----------------+ Right CCA - -70cm/s -11cm/s-----------Moderate diffuse distal                                heterogeneous                                          plaque           +------------+--------+-------+-----------+----------------+ Right       -129cm/s-25cm/s-----------Moderate         carotid bulb                          heterogeneous                                          plaque           +------------+--------+-------+-----------+----------------+ Right ECA   -267cm/s-15cm/s-----------Heterogeneous  plaque           +------------+--------+-------+-----------+----------------+ Right ICA - -124cm/s-28cm/s-----------Mild             proximal                              heterogeneous                                          plaque           +------------+--------+-------+-----------+----------------+ Right ICA - 114cm/s 20cm/s -----------Mild             mid                                   heterogeneous                                          plaque           +------------+--------+-------+-----------+----------------+ Right ICA - -101cm/s-27cm/s-----------Tortuous         distal                                                 +------------+--------+-------+-----------+----------------+ Right       44cm/s  6cm/s  Antegrade  ---------------- vertebral                  flow                        +------------+--------+-------+-----------+----------------+ Left CCA -   75cm/s  11cm/s -----------Mild intimal     proximal                              wall changes     +------------+--------+-------+-----------+----------------+ Left CCA -  -83cm/s -12cm/s-----------Moderate         mid                                   heterogeneous                                          plaque with                                            acoustic                                               shadowing        +------------+--------+-------+-----------+----------------+ Left CCA -  -139cm/s-19cm/s-----------Moderate         distal  heterogeneous                                          plaque           +------------+--------+-------+-----------+----------------+ Left carotid-192cm/s-41cm/s-----------Moderate         bulb                                  heterogeneous                                          plaque with                                            acoustic                                               shadowing        +------------+--------+-------+-----------+----------------+ Left ECA    126cm/s 9cm/s  -----------Mild                                                   heterogneoeus                                          plaque           +------------+--------+-------+-----------+----------------+ Left ICA -  130cm/s 18cm/s -----------Mild to moderate proximal                              heterogeneous                                          plaque           +------------+--------+-------+-----------+----------------+ Left ICA -  -70cm/s -16cm/s-----------Mild             mid                                   heterogenous                                           plaque           +------------+--------+-------+-----------+----------------+ Left ICA -  -81cm/s  -18cm/s-----------Tortuous         distal                                                 +------------+--------+-------+-----------+----------------+  Left        -53cm/s -14cm/sAntegrade  ---------------- vertebral                  flow                        +------------+--------+-------+-----------+----------------+ Right       --------------------------1.77             ICA/CCA                                                ratio                                                  +------------+--------+-------+-----------+----------------+ Left ICA/CCA--------------------------1.39             ratio                                                  +------------+--------+-------+-----------+----------------+ Right       125cm/s -------Triphasic  ---------------- brachial                   waveform                    +------------+--------+-------+-----------+----------------+ Right radial---------------Triphasic  ----------------                            waveform                    +------------+--------+-------+-----------+----------------+ Right ulnar ---------------Triphasic  ----------------                            waveform                    +------------+--------+-------+-----------+----------------+ Right palmar--------------------------Normal           arch                                                   +------------+--------+-------+-----------+----------------+ Left        131cm/s -------Triphasic  ---------------- brachial                   waveform                    +------------+--------+-------+-----------+----------------+ Left radial ---------------Triphasic  ----------------                            waveform                    +------------+--------+-------+-----------+----------------+ Left ulnar  ---------------Triphasic   ----------------                            waveform                    +------------+--------+-------+-----------+----------------+  Left palmar --------------------------Normal           arch                                                   +------------+--------+-------+-----------+----------------+  ------------------------------------------------------------ Summary:  - Right - 1% to 39% iCA stenosis however acoustic shadowing   may obscure higher velocities. Vertebral artery flow is   antegrade. - Left - 40% to 59% ICA stenosis. Vertebral arery flow is   antegrade. - Palmar arch evaluation - Doppler waveforms remained normal   bilaterally with both radial and ulnar compressions. - ABIs indicate a moderate reduction in arterila flow on the   right and a mid reduction in arterial flow on the left Prepared and Electronically Authenticated by  CHEST  2 VIEW   COMPARISON:  12/20/2013   FINDINGS: Stable heart size and mediastinal contour status post CABG. No edema or consolidation. Small bilateral pleural effusions. The fluid on the right has decreased since prior. The fluid on the left appears mildly larger. No pneumothorax.   IMPRESSION: Small bilateral pleural effusions with mild interval decrease on the right and increase on the left.  ASSESSMENT/PLAN:  CAD-status post CABG. Continue rehabilitation. COPD-well compensated. Hypertension-well controlled Acute blood loss anemia-recheck hemoglobin level Hypokalemia-continue supplementation. Check potassium level Check CBC and BMP  I have reviewed patient's medical records received at admission/from hospitalization.  CPT CODE: 01027  Gayani Y Dasanayaka, [place] [phone]

## 2014-01-03 ENCOUNTER — Encounter: Payer: Self-pay | Admitting: Adult Health

## 2014-01-03 ENCOUNTER — Non-Acute Institutional Stay (SKILLED_NURSING_FACILITY): Payer: Medicare Other | Admitting: Adult Health

## 2014-01-03 DIAGNOSIS — E785 Hyperlipidemia, unspecified: Secondary | ICD-10-CM

## 2014-01-03 DIAGNOSIS — I10 Essential (primary) hypertension: Secondary | ICD-10-CM

## 2014-01-03 DIAGNOSIS — Z951 Presence of aortocoronary bypass graft: Secondary | ICD-10-CM | POA: Diagnosis not present

## 2014-01-03 DIAGNOSIS — I4891 Unspecified atrial fibrillation: Secondary | ICD-10-CM

## 2014-01-03 DIAGNOSIS — F411 Generalized anxiety disorder: Secondary | ICD-10-CM

## 2014-01-03 DIAGNOSIS — D62 Acute posthemorrhagic anemia: Secondary | ICD-10-CM

## 2014-01-03 DIAGNOSIS — J449 Chronic obstructive pulmonary disease, unspecified: Secondary | ICD-10-CM

## 2014-01-03 DIAGNOSIS — F419 Anxiety disorder, unspecified: Secondary | ICD-10-CM

## 2014-01-03 NOTE — Progress Notes (Signed)
Patient ID: Patricia [name], female   DOB: Dec 06, 1940, 73 y.o.   MRN: [id number]               PROGRESS NOTE  DATE: 01/03/14  FACILITY: Nursing Home Location: [facility]  LEVEL OF CARE: SNF (31)  Acute Visit  CHIEF COMPLAINT:  Discharge Notes  HISTORY OF PRESENT ILLNESS: This is a 73 year old female who is for discharge home with Home health PT, OT and Nursing. DME: O2 @ 2L/min via [place] continuously. She has been admitted to [facility] on 12/24/13 from [facility] with severe 3-vessel coronary artery disease S/P CABG X 3.Patient was admitted to this facility for short-term rehabilitation after the patient's recent hospitalization.  Patient has completed SNF rehabilitation and therapy has cleared the patient for discharge.    REASSESSMENT OF ONGOING PROBLEM(S):  HTN: Pt 's HTN remains stable.  Denies CP, sob, DOE, pedal edema, headaches, dizziness or visual disturbances.  No complications from the medications currently being used.  Last BP : 141/53  ATRIAL FIBRILLATION: the patients atrial fibrillation remains stable.  The patient denies DOE, tachycardia, orthopnea, transient neurological sx, pedal edema, palpitations, & PNDs.  No complications noted from the medications currently being used.  ANXIETY: The anxiety remains stable. Patient denies ongoing anxiety or irritability. No complications reported from the medications currently being used.  PAST MEDICAL HISTORY : Reviewed.  No changes/see problem list  CURRENT MEDICATIONS: Reviewed per MAR/see medication list  REVIEW OF SYSTEMS:  GENERAL: no change in appetite, no fatigue, no weight changes, no fever, chills or weakness RESPIRATORY: no cough, SOB, DOE, wheezing, hemoptysis CARDIAC: no chest pain, edema or palpitations GI: no abdominal pain, diarrhea, constipation, heart burn, nausea or vomiting  PHYSICAL EXAMINATION  GENERAL: no acute distress, normal body habitus SKIN: chest and abdominal and LLE  surgical incision is dry, no redness NECK: supple, trachea midline, no neck masses, no thyroid tenderness, no thyromegaly LYMPHATICS: no LAN in the neck, no supraclavicular LAN RESPIRATORY: breathing is even & unlabored, BS CTAB CARDIAC: RRR, no murmur,no extra heart sounds, no edema GI: abdomen soft, normal BS, no masses, no tenderness, no hepatomegaly, no splenomegaly EXTREMITIES:  Able to move all 4 extremities PSYCHIATRIC: the patient is alert & oriented to person, affect & behavior appropriate  LABS/RADIOLOGY: 12/27/13  WBC 7.5 hemoglobin 10.4 hematocrit 33.9 sodium 136 potassium 4.2 glucose 93 BUN 10 creatinine 0.6 calcium 8.7 Labs reviewed: Basic Metabolic Panel:  Recent Labs  12/15/13 1935 12/16/13 0435 12/16/13 1700  12/18/13 0401 12/19/13 0230 12/20/13 0349  NA 144 145  --   < > 139 140 141  K 4.3 4.3  --   < > 4.6 3.9 3.7  CL 107 111  --   < > 103 100 101  CO2  --  23  --   < > 27 30 32  GLUCOSE 124* 142*  --   < > 114* 105* 83  BUN 5* 7  --   < > 13 12 10   CREATININE 0.47*  0.50 0.43* 0.51  < > 0.57 0.48* 0.42*  CALCIUM  --  7.9*  --   < > 8.2* 8.4 8.7  MG 3.2* 2.5 2.4  --   --   --   --   < > = values in this interval not displayed. Liver Function Tests:  Recent Labs  07/11/13 1543 12/13/13 1606  AST 17 18  ALT 18 13  ALKPHOS  --  134*  BILITOT  --  <  0.2*  PROT  --  7.0  ALBUMIN  --  3.5   CBC:  Recent Labs  07/11/13 1543  12/17/13 0330 12/18/13 0401 12/20/13 0349  WBC 8.9  < > 8.6 8.8 5.9  NEUTROABS 5.8  --   --   --   --   HGB 14.6  < > 10.0* 9.7* 10.3*  HCT 42.7  < > 30.8* 30.7* 32.5*  MCV 90.2  < > 94.2 94.8 94.2  PLT 221.0  < > 126* 139* 169  < > = values in this interval not displayed.  CBG:  Recent Labs  12/17/13 0340 12/17/13 1152 12/17/13 1543  GLUCAP 109* 118* 112*   CLINICAL DATA:  Status post CABG.  Pleural effusions   EXAM: CHEST  2 VIEW   COMPARISON:  12/20/2013   FINDINGS: Stable heart size and mediastinal  contour status post CABG. No edema or consolidation. Small bilateral pleural effusions. The fluid on the right has decreased since prior. The fluid on the left appears mildly larger. No pneumothorax.   IMPRESSION: Small bilateral pleural effusions with mild interval decrease on the right and increase on the left.     ASSESSMENT/PLAN:  Severe 3-Vessel Coronary Artery Disease S/P CABG - for Home health PT, OT and Nursing Atrial Fibrillation - rate controlled; continue Paceron and Lopressor Anemia, acute blood loss - stable Hypertension - continue Cozaar and Lopressor Hyperlipidemia - continue Zocor COPD - well controlled; continue Spiriva Anxiety - stable; continue Klonopin when necessary  I have filled out patient's discharge paperwork and written prescriptions.  Patient will receive home health PT, OT and Nursing.  DME: O2 @ 2L/min via [place] continuously  Total discharge time: Greater than 30 minutes Discharge time involved coordination of the discharge process with social worker, nursing staff and therapy department. Medical justification for home health services/DME verified.  CPT CODE: 29937  Seth Bake - NP [facility] [phone]

## 2014-01-06 DIAGNOSIS — J441 Chronic obstructive pulmonary disease with (acute) exacerbation: Secondary | ICD-10-CM | POA: Diagnosis not present

## 2014-01-06 DIAGNOSIS — I739 Peripheral vascular disease, unspecified: Secondary | ICD-10-CM | POA: Diagnosis not present

## 2014-01-06 DIAGNOSIS — I10 Essential (primary) hypertension: Secondary | ICD-10-CM | POA: Diagnosis not present

## 2014-01-06 DIAGNOSIS — F411 Generalized anxiety disorder: Secondary | ICD-10-CM | POA: Diagnosis not present

## 2014-01-06 DIAGNOSIS — I2510 Atherosclerotic heart disease of native coronary artery without angina pectoris: Secondary | ICD-10-CM | POA: Diagnosis not present

## 2014-01-06 DIAGNOSIS — Z48812 Encounter for surgical aftercare following surgery on the circulatory system: Secondary | ICD-10-CM | POA: Diagnosis not present

## 2014-01-10 ENCOUNTER — Ambulatory Visit (INDEPENDENT_AMBULATORY_CARE_PROVIDER_SITE_OTHER): Payer: Medicare Other | Admitting: Physician Assistant

## 2014-01-10 ENCOUNTER — Encounter: Payer: Self-pay | Admitting: Physician Assistant

## 2014-01-10 VITALS — BP 144/60 | HR 51 | Ht 63.0 in | Wt 134.0 lb

## 2014-01-10 DIAGNOSIS — I4891 Unspecified atrial fibrillation: Secondary | ICD-10-CM

## 2014-01-10 DIAGNOSIS — I6529 Occlusion and stenosis of unspecified carotid artery: Secondary | ICD-10-CM

## 2014-01-10 DIAGNOSIS — I2510 Atherosclerotic heart disease of native coronary artery without angina pectoris: Secondary | ICD-10-CM | POA: Diagnosis not present

## 2014-01-10 DIAGNOSIS — E785 Hyperlipidemia, unspecified: Secondary | ICD-10-CM

## 2014-01-10 DIAGNOSIS — F17200 Nicotine dependence, unspecified, uncomplicated: Secondary | ICD-10-CM

## 2014-01-10 DIAGNOSIS — I10 Essential (primary) hypertension: Secondary | ICD-10-CM | POA: Diagnosis not present

## 2014-01-10 DIAGNOSIS — J90 Pleural effusion, not elsewhere classified: Secondary | ICD-10-CM

## 2014-01-10 MED ORDER — LOSARTAN POTASSIUM 50 MG PO TABS: 50.0000 mg | ORAL_TABLET | Freq: Every day | ORAL | Status: DC

## 2014-01-10 MED ORDER — AMIODARONE HCL 200 MG PO TABS: 200.0000 mg | ORAL_TABLET | Freq: Every day | ORAL | Status: DC

## 2014-01-10 NOTE — Progress Notes (Signed)
Cardiology Office Note   Date:  01/10/2014   ID:  Patricia [name], Patricia [name] 07-19-1941, MRN [id number]  PCP:  Kathlene November, MD  Cardiologist:  Dr. Sherren Mocha      History of Present Illness: Patricia [name] is a 73 y.o. female with a hx of CAD s/p PCI of the CFX with restenosis of a complex area of the CFX/OM bifurcation tx with cutting balloon angioplasty in 06/2012, carotid stenosis, HL, COPD, HTN.  She was seen in 10/2013 with worsening angina (CCS class 3).  Nitrates were adjusted but she had no improvement.    LHC was arranged and demonstrated 2 v CAD with chronic severe stenosis of the RCA collateralized from the LCA and severe OM1 stenosis.  Options were limited and she was referred for CABG.  She underwent CABG with Dr. Cyndia Bent on 12/15/13 (L-OM1, S-OM2, S-RCA).  Post op course was complicated by paroxysmal AFib.  She was placed on Amiodarone.  She was aggressively diuresed for volume excess and pleural effusions.  She was d/c to SNF.    She returns for follow up.  She is now home from SNF.  She is doing better.  Chest is still sore.  No fevers or chills.  She is still sleeping propped up.  Denies PND.  Sleeps only 2-3 hours at a time.  No syncope.  No cough.  No significant edema.  She is still on Lasix.     Studies:  - LHC (11/28/13):  prox LAD 20-30%, CFX stent 50% ISR, ostial OM1 50%, prox OM1 90%, ostial RCA 90%, dist L-R collats, EF 55-65%,   - Echo (06/2011):  Mild LVH, EF 60-65%, no RWMA  - Carotid US (12/13/13):  R 1-39%; L 40-59%   Recent Labs: 12/13/2013: ALT 13  12/20/2013: Creatinine 0.42*; Hemoglobin 10.3*; Potassium 3.7   Wt Readings from Last 3 Encounters:  01/10/14 134 lb (60.782 kg)  01/03/14 141 lb 3.2 oz (64.048 kg)  12/26/13 140 lb 6.4 oz (63.685 kg)     Past Medical History  Diagnosis Date  . Osteopenia   . COPD (chronic obstructive pulmonary disease)   . Hyperlipidemia   . Thyroid nodule   . Abnormal LFTs   . Depression   . HTN (hypertension)     intolerant to  ACEi (cough)  . Allergic rhinitis   . Anxiety   . Panic attacks   . PAD (peripheral artery disease)     Carotid dz as  below, and at [facility] 11/12:  critical stenosis noted just below the sheath site leading into a totally occluded SFA and a patent deep femoral artery  . Carotid artery disease     Moderate - f/u dopplers needed 11/2012  . CAD (coronary artery disease)     s/p DES to the circumflex 12/11;  06/2011: complex PCI (POBA) of the CFX/OM bifurcation with IVUS guidance; [place] s/p PTCA/cutting balloon to LCx 06/2012 (chronic subtotally occ RCA with L-R collaterals, residual nonobst LAD dz)   . 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 Shortness of breath     exertional    Current Outpatient Prescriptions  Medication Sig Dispense Refill  . amiodarone (PACERONE) 200 MG tablet Take 1 tablet (200 mg total) by mouth 2 (two) times daily.      Marland Kitchen aspirin EC 325 MG EC tablet Take 1 tablet (325 mg total) by mouth daily.    0  . Cholecalciferol (VITAMIN D3) 1000 UNITS CAPS Take 1,000 Units by mouth daily.      Marland Kitchen  clonazePAM (KLONOPIN) 0.5 MG tablet Take 1 tablet (0.5 mg total) by mouth 2 (two) times daily as needed for anxiety.  60 tablet  0  . furosemide (LASIX) 40 MG tablet Take 1 tablet (40 mg total) by mouth daily.  5 tablet  0  . losartan (COZAAR) 25 MG tablet Take 1 tablet (25 mg total) by mouth daily.      . metoprolol tartrate (LOPRESSOR) 25 MG tablet Take 25 mg by mouth 2 (two) times daily.      . potassium chloride SA (K-DUR,KLOR-CON) 20 MEQ tablet Take 1 tablet (20 mEq total) by mouth daily.  5 tablet  0  . simvastatin (ZOCOR) 20 MG tablet Take 20 mg by mouth daily.      Marland Kitchen tiotropium (SPIRIVA) 18 MCG inhalation capsule Place 1 capsule (18 mcg total) into inhaler and inhale daily.  30 capsule  5  . traMADol (ULTRAM) 50 MG tablet Take 1 tablet (50 mg total) by mouth every 6 (six) hours as needed for moderate pain.  30 tablet  0   No current facility-administered medications for this visit.     Allergies:   Ace inhibitors   Social History:  The patient  reports that she has been smoking Cigarettes.  She has a 27 pack-year smoking history. She has never used smokeless tobacco. She reports that she drinks about 3.6 ounces of alcohol per week. She reports that she does not use illicit drugs.   Family History:  The patient's family history includes Coronary artery disease in her father; Heart attack in her brother and father; Hypertension in her mother. There is no history of Stroke, Colon cancer, or Breast cancer.   ROS:  Please see the history of present illness.      All other systems reviewed and negative.   PHYSICAL EXAM: VS:  BP 144/60  Pulse 51  Ht 5\' 3"  (1.6 m)  Wt 134 lb (60.782 kg)  BMI 23.74 kg/m2 Well nourished, well developed, in no acute distress HEENT: normal Neck: no JVD Cardiac:  normal S1, S2; RRR; no murmur Chest: median sternotomy wound well healed without erythema or d/c Lungs:  Decreased breath sounds bilaterally, ? Crackles at the bases; ? E=>A changes L base Abd: soft, nontender, no hepatomegaly Ext: no edema Skin: warm and dry Neuro:  CNs 2-12 intact, no focal abnormalities noted  EKG:  Sinus brady, HR 51, NSSTTW changes, QTc 435ms     ASSESSMENT AND PLAN:  1. Coronary atherosclerosis of native coronary artery:  Progressing well after recent CABG.  She is not yet ready to consider cardiac rehab.  She will let us know.  Continue ASA, beta blocker, statin. 2. Atrial fibrillation:  Maintaining NSR.  She can finish her bottle of Amiodarone and stop it. 3. Essential hypertension, benign:  BP above target.  Check BMET today.  Increase Losartan to 50 mg QD.  Check BMET in 1-2 weeks.  4. HYPERLIPIDEMIA :  Continue statin. 5. CAROTID ARTERY DISEASE:  F/u carotid US in 1 year.  6. TOBACCO ABUSE:  She has quit smoking.  7. Pleural effusion:  By exam, she may have some residual.  She sees Dr. Cyndia Bent next week and will have a CXR then.   8. Disposition: F/u  with Dr. Sherren Mocha in 8 weeks.    Signed, Versie Starks, MHS 01/10/2014 3:39 PM    [facility] [address], [place], [place]  [address] Phone: [phone]; Fax: [phone]

## 2014-01-10 NOTE — Patient Instructions (Signed)
INCREASE LOSARTAN TO 50 MG DAILY; NEW RX SENT IN TODAY  FINISH YOUR CURRENT BOTTLE OF AMIODARONE; ONCE DONE YOU WILL THEN STOP  LAB WORK TODAY; BMET  YOU HAVE BEEN GIVEN AN RX TO HAVE REPEAT LAB WORK (BMET) [place] RN WITH THE RESULTS TO BE FAXED TO [place], [place]. THIS NEEDS TO BE DONE IN 1-2 WEEKS  PLEASE FOLLOW UP WITH DR. Burt Knack IN 8 WEEKS

## 2014-01-11 ENCOUNTER — Telehealth: Payer: Self-pay | Admitting: *Deleted

## 2014-01-11 ENCOUNTER — Telehealth: Payer: Self-pay | Admitting: Internal Medicine

## 2014-01-11 DIAGNOSIS — I2510 Atherosclerotic heart disease of native coronary artery without angina pectoris: Secondary | ICD-10-CM | POA: Diagnosis not present

## 2014-01-11 DIAGNOSIS — I739 Peripheral vascular disease, unspecified: Secondary | ICD-10-CM | POA: Diagnosis not present

## 2014-01-11 DIAGNOSIS — I10 Essential (primary) hypertension: Secondary | ICD-10-CM | POA: Diagnosis not present

## 2014-01-11 DIAGNOSIS — Z48812 Encounter for surgical aftercare following surgery on the circulatory system: Secondary | ICD-10-CM | POA: Diagnosis not present

## 2014-01-11 DIAGNOSIS — F411 Generalized anxiety disorder: Secondary | ICD-10-CM | POA: Diagnosis not present

## 2014-01-11 DIAGNOSIS — J441 Chronic obstructive pulmonary disease with (acute) exacerbation: Secondary | ICD-10-CM | POA: Diagnosis not present

## 2014-01-11 LAB — BASIC METABOLIC PANEL
BUN: 12 mg/dL (ref 6–23)
CHLORIDE: 103 meq/L (ref 96–112)
CO2: 30 mEq/L (ref 19–32)
Calcium: 9.2 mg/dL (ref 8.4–10.5)
Creatinine, Ser: 0.8 mg/dL (ref 0.4–1.2)
GFR: 80.31 mL/min (ref >60.00)
GLUCOSE: 103 mg/dL — AB (ref 70–99)
POTASSIUM: 3.9 meq/L (ref 3.5–5.1)
Sodium: 140 mEq/L (ref 135–145)

## 2014-01-11 NOTE — Telephone Encounter (Signed)
Spoke with Neoma Laming, RN from [place] who provided an update to patients progress. Patient has refused PT, including the assessment. Neoma Laming states that the patient is progressing well and that she will continue to follow and update our office prn

## 2014-01-11 NOTE — Telephone Encounter (Signed)
Caller name: Neoma Laming  Relation to LJ:QGBEE Call back number: [phone] Pharmacy:  Reason for call: Neoma Laming, from [place] called to inform us that after Cathyrn was discharged from [facility] place she was admitted. Neoma Laming will start seeing her one time a week for one week, then two times a week for two weeks, finally one time a week for two weeks. Also patient refused PT and OT. Please advise

## 2014-01-11 NOTE — Telephone Encounter (Signed)
pt notified about lab rsults with verbal understanding

## 2014-01-13 ENCOUNTER — Encounter: Payer: Medicare Other | Admitting: Internal Medicine

## 2014-01-13 DIAGNOSIS — F411 Generalized anxiety disorder: Secondary | ICD-10-CM | POA: Diagnosis not present

## 2014-01-13 DIAGNOSIS — I739 Peripheral vascular disease, unspecified: Secondary | ICD-10-CM | POA: Diagnosis not present

## 2014-01-13 DIAGNOSIS — I10 Essential (primary) hypertension: Secondary | ICD-10-CM | POA: Diagnosis not present

## 2014-01-13 DIAGNOSIS — Z48812 Encounter for surgical aftercare following surgery on the circulatory system: Secondary | ICD-10-CM | POA: Diagnosis not present

## 2014-01-13 DIAGNOSIS — I2510 Atherosclerotic heart disease of native coronary artery without angina pectoris: Secondary | ICD-10-CM | POA: Diagnosis not present

## 2014-01-13 DIAGNOSIS — J441 Chronic obstructive pulmonary disease with (acute) exacerbation: Secondary | ICD-10-CM | POA: Diagnosis not present

## 2014-01-17 DIAGNOSIS — Z48812 Encounter for surgical aftercare following surgery on the circulatory system: Secondary | ICD-10-CM | POA: Diagnosis not present

## 2014-01-17 DIAGNOSIS — F411 Generalized anxiety disorder: Secondary | ICD-10-CM | POA: Diagnosis not present

## 2014-01-17 DIAGNOSIS — I10 Essential (primary) hypertension: Secondary | ICD-10-CM | POA: Diagnosis not present

## 2014-01-17 DIAGNOSIS — I2510 Atherosclerotic heart disease of native coronary artery without angina pectoris: Secondary | ICD-10-CM | POA: Diagnosis not present

## 2014-01-17 DIAGNOSIS — J441 Chronic obstructive pulmonary disease with (acute) exacerbation: Secondary | ICD-10-CM | POA: Diagnosis not present

## 2014-01-17 DIAGNOSIS — I739 Peripheral vascular disease, unspecified: Secondary | ICD-10-CM | POA: Diagnosis not present

## 2014-01-18 ENCOUNTER — Other Ambulatory Visit: Payer: Self-pay | Admitting: Surgery

## 2014-01-18 ENCOUNTER — Ambulatory Visit
Admission: RE | Admit: 2014-01-18 | Discharge: 2014-01-18 | Disposition: A | Discharge status: Home / Self Care | Payer: Medicare Other | Source: Ambulatory Visit | Attending: Surgery | Admitting: Surgery

## 2014-01-18 ENCOUNTER — Ambulatory Visit (INDEPENDENT_AMBULATORY_CARE_PROVIDER_SITE_OTHER): Payer: Self-pay | Admitting: Surgery

## 2014-01-18 ENCOUNTER — Encounter: Payer: Self-pay | Admitting: Surgery

## 2014-01-18 VITALS — BP 169/71 | HR 50 | Resp 20 | Ht 63.0 in | Wt 134.0 lb

## 2014-01-18 DIAGNOSIS — Z951 Presence of aortocoronary bypass graft: Secondary | ICD-10-CM | POA: Diagnosis not present

## 2014-01-18 DIAGNOSIS — I2510 Atherosclerotic heart disease of native coronary artery without angina pectoris: Secondary | ICD-10-CM

## 2014-01-18 DIAGNOSIS — J90 Pleural effusion, not elsewhere classified: Secondary | ICD-10-CM | POA: Diagnosis not present

## 2014-01-18 DIAGNOSIS — I4891 Unspecified atrial fibrillation: Secondary | ICD-10-CM

## 2014-01-18 NOTE — Progress Notes (Signed)
     HPI:  Patient returns for routine postoperative follow-up having undergone CABG x 3  on 12/15/2013. The patient's early postoperative recovery while in the hospital was notable for development of postop atrial fibrillation converted with amiodarone. Since hospital discharge the patient reports that she is feeling better every day. She is walking without chest pain or shortness of breath. She has continued to abstain from smoking.   Current Outpatient Prescriptions  Medication Sig Dispense Refill  . amiodarone (PACERONE) 200 MG tablet Take 1 tablet (200 mg total) by mouth daily. Once you finish current bottle you will then STOP      . aspirin EC 325 MG EC tablet Take 1 tablet (325 mg total) by mouth daily.    0  . Cholecalciferol (VITAMIN D3) 1000 UNITS CAPS Take 1,000 Units by mouth daily.      . clonazePAM (KLONOPIN) 0.5 MG tablet Take 1 tablet (0.5 mg total) by mouth 2 (two) times daily as needed for anxiety.  60 tablet  0  . furosemide (LASIX) 40 MG tablet Take 1 tablet (40 mg total) by mouth daily.  5 tablet  0  . losartan (COZAAR) 50 MG tablet Take 1 tablet (50 mg total) by mouth daily.  60 tablet  11  . metoprolol tartrate (LOPRESSOR) 25 MG tablet Take 25 mg by mouth 2 (two) times daily.      . potassium chloride SA (K-DUR,KLOR-CON) 20 MEQ tablet Take 1 tablet (20 mEq total) by mouth daily.  5 tablet  0  . simvastatin (ZOCOR) 20 MG tablet Take 20 mg by mouth daily.      Marland Kitchen tiotropium (SPIRIVA) 18 MCG inhalation capsule Place 1 capsule (18 mcg total) into inhaler and inhale daily.  30 capsule  5  . traMADol (ULTRAM) 50 MG tablet Take 1 tablet (50 mg total) by mouth every 6 (six) hours as needed for moderate pain.  30 tablet  0   No current facility-administered medications for this visit.    Physical Exam: BP 169/71  Pulse 50  Resp 20  Ht 5\' 3"  (1.6 m)  Wt 134 lb (60.782 kg)  BMI 23.74 kg/m2  SpO2 96% She looks well. Lung exam is clear. Cardiac exam shows a regular rate and  rhythm with normal heart sounds. Chest incision is healing well and sternum is stable. The leg incisions are healing well and there is no peripheral edema.   Diagnostic Tests:  CLINICAL DATA: Smoker. CABG.  EXAM:  CHEST 2 VIEW  COMPARISON: Chest x-ray 12/24/2013.  FINDINGS:  Mediastinum and hilar structures are normal. Prior CABG.  Cardiomegaly with normal pulmonary vascularity. Interim improvement  of bilateral pleural effusions. Small pleural effusions persist. No  acute bony abnormality.  IMPRESSION:  1. Prior CABG.  2. Interim partial resolution of bilateral effusions.  Electronically Signed  By: Marcello Moores Register  On: 01/18/2014 11:16    Impression:  Overall I think she is doing well. I encouraged her to continue walking. She is planning to participate in cardiac rehab. I told her that she could drive her car but should not lift anything heavier than 10 lbs for three months postop. I told her that she could discontinue the amiodarone since she is maintaining sinus rhythm.   Plan:  She will continue to follow up with Dr. Larose Kells and Dr. Burt Knack and will contact me if she has any problems with her incisions.

## 2014-01-19 ENCOUNTER — Telehealth: Payer: Self-pay | Admitting: *Deleted

## 2014-01-19 ENCOUNTER — Telehealth (HOSPITAL_COMMUNITY): Payer: Self-pay | Admitting: *Deleted

## 2014-01-19 DIAGNOSIS — I2510 Atherosclerotic heart disease of native coronary artery without angina pectoris: Secondary | ICD-10-CM | POA: Diagnosis not present

## 2014-01-19 DIAGNOSIS — F411 Generalized anxiety disorder: Secondary | ICD-10-CM | POA: Diagnosis not present

## 2014-01-19 DIAGNOSIS — J441 Chronic obstructive pulmonary disease with (acute) exacerbation: Secondary | ICD-10-CM | POA: Diagnosis not present

## 2014-01-19 DIAGNOSIS — I10 Essential (primary) hypertension: Secondary | ICD-10-CM | POA: Diagnosis not present

## 2014-01-19 NOTE — Telephone Encounter (Signed)
Received home health certification and plan of care via fax from [place]. Billing sheet attached and forwarded to Dr. Larose Kells. JG//CMA

## 2014-01-19 NOTE — Telephone Encounter (Signed)
Contacted by rehab staff for cardiac rehab.  Pt currently received home health nurse visits. Pt not quite ready to attend cardiac rehab.  Requested call back first of July. Informed pt that is she felt ready prior to the first of July to please call.  Contact information given.

## 2014-01-20 DIAGNOSIS — I2510 Atherosclerotic heart disease of native coronary artery without angina pectoris: Secondary | ICD-10-CM | POA: Diagnosis not present

## 2014-01-20 DIAGNOSIS — J441 Chronic obstructive pulmonary disease with (acute) exacerbation: Secondary | ICD-10-CM | POA: Diagnosis not present

## 2014-01-20 DIAGNOSIS — Z48812 Encounter for surgical aftercare following surgery on the circulatory system: Secondary | ICD-10-CM | POA: Diagnosis not present

## 2014-01-20 DIAGNOSIS — I10 Essential (primary) hypertension: Secondary | ICD-10-CM | POA: Diagnosis not present

## 2014-01-20 DIAGNOSIS — I739 Peripheral vascular disease, unspecified: Secondary | ICD-10-CM | POA: Diagnosis not present

## 2014-01-20 DIAGNOSIS — F411 Generalized anxiety disorder: Secondary | ICD-10-CM | POA: Diagnosis not present

## 2014-01-24 ENCOUNTER — Encounter: Payer: Self-pay | Admitting: Cardiovascular Disease

## 2014-01-24 DIAGNOSIS — F411 Generalized anxiety disorder: Secondary | ICD-10-CM | POA: Diagnosis not present

## 2014-01-24 DIAGNOSIS — I10 Essential (primary) hypertension: Secondary | ICD-10-CM | POA: Diagnosis not present

## 2014-01-24 DIAGNOSIS — J441 Chronic obstructive pulmonary disease with (acute) exacerbation: Secondary | ICD-10-CM | POA: Diagnosis not present

## 2014-01-24 DIAGNOSIS — Z48812 Encounter for surgical aftercare following surgery on the circulatory system: Secondary | ICD-10-CM | POA: Diagnosis not present

## 2014-01-24 DIAGNOSIS — I2510 Atherosclerotic heart disease of native coronary artery without angina pectoris: Secondary | ICD-10-CM | POA: Diagnosis not present

## 2014-01-24 DIAGNOSIS — I739 Peripheral vascular disease, unspecified: Secondary | ICD-10-CM | POA: Diagnosis not present

## 2014-01-25 ENCOUNTER — Encounter: Payer: Self-pay | Admitting: Internal Medicine

## 2014-01-25 ENCOUNTER — Ambulatory Visit (INDEPENDENT_AMBULATORY_CARE_PROVIDER_SITE_OTHER): Payer: Medicare Other | Admitting: Internal Medicine

## 2014-01-25 VITALS — BP 172/73 | HR 60 | Temp 98.0°F | Wt 131.0 lb

## 2014-01-25 DIAGNOSIS — I2510 Atherosclerotic heart disease of native coronary artery without angina pectoris: Secondary | ICD-10-CM

## 2014-01-25 DIAGNOSIS — F419 Anxiety disorder, unspecified: Secondary | ICD-10-CM

## 2014-01-25 DIAGNOSIS — J449 Chronic obstructive pulmonary disease, unspecified: Secondary | ICD-10-CM

## 2014-01-25 DIAGNOSIS — R739 Hyperglycemia, unspecified: Secondary | ICD-10-CM | POA: Insufficient documentation

## 2014-01-25 DIAGNOSIS — I10 Essential (primary) hypertension: Secondary | ICD-10-CM | POA: Diagnosis not present

## 2014-01-25 DIAGNOSIS — R7309 Other abnormal glucose: Secondary | ICD-10-CM

## 2014-01-25 DIAGNOSIS — R7303 Prediabetes: Secondary | ICD-10-CM

## 2014-01-25 DIAGNOSIS — F329 Major depressive disorder, single episode, unspecified: Secondary | ICD-10-CM

## 2014-01-25 DIAGNOSIS — F341 Dysthymic disorder: Secondary | ICD-10-CM | POA: Diagnosis not present

## 2014-01-25 MED ORDER — AMLODIPINE BESYLATE 5 MG PO TABS: 5.0000 mg | ORAL_TABLET | Freq: Every day | ORAL | Status: DC

## 2014-01-25 NOTE — Assessment & Plan Note (Signed)
Status post CABG 12/2013. Seems to be doing well. I did encourage her to do cardiac rehabilitation

## 2014-01-25 NOTE — Assessment & Plan Note (Signed)
She quit tobacco since CABG 12/2013 ! Praised

## 2014-01-25 NOTE — Assessment & Plan Note (Signed)
Losartan was increased on 01-10-14 as BP was elevated . A BMP was checked yesterday by the home health agency and will be faxed to cardiology. BP is still elevated. Chart reviewed, she was taken amlodipine 5 mg before CABG, I don't see a contraindication for amlodipine consequently   will restart that. Encourage to check her BP regularly and let me know if  is not improving

## 2014-01-25 NOTE — Progress Notes (Signed)
Subjective:    Patient ID: Patricia [name], female    DOB: 05/12/1941, 73 y.o.   MRN: [id number]  DOS:  01/25/2014 Type of  Visit: ROV History: Since the last time she was here, she had a CABG in May, hospital discharge notes, cardiology and cardiovascular surgery followup notes reviewed. She is now off amiodarone. She had a persistent pleural effusion but the last chest x-ray looks ok. BP is not well controlled, on 6-15 cardiology increase losartan. A BMP was checked yesterday by the home health nurse     ROS In general she feels that is gradually recovering, she is a little depressed because her progress is slow. No anxiety. No actual difficulty breathing or lower extremity edema. Appetite is very good. Still has postop pain, ultram was switch to OxyContin  Past Medical History  Diagnosis Date  . Osteopenia   . COPD (chronic obstructive pulmonary disease)   . Hyperlipidemia   . Thyroid nodule   . Abnormal LFTs   . Depression   . HTN (hypertension)     intolerant to ACEi (cough)  . Allergic rhinitis   . Anxiety   . Panic attacks   . PAD (peripheral artery disease)     Carotid dz as  below, and at [facility] 11/12:  critical stenosis noted just below the sheath site leading into a totally occluded SFA and a patent deep femoral artery  . Carotid artery disease     Moderate - f/u dopplers needed 11/2012  . CAD (coronary artery disease)     s/p DES to the circumflex 12/11;  06/2011: complex PCI (POBA) of the CFX/OM bifurcation with IVUS guidance; [place] s/p PTCA/cutting balloon to LCx 06/2012 (chronic subtotally occ RCA with L-R collaterals, residual nonobst LAD dz)   . 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 Shortness of breath     exertional    Past Surgical History  Procedure Laterality Date  . Coronary angioplasty with stent placement      "1; makes total of 3" (07/05/2012)  . Vaginal hysterectomy  ~ 1976    no oophorectomy  . 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 Coronary artery bypass graft N/A 12/15/2013    Procedure: CORONARY ARTERY BYPASS GRAFTING (CABG);  Surgeon: Gaye Pollack, MD;  Location: [place];  Service: Open Heart Surgery;  Laterality: N/A;  Times 3 using left internal mammary artery and endoscopically harvested right saphenous vein   . Intraoperative transesophageal echocardiogram N/A 12/15/2013    Procedure: INTRAOPERATIVE TRANSESOPHAGEAL ECHOCARDIOGRAM;  Surgeon: Gaye Pollack, MD;  Location: [facility] OR;  Service: Open Heart Surgery;  Laterality: N/A;    History   Social History  . Marital Status: Widowed    Spouse Name: N/A    Number of Children: 1  . Years of Education: N/A   Occupational History  . Retired-2003    Social History Main Topics  . Smoking status: Current Every Day Smoker -- 0.50 packs/day for 54 years    Types: Cigarettes  . Smokeless tobacco: Never Used     Comment: quit tobacco 12-2013 after CABG  . Alcohol Use: 3.6 oz/week    6 Shots of liquor per week     Comment: 07/05/2012 "gin & tonic; 2 drinks ~ 3 X/wk"  . Drug Use: No  . Sexual Activity: No   Other Topics Concern  .  Not on file   Social History Narrative   G son lives w/ her         Medication List       This list is accurate as of: 01/25/14 11:59 PM.  Always use your most recent med list.               amLODipine 5 MG tablet  Commonly known as:  NORVASC  Take 1 tablet (5 mg total) by mouth daily.     aspirin 325 MG EC tablet  Take 1 tablet (325 mg total) by mouth daily.     clonazePAM 0.5 MG tablet  Commonly known as:  KLONOPIN  Take 1 tablet (0.5 mg total) by mouth 2 (two) times daily as needed for anxiety.     furosemide 40 MG tablet  Commonly known as:  LASIX  Take 1 tablet (40 mg total) by mouth daily.     losartan 50 MG tablet  Commonly known as:  COZAAR  Take 1 tablet (50 mg total) by mouth daily.     metoprolol tartrate 25 MG tablet  Commonly  known as:  LOPRESSOR  Take 25 mg by mouth 2 (two) times daily.     oxyCODONE 5 MG immediate release tablet  Commonly known as:  Oxy IR/ROXICODONE     potassium chloride SA 20 MEQ tablet  Commonly known as:  K-DUR,KLOR-CON  Take 1 tablet (20 mEq total) by mouth daily.     simvastatin 20 MG tablet  Commonly known as:  ZOCOR  Take 20 mg by mouth daily.     tiotropium 18 MCG inhalation capsule  Commonly known as:  SPIRIVA  Place 1 capsule (18 mcg total) into inhaler and inhale daily.     traMADol 50 MG tablet  Commonly known as:  ULTRAM  Take 1 tablet (50 mg total) by mouth every 6 (six) hours as needed for moderate pain.     Vitamin D3 1000 UNITS Caps  Take 1,000 Units by mouth daily.           Objective:   Physical Exam BP 172/73  Pulse 60  Temp(Src) 98 F (36.7 C)  Wt 131 lb (59.421 kg)  SpO2 94% General -- alert, well-developed, NAD.   Lungs -- normal respiratory effort, no intercostal retractions, no accessory muscle use, and normal breath sounds.  Heart-- normal rate, regular rhythm, no murmur.  Extremities-- no pretibial edema bilaterally  Neurologic--  alert & oriented X3. Speech normal, gait appropriate for age, strength symmetric and appropriate for age.  Psych-- Cognition and judgment appear intact. Cooperative with normal attention span and concentration. No anxious or depressed appearing.      Assessment & Plan:    Today , I spent more than  25  min with the patient: >50% of the time counseling regards depression from recent surgery Also reviewing the chart and labs ordered by other providers

## 2014-01-25 NOTE — Patient Instructions (Signed)
Add amlodipine, watch for swelling  Check the  blood pressure 2 or 3 times a  week be sure it is between 110/60 and 140/85. Ideal blood pressure is 120/80. If it is consistently higher or lower, let me know   Next visit fasting in 2 months

## 2014-01-25 NOTE — Assessment & Plan Note (Signed)
Last A1c slightly elevated, patient aware. For now I encouraged a healthy diet. Will recheck on return to the office

## 2014-01-25 NOTE — Assessment & Plan Note (Signed)
Feeling slightly depressed because the progress after that which is not quick enough, patient is counseled, knows to call me if she gets more depressed

## 2014-01-25 NOTE — Progress Notes (Signed)
Pre visit review using our clinic review tool, if applicable. No additional management support is needed unless otherwise documented below in the visit note. 

## 2014-01-26 ENCOUNTER — Telehealth: Payer: Self-pay | Admitting: Internal Medicine

## 2014-01-26 NOTE — Telephone Encounter (Signed)
Relevant patient education assigned to patient using Emmi. ° °

## 2014-01-31 ENCOUNTER — Telehealth: Payer: Self-pay | Admitting: Internal Medicine

## 2014-01-31 DIAGNOSIS — I10 Essential (primary) hypertension: Secondary | ICD-10-CM | POA: Diagnosis not present

## 2014-01-31 DIAGNOSIS — J441 Chronic obstructive pulmonary disease with (acute) exacerbation: Secondary | ICD-10-CM | POA: Diagnosis not present

## 2014-01-31 DIAGNOSIS — Z48812 Encounter for surgical aftercare following surgery on the circulatory system: Secondary | ICD-10-CM | POA: Diagnosis not present

## 2014-01-31 DIAGNOSIS — F411 Generalized anxiety disorder: Secondary | ICD-10-CM | POA: Diagnosis not present

## 2014-01-31 DIAGNOSIS — I2510 Atherosclerotic heart disease of native coronary artery without angina pectoris: Secondary | ICD-10-CM | POA: Diagnosis not present

## 2014-01-31 DIAGNOSIS — I739 Peripheral vascular disease, unspecified: Secondary | ICD-10-CM | POA: Diagnosis not present

## 2014-01-31 MED ORDER — OXYCODONE HCL 5 MG PO TABS: 5.0000 mg | ORAL_TABLET | Freq: Four times a day (QID) | ORAL | Status: DC | PRN

## 2014-01-31 NOTE — Telephone Encounter (Signed)
Pt notified of rx pick up at our front desk.

## 2014-01-31 NOTE — Telephone Encounter (Signed)
Caller name: Patricia [name] Relation to pt: patient Call back number: [phone] Pharmacy:  Reason for call: patient called to request a refill for oxyCODONE (OXY IR/ROXICODONE) 5 MG immediate

## 2014-01-31 NOTE — Telephone Encounter (Signed)
oxyCODONE (OXY IR/ROXICODONE) 5 MG immediate  Last OV- 01/25/14  Last refilled- historical Melessa Cowell UDS-07/13/13 Low risk.

## 2014-01-31 NOTE — Telephone Encounter (Signed)
Status post recent CABG, takes pain medication for postop pain. Okay to refill, prescription printed

## 2014-02-06 ENCOUNTER — Other Ambulatory Visit: Payer: Self-pay | Admitting: Internal Medicine

## 2014-02-07 NOTE — Telephone Encounter (Signed)
Pt is also wanting a refill on RX oxyCODONE (OXY IR/ROXICODONE) 5 MG immediate release tablet

## 2014-02-09 NOTE — Telephone Encounter (Signed)
Pt called in about this medication again.  Please advise.

## 2014-02-09 NOTE — Telephone Encounter (Signed)
Pt called in again regarding this RX.  Would like this before the weekend.  Thanks.

## 2014-02-13 ENCOUNTER — Other Ambulatory Visit: Payer: Self-pay

## 2014-02-13 MED ORDER — OXYCODONE HCL 5 MG PO TABS: 5.0000 mg | ORAL_TABLET | Freq: Four times a day (QID) | ORAL | Status: DC | PRN

## 2014-02-13 NOTE — Telephone Encounter (Signed)
Handle under separate message. See chart.

## 2014-02-13 NOTE — Telephone Encounter (Signed)
Patient presents to the office to pick up Rx for Oxycodone. Patient states that she called this morning and she was advised that it was ready to be picked up. The request had not been handled. Presented to Dr Larose Kells who agrees to fill #[phone] with no refills. Recommends that if patient is still experiencing this significant amount of pain that she needs to be seen. Advised patient who states that the pain is getting better mostly experiemcing when she lays down to sleep and first thing in the morning. Verbalizes understanding concerning office visit. Patient given Rx.

## 2014-02-16 ENCOUNTER — Encounter: Payer: Medicare Other | Admitting: Cardiovascular Disease

## 2014-02-17 DIAGNOSIS — M79609 Pain in unspecified limb: Secondary | ICD-10-CM | POA: Diagnosis not present

## 2014-02-17 DIAGNOSIS — M76899 Other specified enthesopathies of unspecified lower limb, excluding foot: Secondary | ICD-10-CM | POA: Diagnosis not present

## 2014-02-17 DIAGNOSIS — M25559 Pain in unspecified hip: Secondary | ICD-10-CM | POA: Diagnosis not present

## 2014-02-24 ENCOUNTER — Telehealth: Payer: Self-pay | Admitting: *Deleted

## 2014-02-24 NOTE — Telephone Encounter (Signed)
Received Discharge Summary form via from [place].  Placed in folder for Dr. Larose Kells to review and sign.  For our records only so no need to return back to [facility].//AB/CMA

## 2014-02-26 ENCOUNTER — Ambulatory Visit (INDEPENDENT_AMBULATORY_CARE_PROVIDER_SITE_OTHER): Payer: Medicare Other | Admitting: Family Medicine

## 2014-02-26 VITALS — BP 120/76 | HR 77 | Temp 98.3°F | Resp 16 | Ht 62.25 in | Wt 122.0 lb

## 2014-02-26 DIAGNOSIS — M25559 Pain in unspecified hip: Secondary | ICD-10-CM | POA: Diagnosis not present

## 2014-02-26 DIAGNOSIS — I2510 Atherosclerotic heart disease of native coronary artery without angina pectoris: Secondary | ICD-10-CM

## 2014-02-26 DIAGNOSIS — M25552 Pain in left hip: Secondary | ICD-10-CM

## 2014-02-26 MED ORDER — PREDNISONE 20 MG PO TABS: 40.0000 mg | ORAL_TABLET | Freq: Every day | ORAL | Status: DC

## 2014-02-26 NOTE — Progress Notes (Signed)
Is a 73 year old woman comes in with left hip pain for the past 2 weeks.. She's been treated at an urgent care center recently (one week ago) with cortisone shot to left hip.  Initially she did very well with a shot but now she has a dull ache it seemed to be getting worse.  She's had no fall  Patient says that she wakes up with pain at its peak and then after walking the pain seems to subside somewhat.  Objective: Examination left hip reveals full range of motion. She does have pain with extreme internal rotation and extreme extension. There is no tenderness or bony abnormality that is noted. Skin is intact.  Assessment: Mild bursitis persisting after left hip injection  Plan:Hip pain, acute, left - Plan: predniSONE (DELTASONE) 20 MG tablet  Signed, Robyn Haber, MD

## 2014-02-26 NOTE — Patient Instructions (Signed)
Hip Bursitis Bursitis is a swelling and soreness (inflammation) of a fluid-filled sac (bursa). This sac overlies and protects the joints.  CAUSES   Injury.  Overuse of the muscles surrounding the joint.  Arthritis.  Gout.  Infection.  Cold weather.  Inadequate warm-up and conditioning prior to activities. The cause may not be known.  SYMPTOMS   Mild to severe irritation.  Tenderness and swelling over the outside of the hip.  Pain with motion of the hip.  If the bursa becomes infected, a fever may be present. Redness, tenderness, and warmth will develop over the hip. Symptoms usually lessen in 3 to 4 weeks with treatment, but can come back. TREATMENT If conservative treatment does not work, your caregiver may advise draining the bursa and injecting cortisone into the area. This may speed up the healing process. This may also be used as an initial treatment of choice. HOME CARE INSTRUCTIONS   Apply ice to the affected area for 15-20 minutes every 3 to 4 hours while awake for the first 2 days. Put the ice in a plastic bag and place a towel between the bag of ice and your skin.  Rest the painful joint as much as possible, but continue to put the joint through a normal range of motion at least 4 times per day. When the pain lessens, begin normal, slow movements and usual activities to help prevent stiffness of the hip.  Only take over-the-counter or prescription medicines for pain, discomfort, or fever as directed by your caregiver.  Use crutches to limit weight bearing on the hip joint, if advised.  Elevate your painful hip to reduce swelling. Use pillows for propping and cushioning your legs and hips.  Gentle massage may provide comfort and decrease swelling. SEEK IMMEDIATE MEDICAL CARE IF:   Your pain increases even during treatment, or you are not improving.  You have a fever.  You have heat and inflammation over the involved bursa.  You have any other questions or  concerns. MAKE SURE YOU:   Understand these instructions.  Will watch your condition.  Will get help right away if you are not doing well or get worse. Document Released: 01/17/2002 Document Revised: 10/20/2011 Document Reviewed: 08/16/2008 ExitCare Patient Information 2015 [employer]. This information is not intended to replace advice given to you by your health care provider. Make sure you discuss any questions you have with your health care provider.  

## 2014-03-09 ENCOUNTER — Ambulatory Visit (INDEPENDENT_AMBULATORY_CARE_PROVIDER_SITE_OTHER): Payer: Medicare Other | Admitting: Cardiovascular Disease

## 2014-03-09 ENCOUNTER — Encounter: Payer: Self-pay | Admitting: Cardiovascular Disease

## 2014-03-09 VITALS — BP 120/70 | HR 70 | Ht 62.25 in | Wt 123.6 lb

## 2014-03-09 DIAGNOSIS — E785 Hyperlipidemia, unspecified: Secondary | ICD-10-CM

## 2014-03-09 DIAGNOSIS — I2510 Atherosclerotic heart disease of native coronary artery without angina pectoris: Secondary | ICD-10-CM

## 2014-03-09 NOTE — Progress Notes (Signed)
HPI:   73 year old woman presenting for followup of coronary artery disease and chronic angina. The patient has undergone multiple PCI procedures in the past. She has had nearly disabling anginal symptoms. At the time of her most recent heart catheterization, she was noted to have chronic occlusion of her right coronary artery and severe bifurcational disease in the left circumflex. The patient ultimately underwent CABG on May 7 was LIMA to obtuse marginal 1, saphenous vein graft to OM 2, and saphenous vein graft RCA.  She has been slowly improving from surgery. Her anginal symptoms are completely resolved. She feels much better, but still lacks energy. She has soreness and numbness around her sternotomy. Dyspnea is improved. No other complaints.  Outpatient Encounter Prescriptions as of 03/09/2014  Medication Sig  . amLODipine (NORVASC) 5 MG tablet Take 1 tablet (5 mg total) by mouth daily.  Marland Kitchen aspirin EC 325 MG EC tablet Take 1 tablet (325 mg total) by mouth daily.  . Cholecalciferol (VITAMIN D3) 1000 UNITS CAPS Take 1,000 Units by mouth daily.  . clonazePAM (KLONOPIN) 0.5 MG tablet Take 1 tablet (0.5 mg total) by mouth 2 (two) times daily as needed for anxiety.  Marland Kitchen losartan (COZAAR) 50 MG tablet Take 1 tablet (50 mg total) by mouth daily.  . metoprolol tartrate (LOPRESSOR) 25 MG tablet TAKE 1 TABLET BY MOUTH TWICE A DAY  . oxyCODONE (OXY IR/ROXICODONE) 5 MG immediate release tablet Take 1 tablet (5 mg total) by mouth every 6 (six) hours as needed for severe pain.  . simvastatin (ZOCOR) 20 MG tablet Take 20 mg by mouth daily.  Marland Kitchen tiotropium (SPIRIVA) 18 MCG inhalation capsule Place 1 capsule (18 mcg total) into inhaler and inhale daily.  . [DISCONTINUED] amiodarone (PACERONE) 200 MG tablet   . [DISCONTINUED] KLOR-CON M20 20 MEQ tablet TAKE 1 TABLET BY MOUTH EVERY DAY  . [DISCONTINUED] amiodarone (PACERONE) 200 MG tablet Take 1 tablet (200 mg total) by mouth daily. Once you finish current bottle  you will then STOP  . [DISCONTINUED] furosemide (LASIX) 40 MG tablet Take 1 tablet (40 mg total) by mouth daily.  . [DISCONTINUED] metoprolol tartrate (LOPRESSOR) 25 MG tablet Take 25 mg by mouth 2 (two) times daily.  . [DISCONTINUED] oxyCODONE (OXY IR/ROXICODONE) 5 MG immediate release tablet Take 1 tablet (5 mg total) by mouth every 6 (six) hours as needed for severe pain.  . [DISCONTINUED] potassium chloride SA (K-DUR,KLOR-CON) 20 MEQ tablet Take 1 tablet (20 mEq total) by mouth daily.  . [DISCONTINUED] traMADol (ULTRAM) 50 MG tablet Take 1 tablet (50 mg total) by mouth every 6 (six) hours as needed for moderate pain.    Allergies  Allergen Reactions  . Ace Inhibitors     cough    Past Medical History  Diagnosis Date  . Osteopenia   . COPD (chronic obstructive pulmonary disease)   . Hyperlipidemia   . Thyroid nodule   . Abnormal LFTs   . Depression   . HTN (hypertension)     intolerant to ACEi (cough)  . Allergic rhinitis   . Anxiety   . Panic attacks   . PAD (peripheral artery disease)     Carotid dz as  below, and at [facility] 11/12:  critical stenosis noted just below the sheath site leading into a totally occluded SFA and a patent deep femoral artery  . Carotid artery disease     Moderate - f/u dopplers needed 11/2012  . CAD (coronary artery disease)     s/p  DES to the circumflex 12/11;  06/2011: complex PCI (POBA) of the CFX/OM bifurcation with IVUS guidance; [place] s/p PTCA/cutting balloon to LCx 06/2012 (chronic subtotally occ RCA with L-R collaterals, residual nonobst LAD dz)   . 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 Shortness of breath     exertional   ROS: Negative except as per HPI  BP 120/70  Pulse 70  Ht 5' 2.25" (1.581 m)  Wt 123 lb 9.6 oz (56.065 kg)  BMI 22.43 kg/m2  PHYSICAL EXAM: Pt is alert and oriented, NAD HEENT: normal Neck: JVP - normal, carotids 2+= with bilateral bruits Lungs: CTA bilaterally CV: RRR without murmur or gallop Abd: soft, NT, Positive BS, no  hepatomegaly Ext: no C/C/E, distal pulses intact and equal Skin: warm/dry no rash  ASSESSMENT AND PLAN: 1. Coronary artery disease, native vessel. The patient is progressing very well after CABG him about 3 months out from surgery. She will continue on her current medications and I will plan on seeing her back in about 6 months. I think she is making good progress.  2. Postoperative atrial fibrillation. The patient discontinued amiodarone last month and has had no recurrence of atrial fib.  3. Hypertension. Blood pressure is well controlled on her current medications.  4. Long-standing tobacco abuse. The patient has quit smoking. She was applauded for her efforts.  Sherren Mocha 03/09/2014 2:31 PM

## 2014-03-09 NOTE — Patient Instructions (Signed)
Your physician recommends that you continue on your current medications as directed. Please refer to the Current Medication list given to you today. Your physician recommends that you return for lab work in: 6 months, prior to your appointment with Dr. Burt Knack.  Your physician wants you to follow-up in: 6 months with Dr. Burt Knack. You will receive a reminder letter in the mail two months in advance. If you don't receive a letter, please call our office to schedule the follow-up appointment.

## 2014-03-10 ENCOUNTER — Encounter: Payer: Self-pay | Admitting: Cardiovascular Disease

## 2014-04-06 ENCOUNTER — Ambulatory Visit (INDEPENDENT_AMBULATORY_CARE_PROVIDER_SITE_OTHER): Payer: Medicare Other | Admitting: Internal Medicine

## 2014-04-06 ENCOUNTER — Encounter: Payer: Self-pay | Admitting: Internal Medicine

## 2014-04-06 VITALS — BP 132/68 | HR 59 | Temp 98.4°F | Ht 63.0 in | Wt 126.0 lb

## 2014-04-06 DIAGNOSIS — M899 Disorder of bone, unspecified: Secondary | ICD-10-CM

## 2014-04-06 DIAGNOSIS — M949 Disorder of cartilage, unspecified: Secondary | ICD-10-CM | POA: Diagnosis not present

## 2014-04-06 DIAGNOSIS — J4489 Other specified chronic obstructive pulmonary disease: Secondary | ICD-10-CM

## 2014-04-06 DIAGNOSIS — F329 Major depressive disorder, single episode, unspecified: Secondary | ICD-10-CM

## 2014-04-06 DIAGNOSIS — Z23 Encounter for immunization: Secondary | ICD-10-CM | POA: Diagnosis not present

## 2014-04-06 DIAGNOSIS — E041 Nontoxic single thyroid nodule: Secondary | ICD-10-CM | POA: Diagnosis not present

## 2014-04-06 DIAGNOSIS — I10 Essential (primary) hypertension: Secondary | ICD-10-CM

## 2014-04-06 DIAGNOSIS — D62 Acute posthemorrhagic anemia: Secondary | ICD-10-CM | POA: Diagnosis not present

## 2014-04-06 DIAGNOSIS — J449 Chronic obstructive pulmonary disease, unspecified: Secondary | ICD-10-CM | POA: Diagnosis not present

## 2014-04-06 DIAGNOSIS — Z0000 Encounter for general adult medical examination without abnormal findings: Secondary | ICD-10-CM

## 2014-04-06 DIAGNOSIS — R300 Dysuria: Secondary | ICD-10-CM

## 2014-04-06 DIAGNOSIS — I2510 Atherosclerotic heart disease of native coronary artery without angina pectoris: Secondary | ICD-10-CM

## 2014-04-06 DIAGNOSIS — E785 Hyperlipidemia, unspecified: Secondary | ICD-10-CM

## 2014-04-06 DIAGNOSIS — F32A Depression, unspecified: Secondary | ICD-10-CM

## 2014-04-06 DIAGNOSIS — F341 Dysthymic disorder: Secondary | ICD-10-CM

## 2014-04-06 DIAGNOSIS — F419 Anxiety disorder, unspecified: Secondary | ICD-10-CM

## 2014-04-06 LAB — CBC WITH DIFFERENTIAL/PLATELET
BASOS ABS: 0 10*3/uL (ref 0.0–0.1)
Basophils Relative: 0.3 % (ref 0.0–3.0)
EOS ABS: 0.2 10*3/uL (ref 0.0–0.7)
Eosinophils Relative: 1.5 % (ref 0.0–5.0)
HCT: 46.2 % — ABNORMAL HIGH (ref 36.0–46.0)
Hemoglobin: 14.9 g/dL (ref 12.0–15.0)
LYMPHS PCT: 29.4 % (ref 12.0–46.0)
Lymphs Abs: 3.2 10*3/uL (ref 0.7–4.0)
MCHC: 32.3 g/dL (ref 30.0–36.0)
MCV: 84.6 fl (ref 78.0–100.0)
MONOS PCT: 5.3 % (ref 3.0–12.0)
Monocytes Absolute: 0.6 10*3/uL (ref 0.1–1.0)
Neutro Abs: 6.8 10*3/uL (ref 1.4–7.7)
Neutrophils Relative %: 63.5 % (ref 43.0–77.0)
PLATELETS: 229 10*3/uL (ref 150.0–400.0)
RBC: 5.46 Mil/uL — ABNORMAL HIGH (ref 3.87–5.11)
RDW: 16.4 % — AB (ref 11.5–15.5)
WBC: 10.8 10*3/uL — ABNORMAL HIGH (ref 4.0–10.5)

## 2014-04-06 LAB — URINALYSIS, ROUTINE W REFLEX MICROSCOPIC
Bilirubin Urine: NEGATIVE
HGB URINE DIPSTICK: NEGATIVE
KETONES UR: NEGATIVE
NITRITE: POSITIVE — AB
RBC / HPF: NONE SEEN (ref 0–?)
Specific Gravity, Urine: <=1.005 — AB (ref 1.000–1.030)
Total Protein, Urine: NEGATIVE
Urine Glucose: NEGATIVE
Urobilinogen, UA: 0.2 (ref 0.0–1.0)
pH: 5.5 (ref 5.0–8.0)

## 2014-04-06 LAB — LIPID PANEL
Cholesterol: 178 mg/dL (ref 0–200)
HDL: 57.1 mg/dL (ref >39.00)
LDL CALC: 100 mg/dL — AB (ref 0–99)
NONHDL: 120.9
Total CHOL/HDL Ratio: 3
Triglycerides: 105 mg/dL (ref 0.0–149.0)
VLDL: 21 mg/dL (ref 0.0–40.0)

## 2014-04-06 LAB — TSH: TSH: 1.36 u[IU]/mL (ref 0.35–4.50)

## 2014-04-06 MED ORDER — CLONAZEPAM 0.5 MG PO TABS: ORAL_TABLET | ORAL | Status: DC

## 2014-04-06 NOTE — Assessment & Plan Note (Signed)
Good compliance with statins, check FLP

## 2014-04-06 NOTE — Assessment & Plan Note (Signed)
CAD, status post CABG, recuperating well.

## 2014-04-06 NOTE — Assessment & Plan Note (Signed)
Postop anemia, check a CBC

## 2014-04-06 NOTE — Assessment & Plan Note (Signed)
Bone density test 11-2012  show osteopenia, recommend calcium and vitamin D

## 2014-04-06 NOTE — Assessment & Plan Note (Signed)
Td 08 prevnar pneumonia shot 07-18-10 shingles immunization--  Rx provided before    Never had an abnormal PAP, married once, s/p hysterectomy for fibroids -- rec no further screenings per current guidelines Breast cancer screening-- we agreed on stop screening (she is not interested and is 73 y/o )  Never had a colonoscopy, still  not interested , no FH, we discussed cologuard vs iFOB-- elected cologuard, referral dome

## 2014-04-06 NOTE — Assessment & Plan Note (Signed)
Currently without depression but she is a slightly anxious and has difficulty sleeping. Plan: Continue with lorazepam one in the morning as needed, she uses it very rarely Increase p.m. Clonazepam from 1-1.5 or 2 as needed for difficulty sleeping

## 2014-04-06 NOTE — Progress Notes (Signed)
Pre-visit discussion using our clinic review tool. No additional management support is needed unless otherwise documented below in the visit note.  

## 2014-04-06 NOTE — Assessment & Plan Note (Signed)
Currently on Spiriva, she quit tobacco, doing great.

## 2014-04-06 NOTE — Assessment & Plan Note (Signed)
Seems to be very well controlled with metoprolol, losartan, amlodipine. Also takes Lasix and potassium as needed for edema. Last BMP satisfactory. No change

## 2014-04-06 NOTE — Patient Instructions (Signed)
Get your blood work before you leave   Next visit is for routine check up in 4-5 months   Fall Prevention and [place] cause injuries and can affect all age groups. It is possible to use preventive measures to significantly decrease the likelihood of falls. There are many simple measures which can make your home safer and prevent falls. OUTDOORS  Repair cracks and edges of walkways and driveways.  Remove high doorway thresholds.  Trim shrubbery on the main path into your home.  Have good outside lighting.  Clear walkways of tools, rocks, debris, and clutter.  Check that handrails are not broken and are securely fastened. Both sides of steps should have handrails.  Have leaves, snow, and ice cleared regularly.  Use sand or salt on walkways during winter months.  In the garage, clean up grease or oil spills. BATHROOM  Install night lights.  Install grab bars by the toilet and in the tub and shower.  Use non-skid mats or decals in the tub or shower.  Place a plastic non-slip stool in the shower to sit on, if needed.  Keep floors dry and clean up all water on the floor immediately.  Remove soap buildup in the tub or shower on a regular basis.  Secure bath mats with non-slip, double-sided rug tape.  Remove throw rugs and tripping hazards from the floors. BEDROOMS  Install night lights.  Make sure a bedside light is easy to reach.  Do not use oversized bedding.  Keep a telephone by your bedside.  Have a firm chair with side arms to use for getting dressed.  Remove throw rugs and tripping hazards from the floor. KITCHEN  Keep handles on pots and pans turned toward the center of the stove. Use back burners when possible.  Clean up spills quickly and allow time for drying.  Avoid walking on wet floors.  Avoid hot utensils and knives.  Position shelves so they are not too high or low.  Place commonly used objects within easy reach.  If necessary,  use a sturdy step stool with a grab bar when reaching.  Keep electrical cables out of the way.  Do not use floor polish or wax that makes floors slippery. If you must use wax, use non-skid floor wax.  Remove throw rugs and tripping hazards from the floor. STAIRWAYS  Never leave objects on stairs.  Place handrails on both sides of stairways and use them. Fix any loose handrails. Make sure handrails on both sides of the stairways are as long as the stairs.  Check carpeting to make sure it is firmly attached along stairs. Make repairs to worn or loose carpet promptly.  Avoid placing throw rugs at the top or bottom of stairways, or properly secure the rug with carpet tape to prevent slippage. Get rid of throw rugs, if possible.  Have an electrician put in a light switch at the top and bottom of the stairs. OTHER FALL PREVENTION TIPS  Wear low-heel or rubber-soled shoes that are supportive and fit well. Wear closed toe shoes.  When using a stepladder, make sure it is fully opened and both spreaders are firmly locked. Do not climb a closed stepladder.  Add color or contrast paint or tape to grab bars and handrails in your home. Place contrasting color strips on first and last steps.  Learn and use mobility aids as needed. Install an electrical emergency response system.  Turn on lights to avoid dark areas. Replace light bulbs that  burn out immediately. Get light switches that glow.  Arrange furniture to create clear pathways. Keep furniture in the same place.  Firmly attach carpet with non-skid or double-sided tape.  Eliminate uneven floor surfaces.  Select a carpet pattern that does not visually hide the edge of steps.  Be aware of all pets. OTHER HOME SAFETY TIPS  Set the water temperature for 120 F (48.8 C).  Keep emergency numbers on or near the telephone.  Keep smoke detectors on every level of the home and near sleeping areas. Document Released: 07/18/2002 Document  Revised: 01/27/2012 Document Reviewed: 10/17/2011 [facility] Patient Information 2015 [place], [place]. This information is not intended to replace advice given to you by your health care provider. Make sure you discuss any questions you have with your health care provider.

## 2014-04-06 NOTE — Progress Notes (Signed)
Subjective:    Patient ID: Patricia [name], female    DOB: 1940/11/25, 73 y.o.   MRN: [id number]  DOS:  04/06/2014 Type of visit - description:   Here for Medicare AWV:  1. Risk factors based on Past M, S, F history: reviewed  2. Physical Activities: s/p rehab, stays active at home, shopping   3. Depression/mood: no depression, some anxiety (worried about medical issues) 4. Hearing: no problems reported or noted  5. ADL's: totally independent , still drives  6. Fall Risk: no recent falls , see instructions  7. Home Safety: does feel safe at home  8. Height, weight, &visual acuity:see VS, vision seems very good to patient . Has not seen the eye doctor lately, plans to do that this year, only uses reading glasses , strongly encouraged to call them 9. Counseling: yes  10. Labs ordered based on risk factors: yes  11. Referral Coordination, if needed  12. Care Plan, see a/p  13. Cognitive Assessment: cognition, motor skills and memory seem appropiate   in addition, we discussed the following Hypertension, good compliance of medication, ambulatory BPs usually within normal Tobacco, still smoking free!. CAD, edema: Saw cardiology 02-2014, she was felt to be stable, edema is well-controlled with Lasix/potassium as needed COPD, doing great, dyspnea on exertion has decrease, ambulatory O2 sat in the high 90s.  ROS No  CP. No palpitations, no lower extremity edema No orthopnea  Denies  nausea, vomiting diarrhea, blood in the stools  occ cough, sputum is clear, mostly from clearing throat, no hemoptysis No dysuria, gross hematuria, difficulty urinating but x 4 weeks her urine was cloudy-has a odor on-off No vaginal discharge-bleed, no genital rash     Past Medical History  Diagnosis Date  . Osteopenia   . COPD (chronic obstructive pulmonary disease)   . Hyperlipidemia   . Thyroid nodule   . Abnormal LFTs   . Anxiety and depression   . HTN (hypertension)     intolerant to ACEi (cough)    . Allergic rhinitis   . Panic attacks   . PAD (peripheral artery disease)     Carotid dz as  below, and at [facility] 11/12:  critical stenosis noted just below the sheath site leading into a totally occluded SFA and a patent deep femoral artery  . Carotid artery disease     Moderate - f/u dopplers needed 11/2012  . CAD (coronary artery disease)     s/p CABG  . 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 Coronary angioplasty with stent placement      "1; makes total of 3" (07/05/2012)  . Vaginal hysterectomy  ~ 1976    no oophorectomy  . 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 Coronary artery bypass graft N/A 12/15/2013    Procedure: CORONARY ARTERY BYPASS GRAFTING (CABG);  Surgeon: Gaye Pollack, MD;  Location: [facility];  Service: Open Heart Surgery;  Laterality: N/A;  Times 3 using left internal mammary artery and endoscopically harvested right saphenous vein   . Intraoperative transesophageal echocardiogram N/A 12/15/2013    Procedure: INTRAOPERATIVE TRANSESOPHAGEAL ECHOCARDIOGRAM;  Surgeon: Gaye Pollack, MD;  Location: [facility] OR;  Service: Open Heart Surgery;  Laterality: N/A;    History   Social History  . Marital Status: Widowed    Spouse  Name: N/A    Number of Children: 1  . Years of Education: N/A   Occupational History  . Retired-2003    Social History Main Topics  . Smoking status: Former Smoker -- 0.50 packs/day for 54 years    Types: Cigarettes    Quit date: 12/15/2013  . Smokeless tobacco: Never Used     Comment: quit tobacco 12-2013 after CABG  . Alcohol Use: No  . Drug Use: No  . Sexual Activity: No   Other Topics Concern  . Not on file   Social History Narrative   G son lives w/ her      Family History  Problem Relation Age of Onset  . Coronary artery disease Father   . Heart attack Father   . Hypertension Mother   . Stroke  Neg Hx   . Colon cancer Neg Hx   . Breast cancer Neg Hx   . Heart attack Brother        Medication List       This list is accurate as of: 04/06/14 10:00 PM.  Always use your most recent med list.               amLODipine 5 MG tablet  Commonly known as:  NORVASC  Take 1 tablet (5 mg total) by mouth daily.     aspirin 325 MG EC tablet  Take 1 tablet (325 mg total) by mouth daily.     clonazePAM 0.5 MG tablet  Commonly known as:  KLONOPIN  - 1 tab in the morning as neede for anxiety and   - 1.5 or 2 tabs at night as needed for insomnia     furosemide 40 MG tablet  Commonly known as:  LASIX  Take 1 tablet by mouth daily as needed.     KLOR-CON M20 20 MEQ tablet  Generic drug:  potassium chloride SA  Take 1 tablet by mouth daily as needed. Take it along with lasix     losartan 50 MG tablet  Commonly known as:  COZAAR  Take 1 tablet (50 mg total) by mouth daily.     metoprolol tartrate 25 MG tablet  Commonly known as:  LOPRESSOR  TAKE 1 TABLET BY MOUTH TWICE A DAY     simvastatin 20 MG tablet  Commonly known as:  ZOCOR  Take 20 mg by mouth daily.     tiotropium 18 MCG inhalation capsule  Commonly known as:  SPIRIVA  Place 1 capsule (18 mcg total) into inhaler and inhale daily.     Vitamin D3 1000 UNITS Caps  Take 1,000 Units by mouth daily.           Objective:   Physical Exam BP 132/68  Pulse 59  Temp(Src) 98.4 F (36.9 C) (Oral)  Ht 5\' 3"  (1.6 m)  Wt 126 lb (57.153 kg)  BMI 22.33 kg/m2  SpO2 95%  General -- alert, well-developed, NAD.  Neck --no thyromegaly , normal carotid pulse  HEENT-- Not pale. Lungs -- normal respiratory effort, no intercostal retractions, no accessory muscle use, and decreased breath sounds.  Heart-- normal rate, regular rhythm, no murmur.  Abdomen-- Not distended, good bowel sounds,soft, non-tender. No mass or bruit  Extremities-- no pretibial edema bilaterally  Neurologic--  alert & oriented X3. Speech normal, gait  appropriate for age, strength symmetric and appropriate for age.  Psych-- Cognition and judgment appear intact. Cooperative with normal attention span and concentration. No anxious or depressed appearing.  Assessment & Plan:   Uti? -- see ros, check a ua  F2F 37 min

## 2014-04-09 ENCOUNTER — Telehealth: Payer: Self-pay | Admitting: Internal Medicine

## 2014-04-09 LAB — URINE CULTURE: Colony Count: >=100000

## 2014-04-09 NOTE — Telephone Encounter (Signed)
Needs better chol control, has a uti, see phone note: D/c simvastatin, change to lipitor (on amlodipine) Bactrim ds bid x 3 Anemia resolved Elevated WBC d/t UTI?

## 2014-04-10 MED ORDER — ATORVASTATIN CALCIUM 40 MG PO TABS: 40.0000 mg | ORAL_TABLET | Freq: Every day | ORAL | Status: DC

## 2014-04-10 MED ORDER — SULFAMETHOXAZOLE-TMP DS 800-160 MG PO TABS: 1.0000 | ORAL_TABLET | Freq: Two times a day (BID) | ORAL | Status: DC

## 2014-04-10 NOTE — Telephone Encounter (Signed)
Call pt:, Needs Bactrim DS 1 twice a day  #6 no refills Discontinue simvastatin, start Lipitor 40 mg one by mouth each bedtime #30 and 3 refills Next visit in 2 months, fasting

## 2014-04-10 NOTE — Telephone Encounter (Signed)
Spoke with Pts and gave her results. Medication [facility].

## 2014-04-28 DIAGNOSIS — H2510 Age-related nuclear cataract, unspecified eye: Secondary | ICD-10-CM | POA: Diagnosis not present

## 2014-05-09 ENCOUNTER — Encounter: Payer: Self-pay | Admitting: Pulmonary Disease

## 2014-05-09 ENCOUNTER — Ambulatory Visit (INDEPENDENT_AMBULATORY_CARE_PROVIDER_SITE_OTHER): Payer: Medicare Other | Admitting: Pulmonary Disease

## 2014-05-09 VITALS — BP 136/72 | HR 62 | Temp 97.0°F | Ht 62.5 in | Wt 130.6 lb

## 2014-05-09 DIAGNOSIS — Z23 Encounter for immunization: Secondary | ICD-10-CM | POA: Diagnosis not present

## 2014-05-09 DIAGNOSIS — I2510 Atherosclerotic heart disease of native coronary artery without angina pectoris: Secondary | ICD-10-CM

## 2014-05-09 DIAGNOSIS — J449 Chronic obstructive pulmonary disease, unspecified: Secondary | ICD-10-CM | POA: Diagnosis not present

## 2014-05-09 NOTE — Assessment & Plan Note (Signed)
FLu shot OK to stop spiriva Congratulations on stopping smoking!! Discussed early signs of bronchitis

## 2014-05-09 NOTE — Patient Instructions (Signed)
FLu shot OK to stop spiriva Congratulations on stopping smoking!!

## 2014-05-09 NOTE — Progress Notes (Signed)
   Subjective:    Patient ID: Patricia [name], female    DOB: January 25, 1941, 73 y.o.   MRN: [id number]  HPI  22 yowf for FU of COPD   Significant tests/ events   CT chest 6/09 performed for apical scarring - no nodules  CT chest 11/2012 -emphysema    08/29/2011 PFTs >> ratio 56, FEV1 92%, small airways 26%, no BD response, some air trapping, DLCO 66%  PFTs 12/2013 -FEV1 78%, low ratio   05/09/2014  Chief Complaint  Patient presents with  . Follow-up    Triple Bypass 12/15/2013 hasn't smoked since; no SOB; no chest tightness; no cough   Was finally able to quit after CABG , had tried chantix earlier PFts reviewed Completed cardiac rehab No ER or hospital admits since last ov  Remains active. Lost from 140 last year to 130 lbs now Feels good, with no flare in cough or wheezing   Review of Systems neg for any significant sore throat, dysphagia, itching, sneezing, nasal congestion or excess/ purulent secretions, fever, chills, sweats, unintended wt loss, pleuritic or exertional cp, hempoptysis, orthopnea pnd or change in chronic leg swelling. Also denies presyncope, palpitations, heartburn, abdominal pain, nausea, vomiting, diarrhea or change in bowel or urinary habits, dysuria,hematuria, rash, arthralgias, visual complaints, headache, numbness weakness or ataxia.     Objective:   Physical Exam  Gen. Pleasant, well-nourished, in no distress ENT - no lesions, no post nasal drip Neck: No JVD, no thyromegaly, no carotid bruits Lungs: no use of accessory muscles, no dullness to percussion, clear without rales or rhonchi  Cardiovascular: Rhythm regular, heart sounds  normal, no murmurs or gallops, no peripheral edema Musculoskeletal: No deformities, no cyanosis or clubbing         Assessment & Plan:

## 2014-06-19 ENCOUNTER — Telehealth: Payer: Self-pay | Admitting: Internal Medicine

## 2014-06-19 MED ORDER — CLONAZEPAM 0.5 MG PO TABS: ORAL_TABLET | ORAL | Status: DC

## 2014-06-19 NOTE — Telephone Encounter (Signed)
Pt is requesting refill on Klonopin  Last OV: 04/06/2014 Last Fill: 04/06/2014 # [id number] UDS: 07/13/2013 Low risk   Please advise.

## 2014-06-19 NOTE — Telephone Encounter (Signed)
done

## 2014-06-19 NOTE — Telephone Encounter (Signed)
Pt is needing new rx clonazePAM (KLONOPIN) 0.5 MG tabl, [facility] [address].

## 2014-06-19 NOTE — Telephone Encounter (Signed)
Faxed to [facility].

## 2014-06-19 NOTE — Addendum Note (Signed)
Addended by: Kathlene November E on: 06/19/2014 10:26 AM   Modules accepted: Orders

## 2014-06-27 ENCOUNTER — Ambulatory Visit (INDEPENDENT_AMBULATORY_CARE_PROVIDER_SITE_OTHER): Payer: Medicare Other | Admitting: Internal Medicine

## 2014-06-27 ENCOUNTER — Encounter: Payer: Self-pay | Admitting: Internal Medicine

## 2014-06-27 VITALS — BP 137/75 | HR 62 | Temp 98.1°F | Wt 134.5 lb

## 2014-06-27 DIAGNOSIS — N3000 Acute cystitis without hematuria: Secondary | ICD-10-CM

## 2014-06-27 DIAGNOSIS — I2510 Atherosclerotic heart disease of native coronary artery without angina pectoris: Secondary | ICD-10-CM

## 2014-06-27 DIAGNOSIS — R350 Frequency of micturition: Secondary | ICD-10-CM

## 2014-06-27 DIAGNOSIS — E785 Hyperlipidemia, unspecified: Secondary | ICD-10-CM

## 2014-06-27 DIAGNOSIS — R635 Abnormal weight gain: Secondary | ICD-10-CM

## 2014-06-27 DIAGNOSIS — N390 Urinary tract infection, site not specified: Secondary | ICD-10-CM | POA: Insufficient documentation

## 2014-06-27 LAB — POCT URINALYSIS DIPSTICK
Bilirubin, UA: NEGATIVE
Blood, UA: NEGATIVE
Glucose, UA: NEGATIVE
Ketones, UA: NEGATIVE
NITRITE UA: POSITIVE
PH UA: 5.5
PROTEIN UA: NEGATIVE
Spec Grav, UA: 1.015
Urobilinogen, UA: 2

## 2014-06-27 NOTE — Assessment & Plan Note (Signed)
Based on last FLP, simvastatin was switch to Lipitor as she was not at goal. Plan: Labs

## 2014-06-27 NOTE — Assessment & Plan Note (Addendum)
Urine culture + 03-2014, status post Bactrim, got better and asymptomatic until 10 days ago. Udip + today but not enough for culture  Plan: UA, urine culture, antibiotics if appropriate. No history of recurring or persistent UTIs before

## 2014-06-27 NOTE — Progress Notes (Signed)
Subjective:    Patient ID: Patricia [name], female    DOB: [birth year], 73 y.o.   MRN: [id number]  DOS:  06/27/2014 Type of visit - description : acute visit Interval history: UTI? About 10 days ago developed urinary frequency, used OTC medications without much help, last OTC was 4 days ago. Also, upset about gaining weight, appetite is about the same. She did stop smoking May 2015 after her CABG High cholesterol, medication was changed, see assessment and plan   ROS Denies fever chills No nausea, vomiting, abdominal or flank pain No vaginal discharge, bleeding or a rash  Past Medical History  Diagnosis Date  . Osteopenia   . COPD (chronic obstructive pulmonary disease)   . Hyperlipidemia   . Thyroid nodule   . Abnormal LFTs   . Anxiety and depression   . HTN (hypertension)     intolerant to ACEi (cough)  . Allergic rhinitis   . Panic attacks   . PAD (peripheral artery disease)     Carotid dz as  below, and at [facility] 11/12:  critical stenosis noted just below the sheath site leading into a totally occluded SFA and a patent deep femoral artery  . Carotid artery disease     Moderate - f/u dopplers needed 11/2012  . CAD (coronary artery disease)     s/p CABG  . 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 Coronary angioplasty with stent placement      "1; makes total of 3" (07/05/2012)  . Vaginal hysterectomy  ~ 1976    no oophorectomy  . 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 Coronary artery bypass graft N/A 12/15/2013    Procedure: CORONARY ARTERY BYPASS GRAFTING (CABG);  Surgeon: Gaye Pollack, MD;  Location: [place];  Service: Open Heart Surgery;  Laterality: N/A;  Times 3 using left internal mammary artery and endoscopically harvested right saphenous vein   . Intraoperative transesophageal echocardiogram N/A 12/15/2013    Procedure:  INTRAOPERATIVE TRANSESOPHAGEAL ECHOCARDIOGRAM;  Surgeon: Gaye Pollack, MD;  Location: [facility] OR;  Service: Open Heart Surgery;  Laterality: N/A;    History   Social History  . Marital Status: Widowed    Spouse Name: N/A    Number of Children: 1  . Years of Education: N/A   Occupational History  . Retired-2003    Social History Main Topics  . Smoking status: Former Smoker -- 0.50 packs/day for 54 years    Types: Cigarettes    Quit date: 12/15/2013  . Smokeless tobacco: Never Used     Comment: quit tobacco 12-2013 after CABG  . Alcohol Use: No  . Drug Use: No  . Sexual Activity: No   Other Topics Concern  . Not on file   Social History Narrative   G son lives w/ her         Medication List       This list is accurate as of: 06/27/14  5:50 PM.  Always use your most recent med list.               amLODipine 5 MG tablet  Commonly known as:  NORVASC  Take 1 tablet (5 mg total) by mouth daily.     aspirin 325 MG EC tablet  Take 1 tablet (325 mg total) by mouth  daily.     atorvastatin 40 MG tablet  Commonly known as:  LIPITOR  Take 1 tablet (40 mg total) by mouth at bedtime.     clonazePAM 0.5 MG tablet  Commonly known as:  KLONOPIN  - 1 tab in the morning as neede for anxiety and   - 1.5 or 2 tabs at night as needed for insomnia     furosemide 40 MG tablet  Commonly known as:  LASIX  Take 1 tablet by mouth daily as needed.     KLOR-CON M20 20 MEQ tablet  Generic drug:  potassium chloride SA  Take 1 tablet by mouth daily as needed. Take it along with lasix     losartan 50 MG tablet  Commonly known as:  COZAAR  Take 1 tablet (50 mg total) by mouth daily.     metoprolol tartrate 25 MG tablet  Commonly known as:  LOPRESSOR  TAKE 1 TABLET BY MOUTH TWICE A DAY     tiotropium 18 MCG inhalation capsule  Commonly known as:  SPIRIVA  Place 1 capsule (18 mcg total) into inhaler and inhale daily.     Vitamin D3 1000 UNITS Caps  Take 1,000 Units by mouth  daily.           Objective:   Physical Exam BP 137/75 mmHg  Pulse 62  Temp(Src) 98.1 F (36.7 C) (Oral)  Wt 134 lb 8 oz (61.009 kg)  SpO2 92% General -- alert, well-developed, NAD.  Abdomen-- Not distended, good bowel sounds,soft, non-tender. No CVA TTP Extremities-- no pretibial edema bilaterally  Neurologic--  alert & oriented X3. Speech normal, gait appropriate for age, strength symmetric and appropriate for age.  Psych-- Cognition and judgment appear intact. Cooperative with normal attention span and concentration. No anxious or depressed appearing.        Assessment & Plan:    Weight gain, probably related to tobacco cessation, counseling, diet and a gradual exercise program encouraged

## 2014-06-27 NOTE — Progress Notes (Signed)
Pre visit review using our clinic review tool, if applicable. No additional management support is needed unless otherwise documented below in the visit note. 

## 2014-06-27 NOTE — Patient Instructions (Addendum)
Stop by the front desk and schedule labs to be done within few days (fasting) Will need blood work and a urine sample  Please come back to the office by 08-2013  for a routine check up

## 2014-06-29 ENCOUNTER — Other Ambulatory Visit (INDEPENDENT_AMBULATORY_CARE_PROVIDER_SITE_OTHER): Payer: Medicare Other

## 2014-06-29 DIAGNOSIS — E785 Hyperlipidemia, unspecified: Secondary | ICD-10-CM

## 2014-06-29 DIAGNOSIS — R350 Frequency of micturition: Secondary | ICD-10-CM

## 2014-06-29 LAB — URINALYSIS, ROUTINE W REFLEX MICROSCOPIC
Bilirubin Urine: NEGATIVE
Ketones, ur: NEGATIVE
Nitrite: POSITIVE — AB
SPECIFIC GRAVITY, URINE: 1.025 (ref 1.000–1.030)
TOTAL PROTEIN, URINE-UPE24: NEGATIVE
URINE GLUCOSE: NEGATIVE
UROBILINOGEN UA: 0.2 (ref 0.0–1.0)
pH: 6 (ref 5.0–8.0)

## 2014-06-29 LAB — ALT: ALT: 28 U/L (ref 0–35)

## 2014-06-29 LAB — LIPID PANEL
Cholesterol: 161 mg/dL (ref 0–200)
HDL: 64.5 mg/dL (ref >39.00)
LDL Cholesterol: 83 mg/dL (ref 0–99)
NONHDL: 96.5
Total CHOL/HDL Ratio: 2
Triglycerides: 67 mg/dL (ref 0.0–149.0)
VLDL: 13.4 mg/dL (ref 0.0–40.0)

## 2014-06-29 LAB — AST: AST: 25 U/L (ref 0–37)

## 2014-06-30 ENCOUNTER — Telehealth: Payer: Self-pay | Admitting: Internal Medicine

## 2014-06-30 MED ORDER — CIPROFLOXACIN HCL 250 MG PO TABS: 250.0000 mg | ORAL_TABLET | Freq: Two times a day (BID) | ORAL | Status: DC

## 2014-06-30 NOTE — Telephone Encounter (Signed)
Caller name: Shadie, Sweatman Relation to pt: self  Call back number: [phone]   Reason for call:  Pt inquring about test results please call [phone]

## 2014-06-30 NOTE — Telephone Encounter (Signed)
Spoke with Pt, informed her of urine culture results, awaiting sensitivity results. Instructed her to begin taking Cipro 500 mg 1 tablet by mouth twice daily. Medication sent to [facility] as requested. Pt verbalized understanding.

## 2014-06-30 NOTE — Telephone Encounter (Signed)
Preliminary urine culture showed Escherichia coli. Sensitivity pending. Plan:  Cipro 500 mg one by mouth twice a day #10 no refills. Please let the patient known

## 2014-06-30 NOTE — Telephone Encounter (Signed)
Please advise 

## 2014-07-01 LAB — URINE CULTURE: Colony Count: >=100000

## 2014-07-19 ENCOUNTER — Encounter (HOSPITAL_COMMUNITY): Payer: Self-pay | Admitting: Cardiovascular Disease

## 2014-07-20 ENCOUNTER — Encounter (HOSPITAL_COMMUNITY): Payer: Self-pay | Admitting: Cardiovascular Disease

## 2014-07-28 ENCOUNTER — Other Ambulatory Visit: Payer: Self-pay | Admitting: Internal Medicine

## 2014-08-29 ENCOUNTER — Other Ambulatory Visit: Payer: Self-pay | Admitting: Internal Medicine

## 2014-08-29 ENCOUNTER — Encounter: Payer: Self-pay | Admitting: Internal Medicine

## 2014-08-29 ENCOUNTER — Ambulatory Visit (INDEPENDENT_AMBULATORY_CARE_PROVIDER_SITE_OTHER): Payer: Medicare Other | Admitting: Internal Medicine

## 2014-08-29 VITALS — BP 154/68 | HR 69 | Temp 97.9°F | Ht 63.0 in | Wt 143.1 lb

## 2014-08-29 DIAGNOSIS — I10 Essential (primary) hypertension: Secondary | ICD-10-CM

## 2014-08-29 DIAGNOSIS — R7303 Prediabetes: Secondary | ICD-10-CM

## 2014-08-29 DIAGNOSIS — Z79899 Other long term (current) drug therapy: Secondary | ICD-10-CM | POA: Diagnosis not present

## 2014-08-29 DIAGNOSIS — J449 Chronic obstructive pulmonary disease, unspecified: Secondary | ICD-10-CM | POA: Diagnosis not present

## 2014-08-29 DIAGNOSIS — F419 Anxiety disorder, unspecified: Principal | ICD-10-CM

## 2014-08-29 DIAGNOSIS — F329 Major depressive disorder, single episode, unspecified: Secondary | ICD-10-CM

## 2014-08-29 DIAGNOSIS — F418 Other specified anxiety disorders: Secondary | ICD-10-CM

## 2014-08-29 LAB — BASIC METABOLIC PANEL
BUN: 16 mg/dL (ref 6–23)
CO2: 33 mEq/L — ABNORMAL HIGH (ref 19–32)
CREATININE: 0.57 mg/dL (ref 0.40–1.20)
Calcium: 9.3 mg/dL (ref 8.4–10.5)
Chloride: 106 mEq/L (ref 96–112)
GFR: 110.05 mL/min (ref >60.00)
Glucose, Bld: 75 mg/dL (ref 70–99)
Potassium: 4 mEq/L (ref 3.5–5.1)
Sodium: 140 mEq/L (ref 135–145)

## 2014-08-29 MED ORDER — CLONAZEPAM 0.5 MG PO TABS: ORAL_TABLET | ORAL | Status: DC

## 2014-08-29 NOTE — Assessment & Plan Note (Signed)
Still having some problems sleeping, talked about possibly changing medication but she likes clonazepam. Plan: Continue clonazepam, sleep habits discuss, melatonin

## 2014-08-29 NOTE — Assessment & Plan Note (Signed)
Currently on no inhalers, asymptomatic, she checks O2 sats at home and they are all well within the 90s. Continued tobacco free, still has occasional cravings, patient is counseled

## 2014-08-29 NOTE — Patient Instructions (Signed)
Get your blood work before you leave , you also need a UDS   Continue clonazepam Also melatonin at night   HEALTHY SLEEP Sleep hygiene: Basic rules for a good night's sleep  Sleep only as much as you need to feel rested and then get out of bed  Keep a regular sleep schedule  Avoid forcing sleep  Exercise regularly for at least 20 minutes, preferably 4 to 5 hours before bedtime  Avoid caffeinated beverages after lunch  Avoid alcohol near bedtime: no "night cap"  Avoid smoking, especially in the evening  Do not go to bed hungry  Adjust bedroom environment  Avoid prolonged use of light-emitting screens before bedtime   Deal with your worries before bedtime   \  Please come back to the office in 4- 5 months  for a routine check up   Come back fasting      Check the  blood pressure 2 or 3 times a  Week   Be sure your blood pressure is between 110/65 and  145/85.  if it is consistently higher or lower, let me know

## 2014-08-29 NOTE — Assessment & Plan Note (Signed)
Continue Lopressor, losartan, amlodipine. Lasix and potassium as needed. BP today slightly elevated, recommend to monitor BPs, see instructions

## 2014-08-29 NOTE — Progress Notes (Signed)
Pre visit review using our clinic review tool, if applicable. No additional management support is needed unless otherwise documented below in the visit note. 

## 2014-08-29 NOTE — Assessment & Plan Note (Signed)
Has gained some weight, patient is counseled about diet and exercise

## 2014-08-29 NOTE — Progress Notes (Signed)
Subjective:    Patient ID: Patricia [name], female    DOB: Dec 10, 1940, 74 y.o.   MRN: [id number]  DOS:  08/29/2014 Type of visit - description : rov Interval history: In general feeling well, somehow concerned about her weight. BP today slightly elevated, not ambulatory BPs Insomnia, on clonazepam, falls asleep okay, sometimes wakes up in the middle of the night with vivid dreams. Labs reviewed, due for a BMP History of COPD, on no medications, see assessment and plan Medication reviewed, good compliance.   ROS Denies chest pain or difficulty breathing Doing well with activities of daily living, driving and going to shopping without any problems. Still tobacco free, still has cravings from time to time   Past Medical History  Diagnosis Date  . Osteopenia   . COPD (chronic obstructive pulmonary disease)   . Hyperlipidemia   . Thyroid nodule   . Abnormal LFTs   . Anxiety and depression   . HTN (hypertension)     intolerant to ACEi (cough)  . Allergic rhinitis   . Panic attacks   . PAD (peripheral artery disease)     Carotid dz as  below, and at [facility] 11/12:  critical stenosis noted just below the sheath site leading into a totally occluded SFA and a patent deep femoral artery  . Carotid artery disease     Moderate - f/u dopplers needed 11/2012  . CAD (coronary artery disease)     s/p CABG  . 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 Coronary angioplasty with stent placement      "1; makes total of 3" (07/05/2012)  . Vaginal hysterectomy  ~ 1976    no oophorectomy  . 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 Coronary artery bypass graft N/A 12/15/2013    Procedure: CORONARY ARTERY BYPASS GRAFTING (CABG);  Surgeon: Gaye Pollack, MD;  Location: [place];  Service: Open Heart Surgery;  Laterality: N/A;  Times 3 using left internal mammary  artery and endoscopically harvested right saphenous vein   . Intraoperative transesophageal echocardiogram N/A 12/15/2013    Procedure: INTRAOPERATIVE TRANSESOPHAGEAL ECHOCARDIOGRAM;  Surgeon: Gaye Pollack, MD;  Location: [facility] OR;  Service: Open Heart Surgery;  Laterality: N/A;  . Left heart catheterization with coronary angiogram N/A 07/02/2011    Procedure: LEFT HEART CATHETERIZATION WITH CORONARY ANGIOGRAM;  Surgeon: Sherren Mocha, MD;  Location: [facility] CATH LAB;  Service: Cardiovascular;  Laterality: N/A;  . Percutaneous coronary intervention-balloon only  07/02/2011    Procedure: PERCUTANEOUS CORONARY INTERVENTION-BALLOON ONLY;  Surgeon: Sherren Mocha, MD;  Location: [facility] CATH LAB;  Service: Cardiovascular;;  . Left heart catheterization with coronary angiogram N/A 07/05/2012    Procedure: LEFT HEART CATHETERIZATION WITH CORONARY ANGIOGRAM;  Surgeon: Sherren Mocha, MD;  Location: [facility] CATH LAB;  Service: Cardiovascular;  Laterality: N/A;  . Percutaneous coronary intervention-balloon only  07/05/2012    Procedure: PERCUTANEOUS CORONARY INTERVENTION-BALLOON ONLY;  Surgeon: Sherren Mocha, MD;  Location: [facility] CATH LAB;  Service: Cardiovascular;;  . Left heart catheterization with coronary angiogram N/A 11/28/2013    Procedure: LEFT HEART CATHETERIZATION WITH CORONARY ANGIOGRAM;  Surgeon: Blane Ohara, MD;  Location: [facility] CATH LAB;  Service: Cardiovascular;  Laterality: N/A;    History   Social History  . Marital Status: Widowed    Spouse Name: N/A  Number of Children: 1  . Years of Education: N/A   Occupational History  . Retired-2003    Social History Main Topics  . Smoking status: Former Smoker -- 0.50 packs/day for 54 years    Types: Cigarettes    Quit date: 12/15/2013  . Smokeless tobacco: Never Used     Comment: quit tobacco 12-2013 after CABG  . Alcohol Use: No  . Drug Use: No  . Sexual Activity: No   Other Topics Concern  . Not on file   Social History Narrative   G son  lives w/ her         Medication List       This list is accurate as of: 08/29/14  6:23 PM.  Always use your most recent med list.               amLODipine 5 MG tablet  Commonly known as:  NORVASC  Take 1 tablet (5 mg total) by mouth daily.     aspirin 325 MG EC tablet  Take 1 tablet (325 mg total) by mouth daily.     atorvastatin 40 MG tablet  Commonly known as:  LIPITOR  TAKE 1 TABLET (40 MG TOTAL) BY MOUTH AT BEDTIME.     clonazePAM 0.5 MG tablet  Commonly known as:  KLONOPIN  - 1 tab in the morning as neede for anxiety and   - 1.5 or 2 tabs at night as needed for insomnia     furosemide 40 MG tablet  Commonly known as:  LASIX  Take 1 tablet by mouth daily as needed.     KLOR-CON M20 20 MEQ tablet  Generic drug:  potassium chloride SA  Take 1 tablet by mouth daily as needed. Take it along with lasix     losartan 50 MG tablet  Commonly known as:  COZAAR  Take 1 tablet (50 mg total) by mouth daily.     metoprolol tartrate 25 MG tablet  Commonly known as:  LOPRESSOR  TAKE 1 TABLET BY MOUTH TWICE A DAY     Vitamin D3 1000 UNITS Caps  Take 1,000 Units by mouth daily.           Objective:   Physical Exam BP 154/68 mmHg  Pulse 69  Temp(Src) 97.9 F (36.6 C) (Oral)  Ht 5\' 3"  (1.6 m)  Wt 143 lb 2 oz (64.921 kg)  BMI 25.36 kg/m2  SpO2 95% General -- alert, well-developed, NAD.  Lungs -- normal respiratory effort, no intercostal retractions, no accessory muscle use, and normal breath sounds.  Heart-- normal rate, regular rhythm, no murmur.  Extremities-- no pretibial edema bilaterally  Neurologic--  alert & oriented X3. Speech normal, gait appropriate for age, strength symmetric and appropriate for age.   Psych-- Cognition and judgment appear intact. Cooperative with normal attention span and concentration. No anxious or depressed appearing.        Assessment & Plan:

## 2014-08-31 ENCOUNTER — Other Ambulatory Visit (INDEPENDENT_AMBULATORY_CARE_PROVIDER_SITE_OTHER): Payer: Medicare Other | Admitting: *Deleted

## 2014-08-31 DIAGNOSIS — E785 Hyperlipidemia, unspecified: Secondary | ICD-10-CM

## 2014-08-31 LAB — LIPID PANEL
Cholesterol: 148 mg/dL (ref 0–200)
HDL: 58.7 mg/dL (ref >39.00)
LDL Cholesterol: 71 mg/dL (ref 0–99)
NonHDL: 89.3
TRIGLYCERIDES: 92 mg/dL (ref 0.0–149.0)
Total CHOL/HDL Ratio: 3
VLDL: 18.4 mg/dL (ref 0.0–40.0)

## 2014-08-31 LAB — COMPREHENSIVE METABOLIC PANEL
ALK PHOS: 155 U/L — AB (ref 39–117)
ALT: 16 U/L (ref 0–35)
AST: 19 U/L (ref 0–37)
Albumin: 3.8 g/dL (ref 3.5–5.2)
BILIRUBIN TOTAL: 0.4 mg/dL (ref 0.2–1.2)
BUN: 15 mg/dL (ref 6–23)
CALCIUM: 9.3 mg/dL (ref 8.4–10.5)
CO2: 29 meq/L (ref 19–32)
Chloride: 105 mEq/L (ref 96–112)
Creatinine, Ser: 0.57 mg/dL (ref 0.40–1.20)
GFR: 110.05 mL/min (ref >60.00)
Glucose, Bld: 99 mg/dL (ref 70–99)
Potassium: 3.6 mEq/L (ref 3.5–5.1)
SODIUM: 141 meq/L (ref 135–145)
Total Protein: 6.4 g/dL (ref 6.0–8.3)

## 2014-08-31 NOTE — Addendum Note (Signed)
Addended by: Eulis Foster on: 08/31/2014 09:11 AM   Modules accepted: Orders

## 2014-09-01 ENCOUNTER — Other Ambulatory Visit: Payer: Medicare Other

## 2014-09-06 ENCOUNTER — Encounter: Payer: Self-pay | Admitting: Cardiovascular Disease

## 2014-09-06 ENCOUNTER — Ambulatory Visit (INDEPENDENT_AMBULATORY_CARE_PROVIDER_SITE_OTHER): Payer: Medicare Other | Admitting: Cardiovascular Disease

## 2014-09-06 VITALS — BP 142/72 | HR 71 | Ht 63.0 in | Wt 142.0 lb

## 2014-09-06 DIAGNOSIS — I6523 Occlusion and stenosis of bilateral carotid arteries: Secondary | ICD-10-CM | POA: Diagnosis not present

## 2014-09-06 DIAGNOSIS — I10 Essential (primary) hypertension: Secondary | ICD-10-CM

## 2014-09-06 MED ORDER — LOSARTAN POTASSIUM 100 MG PO TABS
100.0000 mg | ORAL_TABLET | Freq: Every day | ORAL | Status: DC
Start: 2014-09-06 — End: 2015-10-16

## 2014-09-06 NOTE — Progress Notes (Signed)
Cardiology Office Note   Date:  09/06/2014   ID:  Patricia [name], Patricia [name] [birth year], MRN [id number]  PCP:  Kathlene November, MD  Cardiologist:  Sherren Mocha, MD    Chief Complaint  Patient presents with  . Coronary Artery Disease     History of Present Illness: Patricia [name] is a 74 y.o. female who presents for follow-up of coronary artery disease. The patient has a long history of CAD and has undergone multiple PCI procedures. 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 She ultimately underwent multivessel CABG in May 2015.  She reports no chest pain or pressure. She has not had to take NTG since CABG. Complaints include generalized fatigue and shortness of breath with exertion. She has to stop and rest during grocery shopping because of exertional dyspnea. She denies orthopnea, PND, or leg swelling.   Past Medical History  Diagnosis Date  . Osteopenia   . COPD (chronic obstructive pulmonary disease)   . Hyperlipidemia   . Thyroid nodule   . Abnormal LFTs   . Anxiety and depression   . HTN (hypertension)     intolerant to ACEi (cough)  . Allergic rhinitis   . Panic attacks   . PAD (peripheral artery disease)     Carotid dz as  below, and at [facility] 11/12:  critical stenosis noted just below the sheath site leading into a totally occluded SFA and a patent deep femoral artery  . Carotid artery disease     Moderate - f/u dopplers needed 11/2012  . CAD (coronary artery disease)     s/p CABG  . 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 Coronary angioplasty with stent placement      "1; makes total of 3" (07/05/2012)  . Vaginal hysterectomy  ~ 1976    no oophorectomy  . 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 Coronary artery bypass graft  N/A 12/15/2013    Procedure: CORONARY ARTERY BYPASS GRAFTING (CABG);  Surgeon: Gaye Pollack, MD;  Location: [place];  Service: Open Heart Surgery;  Laterality: N/A;  Times 3 using left internal mammary artery and endoscopically harvested right saphenous vein   . Intraoperative transesophageal echocardiogram N/A 12/15/2013    Procedure: INTRAOPERATIVE TRANSESOPHAGEAL ECHOCARDIOGRAM;  Surgeon: Gaye Pollack, MD;  Location: [facility] OR;  Service: Open Heart Surgery;  Laterality: N/A;  . Left heart catheterization with coronary angiogram N/A 07/02/2011    Procedure: LEFT HEART CATHETERIZATION WITH CORONARY ANGIOGRAM;  Surgeon: Sherren Mocha, MD;  Location: [facility] CATH LAB;  Service: Cardiovascular;  Laterality: N/A;  . Percutaneous coronary intervention-balloon only  07/02/2011    Procedure: PERCUTANEOUS CORONARY INTERVENTION-BALLOON ONLY;  Surgeon: Sherren Mocha, MD;  Location: [facility] CATH LAB;  Service: Cardiovascular;;  . Left heart catheterization with coronary angiogram N/A 07/05/2012    Procedure: LEFT HEART CATHETERIZATION WITH CORONARY ANGIOGRAM;  Surgeon: Sherren Mocha, MD;  Location: [facility] CATH LAB;  Service: Cardiovascular;  Laterality: N/A;  . Percutaneous coronary intervention-balloon only  07/05/2012    Procedure: PERCUTANEOUS CORONARY INTERVENTION-BALLOON ONLY;  Surgeon: Sherren Mocha, MD;  Location: [facility] CATH LAB;  Service: Cardiovascular;;  . Left heart catheterization with coronary angiogram N/A 11/28/2013    Procedure: LEFT HEART CATHETERIZATION WITH CORONARY ANGIOGRAM;  Surgeon: Blane Ohara, MD;  Location: [facility] CATH LAB;  Service: Cardiovascular;  Laterality: N/A;    Current Outpatient Prescriptions  Medication Sig Dispense Refill  . amLODipine (NORVASC) 5 MG tablet Take 1 tablet (5 mg total) by mouth daily. 30 tablet 6  . aspirin EC 325 MG EC tablet Take 1 tablet (325 mg total) by mouth daily.  0  . atorvastatin (LIPITOR) 40 MG tablet TAKE 1 TABLET (40 MG TOTAL) BY MOUTH AT BEDTIME. 30 tablet 3    . Cholecalciferol (VITAMIN D3) 1000 UNITS CAPS Take 1,000 Units by mouth daily.    . clonazePAM (KLONOPIN) 0.5 MG tablet 1 tab in the morning as neede for anxiety and  1.5 or 2 tabs at night as needed for insomnia 60 tablet 3  . furosemide (LASIX) 40 MG tablet Take 1 tablet by mouth daily as needed.    Marland Kitchen KLOR-CON M20 20 MEQ tablet Take 1 tablet by mouth daily as needed. Take it along with lasix    . losartan (COZAAR) 100 MG tablet Take 1 tablet (100 mg total) by mouth daily. 90 tablet 3  . metoprolol tartrate (LOPRESSOR) 25 MG tablet TAKE 1 TABLET BY MOUTH TWICE A DAY 60 tablet 6   No current facility-administered medications for this visit.    Allergies:   Ace inhibitors   Social History:  The patient  reports that she quit smoking about 8 months ago. Her smoking use included Cigarettes. She has a 27 pack-year smoking history. She has never used smokeless tobacco. She reports that she does not drink alcohol or use illicit drugs.   Family History:  The patient's family history includes Coronary artery disease in her father; Heart attack in her brother and father; Hypertension in her mother. There is no history of Stroke, Colon cancer, or Breast cancer.    ROS:  Please see the history of present illness.  Otherwise, review of systems is positive for DOE, fatigue.  All other systems are reviewed and negative.   PHYSICAL EXAM: VS:  BP 142/72 mmHg  Pulse 71  Ht 5\' 3"  (1.6 m)  Wt 142 lb (64.411 kg)  BMI 25.16 kg/m2  SpO2 97% , BMI Body mass index is 25.16 kg/(m^2).   GEN: Well nourished, well developed, in no acute distress HEENT: normal Neck: no JVD  or masses, positive for right carotid bruit Cardiac: RRR; no murmurs, rubs, or gallops, no edema  Respiratory:  clear to auscultation bilaterally, normal work of breathing, somewhat diminished air movement especially with expiration GI: soft, nontender, nondistended, + BS MS: no deformity or atrophy Skin: warm and dry, no rash Neuro:   Strength and sensation are intact Psych: euthymic mood, full affect  EKG:  EKG is not ordered today.  Recent Labs: 12/16/2013: Magnesium 2.4 04/06/2014: Hemoglobin 14.9; Platelets 229.0; TSH 1.36 08/31/2014: ALT 16; BUN 15; Creatinine 0.57; Potassium 3.6; Sodium 141   Lipid Panel     Component Value Date/Time   CHOL 148 08/31/2014 0911   TRIG 92.0 08/31/2014 0911   HDL 58.70 08/31/2014 0911   CHOLHDL 3 08/31/2014 0911   VLDL 18.4 08/31/2014 0911   LDLCALC 71 08/31/2014 0911   LDLDIRECT 156.0 04/26/2007 0936      Wt Readings from Last 3 Encounters:  09/06/14 142 lb (64.411 kg)  08/29/14 143 lb 2 oz (64.921 kg)  06/27/14 134 lb 8 oz (61.009 kg)    ASSESSMENT AND PLAN: 1.  Coronary artery disease, native vessel. The patient is stable without angina  since her bypass surgery. Medications were reviewed and are appropriate with aspirin, atorvastatin, and the beta blocker. Next  2. Essential hypertension, uncontrolled. Home blood pressure readings are in the 150s. She should continue amlodipine and metoprolol. I asked her to increase losartan to 100 mg daily.  3. Hyperlipidemia. The patient takes atorvastatin 40 mg. Her lipids are excellent. Recent labs reviewed and she was given a copy.  4. Carotid stenosis. Moderate bilateral disease noted. Her right carotid room he is likely related to external carotid artery stenosis as she has had significant velocity elevations on duplex imaging of that vessel. Recommend a repeat carotid study before she returns in 6 months.  Current medicines are reviewed with the patient today.  The patient does not have concerns regarding medicines.  The following changes have been made:    Increase losartan to 100 mg daily  Labs/ tests ordered today include:  Carotid duplex in 6 months   Orders Placed This Encounter  Procedures  . Carotid duplex   Disposition:   FU with me in 6 months after the carotid duplex study is completed  Signed, Sherren Mocha, MD  09/06/2014 5:35 PM    [facility] [address], [place], [place]  [address] Phone: [phone]; Fax: [phone]

## 2014-09-06 NOTE — Patient Instructions (Signed)
Your physician has recommended you make the following change in your medication:  1. INCREASE Losartan to 100mg  take one by mouth daily  Your physician has requested that you have a carotid duplex in 6 MONTHS. This test is an ultrasound of the carotid arteries in your neck. It looks at blood flow through these arteries that supply the brain with blood. Allow one hour for this exam. There are no restrictions or special instructions.  Your physician wants you to follow-up in: 6 MONTHS with Dr Burt Knack.  You will receive a reminder letter in the mail two months in advance. If you don't receive a letter, please call our office to schedule the follow-up appointment.

## 2014-09-08 ENCOUNTER — Other Ambulatory Visit: Payer: Self-pay

## 2014-09-08 ENCOUNTER — Telehealth: Payer: Self-pay

## 2014-09-08 MED ORDER — AMLODIPINE BESYLATE 5 MG PO TABS: 5.0000 mg | ORAL_TABLET | Freq: Every day | ORAL | Status: DC

## 2014-09-08 NOTE — Telephone Encounter (Signed)
UDS: 08/29/2014   Negative for Clonazepam: PRN   Low risk per Dr. Larose Kells 09/08/2014

## 2014-09-16 ENCOUNTER — Ambulatory Visit (INDEPENDENT_AMBULATORY_CARE_PROVIDER_SITE_OTHER): Payer: Medicare Other

## 2014-09-16 ENCOUNTER — Ambulatory Visit (INDEPENDENT_AMBULATORY_CARE_PROVIDER_SITE_OTHER): Payer: Medicare Other | Admitting: Family Medicine

## 2014-09-16 VITALS — BP 120/80 | HR 74 | Temp 98.2°F | Resp 16 | Ht 63.0 in | Wt 143.0 lb

## 2014-09-16 DIAGNOSIS — M545 Low back pain, unspecified: Secondary | ICD-10-CM

## 2014-09-16 DIAGNOSIS — I6523 Occlusion and stenosis of bilateral carotid arteries: Secondary | ICD-10-CM

## 2014-09-16 DIAGNOSIS — M549 Dorsalgia, unspecified: Secondary | ICD-10-CM

## 2014-09-16 DIAGNOSIS — R350 Frequency of micturition: Secondary | ICD-10-CM | POA: Diagnosis not present

## 2014-09-16 DIAGNOSIS — M40209 Unspecified kyphosis, site unspecified: Secondary | ICD-10-CM

## 2014-09-16 DIAGNOSIS — N3090 Cystitis, unspecified without hematuria: Secondary | ICD-10-CM

## 2014-09-16 LAB — POCT UA - MICROSCOPIC ONLY
CASTS, UR, LPF, POC: NEGATIVE
Crystals, Ur, HPF, POC: NEGATIVE
Mucus, UA: NEGATIVE
RBC, urine, microscopic: NEGATIVE
YEAST UA: NEGATIVE

## 2014-09-16 LAB — POCT URINALYSIS DIPSTICK
BILIRUBIN UA: NEGATIVE
Glucose, UA: NEGATIVE
Ketones, UA: NEGATIVE
Nitrite, UA: POSITIVE
Protein, UA: NEGATIVE
RBC UA: NEGATIVE
Spec Grav, UA: 1.015
Urobilinogen, UA: 0.2
pH, UA: 5.5

## 2014-09-16 MED ORDER — HYDROCODONE-ACETAMINOPHEN 5-325 MG PO TABS: 1.0000 | ORAL_TABLET | Freq: Four times a day (QID) | ORAL | Status: DC | PRN

## 2014-09-16 MED ORDER — CEPHALEXIN 500 MG PO CAPS: 500.0000 mg | ORAL_CAPSULE | Freq: Two times a day (BID) | ORAL | Status: DC

## 2014-09-16 NOTE — Patient Instructions (Signed)
You may have a pinched nerve in the back or possible compression fracture.  Ok to continue heat or ice to affected area, hydrocodone if needed for pain (lowest effective dose, and this can make you sleepy and dizzy/risk of falling so be careful taking this). Gentle range of motion, rest, and recheck in the next week if not improving - sooner if worse.   Start keflex for urinary infection. Return to the clinic or go to the nearest emergency room if any of your symptoms worsen or new symptoms occur.  Urinary Tract Infection Urinary tract infections (UTIs) can develop anywhere along your urinary tract. Your urinary tract is your body's drainage system for removing wastes and extra water. Your urinary tract includes two kidneys, two ureters, a bladder, and a urethra. Your kidneys are a pair of bean-shaped organs. Each kidney is about the size of your fist. They are located below your ribs, one on each side of your spine. CAUSES Infections are caused by microbes, which are microscopic organisms, including fungi, viruses, and bacteria. These organisms are so small that they can only be seen through a microscope. Bacteria are the microbes that most commonly cause UTIs. SYMPTOMS  Symptoms of UTIs may vary by age and gender of the patient and by the location of the infection. Symptoms in young women typically include a frequent and intense urge to urinate and a painful, burning feeling in the bladder or urethra during urination. Older women and men are more likely to be tired, shaky, and weak and have muscle aches and abdominal pain. A fever may mean the infection is in your kidneys. Other symptoms of a kidney infection include pain in your back or sides below the ribs, nausea, and vomiting. DIAGNOSIS To diagnose a UTI, your caregiver will ask you about your symptoms. Your caregiver also will ask to provide a urine sample. The urine sample will be tested for bacteria and white blood cells. White blood cells are  made by your body to help fight infection. TREATMENT  Typically, UTIs can be treated with medication. Because most UTIs are caused by a bacterial infection, they usually can be treated with the use of antibiotics. The choice of antibiotic and length of treatment depend on your symptoms and the type of bacteria causing your infection. HOME CARE INSTRUCTIONS  If you were prescribed antibiotics, take them exactly as your caregiver instructs you. Finish the medication even if you feel better after you have only taken some of the medication.  Drink enough water and fluids to keep your urine clear or pale yellow.  Avoid caffeine, tea, and carbonated beverages. They tend to irritate your bladder.  Empty your bladder often. Avoid holding urine for long periods of time.  Empty your bladder before and after sexual intercourse.  After a bowel movement, women should cleanse from front to back. Use each tissue only once. SEEK MEDICAL CARE IF:   You have back pain.  You develop a fever.  Your symptoms do not begin to resolve within 3 days. SEEK IMMEDIATE MEDICAL CARE IF:   You have severe back pain or lower abdominal pain.  You develop chills.  You have nausea or vomiting.  You have continued burning or discomfort with urination. MAKE SURE YOU:   Understand these instructions.  Will watch your condition.  Will get help right away if you are not doing well or get worse. Document Released: 05/07/2005 Document Revised: 01/27/2012 Document Reviewed: 09/05/2011 [facility] Patient Information 2015 [place], [place]. This information is  not intended to replace advice given to you by your health care provider. Make sure you discuss any questions you have with your health care provider.    Back Pain, Adult Low back pain is very common. About 1 in 5 people have back pain.The cause of low back pain is rarely dangerous. The pain often gets better over time.About half of people with a sudden onset of  back pain feel better in just 2 weeks. About 8 in 10 people feel better by 6 weeks.  CAUSES Some common causes of back pain include:  Strain of the muscles or ligaments supporting the spine.  Wear and tear (degeneration) of the spinal discs.  Arthritis.  Direct injury to the back. DIAGNOSIS Most of the time, the direct cause of low back pain is not known.However, back pain can be treated effectively even when the exact cause of the pain is unknown.Answering your caregiver's questions about your overall health and symptoms is one of the most accurate ways to make sure the cause of your pain is not dangerous. If your caregiver needs more information, he or she may order lab work or imaging tests (X-rays or MRIs).However, even if imaging tests show changes in your back, this usually does not require surgery. HOME CARE INSTRUCTIONS For many people, back pain returns.Since low back pain is rarely dangerous, it is often a condition that people can learn to [facility] their own.   Remain active. It is stressful on the back to sit or stand in one place. Do not sit, drive, or stand in one place for more than 30 minutes at a time. Take short walks on level surfaces as soon as pain allows.Try to increase the length of time you walk each day.  Do not stay in bed.Resting more than 1 or 2 days can delay your recovery.  Do not avoid exercise or work.Your body is made to move.It is not dangerous to be active, even though your back may hurt.Your back will likely heal faster if you return to being active before your pain is gone.  Pay attention to your body when you bend and lift. Many people have less discomfortwhen lifting if they bend their knees, keep the load close to their bodies,and avoid twisting. Often, the most comfortable positions are those that put less stress on your recovering back.  Find a comfortable position to sleep. Use a firm mattress and lie on your side with your knees  slightly bent. If you lie on your back, put a pillow under your knees.  Only take over-the-counter or prescription medicines as directed by your caregiver. Over-the-counter medicines to reduce pain and inflammation are often the most helpful.Your caregiver may prescribe muscle relaxant drugs.These medicines help dull your pain so you can more quickly return to your normal activities and healthy exercise.  Put ice on the injured area.  Put ice in a plastic bag.  Place a towel between your skin and the bag.  Leave the ice on for 15-20 minutes, 03-04 times a day for the first 2 to 3 days. After that, ice and heat may be alternated to reduce pain and spasms.  Ask your caregiver about trying back exercises and gentle massage. This may be of some benefit.  Avoid feeling anxious or stressed.Stress increases muscle tension and can worsen back pain.It is important to recognize when you are anxious or stressed and learn ways to manage it.Exercise is a great option. SEEK MEDICAL CARE IF:  You have pain that is  not relieved with rest or medicine.  You have pain that does not improve in 1 week.  You have new symptoms.  You are generally not feeling well. SEEK IMMEDIATE MEDICAL CARE IF:   You have pain that radiates from your back into your legs.  You develop new bowel or bladder control problems.  You have unusual weakness or numbness in your arms or legs.  You develop nausea or vomiting.  You develop abdominal pain.  You feel faint. Document Released: 07/28/2005 Document Revised: 01/27/2012 Document Reviewed: 11/29/2013 [facility] Patient Information 2015 [place], [place]. This information is not intended to replace advice given to you by your health care provider. Make sure you discuss any questions you have with your health care provider.

## 2014-09-16 NOTE — Progress Notes (Addendum)
Subjective:  This chart was scribed for Patricia Ray, MD, by Starleen Arms, Medical Scribe. This patient was seen in room Rm 11 and the patient's care was started at 2:45 PM.   Patient ID: Primitivo Gauze, female    DOB: Jan 07, 1941, 74 y.o.   MRN: [id number]  HPI Lithzy Bernard is a 74 y.o. female with mlutiple medical problems per chart review including CAD, HLD, HTN, PAD, prior tobacco use.  She has a history of osteopenia and reports her last bone density test in 11/2012; lowest T-score was -1.7.  Plan on repeat testing in 2-3 years.  Patient was continued on vitamin D supplementation.    She presents to [facility] complaining of left-sided mid-back pain onset acutely two days ago when she bent over to towel off her legs.  Patient describes the pain onset as feeling "like someone had hit her".  Patient has taken Aleve, ASA, Icy/hot patch, and salonpas patch without relief.  She denies other treatments.  Patient denies history of back fx, other back complaints.  Patient also complains of increased frequency with associated cloudy urine.  Patient denies previous kidney infection.  Patient denies hematuria, fever.     PCP: Kathlene November, MD  Patient Active Problem List   Diagnosis Date Noted  . UTI (urinary tract infection) 06/27/2014  . Prediabetes 01/25/2014  . Atrial fibrillation 12/26/2013  . Acute blood loss anemia 12/26/2013  . Bronchitis 12/26/2013  . Fluid overload 12/26/2013  . 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 Medicare annual wellness visit, subsequent 08/08/2011  . PAD (peripheral artery disease) 07/21/2011  . OTHER DYSPNEA AND RESPIRATORY ABNORMALITIES 07/02/2010  . Coronary atherosclerosis of native coronary artery 07/02/2010  . ALLERGIC RHINITIS 10/16/2009  . Essential hypertension 07/20/2009  . Anxiety,depression, insomnia 05/18/2009  . SKIN LESION 01/12/2008  . THYROID NODULE 04/26/2007  . TOBACCO ABUSE 04/26/2007  . Hyperlipidemia 01/25/2007  . CAROTID ARTERY DISEASE 01/25/2007  .  COPD (chronic obstructive pulmonary disease) 01/25/2007  . OSTEOPENIA 01/25/2007   Past Medical History  Diagnosis Date  . Osteopenia   . COPD (chronic obstructive pulmonary disease)   . Hyperlipidemia   . Thyroid nodule   . Abnormal LFTs   . Anxiety and depression   . HTN (hypertension)     intolerant to ACEi (cough)  . Allergic rhinitis   . Panic attacks   . PAD (peripheral artery disease)     Carotid dz as  below, and at [facility] 11/12:  critical stenosis noted just below the sheath site leading into a totally occluded SFA and a patent deep femoral artery  . Carotid artery disease     Moderate - f/u dopplers needed 11/2012  . CAD (coronary artery disease)     s/p CABG  . 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 Coronary angioplasty with stent placement      "1; makes total of 3" (07/05/2012)  . Vaginal hysterectomy  ~ 1976    no oophorectomy  . 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 Coronary artery bypass graft N/A 12/15/2013    Procedure: CORONARY ARTERY BYPASS GRAFTING (CABG);  Surgeon: Gaye Pollack, MD;  Location: [place];  Service: Open Heart Surgery;  Laterality: N/A;  Times 3 using left internal mammary artery and endoscopically harvested right saphenous vein   .  Intraoperative transesophageal echocardiogram N/A 12/15/2013    Procedure: INTRAOPERATIVE TRANSESOPHAGEAL ECHOCARDIOGRAM;  Surgeon: Gaye Pollack, MD;  Location: [facility] OR;  Service: Open Heart Surgery;  Laterality: N/A;  . Left heart catheterization with coronary angiogram N/A 07/02/2011    Procedure: LEFT HEART CATHETERIZATION WITH CORONARY ANGIOGRAM;  Surgeon: Sherren Mocha, MD;  Location[facility] C[facility] LAB;  Service: Cardiovascular;  Laterality: N/A;  . Percutaneous coronary intervention-balloon only  07/02/2011    Procedure: PERCUTANEOUS CORONARY INTERVENTION-BALLOON ONLY;  Surgeon:  Sherren Mocha, MD;  Location: [facility] CATH LAB;  Service: Cardiovascular;;  . Left heart catheterization with coronary angiogram N/A 07/05/2012    Procedure: LEFT HEART CATHETERIZATION WITH CORONARY ANGIOGRAM;  Surgeon: Sherren Mocha, MD;  Location: [facility] CATH LAB;  Service: Cardiovascular;  Laterality: N/A;  . Percutaneous coronary intervention-balloon only  07/05/2012    Procedure: PERCUTANEOUS CORONARY INTERVENTION-BALLOON ONLY;  Surgeon: Sherren Mocha, MD;  Location: [facility] CATH LAB;  Service: Cardiovascular;;  . Left heart catheterization with coronary angiogram N/A 11/28/2013    Procedure: LEFT HEART CATHETERIZATION WITH CORONARY ANGIOGRAM;  Surgeon: Blane Ohara, MD;  Location: [facility] CATH LAB;  Service: Cardiovascular;  Laterality: N/A;   Allergies  Allergen Reactions  . Ace Inhibitors     cough   Prior to Admission medications   Medication Sig Start Date End Date Taking? Authorizing Provider  amLODipine (NORVASC) 5 MG tablet Take 1 tablet (5 mg total) by mouth daily. 09/08/14  Yes Colon Branch, MD  aspirin EC 325 MG EC tablet Take 1 tablet (325 mg total) by mouth daily. 12/23/13  Yes Wayne E Gold, PA-C  atorvastatin (LIPITOR) 40 MG tablet TAKE 1 TABLET (40 MG TOTAL) BY MOUTH AT BEDTIME. 07/28/14  Yes Colon Branch, MD  Cholecalciferol (VITAMIN D3) 1000 UNITS CAPS Take 1,000 Units by mouth daily.   Yes Historical Provider, MD  clonazePAM (KLONOPIN) 0.5 MG tablet 1 tab in the morning as neede for anxiety and  1.5 or 2 tabs at night as needed for insomnia 08/29/14  Yes Colon Branch, MD  furosemide (LASIX) 40 MG tablet Take 1 tablet by mouth daily as needed. 02/07/14  Yes Historical Provider, MD  KLOR-CON M20 20 MEQ tablet Take 1 tablet by mouth daily as needed. Take it along with lasix 02/07/14  Yes Historical Provider, MD  losartan (COZAAR) 100 MG tablet Take 1 tablet (100 mg total) by mouth daily. 09/06/14  Yes Blane Ohara, MD  metoprolol tartrate (LOPRESSOR) 25 MG tablet TAKE 1 TABLET BY MOUTH TWICE A  DAY 08/29/14  Yes Colon Branch, MD     Review of Systems  Genitourinary: Positive for frequency. Negative for hematuria.       Objective:   Filed Vitals:   09/16/14 1428  BP: 120/80  Pulse: 74  Temp: 98.2 F (36.8 C)  Resp: 16    Physical Exam  Constitutional: She is oriented to person, place, and time. She appears well-developed and well-nourished. No distress.  HENT:  Head: Normocephalic and atraumatic.  Eyes: Conjunctivae and EOM are normal.  Neck: Neck supple. No tracheal deviation present.  Cardiovascular: Normal rate and regular rhythm.  Exam reveals no gallop and no friction rub.   No murmur heard. Pulmonary/Chest: Effort normal. No respiratory distress.  Lungs CTA  Abdominal: Soft. She exhibits no distension. There is no tenderness.  Genitourinary:  No CVA tenderness  Musculoskeletal: Normal range of motion.  No midline bony tenderness of thoracic spine.  Pain over paraspinal muscles on left mid-back just  below the scapula. Negative SLR bilaterally. Equal upper and lower extremity strength.    Neurological: She is alert and oriented to person, place, and time.  Skin: Skin is warm and dry.  No rash.  No topical or superficial pain on palpation of skin.   Psychiatric: She has a normal mood and affect. Her behavior is normal.  Nursing note and vitals reviewed.   Results for orders placed or performed in visit on 09/16/14  POCT UA - Microscopic Only  Result Value Ref Range   WBC, Ur, HPF, POC 10-15    RBC, urine, microscopic neg    Bacteria, U Microscopic 4+    Mucus, UA neg    Epithelial cells, urine per micros 4-8    Crystals, Ur, HPF, POC neg    Casts, Ur, LPF, POC neg    Yeast, UA neg    Renal tubular cells 1-2   POCT urinalysis dipstick  Result Value Ref Range   Color, UA yellow    Clarity, UA clear    Glucose, UA neg    Bilirubin, UA neg    Ketones, UA neg    Spec Grav, UA 1.015    Blood, UA neg    pH, UA 5.5    Protein, UA neg    Urobilinogen, UA  0.2    Nitrite, UA positive    Leukocytes, UA moderate (2+)    [facility] reading (PRIMARY) by  Dr. Merri [name]: T-spine: kyphosis with questionable wedge deformity of approximately T-8 with slight narrowing anteriorly     Assessment & Plan:   Aneesah Hernan is a 74 y.o. female Mid back pain on left side , Kyphosis, unspecified kyphosis type, unspecified spinal region  - Plan: DG Thoracic Spine 2 View, HYDROcodone-acetaminophen (NORCO/VICODIN) 5-325 MG per tablet  -mid back paraspinal strain vs. Small compression fracture with prior low bone mass. XR sent for overread, rx hydrocodone if needed for pain - SED and use lowest effective dose.  If compression fx noted, would discuss other imaging/repeat bone scan or meds with PCP.   -heat or ice and gentle rom ok. rtc precautions discussed.   Urine frequency -Cystitis - Plan: Urine culture, cephALEXin (KEFLEX) 500 MG capsule  - early UTI. Start keflex, check urine cx. rtc precautions.   Meds ordered this encounter  Medications  . HYDROcodone-acetaminophen (NORCO/VICODIN) 5-325 MG per tablet    Sig: Take 1 tablet by mouth every 6 (six) hours as needed for moderate pain.    Dispense:  15 tablet    Refill:  0  . cephALEXin (KEFLEX) 500 MG capsule    Sig: Take 1 capsule (500 mg total) by mouth 2 (two) times daily.    Dispense:  20 capsule    Refill:  0   Patient Instructions  You may have a pinched nerve in the back or possible compression fracture.  Ok to continue heat or ice to affected area, hydrocodone if needed for pain (lowest effective dose, and this can make you sleepy and dizzy/risk of falling so be careful taking this). Gentle range of motion, rest, and recheck in the next week if not improving - sooner if worse.   Start keflex for urinary infection. Return to the clinic or go to the nearest emergency room if any of your symptoms worsen or new symptoms occur.  Urinary Tract Infection Urinary tract infections (UTIs) can develop  anywhere along your urinary tract. Your urinary tract is your body's drainage system for removing wastes and extra water. Your  urinary tract includes two kidneys, two ureters, a bladder, and a urethra. Your kidneys are a pair of bean-shaped organs. Each kidney is about the size of your fist. They are located below your ribs, one on each side of your spine. CAUSES Infections are caused by microbes, which are microscopic organisms, including fungi, viruses, and bacteria. These organisms are so small that they can only be seen through a microscope. Bacteria are the microbes that most commonly cause UTIs. SYMPTOMS  Symptoms of UTIs may vary by age and gender of the patient and by the location of the infection. Symptoms in young women typically include a frequent and intense urge to urinate and a painful, burning feeling in the bladder or urethra during urination. Older women and men are more likely to be tired, shaky, and weak and have muscle aches and abdominal pain. A fever may mean the infection is in your kidneys. Other symptoms of a kidney infection include pain in your back or sides below the ribs, nausea, and vomiting. DIAGNOSIS To diagnose a UTI, your caregiver will ask you about your symptoms. Your caregiver also will ask to provide a urine sample. The urine sample will be tested for bacteria and white blood cells. White blood cells are made by your body to help fight infection. TREATMENT  Typically, UTIs can be treated with medication. Because most UTIs are caused by a bacterial infection, they usually can be treated with the use of antibiotics. The choice of antibiotic and length of treatment depend on your symptoms and the type of bacteria causing your infection. HOME CARE INSTRUCTIONS  If you were prescribed antibiotics, take them exactly as your caregiver instructs you. Finish the medication even if you feel better after you have only taken some of the medication.  Drink enough water and  fluids to keep your urine clear or pale yellow.  Avoid caffeine, tea, and carbonated beverages. They tend to irritate your bladder.  Empty your bladder often. Avoid holding urine for long periods of time.  Empty your bladder before and after sexual intercourse.  After a bowel movement, women should cleanse from front to back. Use each tissue only once. SEEK MEDICAL CARE IF:   You have back pain.  You develop a fever.  Your symptoms do not begin to resolve within 3 days. SEEK IMMEDIATE MEDICAL CARE IF:   You have severe back pain or lower abdominal pain.  You develop chills.  You have nausea or vomiting.  You have continued burning or discomfort with urination. MAKE SURE YOU:   Understand these instructions.  Will watch your condition.  Will get help right away if you are not doing well or get worse. Document Released: 05/07/2005 Document Revised: 01/27/2012 Document Reviewed: 09/05/2011 [facility] Patient Information 2015 [place], [place]. This information is not intended to replace advice given to you by your health care provider. Make sure you discuss any questions you have with your health care provider.    Back Pain, Adult Low back pain is very common. About 1 in 5 people have back pain.The cause of low back pain is rarely dangerous. The pain often gets better over time.About half of people with a sudden onset of back pain feel better in just 2 weeks. About 8 in 10 people feel better by 6 weeks.  CAUSES Some common causes of back pain include:  Strain of the muscles or ligaments supporting the spine.  Wear and tear (degeneration) of the spinal discs.  Arthritis.  Direct injury to the  back. DIAGNOSIS Most of the time, the direct cause of low back pain is not known.However, back pain can be treated effectively even when the exact cause of the pain is unknown.Answering your caregiver's questions about your overall health and symptoms is one of the most accurate  ways to make sure the cause of your pain is not dangerous. If your caregiver needs more information, he or she may order lab work or imaging tests (X-rays or MRIs).However, even if imaging tests show changes in your back, this usually does not require surgery. HOME CARE INSTRUCTIONS For many people, back pain returns.Since low back pain is rarely dangerous, it is often a condition that people can learn to [facility] their own.   Remain active. It is stressful on the back to sit or stand in one place. Do not sit, drive, or stand in one place for more than 30 minutes at a time. Take short walks on level surfaces as soon as pain allows.Try to increase the length of time you walk each day.  Do not stay in bed.Resting more than 1 or 2 days can delay your recovery.  Do not avoid exercise or work.Your body is made to move.It is not dangerous to be active, even though your back may hurt.Your back will likely heal faster if you return to being active before your pain is gone.  Pay attention to your body when you bend and lift. Many people have less discomfortwhen lifting if they bend their knees, keep the load close to their bodies,and avoid twisting. Often, the most comfortable positions are those that put less stress on your recovering back.  Find a comfortable position to sleep. Use a firm mattress and lie on your side with your knees slightly bent. If you lie on your back, put a pillow under your knees.  Only take over-the-counter or prescription medicines as directed by your caregiver. Over-the-counter medicines to reduce pain and inflammation are often the most helpful.Your caregiver may prescribe muscle relaxant drugs.These medicines help dull your pain so you can more quickly return to your normal activities and healthy exercise.  Put ice on the injured area.  Put ice in a plastic bag.  Place a towel between your skin and the bag.  Leave the ice on for 15-20 minutes, 03-04 times a day  for the first 2 to 3 days. After that, ice and heat may be alternated to reduce pain and spasms.  Ask your caregiver about trying back exercises and gentle massage. This may be of some benefit.  Avoid feeling anxious or stressed.Stress increases muscle tension and can worsen back pain.It is important to recognize when you are anxious or stressed and learn ways to manage it.Exercise is a great option. SEEK MEDICAL CARE IF:  You have pain that is not relieved with rest or medicine.  You have pain that does not improve in 1 week.  You have new symptoms.  You are generally not feeling well. SEEK IMMEDIATE MEDICAL CARE IF:   You have pain that radiates from your back into your legs.  You develop new bowel or bladder control problems.  You have unusual weakness or numbness in your arms or legs.  You develop nausea or vomiting.  You develop abdominal pain.  You feel faint. Document Released: 07/28/2005 Document Revised: 01/27/2012 Document Reviewed: 11/29/2013 [facility] Patient Information 2015 [place], [place]. This information is not intended to replace advice given to you by your health care provider. Make sure you discuss any questions you have  with your health care provider.      I personally performed the services described in this documentation, which was scribed in my presence. The recorded information has been reviewed and considered, and addended by me as needed.

## 2014-09-20 LAB — URINE CULTURE

## 2014-10-30 DIAGNOSIS — H40013 Open angle with borderline findings, low risk, bilateral: Secondary | ICD-10-CM | POA: Diagnosis not present

## 2014-10-30 DIAGNOSIS — H2510 Age-related nuclear cataract, unspecified eye: Secondary | ICD-10-CM | POA: Diagnosis not present

## 2014-11-27 ENCOUNTER — Other Ambulatory Visit: Payer: Self-pay

## 2014-12-04 ENCOUNTER — Encounter: Payer: Self-pay | Admitting: Internal Medicine

## 2014-12-04 ENCOUNTER — Ambulatory Visit (INDEPENDENT_AMBULATORY_CARE_PROVIDER_SITE_OTHER): Payer: Medicare Other | Admitting: Internal Medicine

## 2014-12-04 ENCOUNTER — Other Ambulatory Visit: Payer: Self-pay

## 2014-12-04 VITALS — BP 122/64 | HR 75 | Temp 97.7°F | Ht 63.0 in | Wt 146.5 lb

## 2014-12-04 DIAGNOSIS — N390 Urinary tract infection, site not specified: Secondary | ICD-10-CM

## 2014-12-04 DIAGNOSIS — F329 Major depressive disorder, single episode, unspecified: Secondary | ICD-10-CM

## 2014-12-04 DIAGNOSIS — I10 Essential (primary) hypertension: Secondary | ICD-10-CM

## 2014-12-04 DIAGNOSIS — I6523 Occlusion and stenosis of bilateral carotid arteries: Secondary | ICD-10-CM | POA: Diagnosis not present

## 2014-12-04 DIAGNOSIS — F419 Anxiety disorder, unspecified: Principal | ICD-10-CM

## 2014-12-04 DIAGNOSIS — E785 Hyperlipidemia, unspecified: Secondary | ICD-10-CM

## 2014-12-04 DIAGNOSIS — F418 Other specified anxiety disorders: Secondary | ICD-10-CM

## 2014-12-04 DIAGNOSIS — B3749 Other urogenital candidiasis: Secondary | ICD-10-CM

## 2014-12-04 LAB — URINALYSIS, ROUTINE W REFLEX MICROSCOPIC
BILIRUBIN URINE: NEGATIVE
Hgb urine dipstick: NEGATIVE
Ketones, ur: NEGATIVE
Nitrite: POSITIVE — AB
PH: 5.5 (ref 5.0–8.0)
Specific Gravity, Urine: >=1.03 — AB (ref 1.000–1.030)
Total Protein, Urine: NEGATIVE
Urine Glucose: NEGATIVE
Urobilinogen, UA: 0.2 (ref 0.0–1.0)

## 2014-12-04 MED ORDER — FUROSEMIDE 40 MG PO TABS: 40.0000 mg | ORAL_TABLET | Freq: Every day | ORAL | Status: DC | PRN

## 2014-12-04 MED ORDER — ATORVASTATIN CALCIUM 40 MG PO TABS: 40.0000 mg | ORAL_TABLET | Freq: Every day | ORAL | Status: DC

## 2014-12-04 MED ORDER — FLUOXETINE HCL 20 MG PO TABS: 20.0000 mg | ORAL_TABLET | Freq: Every day | ORAL | Status: DC

## 2014-12-04 NOTE — Assessment & Plan Note (Signed)
Seems well-controlled, no change, recent BMP satisfactory

## 2014-12-04 NOTE — Assessment & Plan Note (Signed)
At least 4 documented UTIs in the last 4 years, last episode 2 months ago, keflex helped  temporarily but since then she's having chronic symptoms. Plan: UA, urine culture, urology referral

## 2014-12-04 NOTE — Assessment & Plan Note (Addendum)
See HPI, not  doing well, symptoms  triggered by the suicide of her son in 2010, that is a bigg loss, has not been dealing with that in a healthy way, she is counseled to the best of my ability. I agree with the patient's family that she needs medication and counseling. Contact information to see one of our counselors provided. We will try fluoxetine, 10 mg the first 10 days, then 20 mg Come back in 2 months for a checkup

## 2014-12-04 NOTE — Progress Notes (Signed)
Subjective:    Patient ID: Patricia [name], female    DOB: Apr 19, 1941, 74 y.o.   MRN: [id number]  DOS:  12/04/2014 Type of visit - description : f/u Interval history: Her main concern today is depression, she lost her son in 2010, she is still dealing with the issue, in fact her strategy so far has been to  ignoring the problem but her family noticed that she is withdraw and depressed and they recommend her to talk about medication and counseling.  Also, recurrent  UTIs, last one was 2 months ago, she took Keflex temporarily, symptoms decreased but now has urinary frequency, urine looks cloudy and has an odor x weeks.  CAD, hypertension: Asymptomatic, good medication compliance.   Review of Systems  no fever chills No chest pain, difficulty breathing or lower extremity edema Denies any suicidal ideas Denies any vaginal discharge, rash. No vaginal dryness, no gross hematuria  Past Medical History  Diagnosis Date  . Osteopenia   . COPD (chronic obstructive pulmonary disease)   . Hyperlipidemia   . Thyroid nodule   . Abnormal LFTs   . Anxiety and depression     son commited suicide 2010  . HTN (hypertension)     intolerant to ACEi (cough)  . Allergic rhinitis   . Panic attacks   . PAD (peripheral artery disease)     Carotid dz as  below, and at [facility] 11/12:  critical stenosis noted just below the sheath site leading into a totally occluded SFA and a patent deep femoral artery  . Carotid artery disease     Moderate - f/u dopplers needed 11/2012  . CAD (coronary artery disease)     s/p CABG  . 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 Coronary angioplasty with stent placement      "1; makes total of 3" (07/05/2012)  . Vaginal hysterectomy  ~ 1976    no oophorectomy  . 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 Coronary artery bypass graft N/A  12/15/2013    Procedure: CORONARY ARTERY BYPASS GRAFTING (CABG);  Surgeon: Gaye Pollack, MD;  Location: [place];  Service: Open Heart Surgery;  Laterality: N/A;  Times 3 using left internal mammary artery and endoscopically harvested right saphenous vein   . Intraoperative transesophageal echocardiogram N/A 12/15/2013    Procedure: INTRAOPERATIVE TRANSESOPHAGEAL ECHOCARDIOGRAM;  Surgeon: Gaye Pollack, MD;  Location: [facility] OR;  Service: Open Heart Surgery;  Laterality: N/A;  . Left heart catheterization with coronary angiogram N/A 07/02/2011    Procedure: LEFT HEART CATHETERIZATION WITH CORONARY ANGIOGRAM;  Surgeon: Sherren Mocha, MD;  Location: [facility] CATH LAB;  Service: Cardiovascular;  Laterality: N/A;  . Percutaneous coronary intervention-balloon only  07/02/2011    Procedure: PERCUTANEOUS CORONARY INTERVENTION-BALLOON ONLY;  Surgeon: Sherren Mocha, MD;  Location: [facility] CATH LAB;  Service: Cardiovascular;;  . Left heart catheterization with coronary angiogram N/A 07/05/2012    Procedure: LEFT HEART CATHETERIZATION WITH CORONARY ANGIOGRAM;  Surgeon: Sherren Mocha, MD;  Location: [facility] CATH LAB;  Service: Cardiovascular;  Laterality: N/A;  . Percutaneous coronary intervention-balloon only  07/05/2012    Procedure: PERCUTANEOUS CORONARY INTERVENTION-BALLOON ONLY;  Surgeon: Sherren Mocha, MD;  Location: [facility] CATH LAB;  Service: Cardiovascular;;  . Left heart catheterization with coronary angiogram N/A 11/28/2013    Procedure: LEFT HEART CATHETERIZATION  WITH CORONARY ANGIOGRAM;  Surgeon: Blane Ohara, MD;  Location: [facility] CATH LAB;  Service: Cardiovascular;  Laterality: N/A;    History   Social History  . Marital Status: Widowed    Spouse Name: N/A  . Number of Children: 1  . Years of Education: N/A   Occupational History  . Retired-2003    Social History Main Topics  . Smoking status: Former Smoker -- 0.50 packs/day for 54 years    Types: Cigarettes    Quit date: 12/15/2013  . Smokeless tobacco:  Never Used     Comment: quit tobacco 12-2013 after CABG  . Alcohol Use: No  . Drug Use: No  . Sexual Activity: No   Other Topics Concern  . Not on file   Social History Narrative   G son lives w/ her         Medication List       This list is accurate as of: 12/04/14  6:41 PM.  Always use your most recent med list.               amLODipine 5 MG tablet  Commonly known as:  NORVASC  Take 1 tablet (5 mg total) by mouth daily.     aspirin 325 MG EC tablet  Take 1 tablet (325 mg total) by mouth daily.     atorvastatin 40 MG tablet  Commonly known as:  LIPITOR  Take 1 tablet (40 mg total) by mouth at bedtime.     clonazePAM 0.5 MG tablet  Commonly known as:  KLONOPIN  - 1 tab in the morning as neede for anxiety and   - 1.5 or 2 tabs at night as needed for insomnia     FLUoxetine 20 MG tablet  Commonly known as:  PROZAC  Take 1 tablet (20 mg total) by mouth daily.     furosemide 40 MG tablet  Commonly known as:  LASIX  Take 1 tablet (40 mg total) by mouth daily as needed.     KLOR-CON M20 20 MEQ tablet  Generic drug:  potassium chloride SA  Take 1 tablet by mouth daily as needed. Take it along with lasix     losartan 100 MG tablet  Commonly known as:  COZAAR  Take 1 tablet (100 mg total) by mouth daily.     metoprolol tartrate 25 MG tablet  Commonly known as:  LOPRESSOR  TAKE 1 TABLET BY MOUTH TWICE A DAY     Vitamin D3 1000 UNITS Caps  Take 1,000 Units by mouth daily.           Objective:   Physical Exam BP 122/64 mmHg  Pulse 75  Temp(Src) 97.7 F (36.5 C) (Oral)  Ht 5\' 3"  (1.6 m)  Wt 146 lb 8 oz (66.452 kg)  BMI 25.96 kg/m2  SpO2 99% General:   Well developed, well nourished.  HEENT:  Normocephalic . Face symmetric, atraumatic Lungs:  CTA B Normal respiratory effort, no intercostal retractions, no accessory muscle use. Heart: RRR,  no murmur.  Abdomen: Soft, nontender Muscle skeletal: no pretibial edema bilaterally  Skin: Not pale. Not  jaundice Neurologic:  alert & oriented X3.  Speech normal, gait appropriate for age and unassisted Psych--  Cognition and judgment appear intact.  Cooperative with normal attention span and concentration.  Tearful, sad when we talked about her son.        Assessment & Plan:

## 2014-12-04 NOTE — Patient Instructions (Signed)
Go to the lab and provide a urine sample  Fluoxetine: The first 7-10 days take only half tablet daily Then take 1 tablet every day  Please call the [facility] counseling services  Next visit in 2 months

## 2014-12-04 NOTE — Assessment & Plan Note (Signed)
Last FLP satisfactory, refill medications

## 2014-12-04 NOTE — Progress Notes (Signed)
Pre visit review using our clinic review tool, if applicable. No additional management support is needed unless otherwise documented below in the visit note. 

## 2014-12-06 ENCOUNTER — Encounter: Payer: Self-pay | Admitting: Internal Medicine

## 2014-12-07 LAB — URINE CULTURE: Colony Count: >=100000

## 2014-12-07 MED ORDER — SULFAMETHOXAZOLE-TRIMETHOPRIM 800-160 MG PO TABS
1.0000 | ORAL_TABLET | Freq: Two times a day (BID) | ORAL | Status: DC
Start: 2014-12-07 — End: 2015-01-26

## 2014-12-07 NOTE — Addendum Note (Signed)
Addended by: Wilfrid Lund on: 12/07/2014 11:53 AM   Modules accepted: Orders

## 2014-12-12 IMAGING — CR DG CHEST 2V
2 series · 2 of 2 positions shown · non-contrast
Comparison: Portable chest x-ray of 12/19/2013

CLINICAL DATA: Shortness of breath, weakness, cough, status post
CABG

EXAM:
CHEST  2 VIEW

[w chest pa]
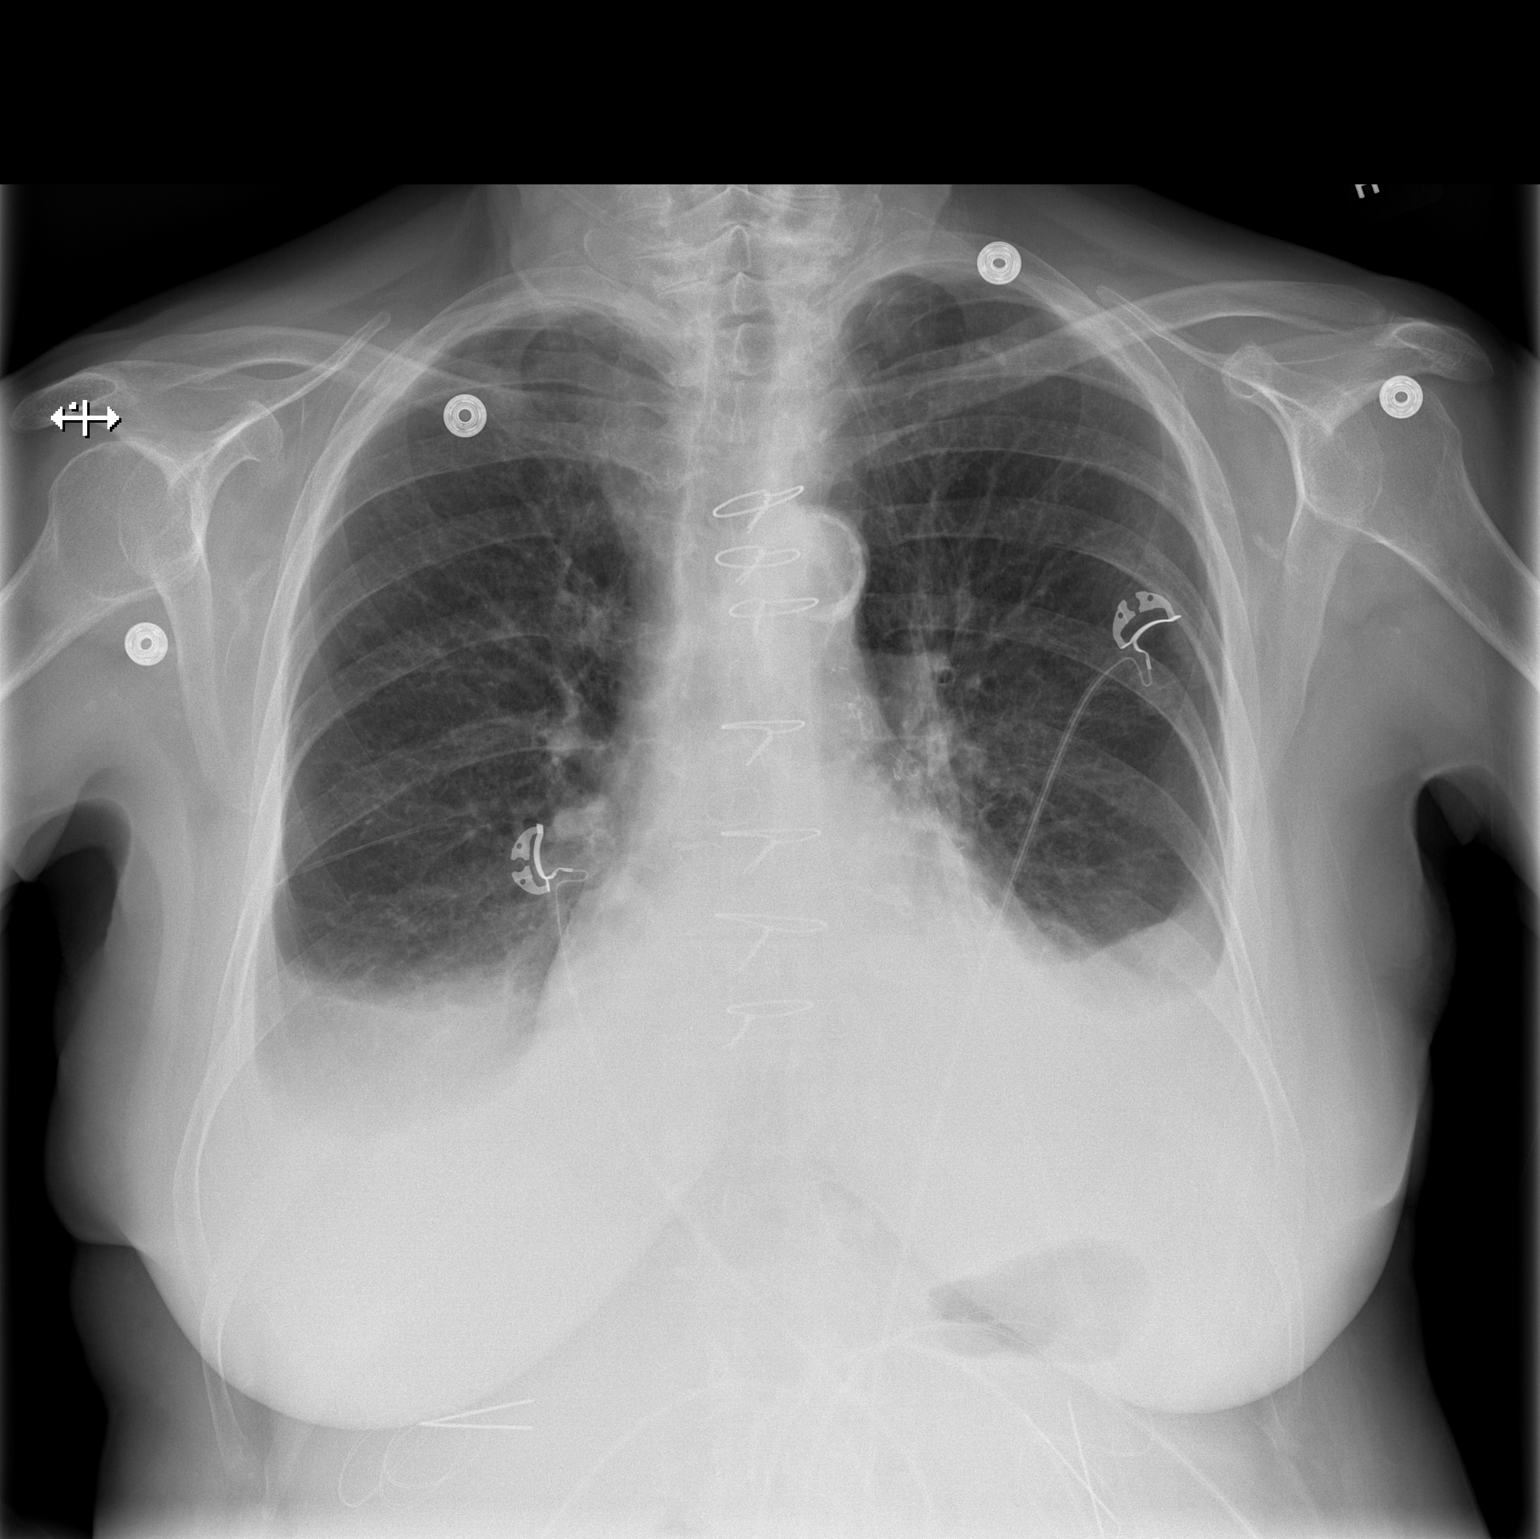

[w chest lat]
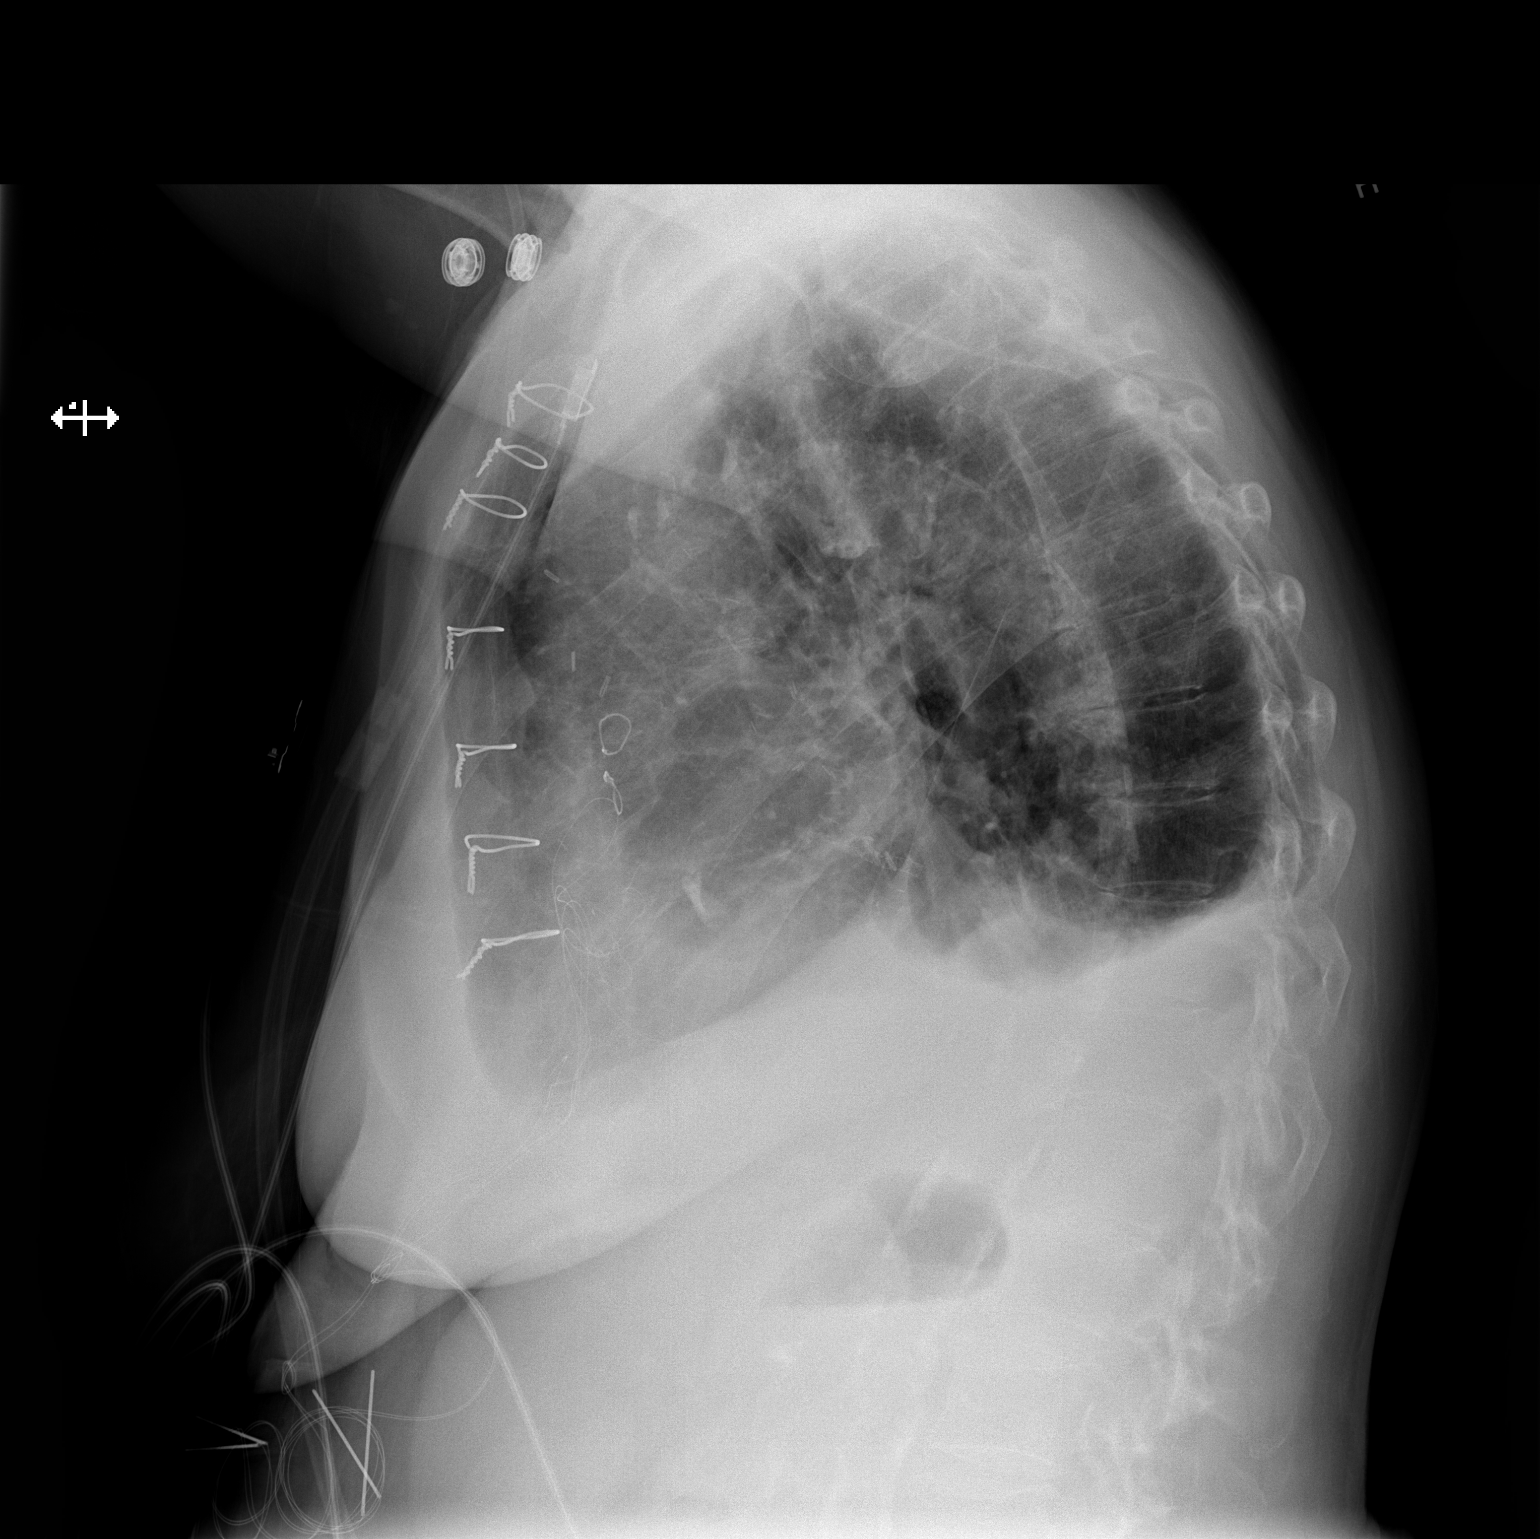

[2 of 2 positions shown; findings below may reference images not displayed]

FINDINGS: The venous sheath has been removed from the SVC. No pneumothorax is
noted. There are bibasilar opacities representing pleural effusions
and basilar atelectasis. Mild cardiomegaly is stable. Median
sternotomy sutures are noted.
IMPRESSION: Persistent bilateral pleural effusions and bibasilar atelectasis.

## 2014-12-29 ENCOUNTER — Ambulatory Visit (INDEPENDENT_AMBULATORY_CARE_PROVIDER_SITE_OTHER): Payer: Medicare Other | Admitting: Psychology

## 2014-12-29 DIAGNOSIS — F322 Major depressive disorder, single episode, severe without psychotic features: Secondary | ICD-10-CM | POA: Diagnosis not present

## 2015-01-15 ENCOUNTER — Other Ambulatory Visit: Payer: Self-pay | Admitting: Internal Medicine

## 2015-01-15 NOTE — Telephone Encounter (Signed)
Pt is requesting refill on Clonazepam.  Last OV: 12/04/2014 Last Fill: 08/29/2014 #[id number] UDS: 08/29/2014 Low risk  Please advise.

## 2015-01-15 NOTE — Telephone Encounter (Signed)
Rx faxed to [facility] pharmacy  

## 2015-01-15 NOTE — Telephone Encounter (Signed)
Rx printed, awaiting MD signature.  

## 2015-01-15 NOTE — Telephone Encounter (Signed)
Ok 60 and 3 RF

## 2015-01-17 ENCOUNTER — Ambulatory Visit (INDEPENDENT_AMBULATORY_CARE_PROVIDER_SITE_OTHER): Payer: Medicare Other | Admitting: Psychology

## 2015-01-17 DIAGNOSIS — F322 Major depressive disorder, single episode, severe without psychotic features: Secondary | ICD-10-CM

## 2015-01-26 ENCOUNTER — Encounter: Payer: Self-pay | Admitting: Internal Medicine

## 2015-01-26 ENCOUNTER — Other Ambulatory Visit: Payer: Self-pay | Admitting: Internal Medicine

## 2015-01-26 ENCOUNTER — Ambulatory Visit (INDEPENDENT_AMBULATORY_CARE_PROVIDER_SITE_OTHER): Payer: Medicare Other | Admitting: Internal Medicine

## 2015-01-26 VITALS — BP 128/58 | HR 51 | Temp 97.9°F | Ht 63.0 in | Wt 142.1 lb

## 2015-01-26 DIAGNOSIS — F418 Other specified anxiety disorders: Secondary | ICD-10-CM

## 2015-01-26 DIAGNOSIS — R7309 Other abnormal glucose: Secondary | ICD-10-CM

## 2015-01-26 DIAGNOSIS — I10 Essential (primary) hypertension: Secondary | ICD-10-CM | POA: Diagnosis not present

## 2015-01-26 DIAGNOSIS — I6523 Occlusion and stenosis of bilateral carotid arteries: Secondary | ICD-10-CM

## 2015-01-26 DIAGNOSIS — N390 Urinary tract infection, site not specified: Secondary | ICD-10-CM | POA: Diagnosis not present

## 2015-01-26 DIAGNOSIS — F329 Major depressive disorder, single episode, unspecified: Secondary | ICD-10-CM

## 2015-01-26 DIAGNOSIS — F419 Anxiety disorder, unspecified: Principal | ICD-10-CM

## 2015-01-26 DIAGNOSIS — R7303 Prediabetes: Secondary | ICD-10-CM

## 2015-01-26 MED ORDER — AMLODIPINE BESYLATE 5 MG PO TABS: 5.0000 mg | ORAL_TABLET | Freq: Every day | ORAL | Status: DC

## 2015-01-26 MED ORDER — METOPROLOL TARTRATE 25 MG PO TABS: 25.0000 mg | ORAL_TABLET | Freq: Two times a day (BID) | ORAL | Status: DC

## 2015-01-26 MED ORDER — FLUOXETINE HCL 20 MG PO TABS
40.0000 mg | ORAL_TABLET | Freq: Every day | ORAL | Status: DC
Start: 2015-01-26 — End: 2015-05-31

## 2015-01-26 NOTE — Assessment & Plan Note (Signed)
Get a A1c, a healthy diet and exercise encouraged

## 2015-01-26 NOTE — Patient Instructions (Signed)
Go to the lab  Increase fluoxetine to 2 tablets every day

## 2015-01-26 NOTE — Assessment & Plan Note (Signed)
BP well-controlled, refill medications, check a BMP

## 2015-01-26 NOTE — Progress Notes (Signed)
Subjective:    Patient ID: Patricia [name], female    DOB: Mar 12, 1941, 74 y.o.   MRN: [id number]  DOS:  01/26/2015 Type of visit - description : rov Interval history: Anxiety depression, started fluoxetine, has seen the counselor twice, has improved but there is still some room to go. Status post a UTI, currently asymptomatic. High cholesterol, good compliance of medication without apparent side effects Hypertension, good compliance of medication, needs a refill. Due for a BMP.   Review of Systems Sleeping better with clonazepam No chest pain or difficulty breathing No nausea, vomiting, diarrhea  Past Medical History  Diagnosis Date  . Osteopenia   . COPD (chronic obstructive pulmonary disease)   . Hyperlipidemia   . Thyroid nodule   . Abnormal LFTs   . Anxiety and depression     son commited suicide 2010  . HTN (hypertension)     intolerant to ACEi (cough)  . Allergic rhinitis   . Panic attacks   . PAD (peripheral artery disease)     Carotid dz as  below, and at [facility] 11/12:  critical stenosis noted just below the sheath site leading into a totally occluded SFA and a patent deep femoral artery  . Carotid artery disease     Moderate - f/u dopplers needed 11/2012  . CAD (coronary artery disease)     s/p CABG  . 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 Coronary angioplasty with stent placement      "1; makes total of 3" (07/05/2012)  . Vaginal hysterectomy  ~ 1976    no oophorectomy  . 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 Coronary artery bypass graft N/A 12/15/2013    Procedure: CORONARY ARTERY BYPASS GRAFTING (CABG);  Surgeon: Gaye Pollack, MD;  Location: [place];  Service: Open Heart Surgery;  Laterality: N/A;  Times 3 using left internal mammary artery and endoscopically harvested right saphenous vein   . Intraoperative  transesophageal echocardiogram N/A 12/15/2013    Procedure: INTRAOPERATIVE TRANSESOPHAGEAL ECHOCARDIOGRAM;  Surgeon: Gaye Pollack, MD;  Location: [facility] OR;  Service: Open Heart Surgery;  Laterality: N/A;  . Left heart catheterization with coronary angiogram N/A 07/02/2011    Procedure: LEFT HEART CATHETERIZATION WITH CORONARY ANGIOGRAM;  Surgeon: Sherren Mocha, MD;  Location: [facility] CATH LAB;  Service: Cardiovascular;  Laterality: N/A;  . Percutaneous coronary intervention-balloon only  07/02/2011    Procedure: PERCUTANEOUS CORONARY INTERVENTION-BALLOON ONLY;  Surgeon: Sherren Mocha, MD;  Location: [facility] CATH LAB;  Service: Cardiovascular;;  . Left heart catheterization with coronary angiogram N/A 07/05/2012    Procedure: LEFT HEART CATHETERIZATION WITH CORONARY ANGIOGRAM;  Surgeon: Sherren Mocha, MD;  Location: [facility] CATH LAB;  Service: Cardiovascular;  Laterality: N/A;  . Percutaneous coronary intervention-balloon only  07/05/2012    Procedure: PERCUTANEOUS CORONARY INTERVENTION-BALLOON ONLY;  Surgeon: Sherren Mocha, MD;  Location: [facility] CATH LAB;  Service: Cardiovascular;;  . Left heart catheterization with coronary angiogram N/A 11/28/2013    Procedure: LEFT HEART CATHETERIZATION WITH CORONARY ANGIOGRAM;  Surgeon: Blane Ohara, MD;  Location: [facility] CATH LAB;  Service: Cardiovascular;  Laterality: N/A;    History   Social History  . Marital Status: Widowed    Spouse Name: N/A  . Number of Children: 1  . Years of Education: N/A   Occupational History  .  Retired-2003    Social History Main Topics  . Smoking status: Former Smoker -- 0.50 packs/day for 54 years    Types: Cigarettes    Quit date: 12/15/2013  . Smokeless tobacco: Never Used     Comment: quit tobacco 12-2013 after CABG  . Alcohol Use: No  . Drug Use: No  . Sexual Activity: No   Other Topics Concern  . Not on file   Social History Narrative   G son lives w/ her         Medication List       This list is accurate as of:  01/26/15 11:59 PM.  Always use your most recent med list.               amLODipine 5 MG tablet  Commonly known as:  NORVASC  Take 1 tablet (5 mg total) by mouth daily.     aspirin 325 MG EC tablet  Take 1 tablet (325 mg total) by mouth daily.     atorvastatin 40 MG tablet  Commonly known as:  LIPITOR  Take 1 tablet (40 mg total) by mouth at bedtime.     clonazePAM 0.5 MG tablet  Commonly known as:  KLONOPIN  Take 1 tablet by mouth in the morning as need for anxiety, and 1.5-2 tablets at night as needed for sleep.     FLUoxetine 20 MG tablet  Commonly known as:  PROZAC  Take 2 tablets (40 mg total) by mouth daily.     furosemide 40 MG tablet  Commonly known as:  LASIX  Take 1 tablet (40 mg total) by mouth daily as needed.     KLOR-CON M20 20 MEQ tablet  Generic drug:  potassium chloride SA  Take 1 tablet by mouth daily as needed. Take it along with lasix     losartan 100 MG tablet  Commonly known as:  COZAAR  Take 1 tablet (100 mg total) by mouth daily.     metoprolol tartrate 25 MG tablet  Commonly known as:  LOPRESSOR  Take 1 tablet (25 mg total) by mouth 2 (two) times daily.     Vitamin D3 1000 UNITS Caps  Take 1,000 Units by mouth daily.           Objective:   Physical Exam BP 128/58 mmHg  Pulse 51  Temp(Src) 97.9 F (36.6 C) (Oral)  Ht 5\' 3"  (1.6 m)  Wt 142 lb 2 oz (64.467 kg)  BMI 25.18 kg/m2  SpO2 98%  General:   Well developed, well nourished . NAD.  HEENT:  Normocephalic . Face symmetric, atraumatic Lungs:  CTA B Normal respiratory effort, no intercostal retractions, no accessory muscle use. Heart: RRR,  no murmur.  No pretibial edema bilaterally  Skin: Not pale. Not jaundice Neurologic:  alert & oriented X3.  Speech normal, gait appropriate for age and unassisted Psych--  Cognition and judgment appear intact.  Cooperative with normal attention span and concentration.  Behavior appropriate. No anxious or depressed appearing.         Assessment & Plan:

## 2015-01-26 NOTE — Progress Notes (Signed)
Pre visit review using our clinic review tool, if applicable. No additional management support is needed unless otherwise documented below in the visit note. 

## 2015-01-26 NOTE — Assessment & Plan Note (Signed)
Improving with fluoxetine and counseling. Recommend to increase Prilosec to 40 mg, continue counseling. Reassess on return to the office in 3 months

## 2015-01-26 NOTE — Assessment & Plan Note (Signed)
Prescribed abx a couple months ago, now asymptomatic, urology eval pending

## 2015-01-27 LAB — HEMOGLOBIN A1C
Hgb A1c MFr Bld: 5.7 % — ABNORMAL HIGH (ref <5.7)
Mean Plasma Glucose: 117 mg/dL — ABNORMAL HIGH (ref <117)

## 2015-01-27 LAB — BASIC METABOLIC PANEL
BUN: 12 mg/dL (ref 6–23)
CHLORIDE: 102 meq/L (ref 96–112)
CO2: 28 mEq/L (ref 19–32)
CREATININE: 0.6 mg/dL (ref 0.50–1.10)
Calcium: 9.7 mg/dL (ref 8.4–10.5)
GLUCOSE: 66 mg/dL — AB (ref 70–99)
POTASSIUM: 4.3 meq/L (ref 3.5–5.3)
Sodium: 142 mEq/L (ref 135–145)

## 2015-02-02 ENCOUNTER — Ambulatory Visit (INDEPENDENT_AMBULATORY_CARE_PROVIDER_SITE_OTHER): Payer: Medicare Other | Admitting: Psychology

## 2015-02-02 DIAGNOSIS — F322 Major depressive disorder, single episode, severe without psychotic features: Secondary | ICD-10-CM

## 2015-02-09 DIAGNOSIS — H348122 Central retinal vein occlusion, left eye, stable: Secondary | ICD-10-CM

## 2015-02-09 HISTORY — DX: Central retinal vein occlusion, left eye, stable: H34.8122

## 2015-02-26 DIAGNOSIS — H34832 Tributary (branch) retinal vein occlusion, left eye: Secondary | ICD-10-CM | POA: Diagnosis not present

## 2015-02-26 DIAGNOSIS — H40013 Open angle with borderline findings, low risk, bilateral: Secondary | ICD-10-CM | POA: Diagnosis not present

## 2015-02-26 DIAGNOSIS — H2513 Age-related nuclear cataract, bilateral: Secondary | ICD-10-CM | POA: Diagnosis not present

## 2015-02-27 DIAGNOSIS — H35042 Retinal micro-aneurysms, unspecified, left eye: Secondary | ICD-10-CM | POA: Diagnosis not present

## 2015-02-27 DIAGNOSIS — H35352 Cystoid macular degeneration, left eye: Secondary | ICD-10-CM | POA: Diagnosis not present

## 2015-02-27 DIAGNOSIS — H34812 Central retinal vein occlusion, left eye: Secondary | ICD-10-CM | POA: Diagnosis not present

## 2015-03-02 DIAGNOSIS — H34812 Central retinal vein occlusion, left eye: Secondary | ICD-10-CM | POA: Diagnosis not present

## 2015-03-16 ENCOUNTER — Ambulatory Visit (INDEPENDENT_AMBULATORY_CARE_PROVIDER_SITE_OTHER): Payer: Medicare Other | Admitting: Internal Medicine

## 2015-03-16 ENCOUNTER — Encounter: Payer: Self-pay | Admitting: Internal Medicine

## 2015-03-16 VITALS — BP 124/76 | HR 68 | Temp 97.4°F | Ht 63.0 in | Wt 141.5 lb

## 2015-03-16 DIAGNOSIS — I6523 Occlusion and stenosis of bilateral carotid arteries: Secondary | ICD-10-CM

## 2015-03-16 DIAGNOSIS — H349 Unspecified retinal vascular occlusion: Secondary | ICD-10-CM

## 2015-03-16 DIAGNOSIS — H348192 Central retinal vein occlusion, unspecified eye, stable: Secondary | ICD-10-CM

## 2015-03-16 NOTE — Progress Notes (Signed)
Subjective:    Patient ID: Patricia [name], female    DOB: Mar 04, 1941, 74 y.o.   MRN: [id number]  DOS:  03/16/2015 Type of visit - description : Acute visit Interval history: Few weeks ago developed left eye disturbances, saw her ophthalmologist and was referred to Dr. Zadie Rhine, diagnosed with a left RVO Status post one ocular injection. Symptoms are about the same. Was recommended to see PCP   Review of Systems   denies chest pain or difficulty breathing. No nausea, vomiting, diarrhea  Past Medical History  Diagnosis Date  . Osteopenia   . COPD (chronic obstructive pulmonary disease)   . Hyperlipidemia   . Thyroid nodule   . Abnormal LFTs   . Anxiety and depression     son commited suicide 2010  . HTN (hypertension)     intolerant to ACEi (cough)  . Allergic rhinitis   . Panic attacks   . PAD (peripheral artery disease)     Carotid dz as  below, and at [facility] 11/12:  critical stenosis noted just below the sheath site leading into a totally occluded SFA and a patent deep femoral artery  . Carotid artery disease     Moderate - f/u dopplers needed 11/2012  . CAD (coronary artery disease)     s/p CABG  . 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 Zadie Rhine    Past Surgical History  Procedure Laterality Date  . Coronary angioplasty with stent placement      "1; makes total of 3" (07/05/2012)  . Vaginal hysterectomy  ~ 1976    no oophorectomy  . 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 Coronary artery bypass graft N/A 12/15/2013    Procedure: CORONARY ARTERY BYPASS GRAFTING (CABG);  Surgeon: Gaye Pollack, MD;  Location: [place];  Service: Open Heart Surgery;  Laterality: N/A;  Times 3 using left internal mammary artery and endoscopically harvested right saphenous vein   . Intraoperative  transesophageal echocardiogram N/A 12/15/2013    Procedure: INTRAOPERATIVE TRANSESOPHAGEAL ECHOCARDIOGRAM;  Surgeon: Gaye Pollack, MD;  Location: [facility] OR;  Service: Open Heart Surgery;  Laterality: N/A;  . Left heart catheterization with coronary angiogram N/A 07/02/2011    Procedure: LEFT HEART CATHETERIZATION WITH CORONARY ANGIOGRAM;  Surgeon: Sherren Mocha, MD;  Location: [facility] CATH LAB;  Service: Cardiovascular;  Laterality: N/A;  . Percutaneous coronary intervention-balloon only  07/02/2011    Procedure: PERCUTANEOUS CORONARY INTERVENTION-BALLOON ONLY;  Surgeon: Sherren Mocha, MD;  Location: [facility] CATH LAB;  Service: Cardiovascular;;  . Left heart catheterization with coronary angiogram N/A 07/05/2012    Procedure: LEFT HEART CATHETERIZATION WITH CORONARY ANGIOGRAM;  Surgeon: Sherren Mocha, MD;  Location: [facility] CATH LAB;  Service: Cardiovascular;  Laterality: N/A;  . Percutaneous coronary intervention-balloon only  07/05/2012    Procedure: PERCUTANEOUS CORONARY INTERVENTION-BALLOON ONLY;  Surgeon: Sherren Mocha, MD;  Location: [facility] CATH LAB;  Service: Cardiovascular;;  . Left heart catheterization with coronary angiogram N/A 11/28/2013    Procedure: LEFT HEART CATHETERIZATION WITH CORONARY ANGIOGRAM;  Surgeon: Blane Ohara, MD;  Location: [facility] CATH LAB;  Service: Cardiovascular;  Laterality: N/A;    History   Social History  . Marital Status: Widowed    Spouse Name: N/A  . Number of Children: 1  .  Years of Education: N/A   Occupational History  . Retired-2003    Social History Main Topics  . Smoking status: Former Smoker -- 0.50 packs/day for 54 years    Types: Cigarettes    Quit date: 12/15/2013  . Smokeless tobacco: Never Used     Comment: quit tobacco 12-2013 after CABG  . Alcohol Use: No  . Drug Use: No  . Sexual Activity: No   Other Topics Concern  . Not on file   Social History Narrative   G son lives w/ her         Medication List       This list is accurate as of:  03/16/15  3:20 PM.  Always use your most recent med list.               amLODipine 5 MG tablet  Commonly known as:  NORVASC  Take 1 tablet (5 mg total) by mouth daily.     aspirin 325 MG EC tablet  Take 1 tablet (325 mg total) by mouth daily.     atorvastatin 40 MG tablet  Commonly known as:  LIPITOR  Take 1 tablet (40 mg total) by mouth at bedtime.     clonazePAM 0.5 MG tablet  Commonly known as:  KLONOPIN  Take 1 tablet by mouth in the morning as need for anxiety, and 1.5-2 tablets at night as needed for sleep.     FLUoxetine 20 MG tablet  Commonly known as:  PROZAC  Take 2 tablets (40 mg total) by mouth daily.     furosemide 40 MG tablet  Commonly known as:  LASIX  Take 1 tablet (40 mg total) by mouth daily as needed.     KLOR-CON M20 20 MEQ tablet  Generic drug:  potassium chloride SA  Take 1 tablet by mouth daily as needed. Take it along with lasix     losartan 100 MG tablet  Commonly known as:  COZAAR  Take 1 tablet (100 mg total) by mouth daily.     metoprolol tartrate 25 MG tablet  Commonly known as:  LOPRESSOR  Take 1 tablet (25 mg total) by mouth 2 (two) times daily.     Vitamin D3 1000 UNITS Caps  Take 1,000 Units by mouth daily.           Objective:   Physical Exam BP 124/76 mmHg  Pulse 68  Temp(Src) 97.4 F (36.3 C) (Oral)  Ht 5\' 3"  (1.6 m)  Wt 141 lb 8 oz (64.184 kg)  BMI 25.07 kg/m2  SpO2 96% General:   Well developed, well nourished . NAD.  HEENT:  Normocephalic . Face symmetric, atraumatic EOMI, pupils equal and reactive. Lungs:  CTA B Normal respiratory effort, no intercostal retractions, no accessory muscle use. Heart: RRR,  no murmur.  No pretibial edema bilaterally  Skin: Not pale. Not jaundice Neurologic:  alert & oriented X3.  Speech normal, gait appropriate for age and unassisted Psych--  Cognition and judgment appear intact.  Cooperative with normal attention span and concentration.  Behavior appropriate. No anxious or  depressed appearing.      Assessment & Plan:

## 2015-03-16 NOTE — Patient Instructions (Signed)
Schedule a physical at your convenience

## 2015-03-16 NOTE — Progress Notes (Signed)
Pre visit review using our clinic review tool, if applicable. No additional management support is needed unless otherwise documented below in the visit note. 

## 2015-03-20 DIAGNOSIS — H348192 Central retinal vein occlusion, unspecified eye, stable: Secondary | ICD-10-CM | POA: Insufficient documentation

## 2015-03-20 NOTE — Assessment & Plan Note (Signed)
L Retinal  vein occlusion We'll get records from ophthalmology and conduct further eval if needed. Addendum: Notes from ophthalmology reviewed, was seen 02/27/2015, DX with left RVO. Seen again 03/02/2015, got a local eye injection. No further eval was recommended. Plan- continue controlling the cardiovascular risk factors.

## 2015-03-21 ENCOUNTER — Other Ambulatory Visit: Payer: Self-pay

## 2015-03-30 DIAGNOSIS — M216X1 Other acquired deformities of right foot: Secondary | ICD-10-CM | POA: Diagnosis not present

## 2015-03-30 DIAGNOSIS — M722 Plantar fascial fibromatosis: Secondary | ICD-10-CM | POA: Diagnosis not present

## 2015-03-30 DIAGNOSIS — M65872 Other synovitis and tenosynovitis, left ankle and foot: Secondary | ICD-10-CM | POA: Diagnosis not present

## 2015-03-30 DIAGNOSIS — M79609 Pain in unspecified limb: Secondary | ICD-10-CM | POA: Diagnosis not present

## 2015-03-30 DIAGNOSIS — M65871 Other synovitis and tenosynovitis, right ankle and foot: Secondary | ICD-10-CM | POA: Diagnosis not present

## 2015-03-30 DIAGNOSIS — M216X2 Other acquired deformities of left foot: Secondary | ICD-10-CM | POA: Diagnosis not present

## 2015-04-05 ENCOUNTER — Other Ambulatory Visit: Payer: Self-pay | Admitting: Cardiovascular Disease

## 2015-04-05 DIAGNOSIS — I6523 Occlusion and stenosis of bilateral carotid arteries: Secondary | ICD-10-CM

## 2015-04-06 DIAGNOSIS — H34812 Central retinal vein occlusion, left eye: Secondary | ICD-10-CM | POA: Diagnosis not present

## 2015-04-13 ENCOUNTER — Encounter (HOSPITAL_COMMUNITY): Payer: Self-pay

## 2015-04-13 ENCOUNTER — Ambulatory Visit (HOSPITAL_COMMUNITY)
Admission: RE | Admit: 2015-04-13 | Discharge: 2015-04-13 | Disposition: A | Discharge status: Home / Self Care | Payer: Medicare Other | Source: Ambulatory Visit | Attending: Cardiovascular Disease | Admitting: Cardiovascular Disease

## 2015-04-13 DIAGNOSIS — I6523 Occlusion and stenosis of bilateral carotid arteries: Secondary | ICD-10-CM

## 2015-04-13 DIAGNOSIS — F17200 Nicotine dependence, unspecified, uncomplicated: Secondary | ICD-10-CM | POA: Insufficient documentation

## 2015-04-13 DIAGNOSIS — E785 Hyperlipidemia, unspecified: Secondary | ICD-10-CM | POA: Diagnosis not present

## 2015-04-13 DIAGNOSIS — I10 Essential (primary) hypertension: Secondary | ICD-10-CM | POA: Insufficient documentation

## 2015-04-17 ENCOUNTER — Encounter: Payer: Self-pay | Admitting: *Deleted

## 2015-04-18 ENCOUNTER — Encounter: Payer: Self-pay | Admitting: Nurse Practitioner

## 2015-04-18 ENCOUNTER — Ambulatory Visit (INDEPENDENT_AMBULATORY_CARE_PROVIDER_SITE_OTHER): Payer: Medicare Other | Admitting: Nurse Practitioner

## 2015-04-18 VITALS — BP 160/80 | HR 57 | Ht 62.0 in | Wt 139.8 lb

## 2015-04-18 DIAGNOSIS — E785 Hyperlipidemia, unspecified: Secondary | ICD-10-CM | POA: Diagnosis not present

## 2015-04-18 DIAGNOSIS — I6523 Occlusion and stenosis of bilateral carotid arteries: Secondary | ICD-10-CM

## 2015-04-18 DIAGNOSIS — I4891 Unspecified atrial fibrillation: Secondary | ICD-10-CM

## 2015-04-18 LAB — HEPATIC FUNCTION PANEL
ALT: 21 U/L (ref 0–35)
AST: 18 U/L (ref 0–37)
Albumin: 3.7 g/dL (ref 3.5–5.2)
Alkaline Phosphatase: 181 U/L — ABNORMAL HIGH (ref 39–117)
Bilirubin, Direct: 0.1 mg/dL (ref 0.0–0.3)
Total Bilirubin: 0.4 mg/dL (ref 0.2–1.2)
Total Protein: 6.5 g/dL (ref 6.0–8.3)

## 2015-04-18 LAB — BASIC METABOLIC PANEL
BUN: 13 mg/dL (ref 6–23)
CO2: 32 mEq/L (ref 19–32)
Calcium: 9.5 mg/dL (ref 8.4–10.5)
Chloride: 102 mEq/L (ref 96–112)
Creatinine, Ser: 0.61 mg/dL (ref 0.40–1.20)
GFR: 101.59 mL/min (ref >60.00)
Glucose, Bld: 92 mg/dL (ref 70–99)
Potassium: 4.6 mEq/L (ref 3.5–5.1)
Sodium: 139 mEq/L (ref 135–145)

## 2015-04-18 LAB — LIPID PANEL
Cholesterol: 150 mg/dL (ref 0–200)
HDL: 60.8 mg/dL (ref >39.00)
LDL Cholesterol: 74 mg/dL (ref 0–99)
NonHDL: 88.75
Total CHOL/HDL Ratio: 2
Triglycerides: 76 mg/dL (ref 0.0–149.0)
VLDL: 15.2 mg/dL (ref 0.0–40.0)

## 2015-04-18 NOTE — Patient Instructions (Addendum)
We will be checking the following labs today - BMET, lipids, HPF   Medication Instructions:    Continue with your current medicines.     Testing/Procedures To Be Arranged:  N/A  Follow-Up:  See Dr. Burt Knack in 6 months   Other Special Instructions:   N/A  Call the [place] office at [phone] if you have any questions, problems or concerns.

## 2015-04-18 NOTE — Progress Notes (Signed)
CARDIOLOGY OFFICE NOTE  Date:  04/18/2015    Patricia [name] Date of Birth: Oct 13, 1940 Medical Record #[id number]  PCP:  Kathlene November, MD  Cardiologist:  Burt Knack    Chief Complaint  Patient presents with  . Coronary Artery Disease    8 month check - seen for Dr. Burt Knack    History of Present Illness: Patricia [name] is a 74 y.o. female who presents today for a follow up visit. Seen for Dr. Burt Knack. She has a long history of CAD and has undergone multiple PCI procedures. 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 She ultimately underwent multivessel CABG in May 2015 by Dr. Cyndia Bent. Her other issues are as noted below.   Last seen in January - felt to be doing well.   Comes in today. Here alone. She notes that she is doing well. Trying to stay active. No chest pain. Says her breathing is ok. Not dizzy or lightheaded. Ok on her medicines. Having to get eye injections which has caused some anxiety. She is pleased with how she is doing. BP better as an outpatient.   Past Medical History  Diagnosis Date  . Osteopenia   . COPD (chronic obstructive pulmonary disease)   . Hyperlipidemia   . Thyroid nodule   . Abnormal LFTs   . Anxiety and depression     son commited suicide 2010  . HTN (hypertension)     intolerant to ACEi (cough)  . Allergic rhinitis   . Panic attacks   . PAD (peripheral artery disease)     Carotid dz as  below, and at [facility] 11/12:  critical stenosis noted just below the sheath site leading into a totally occluded SFA and a patent deep femoral artery  . Carotid artery disease     Moderate - f/u dopplers needed 11/2012  . CAD (coronary artery disease)     s/p CABG  . 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 Zadie Rhine    Past Surgical History  Procedure Laterality  Date  . Coronary angioplasty with stent placement      "1; makes total of 3" (07/05/2012)  . Vaginal hysterectomy  ~ 1976    no oophorectomy  . 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 Coronary artery bypass graft N/A 12/15/2013    Procedure: CORONARY ARTERY BYPASS GRAFTING (CABG);  Surgeon: Gaye Pollack, MD;  Location: [place];  Service: Open Heart Surgery;  Laterality: N/A;  Times 3 using left internal mammary artery and endoscopically harvested right saphenous vein   . Intraoperative transesophageal echocardiogram N/A 12/15/2013    Procedure: INTRAOPERATIVE TRANSESOPHAGEAL ECHOCARDIOGRAM;  Surgeon: Gaye Pollack, MD;  Location: [facility] OR;  Service: Open Heart Surgery;  Laterality: N/A;  . Left heart catheterization with coronary angiogram N/A 07/02/2011    Procedure: LEFT HEART CATHETERIZATION WITH CORONARY ANGIOGRAM;  Surgeon: Sherren Mocha, MD;  Location: [facility] CATH LAB;  Service: Cardiovascular;  Laterality: N/A;  . Percutaneous coronary intervention-balloon only  07/02/2011    Procedure: PERCUTANEOUS CORONARY INTERVENTION-BALLOON ONLY;  Surgeon: Sherren Mocha, MD;  Location: [facility] CATH LAB;  Service: Cardiovascular;;  . Left heart catheterization with coronary  angiogram N/A 07/05/2012    Procedure: LEFT HEART CATHETERIZATION WITH CORONARY ANGIOGRAM;  Surgeon: Sherren Mocha, MD;  Location: [facility] CATH LAB;  Service: Cardiovascular;  Laterality: N/A;  . Percutaneous coronary intervention-balloon only  07/05/2012    Procedure: PERCUTANEOUS CORONARY INTERVENTION-BALLOON ONLY;  Surgeon: Sherren Mocha, MD;  Location: [facility] CATH LAB;  Service: Cardiovascular;;  . Left heart catheterization with coronary angiogram N/A 11/28/2013    Procedure: LEFT HEART CATHETERIZATION WITH CORONARY ANGIOGRAM;  Surgeon: Blane Ohara, MD;  Location: [facility] CATH LAB;  Service: Cardiovascular;  Laterality: N/A;      Medications: Current Outpatient Prescriptions  Medication Sig Dispense Refill  . Aflibercept (EYLEA) 2 MG/0.05ML SOLN Inject 2 mg into the eye every 5 (five) weeks. Dr. Zadie Rhine    . amLODipine (NORVASC) 5 MG tablet Take 1 tablet (5 mg total) by mouth daily. 30 tablet 8  . aspirin EC 325 MG EC tablet Take 1 tablet (325 mg total) by mouth daily.  0  . atorvastatin (LIPITOR) 40 MG tablet Take 1 tablet (40 mg total) by mouth at bedtime. 90 tablet 2  . Cholecalciferol (VITAMIN D3) 1000 UNITS CAPS Take 1,000 Units by mouth daily.    . clonazePAM (KLONOPIN) 0.5 MG tablet Take 1 tablet by mouth in the morning as need for anxiety, and 1.5-2 tablets at night as needed for sleep. 60 tablet 3  . FLUoxetine (PROZAC) 20 MG tablet Take 2 tablets (40 mg total) by mouth daily. 60 tablet 3  . furosemide (LASIX) 40 MG tablet Take 1 tablet (40 mg total) by mouth daily as needed. 90 tablet 2  . KLOR-CON M20 20 MEQ tablet Take 1 tablet by mouth daily as needed. Take it along with lasix    . losartan (COZAAR) 100 MG tablet Take 1 tablet (100 mg total) by mouth daily. 90 tablet 3  . metoprolol tartrate (LOPRESSOR) 25 MG tablet Take 1 tablet (25 mg total) by mouth 2 (two) times daily. 60 tablet 8   No current facility-administered medications for this visit.    Allergies: Allergies  Allergen Reactions  . Ace Inhibitors     cough    Social History: The patient  reports that she quit smoking about 16 months ago. Her smoking use included Cigarettes. She has a 27 pack-year smoking history. She has never used smokeless tobacco. She reports that she does not drink alcohol or use illicit drugs.   Family History: The patient's family history includes Coronary artery disease in her father; Healthy in her grandchild; Heart Problems (age of onset: 69) in her sister; Heart attack in her father; Heart attack (age of onset: 32) in her brother; Hypertension in her mother; Stroke in her mother; Suicidality (age of onset:  91) in her son. There is no history of Colon cancer or Breast cancer.   Review of Systems: Please see the history of present illness.   Otherwise, the review of systems is positive for none.   All other systems are reviewed and negative.   Physical Exam: VS:  BP 160/80 mmHg  Pulse 57  Ht 5\' 2"  (1.575 m)  Wt 139 lb 12.8 oz (63.413 kg)  BMI 25.56 kg/m2 .  BMI Body mass index is 25.56 kg/(m^2).  Wt Readings from Last 3 Encounters:  04/18/15 139 lb 12.8 oz (63.413 kg)  03/16/15 141 lb 8 oz (64.184 kg)  01/26/15 142 lb 2 oz (64.467 kg)   Repeat BP by me is 140/80.  General: Pleasant. Well developed, well nourished and in  no acute distress.  HEENT: Normal. Neck: Supple, no JVD, carotid bruits, or masses noted.  Cardiac: Regular rate and rhythm. No murmurs, rubs, or gallops. No edema.  Respiratory:  Lungs are clear to auscultation bilaterally with normal work of breathing.  GI: Soft and nontender.  MS: No deformity or atrophy. Gait and ROM intact. Skin: Warm and dry. Color is normal.  Neuro:  Strength and sensation are intact and no gross focal deficits noted.  Psych: Alert, appropriate and with normal affect.   LABORATORY DATA:  EKG:  EKG is ordered today. This demonstrates sinus brady with nonspecific ST and T wave changes.  Lab Results  Component Value Date   WBC 10.8* 04/06/2014   HGB 14.9 04/06/2014   HCT 46.2* 04/06/2014   PLT 229.0 04/06/2014   GLUCOSE 66* 01/26/2015   CHOL 148 08/31/2014   TRIG 92.0 08/31/2014   HDL 58.70 08/31/2014   LDLDIRECT 156.0 04/26/2007   LDLCALC 71 08/31/2014   ALT 16 08/31/2014   AST 19 08/31/2014   NA 142 01/26/2015   K 4.3 01/26/2015   CL 102 01/26/2015   CREATININE 0.60 01/26/2015   BUN 12 01/26/2015   CO2 28 01/26/2015   TSH 1.36 04/06/2014   INR 1.42 12/15/2013   HGBA1C 5.7* 01/26/2015    BNP (last 3 results) No results for input(s): BNP in the last 8760 hours.  ProBNP (last 3 results) No results for input(s): PROBNP in  the last 8760 hours.   Other Studies Reviewed Today:   Assessment/Plan: 1. CAD - prior CABG back in 2015 - she is doing well clinically.  2. Carotid disease - stable by recent doppler study.  3. HLD - rechecking labs today  4. HTN - BP fair - has better outpatient control. Have asked her to continue to monitor.  Current medicines are reviewed with the patient today.  The patient does not have concerns regarding medicines other than what has been noted above.  The following changes have been made:  See above.  Labs/ tests ordered today include:    Orders Placed This Encounter  Procedures  . Basic metabolic panel  . Hepatic function panel  . Lipid panel  . EKG 12-Lead     Disposition:   FU with Dr. Burt Knack in 6 months.   Patient is agreeable to this plan and will call if any problems develop in the interim.   Signed: Burtis Junes, RN, ANP-C 04/18/2015 1:52 PM  [facility] [address] [name], [place]  [address] Phone: [phone] Fax: [phone]

## 2015-04-19 ENCOUNTER — Other Ambulatory Visit: Payer: Self-pay | Admitting: *Deleted

## 2015-04-19 DIAGNOSIS — R748 Abnormal levels of other serum enzymes: Secondary | ICD-10-CM

## 2015-05-03 DIAGNOSIS — H2513 Age-related nuclear cataract, bilateral: Secondary | ICD-10-CM | POA: Diagnosis not present

## 2015-05-03 DIAGNOSIS — H40013 Open angle with borderline findings, low risk, bilateral: Secondary | ICD-10-CM | POA: Diagnosis not present

## 2015-05-03 DIAGNOSIS — H34832 Tributary (branch) retinal vein occlusion, left eye: Secondary | ICD-10-CM | POA: Diagnosis not present

## 2015-05-14 DIAGNOSIS — H34812 Central retinal vein occlusion, left eye, with macular edema: Secondary | ICD-10-CM | POA: Diagnosis not present

## 2015-05-28 ENCOUNTER — Ambulatory Visit: Payer: Self-pay | Admitting: Adult Health

## 2015-05-28 ENCOUNTER — Ambulatory Visit (INDEPENDENT_AMBULATORY_CARE_PROVIDER_SITE_OTHER): Payer: Medicare Other

## 2015-05-28 ENCOUNTER — Ambulatory Visit (INDEPENDENT_AMBULATORY_CARE_PROVIDER_SITE_OTHER): Payer: Medicare Other | Admitting: Family Medicine

## 2015-05-28 VITALS — BP 136/66 | HR 60 | Temp 98.3°F | Resp 16 | Ht 62.0 in | Wt 139.0 lb

## 2015-05-28 DIAGNOSIS — M79601 Pain in right arm: Secondary | ICD-10-CM

## 2015-05-28 DIAGNOSIS — M79671 Pain in right foot: Secondary | ICD-10-CM

## 2015-05-28 DIAGNOSIS — Z23 Encounter for immunization: Secondary | ICD-10-CM | POA: Diagnosis not present

## 2015-05-28 DIAGNOSIS — M25561 Pain in right knee: Secondary | ICD-10-CM

## 2015-05-28 DIAGNOSIS — I6523 Occlusion and stenosis of bilateral carotid arteries: Secondary | ICD-10-CM | POA: Diagnosis not present

## 2015-05-28 MED ORDER — TRAMADOL HCL 50 MG PO TABS: 50.0000 mg | ORAL_TABLET | Freq: Three times a day (TID) | ORAL | Status: DC | PRN

## 2015-05-28 NOTE — Patient Instructions (Signed)
Take Tylenol extra strength 500 mg pills 2 pills 3 times daily or the 650 mg extended release 2 pills twice daily (generic brands of acetaminophen)  Take the tramadol 1 pill only when needed for worse pain if being more active  Talk to your orthopedic doctor about orthotics.  Talk to your cardiologist about arterial evaluation for the legs  Do shoulder range of motion exercises as discussed  Do quadriceps tightening exercises for you knees  Flu shot will be given you today  Return if needed

## 2015-05-28 NOTE — Progress Notes (Signed)
Patient ID: Patricia [name], female    DOB: February 02, 1941  Age: 74 y.o. MRN: [id number]  Chief Complaint  Patient presents with  . Foot Pain    both x 1 month  . Knee Pain    right x 5 weeks   . Arm Pain    right x 1 month  . Flu Vaccine    Subjective:   74 year old lady who is here with several complaints. Her right shoulder is been hurting her. Actually not the shoulder. The arm hurts her. The range of motion of the shoulders gotten worse. Knows of no specific trauma. It is been bothering her for a month or so. She does have an appointment with her orthopedic doctor later this week. she has been hurting in her right knee for any more.  Her right knee has been hurting for the last month or so also. No specific injury. It hurts when she walks primarily. She cannot flex and not very far.  Her right foot has been hurting her. She went to a podiatrist about a month ago. He injected her heel with a couple of shots. It hurts her when she puts her feet on the floor in the morning, then it hurts through the day also. She says right foot feels more swollen.   Former smoker until last year. She had heart surgery.  Current allergies, medications, problem list, past/family and social histories reviewed.  Objective:  BP 136/66 mmHg  Pulse 60  Temp(Src) 98.3 F (36.8 C) (Oral)  Resp 16  Ht 5\' 2"  (1.575 m)  Wt 139 lb (63.05 kg)  BMI 25.42 kg/m2  SpO2 96%  right shoulder has decreased range of motion on internal and external rotation. Strength is adequate. She does not have any joint tenderness. Complains of upper arm pain, but it is nontender. The right knee has a little catch in it on flexion and extension. No effusion. Mild tenderness just medial to the patella. No gross abnormalities The right foot looks slightly purplish compared to the left. I cannot palpate pedal pulses on the right, barely can on the left. She has no padding to the sole of her foot. Hurts a little along the ball of the  foot. Assessment & Plan:   Assessment: 1. Right arm pain   2. Right knee pain   3. Right foot pain       Plan: x-rays and then decide what we do with her. Will probably need arterial studies of her legs.  Orders Placed This Encounter  Procedures  . DG Shoulder Right    Order Specific Question:  Reason for Exam (SYMPTOM  OR DIAGNOSIS REQUIRED)    Answer:  right shoulder limited ROM.  Right knee pain.  Right foot pain.    Order Specific Question:  Preferred imaging location?    Answer:  External  . DG Knee Complete 4 Views Right    Standing Status: Future     Number of Occurrences: 1     Standing Expiration Date: 05/27/2016    Order Specific Question:  Reason for Exam (SYMPTOM  OR DIAGNOSIS REQUIRED)    Answer:  right shoulder limited ROM.  Right knee pain.  Right foot pain.    Order Specific Question:  Preferred imaging location?    Answer:  External  . DG Foot 2 Views Right    Standing Status: Future     Number of Occurrences: 1     Standing Expiration Date: 05/27/2016    Order Specific  Question:  Reason for Exam (SYMPTOM  OR DIAGNOSIS REQUIRED)    Answer:  right shoulder limited ROM.  Right knee pain.  Right foot pain.    Order Specific Question:  Preferred imaging location?    Answer:  External    [facility] reading (PRIMARY) by  Dr. Linna Darner Right shoulder reveals no major joint problems noted. She does have a little calcification probably from the artery Right knee x-ray has mild arthritic changes with a small amount of medial narrowing. Calcified artery noted Right foot x-ray. Fairly normal with mild arthritis of toes especially fifth toe. .     There are no Patient Instructions on file for this visit.   No Follow-up on file.   Steffany Schoenfelder, MD 05/28/2015

## 2015-05-30 ENCOUNTER — Other Ambulatory Visit: Payer: Self-pay

## 2015-05-30 DIAGNOSIS — M25561 Pain in right knee: Secondary | ICD-10-CM | POA: Diagnosis not present

## 2015-05-30 DIAGNOSIS — M79672 Pain in left foot: Secondary | ICD-10-CM | POA: Diagnosis not present

## 2015-05-30 DIAGNOSIS — M79671 Pain in right foot: Secondary | ICD-10-CM | POA: Diagnosis not present

## 2015-05-31 ENCOUNTER — Other Ambulatory Visit: Payer: Self-pay | Admitting: Internal Medicine

## 2015-06-06 DIAGNOSIS — M79671 Pain in right foot: Secondary | ICD-10-CM | POA: Diagnosis not present

## 2015-06-25 DIAGNOSIS — H34812 Central retinal vein occlusion, left eye, with macular edema: Secondary | ICD-10-CM | POA: Diagnosis not present

## 2015-06-29 ENCOUNTER — Other Ambulatory Visit: Payer: Self-pay | Admitting: Internal Medicine

## 2015-06-29 ENCOUNTER — Telehealth: Payer: Self-pay

## 2015-06-29 ENCOUNTER — Encounter: Payer: Self-pay | Admitting: Internal Medicine

## 2015-06-29 ENCOUNTER — Ambulatory Visit (HOSPITAL_BASED_OUTPATIENT_CLINIC_OR_DEPARTMENT_OTHER)
Admission: RE | Admit: 2015-06-29 | Discharge: 2015-06-29 | Disposition: A | Discharge status: Home / Self Care | Payer: Medicare Other | Source: Ambulatory Visit | Attending: Internal Medicine | Admitting: Internal Medicine

## 2015-06-29 ENCOUNTER — Ambulatory Visit (INDEPENDENT_AMBULATORY_CARE_PROVIDER_SITE_OTHER): Payer: Medicare Other | Admitting: Internal Medicine

## 2015-06-29 VITALS — BP 116/68 | HR 66 | Temp 97.8°F | Ht 62.0 in | Wt 134.1 lb

## 2015-06-29 DIAGNOSIS — E538 Deficiency of other specified B group vitamins: Secondary | ICD-10-CM | POA: Diagnosis not present

## 2015-06-29 DIAGNOSIS — R739 Hyperglycemia, unspecified: Secondary | ICD-10-CM

## 2015-06-29 DIAGNOSIS — M79672 Pain in left foot: Secondary | ICD-10-CM

## 2015-06-29 DIAGNOSIS — I739 Peripheral vascular disease, unspecified: Secondary | ICD-10-CM

## 2015-06-29 DIAGNOSIS — Z0000 Encounter for general adult medical examination without abnormal findings: Secondary | ICD-10-CM

## 2015-06-29 DIAGNOSIS — M79671 Pain in right foot: Secondary | ICD-10-CM

## 2015-06-29 DIAGNOSIS — M65872 Other synovitis and tenosynovitis, left ankle and foot: Secondary | ICD-10-CM | POA: Insufficient documentation

## 2015-06-29 DIAGNOSIS — R945 Abnormal results of liver function studies: Secondary | ICD-10-CM

## 2015-06-29 DIAGNOSIS — R7989 Other specified abnormal findings of blood chemistry: Secondary | ICD-10-CM

## 2015-06-29 DIAGNOSIS — M79609 Pain in unspecified limb: Secondary | ICD-10-CM | POA: Diagnosis not present

## 2015-06-29 DIAGNOSIS — I10 Essential (primary) hypertension: Secondary | ICD-10-CM | POA: Diagnosis not present

## 2015-06-29 DIAGNOSIS — R5383 Other fatigue: Secondary | ICD-10-CM | POA: Diagnosis not present

## 2015-06-29 DIAGNOSIS — M79673 Pain in unspecified foot: Secondary | ICD-10-CM | POA: Diagnosis present

## 2015-06-29 DIAGNOSIS — I6523 Occlusion and stenosis of bilateral carotid arteries: Secondary | ICD-10-CM | POA: Diagnosis not present

## 2015-06-29 DIAGNOSIS — R7309 Other abnormal glucose: Secondary | ICD-10-CM | POA: Diagnosis not present

## 2015-06-29 LAB — CBC WITH DIFFERENTIAL/PLATELET
BASOS ABS: 0 10*3/uL (ref 0.0–0.1)
Basophils Relative: 0 % (ref 0–1)
EOS PCT: 1 % (ref 0–5)
Eosinophils Absolute: 0.1 10*3/uL (ref 0.0–0.7)
HEMATOCRIT: 43 % (ref 36.0–46.0)
HEMOGLOBIN: 13.9 g/dL (ref 12.0–15.0)
LYMPHS ABS: 2 10*3/uL (ref 0.7–4.0)
LYMPHS PCT: 20 % (ref 12–46)
MCH: 28.8 pg (ref 26.0–34.0)
MCHC: 32.3 g/dL (ref 30.0–36.0)
MCV: 89.2 fL (ref 78.0–100.0)
MPV: 10.1 fL (ref 8.6–12.4)
Monocytes Absolute: 0.6 10*3/uL (ref 0.1–1.0)
Monocytes Relative: 6 % (ref 3–12)
NEUTROS PCT: 73 % (ref 43–77)
Neutro Abs: 7.4 10*3/uL (ref 1.7–7.7)
Platelets: 324 10*3/uL (ref 150–400)
RBC: 4.82 MIL/uL (ref 3.87–5.11)
RDW: 13.7 % (ref 11.5–15.5)
WBC: 10.1 10*3/uL (ref 4.0–10.5)

## 2015-06-29 LAB — HEMOGLOBIN A1C
Hgb A1c MFr Bld: 5.6 % (ref <5.7)
MEAN PLASMA GLUCOSE: 114 mg/dL (ref <117)

## 2015-06-29 NOTE — Progress Notes (Signed)
Subjective:    Patient ID: Patricia [name], female    DOB: 08/01/1941, 74 y.o.   MRN: [id number]  DOS:  06/29/2015 Type of visit - description :  Here for Medicare AWV:   1. Risk factors based on Past M, S, F history: reviewed   2. Physical Activities:  active at home, shopping, no routine exercise     3. Depression/mood: neg screening 4. Hearing: no problems reported or noted   5. ADL's: totally independent , still drives   6. Fall Risk: no recent falls , see instructions   7. Home Safety: does feel safe at home   8. Height, weight, &visual acuity:see VS, vision is ok, does see the eye doctor routinely 9. Counseling: yes   10. Labs ordered based on risk factors: yes   11. Referral Coordination, if needed   12. Care Plan, see a/p   13. Cognitive Assessment: cognition, motor skills and memory seem appropiate  14. Provider list updated 15. End-of-life care discussed, has a HC POA  in addition, we discussed the following Hypertension: Good compliance of medication, no recent ambulatory BPs, states that every time BP is check is okay. High cholesterol: Good compliance with statins Anxiety: Well controlled with current medications Her chief complaint today is 3 months history of feet pain, pain is at the heels and plantar base of toes bilaterally, worse when she walks, sometimes also painful at night, worse w/ walking. Went to see a podiatrist, got a shot at the heel without much help; went to see the orthopedic PA and was recommended shoe inserts. Denies classic claudication (pain in the calf) the pain is not described as burning. CAD, COPD: Essentially doing well, review of systems is negative without major problems  with difficulty breathing; no chest pain no chest pain   Review of Systems  Constitutional: No fever. No chills. No unexplained wt changes. No unusual sweats  HEENT: No dental problems, no ear discharge, no facial swelling, no voice changes. No eye discharge, no eye   redness , no  intolerance to light   Respiratory: No wheezing , no  difficulty breathing. No cough , no mucus production  Cardiovascular: No CP, no leg swelling , no  Palpitations  GI: no nausea, no vomiting, no diarrhea , no  abdominal pain.  No blood in the stools. No dysphagia, no odynophagia    Endocrine: No polyphagia, no polyuria , no polydipsia  GU: No dysuria, gross hematuria, difficulty urinating. No urinary urgency, no frequency.  Musculoskeletal: No joint swellings or unusual aches or pains  Skin: No change in the color of the skin, palor , no  Rash  Allergic, immunologic: No environmental allergies , no  food allergies  Neurological: No dizziness no  syncope. No headaches. No diplopia, no slurred, no slurred speech, no motor deficits, no facial  Numbness  Hematological: No enlarged lymph nodes, no easy bruising , no unusual bleedings  Psychiatry: No suicidal ideas, no hallucinations, no beavior problems, no confusion.  No unusual/severe anxiety, no depression   Past Medical History  Diagnosis Date  . Osteopenia   . COPD (chronic obstructive pulmonary disease) ([place])   . Hyperlipidemia   . Thyroid nodule   . Abnormal LFTs   . Anxiety and depression     son commited suicide 2010  . HTN (hypertension)     intolerant to ACEi (cough)  . Allergic rhinitis   . Panic attacks   . PAD (peripheral artery disease) ([place])  Carotid dz as  below, and at [facility] 11/12:  critical stenosis noted just below the sheath site leading into a totally occluded SFA and a patent deep femoral artery  . Carotid artery disease ([place])     Moderate - f/u dopplers needed 11/2012  . CAD (coronary artery disease)     s/p CABG  . 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 Zadie Rhine    Past Surgical History  Procedure Laterality Date  . Coronary angioplasty with stent placement      "1; makes total of 3"  (07/05/2012)  . Vaginal hysterectomy  ~ 1976    no oophorectomy  . 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 Coronary artery bypass graft N/A 12/15/2013    Procedure: CORONARY ARTERY BYPASS GRAFTING (CABG);  Surgeon: Gaye Pollack, MD;  Location: [place];  Service: Open Heart Surgery;  Laterality: N/A;  Times 3 using left internal mammary artery and endoscopically harvested right saphenous vein   . Intraoperative transesophageal echocardiogram N/A 12/15/2013    Procedure: INTRAOPERATIVE TRANSESOPHAGEAL ECHOCARDIOGRAM;  Surgeon: Gaye Pollack, MD;  Location: [facility] OR;  Service: Open Heart Surgery;  Laterality: N/A;  . Left heart catheterization with coronary angiogram N/A 07/02/2011    Procedure: LEFT HEART CATHETERIZATION WITH CORONARY ANGIOGRAM;  Surgeon: Sherren Mocha, MD;  Location: [facility] CATH LAB;  Service: Cardiovascular;  Laterality: N/A;  . Percutaneous coronary intervention-balloon only  07/02/2011    Procedure: PERCUTANEOUS CORONARY INTERVENTION-BALLOON ONLY;  Surgeon: Sherren Mocha, MD;  Location: [facility] CATH LAB;  Service: Cardiovascular;;  . Left heart catheterization with coronary angiogram N/A 07/05/2012    Procedure: LEFT HEART CATHETERIZATION WITH CORONARY ANGIOGRAM;  Surgeon: Sherren Mocha, MD;  Location: [facility] CATH LAB;  Service: Cardiovascular;  Laterality: N/A;  . Percutaneous coronary intervention-balloon only  07/05/2012    Procedure: PERCUTANEOUS CORONARY INTERVENTION-BALLOON ONLY;  Surgeon: Sherren Mocha, MD;  Location: [facility] CATH LAB;  Service: Cardiovascular;;  . Left heart catheterization with coronary angiogram N/A 11/28/2013    Procedure: LEFT HEART CATHETERIZATION WITH CORONARY ANGIOGRAM;  Surgeon: Blane Ohara, MD;  Location: [facility] CATH LAB;  Service: Cardiovascular;  Laterality: N/A;    Social History   Social History  . Marital Status: Widowed    Spouse Name: N/A  . Number of  Children: 1  . Years of Education: N/A   Occupational History  . Retired-2003    Social History Main Topics  . Smoking status: Former Smoker -- 0.50 packs/day for 54 years    Types: Cigarettes    Quit date: 12/15/2013  . Smokeless tobacco: Never Used     Comment: quit tobacco 12-2013 after CABG  . Alcohol Use: No  . Drug Use: No  . Sexual Activity: No   Other Topics Concern  . Not on file   Social History Narrative   G son lives w/ her    HC-POA -- sister's daughter    Family History  Problem Relation Age of Onset  . Coronary artery disease Father     CAD in female relative <50  . Heart attack Father   . Hypertension Mother   . Stroke Mother   . Colon cancer Neg Hx   . Breast cancer Neg Hx   . Heart attack Brother  60  . Heart Problems Sister 42    perminant pacemaker  . Suicidality Son 45    2010  . Healthy Grandchild          Medication List       This list is accurate as of: 06/29/15 11:59 PM.  Always use your most recent med list.               amLODipine 5 MG tablet  Commonly known as:  NORVASC  Take 1 tablet (5 mg total) by mouth daily.     aspirin 325 MG EC tablet  Take 1 tablet (325 mg total) by mouth daily.     atorvastatin 40 MG tablet  Commonly known as:  LIPITOR  Take 1 tablet (40 mg total) by mouth at bedtime.     clonazePAM 0.5 MG tablet  Commonly known as:  KLONOPIN  Take 1 tablet by mouth in the morning as needed for anxiety and 1 1/2 to 2 tablets by mouth at night as needed for sleep.     EYLEA 2 MG/0.05ML Soln  Generic drug:  Aflibercept  Inject 2 mg into the eye every 5 (five) weeks. Dr. Zadie Rhine     FLUoxetine 20 MG capsule  Commonly known as:  PROZAC  Take 2 capsules (40 mg total) by mouth daily.     furosemide 40 MG tablet  Commonly known as:  LASIX  Take 1 tablet (40 mg total) by mouth daily as needed.     KLOR-CON M20 20 MEQ tablet  Generic drug:  potassium chloride SA  Take 1 tablet by mouth daily as needed. Take it  along with lasix     losartan 100 MG tablet  Commonly known as:  COZAAR  Take 1 tablet (100 mg total) by mouth daily.     metoprolol tartrate 25 MG tablet  Commonly known as:  LOPRESSOR  Take 1 tablet (25 mg total) by mouth 2 (two) times daily.     Vitamin D3 1000 UNITS Caps  Take 1,000 Units by mouth daily.           Objective:   Physical Exam BP 116/68 mmHg  Pulse 66  Temp(Src) 97.8 F (36.6 C) (Oral)  Ht 5\' 2"  (1.575 m)  Wt 134 lb 2 oz (60.839 kg)  BMI 24.53 kg/m2  SpO2 94% General:   Well developed, well nourished . NAD.  HEENT:  Normocephalic . Face symmetric, atraumatic Lungs:  Decreased breath sounds, slightly increase expiration. Normal respiratory effort, no intercostal retractions, no accessory muscle use. Heart: RRR,  no murmur.  no pretibial edema bilaterally  Unable to feel femoral pulses, decreased but palpable right pedal pulse, nonpalpable left pedal pulse, good capillary refills. MSK: Feet with very high arch, no TTP. Abdomen:  Not distended, soft, non-tender. No rebound or rigidity. Palpable, nontender aorta without bruit  Skin: Not pale. Not jaundice Neurologic:  alert & oriented X3.  Speech normal, gait appropriate for age and unassisted Psych--  Cognition and judgment appear intact.  Cooperative with normal attention span and concentration.  Behavior appropriate. No anxious or depressed appearing.    Assessment & Plan:   Assessment>  Hyperglycemia, A1c 5.7 HTN Hyperlipidemia Anxiety depression COPD  DJD CV: --CAD,cath 2012, 2013, CABG 2015 --PAD - last ABIs 2012 --Carotid disease - [place] 04-2015, mod dz, fu 1 year --Central Retinal vein occlusion 02-2015 Increase LFTs, elevated alkaline phosphatase  Thyroid nodule --[place] 2012 stable (since 2009)   PLAN Hyperglycemia: Check A1c HTN: Well-controlled,  recent BMP satisfactory, check a CBC Hyperlipidemia: Seems well-controlled, no change. Anxiety depression: We'll control, will get that  controlled substance contract. PVD: see comments under feet pain, get ABI Palpable aorta, no bruit. Check a [place] COPD: Essentially doing well, no chronic cough, no short of breath with ADLs, not using his nurse CAD: Asymptomatic Feet Pain: Unclear etiology, MSK vs vascular vs neuropathy. Will obtain x-rays, send to Dr. Burt Knack, related to peripheral vascular disease? Consider trial with gabapentin. Will check a [id number] and folic acid Elevated AP: chronic, check a [place] RTC 3 months

## 2015-06-29 NOTE — Patient Instructions (Signed)
Get your blood work before you leave   Stop by the first floor and get the XR      Next visit  for a  3 months, nonfasting  (30  minutes) Please schedule an appointment at the front desk     Fall Prevention and [place] cause injuries and can affect all age groups. It is possible to use preventive measures to significantly decrease the likelihood of falls. There are many simple measures which can make your home safer and prevent falls. OUTDOORS  Repair cracks and edges of walkways and driveways.  Remove high doorway thresholds.  Trim shrubbery on the main path into your home.  Have good outside lighting.  Clear walkways of tools, rocks, debris, and clutter.  Check that handrails are not broken and are securely fastened. Both sides of steps should have handrails.  Have leaves, snow, and ice cleared regularly.  Use sand or salt on walkways during winter months.  In the garage, clean up grease or oil spills. BATHROOM  Install night lights.  Install grab bars by the toilet and in the tub and shower.  Use non-skid mats or decals in the tub or shower.  Place a plastic non-slip stool in the shower to sit on, if needed.  Keep floors dry and clean up all water on the floor immediately.  Remove soap buildup in the tub or shower on a regular basis.  Secure bath mats with non-slip, double-sided rug tape.  Remove throw rugs and tripping hazards from the floors. BEDROOMS  Install night lights.  Make sure a bedside light is easy to reach.  Do not use oversized bedding.  Keep a telephone by your bedside.  Have a firm chair with side arms to use for getting dressed.  Remove throw rugs and tripping hazards from the floor. KITCHEN  Keep handles on pots and pans turned toward the center of the stove. Use back burners when possible.  Clean up spills quickly and allow time for drying.  Avoid walking on wet floors.  Avoid hot utensils and knives.  Position  shelves so they are not too high or low.  Place commonly used objects within easy reach.  If necessary, use a sturdy step stool with a grab bar when reaching.  Keep electrical cables out of the way.  Do not use floor polish or wax that makes floors slippery. If you must use wax, use non-skid floor wax.  Remove throw rugs and tripping hazards from the floor. STAIRWAYS  Never leave objects on stairs.  Place handrails on both sides of stairways and use them. Fix any loose handrails. Make sure handrails on both sides of the stairways are as long as the stairs.  Check carpeting to make sure it is firmly attached along stairs. Make repairs to worn or loose carpet promptly.  Avoid placing throw rugs at the top or bottom of stairways, or properly secure the rug with carpet tape to prevent slippage. Get rid of throw rugs, if possible.  Have an electrician put in a light switch at the top and bottom of the stairs. OTHER FALL PREVENTION TIPS  Wear low-heel or rubber-soled shoes that are supportive and fit well. Wear closed toe shoes.  When using a stepladder, make sure it is fully opened and both spreaders are firmly locked. Do not climb a closed stepladder.  Add color or contrast paint or tape to grab bars and handrails in your home. Place contrasting color strips on first and last  steps.  Learn and use mobility aids as needed. Install an electrical emergency response system.  Turn on lights to avoid dark areas. Replace light bulbs that burn out immediately. Get light switches that glow.  Arrange furniture to create clear pathways. Keep furniture in the same place.  Firmly attach carpet with non-skid or double-sided tape.  Eliminate uneven floor surfaces.  Select a carpet pattern that does not visually hide the edge of steps.  Be aware of all pets. OTHER HOME SAFETY TIPS  Set the water temperature for 120 F (48.8 C).  Keep emergency numbers on or near the telephone.  Keep  smoke detectors on every level of the home and near sleeping areas. Document Released: 07/18/2002 Document Revised: 01/27/2012 Document Reviewed: 10/17/2011 [facility] Patient Information 2015 [place], [place]. This information is not intended to replace advice given to you by your health care provider. Make sure you discuss any questions you have with your health care provider.   Preventive Care for Adults Ages 74 and over  Blood pressure check.** / Every 1 to 2 years.  Lipid and cholesterol check.**/ Every 5 years beginning at age 89.  Lung cancer screening. / Every year if you are aged 32-80 years and have a 30-pack-year history of smoking and currently smoke or have quit within the past 15 years. Yearly screening is stopped once you have quit smoking for at least 15 years or develop a health problem that would prevent you from having lung cancer treatment.  Fecal occult blood test (FOBT) of stool. / Every year beginning at age 67 and continuing until age 60. You may not have to do this test if you get a colonoscopy every 10 years.  Flexible sigmoidoscopy** or colonoscopy.** / Every 5 years for a flexible sigmoidoscopy or every 10 years for a colonoscopy beginning at age 39 and continuing until age 65.  Hepatitis C blood test.** / For all people born from [birth year] through 1966 and any individual with known risks for hepatitis C.  Abdominal aortic aneurysm (AAA) screening.** / A one-time screening for ages 45 to 46 years who are current or former smokers.  Skin self-exam. / Monthly.  Influenza vaccine. / Every year.  Tetanus, diphtheria, and acellular pertussis (Tdap/Td) vaccine.** / 1 dose of Td every 10 years.  Varicella vaccine.** / Consult your health care provider.  Zoster vaccine.** / 1 dose for adults aged 66 years or older.  Pneumococcal 13-valent conjugate (PCV13) vaccine.** / Consult your health care provider.  Pneumococcal polysaccharide (PPSV23) vaccine.** / 1 dose for all  adults aged 11 years and older.  Meningococcal vaccine.** / Consult your health care provider.  Hepatitis A vaccine.** / Consult your health care provider.  Hepatitis B vaccine.** / Consult your health care provider.  Haemophilus influenzae type b (Hib) vaccine.** / Consult your health care provider. **Family history and personal history of risk and conditions may change your health care provider's recommendations. Document Released: 09/23/2001 Document Revised: 08/02/2013 Document Reviewed: 12/23/2010 [facility] Patient Information 2015 [place], [place]. This information is not intended to replace advice given to you by your health care provider. Make sure you discuss any questions you have with your health care provider.

## 2015-06-29 NOTE — Progress Notes (Signed)
Pre visit review using our clinic review tool, if applicable. No additional management support is needed unless otherwise documented below in the visit note. 

## 2015-06-29 NOTE — Telephone Encounter (Signed)
Rx printed, awaiting MD signature.  

## 2015-06-29 NOTE — Telephone Encounter (Signed)
Okay #60 and 3 refills 

## 2015-06-29 NOTE — Telephone Encounter (Signed)
Cologuard order completed online at [facility]. Order number: [id number]. Order confirmation sent to scanning.

## 2015-06-29 NOTE — Assessment & Plan Note (Signed)
Td 08 prevnar 2015 pneumonia shot 07-18-10 shingles immunization--  Rx provided before   Had a flu shot  No further PAPs see previous entry (no vag bleed,dc, rash)   Breast cancer screening-- we agreed on stop screening in 2015   Never had a colonoscopy, still  not interested , no FH, we discussed cologuard vs iFOB; cologuard requested last year but not done, will try again

## 2015-06-29 NOTE — Telephone Encounter (Signed)
Pt is requesting refill on Alprazolam.  Last OV: 03/16/2015, CPE later today (06/29/2015 at 3 PM) Last Fill: 01/15/2015 #60 and 3RF UDS: 08/29/2014 Low risk  Needs contract at CPE.   Please advise.

## 2015-06-30 LAB — VITAMIN B12: VITAMIN B 12: 853 pg/mL (ref 211–911)

## 2015-06-30 LAB — FOLATE

## 2015-07-02 ENCOUNTER — Telehealth: Payer: Self-pay | Admitting: Cardiovascular Disease

## 2015-07-02 DIAGNOSIS — M79606 Pain in leg, unspecified: Secondary | ICD-10-CM

## 2015-07-02 NOTE — Telephone Encounter (Signed)
I spoke with the pt and she saw Dr Larose Kells on 06/29/15 and he recommended vascular testing to further evaluate her foot and leg pain.  The pt has been dealing with this pain for a couple of months. Dr Larose Kells placed an order for ABI but the pt most likely needs a lower arterial duplex performed. I will forward this message to Dr Burt Knack to review and give orders.

## 2015-07-02 NOTE — Telephone Encounter (Signed)
Pt c/o swelling: STAT is pt has developed SOB within 24 hours  1. How long have you been experiencing swelling? Started about 3 month   2. Where is the swelling located? Feet- pain in feet/legs, knee  3.  Are you currently taking a "fluid pill"? No- aspirin every day   4.  Are you currently SOB? no  5.  Have you traveled recently?no

## 2015-07-03 NOTE — Telephone Encounter (Signed)
Agree - would order lower extremity arterial study (duplex). thx

## 2015-07-04 NOTE — Telephone Encounter (Signed)
Pt scheduled for LEA on 07/12/15.

## 2015-07-11 ENCOUNTER — Ambulatory Visit (HOSPITAL_BASED_OUTPATIENT_CLINIC_OR_DEPARTMENT_OTHER)
Admission: RE | Admit: 2015-07-11 | Discharge: 2015-07-11 | Disposition: A | Discharge status: Home / Self Care | Payer: Medicare Other | Source: Ambulatory Visit | Attending: Internal Medicine | Admitting: Internal Medicine

## 2015-07-11 DIAGNOSIS — R945 Abnormal results of liver function studies: Secondary | ICD-10-CM | POA: Diagnosis not present

## 2015-07-11 DIAGNOSIS — N261 Atrophy of kidney (terminal): Secondary | ICD-10-CM | POA: Diagnosis not present

## 2015-07-11 DIAGNOSIS — Q632 Ectopic kidney: Secondary | ICD-10-CM | POA: Diagnosis not present

## 2015-07-11 DIAGNOSIS — R7989 Other specified abnormal findings of blood chemistry: Secondary | ICD-10-CM | POA: Insufficient documentation

## 2015-07-12 ENCOUNTER — Ambulatory Visit (HOSPITAL_COMMUNITY)
Admission: RE | Admit: 2015-07-12 | Discharge: 2015-07-12 | Disposition: A | Discharge status: Home / Self Care | Payer: Medicare Other | Source: Ambulatory Visit | Attending: Cardiovascular Disease | Admitting: Cardiovascular Disease

## 2015-07-12 DIAGNOSIS — E785 Hyperlipidemia, unspecified: Secondary | ICD-10-CM | POA: Insufficient documentation

## 2015-07-12 DIAGNOSIS — I739 Peripheral vascular disease, unspecified: Secondary | ICD-10-CM | POA: Insufficient documentation

## 2015-07-12 DIAGNOSIS — I10 Essential (primary) hypertension: Secondary | ICD-10-CM | POA: Insufficient documentation

## 2015-07-12 DIAGNOSIS — M79606 Pain in leg, unspecified: Secondary | ICD-10-CM

## 2015-07-13 ENCOUNTER — Telehealth: Payer: Self-pay | Admitting: Internal Medicine

## 2015-07-13 MED ORDER — GABAPENTIN 300 MG PO CAPS: 300.0000 mg | ORAL_CAPSULE | Freq: Two times a day (BID) | ORAL | Status: DC

## 2015-07-13 NOTE — Telephone Encounter (Signed)
Please advise 

## 2015-07-13 NOTE — Telephone Encounter (Signed)
Caller name: Rhythm   Relationship to patient: Self   Can be reached:[phone]  Pharmacy: [facility] [place], [place]  Reason for call: Pt says that she is still having pain in her knees and in her feet, she would like to know if she can have a pain medication to help. She says that PCP has seen her for this pain before.   Please advise further.

## 2015-07-13 NOTE — Telephone Encounter (Signed)
Advise patient: Will try gabapentin 300 mg one tablet twice a day. #60 no refills. Recommend to take only 1 tab  at night the first 10 days to get used to it. Arrange office visit 4 weeks from now. If she does not see any improvement in the next 10 days, okay to come back to the office sooner.

## 2015-07-13 NOTE — Telephone Encounter (Signed)
Spoke with Pt, informed her that Gabapentin has been sent to [place] instructed her to take only 1 tablet daily at night for the first 10 days to get use to the medication then she can increase to 2 tablet daily. Pt scheduled F/U appt on 08/14/2015 at 2:15 PM. Informed her to let us know if not improving in several days. Pt verbalized understanding.

## 2015-07-18 ENCOUNTER — Other Ambulatory Visit: Payer: Self-pay

## 2015-07-18 DIAGNOSIS — R6889 Other general symptoms and signs: Secondary | ICD-10-CM

## 2015-07-18 NOTE — Telephone Encounter (Signed)
Noted  

## 2015-07-18 NOTE — Telephone Encounter (Signed)
Pt requested Cologuard cancellation. Just an FYI.

## 2015-07-19 ENCOUNTER — Telehealth: Payer: Self-pay | Admitting: Internal Medicine

## 2015-07-19 NOTE — Telephone Encounter (Signed)
Pharmacy: [facility] [place], [place]  Reason for call: Pt asking if pain meds can be called in for her due to lower extremity pain (ongoing issue). She said she is taking the gabapentin but it does not help anymore.

## 2015-07-20 MED ORDER — HYDROCODONE-ACETAMINOPHEN 5-325 MG PO TABS: 1.0000 | ORAL_TABLET | Freq: Three times a day (TID) | ORAL | Status: DC | PRN

## 2015-07-20 NOTE — Telephone Encounter (Signed)
Okay, I printed a prescription for Vicodin, watch for drowsiness. I still think that increase gabapentin will help. Please ask her to reconsider. If pain is not improving let me know

## 2015-07-20 NOTE — Telephone Encounter (Signed)
Please advise 

## 2015-07-20 NOTE — Telephone Encounter (Signed)
Recommend to increase gabapentin 300 mg twice a day to 600 mg twice a day. Send a new prescription. We can also prescribed Vicodin as needed. If she is interested let me know. Needs to see cardiology as recommended. If they feel the pain is not from vascular disease, will need further evaluation for possibly spinal stenosis.

## 2015-07-20 NOTE — Telephone Encounter (Signed)
Rx placed at front desk for pick up.  

## 2015-07-20 NOTE — Telephone Encounter (Signed)
Pt would like to try Vicodin instead of increasing Gabapentin. Pt states she has an appt with Dr. Burt Knack in March and will discuss pains with him then. Please advise.

## 2015-08-14 ENCOUNTER — Ambulatory Visit (INDEPENDENT_AMBULATORY_CARE_PROVIDER_SITE_OTHER): Payer: Medicare Other | Admitting: Internal Medicine

## 2015-08-14 ENCOUNTER — Encounter: Payer: Self-pay | Admitting: Internal Medicine

## 2015-08-14 VITALS — BP 122/76 | HR 55 | Temp 97.5°F | Ht 62.0 in | Wt 135.4 lb

## 2015-08-14 DIAGNOSIS — M79671 Pain in right foot: Secondary | ICD-10-CM | POA: Diagnosis not present

## 2015-08-14 DIAGNOSIS — I739 Peripheral vascular disease, unspecified: Secondary | ICD-10-CM

## 2015-08-14 DIAGNOSIS — M79672 Pain in left foot: Secondary | ICD-10-CM | POA: Diagnosis not present

## 2015-08-14 MED ORDER — GABAPENTIN 300 MG PO CAPS: 300.0000 mg | ORAL_CAPSULE | Freq: Two times a day (BID) | ORAL | Status: DC

## 2015-08-14 NOTE — Patient Instructions (Signed)
BEFORE YOU LEAVE THE OFFICE:    Schedule a routine office visit or check up to be done in  4 months  No fasting

## 2015-08-14 NOTE — Progress Notes (Signed)
Patient ID: Patricia [name], female    DOB: 1941/04/06, 75 y.o.   MRN: [id number]  DOS:  08/14/2015 Type of visit - description : follow-up from previous visit Interval history: her chief complaint was bilateral foot pain, since then ABIs show right-sided obstruction, labs were unremarkable. She also had a ultrasound of the abdomen for increased LFTs on palpable aorta: Normal. Overall, she feels better since she is starting taking gabapentin.. From time to time has pain again, takes hydrocodone then pain resolves.  Review of Systems  denies nausea, vomiting, diarrhea. No back pain No classic claudication.  Past Medical History  Diagnosis Date  . Osteopenia   . COPD (chronic obstructive pulmonary disease) ([place])   . Hyperlipidemia   . Thyroid nodule   . Abnormal LFTs   . Anxiety and depression     son commited suicide 2010  . HTN (hypertension)     intolerant to ACEi (cough)  . Allergic rhinitis   . Panic attacks   . PAD (peripheral artery disease) ([place])     Carotid dz as  below, and at [facility] 11/12:  critical stenosis noted just below the sheath site leading into a totally occluded SFA and a patent deep femoral artery  . Carotid artery disease ([place])     Moderate - f/u dopplers needed 11/2012  . CAD (coronary artery disease)     s/p CABG  . 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 Zadie Rhine    Past Surgical History  Procedure Laterality Date  . Coronary angioplasty with stent placement      "1; makes total of 3" (07/05/2012)  . Vaginal hysterectomy  ~ 1976    no oophorectomy  . 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 Coronary artery bypass graft N/A 12/15/2013    Procedure: CORONARY ARTERY BYPASS GRAFTING (CABG);  Surgeon: Gaye Pollack, MD;  Location: [facility];  Service: Open Heart  Surgery;  Laterality: N/A;  Times 3 using left internal mammary artery and endoscopically harvested right saphenous vein   . Intraoperative transesophageal echocardiogram N/A 12/15/2013    Procedure: INTRAOPERATIVE TRANSESOPHAGEAL ECHOCARDIOGRAM;  Surgeon: Gaye Pollack, MD;  Location: [facility] OR;  Service: Open Heart Surgery;  Laterality: N/A;  . Left heart catheterization with coronary angiogram N/A 07/02/2011    Procedure: LEFT HEART CATHETERIZATION WITH CORONARY ANGIOGRAM;  Surgeon: Sherren Mocha, MD;  Location: [facility] CATH LAB;  Service: Cardiovascular;  Laterality: N/A;  . Percutaneous coronary intervention-balloon only  07/02/2011    Procedure: PERCUTANEOUS CORONARY INTERVENTION-BALLOON ONLY;  Surgeon: Sherren Mocha, MD;  Location: [facility] CATH LAB;  Service: Cardiovascular;;  . Left heart catheterization with coronary angiogram N/A 07/05/2012    Procedure: LEFT HEART CATHETERIZATION WITH CORONARY ANGIOGRAM;  Surgeon: Sherren Mocha, MD;  [facility] C[facility] LAB;  Service: Cardiovascular;  Laterality: N/A;  . Percutaneous coronary intervention-balloon only  07/05/2012    Procedure: PERCUTANEOUS CORONARY INTERVENTION-BALLOON ONLY;  Surgeon: Sherren Mocha, MD;  Location: [facility] CATH LAB;  Service: Cardiovascular;;  . Left heart catheterization with coronary angiogram N/A 11/28/2013    Procedure: LEFT HEART CATHETERIZATION WITH CORONARY ANGIOGRAM;  Surgeon: Blane Ohara, MD;  Location: [facility] CATH LAB;  Service: Cardiovascular;  Laterality: N/A;    Social  History   Social History  . Marital Status: Widowed    Spouse Name: N/A  . Number of Children: 1  . Years of Education: N/A   Occupational History  . Retired-2003    Social History Main Topics  . Smoking status: Former Smoker -- 0.50 packs/day for 54 years    Types: Cigarettes    Quit date: 12/15/2013  . Smokeless tobacco: Never Used     Comment: quit tobacco 12-2013 after CABG  . Alcohol Use: No  . Drug Use: No  . Sexual Activity: No   Other  Topics Concern  . Not on file   Social History Narrative   G son lives w/ her    HC-POA -- sister's daughter         Medication List       This list is accurate as of: 08/14/15  6:46 PM.  Always use your most recent med list.               amLODipine 5 MG tablet  Commonly known as:  NORVASC  Take 1 tablet (5 mg total) by mouth daily.     aspirin 325 MG EC tablet  Take 1 tablet (325 mg total) by mouth daily.     atorvastatin 40 MG tablet  Commonly known as:  LIPITOR  Take 1 tablet (40 mg total) by mouth at bedtime.     clonazePAM 0.5 MG tablet  Commonly known as:  KLONOPIN  Take 1 tablet by mouth in the morning as needed for anxiety and 1 1/2 to 2 tablets by mouth at night as needed for sleep.     EYLEA 2 MG/0.05ML Soln  Generic drug:  Aflibercept  Inject 2 mg into the eye every 5 (five) weeks. Dr. Zadie Rhine     FLUoxetine 20 MG capsule  Commonly known as:  PROZAC  Take 2 capsules (40 mg total) by mouth daily.     furosemide 40 MG tablet  Commonly known as:  LASIX  Take 1 tablet (40 mg total) by mouth daily as needed.     gabapentin 300 MG capsule  Commonly known as:  NEURONTIN  Take 1 capsule (300 mg total) by mouth 2 (two) times daily.     HYDROcodone-acetaminophen 5-325 MG tablet  Commonly known as:  NORCO/VICODIN  Take 1-2 tablets by mouth every 8 (eight) hours as needed.     KLOR-CON M20 20 MEQ tablet  Generic drug:  potassium chloride SA  Take 1 tablet by mouth daily as needed. Take it along with lasix     losartan 100 MG tablet  Commonly known as:  COZAAR  Take 1 tablet (100 mg total) by mouth daily.     metoprolol tartrate 25 MG tablet  Commonly known as:  LOPRESSOR  Take 1 tablet (25 mg total) by mouth 2 (two) times daily.     Vitamin D3 1000 units Caps  Take 1,000 Units by mouth daily.           Objective:   Physical Exam BP 122/76 mmHg  Pulse 55  Temp(Src) 97.5 F (36.4 C) (Oral)  Ht 5\' 2"  (1.575 m)  Wt 135 lb 6 oz (61.406 kg)  BMI  24.75 kg/m2  SpO2 96% General:   Well developed, well nourished . NAD.  HEENT:  Normocephalic . Face symmetric, atraumatic Lungs:  CTA B Normal respiratory effort, no intercostal retractions, no accessory muscle use. Heart: RRR,  no murmur.  No pretibial edema bilaterally  Skin: Not  pale. Not jaundice Neurologic:  alert & oriented X3.  Speech normal, gait appropriate for age and unassisted Psych--  Cognition and judgment appear intact.  Cooperative with normal attention span and concentration.  Behavior appropriate. No anxious or depressed appearing.      Assessment & Plan:   Assessment>  Hyperglycemia, A1c 5.7 HTN Hyperlipidemia Anxiety depression COPD  DJD CV: --CAD,cath 2012, 2013, CABG 2015 --PAD - last ABIs 2012 --Carotid disease - [place] 04-2015, mod dz, fu 1 year --Central Retinal vein occlusion 02-2015 --Palpable aorta US abdomen 06-2015 no AA   Increase LFTs, elevated alkaline phosphatase , [place] abd neg 06-2015  Thyroid nodule --[place] 2012 stable (since 2009)   PLAN PVD: Recent ABIs show right-sided vascular disease, has appointment to see Dr. Burt Knack in few weeks Palpable aorta: Ultrasound did not show a AAA Feet pain: Since the last time she was here pain has definitely decreased, related to initiation of gabapentin?. She probably has some degree of neuropathy or spinal stenosis.. [id number] and folic acid were normal. Continue gabapentin, hydrocodone as needed. At this point no further intervention is needed RTC 4 months

## 2015-08-14 NOTE — Progress Notes (Signed)
Pre visit review using our clinic review tool, if applicable. No additional management support is needed unless otherwise documented below in the visit note. 

## 2015-09-10 DIAGNOSIS — H35352 Cystoid macular degeneration, left eye: Secondary | ICD-10-CM | POA: Diagnosis not present

## 2015-09-10 DIAGNOSIS — H34812 Central retinal vein occlusion, left eye, with macular edema: Secondary | ICD-10-CM | POA: Diagnosis not present

## 2015-09-20 ENCOUNTER — Ambulatory Visit: Payer: Self-pay | Admitting: Internal Medicine

## 2015-10-03 ENCOUNTER — Other Ambulatory Visit: Payer: Self-pay | Admitting: Internal Medicine

## 2015-10-16 ENCOUNTER — Other Ambulatory Visit: Payer: Self-pay | Admitting: Cardiovascular Disease

## 2015-10-22 ENCOUNTER — Ambulatory Visit (INDEPENDENT_AMBULATORY_CARE_PROVIDER_SITE_OTHER): Payer: Medicare Other | Admitting: Cardiovascular Disease

## 2015-10-22 ENCOUNTER — Encounter: Payer: Self-pay | Admitting: Cardiovascular Disease

## 2015-10-22 VITALS — BP 142/68 | HR 42 | Ht 62.5 in | Wt 134.4 lb

## 2015-10-22 DIAGNOSIS — R001 Bradycardia, unspecified: Secondary | ICD-10-CM | POA: Diagnosis not present

## 2015-10-22 DIAGNOSIS — I2510 Atherosclerotic heart disease of native coronary artery without angina pectoris: Secondary | ICD-10-CM

## 2015-10-22 DIAGNOSIS — E785 Hyperlipidemia, unspecified: Secondary | ICD-10-CM | POA: Diagnosis not present

## 2015-10-22 MED ORDER — ASPIRIN 81 MG PO TBEC: 81.0000 mg | DELAYED_RELEASE_TABLET | Freq: Every day | ORAL | Status: DC

## 2015-10-22 NOTE — Patient Instructions (Addendum)
Medication Instructions:  Your physician has recommended you make the following change in your medication:  1. DECREASE Aspirin to 81mg  take one tablet by mouth daily 2. DECREASE Metoprolol Tartrate to one tablet daily for 1 WEEK and then STOP  Labwork: No new orders.   Testing/Procedures: No new orders.   Follow-Up: Your physician wants you to follow-up in: 6 MONTHS with Dr Burt Knack.  You will receive a reminder letter in the mail two months in advance. If you don't receive a letter, please call our office to schedule the follow-up appointment.   Any Other Special Instructions Will Be Listed Below (If Applicable).     If you need a refill on your cardiac medications before your next appointment, please call your pharmacy.

## 2015-10-22 NOTE — Progress Notes (Signed)
Cardiology Office Note Date:  10/22/2015   ID:  Patricia [name] 10-04-1940, MRN [id number]  PCP:  Kathlene November, MD  Cardiologist:  Sherren Mocha, MD    Chief Complaint  Patient presents with  . routine office visit    A Fib. Denies any chest pain, sob, lee, or claudication   History of Present Illness: Patricia [name] is a 75 y.o. female who presents for follow-up of CAD. She has a long history of CAD and has undergone multiple PCI procedures. 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 She ultimately underwent multivessel CABG in 2015 and has had complete resolution of her angina since undergoing CABG.   The patient had vision loss in her left eye. She underwent ophthalmologic evaluation and was found to have a retinal hemorrhage. She is working on treatment of this with her ophthalmologist. Otherwise she is doing quite well. She's had no recurrence of angina since CABG. She does have some lightheadedness and gait unsteadiness, but no presyncope or frank syncope. She denies chest pain, chest pressure, leg swelling, orthopnea, PND, or heart palpitations. She denies shortness of breath.  Past Medical History  Diagnosis Date  . Osteopenia   . COPD (chronic obstructive pulmonary disease) ([place])   . Hyperlipidemia   . Thyroid nodule   . Abnormal LFTs   . Anxiety and depression     son commited suicide 2010  . HTN (hypertension)     intolerant to ACEi (cough)  . Allergic rhinitis   . Panic attacks   . PAD (peripheral artery disease) ([place])     Carotid dz as  below, and at [facility] 11/12:  critical stenosis noted just below the sheath site leading into a totally occluded SFA and a patent deep femoral artery  . Carotid artery disease ([place])     Moderate - f/u dopplers needed 11/2012  . CAD (coronary artery disease)     s/p CABG  . 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 Central retinal  vein occlusion of left eye 02/2015    Receiving injections in eye every 5 weeks by Dr. Zadie Rhine    Past Surgical History  Procedure Laterality Date  . Coronary angioplasty with stent placement      "1; makes total of 3" (07/05/2012)  . Vaginal hysterectomy  ~ 1976    no oophorectomy  . 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 Coronary artery bypass graft N/A 12/15/2013    Procedure: CORONARY ARTERY BYPASS GRAFTING (CABG);  Surgeon: Gaye Pollack, MD;  Location: [place];  Service: Open Heart Surgery;  Laterality: N/A;  Times 3 using left internal mammary artery and endoscopically harvested right saphenous vein   . Intraoperative transesophageal echocardiogram N/A 12/15/2013    Procedure: INTRAOPERATIVE TRANSESOPHAGEAL ECHOCARDIOGRAM;  Surgeon: Gaye Pollack, MD;  [facility];  Service: Open Heart Surgery;  Laterality: N/A;  . Left heart catheterization with coronary angiogram N/A 07/02/2011    Procedure: LEFT HEART CATHETERIZATION WITH CORONARY ANGIOGRAM;  Surgeon: Sherren Mocha, MD;  Location: [facility] CATH LAB;  Service: Cardiovascular;  Laterality: N/A;  . Percutaneous coronary intervention-balloon only  07/02/2011    Procedure: PERCUTANEOUS CORONARY INTERVENTION-BALLOON ONLY;  Surgeon: Sherren Mocha, MD;  Location: [facility] CATH LAB;  Service: Cardiovascular;;  . Left  heart catheterization with coronary angiogram N/A 07/05/2012    Procedure: LEFT HEART CATHETERIZATION WITH CORONARY ANGIOGRAM;  Surgeon: Sherren Mocha, MD;  Location: [facility] CATH LAB;  Service: Cardiovascular;  Laterality: N/A;  . Percutaneous coronary intervention-balloon only  07/05/2012    Procedure: PERCUTANEOUS CORONARY INTERVENTION-BALLOON ONLY;  Surgeon: Sherren Mocha, MD;  Location: [facility] CATH LAB;  Service: Cardiovascular;;  . Left heart catheterization with coronary angiogram N/A 11/28/2013    Procedure: LEFT HEART CATHETERIZATION WITH  CORONARY ANGIOGRAM;  Surgeon: Blane Ohara, MD;  Location: [facility] CATH LAB;  Service: Cardiovascular;  Laterality: N/A;    Current Outpatient Prescriptions  Medication Sig Dispense Refill  . Aflibercept (EYLEA) 2 MG/0.05ML SOLN Inject 2 mg into the eye every 5 (five) weeks. Dr. Zadie Rhine    . amLODipine (NORVASC) 5 MG tablet Take 1 tablet (5 mg total) by mouth daily. 30 tablet 8  . aspirin 81 MG EC tablet Take 1 tablet (81 mg total) by mouth daily.    Marland Kitchen atorvastatin (LIPITOR) 40 MG tablet Take 1 tablet (40 mg total) by mouth at bedtime. 90 tablet 2  . Cholecalciferol (VITAMIN D3) 1000 UNITS CAPS Take 1,000 Units by mouth daily.    . clonazePAM (KLONOPIN) 0.5 MG tablet Take 1 tablet by mouth in the morning as needed for anxiety and 1 1/2 to 2 tablets by mouth at night as needed for sleep. 60 tablet 3  . FLUoxetine (PROZAC) 20 MG capsule Take 2 capsules (40 mg total) by mouth daily. 60 capsule 3  . furosemide (LASIX) 40 MG tablet Take 40 mg by mouth daily as needed for edema.    . gabapentin (NEURONTIN) 300 MG capsule Take 1 capsule (300 mg total) by mouth 2 (two) times daily. 60 capsule 6  . KLOR-CON M20 20 MEQ tablet Take 1 tablet by mouth daily as needed. Take it along with lasix    . losartan (COZAAR) 100 MG tablet TAKE ONE TABLET BY MOUTH ONCE DAILY 90 tablet 0   No current facility-administered medications for this visit.   Allergies:   Ace inhibitors   Social History:  The patient  reports that she quit smoking about 22 months ago. Her smoking use included Cigarettes. She has a 27 pack-year smoking history. She has never used smokeless tobacco. She reports that she does not drink alcohol or use illicit drugs.   Family History:  The patient's  family history includes Coronary artery disease in her father; Healthy in her grandchild; Heart Problems (age of onset: 9) in her sister; Heart attack in her father; Heart attack (age of onset: 48) in her brother; Hypertension in her mother; Stroke in  her mother; Suicidality (age of onset: 70) in her son. There is no history of Colon cancer or Breast cancer.   ROS:  Please see the history of present illness.  Otherwise, review of systems is positive for visual disturbance (retinal hemorrhage on the left), .  All other systems are reviewed and negative.   PHYSICAL EXAM: VS:  BP 142/68 mmHg  Pulse 42  Ht 5' 2.5" (1.588 m)  Wt 134 lb 6.4 oz (60.963 kg)  BMI 24.17 kg/m2 , BMI Body mass index is 24.17 kg/(m^2). GEN: Well nourished, well developed, in no acute distress HEENT: normal Neck: no JVD, no masses. No carotid bruits Cardiac: bradycardic and regular without murmur or gallop   Respiratory:  clear to auscultation bilaterally, normal work of breathing, prolonged expiratory phase GI: soft, nontender, nondistended, + BS MS: no deformity or atrophy  Ext: no pretibial edema Skin: warm and dry, no rash Neuro:  Strength and sensation are intact Psych: euthymic mood, full affect  EKG:  EKG is ordered today. The ekg ordered today shows marked sinus bradycardia 49 bpm, ST and T wave abnormality consider lateral ischemia  Recent Labs: 04/18/2015: ALT 21; BUN 13; Creatinine, Ser 0.61; Potassium 4.6; Sodium 139 06/29/2015: Hemoglobin 13.9; Platelets 324   Lipid Panel     Component Value Date/Time   CHOL 150 04/18/2015 1356   TRIG 76.0 04/18/2015 1356   HDL 60.80 04/18/2015 1356   CHOLHDL 2 04/18/2015 1356   VLDL 15.2 04/18/2015 1356   LDLCALC 74 04/18/2015 1356   LDLDIRECT 156.0 04/26/2007 0936   Wt Readings from Last 3 Encounters:  10/22/15 134 lb 6.4 oz (60.963 kg)  08/14/15 135 lb 6 oz (61.406 kg)  06/29/15 134 lb 2 oz (60.839 kg)    ASSESSMENT AND PLAN: 1.  CAD, native vessel, without symptoms of angina: Recommend decrease aspirin 81 mg. Patient is doing well. I will see her back in 6 months.  2. Symptomatic bradycardia: Recommend decrease metoprolol to 25 mg once daily 1 week, then stop. No other evidence conduction system  disease with a normal PR interval and normal QRS duration.  3. Essential hypertension: Blood pressure is controlled on amlodipine, metoprolol, losartan. Metoprolol be stopped because of bradycardia.  4. Hyperlipidemia: Most recent lipids reviewed. Continue atorvastatin 40 mg.  Current medicines are reviewed with the patient today.  The patient does not have concerns regarding medicines.  Labs/ tests ordered today include:   Orders Placed This Encounter  Procedures  . EKG 12-Lead   Disposition:   FU 6 months  Signed, Sherren Mocha, MD  10/22/2015 5:35 PM    [facility] [address], [place], [place]  [address] Phone: [phone]; Fax: [phone]

## 2015-10-31 DIAGNOSIS — H348322 Tributary (branch) retinal vein occlusion, left eye, stable: Secondary | ICD-10-CM | POA: Diagnosis not present

## 2015-10-31 DIAGNOSIS — H40013 Open angle with borderline findings, low risk, bilateral: Secondary | ICD-10-CM | POA: Diagnosis not present

## 2015-11-01 ENCOUNTER — Other Ambulatory Visit: Payer: Self-pay | Admitting: Internal Medicine

## 2015-12-10 DIAGNOSIS — H35042 Retinal micro-aneurysms, unspecified, left eye: Secondary | ICD-10-CM | POA: Diagnosis not present

## 2015-12-10 DIAGNOSIS — H2512 Age-related nuclear cataract, left eye: Secondary | ICD-10-CM | POA: Diagnosis not present

## 2015-12-10 DIAGNOSIS — H34812 Central retinal vein occlusion, left eye, with macular edema: Secondary | ICD-10-CM | POA: Diagnosis not present

## 2015-12-10 DIAGNOSIS — H35352 Cystoid macular degeneration, left eye: Secondary | ICD-10-CM | POA: Diagnosis not present

## 2015-12-10 DIAGNOSIS — H2511 Age-related nuclear cataract, right eye: Secondary | ICD-10-CM | POA: Diagnosis not present

## 2015-12-12 ENCOUNTER — Encounter: Payer: Self-pay | Admitting: Internal Medicine

## 2015-12-12 ENCOUNTER — Ambulatory Visit (INDEPENDENT_AMBULATORY_CARE_PROVIDER_SITE_OTHER): Payer: Medicare Other | Admitting: Internal Medicine

## 2015-12-12 VITALS — BP 118/70 | HR 77 | Temp 98.0°F | Ht 62.5 in | Wt 134.0 lb

## 2015-12-12 DIAGNOSIS — I2510 Atherosclerotic heart disease of native coronary artery without angina pectoris: Secondary | ICD-10-CM

## 2015-12-12 DIAGNOSIS — M25521 Pain in right elbow: Secondary | ICD-10-CM

## 2015-12-12 DIAGNOSIS — I739 Peripheral vascular disease, unspecified: Secondary | ICD-10-CM | POA: Diagnosis not present

## 2015-12-12 DIAGNOSIS — I10 Essential (primary) hypertension: Secondary | ICD-10-CM | POA: Diagnosis not present

## 2015-12-12 DIAGNOSIS — E785 Hyperlipidemia, unspecified: Secondary | ICD-10-CM | POA: Diagnosis not present

## 2015-12-12 DIAGNOSIS — M79621 Pain in right upper arm: Secondary | ICD-10-CM | POA: Diagnosis not present

## 2015-12-12 DIAGNOSIS — Z09 Encounter for follow-up examination after completed treatment for conditions other than malignant neoplasm: Secondary | ICD-10-CM

## 2015-12-12 NOTE — Patient Instructions (Signed)
GO TO THE LAB :      Get the blood work     GO TO THE FRONT DESK  Schedule your next appointment for a  routine checkup in 4-5 months. Fasting.

## 2015-12-12 NOTE — Progress Notes (Signed)
Subjective:    Patient ID: Patricia [name], female    DOB: 06-14-1941, 75 y.o.   MRN: [id number]  DOS:  12/12/2015 Type of visit - description :  Interval history: Cardiology note reviewed: Aspirin dose decreased, metoprolol dose decreased due to bradycardia, felt to be stable Leg pain: Essentially resolved even after she discontinue gabapentin. 2 months ago developed pain at the right arm, no rash, no injury, no neck pain.    Review of Systems  denies claudication  No anxiety or depression at this point.  Past Medical History  Diagnosis Date  . Osteopenia   . COPD (chronic obstructive pulmonary disease) ([place])   . Hyperlipidemia   . Thyroid nodule   . Abnormal LFTs   . Anxiety and depression     son commited suicide 2010  . HTN (hypertension)     intolerant to ACEi (cough)  . Allergic rhinitis   . Panic attacks   . PAD (peripheral artery disease) ([place])     Carotid dz as  below, and at [facility] 11/12:  critical stenosis noted just below the sheath site leading into a totally occluded SFA and a patent deep femoral artery  . Carotid artery disease ([place])     Moderate - f/u dopplers needed 11/2012  . CAD (coronary artery disease)     s/p CABG  . 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 Zadie Rhine    Past Surgical History  Procedure Laterality Date  . Coronary angioplasty with stent placement      "1; makes total of 3" (07/05/2012)  . Vaginal hysterectomy  ~ 1976    no oophorectomy  . 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 Coronary artery bypass graft N/A 12/15/2013    Procedure: CORONARY ARTERY BYPASS GRAFTING (CABG);  Surgeon: Gaye Pollack, MD;  Location: [place];  Service: Open Heart Surgery;  Laterality: N/A;  Times 3 using left internal mammary artery and endoscopically harvested right  saphenous vein   . Intraoperative transesophageal echocardiogram N/A 12/15/2013    Procedure: INTRAOPERATIVE TRANSESOPHAGEAL ECHOCARDIOGRAM;  Surgeon: Gaye Pollack, MD;  Location: [facility] OR;  Service: Open Heart Surgery;  Laterality: N/A;  . Left heart catheterization with coronary angiogram N/A 07/02/2011    Procedure: LEFT HEART CATHETERIZATION WITH CORONARY ANGIOGRAM;  Surgeon: Sherren Mocha, MD;  Location: [facility] CATH LAB;  Service: Cardiovascular;  Laterality: N/A;  . Percutaneous coronary intervention-balloon only  07/02/2011    Procedure: PERCUTANEOUS CORONARY INTERVENTION-BALLOON ONLY;  Surgeon: Sherren Mocha, MD;  Location: [facility] CATH LAB;  Service: Cardiovascular;;  . Left heart catheterization with coronary angiogram N/A 07/05/2012    Procedure: LEFT HEART CATHETERIZATION WITH CORONARY ANGIOGRAM;  Surgeon: Sherren Mocha, MD;  Location: [facility] CATH LAB;  Service: Cardiovascular;  Laterality: N/A;  . Percutaneous coronary intervention-balloon only  07/05/2012    Procedure: PERCUTANEOUS CORONARY INTERVENTION-BALLOON ONLY;  Surgeon: Sherren Mocha, MD;  Location: [facility] CATH LAB;  Service: Cardiovascular;;  . Left heart catheterization with coronary angiogram N/A 11/28/2013    Procedure: LEFT HEART CATHETERIZATION WITH CORONARY ANGIOGRAM;  Surgeon: Blane Ohara, MD;  Location: [facility] CATH LAB;  Service: Cardiovascular;  Laterality: N/A;    Social History   Social History  . Marital Status: Widowed    Spouse  Name: N/A  . Number of Children: 1  . Years of Education: N/A   Occupational History  . Retired-2003    Social History Main Topics  . Smoking status: Former Smoker -- 0.50 packs/day for 54 years    Types: Cigarettes    Quit date: 12/15/2013  . Smokeless tobacco: Never Used     Comment: quit tobacco 12-2013 after CABG  . Alcohol Use: No  . Drug Use: No  . Sexual Activity: No   Other Topics Concern  . Not on file   Social History Narrative   G son lives w/ her    HC-POA -- sister's  daughter         Medication List       This list is accurate as of: 12/12/15 11:59 PM.  Always use your most recent med list.               amLODipine 5 MG tablet  Commonly known as:  NORVASC  Take 1 tablet (5 mg total) by mouth daily.     aspirin 81 MG EC tablet  Take 1 tablet (81 mg total) by mouth daily.     atorvastatin 40 MG tablet  Commonly known as:  LIPITOR  Take 1 tablet (40 mg total) by mouth at bedtime.     clonazePAM 0.5 MG tablet  Commonly known as:  KLONOPIN  Take 1 tablet by mouth in the morning as needed for anxiety and 1 1/2 to 2 tablets by mouth at night as needed for sleep.     EYLEA 2 MG/0.05ML Soln  Generic drug:  Aflibercept  Inject 2 mg into the eye every 5 (five) weeks. Dr. Zadie Rhine     FLUoxetine 20 MG capsule  Commonly known as:  PROZAC  Take 2 capsules (40 mg total) by mouth daily.     furosemide 40 MG tablet  Commonly known as:  LASIX  Take 40 mg by mouth daily as needed for edema.     KLOR-CON M20 20 MEQ tablet  Generic drug:  potassium chloride SA  Take 1 tablet by mouth daily as needed. Take it along with lasix     losartan 100 MG tablet  Commonly known as:  COZAAR  TAKE ONE TABLET BY MOUTH ONCE DAILY     Vitamin D3 1000 units Caps  Take 1,000 Units by mouth daily.           Objective:   Physical Exam  Musculoskeletal:       Arms:  BP 118/70 mmHg  Pulse 77  Temp(Src) 98 F (36.7 C) (Oral)  Ht 5' 2.5" (1.588 m)  Wt 134 lb (60.782 kg)  BMI 24.10 kg/m2  SpO2 99% General:   Well developed, well nourished . NAD.  HEENT:  Normocephalic . Face symmetric, atraumatic Lungs:  Decreased breath sounds but clear Normal respiratory effort, no intercostal retractions, no accessory muscle use. Heart: RRR,  no murmur.  No pretibial edema bilaterally  Skin: Not pale. Not jaundice Neurologic:  alert & oriented X3.  Speech normal, gait appropriate for age and unassisted Psych--  Cognition and judgment appear intact.    Cooperative with normal attention span and concentration.  Behavior appropriate. No anxious or depressed appearing.      Assessment & Plan:   Assessment>  Hyperglycemia, A1c 5.7 HTN Hyperlipidemia Anxiety depression COPD , quit tobacco 2015 DJD CV: --CAD,cath 2012, 2013, CABG 2015 --PAD -   ABIs 2012, 07-2015 : Stable moderate disease left, normal right  --  Carotid disease - [place] 04-2015, mod dz, fu 1 year --Central Retinal vein occlusion 02-2015 --Palpable aorta US abdomen 06-2015 no AA   Increase LFTs, elevated alkaline phosphatase , [place] abd neg 06-2015  Thyroid nodule --[place] 2012 stable (since 2009)   PLAN HTN: Seems well-controlled, continue same meds, check a BMP Hyperlipidemia: Well-controlled Anxiety depression: Continue same medications, asymptomatic PVD, feet pain: see previous notes, she is not taking gabapentin any longer and remains  Asx thus  no further intervention at this time Arm pain: Etiology unclear, refer to sports medicine, for further evaluation, soft tissue ultrasound? RTC 4-5 months.

## 2015-12-12 NOTE — Progress Notes (Signed)
Pre visit review using our clinic review tool, if applicable. No additional management support is needed unless otherwise documented below in the visit note. 

## 2015-12-13 DIAGNOSIS — Z09 Encounter for follow-up examination after completed treatment for conditions other than malignant neoplasm: Secondary | ICD-10-CM | POA: Insufficient documentation

## 2015-12-13 LAB — BASIC METABOLIC PANEL
BUN: 15 mg/dL (ref 6–23)
CHLORIDE: 101 meq/L (ref 96–112)
CO2: 31 meq/L (ref 19–32)
Calcium: 9.6 mg/dL (ref 8.4–10.5)
Creatinine, Ser: 0.61 mg/dL (ref 0.40–1.20)
GFR: 101.41 mL/min (ref >60.00)
Glucose, Bld: 75 mg/dL (ref 70–99)
Potassium: 3.8 mEq/L (ref 3.5–5.1)
SODIUM: 140 meq/L (ref 135–145)

## 2015-12-13 NOTE — Assessment & Plan Note (Signed)
HTN: Seems well-controlled, continue same meds, check a BMP Hyperlipidemia: Well-controlled Anxiety depression: Continue same medications, asymptomatic PVD, feet pain: see previous notes, she is not taking gabapentin any longer and remains  Asx thus  no further intervention at this time Arm pain: Etiology unclear, refer to sports medicine, for further evaluation, soft tissue ultrasound? RTC 4-5 months.

## 2015-12-25 ENCOUNTER — Encounter: Payer: Self-pay | Admitting: Family Medicine

## 2015-12-25 ENCOUNTER — Ambulatory Visit (INDEPENDENT_AMBULATORY_CARE_PROVIDER_SITE_OTHER): Payer: Medicare Other | Admitting: Family Medicine

## 2015-12-25 VITALS — BP 117/73 | Ht 62.0 in | Wt 132.0 lb

## 2015-12-25 DIAGNOSIS — I2510 Atherosclerotic heart disease of native coronary artery without angina pectoris: Secondary | ICD-10-CM

## 2015-12-25 DIAGNOSIS — M25511 Pain in right shoulder: Secondary | ICD-10-CM

## 2015-12-25 NOTE — Patient Instructions (Signed)
You have rotator cuff impingement Try to avoid painful activities (overhead activities, lifting with extended arm) as much as possible if painful. Aleve 2 tabs twice a day with food OR ibuprofen 3 tabs three times a day with food for pain and inflammation only if needed. Can take tylenol in addition to this. Subacromial injection may be beneficial to help with pain and to decrease inflammation. Consider physical therapy with transition to home exercise program. Do home exercise program with theraband and scapular stabilization exercises daily - these are very important for long term relief even if an injection was given.  3 sets of 10 once a day for next 6 weeks. If not improving at follow-up we will consider further imaging, injection, physical therapy, and/or nitro patches. Follow up with me in 6 weeks for reevaluation.

## 2015-12-26 DIAGNOSIS — M25511 Pain in right shoulder: Secondary | ICD-10-CM | POA: Insufficient documentation

## 2015-12-26 NOTE — Assessment & Plan Note (Signed)
2/2 rotator cuff impingement.  Shown home exercises to do daily.  NSAIDs as needed.  Consider injection, physical therapy, nitro patches if not improving.  F/u in 6 weeks.

## 2015-12-26 NOTE — Progress Notes (Signed)
PCP: Kathlene November, MD  Subjective:   HPI: Patient is a 75 y.o. female here for right arm pain.  Patient reports she's had about 2-3 months of lateral right arm pain. Difficulty reaching behind her back. Pain is now 1/10, constant, more dull. Tried OTC aleve. No night pain. Worse lying on right side. Motion can be limited at times. No skin changes, numbness.  Past Medical History  Diagnosis Date  . Osteopenia   . COPD (chronic obstructive pulmonary disease) ([place])   . Hyperlipidemia   . Thyroid nodule   . Abnormal LFTs   . Anxiety and depression     son commited suicide 2010  . HTN (hypertension)     intolerant to ACEi (cough)  . Allergic rhinitis   . Panic attacks   . PAD (peripheral artery disease) ([place])     Carotid dz as  below, and at [facility] 11/12:  critical stenosis noted just below the sheath site leading into a totally occluded SFA and a patent deep femoral artery  . Carotid artery disease ([place])     Moderate - f/u dopplers needed 11/2012  . CAD (coronary artery disease)     s/p CABG  . 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 Zadie Rhine    Current Outpatient Prescriptions on File Prior to Visit  Medication Sig Dispense Refill  . Aflibercept (EYLEA) 2 MG/0.05ML SOLN Inject 2 mg into the eye every 5 (five) weeks. Dr. Zadie Rhine    . amLODipine (NORVASC) 5 MG tablet Take 1 tablet (5 mg total) by mouth daily. 30 tablet 8  . aspirin 81 MG EC tablet Take 1 tablet (81 mg total) by mouth daily.    Marland Kitchen atorvastatin (LIPITOR) 40 MG tablet Take 1 tablet (40 mg total) by mouth at bedtime. 90 tablet 2  . Cholecalciferol (VITAMIN D3) 1000 UNITS CAPS Take 1,000 Units by mouth daily.    . clonazePAM (KLONOPIN) 0.5 MG tablet Take 1 tablet by mouth in the morning as needed for anxiety and 1 1/2 to 2 tablets by mouth at night as needed for sleep. 60 tablet 3  . FLUoxetine (PROZAC) 20 MG capsule Take 2 capsules  (40 mg total) by mouth daily. 60 capsule 5  . furosemide (LASIX) 40 MG tablet Take 40 mg by mouth daily as needed for edema.    Marland Kitchen KLOR-CON M20 20 MEQ tablet Take 1 tablet by mouth daily as needed. Take it along with lasix    . losartan (COZAAR) 100 MG tablet TAKE ONE TABLET BY MOUTH ONCE DAILY 90 tablet 0   No current facility-administered medications on file prior to visit.    Past Surgical History  Procedure Laterality Date  . Coronary angioplasty with stent placement      "1; makes total of 3" (07/05/2012)  . Vaginal hysterectomy  ~ 1976    no oophorectomy  . 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 Coronary artery bypass graft N/A 12/15/2013    Procedure: CORONARY ARTERY BYPASS GRAFTING (CABG);  Surgeon: Gaye Pollack, MD;  Location: [place];  Service: Open Heart Surgery;  Laterality: N/A;  Times 3 using left internal mammary artery and endoscopically harvested right saphenous vein   . Intraoperative transesophageal echocardiogram N/A 12/15/2013  Procedure: INTRAOPERATIVE TRANSESOPHAGEAL ECHOCARDIOGRAM;  Surgeon: Gaye Pollack, MD;  Location: [facility] OR;  Service: Open Heart Surgery;  Laterality: N/A;  . Left heart catheterization with coronary angiogram N/A 07/02/2011    Procedure: LEFT HEART CATHETERIZATION WITH CORONARY ANGIOGRAM;  Surgeon: Sherren Mocha, MD;  Location: [facility] CATH LAB;  Service: Cardiovascular;  Laterality: N/A;  . Percutaneous coronary intervention-balloon only  07/02/2011    Procedure: PERCUTANEOUS CORONARY INTERVENTION-BALLOON ONLY;  Surgeon: Sherren Mocha, MD;  Location: [facility] CATH LAB;  Service: Cardiovascular;;  . Left heart catheterization with coronary angiogram N/A 07/05/2012    Procedure: LEFT HEART CATHETERIZATION WITH CORONARY ANGIOGRAM;  Surgeon: Sherren Mocha, MD;  Location: [facility] CATH LAB;  Service: Cardiovascular;  Laterality: N/A;  . Percutaneous coronary  intervention-balloon only  07/05/2012    Procedure: PERCUTANEOUS CORONARY INTERVENTION-BALLOON ONLY;  Surgeon: Sherren Mocha, MD;  Location: [facility] CATH LAB;  Service: Cardiovascular;;  . Left heart catheterization with coronary angiogram N/A 11/28/2013    Procedure: LEFT HEART CATHETERIZATION WITH CORONARY ANGIOGRAM;  Surgeon: Blane Ohara, MD;  Location: [facility] CATH LAB;  Service: Cardiovascular;  Laterality: N/A;    Allergies  Allergen Reactions  . Ace Inhibitors     cough    Social History   Social History  . Marital Status: Widowed    Spouse Name: N/A  . Number of Children: 1  . Years of Education: N/A   Occupational History  . Retired-2003    Social History Main Topics  . Smoking status: Former Smoker -- 0.50 packs/day for 54 years    Types: Cigarettes    Quit date: 12/15/2013  . Smokeless tobacco: Never Used     Comment: quit tobacco 12-2013 after CABG  . Alcohol Use: No  . Drug Use: No  . Sexual Activity: No   Other Topics Concern  . Not on file   Social History Narrative   G son lives w/ her    HC-POA -- sister's daughter     Family History  Problem Relation Age of Onset  . Coronary artery disease Father     CAD in female relative <50  . Heart attack Father   . Hypertension Mother   . Stroke Mother   . Colon cancer Neg Hx   . Breast cancer Neg Hx   . Heart attack Brother 56  . Heart Problems Sister 62    perminant pacemaker  . Suicidality Son 17    2010  . Healthy Grandchild     BP 117/73 mmHg  Ht 5\' 2"  (1.575 m)  Wt 132 lb (59.875 kg)  BMI 24.14 kg/m2  Review of Systems: See HPI above.    Objective:  Physical Exam:  Gen: NAD, comfortable in exam room  Right shoulder: No swelling, ecchymoses.  No gross deformity. No TTP. FROM with mild painful arc. Positive Hawkins, negative Neers. Negative Speeds, Yergasons. Strength 5/5 with empty can and resisted internal/external rotation. Negative apprehension. NV intact distally.  Left  shoulder: FROM without pain.    Assessment & Plan:  1. Right shoulder pain - 2/2 rotator cuff impingement.  Shown home exercises to do daily.  NSAIDs as needed.  Consider injection, physical therapy, nitro patches if not improving.  F/u in 6 weeks.

## 2016-01-01 DIAGNOSIS — H40013 Open angle with borderline findings, low risk, bilateral: Secondary | ICD-10-CM | POA: Diagnosis not present

## 2016-01-01 DIAGNOSIS — H348322 Tributary (branch) retinal vein occlusion, left eye, stable: Secondary | ICD-10-CM | POA: Diagnosis not present

## 2016-01-01 DIAGNOSIS — H25813 Combined forms of age-related cataract, bilateral: Secondary | ICD-10-CM | POA: Diagnosis not present

## 2016-01-17 DIAGNOSIS — H2512 Age-related nuclear cataract, left eye: Secondary | ICD-10-CM | POA: Diagnosis not present

## 2016-01-20 ENCOUNTER — Other Ambulatory Visit: Payer: Self-pay | Admitting: Cardiovascular Disease

## 2016-01-20 ENCOUNTER — Other Ambulatory Visit: Payer: Self-pay | Admitting: Internal Medicine

## 2016-01-21 NOTE — Telephone Encounter (Signed)
Rx printed, awaiting MD signature.  

## 2016-01-21 NOTE — Telephone Encounter (Signed)
Pt is requesting refill on Clonazepam.  Last OV: 12/12/2015 Last Fill: 06/29/2015 #60 and 3RF UDS: 08/29/2014 Low risk   Please advise.

## 2016-01-21 NOTE — Telephone Encounter (Signed)
Ok 60 and 3 RF

## 2016-01-21 NOTE — Telephone Encounter (Signed)
Rx faxed to [facility] pharmacy  

## 2016-02-05 ENCOUNTER — Ambulatory Visit: Payer: Self-pay | Admitting: Family Medicine

## 2016-04-10 ENCOUNTER — Other Ambulatory Visit: Payer: Self-pay

## 2016-04-28 DIAGNOSIS — H34812 Central retinal vein occlusion, left eye, with macular edema: Secondary | ICD-10-CM | POA: Diagnosis not present

## 2016-04-28 DIAGNOSIS — H2511 Age-related nuclear cataract, right eye: Secondary | ICD-10-CM | POA: Diagnosis not present

## 2016-04-28 DIAGNOSIS — H35352 Cystoid macular degeneration, left eye: Secondary | ICD-10-CM | POA: Diagnosis not present

## 2016-04-28 DIAGNOSIS — H35042 Retinal micro-aneurysms, unspecified, left eye: Secondary | ICD-10-CM | POA: Diagnosis not present

## 2016-04-30 ENCOUNTER — Encounter: Payer: Self-pay | Admitting: Nurse Practitioner

## 2016-04-30 ENCOUNTER — Ambulatory Visit (INDEPENDENT_AMBULATORY_CARE_PROVIDER_SITE_OTHER): Payer: Medicare Other | Admitting: Nurse Practitioner

## 2016-04-30 ENCOUNTER — Ambulatory Visit
Admission: RE | Admit: 2016-04-30 | Discharge: 2016-04-30 | Disposition: A | Discharge status: Home / Self Care | Payer: Medicare Other | Source: Ambulatory Visit | Attending: Nurse Practitioner | Admitting: Nurse Practitioner

## 2016-04-30 VITALS — BP 128/70 | HR 76 | Ht 62.0 in | Wt 130.8 lb

## 2016-04-30 DIAGNOSIS — I6523 Occlusion and stenosis of bilateral carotid arteries: Secondary | ICD-10-CM

## 2016-04-30 DIAGNOSIS — I2510 Atherosclerotic heart disease of native coronary artery without angina pectoris: Secondary | ICD-10-CM | POA: Diagnosis not present

## 2016-04-30 DIAGNOSIS — R0602 Shortness of breath: Secondary | ICD-10-CM | POA: Diagnosis not present

## 2016-04-30 DIAGNOSIS — E785 Hyperlipidemia, unspecified: Secondary | ICD-10-CM

## 2016-04-30 DIAGNOSIS — R0600 Dyspnea, unspecified: Secondary | ICD-10-CM

## 2016-04-30 NOTE — Progress Notes (Signed)
CARDIOLOGY OFFICE NOTE  Date:  04/30/2016    Patricia [name] Date of Birth: Sep 11, 1940 Medical Record [id number]  PCP:  Kathlene November, MD  Cardiologist:  Jerel Shepherd  Chief Complaint  Patient presents with  . Coronary Artery Disease    6 month check - seen for Dr. Burt Knack    History of Present Illness: Patricia [name] is a 75 y.o. female who presents today for a 6 month check. Seen for Dr. Burt Knack.   She has a long history of CAD and has undergone multiple PCI procedures. 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 She ultimately underwent multivessel CABG in 2015 and has had complete resolution of her angina since undergoing CABG.   Last seen back in March - cardiac status felt to be ok but was bradycardic and her beta blocker was weaned and discontinued. I saw her a year ago.   Comes in today. Here with her "future" granddaughter. She notes she is more short of breath. Nasty cough - not productive - no recent CXR. Does not wish to have oxygen but feels like she needs it. Seen by pulmonary in the past Elsworth Soho). Not smoking. Doing very little - because she has no energy and gets short of breath. Gets tired with just making the bed, taking a shower, etc. Oxygen sat drops to 85% with walking here in the office today.     Past Medical History:  Diagnosis Date  . Abnormal LFTs   . Allergic rhinitis   . Anxiety and depression    son commited suicide 2010  . 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 CAD (coronary artery disease)    s/p CABG  . Carotid artery disease ([place])    Moderate - f/u dopplers needed 11/2012  . 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 Zadie Rhine  . COPD (chronic obstructive pulmonary disease) ([place])   . HTN (hypertension)    intolerant to ACEi (cough)  . Hyperlipidemia   . Osteopenia   . PAD  (peripheral artery disease) ([place])    Carotid dz as  below, and at [facility] 11/12:  critical stenosis noted just below the sheath site leading into a totally occluded SFA and a patent deep femoral artery  . Panic attacks   . Thyroid nodule     Past Surgical History:  Procedure Laterality Date  . CORONARY ANGIOPLASTY WITH STENT PLACEMENT     "1; makes total of 3" (07/05/2012)  . CORONARY ARTERY BYPASS GRAFT N/A 12/15/2013   Procedure: CORONARY ARTERY BYPASS GRAFTING (CABG);  Surgeon: Gaye Pollack, MD;  Location: [facility];  Service: Open Heart Surgery;  Laterality: N/A;  Times 3 using left internal mammary artery and endoscopically harvested right saphenous vein   . INTRAOPERATIVE TRANSESOPHAGEAL ECHOCARDIOGRAM N/A 12/15/2013   Procedure: INTRAOPERATIVE TRANSESOPHAGEAL ECHOCARDIOGRAM;  Surgeon: Gaye Pollack, MD;  Location: [facility] OR;  Service: Open Heart Surgery;  Laterality: N/A;  . 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 LEFT HEART CATHETERIZATION WITH CORONARY ANGIOGRAM N/A 07/02/2011   Procedure: LEFT HEART CATHETERIZATION WITH CORONARY ANGIOGRAM;  Surgeon: Sherren Mocha, MD;  Location: [facility] C[facility] LAB;  Service: Cardiovascular;  Laterality: N/A;  . LEFT HEART CATHETERIZATION WITH  CORONARY ANGIOGRAM N/A 07/05/2012   Procedure: LEFT HEART CATHETERIZATION WITH CORONARY ANGIOGRAM;  Surgeon: Sherren Mocha, MD;  Location: [facility] CATH LAB;  Service: Cardiovascular;  Laterality: N/A;  . LEFT HEART CATHETERIZATION WITH CORONARY ANGIOGRAM N/A 11/28/2013   Procedure: LEFT HEART CATHETERIZATION WITH CORONARY ANGIOGRAM;  Surgeon: Blane Ohara, MD;  Location: [facility] CATH LAB;  Service: Cardiovascular;  Laterality: N/A;  . PERCUTANEOUS CORONARY INTERVENTION-BALLOON ONLY  07/02/2011   Procedure: PERCUTANEOUS CORONARY INTERVENTION-BALLOON ONLY;  Surgeon: Sherren Mocha, MD;  Location: [facility] CATH LAB;  Service: Cardiovascular;;  .  PERCUTANEOUS CORONARY INTERVENTION-BALLOON ONLY  07/05/2012   Procedure: PERCUTANEOUS CORONARY INTERVENTION-BALLOON ONLY;  Surgeon: Sherren Mocha, MD;  Location: [facility] CATH LAB;  Service: Cardiovascular;;  . VAGINAL HYSTERECTOMY  ~ 1976   no oophorectomy     Medications: Current Outpatient Prescriptions  Medication Sig Dispense Refill  . Aflibercept (EYLEA) 2 MG/0.05ML SOLN Inject 2 mg into the eye every 5 (five) weeks. Dr. Zadie Rhine    . amLODipine (NORVASC) 5 MG tablet Take 1 tablet (5 mg total) by mouth daily. 30 tablet 5  . aspirin 81 MG EC tablet Take 1 tablet (81 mg total) by mouth daily.    Marland Kitchen atorvastatin (LIPITOR) 40 MG tablet Take 1 tablet (40 mg total) by mouth at bedtime. 90 tablet 2  . Cholecalciferol (VITAMIN D3) 1000 UNITS CAPS Take 1,000 Units by mouth daily.    . clonazePAM (KLONOPIN) 0.5 MG tablet Take 1 tablet by mouth in the morning PRN for anxiety and 1 1/2 to 2 tablets qhs prn for sleep 60 tablet 3  . FLUoxetine (PROZAC) 20 MG capsule Take 2 capsules (40 mg total) by mouth daily. 60 capsule 5  . furosemide (LASIX) 40 MG tablet Take 40 mg by mouth daily as needed for edema.    Marland Kitchen KLOR-CON M20 20 MEQ tablet Take 1 tablet by mouth daily as needed. Take it along with lasix    . losartan (COZAAR) 100 MG tablet TAKE ONE TABLET BY MOUTH ONCE DAILY 90 tablet 2   No current facility-administered medications for this visit.     Allergies: Allergies  Allergen Reactions  . Ace Inhibitors     cough    Social History: The patient  reports that she quit smoking about 2 years ago. Her smoking use included Cigarettes. She has a 27.00 pack-year smoking history. She has never used smokeless tobacco. She reports that she does not drink alcohol or use drugs.   Family History: The patient's family history includes Coronary artery disease in her father; Healthy in her grandchild; Heart Problems (age of onset: 52) in her sister; Heart attack in her father; Heart attack (age of onset: 65) in  her brother; Hypertension in her mother; Stroke in her mother; Suicidality (age of onset: 22) in her son.   Review of Systems: Please see the history of present illness.   Otherwise, the review of systems is positive for none.   All other systems are reviewed and negative.   Physical Exam: VS:  BP 128/70   Pulse 76   Ht 5\' 2"  (1.575 m)   Wt 130 lb 12.8 oz (59.3 kg)   SpO2 91% Comment: at rest/walking 85  BMI 23.92 kg/m  .  BMI Body mass index is 23.92 kg/m.   Oxygen level dropped to 85% with walking here in the office.   Wt Readings from Last 3 Encounters:  04/30/16 130 lb 12.8 oz (59.3 kg)  12/25/15 132 lb (59.9 kg)  12/12/15 134  lb (60.8 kg)    General: Pleasant. Elderly female who is alert and in no acute distress.   HEENT: Normal.  Neck: Supple, no JVD, carotid bruits, or masses noted.  Cardiac: Regular rate and rhythm. No murmurs, rubs, or gallops. No edema.  Respiratory:  Decreased breath sounds and with some increased work of breathing.  GI: Soft and nontender.  MS: No deformity or atrophy. Gait and ROM intact.  Skin: Warm and dry. Color is normal.  Neuro:  Strength and sensation are intact and no gross focal deficits noted.  Psych: Alert, appropriate and with normal affect.   LABORATORY DATA:  EKG:  EKG is not ordered today.  Lab Results  Component Value Date   WBC 10.1 06/29/2015   HGB 13.9 06/29/2015   HCT 43.0 06/29/2015   PLT 324 06/29/2015   GLUCOSE 75 12/12/2015   CHOL 150 04/18/2015   TRIG 76.0 04/18/2015   HDL 60.80 04/18/2015   LDLDIRECT 156.0 04/26/2007   LDLCALC 74 04/18/2015   ALT 21 04/18/2015   AST 18 04/18/2015   NA 140 12/12/2015   K 3.8 12/12/2015   CL 101 12/12/2015   CREATININE 0.61 12/12/2015   BUN 15 12/12/2015   CO2 31 12/12/2015   TSH 1.36 04/06/2014   INR 1.42 12/15/2013   HGBA1C 5.6 06/29/2015    BNP (last 3 results) No results for input(s): BNP in the last 8760 hours.  ProBNP (last 3 results) No results for  input(s): PROBNP in the last 8760 hours.   Other Studies Reviewed Today:   Assessment/Plan: 1.  CAD, native vessel, without symptoms of angina: She is felt to be doing well - would continue with current regimen.   2. Symptomatic bradycardia: Not currently an issue  3. Essential hypertension: BP looks ok on her current regimen - no changes made today  4. Hyperlipidemia: on statin therapy - having labs with her physical in the next few weeks.   5. Carotid disease - last study with moderate disease noted in 04/2015 - needs updating. Will schedule.   6. Dyspnea/COPD - she does desat with walking. She is agreeable to seeing Dr. Elsworth Soho - she will consider oxygen. I think this could help her be more active. She is already doing less due to symptoms. Referral placed. Send for CXR.   Current medicines are reviewed with the patient today.  The patient does not have concerns regarding medicines other than what has been noted above.  The following changes have been made:  See above.  Labs/ tests ordered today include:    Orders Placed This Encounter  Procedures  . DG Chest 2 View  . Ambulatory referral to Pulmonology     Disposition:   FU with Dr. Burt Knack in about 4 months. I am happy to see back as needed.   Patient is agreeable to this plan and will call if any problems develop in the interim.   Signed: Burtis Junes, RN, ANP-C 04/30/2016 4:01 PM  [facility] [address] [place], [place]  [address] Phone: [phone] Fax: [phone]

## 2016-04-30 NOTE — Patient Instructions (Addendum)
We will be checking the following labs today - NONE  Please go to [facility] to [facility] on the first floor for a chest Xray - you may walk in.     Medication Instructions:    Continue with your current medicines.     Testing/Procedures To Be Arranged:  Carotid doppler   Follow-Up:   See Dr. Burt Knack in 3 to 4 months  I am referring you back to pulmonary    Other Special Instructions:   N/A    If you need a refill on your cardiac medications before your next appointment, please call your pharmacy.   Call the [facility] office at [phone] if you have any questions, problems or concerns.

## 2016-05-01 ENCOUNTER — Other Ambulatory Visit: Payer: Self-pay | Admitting: *Deleted

## 2016-05-01 MED ORDER — DOXYCYCLINE HYCLATE 100 MG PO TABS: 100.0000 mg | ORAL_TABLET | Freq: Every day | ORAL | 0 refills | Status: DC

## 2016-05-05 ENCOUNTER — Ambulatory Visit (HOSPITAL_COMMUNITY)
Admission: RE | Admit: 2016-05-05 | Discharge: 2016-05-05 | Disposition: A | Discharge status: Home / Self Care | Payer: Medicare Other | Source: Ambulatory Visit | Attending: Cardiology | Admitting: Cardiology

## 2016-05-05 DIAGNOSIS — I6523 Occlusion and stenosis of bilateral carotid arteries: Secondary | ICD-10-CM | POA: Diagnosis not present

## 2016-05-05 DIAGNOSIS — J449 Chronic obstructive pulmonary disease, unspecified: Secondary | ICD-10-CM | POA: Diagnosis not present

## 2016-05-05 DIAGNOSIS — I10 Essential (primary) hypertension: Secondary | ICD-10-CM | POA: Insufficient documentation

## 2016-05-05 DIAGNOSIS — I2510 Atherosclerotic heart disease of native coronary artery without angina pectoris: Secondary | ICD-10-CM | POA: Insufficient documentation

## 2016-05-05 DIAGNOSIS — Z87891 Personal history of nicotine dependence: Secondary | ICD-10-CM | POA: Insufficient documentation

## 2016-05-05 DIAGNOSIS — E785 Hyperlipidemia, unspecified: Secondary | ICD-10-CM | POA: Diagnosis not present

## 2016-05-13 ENCOUNTER — Encounter: Payer: Self-pay | Admitting: Internal Medicine

## 2016-05-13 ENCOUNTER — Ambulatory Visit (HOSPITAL_BASED_OUTPATIENT_CLINIC_OR_DEPARTMENT_OTHER)
Admission: RE | Admit: 2016-05-13 | Discharge: 2016-05-13 | Disposition: A | Discharge status: Home / Self Care | Payer: Medicare Other | Source: Ambulatory Visit | Attending: Internal Medicine | Admitting: Internal Medicine

## 2016-05-13 ENCOUNTER — Ambulatory Visit (INDEPENDENT_AMBULATORY_CARE_PROVIDER_SITE_OTHER): Payer: Medicare Other | Admitting: Internal Medicine

## 2016-05-13 VITALS — BP 122/70 | HR 83 | Temp 97.7°F | Resp 14 | Ht 62.0 in | Wt 129.1 lb

## 2016-05-13 DIAGNOSIS — I10 Essential (primary) hypertension: Secondary | ICD-10-CM | POA: Diagnosis not present

## 2016-05-13 DIAGNOSIS — M5136 Other intervertebral disc degeneration, lumbar region: Secondary | ICD-10-CM | POA: Insufficient documentation

## 2016-05-13 DIAGNOSIS — M545 Low back pain, unspecified: Secondary | ICD-10-CM

## 2016-05-13 DIAGNOSIS — J449 Chronic obstructive pulmonary disease, unspecified: Secondary | ICD-10-CM | POA: Diagnosis not present

## 2016-05-13 DIAGNOSIS — E785 Hyperlipidemia, unspecified: Secondary | ICD-10-CM

## 2016-05-13 DIAGNOSIS — Z23 Encounter for immunization: Secondary | ICD-10-CM

## 2016-05-13 DIAGNOSIS — I6523 Occlusion and stenosis of bilateral carotid arteries: Secondary | ICD-10-CM

## 2016-05-13 DIAGNOSIS — I700 Atherosclerosis of aorta: Secondary | ICD-10-CM | POA: Insufficient documentation

## 2016-05-13 DIAGNOSIS — R194 Change in bowel habit: Secondary | ICD-10-CM

## 2016-05-13 LAB — BASIC METABOLIC PANEL
BUN: 14 mg/dL (ref 6–23)
CALCIUM: 8.8 mg/dL (ref 8.4–10.5)
CHLORIDE: 104 meq/L (ref 96–112)
CO2: 33 mEq/L — ABNORMAL HIGH (ref 19–32)
CREATININE: 0.59 mg/dL (ref 0.40–1.20)
GFR: 105.27 mL/min (ref >60.00)
Glucose, Bld: 88 mg/dL (ref 70–99)
Potassium: 3.8 mEq/L (ref 3.5–5.1)
Sodium: 142 mEq/L (ref 135–145)

## 2016-05-13 LAB — LIPID PANEL
Cholesterol: 143 mg/dL (ref 0–200)
HDL: 57.9 mg/dL
LDL Cholesterol: 69 mg/dL (ref 0–99)
NonHDL: 85.31
Total CHOL/HDL Ratio: 2
Triglycerides: 81 mg/dL (ref 0.0–149.0)
VLDL: 16.2 mg/dL (ref 0.0–40.0)

## 2016-05-13 LAB — CBC WITH DIFFERENTIAL/PLATELET
Basophils Absolute: 0 K/uL (ref 0.0–0.1)
Basophils Relative: 0.5 % (ref 0.0–3.0)
Eosinophils Absolute: 0.1 K/uL (ref 0.0–0.7)
Eosinophils Relative: 2.3 % (ref 0.0–5.0)
HCT: 43 % (ref 36.0–46.0)
Hemoglobin: 14.2 g/dL (ref 12.0–15.0)
Lymphocytes Relative: 28.6 % (ref 12.0–46.0)
Lymphs Abs: 1.7 K/uL (ref 0.7–4.0)
MCHC: 33 g/dL (ref 30.0–36.0)
MCV: 85.2 fl (ref 78.0–100.0)
Monocytes Absolute: 0.5 K/uL (ref 0.1–1.0)
Monocytes Relative: 7.6 % (ref 3.0–12.0)
Neutro Abs: 3.7 K/uL (ref 1.4–7.7)
Neutrophils Relative %: 61 % (ref 43.0–77.0)
Platelets: 266 K/uL (ref 150.0–400.0)
RBC: 5.05 Mil/uL (ref 3.87–5.11)
RDW: 14 % (ref 11.5–15.5)
WBC: 6 K/uL (ref 4.0–10.5)

## 2016-05-13 LAB — ALT: ALT: 14 U/L (ref 0–35)

## 2016-05-13 LAB — AST: AST: 16 U/L (ref 0–37)

## 2016-05-13 MED ORDER — TIOTROPIUM BROMIDE MONOHYDRATE 18 MCG IN CAPS: 18.0000 ug | ORAL_CAPSULE | Freq: Every day | RESPIRATORY_TRACT | 6 refills | Status: DC

## 2016-05-13 NOTE — Assessment & Plan Note (Signed)
HTN: Continue amlodipine, Lasix, potassium and losartan. Check a BMP Hyperlipidemia: Continue Lipitor, check FLP, AST, ALT Anxiety and depression: Continue clonazepam and Prozac. UDS and contract today COPD: Recently saw cardiology, noted desaturation with exertion, chest x-ray with question of infiltrate, S/P antibiotics, has an appointment to see Dr. Elsworth Soho soon. Recommend to start Spiriva as she is having DOE with ADLs. Check a CBC. Back pain: Right-sided back pain with no radiation, likely a MSK issue. X-rays. Recommend Tylenol as needed. Change in bowel habits: Refer to GI, she is aware she may not be a candidate for colonoscopy due to comorbidities but likes to get GI opinion Flu shot today RTC 3 months, CPX

## 2016-05-13 NOTE — Progress Notes (Signed)
Subjective:    Patient ID: Patricia [name], female    DOB: [birth year], 75 y.o.   MRN: [id number]  DOS:  05/13/2016 Type of visit - description : Routine checkup, to discuss multiple issues Interval history: COPD: On no medications, getting gradually more DOE, sometimes with ADLs like taking a shower. Had a chest x-ray 05/01/2013, question of infiltrate, at the time did not have any fever or chills. She did notice increased cough and minimal sputum production without wheezing. Took antibiotics, symptoms have decreased to some extent.  CAD, carotid disease: Saw cardiology, felt to be stable  For the last few months, her bowel movement pattern has changed, now has 4 to 5 BMs daily, with some urgency. Denies any diarrhea, blood in the stools but she has seen some mucus. Not taking any new medications.  Complain of pain at the right hip, right low back. Sometimes area is TTP, worse with certain movements, no radiation, no lower extremity paresthesias.  HTN: Good medication compliance   Review of Systems See above  Past Medical History:  Diagnosis Date  . Abnormal LFTs   . Allergic rhinitis   . Anxiety and depression    son commited suicide 2010  . 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 CAD (coronary artery disease)    s/p CABG  . Carotid artery disease ([place])    Moderate - f/u dopplers needed 11/2012  . 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 Zadie Rhine  . COPD (chronic obstructive pulmonary disease) ([place])   . HTN (hypertension)    intolerant to ACEi (cough)  . Hyperlipidemia   . Osteopenia   . PAD (peripheral artery disease) ([place])    Carotid dz as  below, and at [facility] 11/12:  critical stenosis noted just below the sheath site leading into a totally occluded SFA and a patent deep femoral artery  . Panic attacks   . Thyroid nodule     Past Surgical History:  Procedure Laterality Date  . CORONARY ANGIOPLASTY WITH STENT PLACEMENT       "1; makes total of 3" (07/05/2012)  . CORONARY ARTERY BYPASS GRAFT N/A 12/15/2013   Procedure: CORONARY ARTERY BYPASS GRAFTING (CABG);  Surgeon: Gaye Pollack, MD;  Location: [facility];  Service: Open Heart Surgery;  Laterality: N/A;  Times 3 using left internal mammary artery and endoscopically harvested right saphenous vein   . INTRAOPERATIVE TRANSESOPHAGEAL ECHOCARDIOGRAM N/A 12/15/2013   Procedure: INTRAOPERATIVE TRANSESOPHAGEAL ECHOCARDIOGRAM;  Surgeon: Gaye Pollack, MD;  Location: [facility] OR;  Service: Open Heart Surgery;  Laterality: N/A;  . 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 LEFT HEART CATHETERIZATION WITH CORONARY ANGIOGRAM N/A 07/02/2011   Procedure: LEFT HEART CATHETERIZATION WITH CORONARY ANGIOGRAM;  Surgeon: Sherren Mocha, MD;  Location: [facility] CATH LAB;  Service: Cardiovascular;  Laterality: N/A;  . LEFT HEART CATHETERIZATION WITH CORONARY ANGIOGRAM N/A 07/05/2012   Procedure: LEFT HEART CATHETERIZATION WITH CORONARY ANGIOGRAM;  Surgeon: Sherren Mocha, MD;  Location: [facility] CATH LAB;  Service: Cardiovascular;  Laterality: N/A;  . LEFT HEART CATHETERIZATION WITH CORONARY ANGIOGRAM N/A 11/28/2013   Procedure: LEFT HEART CATHETERIZATION WITH CORONARY ANGIOGRAM;  Surgeon: Blane Ohara, MD;  Location: [facility] CATH LAB;  Service: Cardiovascular;  Laterality: N/A;  . PERCUTANEOUS CORONARY INTERVENTION-BALLOON ONLY  07/02/2011   Procedure: PERCUTANEOUS CORONARY INTERVENTION-BALLOON ONLY;  Surgeon: Sherren Mocha, MD;  Location: [facility] CATH LAB;  Service: Cardiovascular;;  . PERCUTANEOUS CORONARY INTERVENTION-BALLOON ONLY  07/05/2012   Procedure: PERCUTANEOUS CORONARY INTERVENTION-BALLOON ONLY;  Surgeon: Sherren Mocha, MD;  Location: [facility] CATH LAB;  Service: Cardiovascular;;  . VAGINAL HYSTERECTOMY  ~ 1976   no oophorectomy    Social History   Social History  . Marital status: Widowed    Spouse name: N/A   . Number of children: 1  . Years of education: N/A   Occupational History  . Retired-2003    Social History Main Topics  . Smoking status: Former Smoker    Packs/day: 0.50    Years: 54.00    Types: Cigarettes    Quit date: 12/15/2013  . Smokeless tobacco: Never Used     Comment: quit tobacco 12-2013 after CABG  . Alcohol use No  . Drug use: No  . Sexual activity: No   Other Topics Concern  . Not on file   Social History Narrative   G son lives w/ her    HC-POA -- sister's daughter         Medication List       Accurate as of 05/13/16  5:17 PM. Always use your most recent med list.          amLODipine 5 MG tablet Commonly known as:  NORVASC Take 1 tablet (5 mg total) by mouth daily.   aspirin 81 MG EC tablet Take 1 tablet (81 mg total) by mouth daily.   atorvastatin 40 MG tablet Commonly known as:  LIPITOR Take 1 tablet (40 mg total) by mouth at bedtime.   clonazePAM 0.5 MG tablet Commonly known as:  KLONOPIN Take 1 tablet by mouth in the morning PRN for anxiety and 1 1/2 to 2 tablets qhs prn for sleep   EYLEA 2 MG/0.05ML Soln Generic drug:  Aflibercept Inject 2 mg into the eye every 5 (five) weeks. Dr. Zadie Rhine   FLUoxetine 20 MG capsule Commonly known as:  PROZAC Take 2 capsules (40 mg total) by mouth daily.   furosemide 40 MG tablet Commonly known as:  LASIX Take 40 mg by mouth daily as needed for edema.   KLOR-CON M20 20 MEQ tablet Generic drug:  potassium chloride SA Take 1 tablet by mouth daily as needed. Take it along with lasix   losartan 100 MG tablet Commonly known as:  COZAAR TAKE ONE TABLET BY MOUTH ONCE DAILY   tiotropium 18 MCG inhalation capsule Commonly known as:  SPIRIVA HANDIHALER Place 1 capsule (18 mcg total) into inhaler and inhale daily.   Vitamin D3 1000 units Caps Take 1,000 Units by mouth daily.          Objective:   Physical Exam BP 122/70 (BP Location: Left Arm, Patient Position: Sitting, Cuff Size: Normal)    Pulse 83   Temp 97.7 F (36.5 C) (Oral)   Resp 14   Ht 5\' 2"  (1.575 m)   Wt 129 lb 2 oz (58.6 kg)   SpO2 93%   BMI 23.62 kg/m  General:   Well developed, well nourished . NAD.  HEENT:  Normocephalic . Face symmetric, atraumatic. Neck: No lymphadenopathy or mass Lungs:  Decreased breath sounds, few rhonchi with cough. Normal respiratory effort, no intercostal retractions, no accessory muscle use. Heart: RRR,  no murmur.  no pretibial edema bilaterally  Abdomen:  Not distended, soft, non-tender. No rebound or rigidity.  MSK: Slightly TTP at the right lower back, walks without much difficulty. Skin:  Not pale. Not jaundice Neurologic:  alert & oriented X3.  Speech normal, gait appropriate for age and unassisted Psych--  Cognition and judgment appear intact.  Cooperative with normal attention span and concentration.  Behavior appropriate. No anxious or depressed appearing.    Assessment & Plan:   Assessment>  Hyperglycemia, A1c 5.7 HTN Hyperlipidemia Anxiety depression COPD , quit tobacco 2015 DJD CV: --CAD,cath 2012, 2013, CABG 2015 --PAD -   ABIs 2012, 07-2015 : Stable moderate disease left, normal right  --Carotid disease - [place] 04-2015, mod dz, fu 1 year --Central Retinal vein occlusion 02-2015 --Palpable aorta US abdomen 06-2015 no AA   Increase LFTs, elevated alkaline phosphatase , [place] abd neg 06-2015  Thyroid nodule --[place] 2012 stable (since 2009)   PLAN HTN: Continue amlodipine, Lasix, potassium and losartan. Check a BMP Hyperlipidemia: Continue Lipitor, check FLP, AST, ALT Anxiety and depression: Continue clonazepam and Prozac. UDS and contract today COPD: Recently saw cardiology, noted desaturation with exertion, chest x-ray with question of infiltrate, S/P antibiotics, has an appointment to see Dr. Elsworth Soho soon. Recommend to start Spiriva as she is having DOE with ADLs. Check a CBC. Back pain: Right-sided back pain with no radiation, likely a MSK issue. X-rays.  Recommend Tylenol as needed. Change in bowel habits: Refer to GI, she is aware she may not be a candidate for colonoscopy due to comorbidities but likes to get GI opinion Flu shot today RTC 3 months, CPX  F2F time >>  30 minutes due to multiple issues

## 2016-05-13 NOTE — Patient Instructions (Addendum)
GO TO THE LAB : Get the blood work     GO TO THE FRONT DESK Schedule your next appointment for a  physical exam in 3 months    STOP BY THE FIRST FLOOR:  get the XR   For emphysema: Start Spiriva  Mucinex DM twice a day for one week then as needed  For back pain: Tylenol 500 mg 2 tablets 3 times a day as needed.

## 2016-05-13 NOTE — Progress Notes (Signed)
Pre visit review using our clinic review tool, if applicable. No additional management support is needed unless otherwise documented below in the visit note. 

## 2016-05-14 ENCOUNTER — Encounter: Payer: Self-pay | Admitting: Internal Medicine

## 2016-05-15 ENCOUNTER — Encounter: Payer: Self-pay | Admitting: Pulmonary Disease

## 2016-05-15 ENCOUNTER — Ambulatory Visit (INDEPENDENT_AMBULATORY_CARE_PROVIDER_SITE_OTHER): Payer: Medicare Other | Admitting: Pulmonary Disease

## 2016-05-15 ENCOUNTER — Ambulatory Visit (HOSPITAL_BASED_OUTPATIENT_CLINIC_OR_DEPARTMENT_OTHER)
Admission: RE | Admit: 2016-05-15 | Discharge: 2016-05-15 | Disposition: A | Discharge status: Home / Self Care | Payer: Medicare Other | Source: Ambulatory Visit | Attending: Pulmonary Disease | Admitting: Pulmonary Disease

## 2016-05-15 DIAGNOSIS — J439 Emphysema, unspecified: Secondary | ICD-10-CM | POA: Diagnosis not present

## 2016-05-15 DIAGNOSIS — J189 Pneumonia, unspecified organism: Secondary | ICD-10-CM | POA: Diagnosis not present

## 2016-05-15 DIAGNOSIS — J181 Lobar pneumonia, unspecified organism: Secondary | ICD-10-CM | POA: Diagnosis not present

## 2016-05-15 DIAGNOSIS — I6523 Occlusion and stenosis of bilateral carotid arteries: Secondary | ICD-10-CM

## 2016-05-15 NOTE — Assessment & Plan Note (Signed)
Chest xray today.

## 2016-05-15 NOTE — Assessment & Plan Note (Signed)
Stay on Spiriva Does not need oxygen at this time-desaturation was related to pneumonia We'll repeat spirometry in the future

## 2016-05-15 NOTE — Patient Instructions (Addendum)
Chest x-ray today Stay on Spiriva

## 2016-05-15 NOTE — Progress Notes (Signed)
   Subjective:    Patient ID: Patricia [name], female    DOB: [birth year], 75 y.o.   MRN: [id number]  HPI  3 yowf for FU of COPD  She quit smoking in 2015 after CABG -#30-pack-years  05/15/2016  Chief Complaint  Patient presents with  . Pulmonary Consult    Kathrynn Humble, PA referred patient for follow up for COPD, oxygen level low.  Xray showed Pneumonia.  CAT: 13   She was lost to follow-up for 2 years She was seen by cardiology 2 weeks ago, was having a cough, oxygen saturation noted to drop to 85% on walking, chest x-ray showed right upper lobe infiltrate-treated with Z-Pak She states cough is improved, dyspnea is more towards baseline She was started on Spiriva by her PCP last week and this seems to help  Her weight is at baseline on 1:30 pounds No ER visits or hospitalizations since last visit  Oxygen saturation dropped to 92% while walking and recovered with rest   Significant tests/ events   CT chest 6/09 performed for apical scarring - no nodules  CT chest 11/2012 -emphysema    08/29/2011 PFTs >> ratio 56, FEV1 92%, small airways 26%, no BD response, some air trapping, DLCO 66%  PFTs 12/2013 -FEV1 78%, low ratio, DLCO 44%   Past Medical History:  Diagnosis Date  . Abnormal LFTs   . Allergic rhinitis   . Anxiety and depression    son commited suicide 2010  . 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 CAD (coronary artery disease)    s/p CABG  . Carotid artery disease ([place])    Moderate - f/u dopplers needed 11/2012  . 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 Zadie Rhine  . COPD (chronic obstructive pulmonary disease) ([place])   . HTN (hypertension)    intolerant to ACEi (cough)  . Hyperlipidemia   . Osteopenia   . PAD (peripheral artery disease) ([place])    Carotid dz as  below, and at [facility] 11/12:  critical stenosis noted just below the sheath site leading into a totally occluded SFA and a patent deep femoral artery  .  Panic attacks   . Thyroid nodule        Review of Systems  Constitutional: Negative for chills, fever and unexpected weight change.  HENT: Negative for congestion, dental problem, ear pain, nosebleeds, postnasal drip, rhinorrhea, sinus pressure, sneezing, sore throat, trouble swallowing and voice change.   Eyes: Negative for visual disturbance.  Respiratory: Positive for cough and shortness of breath. Negative for choking.   Cardiovascular: Negative for chest pain and leg swelling.  Gastrointestinal: Negative for abdominal pain, diarrhea and vomiting.  Genitourinary: Negative for difficulty urinating.  Musculoskeletal: Negative for arthralgias.  Skin: Negative for rash.  Neurological: Negative for tremors, syncope and headaches.  Hematological: Does not bruise/bleed easily.       Objective:   Physical Exam  Gen. Pleasant, thin, in no distress ENT - no lesions, no post nasal drip Neck: No JVD, no thyromegaly, no carotid bruits Lungs: no use of accessory muscles, no dullness to percussion, decreased without rales or rhonchi  Cardiovascular: Rhythm regular, heart sounds  normal, no murmurs or gallops, no peripheral edema Musculoskeletal: No deformities, no cyanosis or clubbing         Assessment & Plan:

## 2016-06-04 ENCOUNTER — Other Ambulatory Visit: Payer: Self-pay | Admitting: Internal Medicine

## 2016-06-09 DIAGNOSIS — H34812 Central retinal vein occlusion, left eye, with macular edema: Secondary | ICD-10-CM | POA: Diagnosis not present

## 2016-06-16 DIAGNOSIS — Z961 Presence of intraocular lens: Secondary | ICD-10-CM | POA: Diagnosis not present

## 2016-06-16 DIAGNOSIS — H25811 Combined forms of age-related cataract, right eye: Secondary | ICD-10-CM | POA: Diagnosis not present

## 2016-06-16 DIAGNOSIS — H348322 Tributary (branch) retinal vein occlusion, left eye, stable: Secondary | ICD-10-CM | POA: Diagnosis not present

## 2016-06-16 DIAGNOSIS — H40013 Open angle with borderline findings, low risk, bilateral: Secondary | ICD-10-CM | POA: Diagnosis not present

## 2016-06-24 ENCOUNTER — Other Ambulatory Visit (HOSPITAL_COMMUNITY): Payer: Self-pay | Admitting: Pulmonary Disease

## 2016-06-24 DIAGNOSIS — I739 Peripheral vascular disease, unspecified: Secondary | ICD-10-CM

## 2016-07-07 ENCOUNTER — Other Ambulatory Visit: Payer: Self-pay | Admitting: Internal Medicine

## 2016-07-07 DIAGNOSIS — H2511 Age-related nuclear cataract, right eye: Secondary | ICD-10-CM | POA: Diagnosis not present

## 2016-07-07 NOTE — Telephone Encounter (Signed)
Okay #60, 1 refill 

## 2016-07-07 NOTE — Telephone Encounter (Signed)
Rx faxed to [facility] pharmacy  

## 2016-07-07 NOTE — Telephone Encounter (Signed)
Pt is requesting refill on Clonazepam.  Last OV: 05/13/2016 Last Fill: 01/21/2016 #60 and 3RF UDS: 08/29/2014 Low risk  Please advise.

## 2016-07-07 NOTE — Telephone Encounter (Signed)
Rx printed, awaiting MD signature.  

## 2016-07-10 DIAGNOSIS — H2511 Age-related nuclear cataract, right eye: Secondary | ICD-10-CM | POA: Diagnosis not present

## 2016-07-15 ENCOUNTER — Ambulatory Visit (HOSPITAL_COMMUNITY)
Admission: RE | Admit: 2016-07-15 | Discharge: 2016-07-15 | Disposition: A | Discharge status: Home / Self Care | Payer: Medicare Other | Source: Ambulatory Visit | Attending: Cardiology | Admitting: Cardiology

## 2016-07-15 DIAGNOSIS — I739 Peripheral vascular disease, unspecified: Secondary | ICD-10-CM | POA: Insufficient documentation

## 2016-07-15 DIAGNOSIS — Z87891 Personal history of nicotine dependence: Secondary | ICD-10-CM | POA: Diagnosis not present

## 2016-07-15 DIAGNOSIS — I2510 Atherosclerotic heart disease of native coronary artery without angina pectoris: Secondary | ICD-10-CM | POA: Diagnosis not present

## 2016-07-15 DIAGNOSIS — J449 Chronic obstructive pulmonary disease, unspecified: Secondary | ICD-10-CM | POA: Diagnosis not present

## 2016-07-15 DIAGNOSIS — E785 Hyperlipidemia, unspecified: Secondary | ICD-10-CM | POA: Insufficient documentation

## 2016-07-15 DIAGNOSIS — I10 Essential (primary) hypertension: Secondary | ICD-10-CM | POA: Insufficient documentation

## 2016-07-16 ENCOUNTER — Encounter: Payer: Self-pay | Admitting: Internal Medicine

## 2016-07-16 ENCOUNTER — Ambulatory Visit (INDEPENDENT_AMBULATORY_CARE_PROVIDER_SITE_OTHER): Payer: Medicare Other | Admitting: Internal Medicine

## 2016-07-16 VITALS — BP 140/62 | HR 64 | Ht 62.5 in | Wt 130.4 lb

## 2016-07-16 DIAGNOSIS — Z1211 Encounter for screening for malignant neoplasm of colon: Secondary | ICD-10-CM

## 2016-07-16 DIAGNOSIS — R197 Diarrhea, unspecified: Secondary | ICD-10-CM | POA: Diagnosis not present

## 2016-07-16 DIAGNOSIS — I6523 Occlusion and stenosis of bilateral carotid arteries: Secondary | ICD-10-CM

## 2016-07-16 MED ORDER — NA SULFATE-K SULFATE-MG SULF 17.5-3.13-1.6 GM/177ML PO SOLN: 1.0000 | Freq: Once | ORAL | 0 refills | Status: AC

## 2016-07-16 NOTE — Patient Instructions (Signed)

## 2016-07-16 NOTE — Progress Notes (Signed)
HISTORY OF PRESENT ILLNESS:  Patricia [name] is a 75 y.o. female with multiple significant medical problems who is referred by her primary care provider Dr. Belinda Fisher with chief complaint of change in bowel habits. She has history of coronary artery disease with prior CABG, COPD secondary to prior tobacco use, and anxiety. Patient reports being in her usual state of health until about 6 months ago when she describes increased frequency of bowel movements. Previously she reported 2 formed bowel movements per day. This change to 6 or more loose bowel movements per day with intermittent minor incontinence without notice. Rare red blood on the tissue only. No associated abdominal pain or weight loss. Over time things have gradually improved but not return to normal. At this time she describes 4-5 mostly formed bowel movements per day. Still some rectal discharge. No new medications or dietary changes. No prior history of GI evaluations or colonoscopy. No family history of colon cancer. She has not tried any remedies for this problem. She has been wearing protective undergarments. She is widowed and lives with her son. Review of outside laboratories finds unremarkable comprehensive metabolic panel, lipid panel, and CBC with differential October 2017. No significant abnormalities on ultrasound one year ago.  REVIEW OF SYSTEMS:  All non-GI ROS negative except for anxiety, urinary leakage  Past Medical History:  Diagnosis Date  . Abnormal LFTs   . Allergic rhinitis   . Anxiety and depression    son commited suicide 2010  . 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 CAD (coronary artery disease)    s/p CABG  . Carotid artery disease ([place])    Moderate - f/u dopplers needed 11/2012  . 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 Zadie Rhine  . COPD (chronic obstructive pulmonary disease) ([place])   . HTN (hypertension)    intolerant to ACEi (cough)  . Hyperlipidemia   .  Osteopenia   . PAD (peripheral artery disease) ([place])    Carotid dz as  below, and at [facility] 11/12:  critical stenosis noted just below the sheath site leading into a totally occluded SFA and a patent deep femoral artery  . Panic attacks   . Thyroid nodule     Past Surgical History:  Procedure Laterality Date  . CORONARY ANGIOPLASTY WITH STENT PLACEMENT     "1; makes total of 3" (07/05/2012)  . CORONARY ARTERY BYPASS GRAFT N/A 12/15/2013   Procedure: CORONARY ARTERY BYPASS GRAFTING (CABG);  Surgeon: Gaye Pollack, MD;  Location: [place];  Service: Open Heart Surgery;  Laterality: N/A;  Times 3 using left internal mammary artery and endoscopically harvested right saphenous vein   . INTRAOPERATIVE TRANSESOPHAGEAL ECHOCARDIOGRAM N/A 12/15/2013   Procedure: INTRAOPERATIVE TRANSESOPHAGEAL ECHOCARDIOGRAM;  Surgeon: Gaye Pollack, MD;  Location: [facility] OR;  Service: Open Heart Surgery;  Laterality: N/A;  . 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 LEFT HEART CATHETERIZATION WITH CORONARY ANGIOGRAM N/A 07/02/2011   Procedure: LEFT HEART CATHETERIZATION WITH CORONARY ANGIOGRAM;  Surgeon: Sherren Mocha, MD;  Location: [facility] CATH LAB;  Service: Cardiovascular;  Laterality: N/A;  . LEFT HEART CATHETERIZATION WITH CORONARY ANGIOGRAM N/A 07/05/2012   Procedure: LEFT HEART CATHETERIZATION WITH CORONARY ANGIOGRAM;  Surgeon: Sherren Mocha, MD;  Location[facility] CATH LAB;  Service: Cardiovascular;  Laterality: N/A;  . LEFT HEART CATHETERIZATION  WITH CORONARY ANGIOGRAM N/A 11/28/2013   Procedure: LEFT HEART CATHETERIZATION WITH CORONARY ANGIOGRAM;  Surgeon: Blane Ohara, MD;  Location[facility] C[facility] LAB;  Service: Cardiovascular;  Laterality: N/A;  . PERCUTANEOUS CORONARY INTERVENTION-BALLOON ONLY  07/02/2011   Procedure: PERCUTANEOUS CORONARY INTERVENTION-BALLOON ONLY;  Surgeon: Sherren Mocha, MD;  Location: [facility] CATH LAB;  Service:  Cardiovascular;;  . PERCUTANEOUS CORONARY INTERVENTION-BALLOON ONLY  07/05/2012   Procedure: PERCUTANEOUS CORONARY INTERVENTION-BALLOON ONLY;  Surgeon: Sherren Mocha, MD;  Location: [facility] CATH LAB;  Service: Cardiovascular;;  . VAGINAL HYSTERECTOMY  ~ 1976   no oophorectomy    Social History Patricia [name]  reports that she quit smoking about 2 years ago. Her smoking use included Cigarettes. She has a 27.00 pack-year smoking history. She has never used smokeless tobacco. She reports that she does not drink alcohol or use drugs.  family history includes Coronary artery disease in her father; Healthy in her grandchild; Heart Problems (age of onset: 68) in her sister; Heart attack in her father; Heart attack (age of onset: 69) in her brother; Hypertension in her mother; Stroke in her mother; Suicidality (age of onset: 52) in her son.  Allergies  Allergen Reactions  . Ace Inhibitors     cough       PHYSICAL EXAMINATION: Vital signs: BP 140/62   Pulse 64   Ht 5' 2.5" (1.588 m)   Wt 130 lb 6 oz (59.1 kg)   BMI 23.47 kg/m   Constitutional: generally well-appearing, no acute distress Psychiatric: alert and oriented x3, cooperativeBut somewhat anxious Eyes: extraocular movements intact, anicteric, conjunctiva pink Mouth: oral pharynx moist, no lesions Neck: supple without thyromegaly Lymph: no lymphadenopathy Cardiovascular: heart regular rate and rhythm, no murmur Lungs: clear to auscultation bilaterally Abdomen: soft, nontender, nondistended, no obvious ascites, no peritoneal signs, normal bowel sounds, no organomegaly Rectal: Deferred until colonoscopy Extremities: no clubbing cyanosis or lower extremity edema bilaterally Skin: no lesions on visible extremities Neuro: No focal deficits. Cranial nerves intact  ASSESSMENT:  #1. Change in bowel habits manifested by more frequent loose stools with incontinence. Rule out postinfectious IBS. Rule out microscopic colitis #2. Minor  rectal bleeding as described #3. Colon cancer screening. Has not had #4. Multiple medical problems. Stable  PLAN:  #1. Increase fiber #2. Schedule colonoscopy With biopsies to provide colon cancer screening, evaluate change in bowel habits with loose stools, and minor rectal bleeding.The nature of the procedure, as well as the risks, benefits, and alternatives were carefully and thoroughly reviewed with the patient. Ample time for discussion and questions allowed. The patient understood, was satisfied, and agreed to proceed.  A copy of this consultation note has been sent to Dr. Belinda Fisher

## 2016-07-28 ENCOUNTER — Encounter: Payer: Self-pay | Admitting: Internal Medicine

## 2016-07-28 ENCOUNTER — Ambulatory Visit (AMBULATORY_SURGERY_CENTER): Payer: Medicare Other | Admitting: Internal Medicine

## 2016-07-28 VITALS — BP 139/62 | HR 77 | Temp 97.1°F | Resp 10 | Ht 62.0 in | Wt 130.0 lb

## 2016-07-28 DIAGNOSIS — R197 Diarrhea, unspecified: Secondary | ICD-10-CM | POA: Diagnosis not present

## 2016-07-28 DIAGNOSIS — R194 Change in bowel habit: Secondary | ICD-10-CM | POA: Diagnosis not present

## 2016-07-28 DIAGNOSIS — Z1211 Encounter for screening for malignant neoplasm of colon: Secondary | ICD-10-CM

## 2016-07-28 DIAGNOSIS — D123 Benign neoplasm of transverse colon: Secondary | ICD-10-CM | POA: Diagnosis not present

## 2016-07-28 DIAGNOSIS — D122 Benign neoplasm of ascending colon: Secondary | ICD-10-CM

## 2016-07-28 DIAGNOSIS — I2510 Atherosclerotic heart disease of native coronary artery without angina pectoris: Secondary | ICD-10-CM | POA: Diagnosis not present

## 2016-07-28 DIAGNOSIS — J449 Chronic obstructive pulmonary disease, unspecified: Secondary | ICD-10-CM | POA: Diagnosis not present

## 2016-07-28 DIAGNOSIS — Z951 Presence of aortocoronary bypass graft: Secondary | ICD-10-CM | POA: Diagnosis not present

## 2016-07-28 DIAGNOSIS — I10 Essential (primary) hypertension: Secondary | ICD-10-CM | POA: Diagnosis not present

## 2016-07-28 MED ORDER — SODIUM CHLORIDE 0.9 % IV SOLN: 500.0000 mL | INTRAVENOUS | Status: DC

## 2016-07-28 NOTE — Progress Notes (Signed)
Report given to PACU RN, vss 

## 2016-07-28 NOTE — Patient Instructions (Signed)
Discharge instructions given. Handouts on polyp,diverticulosis and hemorrhoids. Resume previous medications. YOU HAD AN ENDOSCOPIC PROCEDURE TODAY AT THE [facility]:   Refer to the procedure report that was given to you for any specific questions about what was found during the examination.  If the procedure report does not answer your questions, please call your gastroenterologist to clarify.  If you requested that your care partner not be given the details of your procedure findings, then the procedure report has been included in a sealed envelope for you to review at your convenience later.  YOU SHOULD EXPECT: Some feelings of bloating in the abdomen. Passage of more gas than usual.  Walking can help get rid of the air that was put into your GI tract during the procedure and reduce the bloating. If you had a lower endoscopy (such as a colonoscopy or flexible sigmoidoscopy) you may notice spotting of blood in your stool or on the toilet paper. If you underwent a bowel prep for your procedure, you may not have a normal bowel movement for a few days.  Please Note:  You might notice some irritation and congestion in your nose or some drainage.  This is from the oxygen used during your procedure.  There is no need for concern and it should clear up in a day or so.  SYMPTOMS TO REPORT IMMEDIATELY:   Following lower endoscopy (colonoscopy or flexible sigmoidoscopy):  Excessive amounts of blood in the stool  Significant tenderness or worsening of abdominal pains  Swelling of the abdomen that is new, acute  Fever of 100F or higher  For urgent or emergent issues, a gastroenterologist can be reached at any hour by calling [phone].   DIET:  We do recommend a small meal at first, but then you may proceed to your regular diet.  Drink plenty of fluids but you should avoid alcoholic beverages for 24 hours.  ACTIVITY:  You should plan to take it easy for the rest of today and you  should NOT DRIVE or use heavy machinery until tomorrow (because of the sedation medicines used during the test).    FOLLOW UP: Our staff will call the number listed on your records the next business day following your procedure to check on you and address any questions or concerns that you may have regarding the information given to you following your procedure. If we do not reach you, we will leave a message.  However, if you are feeling well and you are not experiencing any problems, there is no need to return our call.  We will assume that you have returned to your regular daily activities without incident.  If any biopsies were taken you will be contacted by phone or by letter within the next 1-3 weeks.  Please call us at [phone] if you have not heard about the biopsies in 3 weeks.    SIGNATURES/CONFIDENTIALITY: You and/or your care partner have signed paperwork which will be entered into your electronic medical record.  These signatures attest to the fact that that the information above on your After Visit Summary has been reviewed and is understood.  Full responsibility of the confidentiality of this discharge information lies with you and/or your care-partner. 

## 2016-07-28 NOTE — Progress Notes (Signed)
Called to room to assist during endoscopic procedure.  Patient ID and intended procedure confirmed with present staff. Received instructions for my participation in the procedure from the performing physician.  

## 2016-07-28 NOTE — Op Note (Signed)
[facility] Patient Name: Patricia [name] Procedure Date: 07/28/2016 10:54 AM MRN: [id number] Endoscopist: Docia Chuck. Henrene Pastor , MD Age: 75 Referring MD:  Date of Birth: Dec 13, 1940 Gender: Female Account #: [id number] Procedure:                Colonoscopy, with cold snare polypectomy X2 and                            cold biopsies Indications:              Screening for colorectal malignant                            neoplasm.Incidentally, patient noted change in                            bowel habits with frequent bowels which have been                            loose Medicines:                Monitored Anesthesia Care Procedure:                Pre-Anesthesia Assessment:                           - Prior to the procedure, a History and Physical                            was performed, and patient medications and                            allergies were reviewed. The patient's tolerance of                            previous anesthesia was also reviewed. The risks                            and benefits of the procedure and the sedation                            options and risks were discussed with the patient.                            All questions were answered, and informed consent                            was obtained. Prior Anticoagulants: The patient has                            taken no previous anticoagulant or antiplatelet                            agents. ASA Grade Assessment: II - A patient with  mild systemic disease. After reviewing the risks                            and benefits, the patient was deemed in                            satisfactory condition to undergo the procedure.                           After obtaining informed consent, the colonoscope                            was passed under direct vision. Throughout the                            procedure, the patient's blood pressure, pulse, and       oxygen saturations were monitored continuously. The                            Model CF-HQ190L [id number]) scope was introduced                            through the anus and advanced to the the cecum,                            identified by appendiceal orifice and ileocecal                            valve. The ileocecal valve, appendiceal orifice,                            and rectum were photographed. The quality of the                            bowel preparation was excellent. The colonoscopy                            was performed without difficulty. The patient                            tolerated the procedure well. The bowel preparation                            used was SUPREP. Scope In: 11:03:53 AM Scope Out: 11:15:58 AM Scope Withdrawal Time: 0 hours 6 minutes 54 seconds  Total Procedure Duration: 0 hours 12 minutes 5 seconds  Findings:                 Two semi-pedunculated polyps were found in the                            transverse colon and ascending colon. The polyps                            were  6 to 7 mm in size. These polyps were removed                            with a cold snare. Resection and retrieval were                            complete.                           Multiple small-mouthed diverticula were found in                            the sigmoid colon.                           Internal hemorrhoids were found during retroflexion.                           The entire examined colon appeared normal on direct                            and retroflexion views. Biopsies for histology were                            taken with a cold forceps from the entire colon for                            evaluation of microscopic colitis. Complications:            No immediate complications. Estimated blood loss:                            None. Estimated Blood Loss:     Estimated blood loss: none. Impression:               - Two 6 to 7 mm polyps in the  transverse colon and                            in the ascending colon, removed with a cold snare.                            Resected and retrieved.                           - Diverticulosis in the sigmoid colon.                           - Internal hemorrhoids.                           - The entire examined colon is normal on direct and                            retroflexion views. Recommendation:           - Repeat colonoscopy is not recommended for  surveillance.                           - Patient has a contact number available for                            emergencies. The signs and symptoms of potential                            delayed complications were discussed with the                            patient. Return to normal activities tomorrow.                            Written discharge instructions were provided to the                            patient.                           - Resume previous diet.                           - Continue present medications.                           - We will contact your with pathology results Docia Chuck. Henrene Pastor, MD 07/28/2016 11:24:11 AM This report has been signed electronically.

## 2016-07-29 ENCOUNTER — Telehealth: Payer: Self-pay | Admitting: *Deleted

## 2016-07-29 ENCOUNTER — Telehealth: Payer: Self-pay

## 2016-07-29 NOTE — Telephone Encounter (Signed)
  Follow up Call-  Call back number 07/28/2016  Post procedure Call Back phone  # [phone]  Permission to leave phone message Yes  Some recent data might be hidden     Patient questions:  Do you have a fever, pain , or abdominal swelling? No. Pain Score  0 *  Have you tolerated food without any problems? Yes.    Have you been able to return to your normal activities? Yes.    Do you have any questions about your discharge instructions: Diet   No. Medications  No. Follow up visit  No.  Do you have questions or concerns about your Care? No.  Actions: * If pain score is 4 or above: No action needed, pain <4.

## 2016-07-29 NOTE — Telephone Encounter (Signed)
  Follow up Call-  Call back number 07/28/2016  Post procedure Call Back phone  # [phone]  Permission to leave phone message Yes  Some recent data might be hidden     Patient questions:  Message left to call us if necessary.

## 2016-08-03 ENCOUNTER — Encounter: Payer: Self-pay | Admitting: Internal Medicine

## 2016-08-05 ENCOUNTER — Ambulatory Visit (INDEPENDENT_AMBULATORY_CARE_PROVIDER_SITE_OTHER): Payer: Medicare Other | Admitting: Cardiovascular Disease

## 2016-08-05 ENCOUNTER — Encounter: Payer: Self-pay | Admitting: Cardiovascular Disease

## 2016-08-05 VITALS — BP 144/70 | HR 72 | Ht 62.5 in | Wt 130.1 lb

## 2016-08-05 DIAGNOSIS — I739 Peripheral vascular disease, unspecified: Secondary | ICD-10-CM | POA: Diagnosis not present

## 2016-08-05 DIAGNOSIS — I2510 Atherosclerotic heart disease of native coronary artery without angina pectoris: Secondary | ICD-10-CM | POA: Diagnosis not present

## 2016-08-05 DIAGNOSIS — I10 Essential (primary) hypertension: Secondary | ICD-10-CM

## 2016-08-05 DIAGNOSIS — E785 Hyperlipidemia, unspecified: Secondary | ICD-10-CM | POA: Diagnosis not present

## 2016-08-05 DIAGNOSIS — I6523 Occlusion and stenosis of bilateral carotid arteries: Secondary | ICD-10-CM | POA: Diagnosis not present

## 2016-08-05 NOTE — Patient Instructions (Signed)
Medication Instructions:  Your physician recommends that you continue on your current medications as directed. Please refer to the Current Medication list given to you today.  Labwork: No new orders.   Testing/Procedures: Your physician has requested that you have a carotid duplex in 1 YEAR. This test is an ultrasound of the carotid arteries in your neck. It looks at blood flow through these arteries that supply the brain with blood. Allow one hour for this exam. There are no restrictions or special instructions.  Your physician has requested that you have a lower extremity arterial duplex in 1 YEAR. This test is an ultrasound of the arteries in the legs. It looks at arterial blood flow in the legs. Allow one hour for Lower Arterial scans. There are no restrictions or special instructions  Follow-Up: Your physician wants you to follow-up in: 1 YEAR with Dr Burt Knack.  You will receive a reminder letter in the mail two months in advance. If you don't receive a letter, please call our office to schedule the follow-up appointment.   Any Other Special Instructions Will Be Listed Below (If Applicable).     If you need a refill on your cardiac medications before your next appointment, please call your pharmacy.

## 2016-08-05 NOTE — Progress Notes (Signed)
Cardiology Office Note Date:  08/05/2016   ID:  Patricia [name], Patricia [name] Jan 16, 1941, MRN [id number]  PCP:  Kathlene November, MD  Cardiologist:  Sherren Mocha, MD    Chief Complaint  Patient presents with  . carotid artery disease     History of Present Illness: Patricia [name] is a 75 y.o. female who presents for Follow-up of coronary artery disease. The patient has undergone multiple PCI procedures in the past. She continued to have refractory angina with class III symptoms and ultimately underwent multivessel CABG in 2015.  She continues to do well. Has had complete relief of her angina since undergoing CABG. she does have some limitation from shortness of breath and has been a long-standing smoker. She did quit smoking at the time of CABG. She denies orthopnea, PND, heart palpitations, or leg swelling. She's had no chest pain or pressure since CABG. Overall feels she is doing well. She does complain of fatigue in both legs with extended walking. She denies any symptoms of calf claudication.  Past Medical History:  Diagnosis Date  . Abnormal LFTs   . Allergic rhinitis   . Anxiety and depression    son commited suicide 2010  . 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 CAD (coronary artery disease)    s/p CABG  . Carotid artery disease ([place])    Moderate - f/u dopplers needed 11/2012  . 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 Zadie Rhine  . COPD (chronic obstructive pulmonary disease) ([place])   . HTN (hypertension)    intolerant to ACEi (cough)  . Hyperlipidemia   . Osteopenia   . PAD (peripheral artery disease) ([place])    Carotid dz as  below, and at [facility] 11/12:  critical stenosis noted just below the sheath site leading into a totally occluded SFA and a patent deep femoral artery  . Panic attacks   . Thyroid nodule     Past Surgical History:  Procedure Laterality Date  . CORONARY ANGIOPLASTY WITH STENT PLACEMENT     "1; makes total of 3"  (07/05/2012)  . CORONARY ARTERY BYPASS GRAFT N/A 12/15/2013   Procedure: CORONARY ARTERY BYPASS GRAFTING (CABG);  Surgeon: Gaye Pollack, MD;  Location: [place];  Service: Open Heart Surgery;  Laterality: N/A;  Times 3 using left internal mammary artery and endoscopically harvested right saphenous vein   . INTRAOPERATIVE TRANSESOPHAGEAL ECHOCARDIOGRAM N/A 12/15/2013   Procedure: INTRAOPERATIVE TRANSESOPHAGEAL ECHOCARDIOGRAM;  Surgeon: Gaye Pollack, MD;  Location: [facility] OR;  Service: Open Heart Surgery;  Laterality: N/A;  . 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 LEFT HEART CATHETERIZATION WITH CORONARY ANGIOGRAM N/A 07/02/2011   Procedure: LEFT HEART CATHETERIZATION WITH CORONARY ANGIOGRAM;  Surgeon: Sherren Mocha, MD;  Location: [facility] CATH LAB;  Service: Cardiovascular;  Laterality: N/A;  . LEFT HEART CATHETERIZATION WITH CORONARY ANGIOGRAM N/A 07/05/2012   Procedure: LEFT HEART CATHETERIZATION WITH CORONARY ANGIOGRAM;  Surgeon: Sherren Mocha, MD;  Location: [facility] CATH LAB;  Service: Cardiovascular;  Laterality: N/A;  . LEFT HEART CATHETERIZATION WITH CORONARY ANGIOGRAM N/A 11/28/2013   Procedure: LEFT HEART CATHETERIZATION WITH CORONARY ANGIOGRAM;  Surgeon: Blane Ohara, MD;  Location: [facility] CATH LAB;  Service: Cardiovascular;  Laterality: N/A;  . PERCUTANEOUS CORONARY INTERVENTION-BALLOON ONLY  07/02/2011   Procedure: PERCUTANEOUS CORONARY INTERVENTION-BALLOON ONLY;  Surgeon: Legrand Como  Burt Knack, MD;  Location: [facility] CATH LAB;  Service: Cardiovascular;;  . PERCUTANEOUS CORONARY INTERVENTION-BALLOON ONLY  07/05/2012   Procedure: PERCUTANEOUS CORONARY INTERVENTION-BALLOON ONLY;  Surgeon: Sherren Mocha, MD;  Location: [facility] CATH LAB;  Service: Cardiovascular;;  . VAGINAL HYSTERECTOMY  ~ 1976   no oophorectomy    Current Outpatient Prescriptions  Medication Sig Dispense Refill  . Aflibercept (EYLEA) 2 MG/0.05ML SOLN  Inject 2 mg into the eye every 5 (five) weeks. Dr. Zadie Rhine    . amLODipine (NORVASC) 5 MG tablet Take 1 tablet (5 mg total) by mouth daily. 30 tablet 5  . aspirin 81 MG EC tablet Take 1 tablet (81 mg total) by mouth daily.    Marland Kitchen atorvastatin (LIPITOR) 40 MG tablet Take 1 tablet (40 mg total) by mouth at bedtime. 90 tablet 2  . Cholecalciferol (VITAMIN D3) 1000 UNITS CAPS Take 1,000 Units by mouth daily.    . clonazePAM (KLONOPIN) 0.5 MG tablet Take 0.25-0.5 mg by mouth 2 (two) times daily as needed for anxiety (or sleep).    Marland Kitchen FLUoxetine (PROZAC) 20 MG capsule Take 2 capsules (40 mg total) by mouth daily. 180 capsule 1  . furosemide (LASIX) 40 MG tablet Take 40 mg by mouth daily as needed for edema.    Marland Kitchen KLOR-CON M20 20 MEQ tablet Take 1 tablet by mouth daily as needed. Take it along with lasix    . losartan (COZAAR) 100 MG tablet TAKE ONE TABLET BY MOUTH ONCE DAILY 90 tablet 2  . tiotropium (SPIRIVA HANDIHALER) 18 MCG inhalation capsule Place 1 capsule (18 mcg total) into inhaler and inhale daily. 30 capsule 6   Current Facility-Administered Medications  Medication Dose Route Frequency Provider Last Rate Last Dose  . 0.9 %  sodium chloride infusion  500 mL Intravenous Continuous Patricia Shipper, MD      . 0.9 %  sodium chloride infusion  500 mL Intravenous Continuous Patricia Shipper, MD        Allergies:   Ace inhibitors   Social History:  The patient  reports that she quit smoking about 2 years ago. Her smoking use included Cigarettes. She has a 27.00 pack-year smoking history. She has never used smokeless tobacco. She reports that she does not drink alcohol or use drugs.   Family History:  The patient's  family history includes Coronary artery disease in her father; Healthy in her grandchild; Heart Problems (age of onset: 49) in her sister; Heart attack in her father; Heart attack (age of onset: 77) in her brother; Hypertension in her mother; Stroke in her mother; Suicidality (age of onset: 22) in  her son.    ROS:  Please see the history of present illness.  All other systems are reviewed and negative.    PHYSICAL EXAM: VS:  BP (!) 144/70   Pulse 72   Ht 5' 2.5" (1.588 m)   Wt 130 lb 1.9 oz (59 kg)   BMI 23.42 kg/m  , BMI Body mass index is 23.42 kg/m. GEN: Well nourished, well developed, in no acute distress  HEENT: normal  Neck: no JVD, no masses. Right carotid bruit Cardiac: RRR without murmur or gallop                Respiratory:  clear to auscultation bilaterally, normal work of breathing GI: soft, nontender, nondistended, + BS MS: no deformity or atrophy  Ext: no pretibial edema Skin: warm and dry, no rash Neuro:  Strength and sensation are intact Psych: euthymic mood, full  affect  EKG:  EKG is ordered today. The ekg ordered today shows normal sinus rhythm 72 bpm, within normal limits.  Recent Labs: 05/13/2016: ALT 14; BUN 14; Creatinine, Ser 0.59; Hemoglobin 14.2; Platelets 266.0; Potassium 3.8; Sodium 142   Lipid Panel     Component Value Date/Time   CHOL 143 05/13/2016 1144   TRIG 81.0 05/13/2016 1144   HDL 57.90 05/13/2016 1144   CHOLHDL 2 05/13/2016 1144   VLDL 16.2 05/13/2016 1144   LDLCALC 69 05/13/2016 1144   LDLDIRECT 156.0 04/26/2007 0936      Wt Readings from Last 3 Encounters:  08/05/16 130 lb 1.9 oz (59 kg)  07/28/16 130 lb (59 kg)  07/16/16 130 lb 6 oz (59.1 kg)    ASSESSMENT AND PLAN: 1.  CAD, native vessel: no angina. Pt s/p CABG and doing very well. Medication reviewed and no changes recommended.   2. Hyperlipidemia: Most recent lipids reviewed with LDL cholesterol 69 mg/dL. She continues on atorvastatin.  3. Essential hypertension: Blood pressure is controlled on amlodipine and losartan  4. Carotid stenosis without history of stroke: Her last Doppler study is reviewed and recommend 1 year follow-up. She has moderate carotid stenosis.  5. Lower extremity peripheral arterial disease: No symptoms of claudication. ABIs are 0.85 on  the left and 0.53 on the right. She has total occlusion of the right SFA. With no evidence of resting ischemic changes and no claudication symptoms, she is appropriate for medical therapy. Will repeat Doppler studies next year.  Current medicines are reviewed with the patient today.  The patient does not have concerns regarding medicines.  Labs/ tests ordered today include:   Orders Placed This Encounter  Procedures  . EKG 12-Lead    Disposition:   FU one year  Signed, Sherren Mocha, MD  08/05/2016 4:34 PM    [facility] [address], [place], [place]  [address] Phone: [phone]; Fax: [phone]

## 2016-08-07 ENCOUNTER — Other Ambulatory Visit: Payer: Self-pay | Admitting: Internal Medicine

## 2016-08-07 DIAGNOSIS — H34812 Central retinal vein occlusion, left eye, with macular edema: Secondary | ICD-10-CM | POA: Diagnosis not present

## 2016-08-15 NOTE — Progress Notes (Deleted)
Pre visit review using our clinic review tool, if applicable. No additional management support is needed unless otherwise documented below in the visit note. 

## 2016-08-15 NOTE — Progress Notes (Deleted)
Subjective:   Patricia [name] is a 76 y.o. female who presents for Medicare Annual (Subsequent) preventive examination.  Review of Systems:  No ROS.  Medicare Wellness Visit.   Sleep patterns: {SX; SLEEP PATTERNS:18802::"feels rested on waking","does not get up to void","gets up *** times nightly to void","*** hours nightly"}.   Home Safety/Smoke Alarms:   Living environment; residence and Firearm Safety: {Rehab home environment / accessibility:30080::"no firearms","firearms stored safely"}. Seat Belt Safety/Bike Helmet: Wears seat belt.   Counseling:   Eye Exam-  Dental-  Female:   Pap- hysterectomy      Mammo- Last 05/08/10:BI-RADS CATEGORY 1:  Negative.       Dexa scan- Last 11/22/12: Osteopenia. Repeat in 2-3 yrs per report. CCS- Last 07/28/16: Normal. No f/u on file.     Objective:     Vitals: There were no vitals taken for this visit.  There is no height or weight on file to calculate BMI.   Tobacco History  Smoking Status  . Former Smoker  . Packs/day: 0.50  . Years: 54.00  . Types: Cigarettes  . Quit date: 12/15/2013  Smokeless Tobacco  . Never Used    Comment: quit tobacco 12-2013 after CABG     Counseling given: Not Answered   Past Medical History:  Diagnosis Date  . Abnormal LFTs   . Allergic rhinitis   . Anxiety and depression    son commited suicide 2010  . 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 CAD (coronary artery disease)    s/p CABG  . Carotid artery disease ([place])    Moderate - f/u dopplers needed 11/2012  . 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 Zadie Rhine  . COPD (chronic obstructive pulmonary disease) ([place])   . HTN (hypertension)    intolerant to ACEi (cough)  . Hyperlipidemia   . Osteopenia   . PAD (peripheral artery disease) ([place])    Carotid dz as  below, and at [facility] 11/12:  critical stenosis noted just below the sheath site leading into a totally occluded SFA and a patent deep femoral  artery  . Panic attacks   . Thyroid nodule    Past Surgical History:  Procedure Laterality Date  . CORONARY ANGIOPLASTY WITH STENT PLACEMENT     "1; makes total of 3" (07/05/2012)  . CORONARY ARTERY BYPASS GRAFT N/A 12/15/2013   Procedure: CORONARY ARTERY BYPASS GRAFTING (CABG);  Surgeon: Gaye Pollack, MD;  Location: [place];  Service: Open Heart Surgery;  Laterality: N/A;  Times 3 using left internal mammary artery and endoscopically harvested right saphenous vein   . INTRAOPERATIVE TRANSESOPHAGEAL ECHOCARDIOGRAM N/A 12/15/2013   Procedure: INTRAOPERATIVE TRANSESOPHAGEAL ECHOCARDIOGRAM;  Surgeon: Gaye Pollack, MD;  Location: [facility] OR;  Service: Open Heart Surgery;  Laterality: N/A;  . 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 LEFT HEART CATHETERIZATION WITH CORONARY ANGIOGRAM N/A 07/02/2011   Procedure: LEFT HEART CATHETERIZATION WITH CORONARY ANGIOGRAM;  Surgeon: Sherren Mocha, MD;  Location: [facility] CATH LAB;  Service: Cardiovascular;  Laterality: N/A;  . LEFT HEART CATHETERIZATION WITH CORONARY ANGIOGRAM N/A 07/05/2012   Procedure: LEFT HEART CATHETERIZATION WITH CORONARY ANGIOGRAM;  Surgeon: Sherren Mocha, MD;  Location: [facility] CATH LAB;  Service: Cardiovascular;  Laterality: N/A;  . LEFT HEART CATHETERIZATION WITH CORONARY ANGIOGRAM N/A 11/28/2013   Procedure: LEFT HEART  CATHETERIZATION WITH CORONARY ANGIOGRAM;  Surgeon: Blane Ohara, MD;  Location: [facility] CATH LAB;  Service: Cardiovascular;  Laterality: N/A;  . PERCUTANEOUS CORONARY INTERVENTION-BALLOON ONLY  07/02/2011   Procedure: PERCUTANEOUS CORONARY INTERVENTION-BALLOON ONLY;  Surgeon: Sherren Mocha, MD;  Location: [facility] CATH LAB;  Service: Cardiovascular;;  . PERCUTANEOUS CORONARY INTERVENTION-BALLOON ONLY  07/05/2012   Procedure: PERCUTANEOUS CORONARY INTERVENTION-BALLOON ONLY;  Surgeon: Sherren Mocha, MD;  Location: [facility] CATH LAB;  Service:  Cardiovascular;;  . VAGINAL HYSTERECTOMY  ~ 1976   no oophorectomy   Family History  Problem Relation Age of Onset  . Hypertension Mother   . Stroke Mother   . Coronary artery disease Father     CAD in female relative <50  . Heart attack Father   . Heart attack Brother 59  . Heart Problems Sister 13    perminant pacemaker  . Suicidality Son 28    2010  . Healthy Grandchild   . Colon cancer Neg Hx   . Breast cancer Neg Hx   . Stomach cancer Neg Hx   . Rectal cancer Neg Hx   . Esophageal cancer Neg Hx   . Liver cancer Neg Hx    History  Sexual Activity  . Sexual activity: No    Outpatient Encounter Prescriptions as of 08/18/2016  Medication Sig  . Aflibercept (EYLEA) 2 MG/0.05ML SOLN Inject 2 mg into the eye every 5 (five) weeks. Dr. Zadie Rhine  . amLODipine (NORVASC) 5 MG tablet Take 1 tablet (5 mg total) by mouth daily.  Marland Kitchen aspirin 81 MG EC tablet Take 1 tablet (81 mg total) by mouth daily.  Marland Kitchen atorvastatin (LIPITOR) 40 MG tablet Take 1 tablet (40 mg total) by mouth at bedtime.  . Cholecalciferol (VITAMIN D3) 1000 UNITS CAPS Take 1,000 Units by mouth daily.  . clonazePAM (KLONOPIN) 0.5 MG tablet Take 0.25-0.5 mg by mouth 2 (two) times daily as needed for anxiety (or sleep).  Marland Kitchen FLUoxetine (PROZAC) 20 MG capsule Take 2 capsules (40 mg total) by mouth daily.  . furosemide (LASIX) 40 MG tablet Take 40 mg by mouth daily as needed for edema.  Marland Kitchen KLOR-CON M20 20 MEQ tablet Take 1 tablet by mouth daily as needed. Take it along with lasix  . losartan (COZAAR) 100 MG tablet TAKE ONE TABLET BY MOUTH ONCE DAILY  . tiotropium (SPIRIVA HANDIHALER) 18 MCG inhalation capsule Place 1 capsule (18 mcg total) into inhaler and inhale daily.   Facility-Administered Encounter Medications as of 08/18/2016  Medication  . 0.9 %  sodium chloride infusion  . 0.9 %  sodium chloride infusion    Activities of Daily Living In your present state of health, do you have any difficulty performing the following  activities: 05/13/2016  Hearing? N  Vision? N  Difficulty concentrating or making decisions? N  Walking or climbing stairs? N  Dressing or bathing? N  Doing errands, shopping? N  Some recent data might be hidden    Patient Care Team: Colon Branch, MD as PCP - General Rigoberto Noel, MD as Consulting Physician (Pulmonary Disease) Sherren Mocha, MD as Consulting Physician (Cardiology) Hurman Horn, MD as Consulting Physician (Ophthalmology) Burtis Junes, NP as Nurse Practitioner (Cardiology)    Assessment:    Physical assessment deferred to PCP.  Exercise Activities and Dietary recommendations   Diet (meal preparation, eat out, water intake, caffeinated beverages, dairy products, fruits and vegetables): {Desc; diets:16563} Breakfast: Lunch:  Dinner:      Goals    None  Fall Risk Fall Risk  05/13/2016 04/10/2016 01/26/2015 01/25/2014 09/06/2012  Falls in the past year? No No No No No   Depression Screen PHQ 2/9 Scores 05/13/2016 05/28/2015 01/26/2015 01/25/2014  PHQ - 2 Score 0 0 0 0     Cognitive Function        Immunization History  Administered Date(s) Administered  . Influenza Split 07/24/2011, 04/30/2012  . Influenza Whole 06/14/2007, 06/08/2008, 05/18/2009, 05/09/2010  . Influenza, High Dose Seasonal PF 04/11/2013, 05/13/2016  . Influenza,inj,Quad PF,36+ Mos 05/09/2014, 05/28/2015  . Pneumococcal Conjugate-13 04/06/2014  . Pneumococcal Polysaccharide-23 08/11/2005, 07/18/2010  . Td 01/25/2007   Screening Tests Health Maintenance  Topic Date Due  . ZOSTAVAX  02/26/2000  . TETANUS/TDAP  01/24/2017  . INFLUENZA VACCINE  Completed  . DEXA SCAN  Completed  . PNA vac Low Risk Adult  Completed      Plan:   *** During the course of the visit the patient was educated and counseled about the following appropriate screening and preventive services:   Vaccines to include Pneumoccal, Influenza, Hepatitis B, Td, Zostavax,  HCV  Electrocardiogram  Cardiovascular Disease  Colorectal cancer screening  Bone density screening  Diabetes screening  Glaucoma screening  Mammography/PAP  Nutrition counseling   Patient Instructions (the written plan) was given to the patient.   Shela Nevin, [place]  08/15/2016

## 2016-08-18 ENCOUNTER — Ambulatory Visit: Payer: Medicare Other | Admitting: *Deleted

## 2016-08-21 ENCOUNTER — Encounter: Payer: Self-pay | Admitting: *Deleted

## 2016-08-21 ENCOUNTER — Ambulatory Visit (INDEPENDENT_AMBULATORY_CARE_PROVIDER_SITE_OTHER): Payer: Medicare Other | Admitting: *Deleted

## 2016-08-21 VITALS — BP 136/62 | HR 79 | Ht 63.0 in | Wt 131.8 lb

## 2016-08-21 DIAGNOSIS — Z0000 Encounter for general adult medical examination without abnormal findings: Secondary | ICD-10-CM

## 2016-08-21 NOTE — Progress Notes (Addendum)
Subjective:   Patricia [name] is a 76 y.o. female who presents for Medicare Annual (Subsequent) preventive examination.  Review of Systems:  No ROS.  Medicare Wellness Visit. Cardiac Risk Factors include: advanced age (>64men, >45 women);dyslipidemia;hypertension;sedentary lifestyle Sleep patterns:Wakes 1-3x/ night to urinate.Sleeps at least 7 hrs per night. Feels rested. Naps occasionally. Home Safety/Smoke Alarms: Feels safe in home. Smoke alarms in place.  Living environment; residence and Firearm Safety: Lives with grandson and his girlfriend. Lives in one story home.  Seat Belt Safety/Bike Helmet: Wears seat belt.   Counseling:   Eye Exam- Follows with Dr.Groat annually.  Dental- Upper dentures. Lower bridge. Discussed gum care.  Female:   Pap-  Hysterectomy.     Mammo-  Last 05/08/10:   BI-RADS CATEGORY 1:  Negative. Pt no longer getting screened. Dexa scan-  Last 11/22/12: Osteopenia. Repeat in 2-46yrs per report.   Pt declines reorder today.   CCS- Last  07/28/16: Normal. Repeat not recommended for surveillance per report.    Objective:     Vitals: BP 136/62 (BP Location: Right Arm, Patient Position: Sitting, Cuff Size: Normal)   Pulse 79   Ht 5\' 3"  (1.6 m)   Wt 131 lb 12.8 oz (59.8 kg)   SpO2 96%   BMI 23.35 kg/m   Body mass index is 23.35 kg/m.   Tobacco History  Smoking Status  . Former Smoker  . Packs/day: 0.50  . Years: 54.00  . Types: Cigarettes  . Quit date: 12/15/2013  Smokeless Tobacco  . Never Used    Comment: quit tobacco 12-2013 after CABG     Counseling given: No   Past Medical History:  Diagnosis Date  . Abnormal LFTs   . Allergic rhinitis   . Anxiety and depression    son commited suicide 2010  . 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 CAD (coronary artery disease)    s/p CABG  . Carotid artery disease ([place])    Moderate - f/u dopplers needed 11/2012  . Central retinal vein occlusion of left eye 02/2015   Receiving injections in eye every 5 weeks  by Dr. Zadie Rhine  . COPD (chronic obstructive pulmonary disease) ([place])   . HTN (hypertension)    intolerant to ACEi (cough)  . Hyperlipidemia   . Osteopenia   . PAD (peripheral artery disease) ([place])    Carotid dz as  below, and at [facility] 11/12:  critical stenosis noted just below the sheath site leading into a totally occluded SFA and a patent deep femoral artery  . Panic attacks   . Thyroid nodule    Past Surgical History:  Procedure Laterality Date  . CORONARY ANGIOPLASTY WITH STENT PLACEMENT     "1; makes total of 3" (07/05/2012)  . CORONARY ARTERY BYPASS GRAFT N/A 12/15/2013   Procedure: CORONARY ARTERY BYPASS GRAFTING (CABG);  Surgeon: Gaye Pollack, MD;  Location: [place];  Service: Open Heart Surgery;  Laterality: N/A;  Times 3 using left internal mammary artery and endoscopically harvested right saphenous vein   . INTRAOPERATIVE TRANSESOPHAGEAL ECHOCARDIOGRAM N/A 12/15/2013   Procedure: INTRAOPERATIVE TRANSESOPHAGEAL ECHOCARDIOGRAM;  Surgeon: Gaye Pollack, MD;  Location: [facility] OR;  Service: Open Heart Surgery;  Laterality: N/A;  . 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 LEFT HEART CATHETERIZATION WITH CORONARY ANGIOGRAM N/A 07/02/2011   Procedure: LEFT HEART CATHETERIZATION WITH CORONARY ANGIOGRAM;  Surgeon: Sherren Mocha, MD;  Location[facility] C[facility] LAB;  Service: Cardiovascular;  Laterality: N/A;  . LEFT HEART CATHETERIZATION WITH CORONARY ANGIOGRAM N/A 07/05/2012   Procedure: LEFT HEART CATHETERIZATION WITH CORONARY ANGIOGRAM;  Surgeon: Sherren Mocha, MD;  Location: [facility] CATH LAB;  Service: Cardiovascular;  Laterality: N/A;  . LEFT HEART CATHETERIZATION WITH CORONARY ANGIOGRAM N/A 11/28/2013   Procedure: LEFT HEART CATHETERIZATION WITH CORONARY ANGIOGRAM;  Surgeon: Blane Ohara, MD;  Location: [facility] CATH LAB;  Service: Cardiovascular;  Laterality: N/A;  . PERCUTANEOUS CORONARY INTERVENTION-BALLOON  ONLY  07/02/2011   Procedure: PERCUTANEOUS CORONARY INTERVENTION-BALLOON ONLY;  Surgeon: Sherren Mocha, MD;  Location: [facility] CATH LAB;  Service: Cardiovascular;;  . PERCUTANEOUS CORONARY INTERVENTION-BALLOON ONLY  07/05/2012   Procedure: PERCUTANEOUS CORONARY INTERVENTION-BALLOON ONLY;  Surgeon: Sherren Mocha, MD;  Location: [facility] CATH LAB;  Service: Cardiovascular;;  . VAGINAL HYSTERECTOMY  ~ 1976   no oophorectomy   Family History  Problem Relation Age of Onset  . Hypertension Mother   . Stroke Mother   . Coronary artery disease Father     CAD in female relative <50  . Heart attack Father   . Heart attack Brother 24  . Heart Problems Sister 12    perminant pacemaker  . Suicidality Son 24    2010  . Healthy Grandchild   . Colon cancer Neg Hx   . Breast cancer Neg Hx   . Stomach cancer Neg Hx   . Rectal cancer Neg Hx   . Esophageal cancer Neg Hx   . Liver cancer Neg Hx    History  Sexual Activity  . Sexual activity: No    Outpatient Encounter Prescriptions as of 08/21/2016  Medication Sig  . Aflibercept (EYLEA) 2 MG/0.05ML SOLN Inject 2 mg into the eye every 5 (five) weeks. Dr. Zadie Rhine  . amLODipine (NORVASC) 5 MG tablet Take 1 tablet (5 mg total) by mouth daily.  Marland Kitchen aspirin 81 MG EC tablet Take 1 tablet (81 mg total) by mouth daily.  Marland Kitchen atorvastatin (LIPITOR) 40 MG tablet Take 1 tablet (40 mg total) by mouth at bedtime.  . Cholecalciferol (VITAMIN D3) 1000 UNITS CAPS Take 1,000 Units by mouth daily.  . clonazePAM (KLONOPIN) 0.5 MG tablet Take 0.25-0.5 mg by mouth 2 (two) times daily as needed for anxiety (or sleep).  Marland Kitchen FLUoxetine (PROZAC) 20 MG capsule Take 2 capsules (40 mg total) by mouth daily.  . furosemide (LASIX) 40 MG tablet Take 40 mg by mouth daily as needed for edema.  Marland Kitchen KLOR-CON M20 20 MEQ tablet Take 1 tablet by mouth daily as needed. Take it along with lasix  . losartan (COZAAR) 100 MG tablet TAKE ONE TABLET BY MOUTH ONCE DAILY  . tiotropium (SPIRIVA HANDIHALER) 18 MCG  inhalation capsule Place 1 capsule (18 mcg total) into inhaler and inhale daily. (Patient taking differently: Place 18 mcg into inhaler and inhale as needed. )   Facility-Administered Encounter Medications as of 08/21/2016  Medication  . 0.9 %  sodium chloride infusion  . 0.9 %  sodium chloride infusion    Activities of Daily Living In your present state of health, do you have any difficulty performing the following activities: 08/21/2016 05/13/2016  Hearing? N N  Vision? N N  Difficulty concentrating or making decisions? N N  Walking or climbing stairs? N N  Dressing or bathing? N N  Doing errands, shopping? N N  Preparing Food and eating ? N -  Using the Toilet? N -  In the past six  months, have you accidently leaked urine? N -  Do you have problems with loss of bowel control? N -  Managing your Medications? N -  Managing your Finances? N -  Housekeeping or managing your Housekeeping? N -  Some recent data might be hidden    Patient Care Team: Colon Branch, MD as PCP - Chandler, MD as Consulting Physician (Pulmonary Disease) Sherren Mocha, MD as Consulting Physician (Cardiology) Hurman Horn, MD as Consulting Physician (Ophthalmology) Burtis Junes, NP as Nurse Practitioner (Cardiology)    Assessment:    Physical assessment deferred to PCP.  Exercise Activities and Dietary recommendations Current Exercise Habits: The patient does not participate in regular exercise at present, Exercise limited by: None identified   Diet (meal preparation, eat out, water intake, caffeinated beverages, dairy products, fruits and vegetables): in general, a "healthy" diet     Goals    . Maintain Lifestyle      Fall Risk Fall Risk  08/21/2016 05/13/2016 04/10/2016 01/26/2015 01/25/2014  Falls in the past year? No No No No No   Depression Screen PHQ 2/9 Scores 08/21/2016 05/13/2016 05/28/2015 01/26/2015  PHQ - 2 Score 0 0 0 0     Cognitive Function MMSE - Mini Mental State  Exam 08/21/2016  Orientation to time 5  Orientation to Place 5  Registration 3  Attention/ Calculation 5  Recall 3  Language- name 2 objects 2  Language- repeat 1  Language- follow 3 step command 3  Language- read & follow direction 1  Write a sentence 1  Copy design 1  Total score 30        Immunization History  Administered Date(s) Administered  . Influenza Split 07/24/2011, 04/30/2012  . Influenza Whole 06/14/2007, 06/08/2008, 05/18/2009, 05/09/2010  . Influenza, High Dose Seasonal PF 04/11/2013, 05/13/2016  . Influenza,inj,Quad PF,36+ Mos 05/09/2014, 05/28/2015  . Pneumococcal Conjugate-13 04/06/2014  . Pneumococcal Polysaccharide-23 08/11/2005, 07/18/2010  . Td 01/25/2007   Screening Tests Health Maintenance  Topic Date Due  . ZOSTAVAX  08/21/2017 (Originally 02/26/2000)  . TETANUS/TDAP  01/24/2017  . INFLUENZA VACCINE  Completed  . DEXA SCAN  Completed  . PNA vac Low Risk Adult  Completed      Plan:     Continue to eat heart healthy diet (full of fruits, vegetables, whole grains, lean protein, water--limit salt, fat, and sugar intake) and increase physical activity as tolerated. Continue doing brain stimulating activities (puzzles, reading, adult coloring books, staying active) to keep memory sharp.   During the course of the visit the patient was educated and counseled about the following appropriate screening and preventive services:   Vaccines to include Pneumoccal, Influenza, Hepatitis B, Td, Zostavax, HCV: UTD  Cardiovascular Disease   Colorectal cancer screening  Bone density screening: Pt declines reorder today.  Diabetes screening  Glaucoma screening  Mammography/PAP: Pt states she is no longer receiving these screenings.  Nutrition counseling   Patient Instructions (the written plan) was given to the patient.   Naaman Plummer [place], [place]  08/21/2016  Kathlene November, MD

## 2016-08-21 NOTE — Patient Instructions (Addendum)
Continue to eat heart healthy diet (full of fruits, vegetables, whole grains, lean protein, water--limit salt, fat, and sugar intake) and increase physical activity as tolerated.  Continue doing brain stimulating activities (puzzles, reading, adult coloring books, staying active) to keep memory sharp.   Health Maintenance, Female Introduction Adopting a healthy lifestyle and getting preventive care can go a long way to promote health and wellness. Talk with your health care provider about what schedule of regular examinations is right for you. This is a good chance for you to check in with your provider about disease prevention and staying healthy. In between checkups, there are plenty of things you can do on your own. Experts have done a lot of research about which lifestyle changes and preventive measures are most likely to keep you healthy. Ask your health care provider for more information. Weight and diet Eat a healthy diet  Be sure to include plenty of vegetables, fruits, low-fat dairy products, and lean protein.  Do not eat a lot of foods high in solid fats, added sugars, or salt.  Get regular exercise. This is one of the most important things you can do for your health.  Most adults should exercise for at least 150 minutes each week. The exercise should increase your heart rate and make you sweat (moderate-intensity exercise).  Most adults should also do strengthening exercises at least twice a week. This is in addition to the moderate-intensity exercise. Maintain a healthy weight  Body mass index (BMI) is a measurement that can be used to identify possible weight problems. It estimates body fat based on height and weight. Your health care provider can help determine your BMI and help you achieve or maintain a healthy weight.  For females 20 years of age and older:  A BMI below 18.5 is considered underweight.  A BMI of 18.5 to 24.9 is normal.  A BMI of 25 to 29.9 is considered  overweight.  A BMI of 30 and above is considered obese. Watch levels of cholesterol and blood lipids  You should start having your blood tested for lipids and cholesterol at 77 years of age, then have this test every 5 years.  You may need to have your cholesterol levels checked more often if:  Your lipid or cholesterol levels are high.  You are older than 77 years of age.  You are at high risk for heart disease. Cancer screening Lung Cancer  Lung cancer screening is recommended for adults 55-80 years old who are at high risk for lung cancer because of a history of smoking.  A yearly low-dose CT scan of the lungs is recommended for people who:  Currently smoke.  Have quit within the past 15 years.  Have at least a 30-pack-year history of smoking. A pack year is smoking an average of one pack of cigarettes a day for 1 year.  Yearly screening should continue until it has been 15 years since you quit.  Yearly screening should stop if you develop a health problem that would prevent you from having lung cancer treatment. Breast Cancer  Practice breast self-awareness. This means understanding how your breasts normally appear and feel.  It also means doing regular breast self-exams. Let your health care provider know about any changes, no matter how small.  If you are in your 20s or 30s, you should have a clinical breast exam (CBE) by a health care provider every 1-3 years as part of a regular health exam.  If you are 40   or older, have a CBE every year. Also consider having a breast X-ray (mammogram) every year.  If you have a family history of breast cancer, talk to your health care provider about genetic screening.  If you are at high risk for breast cancer, talk to your health care provider about having an MRI and a mammogram every year.  Breast cancer gene (BRCA) assessment is recommended for women who have family members with BRCA-related cancers. BRCA-related cancers  include:  Breast.  Ovarian.  Tubal.  Peritoneal cancers.  Results of the assessment will determine the need for genetic counseling and BRCA1 and BRCA2 testing. Cervical Cancer  Your health care provider may recommend that you be screened regularly for cancer of the pelvic organs (ovaries, uterus, and vagina). This screening involves a pelvic examination, including checking for microscopic changes to the surface of your cervix (Pap test). You may be encouraged to have this screening done every 3 years, beginning at age 21.  For women ages 30-65, health care providers may recommend pelvic exams and Pap testing every 3 years, or they may recommend the Pap and pelvic exam, combined with testing for human papilloma virus (HPV), every 5 years. Some types of HPV increase your risk of cervical cancer. Testing for HPV may also be done on women of any age with unclear Pap test results.  Other health care providers may not recommend any screening for nonpregnant women who are considered low risk for pelvic cancer and who do not have symptoms. Ask your health care provider if a screening pelvic exam is right for you.  If you have had past treatment for cervical cancer or a condition that could lead to cancer, you need Pap tests and screening for cancer for at least 20 years after your treatment. If Pap tests have been discontinued, your risk factors (such as having a new sexual partner) need to be reassessed to determine if screening should resume. Some women have medical problems that increase the chance of getting cervical cancer. In these cases, your health care provider may recommend more frequent screening and Pap tests. Colorectal Cancer  This type of cancer can be detected and often prevented.  Routine colorectal cancer screening usually begins at 77 years of age and continues through 77 years of age.  Your health care provider may recommend screening at an earlier age if you have risk factors  for colon cancer.  Your health care provider may also recommend using home test kits to check for hidden blood in the stool.  A small camera at the end of a tube can be used to examine your colon directly (sigmoidoscopy or colonoscopy). This is done to check for the earliest forms of colorectal cancer.  Routine screening usually begins at age 50.  Direct examination of the colon should be repeated every 5-10 years through 77 years of age. However, you may need to be screened more often if early forms of precancerous polyps or small growths are found. Skin Cancer  Check your skin from head to toe regularly.  Tell your health care provider about any new moles or changes in moles, especially if there is a change in a mole's shape or color.  Also tell your health care provider if you have a mole that is larger than the size of a pencil eraser.  Always use sunscreen. Apply sunscreen liberally and repeatedly throughout the day.  Protect yourself by wearing long sleeves, pants, a wide-brimmed hat, and sunglasses whenever you are outside. Heart   disease, diabetes, and high blood pressure  High blood pressure causes heart disease and increases the risk of stroke. High blood pressure is more likely to develop in:  People who have blood pressure in the high end of the normal range (130-139/85-89 mm Hg).  People who are overweight or obese.  People who are African American.  If you are 18-39 years of age, have your blood pressure checked every 3-5 years. If you are 40 years of age or older, have your blood pressure checked every year. You should have your blood pressure measured twice-once when you are at a hospital or clinic, and once when you are not at a hospital or clinic. Record the average of the two measurements. To check your blood pressure when you are not at a hospital or clinic, you can use:  An automated blood pressure machine at a pharmacy.  A home blood pressure monitor.  If you  are between 55 years and 79 years old, ask your health care provider if you should take aspirin to prevent strokes.  Have regular diabetes screenings. This involves taking a blood sample to check your fasting blood sugar level.  If you are at a normal weight and have a low risk for diabetes, have this test once every three years after 77 years of age.  If you are overweight and have a high risk for diabetes, consider being tested at a younger age or more often. Preventing infection Hepatitis B  If you have a higher risk for hepatitis B, you should be screened for this virus. You are considered at high risk for hepatitis B if:  You were born in a country where hepatitis B is common. Ask your health care provider which countries are considered high risk.  Your parents were born in a high-risk country, and you have not been immunized against hepatitis B (hepatitis B vaccine).  You have HIV or AIDS.  You use needles to inject street drugs.  You live with someone who has hepatitis B.  You have had sex with someone who has hepatitis B.  You get hemodialysis treatment.  You take certain medicines for conditions, including cancer, organ transplantation, and autoimmune conditions. Hepatitis C  Blood testing is recommended for:  Everyone born from 1945 through 1965.  Anyone with known risk factors for hepatitis C. Sexually transmitted infections (STIs)  You should be screened for sexually transmitted infections (STIs) including gonorrhea and chlamydia if:  You are sexually active and are younger than 77 years of age.  You are older than 77 years of age and your health care provider tells you that you are at risk for this type of infection.  Your sexual activity has changed since you were last screened and you are at an increased risk for chlamydia or gonorrhea. Ask your health care provider if you are at risk.  If you do not have HIV, but are at risk, it may be recommended that you  take a prescription medicine daily to prevent HIV infection. This is called pre-exposure prophylaxis (PrEP). You are considered at risk if:  You are sexually active and do not regularly use condoms or know the HIV status of your partner(s).  You take drugs by injection.  You are sexually active with a partner who has HIV. Talk with your health care provider about whether you are at high risk of being infected with HIV. If you choose to begin PrEP, you should first be tested for HIV. You should then be   tested every 3 months for as long as you are taking PrEP. Pregnancy  If you are premenopausal and you may become pregnant, ask your health care provider about preconception counseling.  If you may become pregnant, take 400 to 800 micrograms (mcg) of folic acid every day.  If you want to prevent pregnancy, talk to your health care provider about birth control (contraception). Osteoporosis and menopause  Osteoporosis is a disease in which the bones lose minerals and strength with aging. This can result in serious bone fractures. Your risk for osteoporosis can be identified using a bone density scan.  If you are 65 years of age or older, or if you are at risk for osteoporosis and fractures, ask your health care provider if you should be screened.  Ask your health care provider whether you should take a calcium or vitamin D supplement to lower your risk for osteoporosis.  Menopause may have certain physical symptoms and risks.  Hormone replacement therapy may reduce some of these symptoms and risks. Talk to your health care provider about whether hormone replacement therapy is right for you. Follow these instructions at home:  Schedule regular health, dental, and eye exams.  Stay current with your immunizations.  Do not use any tobacco products including cigarettes, chewing tobacco, or electronic cigarettes.  If you are pregnant, do not drink alcohol.  If you are breastfeeding, limit  how much and how often you drink alcohol.  Limit alcohol intake to no more than 1 drink per day for nonpregnant women. One drink equals 12 ounces of beer, 5 ounces of wine, or 1 ounces of hard liquor.  Do not use street drugs.  Do not share needles.  Ask your health care provider for help if you need support or information about quitting drugs.  Tell your health care provider if you often feel depressed.  Tell your health care provider if you have ever been abused or do not feel safe at home. This information is not intended to replace advice given to you by your health care provider. Make sure you discuss any questions you have with your health care provider. Document Released: 02/10/2011 Document Revised: 01/03/2016 Document Reviewed: 05/01/2015  2017 [employer]  

## 2016-08-21 NOTE — Progress Notes (Signed)
Pre visit review using our clinic review tool, if applicable. No additional management support is needed unless otherwise documented below in the visit note. 

## 2016-08-28 ENCOUNTER — Ambulatory Visit: Payer: Medicare Other | Admitting: Pulmonary Disease

## 2016-09-11 ENCOUNTER — Ambulatory Visit (INDEPENDENT_AMBULATORY_CARE_PROVIDER_SITE_OTHER): Payer: Medicare Other | Admitting: Adult Health

## 2016-09-11 ENCOUNTER — Encounter: Payer: Self-pay | Admitting: Adult Health

## 2016-09-11 DIAGNOSIS — J449 Chronic obstructive pulmonary disease, unspecified: Secondary | ICD-10-CM

## 2016-09-11 NOTE — Progress Notes (Signed)
@Patient  ID: Patricia [name], female    DOB: [birth year], 76 y.o.   MRN: [id number]  Chief Complaint  Patient presents with  . 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 Activity level is good w/ no change.  Labs chest xray in Oct showed resolved PNA w/ no acute process.     Allergies  Allergen Reactions  . Ace Inhibitors     cough    Immunization History  Administered Date(s) Administered  . Influenza Split 07/24/2011, 04/30/2012  . Influenza Whole 06/14/2007, 06/08/2008, 05/18/2009, 05/09/2010  . Influenza, High Dose Seasonal PF 04/11/2013, 05/13/2016  . Influenza,inj,Quad PF,36+ Mos 05/09/2014, 05/28/2015  . Pneumococcal Conjugate-13 04/06/2014  . Pneumococcal Polysaccharide-23 08/11/2005, 07/18/2010  . Td 01/25/2007    Past Medical History:  Diagnosis Date  . Abnormal LFTs   . Allergic rhinitis   . Anxiety and depression    son commited suicide 2010  . 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 CAD (coronary artery disease)    s/p CABG  . Carotid artery disease ([place])    Moderate - f/u dopplers needed 11/2012  . 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 Zadie Rhine  . COPD (chronic obstructive pulmonary disease) ([place])   . HTN (hypertension)    intolerant to ACEi (cough)  . Hyperlipidemia   . Osteopenia   . PAD (peripheral artery disease) ([place])    Carotid dz as  below, and at [facility] 11/12:  critical stenosis noted just below the  sheath site leading into a totally occluded SFA and a patent deep femoral artery  . Panic attacks   . Thyroid nodule     Tobacco History: History  Smoking Status  . Former Smoker  . Packs/day: 0.50  . Years: 54.00  . Types: Cigarettes  . Quit date: 12/15/2013  Smokeless Tobacco  . Never Used    Comment: quit tobacco 12-2013 after CABG   Counseling given: Not Answered   Outpatient Encounter Prescriptions as of 09/11/2016  Medication Sig  . Aflibercept (EYLEA) 2 MG/0.05ML SOLN Inject 2 mg into the eye every 5 (five) weeks. Dr. Zadie Rhine  . amLODipine (NORVASC) 5 MG tablet Take 1 tablet (5 mg total) by mouth daily.  Marland Kitchen aspirin 81 MG EC tablet Take 1 tablet (81 mg total) by mouth daily.  Marland Kitchen atorvastatin (LIPITOR) 40 MG tablet Take 1 tablet (40 mg total) by mouth at bedtime.  . Cholecalciferol (VITAMIN D3) 1000 UNITS CAPS Take 1,000 Units by mouth daily.  . clonazePAM (KLONOPIN) 0.5 MG tablet Take 0.25-0.5 mg by mouth 2 (two) times daily as needed for anxiety (or sleep).  Marland Kitchen FLUoxetine (PROZAC) 20 MG capsule Take 2 capsules (40 mg total) by mouth daily.  . furosemide (LASIX) 40 MG tablet Take 40 mg by mouth daily as needed for edema.  Marland Kitchen  KLOR-CON M20 20 MEQ tablet Take 1 tablet by mouth daily as needed. Take it along with lasix  . losartan (COZAAR) 100 MG tablet TAKE ONE TABLET BY MOUTH ONCE DAILY  . tiotropium (SPIRIVA HANDIHALER) 18 MCG inhalation capsule Place 1 capsule (18 mcg total) into inhaler and inhale daily. (Patient taking differently: Place 18 mcg into inhaler and inhale as needed. )   Facility-Administered Encounter Medications as of 09/11/2016  Medication  . 0.9 %  sodium chloride infusion  . 0.9 %  sodium chloride infusion     Review of Systems  Constitutional:   No  weight loss, night sweats,  Fevers, chills, fatigue, or  lassitude.  HEENT:   No headaches,  Difficulty swallowing,  Tooth/dental problems, or  Sore throat,                No sneezing, itching, ear ache, nasal  congestion, post nasal drip,   CV:  No chest pain,  Orthopnea, PND, swelling in lower extremities, anasarca, dizziness, palpitations, syncope.   GI  No heartburn, indigestion, abdominal pain, nausea, vomiting, diarrhea, change in bowel habits, loss of appetite, bloody stools.   Resp: No shortness of breath with exertion or at rest.  No excess mucus, no productive cough,  No non-productive cough,  No coughing up of blood.  No change in color of mucus.  No wheezing.  No chest wall deformity  Skin: no rash or lesions.  GU: no dysuria, change in color of urine, no urgency or frequency.  No flank pain, no hematuria   MS:  No joint pain or swelling.  No decreased range of motion.  No back pain.    Physical Exam  BP 134/68 (BP Location: Left Arm, Cuff Size: Normal)   Pulse 82   Ht 5' 2.5" (1.588 m)   Wt 133 lb (60.3 kg)   SpO2 95%   BMI 23.94 kg/m   GEN: A/Ox3; pleasant , NAD, elderly    HEENT:  [place]/AT,  EACs-clear, TMs-wnl, NOSE-clear, THROAT-clear, no lesions, no postnasal drip or exudate noted.   NECK:  Supple w/ fair ROM; no JVD; normal carotid impulses w/o bruits; no thyromegaly or nodules palpated; no lymphadenopathy.    RESP  Clear  P & A; w/o, wheezes/ rales/ or rhonchi. no accessory muscle use, no dullness to percussion  CARD:  RRR, no m/r/g, no peripheral edema, pulses intact, no cyanosis or clubbing.  GI:   Soft & nt; nml bowel sounds; no organomegaly or masses detected.   Musco: Warm bil, no deformities or joint swelling noted.   Neuro: alert, no focal deficits noted.    Skin: Warm, no lesions or rashes  Psych:  No change in mood or affect. No depression or anxiety.  No memory loss.  Lab Results:  CBC   BNP No results found for: BNP  ProBNP  Assessment & Plan:   COPD (chronic obstructive pulmonary disease) Well controlled on Spriva w/o flare   Plan  Patient Instructions  Continue on Spiriva daily  Follow up with Dr. Elsworth Soho  In 1 year and As needed           Rexene Edison, NP 09/11/2016

## 2016-09-11 NOTE — Assessment & Plan Note (Signed)
Well controlled on Spriva w/o flare   Plan  Patient Instructions  Continue on Spiriva daily  Follow up with Dr. Elsworth Soho  In 1 year and As needed

## 2016-09-11 NOTE — Patient Instructions (Signed)
Continue on Spiriva daily  Follow up with Dr. Elsworth Soho  In 1 year and As needed

## 2016-09-12 NOTE — Progress Notes (Signed)
Reviewed & agree with plan  

## 2016-10-07 DIAGNOSIS — M7989 Other specified soft tissue disorders: Secondary | ICD-10-CM | POA: Diagnosis not present

## 2016-10-07 DIAGNOSIS — S6992XA Unspecified injury of left wrist, hand and finger(s), initial encounter: Secondary | ICD-10-CM | POA: Diagnosis not present

## 2016-10-07 DIAGNOSIS — S61511A Laceration without foreign body of right wrist, initial encounter: Secondary | ICD-10-CM | POA: Diagnosis not present

## 2016-10-07 DIAGNOSIS — Z23 Encounter for immunization: Secondary | ICD-10-CM | POA: Diagnosis not present

## 2016-10-29 DIAGNOSIS — H34812 Central retinal vein occlusion, left eye, with macular edema: Secondary | ICD-10-CM | POA: Diagnosis not present

## 2016-11-03 ENCOUNTER — Other Ambulatory Visit: Payer: Self-pay | Admitting: Cardiovascular Disease

## 2016-11-03 ENCOUNTER — Other Ambulatory Visit: Payer: Self-pay | Admitting: Internal Medicine

## 2016-11-03 NOTE — Telephone Encounter (Signed)
Ok 60 and 1 RF 

## 2016-11-03 NOTE — Telephone Encounter (Signed)
Rx faxed to [facility] pharmacy  

## 2016-11-03 NOTE — Telephone Encounter (Signed)
Pt is requesting refill on Clonazepam.  Last OV: 05/13/2016 Last Fill: 07/07/2016 #60 and 1RF UDS: None  Please advise.

## 2016-11-03 NOTE — Telephone Encounter (Signed)
Rx printed, awaiting MD signature.  

## 2016-12-31 ENCOUNTER — Other Ambulatory Visit: Payer: Self-pay | Admitting: Internal Medicine

## 2017-01-14 ENCOUNTER — Ambulatory Visit (INDEPENDENT_AMBULATORY_CARE_PROVIDER_SITE_OTHER): Payer: Medicare Other | Admitting: Internal Medicine

## 2017-01-14 ENCOUNTER — Encounter: Payer: Self-pay | Admitting: Internal Medicine

## 2017-01-14 VITALS — BP 122/68 | HR 84 | Temp 98.1°F | Resp 12 | Ht 62.5 in | Wt 136.4 lb

## 2017-01-14 DIAGNOSIS — H6123 Impacted cerumen, bilateral: Secondary | ICD-10-CM | POA: Diagnosis not present

## 2017-01-14 NOTE — Progress Notes (Signed)
Pre visit review using our clinic review tool, if applicable. No additional management support is needed unless otherwise documented below in the visit note. 

## 2017-01-14 NOTE — Progress Notes (Signed)
Subjective:    Patient ID: Patricia [name], female    DOB: 02/07/1941, 76 y.o.   MRN: [id number]  DOS:  01/14/2017 Type of visit - description : cc Decreased hearing Interval history: Patient has decreased hearing for several weeks, think is related to cerumen impaction. Denies any pain or discharge. Using OTC drops.   Review of Systems   Past Medical History:  Diagnosis Date  . Abnormal LFTs   . Allergic rhinitis   . Anxiety and depression    son commited suicide 2010  . 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 CAD (coronary artery disease)    s/p CABG  . Carotid artery disease ([place])    Moderate - f/u dopplers needed 11/2012  . 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 Zadie Rhine  . COPD (chronic obstructive pulmonary disease) ([place])   . HTN (hypertension)    intolerant to ACEi (cough)  . Hyperlipidemia   . Osteopenia   . PAD (peripheral artery disease) ([place])    Carotid dz as  below, and at [facility] 11/12:  critical stenosis noted just below the sheath site leading into a totally occluded SFA and a patent deep femoral artery  . Panic attacks   . Thyroid nodule     Past Surgical History:  Procedure Laterality Date  . CORONARY ANGIOPLASTY WITH STENT PLACEMENT     "1; makes total of 3" (07/05/2012)  . CORONARY ARTERY BYPASS GRAFT N/A 12/15/2013   Procedure: CORONARY ARTERY BYPASS GRAFTING (CABG);  Surgeon: Gaye Pollack, MD;  Location: [place];  Service: Open Heart Surgery;  Laterality: N/A;  Times 3 using left internal mammary artery and endoscopically harvested right saphenous vein   . INTRAOPERATIVE TRANSESOPHAGEAL ECHOCARDIOGRAM N/A 12/15/2013   Procedure: INTRAOPERATIVE TRANSESOPHAGEAL ECHOCARDIOGRAM;  Surgeon: Gaye Pollack, MD;  Location: [facility] OR;  Service: Open Heart Surgery;  Laterality: N/A;  . 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  LEFT HEART CATHETERIZATION WITH CORONARY ANGIOGRAM N/A 07/02/2011   Procedure: LEFT HEART CATHETERIZATION WITH CORONARY ANGIOGRAM;  Surgeon: Sherren Mocha, MD;  Location[facility] C[facility] LAB;  Service: Cardiovascular;  Laterality: N/A;  . LEFT HEART CATHETERIZATION WITH CORONARY ANGIOGRAM N/A 07/05/2012   Procedure: LEFT HEART CATHETERIZATION WITH CORONARY ANGIOGRAM;  Surgeon: Sherren Mocha, MD;  Location: [facility] CATH LAB;  Service: Cardiovascular;  Laterality: N/A;  . LEFT HEART CATHETERIZATION WITH CORONARY ANGIOGRAM N/A 11/28/2013   Procedure: LEFT HEART CATHETERIZATION WITH CORONARY ANGIOGRAM;  Surgeon: Blane Ohara, MD;  Location: [facility] CATH LAB;  Service: Cardiovascular;  Laterality: N/A;  . PERCUTANEOUS CORONARY INTERVENTION-BALLOON ONLY  07/02/2011   Procedure: PERCUTANEOUS CORONARY INTERVENTION-BALLOON ONLY;  Surgeon: Sherren Mocha, MD;  Location: [facility] CATH LAB;  Service: Cardiovascular;;  . PERCUTANEOUS CORONARY INTERVENTION-BALLOON ONLY  07/05/2012   Procedure: PERCUTANEOUS CORONARY INTERVENTION-BALLOON ONLY;  Surgeon: Sherren Mocha, MD;  Location: [facility] CATH LAB;  Service: Cardiovascular;;  . VAGINAL HYSTERECTOMY  ~ 1976   no oophorectomy    Social History   Social History  . Marital status: Widowed    Spouse name: N/A  . Number of children: 1  . Years of education: N/A   Occupational History  . Retired-2003    Social History Main Topics  . Smoking status: Former Smoker    Packs/day: 0.50    Years: 54.00  Types: Cigarettes    Quit date: 12/15/2013  . Smokeless tobacco: Never Used     Comment: quit tobacco 12-2013 after CABG  . Alcohol use 3.6 oz/week    6 Shots of liquor per week  . Drug use: No  . Sexual activity: No   Other Topics Concern  . Not on file   Social History Narrative   G son lives w/ her    HC-POA -- sister's daughter       Allergies as of 01/14/2017      Reactions   Ace Inhibitors    cough      Medication List       Accurate as of 01/14/17 11:59 PM.  Always use your most recent med list.          amLODipine 5 MG tablet Commonly known as:  NORVASC Take 1 tablet (5 mg total) by mouth daily.   aspirin 81 MG EC tablet Take 1 tablet (81 mg total) by mouth daily.   atorvastatin 40 MG tablet Commonly known as:  LIPITOR Take 1 tablet (40 mg total) by mouth at bedtime.   clonazePAM 0.5 MG tablet Commonly known as:  KLONOPIN Take 1 tablet by mouth daily in the morning as needed for anxiety and 1.5 to 2 tablets by mouth at bedtime as needed for sleep.   EYLEA 2 MG/0.05ML Soln Generic drug:  Aflibercept Inject 2 mg into the eye every 5 (five) weeks. Dr. Zadie Rhine   FLUoxetine 20 MG capsule Commonly known as:  PROZAC Take 2 capsules (40 mg total) by mouth daily.   furosemide 40 MG tablet Commonly known as:  LASIX Take 40 mg by mouth daily as needed for edema.   KLOR-CON M20 20 MEQ tablet Generic drug:  potassium chloride SA Take 1 tablet by mouth daily as needed. Take it along with lasix   losartan 100 MG tablet Commonly known as:  COZAAR TAKE ONE TABLET BY MOUTH ONCE DAILY   naproxen sodium 220 MG tablet Commonly known as:  ANAPROX Take 220 mg by mouth 2 (two) times daily with a meal.   tiotropium 18 MCG inhalation capsule Commonly known as:  SPIRIVA HANDIHALER Place 1 capsule (18 mcg total) into inhaler and inhale daily.   Vitamin D3 1000 units Caps Take 1,000 Units by mouth daily.          Objective:   Physical Exam BP 122/68 (BP Location: Left Arm, Patient Position: Sitting, Cuff Size: Small)   Pulse 84   Temp 98.1 F (36.7 C) (Oral)   Resp 12   Ht 5' 2.5" (1.588 m)   Wt 136 lb 6 oz (61.9 kg)   SpO2 97%   BMI 24.55 kg/m  General:   Well developed, well nourished . NAD.  HEENT:  Normocephalic . Face symmetric, atraumatic. Ears: Both are impacted with abundant wax.  Skin: Not pale. Not jaundice Neurologic:  alert & oriented X3.  Speech normal, gait appropriate for age and unassisted Psych--    Cognition and judgment appear intact.  Cooperative with normal attention span and concentration.  Behavior appropriate. No anxious or depressed appearing.      Assessment & Plan:   Assessment>  Hyperglycemia, A1c 5.7 HTN Hyperlipidemia Anxiety depression COPD , quit tobacco 2015 DJD CV: --CAD,cath 2012, 2013, CABG 2015 --PAD -   ABIs 2012, 07-2015 : Stable moderate disease left, normal right  --Carotid disease - [place] 04-2015, mod dz, fu 1 year --Central Retinal vein occlusion 02-2015 --Palpable aorta US  abdomen 06-2015 no AA   Increase LFTs, elevated alkaline phosphatase , [place] abd neg 06-2015  Thyroid nodule --[place] 2012 stable (since 2009)   PLAN  Cerumen impaction: I remove a large amount of wax with a spoon B, subsequently had a lavage B , ears are now  completely normal ,TMs normal. Patient did not get dizzy. Due for a checkup, RTC within 6 weeks.

## 2017-01-14 NOTE — Patient Instructions (Signed)
Schedule an appointment for a routine checkup within the next 6 weeks

## 2017-01-15 NOTE — Assessment & Plan Note (Signed)
Cerumen impaction: I remove a large amount of wax with a spoon B, subsequently had a lavage B , ears are now  completely normal ,TMs normal. Patient did not get dizzy. Due for a checkup, RTC within 6 weeks.

## 2017-02-10 ENCOUNTER — Other Ambulatory Visit: Payer: Self-pay | Admitting: Internal Medicine

## 2017-02-10 DIAGNOSIS — H40013 Open angle with borderline findings, low risk, bilateral: Secondary | ICD-10-CM | POA: Diagnosis not present

## 2017-02-10 DIAGNOSIS — Z961 Presence of intraocular lens: Secondary | ICD-10-CM | POA: Diagnosis not present

## 2017-02-10 DIAGNOSIS — H348322 Tributary (branch) retinal vein occlusion, left eye, stable: Secondary | ICD-10-CM | POA: Diagnosis not present

## 2017-02-19 ENCOUNTER — Other Ambulatory Visit: Payer: Self-pay | Admitting: Internal Medicine

## 2017-02-19 NOTE — Telephone Encounter (Signed)
Pt is requesting refill on clonazepam 0.5mg .   Last OV: 01/14/2017 Last Fill: 11/03/2016 #60 and 1RF UDS: 08/29/2014 Low risk  [place] Controlled Substance Database printed; no issues noted.   Please advise.

## 2017-02-19 NOTE — Telephone Encounter (Signed)
Rx phoned in to [facility] pharmacy.

## 2017-02-19 NOTE — Telephone Encounter (Signed)
Has an appointment with me later this month. Okay #60, no refills

## 2017-03-03 ENCOUNTER — Ambulatory Visit: Payer: Medicare Other | Admitting: Internal Medicine

## 2017-03-03 DIAGNOSIS — H34812 Central retinal vein occlusion, left eye, with macular edema: Secondary | ICD-10-CM | POA: Diagnosis not present

## 2017-03-03 DIAGNOSIS — H43811 Vitreous degeneration, right eye: Secondary | ICD-10-CM | POA: Diagnosis not present

## 2017-03-03 DIAGNOSIS — H35042 Retinal micro-aneurysms, unspecified, left eye: Secondary | ICD-10-CM | POA: Diagnosis not present

## 2017-03-03 DIAGNOSIS — H35352 Cystoid macular degeneration, left eye: Secondary | ICD-10-CM | POA: Diagnosis not present

## 2017-03-06 ENCOUNTER — Encounter: Payer: Self-pay | Admitting: Internal Medicine

## 2017-03-06 ENCOUNTER — Ambulatory Visit (INDEPENDENT_AMBULATORY_CARE_PROVIDER_SITE_OTHER): Payer: Medicare Other | Admitting: Internal Medicine

## 2017-03-06 VITALS — BP 155/61 | HR 72 | Temp 98.1°F | Ht 63.0 in | Wt 138.0 lb

## 2017-03-06 DIAGNOSIS — I2510 Atherosclerotic heart disease of native coronary artery without angina pectoris: Secondary | ICD-10-CM

## 2017-03-06 DIAGNOSIS — J449 Chronic obstructive pulmonary disease, unspecified: Secondary | ICD-10-CM

## 2017-03-06 DIAGNOSIS — I10 Essential (primary) hypertension: Secondary | ICD-10-CM

## 2017-03-06 DIAGNOSIS — R748 Abnormal levels of other serum enzymes: Secondary | ICD-10-CM | POA: Diagnosis not present

## 2017-03-06 DIAGNOSIS — Z79899 Other long term (current) drug therapy: Secondary | ICD-10-CM | POA: Diagnosis not present

## 2017-03-06 NOTE — Patient Instructions (Signed)
GO TO THE LAB : Get the blood work     GO TO THE FRONT DESK Schedule your next appointment for a  Check up in 4-5 months    Check the  blood pressure  weekly at the pharmacy  Be sure your blood pressure is between 110/65 and  140/85. If it is consistently higher or lower, let me know

## 2017-03-06 NOTE — Progress Notes (Signed)
Subjective:    Patient ID: Patricia [name], female    DOB: 1941-02-18, 76 y.o.   MRN: [id number]  DOS:  03/06/2017 Type of visit - description : rov Interval history: In general feeling well. No major concerns. Good compliance of medication. Notes from GI, cardiology and the colonoscopy report reviewed. BP today slightly elevated, no ambulatory BPs.  BP Readings from Last 3 Encounters:  03/06/17 (!) 155/61  01/14/17 122/68  09/11/16 134/68     Review of Systems  Cough at baseline, dry and mild. Denies chest pain. Vision has deteriorated to some extent.  Past Medical History:  Diagnosis Date  . Abnormal LFTs   . Allergic rhinitis   . Anxiety and depression    son commited suicide 2010  . 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 CAD (coronary artery disease)    s/p CABG  . Carotid artery disease ([place])    Moderate - f/u dopplers needed 11/2012  . 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 Zadie Rhine  . COPD (chronic obstructive pulmonary disease) ([place])   . HTN (hypertension)    intolerant to ACEi (cough)  . Hyperlipidemia   . Osteopenia   . PAD (peripheral artery disease) ([place])    Carotid dz as  below, and at [facility] 11/12:  critical stenosis noted just below the sheath site leading into a totally occluded SFA and a patent deep femoral artery  . Panic attacks   . Thyroid nodule     Past Surgical History:  Procedure Laterality Date  . CORONARY ANGIOPLASTY WITH STENT PLACEMENT     "1; makes total of 3" (07/05/2012)  . CORONARY ARTERY BYPASS GRAFT N/A 12/15/2013   Procedure: CORONARY ARTERY BYPASS GRAFTING (CABG);  Surgeon: Gaye Pollack, MD;  Location: [place];  Service: Open Heart Surgery;  Laterality: N/A;  Times 3 using left internal mammary artery and endoscopically harvested right saphenous vein   . INTRAOPERATIVE TRANSESOPHAGEAL ECHOCARDIOGRAM N/A 12/15/2013   Procedure: INTRAOPERATIVE TRANSESOPHAGEAL ECHOCARDIOGRAM;  Surgeon:  Gaye Pollack, MD;  Location: [facility] OR;  Service: Open Heart Surgery;  Laterality: N/A;  . 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 LEFT HEART CATHETERIZATION WITH CORONARY ANGIOGRAM N/A 07/02/2011   Procedure: LEFT HEART CATHETERIZATION WITH CORONARY ANGIOGRAM;  Surgeon: Sherren Mocha, MD;  Location: [facility] CATH LAB;  Service: Cardiovascular;  Laterality: N/A;  . LEFT HEART CATHETERIZATION WITH CORONARY ANGIOGRAM N/A 07/05/2012   Procedure: LEFT HEART CATHETERIZATION WITH CORONARY ANGIOGRAM;  Surgeon: Sherren Mocha, MD;  Location: [facility] CATH LAB;  Service: Cardiovascular;  Laterality: N/A;  . LEFT HEART CATHETERIZATION WITH CORONARY ANGIOGRAM N/A 11/28/2013   Procedure: LEFT HEART CATHETERIZATION WITH CORONARY ANGIOGRAM;  Surgeon: Blane Ohara, MD;  Location: [facility] CATH LAB;  Service: Cardiovascular;  Laterality: N/A;  . PERCUTANEOUS CORONARY INTERVENTION-BALLOON ONLY  07/02/2011   Procedure: PERCUTANEOUS CORONARY INTERVENTION-BALLOON ONLY;  Surgeon: Sherren Mocha, MD;  Location: [facility] CATH LAB;  Service: Cardiovascular;;  . PERCUTANEOUS CORONARY INTERVENTION-BALLOON ONLY  07/05/2012   Procedure: PERCUTANEOUS CORONARY INTERVENTION-BALLOON ONLY;  Surgeon: Sherren Mocha, MD;  Location: [facility] CATH LAB;  Service: Cardiovascular;;  . VAGINAL HYSTERECTOMY  ~ 1976   no oophorectomy    Social History   Social History  . Marital status: Widowed    Spouse name: N/A  . Number of children: 1  .  Years of education: N/A   Occupational History  . Retired-2003    Social History Main Topics  . Smoking status: Former Smoker    Packs/day: 0.50    Years: 54.00    Types: Cigarettes    Quit date: 12/15/2013  . Smokeless tobacco: Never Used     Comment: quit tobacco 12-2013 after CABG  . Alcohol use 3.6 oz/week    6 Shots of liquor per week  . Drug use: No  . Sexual activity: No   Other Topics Concern  . Not on file    Social History Narrative   G son lives w/ her    HC-POA -- sister's daughter       Allergies as of 03/06/2017      Reactions   Ace Inhibitors    cough      Medication List       Accurate as of 03/06/17 11:59 PM. Always use your most recent med list.          amLODipine 5 MG tablet Commonly known as:  NORVASC Take 1 tablet (5 mg total) by mouth daily.   aspirin 81 MG EC tablet Take 1 tablet (81 mg total) by mouth daily.   atorvastatin 40 MG tablet Commonly known as:  LIPITOR Take 1 tablet (40 mg total) by mouth daily with breakfast.   clonazePAM 0.5 MG tablet Commonly known as:  KLONOPIN TAKE 1 TABLET BY MOUTH IN THE MORNING AS NEEDED FOR ANXIETY AND 1 & 1/2 (ONE & ONE-HALF) TO 2 AT BEDTIME AS NEEDED FOR SLEEP   EYLEA 2 MG/0.05ML Soln Generic drug:  Aflibercept Inject 2 mg into the eye every 5 (five) weeks. Dr. Zadie Rhine   FLUoxetine 20 MG capsule Commonly known as:  PROZAC Take 2 capsules (40 mg total) by mouth daily.   furosemide 40 MG tablet Commonly known as:  LASIX Take 40 mg by mouth daily as needed for edema.   KLOR-CON M20 20 MEQ tablet Generic drug:  potassium chloride SA Take 1 tablet by mouth daily as needed. Take it along with lasix   losartan 100 MG tablet Commonly known as:  COZAAR TAKE ONE TABLET BY MOUTH ONCE DAILY   naproxen sodium 220 MG tablet Commonly known as:  ANAPROX Take 220 mg by mouth 2 (two) times daily with a meal.   tiotropium 18 MCG inhalation capsule Commonly known as:  SPIRIVA HANDIHALER Place 1 capsule (18 mcg total) into inhaler and inhale daily.   Vitamin D3 1000 units Caps Take 1,000 Units by mouth daily.          Objective:   Physical Exam BP (!) 155/61 (BP Location: Left Arm, Patient Position: Sitting, Cuff Size: Small)   Pulse 72   Temp 98.1 F (36.7 C) (Oral)   Ht 5\' 3"  (1.6 m)   Wt 138 lb (62.6 kg)   SpO2 93%   BMI 24.45 kg/m  General:   Well developed, well nourished . NAD.  HEENT:    Normocephalic . Face symmetric, atraumatic Lungs:  Decreased breath sounds but otherwise clear Normal respiratory effort, no intercostal retractions, no accessory muscle use. Heart: RRR,  no murmur.  No pretibial edema bilaterally  Skin: Not pale. Not jaundice Neurologic:  alert & oriented X3.  Speech normal, gait appropriate for age and unassisted Psych--  Cognition and judgment appear intact.  Cooperative with normal attention span and concentration.  Behavior appropriate. No anxious or depressed appearing.      Assessment & Plan:  Assessment   Hyperglycemia, A1c 5.7 HTN Hyperlipidemia Anxiety depression COPD , quit tobacco 2015 DJD CV: --CAD,cath 2012, 2013, CABG 2015 --PAD -   ABIs 2012, 07-2015 : Stable moderate disease left, normal right  --Carotid disease - [place] 04-2015, mod dz, fu 1 year --Central Retinal vein occlusion 02-2015 --Palpable aorta US abdomen 06-2015 no AA   Increase LFTs, elevated alkaline phosphatase , [place] abd neg 06-2015  Thyroid nodule --[place] 2012 stable (since 2009)   PLAN HTN: Good compliance with amlodipine, Lasix, potassium, losartan. Check a CMP. COPD: Last visit with pulmonology 09/11/2016, next in one year. Currently on Spiriva as needed, doing well. Change in bowel habits:  December 2017, saw GI, had a colonoscopy: + diverticuli, polyps. Next colonoscopy per GI CAD: Note from cardiology December 2017 reviewed, stable. Anxiety: On clonazepam. Check a UDS RTC 4-5 months routine checkup.

## 2017-03-06 NOTE — Progress Notes (Signed)
Pre visit review using our clinic review tool, if applicable. No additional management support is needed unless otherwise documented below in the visit note. 

## 2017-03-07 LAB — COMPREHENSIVE METABOLIC PANEL
ALBUMIN: 3.8 g/dL (ref 3.6–5.1)
ALT: 17 U/L (ref 6–29)
AST: 17 U/L (ref 10–35)
Alkaline Phosphatase: 176 U/L — ABNORMAL HIGH (ref 33–130)
BUN: 12 mg/dL (ref 7–25)
CHLORIDE: 103 mmol/L (ref 98–110)
CO2: 25 mmol/L (ref 20–31)
CREATININE: 0.6 mg/dL (ref 0.60–0.93)
Calcium: 9 mg/dL (ref 8.6–10.4)
Glucose, Bld: 73 mg/dL (ref 65–99)
POTASSIUM: 3.9 mmol/L (ref 3.5–5.3)
Sodium: 140 mmol/L (ref 135–146)
TOTAL PROTEIN: 5.9 g/dL — AB (ref 6.1–8.1)
Total Bilirubin: 0.3 mg/dL (ref 0.2–1.2)

## 2017-03-08 NOTE — Assessment & Plan Note (Signed)
HTN: Good compliance with amlodipine, Lasix, potassium, losartan. Check a CMP. COPD: Last visit with pulmonology 09/11/2016, next in one year. Currently on Spiriva as needed, doing well. Change in bowel habits:  December 2017, saw GI, had a colonoscopy: + diverticuli, polyps. Next colonoscopy per GI CAD: Note from cardiology December 2017 reviewed, stable. Anxiety: On clonazepam. Check a UDS RTC 4-5 months routine checkup.

## 2017-03-12 NOTE — Addendum Note (Signed)
Addended byDamita Dunnings D on: 03/12/2017 01:49 PM   Modules accepted: Orders

## 2017-03-17 ENCOUNTER — Other Ambulatory Visit (INDEPENDENT_AMBULATORY_CARE_PROVIDER_SITE_OTHER): Payer: Medicare Other

## 2017-03-17 DIAGNOSIS — R748 Abnormal levels of other serum enzymes: Secondary | ICD-10-CM

## 2017-03-19 LAB — MITOCHONDRIAL ANTIBODIES: Mitochondrial M2 Ab, IgG: <=20 Units (ref <=20.0)

## 2017-03-24 DIAGNOSIS — H348121 Central retinal vein occlusion, left eye, with retinal neovascularization: Secondary | ICD-10-CM | POA: Diagnosis not present

## 2017-03-31 ENCOUNTER — Telehealth: Payer: Self-pay

## 2017-03-31 NOTE — Telephone Encounter (Signed)
UDS: 03/06/2017  Clonazepam: Not detected 7-Aminoclonazepam (metabolite of clonazepam)  Low risk per PCP 03/31/2017

## 2017-04-16 DIAGNOSIS — H35352 Cystoid macular degeneration, left eye: Secondary | ICD-10-CM | POA: Diagnosis not present

## 2017-04-16 DIAGNOSIS — H34812 Central retinal vein occlusion, left eye, with macular edema: Secondary | ICD-10-CM | POA: Diagnosis not present

## 2017-04-16 DIAGNOSIS — H34822 Venous engorgement, left eye: Secondary | ICD-10-CM | POA: Diagnosis not present

## 2017-04-16 DIAGNOSIS — H35042 Retinal micro-aneurysms, unspecified, left eye: Secondary | ICD-10-CM | POA: Diagnosis not present

## 2017-05-07 ENCOUNTER — Other Ambulatory Visit: Payer: Self-pay | Admitting: Internal Medicine

## 2017-05-08 ENCOUNTER — Ambulatory Visit (HOSPITAL_COMMUNITY)
Admission: RE | Admit: 2017-05-08 | Discharge: 2017-05-08 | Disposition: A | Discharge status: Home / Self Care | Payer: Medicare Other | Source: Ambulatory Visit | Attending: Cardiovascular Disease | Admitting: Cardiovascular Disease

## 2017-05-08 DIAGNOSIS — I739 Peripheral vascular disease, unspecified: Secondary | ICD-10-CM | POA: Insufficient documentation

## 2017-05-08 DIAGNOSIS — I6523 Occlusion and stenosis of bilateral carotid arteries: Secondary | ICD-10-CM | POA: Diagnosis not present

## 2017-05-08 NOTE — Telephone Encounter (Signed)
Pt is requesting refill on clonazepam 0.5mg .  Last OV: 03/06/2017 Last Fill: 02/19/2017 #60 and 0RF UDS: 03/06/2017 Low risk  [place] printed 02/24/2017; no issues noted  Please advise.

## 2017-05-08 NOTE — Telephone Encounter (Signed)
Rx printed, awaiting MD signature.  

## 2017-05-08 NOTE — Telephone Encounter (Signed)
Okay #60 and one refill 

## 2017-05-08 NOTE — Telephone Encounter (Signed)
Rx faxed to [facility] pharmacy  

## 2017-05-12 ENCOUNTER — Telehealth: Payer: Self-pay

## 2017-05-12 DIAGNOSIS — I6523 Occlusion and stenosis of bilateral carotid arteries: Secondary | ICD-10-CM

## 2017-05-12 NOTE — Telephone Encounter (Signed)
-----   Message from Sherren Mocha, MD sent at 05/09/2017  3:31 AM EDT ----- Stable carotid disease. 40-59% stenosis. 1 year FU. thx

## 2017-05-12 NOTE — Telephone Encounter (Signed)
Informed patient of results and verbal understanding expressed.  Repeat carotids ordered to be scheduled in 1 year. Patient agrees with treatment plan. 

## 2017-05-28 DIAGNOSIS — H35042 Retinal micro-aneurysms, unspecified, left eye: Secondary | ICD-10-CM | POA: Diagnosis not present

## 2017-05-28 DIAGNOSIS — H35352 Cystoid macular degeneration, left eye: Secondary | ICD-10-CM | POA: Diagnosis not present

## 2017-05-28 DIAGNOSIS — H34812 Central retinal vein occlusion, left eye, with macular edema: Secondary | ICD-10-CM | POA: Diagnosis not present

## 2017-05-28 DIAGNOSIS — H43811 Vitreous degeneration, right eye: Secondary | ICD-10-CM | POA: Diagnosis not present

## 2017-06-04 ENCOUNTER — Ambulatory Visit (HOSPITAL_BASED_OUTPATIENT_CLINIC_OR_DEPARTMENT_OTHER)
Admission: RE | Admit: 2017-06-04 | Discharge: 2017-06-04 | Disposition: A | Discharge status: Home / Self Care | Payer: Medicare Other | Source: Ambulatory Visit | Attending: Internal Medicine | Admitting: Internal Medicine

## 2017-06-04 ENCOUNTER — Encounter: Payer: Self-pay | Admitting: Internal Medicine

## 2017-06-04 ENCOUNTER — Ambulatory Visit (INDEPENDENT_AMBULATORY_CARE_PROVIDER_SITE_OTHER): Payer: Medicare Other | Admitting: Internal Medicine

## 2017-06-04 VITALS — BP 118/68 | HR 93 | Temp 97.7°F | Resp 14 | Ht 63.0 in | Wt 137.5 lb

## 2017-06-04 DIAGNOSIS — J441 Chronic obstructive pulmonary disease with (acute) exacerbation: Secondary | ICD-10-CM

## 2017-06-04 DIAGNOSIS — R05 Cough: Secondary | ICD-10-CM | POA: Diagnosis not present

## 2017-06-04 DIAGNOSIS — Z87891 Personal history of nicotine dependence: Secondary | ICD-10-CM | POA: Diagnosis not present

## 2017-06-04 DIAGNOSIS — I6523 Occlusion and stenosis of bilateral carotid arteries: Secondary | ICD-10-CM

## 2017-06-04 MED ORDER — DOXYCYCLINE HYCLATE 100 MG PO TABS: 100.0000 mg | ORAL_TABLET | Freq: Two times a day (BID) | ORAL | 0 refills | Status: DC

## 2017-06-04 MED ORDER — PREDNISONE 10 MG PO TABS: ORAL_TABLET | ORAL | 0 refills | Status: DC

## 2017-06-04 MED ORDER — BUDESONIDE-FORMOTEROL FUMARATE 160-4.5 MCG/ACT IN AERO: 2.0000 | INHALATION_SPRAY | Freq: Two times a day (BID) | RESPIRATORY_TRACT | 3 refills | Status: DC

## 2017-06-04 NOTE — Patient Instructions (Signed)
  GO TO THE FRONT DESK Schedule your next appointment for a  checkup in 3 weeks   STOP BY THE FIRST FLOOR:  get the XR    Spiriva every day  Also add Symbicort 2 puffs twice a day  Prednisone as prescribed for few days  doxycycline for a week  Mucinex DM twice a day for 10 days then as needed  Call if not gradually improving

## 2017-06-04 NOTE — Progress Notes (Signed)
Subjective:    Patient ID: Patricia [name], female    DOB: [birth year], 76 y.o.   MRN: [id number]  DOS:  06/04/2017 Type of visit - description : acute Interval history: Her main concern is cough. She has a chronic, daily, on and off and random cough for the last 2 months. No symptoms at night, cough is dry but she feels some rattling and chest congestion. Uses Spiriva only "as needed" because it doesn't seem to help.   Review of Systems  Denies fever chills Denies sneezing, postnasal dripping, itchy eyes or nose. She has DOE to less than 1 block, not far from her baseline. No GERD symptoms No weight loss.  Past Medical History:  Diagnosis Date  . Abnormal LFTs   . Allergic rhinitis   . Anxiety and depression    son commited suicide 2010  . 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 CAD (coronary artery disease)    s/p CABG  . Carotid artery disease ([place])    Moderate - f/u dopplers needed 11/2012  . 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 Zadie Rhine  . COPD (chronic obstructive pulmonary disease) ([place])   . HTN (hypertension)    intolerant to ACEi (cough)  . Hyperlipidemia   . Osteopenia   . PAD (peripheral artery disease) ([place])    Carotid dz as  below, and at [facility] 11/12:  critical stenosis noted just below the sheath site leading into a totally occluded SFA and a patent deep femoral artery  . Panic attacks   . Thyroid nodule     Past Surgical History:  Procedure Laterality Date  . CORONARY ANGIOPLASTY WITH STENT PLACEMENT     "1; makes total of 3" (07/05/2012)  . CORONARY ARTERY BYPASS GRAFT N/A 12/15/2013   Procedure: CORONARY ARTERY BYPASS GRAFTING (CABG);  Surgeon: Gaye Pollack, MD;  Location: [place];  Service: Open Heart Surgery;  Laterality: N/A;  Times 3 using left internal mammary artery and endoscopically harvested right saphenous vein   . INTRAOPERATIVE TRANSESOPHAGEAL ECHOCARDIOGRAM N/A 12/15/2013   Procedure:  INTRAOPERATIVE TRANSESOPHAGEAL ECHOCARDIOGRAM;  Surgeon: Gaye Pollack, MD;  Location: [facility] OR;  Service: Open Heart Surgery;  Laterality: N/A;  . 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 LEFT HEART CATHETERIZATION WITH CORONARY ANGIOGRAM N/A 07/02/2011   Procedure: LEFT HEART CATHETERIZATION WITH CORONARY ANGIOGRAM;  Surgeon: Sherren Mocha, MD;  Location: [facility] CATH LAB;  Service: Cardiovascular;  Laterality: N/A;  . LEFT HEART CATHETERIZATION WITH CORONARY ANGIOGRAM N/A 07/05/2012   Procedure: LEFT HEART CATHETERIZATION WITH CORONARY ANGIOGRAM;  Surgeon: Sherren Mocha, MD;  Location: [facility] CATH LAB;  Service: Cardiovascular;  Laterality: N/A;  . LEFT HEART CATHETERIZATION WITH CORONARY ANGIOGRAM N/A 11/28/2013   Procedure: LEFT HEART CATHETERIZATION WITH CORONARY ANGIOGRAM;  Surgeon: Blane Ohara, MD;  Location: [facility] CATH LAB;  Service: Cardiovascular;  Laterality: N/A;  . PERCUTANEOUS CORONARY INTERVENTION-BALLOON ONLY  07/02/2011   Procedure: PERCUTANEOUS CORONARY INTERVENTION-BALLOON ONLY;  Surgeon: Sherren Mocha, MD;  Location: [facility] CATH LAB;  Service: Cardiovascular;;  . PERCUTANEOUS CORONARY INTERVENTION-BALLOON ONLY  07/05/2012   Procedure: PERCUTANEOUS CORONARY INTERVENTION-BALLOON ONLY;  Surgeon: Sherren Mocha, MD;  Location: [facility] CATH LAB;  Service: Cardiovascular;;  . VAGINAL HYSTERECTOMY  ~ 1976   no oophorectomy    Social History   Social History  .  Marital status: Widowed    Spouse name: N/A  . Number of children: 1  . Years of education: N/A   Occupational History  . Retired-2003    Social History Main Topics  . Smoking status: Former Smoker    Packs/day: 0.50    Years: 54.00    Types: Cigarettes    Quit date: 12/15/2013  . Smokeless tobacco: Never Used     Comment: quit tobacco 12-2013 after CABG  . Alcohol use 3.6 oz/week    6 Shots of liquor per week  . Drug use: No  .  Sexual activity: No   Other Topics Concern  . Not on file   Social History Narrative   G son lives w/ her    HC-POA -- sister's daughter       Allergies as of 06/04/2017      Reactions   Ace Inhibitors    cough      Medication List       Accurate as of 06/04/17 11:59 PM. Always use your most recent med list.          amLODipine 5 MG tablet Commonly known as:  NORVASC Take 1 tablet (5 mg total) by mouth daily.   aspirin 81 MG EC tablet Take 1 tablet (81 mg total) by mouth daily.   atorvastatin 40 MG tablet Commonly known as:  LIPITOR Take 1 tablet (40 mg total) by mouth daily with breakfast.   budesonide-formoterol 160-4.5 MCG/ACT inhaler Commonly known as:  SYMBICORT Inhale 2 puffs into the lungs 2 (two) times daily.   clonazePAM 0.5 MG tablet Commonly known as:  KLONOPIN TAKE 1 TABLET BY MOUTH IN THE MORNING AS NEEDED FOR ANXIETY AND 1 & 1/2 (ONE & ONE-HALF) TO 2 ONCE DAILY AT BEDTIME AS NEEDED FOR SLEEP   doxycycline 100 MG tablet Commonly known as:  VIBRA-TABS Take 1 tablet (100 mg total) by mouth 2 (two) times daily.   EYLEA 2 MG/0.05ML Soln Generic drug:  Aflibercept Inject 2 mg into the eye every 5 (five) weeks. Dr. Zadie Rhine   FLUoxetine 20 MG capsule Commonly known as:  PROZAC Take 2 capsules (40 mg total) by mouth daily.   furosemide 40 MG tablet Commonly known as:  LASIX Take 40 mg by mouth daily as needed for edema.   KLOR-CON M20 20 MEQ tablet Generic drug:  potassium chloride SA Take 1 tablet by mouth daily as needed. Take it along with lasix   losartan 100 MG tablet Commonly known as:  COZAAR TAKE ONE TABLET BY MOUTH ONCE DAILY   naproxen sodium 220 MG tablet Commonly known as:  ANAPROX Take 220 mg by mouth 2 (two) times daily with a meal.   predniSONE 10 MG tablet Commonly known as:  DELTASONE 4 tablets x 2 days, 3 tabs x 2 days, 2 tabs x 2 days, 1 tab x 2 days   tiotropium 18 MCG inhalation capsule Commonly known as:  SPIRIVA  HANDIHALER Place 1 capsule (18 mcg total) into inhaler and inhale daily.   Vitamin D3 1000 units Caps Take 1,000 Units by mouth daily.          Objective:   Physical Exam BP 118/68 (BP Location: Left Arm, Patient Position: Sitting, Cuff Size: Small)   Pulse 93   Temp 97.7 F (36.5 C) (Oral)   Resp 14   Ht 5\' 3"  (1.6 m)   Wt 137 lb 8 oz (62.4 kg)   SpO2 93%   BMI 24.36 kg/m  General:   Well developed, well nourished . NAD.  HEENT:  Normocephalic . Face symmetric, atraumatic. TMs normal, throat symmetric, nose not congested Lungs:  Decreased breath sounds, at both bases, they breath sounds are particularly decrease at bases and she has few rhonchi. Slightly increased expiratory time. Normal respiratory effort, no intercostal retractions, no accessory muscle use. Heart: RRR,  no murmur.  No pretibial edema bilaterally  Skin: Not pale. Not jaundice Neurologic:  alert & oriented X3.  Speech normal, gait appropriate for age and unassisted Psych--  Cognition and judgment appear intact.  Cooperative with normal attention span and concentration.  Behavior appropriate. No anxious or depressed appearing.      Assessment & Plan:    Assessment   Hyperglycemia, A1c 5.7 HTN Hyperlipidemia Anxiety depression COPD , quit tobacco 2015 DJD CV: --CAD,cath 2012, 2013, CABG 2015 --PAD -   ABIs 2012, 07-2015 : Stable moderate disease left, normal right  --Carotid disease - [place] 04-2015, mod dz, fu 1 year --Central Retinal vein occlusion 02-2015 --Palpable aorta US abdomen 06-2015 no AA   Increase LFTs, elevated alkaline phosphatase , [place] abd neg 06-2015  Thyroid nodule --[place] 2012 stable (since 2009)   PLAN COPD: Subacute worsening of respiratory sxs, will treat as a "exacerbation" . Plan: Chest x-ray, prednisone,  doxycycline, Spiriva qd not prn, add Symbicort. Recheck in 3 weeks, will need a flu shot today H/o tobacco abuse: Smoke at least a pack a day for more than 50 years. 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

## 2017-06-04 NOTE — Progress Notes (Signed)
Pre visit review using our clinic review tool, if applicable. No additional management support is needed unless otherwise documented below in the visit note. 

## 2017-06-05 NOTE — Assessment & Plan Note (Signed)
COPD: Subacute worsening of respiratory sxs, will treat as a "exacerbation" . Plan: Chest x-ray, prednisone,  doxycycline, Spiriva qd not prn, add Symbicort. Recheck in 3 weeks, will need a flu shot today H/o tobacco abuse: Smoke at least a pack a day for more than 50 years. 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

## 2017-06-11 ENCOUNTER — Other Ambulatory Visit: Payer: Self-pay | Admitting: Internal Medicine

## 2017-06-11 ENCOUNTER — Telehealth: Payer: Self-pay

## 2017-06-11 NOTE — Telephone Encounter (Signed)
PA initiated via Covermymeds; KEY: [id number]. Awaiting determination.

## 2017-06-12 NOTE — Telephone Encounter (Signed)
PA approved effective 08/09/2016 through 08/09/2017. Referral number: [id number].

## 2017-06-24 ENCOUNTER — Encounter: Payer: Self-pay | Admitting: Internal Medicine

## 2017-06-24 ENCOUNTER — Ambulatory Visit (INDEPENDENT_AMBULATORY_CARE_PROVIDER_SITE_OTHER): Payer: Medicare Other | Admitting: Internal Medicine

## 2017-06-24 VITALS — BP 116/68 | HR 84 | Temp 98.1°F | Resp 14 | Ht 63.0 in | Wt 142.1 lb

## 2017-06-24 DIAGNOSIS — F329 Major depressive disorder, single episode, unspecified: Secondary | ICD-10-CM

## 2017-06-24 DIAGNOSIS — I10 Essential (primary) hypertension: Secondary | ICD-10-CM

## 2017-06-24 DIAGNOSIS — E785 Hyperlipidemia, unspecified: Secondary | ICD-10-CM

## 2017-06-24 DIAGNOSIS — F419 Anxiety disorder, unspecified: Secondary | ICD-10-CM | POA: Diagnosis not present

## 2017-06-24 DIAGNOSIS — I6523 Occlusion and stenosis of bilateral carotid arteries: Secondary | ICD-10-CM

## 2017-06-24 DIAGNOSIS — F32A Depression, unspecified: Secondary | ICD-10-CM

## 2017-06-24 DIAGNOSIS — R945 Abnormal results of liver function studies: Secondary | ICD-10-CM | POA: Diagnosis not present

## 2017-06-24 DIAGNOSIS — Z23 Encounter for immunization: Secondary | ICD-10-CM | POA: Diagnosis not present

## 2017-06-24 DIAGNOSIS — R7989 Other specified abnormal findings of blood chemistry: Secondary | ICD-10-CM

## 2017-06-24 DIAGNOSIS — R739 Hyperglycemia, unspecified: Secondary | ICD-10-CM | POA: Diagnosis not present

## 2017-06-24 NOTE — Patient Instructions (Signed)
GO TO THE LAB : Get the blood work     GO TO THE FRONT DESK Schedule your next appointment for a routine checkup in 4-6 months  Consider a Medicare wellness with one  of our nurses  You are taking Spiriva please call your insurance to see future coverage for those medications, Incruse Ellipta Also Symbicort could be replace by  Advair, Brio Newt [facility]ukes   Ask your pharmacist if your losartan has been recalled

## 2017-06-24 NOTE — Progress Notes (Signed)
Pre visit review using our clinic review tool, if applicable. No additional management support is needed unless otherwise documented below in the visit note. 

## 2017-06-24 NOTE — Progress Notes (Signed)
Subjective:    Patient ID: Patricia [name], female    DOB: 1940/12/05, 76 y.o.   MRN: [id number]  DOS:  06/24/2017 Type of visit - description : f/u Interval history: COPD exacerbation: Improved after treatment, cough has decreased, no sputum production.  Shortness of breath back to baseline.  No chest pain. HTN: Good compliance with medication, BP today is very good Discussed lung  cancer screening Anxiety depression: On clonazepam and Prozac, due for a UDS    Review of Systems Denies lower extremity edema No nausea, vomiting, diarrhea. No suicidal ideas.   Past Medical History:  Diagnosis Date  . Abnormal LFTs   . Allergic rhinitis   . Anxiety and depression    son commited suicide 2010  . 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 CAD (coronary artery disease)    s/p CABG  . Carotid artery disease ([place])    Moderate - f/u dopplers needed 11/2012  . 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 Zadie Rhine  . COPD (chronic obstructive pulmonary disease) ([place])   . HTN (hypertension)    intolerant to ACEi (cough)  . Hyperlipidemia   . Osteopenia   . PAD (peripheral artery disease) ([place])    Carotid dz as  below, and at [facility] 11/12:  critical stenosis noted just below the sheath site leading into a totally occluded SFA and a patent deep femoral artery  . Panic attacks   . Thyroid nodule     Past Surgical History:  Procedure Laterality Date  . CORONARY ANGIOPLASTY WITH STENT PLACEMENT     "1; makes total of 3" (07/05/2012)  . CORONARY ARTERY BYPASS GRAFT N/A 12/15/2013   Procedure: CORONARY ARTERY BYPASS GRAFTING (CABG);  Surgeon: Gaye Pollack, MD;  Location: [place];  Service: Open Heart Surgery;  Laterality: N/A;  Times 3 using left internal mammary artery and endoscopically harvested right saphenous vein   . INTRAOPERATIVE TRANSESOPHAGEAL ECHOCARDIOGRAM N/A 12/15/2013   Procedure: INTRAOPERATIVE TRANSESOPHAGEAL ECHOCARDIOGRAM;  Surgeon:  Gaye Pollack, MD;  Location: [facility] OR;  Service: Open Heart Surgery;  Laterality: N/A;  . 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 LEFT HEART CATHETERIZATION WITH CORONARY ANGIOGRAM N/A 07/02/2011   Procedure: LEFT HEART CATHETERIZATION WITH CORONARY ANGIOGRAM;  Surgeon: Sherren Mocha, MD;  Location: [facility] CATH LAB;  Service: Cardiovascular;  Laterality: N/A;  . LEFT HEART CATHETERIZATION WITH CORONARY ANGIOGRAM N/A 07/05/2012   Procedure: LEFT HEART CATHETERIZATION WITH CORONARY ANGIOGRAM;  Surgeon: Sherren Mocha, MD;  Location: [facility] CATH LAB;  Service: Cardiovascular;  Laterality: N/A;  . LEFT HEART CATHETERIZATION WITH CORONARY ANGIOGRAM N/A 11/28/2013   Procedure: LEFT HEART CATHETERIZATION WITH CORONARY ANGIOGRAM;  Surgeon: Blane Ohara, MD;  Location: [facility] CATH LAB;  Service: Cardiovascular;  Laterality: N/A;  . PERCUTANEOUS CORONARY INTERVENTION-BALLOON ONLY  07/02/2011   Procedure: PERCUTANEOUS CORONARY INTERVENTION-BALLOON ONLY;  Surgeon: Sherren Mocha, MD;  Location: [facility] CATH LAB;  Service: Cardiovascular;;  . PERCUTANEOUS CORONARY INTERVENTION-BALLOON ONLY  07/05/2012   Procedure: PERCUTANEOUS CORONARY INTERVENTION-BALLOON ONLY;  Surgeon: Sherren Mocha, MD;  Location: [facility] CATH LAB;  Service: Cardiovascular;;  . VAGINAL HYSTERECTOMY  ~ 1976   no oophorectomy    Social History   Socioeconomic History  . Marital status: Widowed    Spouse name: Not on file  . Number of children: 1  .  Years of education: Not on file  . Highest education level: Not on file  Social Needs  . Financial resource strain: Not on file  . Food insecurity - worry: Not on file  . Food insecurity - inability: Not on file  . Transportation needs - medical: Not on file  . Transportation needs - non-medical: Not on file  Occupational History  . Occupation: Retired-2003  Tobacco Use  . Smoking status: Former Smoker     Packs/day: 0.50    Years: 54.00    Pack years: 27.00    Types: Cigarettes    Last attempt to quit: 12/15/2013    Years since quitting: 3.5  . Smokeless tobacco: Never Used  . Tobacco comment: quit tobacco 12-2013 after CABG  Substance and Sexual Activity  . Alcohol use: Yes    Alcohol/week: 3.6 oz    Types: 6 Shots of liquor per week  . Drug use: No  . Sexual activity: No    Birth control/protection: Surgical  Other Topics Concern  . Not on file  Social History Narrative   G son lives w/ her    HC-POA -- sister's daughter       Allergies as of 06/24/2017      Reactions   Ace Inhibitors    cough      Medication List        Accurate as of 06/24/17 11:59 PM. Always use your most recent med list.          amLODipine 5 MG tablet Commonly known as:  NORVASC Take 1 tablet (5 mg total) by mouth daily.   aspirin 81 MG EC tablet Take 1 tablet (81 mg total) by mouth daily.   atorvastatin 40 MG tablet Commonly known as:  LIPITOR Take 1 tablet (40 mg total) by mouth daily with breakfast.   budesonide-formoterol 160-4.5 MCG/ACT inhaler Commonly known as:  SYMBICORT Inhale 2 puffs into the lungs 2 (two) times daily.   clonazePAM 0.5 MG tablet Commonly known as:  KLONOPIN TAKE 1 TABLET BY MOUTH IN THE MORNING AS NEEDED FOR ANXIETY AND 1 & 1/2 (ONE & ONE-HALF) TO 2 ONCE DAILY AT BEDTIME AS NEEDED FOR SLEEP   EYLEA 2 MG/0.05ML Soln Generic drug:  Aflibercept Inject 2 mg into the eye every 5 (five) weeks. Dr. Zadie Rhine   FLUoxetine 20 MG capsule Commonly known as:  PROZAC Take 2 capsules (40 mg total) by mouth daily.   furosemide 40 MG tablet Commonly known as:  LASIX Take 40 mg by mouth daily as needed for edema.   KLOR-CON M20 20 MEQ tablet Generic drug:  potassium chloride SA Take 1 tablet by mouth daily as needed. Take it along with lasix   losartan 100 MG tablet Commonly known as:  COZAAR TAKE ONE TABLET BY MOUTH ONCE DAILY   naproxen sodium 220 MG  tablet Commonly known as:  ALEVE Take 220 mg by mouth 2 (two) times daily with a meal.   tiotropium 18 MCG inhalation capsule Commonly known as:  SPIRIVA HANDIHALER Place 1 capsule (18 mcg total) into inhaler and inhale daily.   Vitamin D3 1000 units Caps Take 1,000 Units by mouth daily.          Objective:   Physical Exam BP 116/68 (BP Location: Left Arm, Patient Position: Sitting, Cuff Size: Small)   Pulse 84   Temp 98.1 F (36.7 C) (Oral)   Resp 14   Ht 5\' 3"  (1.6 m)   Wt 142 lb 2 oz (  64.5 kg)   SpO2 96%   BMI 25.18 kg/m  General:   Well developed, well nourished . NAD.  HEENT:  Normocephalic . Face symmetric, atraumatic Lungs:  Breath sounds decreased Normal respiratory effort, no intercostal retractions, no accessory muscle use. Heart: RRR,  no murmur.  No pretibial edema bilaterally  Skin: Not pale. Not jaundice Neurologic:  alert & oriented X3.  Speech normal, gait appropriate for age and unassisted Psych--  Cognition and judgment appear intact.  Cooperative with normal attention span and concentration.  Behavior appropriate. No anxious or depressed appearing.      Assessment & Plan:   Assessment   Hyperglycemia, A1c 5.7 HTN Hyperlipidemia Anxiety depression COPD , quit tobacco 2015 DJD CV: --CAD,cath 2012, 2013, CABG 2015 --PAD -   ABIs 2012, 07-2015 : Stable moderate disease left, normal right  --Carotid disease - [place] 04-2015, mod dz, fu 1 year --Central Retinal vein occlusion 02-2015 --Palpable aorta US abdomen 06-2015 no AA   Increase LFTs, elevated alkaline phosphatase , [place] abd neg 06-2015  Thyroid nodule --[place] 2012 stable (since 2009)   PLAN Hypergycemia: Checking labs HTN: Seems well controlled on amlodipine, Lasix, KCl, losartan. Check   CMP, CBC.  Losartan has been recall.  See instructions. COPD: Improved after antibiotics and prednisone.  Chest x-ray no acute.  Recommend to contact her  insurance regards further coverage for her  inhalers.  See AVS. Lung cancer screening: Patient is 70, smoke 50 pack a year, qualify for screening, pros and cons discussed.  Will call when/if ready.  Once she is 44 she will not qualify.  Patient aware. Anxiety depression: Continue Prozac and clonazepam, UDS and contract today alkaline phosphatase: Slightly elevated,  liver US (-) 2016.  Check GGT. Flu shot today RTC 4-6 months

## 2017-06-25 LAB — CBC WITH DIFFERENTIAL/PLATELET
BASOS ABS: 0.1 10*3/uL (ref 0.0–0.1)
Basophils Relative: 1.1 % (ref 0.0–3.0)
EOS ABS: 0.2 10*3/uL (ref 0.0–0.7)
Eosinophils Relative: 2.8 % (ref 0.0–5.0)
HEMATOCRIT: 42.8 % (ref 36.0–46.0)
HEMOGLOBIN: 14 g/dL (ref 12.0–15.0)
LYMPHS PCT: 17.9 % (ref 12.0–46.0)
Lymphs Abs: 1.1 10*3/uL (ref 0.7–4.0)
MCHC: 32.7 g/dL (ref 30.0–36.0)
MCV: 88.9 fl (ref 78.0–100.0)
MONOS PCT: 8.4 % (ref 3.0–12.0)
Monocytes Absolute: 0.5 10*3/uL (ref 0.1–1.0)
NEUTROS ABS: 4.2 10*3/uL (ref 1.4–7.7)
Neutrophils Relative %: 69.8 % (ref 43.0–77.0)
Platelets: 283 10*3/uL (ref 150.0–400.0)
RBC: 4.81 Mil/uL (ref 3.87–5.11)
RDW: 13.5 % (ref 11.5–15.5)
WBC: 6 10*3/uL (ref 4.0–10.5)

## 2017-06-25 LAB — COMPREHENSIVE METABOLIC PANEL
ALT: 17 U/L (ref 0–35)
AST: 18 U/L (ref 0–37)
Albumin: 3.7 g/dL (ref 3.5–5.2)
Alkaline Phosphatase: 173 U/L — ABNORMAL HIGH (ref 39–117)
BUN: 10 mg/dL (ref 6–23)
CO2: 29 meq/L (ref 19–32)
Calcium: 9.4 mg/dL (ref 8.4–10.5)
Chloride: 100 mEq/L (ref 96–112)
Creatinine, Ser: 0.62 mg/dL (ref 0.40–1.20)
GFR: 99.12 mL/min (ref >60.00)
GLUCOSE: 90 mg/dL (ref 70–99)
POTASSIUM: 3.9 meq/L (ref 3.5–5.1)
SODIUM: 140 meq/L (ref 135–145)
Total Bilirubin: 0.5 mg/dL (ref 0.2–1.2)
Total Protein: 6.6 g/dL (ref 6.0–8.3)

## 2017-06-25 LAB — LIPID PANEL
CHOL/HDL RATIO: 3
Cholesterol: 154 mg/dL (ref 0–200)
HDL: 52.8 mg/dL (ref >39.00)
LDL CALC: 82 mg/dL (ref 0–99)
NONHDL: 101.03
Triglycerides: 96 mg/dL (ref 0.0–149.0)
VLDL: 19.2 mg/dL (ref 0.0–40.0)

## 2017-06-25 LAB — HEMOGLOBIN A1C: Hgb A1c MFr Bld: 5.5 % (ref 4.6–6.5)

## 2017-06-25 LAB — GAMMA GT: GGT: 16 U/L (ref 7–51)

## 2017-06-25 NOTE — Assessment & Plan Note (Signed)
Hypergycemia: Checking labs HTN: Seems well controlled on amlodipine, Lasix, KCl, losartan. Check   CMP, CBC.  Losartan has been recall.  See instructions. COPD: Improved after antibiotics and prednisone.  Chest x-ray no acute.  Recommend to contact her  insurance regards further coverage for her inhalers.  See AVS. Lung cancer screening: Patient is 23, smoke 50 pack a year, qualify for screening, pros and cons discussed.  Will call when/if ready.  Once she is 32 she will not qualify.  Patient aware. Anxiety depression: Continue Prozac and clonazepam, UDS and contract today alkaline phosphatase: Slightly elevated,  liver US (-) 2016.  Check GGT. Flu shot today RTC 4-6 months

## 2017-07-07 ENCOUNTER — Ambulatory Visit: Payer: Medicare Other | Admitting: Internal Medicine

## 2017-07-28 ENCOUNTER — Telehealth: Payer: Self-pay

## 2017-07-28 NOTE — Telephone Encounter (Signed)
PA approval already on file.

## 2017-07-28 NOTE — Telephone Encounter (Signed)
PA initiated via Covermymeds; KEY: [id number]. Awaiting determination.

## 2017-08-07 ENCOUNTER — Ambulatory Visit: Payer: Medicare Other | Admitting: Cardiovascular Disease

## 2017-08-18 DIAGNOSIS — H348322 Tributary (branch) retinal vein occlusion, left eye, stable: Secondary | ICD-10-CM | POA: Diagnosis not present

## 2017-08-18 DIAGNOSIS — Z961 Presence of intraocular lens: Secondary | ICD-10-CM | POA: Diagnosis not present

## 2017-08-18 DIAGNOSIS — H40013 Open angle with borderline findings, low risk, bilateral: Secondary | ICD-10-CM | POA: Diagnosis not present

## 2017-08-20 DIAGNOSIS — H34812 Central retinal vein occlusion, left eye, with macular edema: Secondary | ICD-10-CM | POA: Diagnosis not present

## 2017-08-20 DIAGNOSIS — H35352 Cystoid macular degeneration, left eye: Secondary | ICD-10-CM | POA: Diagnosis not present

## 2017-08-20 DIAGNOSIS — H26491 Other secondary cataract, right eye: Secondary | ICD-10-CM | POA: Diagnosis not present

## 2017-08-20 DIAGNOSIS — H35042 Retinal micro-aneurysms, unspecified, left eye: Secondary | ICD-10-CM | POA: Diagnosis not present

## 2017-08-20 NOTE — Progress Notes (Deleted)
Subjective:   Patricia [name] is a 77 y.o. female who presents for Medicare Annual (Subsequent) preventive examination. The Patient was informed that the wellness visit is to identify future health risk and educate and initiate measures that can reduce risk for increased disease through the lifespan.   Describes health as fair, good or great?  Review of Systems:  No ROS.  Medicare Wellness Visit. Additional risk factors are reflected in the social history.   Sleep patterns:   Home Safety/Smoke Alarms: Feels safe in home. Smoke alarms in place.     Female:   Pap- hysterectomy      Mammo-       Dexa scan-        CCS-  Last 07/28/16: No longer doing routine screening due to age.  Objective:     Vitals: There were no vitals taken for this visit.  There is no height or weight on file to calculate BMI.  Advanced Directives 08/21/2016 12/15/2013 12/13/2013 11/28/2013 07/05/2012 07/02/2011 07/02/2011  Does Patient Have a Medical Advance Directive? Yes Patient has advance directive, copy in chart Patient has advance directive, copy in chart Patient does not have advance directive Patient has advance directive, copy in chart Patient has advance directive, copy not in chart Patient has advance directive, copy not in chart  Type of Advance Directive [place];Living will [place];Living will [place];Living will - [place];Living will - -  Does patient want to make changes to medical advance directive? - No change requested No change requested - - - -  Copy of [place] in Chart? No - copy requested - - - - - -  Pre-existing out of facility DNR order (yellow form or pink MOST form) - No No - No - -    Tobacco Social History   Tobacco Use  Smoking Status Former Smoker  . Packs/day: 0.50  . Years: 54.00  . Pack years: 27.00  . Types: Cigarettes  . Last attempt to quit: 12/15/2013  . Years since  quitting: 3.6  Smokeless Tobacco Never Used  Tobacco Comment   quit tobacco 12-2013 after CABG     Counseling given: Not Answered Comment: quit tobacco 12-2013 after CABG   Clinical Intake:                       Past Medical History:  Diagnosis Date  . Abnormal LFTs   . Allergic rhinitis   . Anxiety and depression    son commited suicide 2010  . 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 CAD (coronary artery disease)    s/p CABG  . Carotid artery disease ([place])    Moderate - f/u dopplers needed 11/2012  . 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 Zadie Rhine  . COPD (chronic obstructive pulmonary disease) ([place])   . HTN (hypertension)    intolerant to ACEi (cough)  . Hyperlipidemia   . Osteopenia   . PAD (peripheral artery disease) ([place])    Carotid dz as  below, and at [facility] 11/12:  critical stenosis noted just below the sheath site leading into a totally occluded SFA and a patent deep femoral artery  . Panic attacks   . Thyroid nodule    Past Surgical History:  Procedure Laterality Date  . CORONARY ANGIOPLASTY WITH STENT PLACEMENT     "1;  makes total of 3" (07/05/2012)  . CORONARY ARTERY BYPASS GRAFT N/A 12/15/2013   Procedure: CORONARY ARTERY BYPASS GRAFTING (CABG);  Surgeon: Gaye Pollack, MD;  Location: [place];  Service: Open Heart Surgery;  Laterality: N/A;  Times 3 using left internal mammary artery and endoscopically harvested right saphenous vein   . INTRAOPERATIVE TRANSESOPHAGEAL ECHOCARDIOGRAM N/A 12/15/2013   Procedure: INTRAOPERATIVE TRANSESOPHAGEAL ECHOCARDIOGRAM;  Surgeon: Gaye Pollack, MD;  Location: [facility] OR;  Service: Open Heart Surgery;  Laterality: N/A;  . 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 LEFT HEART CATHETERIZATION WITH CORONARY ANGIOGRAM N/A 07/02/2011   Procedure: LEFT HEART CATHETERIZATION WITH CORONARY  ANGIOGRAM;  Surgeon: Sherren Mocha, MD;  Location: [facility] CATH LAB;  Service: Cardiovascular;  Laterality: N/A;  . LEFT HEART CATHETERIZATION WITH CORONARY ANGIOGRAM N/A 07/05/2012   Procedure: LEFT HEART CATHETERIZATION WITH CORONARY ANGIOGRAM;  Surgeon: Sherren Mocha, MD;  Location: [facility] CATH LAB;  Service: Cardiovascular;  Laterality: N/A;  . LEFT HEART CATHETERIZATION WITH CORONARY ANGIOGRAM N/A 11/28/2013   Procedure: LEFT HEART CATHETERIZATION WITH CORONARY ANGIOGRAM;  Surgeon: Blane Ohara, MD;  Location: [facility] CATH LAB;  Service: Cardiovascular;  Laterality: N/A;  . PERCUTANEOUS CORONARY INTERVENTION-BALLOON ONLY  07/02/2011   Procedure: PERCUTANEOUS CORONARY INTERVENTION-BALLOON ONLY;  Surgeon: Sherren Mocha, MD;  Location: [facility] CATH LAB;  Service: Cardiovascular;;  . PERCUTANEOUS CORONARY INTERVENTION-BALLOON ONLY  07/05/2012   Procedure: PERCUTANEOUS CORONARY INTERVENTION-BALLOON ONLY;  Surgeon: Sherren Mocha, MD;  Location: [facility] CATH LAB;  Service: Cardiovascular;;  . VAGINAL HYSTERECTOMY  ~ 1976   no oophorectomy   Family History  Problem Relation Age of Onset  . Hypertension Mother   . Stroke Mother   . Coronary artery disease Father        CAD in female relative <50  . Heart attack Father   . Heart attack Brother 34  . Heart Problems Sister 57       perminant pacemaker  . Suicidality Son 73       2010  . Healthy Grandchild   . Colon cancer Neg Hx   . Breast cancer Neg Hx   . Stomach cancer Neg Hx   . Rectal cancer Neg Hx   . Esophageal cancer Neg Hx   . Liver cancer Neg Hx    Social History   Socioeconomic History  . Marital status: Widowed    Spouse name: Not on file  . Number of children: 1  . Years of education: Not on file  . Highest education level: Not on file  Social Needs  . Financial resource strain: Not on file  . Food insecurity - worry: Not on file  . Food insecurity - inability: Not on file  . Transportation needs - medical: Not on file  .  Transportation needs - non-medical: Not on file  Occupational History  . Occupation: Retired-2003  Tobacco Use  . Smoking status: Former Smoker    Packs/day: 0.50    Years: 54.00    Pack years: 27.00    Types: Cigarettes    Last attempt to quit: 12/15/2013    Years since quitting: 3.6  . Smokeless tobacco: Never Used  . Tobacco comment: quit tobacco 12-2013 after CABG  Substance and Sexual Activity  . Alcohol use: Yes    Alcohol/week: 3.6 oz    Types: 6 Shots of liquor per week  . Drug use: No  .  Sexual activity: No    Birth control/protection: Surgical  Other Topics Concern  . Not on file  Social History Narrative   G son lives w/ her    HC-POA -- sister's daughter     Outpatient Encounter Medications as of 08/24/2017  Medication Sig  . Aflibercept (EYLEA) 2 MG/0.05ML SOLN Inject 2 mg into the eye every 5 (five) weeks. Dr. Zadie Rhine  . amLODipine (NORVASC) 5 MG tablet Take 1 tablet (5 mg total) by mouth daily.  Marland Kitchen aspirin 81 MG EC tablet Take 1 tablet (81 mg total) by mouth daily.  Marland Kitchen atorvastatin (LIPITOR) 40 MG tablet Take 1 tablet (40 mg total) by mouth daily with breakfast.  . budesonide-formoterol (SYMBICORT) 160-4.5 MCG/ACT inhaler Inhale 2 puffs into the lungs 2 (two) times daily.  . Cholecalciferol (VITAMIN D3) 1000 UNITS CAPS Take 1,000 Units by mouth daily.  . clonazePAM (KLONOPIN) 0.5 MG tablet TAKE 1 TABLET BY MOUTH IN THE MORNING AS NEEDED FOR ANXIETY AND 1 & 1/2 (ONE & ONE-HALF) TO 2 ONCE DAILY AT BEDTIME AS NEEDED FOR SLEEP  . FLUoxetine (PROZAC) 20 MG capsule Take 2 capsules (40 mg total) by mouth daily.  . furosemide (LASIX) 40 MG tablet Take 40 mg by mouth daily as needed for edema.  Marland Kitchen KLOR-CON M20 20 MEQ tablet Take 1 tablet by mouth daily as needed. Take it along with lasix  . losartan (COZAAR) 100 MG tablet TAKE ONE TABLET BY MOUTH ONCE DAILY  . naproxen sodium (ANAPROX) 220 MG tablet Take 220 mg by mouth 2 (two) times daily with a meal.  . tiotropium (SPIRIVA  HANDIHALER) 18 MCG inhalation capsule Place 1 capsule (18 mcg total) into inhaler and inhale daily.   Facility-Administered Encounter Medications as of 08/24/2017  Medication  . 0.9 %  sodium chloride infusion  . 0.9 %  sodium chloride infusion    Activities of Daily Living In your present state of health, do you have any difficulty performing the following activities: 08/21/2016  Hearing? N  Vision? N  Comment wears glasses for reading  Difficulty concentrating or making decisions? N  Walking or climbing stairs? N  Dressing or bathing? N  Doing errands, shopping? N  Preparing Food and eating ? N  Using the Toilet? N  In the past six months, have you accidently leaked urine? N  Do you have problems with loss of bowel control? N  Managing your Medications? N  Managing your Finances? N  Housekeeping or managing your Housekeeping? N  Some recent data might be hidden    Patient Care Team: Colon Branch, MD as PCP - General Rigoberto Noel, MD as Consulting Physician (Pulmonary Disease) Sherren Mocha, MD as Consulting Physician (Cardiology) Zadie Rhine Clent Demark, MD as Consulting Physician (Ophthalmology) Burtis Junes, NP as Nurse Practitioner (Cardiology)    Assessment:   This is a routine wellness examination for Jun. Physical assessment deferred to PCP.  Exercise Activities and Dietary recommendations   Diet (meal preparation, eat out, water intake, caffeinated beverages, dairy products, fruits and vegetables): {Desc; diets:16563} Breakfast: Lunch:  Dinner:      Goals    None      Fall Risk Fall Risk  08/21/2016 05/13/2016 04/10/2016 01/26/2015 01/25/2014  Falls in the past year? No No No No No  Comment - - Emmi Telephone Survey: data to providers prior to load - -    Depression Screen PHQ 2/9 Scores 08/21/2016 05/13/2016 05/28/2015 01/26/2015  PHQ - 2 Score 0 0 0  0     Cognitive Function MMSE - Mini Mental State Exam 08/21/2016  Orientation to time 5  Orientation to  Place 5  Registration 3  Attention/ Calculation 5  Recall 3  Language- name 2 objects 2  Language- repeat 1  Language- follow 3 step command 3  Language- read & follow direction 1  Write a sentence 1  Copy design 1  Total score 30        Immunization History  Administered Date(s) Administered  . Influenza Split 07/24/2011, 04/30/2012  . Influenza Whole 06/14/2007, 06/08/2008, 05/18/2009, 05/09/2010  . Influenza, High Dose Seasonal PF 04/11/2013, 05/13/2016, 06/24/2017  . Influenza,inj,Quad PF,6+ Mos 05/09/2014, 05/28/2015  . Pneumococcal Conjugate-13 04/06/2014  . Pneumococcal Polysaccharide-23 08/11/2005, 07/18/2010  . Td 01/25/2007  . Tdap 10/07/2016    Screening Tests Health Maintenance  Topic Date Due  . TETANUS/TDAP  10/07/2026  . INFLUENZA VACCINE  Completed  . DEXA SCAN  Completed  . PNA vac Low Risk Adult  Completed      Plan:   ***   I have personally reviewed and noted the following in the patient's chart:   . Medical and social history . Use of alcohol, tobacco or illicit drugs  . Current medications and supplements . Functional ability and status . Nutritional status . Physical activity . Advanced directives . List of other physicians . Hospitalizations, surgeries, and ER visits in previous 12 months . Vitals . Screenings to include cognitive, depression, and falls . Referrals and appointments  In addition, I have reviewed and discussed with patient certain preventive protocols, quality metrics, and best practice recommendations. A written personalized care plan for preventive services as well as general preventive health recommendations were provided to patient.     Shela Nevin, [place]  08/20/2017

## 2017-08-24 ENCOUNTER — Ambulatory Visit: Payer: Medicare Other | Admitting: *Deleted

## 2017-08-26 ENCOUNTER — Other Ambulatory Visit: Payer: Self-pay | Admitting: Cardiovascular Disease

## 2017-08-26 DIAGNOSIS — I739 Peripheral vascular disease, unspecified: Secondary | ICD-10-CM

## 2017-08-26 NOTE — Progress Notes (Deleted)
Subjective:   Patricia [name] is a 77 y.o. female who presents for Medicare Annual (Subsequent) preventive examination.  Review of Systems: No ROS.  Medicare Wellness Visit. Additional risk factors are reflected in the social history.   Sleep patterns:  Home Safety/Smoke Alarms: Feels safe in home. Smoke alarms in place.    Female:   Pap- hysterectomy. No longer doing routine screening due to age.      Mammo- pt declines      Dexa scan- pt declines CCS- last 07/28/16: no routine recall due to age  Objective:     Vitals: There were no vitals taken for this visit.  There is no height or weight on file to calculate BMI.  Advanced Directives 08/21/2016 12/15/2013 12/13/2013 11/28/2013 07/05/2012 07/02/2011 07/02/2011  Does Patient Have a Medical Advance Directive? Yes Patient has advance directive, copy in chart Patient has advance directive, copy in chart Patient does not have advance directive Patient has advance directive, copy in chart Patient has advance directive, copy not in chart Patient has advance directive, copy not in chart  Type of Advance Directive [place];Living will [place];Living will [place];Living will - [place];Living will - -  Does patient want to make changes to medical advance directive? - No change requested No change requested - - - -  Copy of [place] in Chart? No - copy requested - - - - - -  Pre-existing out of facility DNR order (yellow form or pink MOST form) - No No - No - -    Tobacco Social History   Tobacco Use  Smoking Status Former Smoker  . Packs/day: 0.50  . Years: 54.00  . Pack years: 27.00  . Types: Cigarettes  . Last attempt to quit: 12/15/2013  . Years since quitting: 3.6  Smokeless Tobacco Never Used  Tobacco Comment   quit tobacco 12-2013 after CABG     Counseling given: Not Answered Comment: quit tobacco 12-2013 after CABG   Clinical  Intake:                       Past Medical History:  Diagnosis Date  . Abnormal LFTs   . Allergic rhinitis   . Anxiety and depression    son commited suicide 2010  . 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 CAD (coronary artery disease)    s/p CABG  . Carotid artery disease ([place])    Moderate - f/u dopplers needed 11/2012  . 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 Zadie Rhine  . COPD (chronic obstructive pulmonary disease) ([place])   . HTN (hypertension)    intolerant to ACEi (cough)  . Hyperlipidemia   . Osteopenia   . PAD (peripheral artery disease) ([place])    Carotid dz as  below, and at [facility] 11/12:  critical stenosis noted just below the sheath site leading into a totally occluded SFA and a patent deep femoral artery  . Panic attacks   . Thyroid nodule    Past Surgical History:  Procedure Laterality Date  . CORONARY ANGIOPLASTY WITH STENT PLACEMENT     "1; makes total of 3" (07/05/2012)  . CORONARY ARTERY BYPASS GRAFT N/A 12/15/2013   Procedure: CORONARY ARTERY BYPASS GRAFTING (CABG);  Surgeon: Gaye Pollack, MD;  Location: [place];  Service: Open Heart Surgery;  Laterality: N/A;  Times 3 using left internal mammary artery and endoscopically harvested right saphenous vein   . INTRAOPERATIVE TRANSESOPHAGEAL ECHOCARDIOGRAM N/A 12/15/2013   Procedure: INTRAOPERATIVE TRANSESOPHAGEAL ECHOCARDIOGRAM;  Surgeon: Gaye Pollack, MD;  Location: [facility] OR;  Service: Open Heart Surgery;  Laterality: N/A;  . 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 LEFT HEART CATHETERIZATION WITH CORONARY ANGIOGRAM N/A 07/02/2011   Procedure: LEFT HEART CATHETERIZATION WITH CORONARY ANGIOGRAM;  Surgeon: Sherren Mocha, MD;  Location: [facility] CATH LAB;  Service: Cardiovascular;  Laterality: N/A;  . LEFT HEART CATHETERIZATION WITH CORONARY ANGIOGRAM N/A 07/05/2012    Procedure: LEFT HEART CATHETERIZATION WITH CORONARY ANGIOGRAM;  Surgeon: Sherren Mocha, MD;  Location: [facility] CATH LAB;  Service: Cardiovascular;  Laterality: N/A;  . LEFT HEART CATHETERIZATION WITH CORONARY ANGIOGRAM N/A 11/28/2013   Procedure: LEFT HEART CATHETERIZATION WITH CORONARY ANGIOGRAM;  Surgeon: Blane Ohara, MD;  Location: [facility] CATH LAB;  Service: Cardiovascular;  Laterality: N/A;  . PERCUTANEOUS CORONARY INTERVENTION-BALLOON ONLY  07/02/2011   Procedure: PERCUTANEOUS CORONARY INTERVENTION-BALLOON ONLY;  Surgeon: Sherren Mocha, MD;  Location: [facility] CATH LAB;  Service: Cardiovascular;;  . PERCUTANEOUS CORONARY INTERVENTION-BALLOON ONLY  07/05/2012   Procedure: PERCUTANEOUS CORONARY INTERVENTION-BALLOON ONLY;  Surgeon: Sherren Mocha, MD;  Location: [facility] CATH LAB;  Service: Cardiovascular;;  . VAGINAL HYSTERECTOMY  ~ 1976   no oophorectomy   Family History  Problem Relation Age of Onset  . Hypertension Mother   . Stroke Mother   . Coronary artery disease Father        CAD in female relative <50  . Heart attack Father   . Heart attack Brother 66  . Heart Problems Sister 74       perminant pacemaker  . Suicidality Son 91       2010  . Healthy Grandchild   . Colon cancer Neg Hx   . Breast cancer Neg Hx   . Stomach cancer Neg Hx   . Rectal cancer Neg Hx   . Esophageal cancer Neg Hx   . Liver cancer Neg Hx    Social History   Socioeconomic History  . Marital status: Widowed    Spouse name: Not on file  . Number of children: 1  . Years of education: Not on file  . Highest education level: Not on file  Social Needs  . Financial resource strain: Not on file  . Food insecurity - worry: Not on file  . Food insecurity - inability: Not on file  . Transportation needs - medical: Not on file  . Transportation needs - non-medical: Not on file  Occupational History  . Occupation: Retired-2003  Tobacco Use  . Smoking status: Former Smoker    Packs/day: 0.50    Years: 54.00    Pack  years: 27.00    Types: Cigarettes    Last attempt to quit: 12/15/2013    Years since quitting: 3.6  . Smokeless tobacco: Never Used  . Tobacco comment: quit tobacco 12-2013 after CABG  Substance and Sexual Activity  . Alcohol use: Yes    Alcohol/week: 3.6 oz    Types: 6 Shots of liquor per week  . Drug use: No  . Sexual activity: No    Birth control/protection: Surgical  Other Topics Concern  . Not on file  Social History Narrative   G son lives w/ her    HC-POA -- sister's daughter  Outpatient Encounter Medications as of 08/31/2017  Medication Sig  . Aflibercept (EYLEA) 2 MG/0.05ML SOLN Inject 2 mg into the eye every 5 (five) weeks. Dr. Zadie Rhine  . amLODipine (NORVASC) 5 MG tablet Take 1 tablet (5 mg total) by mouth daily.  Marland Kitchen aspirin 81 MG EC tablet Take 1 tablet (81 mg total) by mouth daily.  Marland Kitchen atorvastatin (LIPITOR) 40 MG tablet Take 1 tablet (40 mg total) by mouth daily with breakfast.  . budesonide-formoterol (SYMBICORT) 160-4.5 MCG/ACT inhaler Inhale 2 puffs into the lungs 2 (two) times daily.  . Cholecalciferol (VITAMIN D3) 1000 UNITS CAPS Take 1,000 Units by mouth daily.  . clonazePAM (KLONOPIN) 0.5 MG tablet TAKE 1 TABLET BY MOUTH IN THE MORNING AS NEEDED FOR ANXIETY AND 1 & 1/2 (ONE & ONE-HALF) TO 2 ONCE DAILY AT BEDTIME AS NEEDED FOR SLEEP  . FLUoxetine (PROZAC) 20 MG capsule Take 2 capsules (40 mg total) by mouth daily.  . furosemide (LASIX) 40 MG tablet Take 40 mg by mouth daily as needed for edema.  Marland Kitchen KLOR-CON M20 20 MEQ tablet Take 1 tablet by mouth daily as needed. Take it along with lasix  . losartan (COZAAR) 100 MG tablet TAKE ONE TABLET BY MOUTH ONCE DAILY  . naproxen sodium (ANAPROX) 220 MG tablet Take 220 mg by mouth 2 (two) times daily with a meal.  . tiotropium (SPIRIVA HANDIHALER) 18 MCG inhalation capsule Place 1 capsule (18 mcg total) into inhaler and inhale daily.   Facility-Administered Encounter Medications as of 08/31/2017  Medication  . 0.9 %  sodium  chloride infusion  . 0.9 %  sodium chloride infusion    Activities of Daily Living No flowsheet data found.  Patient Care Team: Colon Branch, MD as PCP - General Rigoberto Noel, MD as Consulting Physician (Pulmonary Disease) Sherren Mocha, MD as Consulting Physician (Cardiology) Zadie Rhine Clent Demark, MD as Consulting Physician (Ophthalmology) Burtis Junes, NP as Nurse Practitioner (Cardiology)    Assessment:   This is a routine wellness examination for [place]. Physical assessment deferred to PCP.  Exercise Activities and Dietary recommendations   Diet (meal preparation, eat out, water intake, caffeinated beverages, dairy products, fruits and vegetables): {Desc; diets:16563} Breakfast: Lunch:  Dinner:      Goals    None      Fall Risk Fall Risk  08/21/2016 05/13/2016 04/10/2016 01/26/2015 01/25/2014  Falls in the past year? No No No No No  Comment - - Emmi Telephone Survey: data to providers prior to load - -   Depression Screen PHQ 2/9 Scores 08/21/2016 05/13/2016 05/28/2015 01/26/2015  PHQ - 2 Score 0 0 0 0     Cognitive Function MMSE - Mini Mental State Exam 08/21/2016  Orientation to time 5  Orientation to Place 5  Registration 3  Attention/ Calculation 5  Recall 3  Language- name 2 objects 2  Language- repeat 1  Language- follow 3 step command 3  Language- read & follow direction 1  Write a sentence 1  Copy design 1  Total score 30        Immunization History  Administered Date(s) Administered  . Influenza Split 07/24/2011, 04/30/2012  . Influenza Whole 06/14/2007, 06/08/2008, 05/18/2009, 05/09/2010  . Influenza, High Dose Seasonal PF 04/11/2013, 05/13/2016, 06/24/2017  . Influenza,inj,Quad PF,6+ Mos 05/09/2014, 05/28/2015  . Pneumococcal Conjugate-13 04/06/2014  . Pneumococcal Polysaccharide-23 08/11/2005, 07/18/2010  . Td 01/25/2007  . Tdap 10/07/2016    Qualifies for Shingles Vaccine?***  Screening Tests Health Maintenance  Topic Date  Due  .  TETANUS/TDAP  10/07/2026  . INFLUENZA VACCINE  Completed  . DEXA SCAN  Completed  . PNA vac Low Risk Adult  Completed      Plan:   ***   I have personally reviewed and noted the following in the patient's chart:   . Medical and social history . Use of alcohol, tobacco or illicit drugs  . Current medications and supplements . Functional ability and status . Nutritional status . Physical activity . Advanced directives . List of other physicians . Hospitalizations, surgeries, and ER visits in previous 12 months . Vitals . Screenings to include cognitive, depression, and falls . Referrals and appointments  In addition, I have reviewed and discussed with patient certain preventive protocols, quality metrics, and best practice recommendations. A written personalized care plan for preventive services as well as general preventive health recommendations were provided to patient.     Shela Nevin, [place]  08/26/2017

## 2017-08-28 ENCOUNTER — Ambulatory Visit (HOSPITAL_COMMUNITY)
Admission: RE | Admit: 2017-08-28 | Discharge: 2017-08-28 | Disposition: A | Discharge status: Home / Self Care | Payer: Medicare Other | Source: Ambulatory Visit | Attending: Cardiovascular Disease | Admitting: Cardiovascular Disease

## 2017-08-28 DIAGNOSIS — R9439 Abnormal result of other cardiovascular function study: Secondary | ICD-10-CM | POA: Insufficient documentation

## 2017-08-28 DIAGNOSIS — I739 Peripheral vascular disease, unspecified: Secondary | ICD-10-CM | POA: Insufficient documentation

## 2017-08-31 ENCOUNTER — Ambulatory Visit: Payer: Medicare Other | Admitting: *Deleted

## 2017-09-03 NOTE — Progress Notes (Addendum)
Subjective:   Patricia [name] is a 77 y.o. female who presents for Medicare Annual (Subsequent) preventive examination. The Patient was informed that the wellness visit is to identify future health risk and educate and initiate measures that can reduce risk for increased disease through the lifespan.   Describes health as fair, good or great? Good.   Review of Systems:  No ROS.  Medicare Wellness Visit. Additional risk factors are reflected in the social history. Cardiac Risk Factors include: advanced age (>3men, >48 women);hypertension;sedentary lifestyle;dyslipidemia Sleep patterns: Takes Klonopin at bedtime. Sleeps well. Denies any issues.  Home Safety/Smoke Alarms: Feels safe in home. Smoke alarms in place. Lives in 1 story home. Lives with grandson and his girlfriend .   Female:   Pap- Hysterectomy. No longer doing routine screening due to age.    Mammo-  Pt declines      Dexa scan-  Pt reports she had scan last year and will try to find report.    CCS- last 07/28/16. No longer doing routine screening due to age.    Objective:     Vitals: BP 138/64 (BP Location: Left Arm, Patient Position: Sitting, Cuff Size: Normal)   Pulse 81   Wt 142 lb (64.4 kg)   SpO2 96%   BMI 25.15 kg/m   Body mass index is 25.15 kg/m.  Advanced Directives 09/04/2017 08/21/2016 12/15/2013 12/13/2013 11/28/2013 07/05/2012 07/02/2011  Does Patient Have a Medical Advance Directive? Yes Yes Patient has advance directive, copy in chart Patient has advance directive, copy in chart Patient does not have advance directive Patient has advance directive, copy in chart Patient has advance directive, copy not in chart  Type of Advance Directive [place];Living will [place];Living will [place];Living will [place];Living will - [place];Living will -  Does patient want to make changes to medical advance directive? - -  No change requested No change requested - - -  Copy of [place] in Chart? Yes No - copy requested - - - - -  Pre-existing out of facility DNR order (yellow form or pink MOST form) - - No No - No -    Tobacco Social History   Tobacco Use  Smoking Status Former Smoker  . Packs/day: 0.50  . Years: 54.00  . Pack years: 27.00  . Types: Cigarettes  . Last attempt to quit: 12/15/2013  . Years since quitting: 3.7  Smokeless Tobacco Never Used  Tobacco Comment   quit tobacco 12-2013 after CABG     Counseling given: Not Answered Comment: quit tobacco 12-2013 after CABG   Clinical Intake: Pain : No/denies pain    Past Medical History:  Diagnosis Date  . Abnormal LFTs   . Allergic rhinitis   . Anxiety and depression    son commited suicide 2010  . 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 CAD (coronary artery disease)    s/p CABG  . Carotid artery disease ([place])    Moderate - f/u dopplers needed 11/2012  . 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 Zadie Rhine  . COPD (chronic obstructive pulmonary disease) ([place])   . HTN (hypertension)    intolerant to ACEi (cough)  . Hyperlipidemia   . Osteopenia   . PAD (peripheral artery disease) ([place])    Carotid dz as  below, and at [facility] 11/12:  critical stenosis  noted just below the sheath site leading into a totally occluded SFA and a patent deep femoral artery  . Panic attacks   . Thyroid nodule    Past Surgical History:  Procedure Laterality Date  . CORONARY ANGIOPLASTY WITH STENT PLACEMENT     "1; makes total of 3" (07/05/2012)  . CORONARY ARTERY BYPASS GRAFT N/A 12/15/2013   Procedure: CORONARY ARTERY BYPASS GRAFTING (CABG);  Surgeon: Gaye Pollack, MD;  Location: [place];  Service: Open Heart Surgery;  Laterality: N/A;  Times 3 using left internal mammary artery and endoscopically harvested right saphenous vein   . INTRAOPERATIVE TRANSESOPHAGEAL ECHOCARDIOGRAM N/A 12/15/2013    Procedure: INTRAOPERATIVE TRANSESOPHAGEAL ECHOCARDIOGRAM;  Surgeon: Gaye Pollack, MD;  Location: [facility] OR;  Service: Open Heart Surgery;  Laterality: N/A;  . 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 LEFT HEART CATHETERIZATION WITH CORONARY ANGIOGRAM N/A 07/02/2011   Procedure: LEFT HEART CATHETERIZATION WITH CORONARY ANGIOGRAM;  Surgeon: Sherren Mocha, MD;  Location: [facility] CATH LAB;  Service: Cardiovascular;  Laterality: N/A;  . LEFT HEART CATHETERIZATION WITH CORONARY ANGIOGRAM N/A 07/05/2012   Procedure: LEFT HEART CATHETERIZATION WITH CORONARY ANGIOGRAM;  Surgeon: Sherren Mocha, MD;  Location: [facility] CATH LAB;  Service: Cardiovascular;  Laterality: N/A;  . LEFT HEART CATHETERIZATION WITH CORONARY ANGIOGRAM N/A 11/28/2013   Procedure: LEFT HEART CATHETERIZATION WITH CORONARY ANGIOGRAM;  Surgeon: Blane Ohara, MD;  Location: [facility] CATH LAB;  Service: Cardiovascular;  Laterality: N/A;  . PERCUTANEOUS CORONARY INTERVENTION-BALLOON ONLY  07/02/2011   Procedure: PERCUTANEOUS CORONARY INTERVENTION-BALLOON ONLY;  Surgeon: Sherren Mocha, MD;  Location: [facility] CATH LAB;  Service: Cardiovascular;;  . PERCUTANEOUS CORONARY INTERVENTION-BALLOON ONLY  07/05/2012   Procedure: PERCUTANEOUS CORONARY INTERVENTION-BALLOON ONLY;  Surgeon: Sherren Mocha, MD;  Location: [facility] CATH LAB;  Service: Cardiovascular;;  . VAGINAL HYSTERECTOMY  ~ 1976   no oophorectomy   Family History  Problem Relation Age of Onset  . Hypertension Mother   . Stroke Mother   . Coronary artery disease Father        CAD in female relative <50  . Heart attack Father   . Heart attack Brother 34  . Heart Problems Sister 61       perminant pacemaker  . Suicidality Son 55       2010  . Healthy Grandchild   . Colon cancer Neg Hx   . Breast cancer Neg Hx   . Stomach cancer Neg Hx   . Rectal cancer Neg Hx   . Esophageal cancer Neg Hx   . Liver cancer Neg  Hx    Social History   Socioeconomic History  . Marital status: Widowed    Spouse name: None  . Number of children: 1  . Years of education: None  . Highest education level: None  Social Needs  . Financial resource strain: None  . Food insecurity - worry: None  . Food insecurity - inability: None  . Transportation needs - medical: None  . Transportation needs - non-medical: None  Occupational History  . Occupation: Retired-2003  Tobacco Use  . Smoking status: Former Smoker    Packs/day: 0.50    Years: 54.00    Pack years: 27.00    Types: Cigarettes    Last attempt to quit: 12/15/2013    Years since quitting: 3.7  . Smokeless tobacco: Never Used  . Tobacco comment: quit tobacco 12-2013 after CABG  Substance and Sexual Activity  . Alcohol use: Yes  . Drug use: No  . Sexual activity: No    Birth control/protection: Surgical  Other Topics Concern  . None  Social History Narrative   G son lives w/ her    [place] -- sister's daughter     Outpatient Encounter Medications as of 09/04/2017  Medication Sig  . Aflibercept (EYLEA) 2 MG/0.05ML SOLN Inject 2 mg into the eye every 5 (five) weeks. Dr. Zadie Rhine  . amLODipine (NORVASC) 5 MG tablet Take 1 tablet (5 mg total) by mouth daily.  Marland Kitchen aspirin 81 MG EC tablet Take 1 tablet (81 mg total) by mouth daily.  Marland Kitchen atorvastatin (LIPITOR) 40 MG tablet Take 1 tablet (40 mg total) by mouth daily with breakfast.  . budesonide-formoterol (SYMBICORT) 160-4.5 MCG/ACT inhaler Inhale 2 puffs into the lungs 2 (two) times daily.  . Cholecalciferol (VITAMIN D3) 1000 UNITS CAPS Take 1,000 Units by mouth daily.  . clonazePAM (KLONOPIN) 0.5 MG tablet TAKE 1 TABLET BY MOUTH IN THE MORNING AS NEEDED FOR ANXIETY AND 1 & 1/2 (ONE & ONE-HALF) TO 2 ONCE DAILY AT BEDTIME AS NEEDED FOR SLEEP  . FLUoxetine (PROZAC) 20 MG capsule Take 2 capsules (40 mg total) by mouth daily.  . furosemide (LASIX) 40 MG tablet Take 40 mg by mouth daily as needed for edema.  Marland Kitchen KLOR-CON  M20 20 MEQ tablet Take 1 tablet by mouth daily as needed. Take it along with lasix  . losartan (COZAAR) 100 MG tablet TAKE ONE TABLET BY MOUTH ONCE DAILY  . naproxen sodium (ANAPROX) 220 MG tablet Take 220 mg by mouth 2 (two) times daily with a meal.  . tiotropium (SPIRIVA HANDIHALER) 18 MCG inhalation capsule Place 1 capsule (18 mcg total) into inhaler and inhale daily.   Facility-Administered Encounter Medications as of 09/04/2017  Medication  . 0.9 %  sodium chloride infusion  . 0.9 %  sodium chloride infusion    Activities of Daily Living In your present state of health, do you have any difficulty performing the following activities: 09/04/2017  Hearing? N  Vision? N  Comment Dr.Groat every 6 months. Wearing glasses.   Difficulty concentrating or making decisions? N  Walking or climbing stairs? N  Dressing or bathing? N  Doing errands, shopping? N  Preparing Food and eating ? N  Using the Toilet? N  In the past six months, have you accidently leaked urine? N  Comment wears pads  Do you have problems with loss of bowel control? N  Managing your Medications? N  Managing your Finances? N  Housekeeping or managing your Housekeeping? N  Some recent data might be hidden    Patient Care Team: Colon Branch, MD as PCP - General Rigoberto Noel, MD as Consulting Physician (Pulmonary Disease) Sherren Mocha, MD as Consulting Physician (Cardiology) Zadie Rhine Clent Demark, MD as Consulting Physician (Ophthalmology) Burtis Junes, NP as Nurse Practitioner (Cardiology)    Assessment:   This is a routine wellness examination for Patricia [name]. Physical assessment deferred to PCP.  Exercise Activities and Dietary recommendations Current Exercise Habits: The patient does not participate in regular exercise at present, Exercise limited by: None identified Diet (meal preparation, eat out, water intake, caffeinated beverages, dairy products, fruits and vegetables): Pt states she just eats whatever she  wants. Not a very healthy diet.       Goals    . Maintain current health (pt-stated)       Fall Risk Fall Risk  09/04/2017 08/21/2016 05/13/2016 04/10/2016 01/26/2015  Falls in the past year? No No No No No  Comment - - - Emmi Telephone Survey: data to providers prior to load -    Depression Screen PHQ 2/9 Scores 09/04/2017 08/21/2016 05/13/2016 05/28/2015  PHQ - 2 Score 0 0 0 0     Cognitive Function MMSE - Mini Mental State Exam 09/04/2017 08/21/2016  Orientation to time 5 5  Orientation to Place 5 5  Registration 3 3  Attention/ Calculation 5 5  Recall 3 3  Language- name 2 objects 2 2  Language- repeat 1 1  Language- follow 3 step command 3 3  Language- read & follow direction 1 1  Write a sentence 1 1  Copy design 1 1  Total score 30 30        Immunization History  Administered Date(s) Administered  . Influenza Split 07/24/2011, 04/30/2012  . Influenza Whole 06/14/2007, 06/08/2008, 05/18/2009, 05/09/2010  . Influenza, High Dose Seasonal PF 04/11/2013, 05/13/2016, 06/24/2017  . Influenza,inj,Quad PF,6+ Mos 05/09/2014, 05/28/2015  . Pneumococcal Conjugate-13 04/06/2014  . Pneumococcal Polysaccharide-23 08/11/2005, 07/18/2010  . Td 01/25/2007  . Tdap 10/07/2016   Screening Tests Health Maintenance  Topic Date Due  . TETANUS/TDAP  10/07/2026  . INFLUENZA VACCINE  Completed  . DEXA SCAN  Completed  . PNA vac Low Risk Adult  Completed      Plan:   Follow up with Dr.Paz as directed  Continue to eat heart healthy diet (full of fruits, vegetables, whole grains, lean protein, water--limit salt, fat, and sugar intake) and increase physical activity as tolerated.  Continue doing brain stimulating activities (puzzles, reading, adult coloring books, staying active) to keep memory sharp.    I have personally reviewed and noted the following in the patient's chart:   . Medical and social history . Use of alcohol, tobacco or illicit drugs  . Current medications and  supplements . Functional ability and status . Nutritional status . Physical activity . Advanced directives . List of other physicians . Hospitalizations, surgeries, and ER visits in previous 12 months . Vitals . Screenings to include cognitive, depression, and falls . Referrals and appointments  In addition, I have reviewed and discussed with patient certain preventive protocols, quality metrics, and best practice recommendations. A written personalized care plan for preventive services as well as general preventive health recommendations were provided to patient.     Naaman Plummer [place], [place]  09/04/2017  Kathlene November, MD

## 2017-09-04 ENCOUNTER — Ambulatory Visit (INDEPENDENT_AMBULATORY_CARE_PROVIDER_SITE_OTHER): Payer: Medicare Other | Admitting: *Deleted

## 2017-09-04 ENCOUNTER — Encounter: Payer: Self-pay | Admitting: *Deleted

## 2017-09-04 VITALS — BP 138/64 | HR 81 | Wt 142.0 lb

## 2017-09-04 DIAGNOSIS — Z0000 Encounter for general adult medical examination without abnormal findings: Secondary | ICD-10-CM | POA: Diagnosis not present

## 2017-09-04 NOTE — Patient Instructions (Signed)
Continue to eat heart healthy diet (full of fruits, vegetables, whole grains, lean protein, water--limit salt, fat, and sugar intake) and increase physical activity as tolerated.  Continue doing brain stimulating activities (puzzles, reading, adult coloring books, staying active) to keep memory sharp.    Ms. Needs , Thank you for taking time to come for your Medicare Wellness Visit. I appreciate your ongoing commitment to your health goals. Please review the following plan we discussed and let me know if I can assist you in the future.   These are the goals we discussed: Goals    . Maintain current health (pt-stated)       This is a list of the screening recommended for you and due dates:  Health Maintenance  Topic Date Due  . Tetanus Vaccine  10/07/2026  . Flu Shot  Completed  . DEXA scan (bone density measurement)  Completed  . Pneumonia vaccines  Completed    Health Maintenance for Postmenopausal Women Menopause is a normal process in which your reproductive ability comes to an end. This process happens gradually over a span of months to years, usually between the ages of 17 and 42. Menopause is complete when you have missed 12 consecutive menstrual periods. It is important to talk with your health care provider about some of the most common conditions that affect postmenopausal women, such as heart disease, cancer, and bone loss (osteoporosis). Adopting a healthy lifestyle and getting preventive care can help to promote your health and wellness. Those actions can also lower your chances of developing some of these common conditions. What should I know about menopause? During menopause, you may experience a number of symptoms, such as:  Moderate-to-severe hot flashes.  Night sweats.  Decrease in sex drive.  Mood swings.  Headaches.  Tiredness.  Irritability.  Memory problems.  Insomnia.  Choosing to treat or not to treat menopausal changes is an individual decision  that you make with your health care provider. What should I know about hormone replacement therapy and supplements? Hormone therapy products are effective for treating symptoms that are associated with menopause, such as hot flashes and night sweats. Hormone replacement carries certain risks, especially as you become older. If you are thinking about using estrogen or estrogen with progestin treatments, discuss the benefits and risks with your health care provider. What should I know about heart disease and stroke? Heart disease, heart attack, and stroke become more likely as you age. This may be due, in part, to the hormonal changes that your body experiences during menopause. These can affect how your body processes dietary fats, triglycerides, and cholesterol. Heart attack and stroke are both medical emergencies. There are many things that you can do to help prevent heart disease and stroke:  Have your blood pressure checked at least every 1-2 years. High blood pressure causes heart disease and increases the risk of stroke.  If you are 64-22 years old, ask your health care provider if you should take aspirin to prevent a heart attack or a stroke.  Do not use any tobacco products, including cigarettes, chewing tobacco, or electronic cigarettes. If you need help quitting, ask your health care provider.  It is important to eat a healthy diet and maintain a healthy weight. ? Be sure to include plenty of vegetables, fruits, low-fat dairy products, and lean protein. ? Avoid eating foods that are high in solid fats, added sugars, or salt (sodium).  Get regular exercise. This is one of the most important things that  you can do for your health. ? Try to exercise for at least 150 minutes each week. The type of exercise that you do should increase your heart rate and make you sweat. This is known as moderate-intensity exercise. ? Try to do strengthening exercises at least twice each week. Do these in  addition to the moderate-intensity exercise.  Know your numbers.Ask your health care provider to check your cholesterol and your blood glucose. Continue to have your blood tested as directed by your health care provider.  What should I know about cancer screening? There are several types of cancer. Take the following steps to reduce your risk and to catch any cancer development as early as possible. Breast Cancer  Practice breast self-awareness. ? This means understanding how your breasts normally appear and feel. ? It also means doing regular breast self-exams. Let your health care provider know about any changes, no matter how small.  If you are 66 or older, have a clinician do a breast exam (clinical breast exam or CBE) every year. Depending on your age, family history, and medical history, it may be recommended that you also have a yearly breast X-ray (mammogram).  If you have a family history of breast cancer, talk with your health care provider about genetic screening.  If you are at high risk for breast cancer, talk with your health care provider about having an MRI and a mammogram every year.  Breast cancer (BRCA) gene test is recommended for women who have family members with BRCA-related cancers. Results of the assessment will determine the need for genetic counseling and BRCA1 and for BRCA2 testing. BRCA-related cancers include these types: ? Breast. This occurs in males or females. ? Ovarian. ? Tubal. This may also be called fallopian tube cancer. ? Cancer of the abdominal or pelvic lining (peritoneal cancer). ? Prostate. ? Pancreatic.  Cervical, Uterine, and Ovarian Cancer Your health care provider may recommend that you be screened regularly for cancer of the pelvic organs. These include your ovaries, uterus, and vagina. This screening involves a pelvic exam, which includes checking for microscopic changes to the surface of your cervix (Pap test).  For women ages 21-65,  health care providers may recommend a pelvic exam and a Pap test every three years. For women ages 66-65, they may recommend the Pap test and pelvic exam, combined with testing for human papilloma virus (HPV), every five years. Some types of HPV increase your risk of cervical cancer. Testing for HPV may also be done on women of any age who have unclear Pap test results.  Other health care providers may not recommend any screening for nonpregnant women who are considered low risk for pelvic cancer and have no symptoms. Ask your health care provider if a screening pelvic exam is right for you.  If you have had past treatment for cervical cancer or a condition that could lead to cancer, you need Pap tests and screening for cancer for at least 20 years after your treatment. If Pap tests have been discontinued for you, your risk factors (such as having a new sexual partner) need to be reassessed to determine if you should start having screenings again. Some women have medical problems that increase the chance of getting cervical cancer. In these cases, your health care provider may recommend that you have screening and Pap tests more often.  If you have a family history of uterine cancer or ovarian cancer, talk with your health care provider about genetic screening.  If  you have vaginal bleeding after reaching menopause, tell your health care provider.  There are currently no reliable tests available to screen for ovarian cancer.  Lung Cancer Lung cancer screening is recommended for adults 70-11 years old who are at high risk for lung cancer because of a history of smoking. A yearly low-dose CT scan of the lungs is recommended if you:  Currently smoke.  Have a history of at least 30 pack-years of smoking and you currently smoke or have quit within the past 15 years. A pack-year is smoking an average of one pack of cigarettes per day for one year.  Yearly screening should:  Continue until it has been  15 years since you quit.  Stop if you develop a health problem that would prevent you from having lung cancer treatment.  Colorectal Cancer  This type of cancer can be detected and can often be prevented.  Routine colorectal cancer screening usually begins at age 72 and continues through age 49.  If you have risk factors for colon cancer, your health care provider may recommend that you be screened at an earlier age.  If you have a family history of colorectal cancer, talk with your health care provider about genetic screening.  Your health care provider may also recommend using home test kits to check for hidden blood in your stool.  A small camera at the end of a tube can be used to examine your colon directly (sigmoidoscopy or colonoscopy). This is done to check for the earliest forms of colorectal cancer.  Direct examination of the colon should be repeated every 5-10 years until age 2. However, if early forms of precancerous polyps or small growths are found or if you have a family history or genetic risk for colorectal cancer, you may need to be screened more often.  Skin Cancer  Check your skin from head to toe regularly.  Monitor any moles. Be sure to tell your health care provider: ? About any new moles or changes in moles, especially if there is a change in a mole's shape or color. ? If you have a mole that is larger than the size of a pencil eraser.  If any of your family members has a history of skin cancer, especially at a young age, talk with your health care provider about genetic screening.  Always use sunscreen. Apply sunscreen liberally and repeatedly throughout the day.  Whenever you are outside, protect yourself by wearing long sleeves, pants, a wide-brimmed hat, and sunglasses.  What should I know about osteoporosis? Osteoporosis is a condition in which bone destruction happens more quickly than new bone creation. After menopause, you may be at an increased  risk for osteoporosis. To help prevent osteoporosis or the bone fractures that can happen because of osteoporosis, the following is recommended:  If you are 20-75 years old, get at least 1,000 mg of calcium and at least 600 mg of vitamin D per day.  If you are older than age 87 but younger than age 41, get at least 1,200 mg of calcium and at least 600 mg of vitamin D per day.  If you are older than age 31, get at least 1,200 mg of calcium and at least 800 mg of vitamin D per day.  Smoking and excessive alcohol intake increase the risk of osteoporosis. Eat foods that are rich in calcium and vitamin D, and do weight-bearing exercises several times each week as directed by your health care provider. What should I  know about how menopause affects my mental health? Depression may occur at any age, but it is more common as you become older. Common symptoms of depression include:  Low or sad mood.  Changes in sleep patterns.  Changes in appetite or eating patterns.  Feeling an overall lack of motivation or enjoyment of activities that you previously enjoyed.  Frequent crying spells.  Talk with your health care provider if you think that you are experiencing depression. What should I know about immunizations? It is important that you get and maintain your immunizations. These include:  Tetanus, diphtheria, and pertussis (Tdap) booster vaccine.  Influenza every year before the flu season begins.  Pneumonia vaccine.  Shingles vaccine.  Your health care provider may also recommend other immunizations. This information is not intended to replace advice given to you by your health care provider. Make sure you discuss any questions you have with your health care provider. Document Released: 09/19/2005 Document Revised: 02/15/2016 Document Reviewed: 05/01/2015 Elsevier Interactive Patient Education  2018 [employer].

## 2017-09-10 ENCOUNTER — Encounter: Payer: Self-pay | Admitting: Cardiovascular Disease

## 2017-09-10 ENCOUNTER — Ambulatory Visit (INDEPENDENT_AMBULATORY_CARE_PROVIDER_SITE_OTHER): Payer: Medicare Other | Admitting: Cardiovascular Disease

## 2017-09-10 VITALS — BP 156/68 | HR 72 | Ht 62.0 in | Wt 143.0 lb

## 2017-09-10 DIAGNOSIS — I2510 Atherosclerotic heart disease of native coronary artery without angina pectoris: Secondary | ICD-10-CM

## 2017-09-10 DIAGNOSIS — I6523 Occlusion and stenosis of bilateral carotid arteries: Secondary | ICD-10-CM | POA: Diagnosis not present

## 2017-09-10 DIAGNOSIS — I739 Peripheral vascular disease, unspecified: Secondary | ICD-10-CM

## 2017-09-10 DIAGNOSIS — I10 Essential (primary) hypertension: Secondary | ICD-10-CM

## 2017-09-10 NOTE — Patient Instructions (Signed)

## 2017-09-10 NOTE — Progress Notes (Signed)
Cardiology Office Note Date:  09/10/2017   ID:  Patricia, [name] 24-Jul-1941, MRN [id number]  PCP:  Colon Branch, MD  Cardiologist:  Sherren Mocha, MD    Chief Complaint  Patient presents with  . 1 year follow up    CAD     History of Present Illness: Patricia [name] is a 77 y.o. female who presents for follow-up evaluation.   The patient has undergone multiple PCI procedures in the past.  Because of refractory angina on maximal therapy she was ultimately referred for multivessel CABG in 2015.  The patient is here alone today.  She is doing very well.  She has not smoked since her bypass surgery 4 years ago.  She denies chest pain, chest pressure, leg swelling, orthopnea, PND, or heart palpitations.  She has some shortness of breath with activity, but stable overall symptoms.   Past Medical History:  Diagnosis Date  . Abnormal LFTs   . Allergic rhinitis   . Anxiety and depression    son commited suicide 2010  . 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 CAD (coronary artery disease)    s/p CABG  . Carotid artery disease ([place])    Moderate - f/u dopplers needed 11/2012  . 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 Zadie Rhine  . COPD (chronic obstructive pulmonary disease) ([place])   . HTN (hypertension)    intolerant to ACEi (cough)  . Hyperlipidemia   . Osteopenia   . PAD (peripheral artery disease) ([place])    Carotid dz as  below, and at [facility] 11/12:  critical stenosis noted just below the sheath site leading into a totally occluded SFA and a patent deep femoral artery  . Panic attacks   . Thyroid nodule     Past Surgical History:  Procedure Laterality Date  . CORONARY ANGIOPLASTY WITH STENT PLACEMENT     "1; makes total of 3" (07/05/2012)  . CORONARY ARTERY BYPASS GRAFT N/A 12/15/2013   Procedure: CORONARY ARTERY BYPASS GRAFTING (CABG);  Surgeon: Gaye Pollack, MD;  Location: [place];  Service: Open Heart Surgery;  Laterality:  N/A;  Times 3 using left internal mammary artery and endoscopically harvested right saphenous vein   . INTRAOPERATIVE TRANSESOPHAGEAL ECHOCARDIOGRAM N/A 12/15/2013   Procedure: INTRAOPERATIVE TRANSESOPHAGEAL ECHOCARDIOGRAM;  Surgeon: Gaye Pollack, MD;  Location: [facility] OR;  Service: Open Heart Surgery;  Laterality: N/A;  . 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 LEFT HEART CATHETERIZATION WITH CORONARY ANGIOGRAM N/A 07/02/2011   Procedure: LEFT HEART CATHETERIZATION WITH CORONARY ANGIOGRAM;  Surgeon: Sherren Mocha, MD;  Location: [facility] CATH LAB;  Service: Cardiovascular;  Laterality: N/A;  . LEFT HEART CATHETERIZATION WITH CORONARY ANGIOGRAM N/A 07/05/2012   Procedure: LEFT HEART CATHETERIZATION WITH CORONARY ANGIOGRAM;  Surgeon: Sherren Mocha, MD;  Location: [facility] CATH LAB;  Service: Cardiovascular;  Laterality: N/A;  . LEFT HEART CATHETERIZATION WITH CORONARY ANGIOGRAM N/A 11/28/2013   Procedure: LEFT HEART CATHETERIZATION WITH CORONARY ANGIOGRAM;  Surgeon: Blane Ohara, MD;  Location: [facility] CATH LAB;  Service: Cardiovascular;  Laterality: N/A;  . PERCUTANEOUS CORONARY INTERVENTION-BALLOON ONLY  07/02/2011   Procedure: PERCUTANEOUS CORONARY INTERVENTION-BALLOON ONLY;  Surgeon: Sherren Mocha, MD;  Location: [facility] CATH LAB;  Service: Cardiovascular;;  . PERCUTANEOUS CORONARY INTERVENTION-BALLOON ONLY  07/05/2012   Procedure: PERCUTANEOUS CORONARY INTERVENTION-BALLOON  ONLY;  Surgeon: Sherren Mocha, MD;  Location: [facility] CATH LAB;  Service: Cardiovascular;;  . VAGINAL HYSTERECTOMY  ~ 1976   no oophorectomy    Current Outpatient Medications  Medication Sig Dispense Refill  . Aflibercept (EYLEA) 2 MG/0.05ML SOLN Inject 2 mg into the eye every 5 (five) weeks. Dr. Zadie Rhine    . amLODipine (NORVASC) 5 MG tablet Take 1 tablet (5 mg total) by mouth daily. 90 tablet 1  . aspirin 81 MG EC tablet Take 1 tablet (81 mg total)  by mouth daily.    Marland Kitchen atorvastatin (LIPITOR) 40 MG tablet Take 1 tablet (40 mg total) by mouth daily with breakfast. 90 tablet 1  . budesonide-formoterol (SYMBICORT) 160-4.5 MCG/ACT inhaler Inhale 2 puffs into the lungs 2 (two) times daily. 1 Inhaler 3  . Cholecalciferol (VITAMIN D3) 1000 UNITS CAPS Take 1,000 Units by mouth daily.    . clonazePAM (KLONOPIN) 0.5 MG tablet TAKE 1 TABLET BY MOUTH IN THE MORNING AS NEEDED FOR ANXIETY AND 1 & 1/2 (ONE & ONE-HALF) TO 2 ONCE DAILY AT BEDTIME AS NEEDED FOR SLEEP 60 tablet 1  . FLUoxetine (PROZAC) 20 MG capsule Take 2 capsules (40 mg total) by mouth daily. 180 capsule 1  . furosemide (LASIX) 40 MG tablet Take 40 mg by mouth daily as needed for edema.    Marland Kitchen KLOR-CON M20 20 MEQ tablet Take 1 tablet by mouth daily as needed. Take it along with lasix    . losartan (COZAAR) 100 MG tablet TAKE ONE TABLET BY MOUTH ONCE DAILY 90 tablet 2  . naproxen sodium (ANAPROX) 220 MG tablet Take 220 mg by mouth 2 (two) times daily with a meal.     Current Facility-Administered Medications  Medication Dose Route Frequency Provider Last Rate Last Dose  . 0.9 %  sodium chloride infusion  500 mL Intravenous Continuous Maurianna Shipper, MD      . 0.9 %  sodium chloride infusion  500 mL Intravenous Continuous Reema Shipper, MD        Allergies:   Ace inhibitors   Social History:  The patient  reports that she quit smoking about 3 years ago. Her smoking use included cigarettes. She has a 27.00 pack-year smoking history. she has never used smokeless tobacco. She reports that she drinks alcohol. She reports that she does not use drugs.   Family History:  The patient's family history includes Coronary artery disease in her father; Healthy in her grandchild; Heart Problems (age of onset: 89) in her sister; Heart attack in her father; Heart attack (age of onset: 41) in her brother; Hypertension in her mother; Stroke in her mother; Suicidality (age of onset: 15) in her son.    ROS:   Please see the history of present illness.  Otherwise, review of systems is positive for vision loss left eye.  All other systems are reviewed and negative.    PHYSICAL EXAM: VS:  BP (!) 156/68   Pulse 72   Ht 5\' 2"  (1.575 m)   Wt 143 lb (64.9 kg)   BMI 26.16 kg/m  , BMI Body mass index is 26.16 kg/m. GEN: Well nourished, well developed, in no acute distress  HEENT: normal  Neck: no JVD, no masses. bilateral carotid bruits Cardiac: RRR without murmur or gallop                Respiratory:  clear to auscultation bilaterally, normal work of breathing GI: soft, nontender, nondistended, + BS MS: no deformity or  atrophy  Ext: no pretibial edema Skin: warm and dry, no rash Neuro:  Strength and sensation are intact Psych: euthymic mood, full affect  EKG:  EKG is ordered today. The ekg ordered today shows normal sinus rhythm 72 bpm, nonspecific ST abnormality  Recent Labs: 06/24/2017: ALT 17; BUN 10; Creatinine, Ser 0.62; Hemoglobin 14.0; Platelets 283.0; Potassium 3.9; Sodium 140   Lipid Panel     Component Value Date/Time   CHOL 154 06/24/2017 1444   TRIG 96.0 06/24/2017 1444   HDL 52.80 06/24/2017 1444   CHOLHDL 3 06/24/2017 1444   VLDL 19.2 06/24/2017 1444   LDLCALC 82 06/24/2017 1444   LDLDIRECT 156.0 04/26/2007 0936      Wt Readings from Last 3 Encounters:  09/10/17 143 lb (64.9 kg)  09/04/17 142 lb (64.4 kg)  06/24/17 142 lb 2 oz (64.5 kg)     Cardiac Studies Reviewed: Carotid Doppler 05-08-2017: 40-59% bilateral carotid stenosis:  ABI 08-28-2017: Final Interpretation: Right: Resting right ankle-brachial index indicates severe right lower extremity arterial disease based on numerical criteria. Unable to obtain right TBI due to flat great toe PPG.  Left: Resting left ankle-brachial index indicates mild left lower extremity arterial disease. The left toe-brachial index is abnormal.  *See table(s) above for measurements and observations.  Suggest follow up study  in 12 months.  ASSESSMENT AND PLAN: 1.  Coronary artery disease, native vessel, with out angina: The patient is stable on her current medical program.  She has done remarkably well since surgical bypass.  Medications are reviewed and no changes are recommended today.  2.  Hypertension: She will continue on amlodipine, furosemide, and losartan.  3.  Hyperlipidemia: The patient is treated with atorvastatin 40 mg daily.  Most recent lipids are reviewed as above.  4.  Peripheral arterial disease: No symptoms of claudication.  No signs or symptoms of resting limb ischemia or skin changes.  Recent vascular study reviewed as above.  Continue with medical management.  5.  Carotid stenosis without history of stroke: Reviewed her carotid study also outlined above.  We will continue to manage her medically as she has no high-grade stenosis.  Current medicines are reviewed with the patient today.  The patient does not have concerns regarding medicines.  Labs/ tests ordered today include:  No orders of the defined types were placed in this encounter.   Disposition:   FU one year  Signed, Sherren Mocha, MD  09/10/2017 11:50 PM    [facility] [address], [place], [place]  [address] Phone: [phone]; Fax: [phone]

## 2017-09-18 ENCOUNTER — Other Ambulatory Visit: Payer: Self-pay

## 2017-09-18 DIAGNOSIS — I2510 Atherosclerotic heart disease of native coronary artery without angina pectoris: Secondary | ICD-10-CM

## 2017-09-19 ENCOUNTER — Telehealth: Payer: Self-pay | Admitting: Internal Medicine

## 2017-09-19 ENCOUNTER — Other Ambulatory Visit: Payer: Self-pay | Admitting: Cardiovascular Disease

## 2017-09-21 NOTE — Telephone Encounter (Signed)
Pt is requesting refill on clonazepam   Last OV: 06/24/2017 Last Fill: 05/08/2017 #60 and 1RF UDS: 03/06/2017 Low risk  NCCR printed- no issues noted  Please advise.

## 2017-09-21 NOTE — Telephone Encounter (Signed)
Sent!

## 2017-10-01 ENCOUNTER — Encounter: Payer: Self-pay | Admitting: Adult Health

## 2017-10-01 ENCOUNTER — Ambulatory Visit (INDEPENDENT_AMBULATORY_CARE_PROVIDER_SITE_OTHER): Payer: Medicare Other | Admitting: Adult Health

## 2017-10-01 ENCOUNTER — Telehealth: Payer: Self-pay | Admitting: Adult Health

## 2017-10-01 DIAGNOSIS — J449 Chronic obstructive pulmonary disease, unspecified: Secondary | ICD-10-CM | POA: Diagnosis not present

## 2017-10-01 DIAGNOSIS — J309 Allergic rhinitis, unspecified: Secondary | ICD-10-CM | POA: Diagnosis not present

## 2017-10-01 DIAGNOSIS — I6523 Occlusion and stenosis of bilateral carotid arteries: Secondary | ICD-10-CM | POA: Diagnosis not present

## 2017-10-01 NOTE — Assessment & Plan Note (Signed)
Cont on current regimen  

## 2017-10-01 NOTE — Progress Notes (Signed)
@Patient  ID: Patricia [name], female    DOB: 12/17/1940, 77 y.o.   MRN: [id number]  Chief Complaint  Patient presents with  . 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  Patient has mild COPD.  Says overall that her breathing is doing okay.  She has had no increased cough or wheezing.  She remains on Symbicort twice daily. Not taking Sprivia , not covered by insurance .  Activity level remains at baseline.  She does get winded with prolonged walking or going up steps. Pneumovax, Prevnar 13 and influenza vaccines are up-to-date Chest x-ray October 2018 showed COPD changes. Does housework. No formal exercise. Lives with grandson . She is independent , drives/shops.   Allergies  Allergen Reactions  . Ace Inhibitors     cough    Immunization History  Administered Date(s) Administered  . Influenza Split 07/24/2011, 04/30/2012  . Influenza Whole 06/14/2007, 06/08/2008, 05/18/2009, 05/09/2010  . Influenza, High Dose Seasonal PF 04/11/2013, 05/13/2016, 06/24/2017  . Influenza,inj,Quad PF,6+ Mos 05/09/2014, 05/28/2015  . Pneumococcal Conjugate-13 04/06/2014  . Pneumococcal Polysaccharide-23 08/11/2005, 07/18/2010  . Td 01/25/2007  . Tdap 10/07/2016    Past Medical History:  Diagnosis Date  . Abnormal LFTs   . Allergic rhinitis   . Anxiety and depression    son commited suicide 2010  . 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 CAD (coronary artery disease)    s/p CABG  . Carotid artery disease ([place])    Moderate - f/u dopplers needed 11/2012  . Central retinal vein occlusion of left  eye 02/2015   Receiving injections in eye every 5 weeks by Dr. Zadie Rhine  . COPD (chronic obstructive pulmonary disease) ([place])   . HTN (hypertension)    intolerant to ACEi (cough)  . Hyperlipidemia   . Osteopenia   . PAD (peripheral artery disease) ([place])    Carotid dz as  below, and at [facility] 11/12:  critical stenosis noted just below the sheath site leading into a totally occluded SFA and a patent deep femoral artery  . Panic attacks   . Thyroid nodule     Tobacco History: Social History   Tobacco Use  Smoking Status Former Smoker  . Packs/day: 0.50  . Years: 54.00  . Pack years: 27.00  . Types: Cigarettes  . Last attempt to quit: 12/15/2013  . Years since quitting: 3.7  Smokeless Tobacco Never Used  Tobacco Comment   quit tobacco 12-2013 after CABG   Counseling given: Not Answered Comment: quit tobacco 12-2013 after CABG   Outpatient Encounter Medications as of 10/01/2017  Medication Sig  . Aflibercept (EYLEA) 2 MG/0.05ML SOLN Inject 2 mg into the eye every 5 (five) weeks. Dr. Zadie Rhine  . amLODipine (NORVASC) 5 MG tablet Take 1 tablet (5 mg total) by mouth daily.  Marland Kitchen aspirin 81 MG EC tablet Take 1 tablet (81 mg total) by mouth daily.  Marland Kitchen atorvastatin (LIPITOR) 40 MG tablet Take 1 tablet (40 mg total) by mouth daily.  Marland Kitchen  budesonide-formoterol (SYMBICORT) 160-4.5 MCG/ACT inhaler Inhale 2 puffs into the lungs 2 (two) times daily.  . Cholecalciferol (VITAMIN D3) 1000 UNITS CAPS Take 1,000 Units by mouth daily.  . clonazePAM (KLONOPIN) 0.5 MG tablet TAKE 1 TABLET BY MOUTH ONCE DAILY IN THE MORNING AS NEEDED FOR ANXIETY AND TAKE 1 & 1/2 (ONE & ONE-HALF) TO 2 AT BEDTIME AS NEEDED FOR SLEE  . FLUoxetine (PROZAC) 20 MG capsule Take 2 capsules (40 mg total) by mouth daily.  . furosemide (LASIX) 40 MG tablet Take 40 mg by mouth daily as needed for edema.  Marland Kitchen KLOR-CON M20 20 MEQ tablet Take 1 tablet by mouth daily as needed. Take it along with lasix  . losartan (COZAAR) 100 MG tablet TAKE 1 TABLET BY  MOUTH ONCE DAILY  . naproxen sodium (ANAPROX) 220 MG tablet Take 220 mg by mouth 2 (two) times daily with a meal.   Facility-Administered Encounter Medications as of 10/01/2017  Medication  . 0.9 %  sodium chloride infusion  . 0.9 %  sodium chloride infusion     Review of Systems  Constitutional:   No  weight loss, night sweats,  Fevers, chills, fatigue, or  lassitude.  HEENT:   No headaches,  Difficulty swallowing,  Tooth/dental problems, or  Sore throat,                No sneezing, itching, ear ache, nasal congestion, post nasal drip,   CV:  No chest pain,  Orthopnea, PND, swelling in lower extremities, anasarca, dizziness, palpitations, syncope.   GI  No heartburn, indigestion, abdominal pain, nausea, vomiting, diarrhea, change in bowel habits, loss of appetite, bloody stools.   Resp:   No coughing up of blood.  No change in color of mucus.  No wheezing.  No chest wall deformity  Skin: no rash or lesions.  GU: no dysuria, change in color of urine, no urgency or frequency.  No flank pain, no hematuria   MS:  No joint pain or swelling.  No decreased range of motion.  No back pain.    Physical Exam  BP 136/70 (BP Location: Right Arm, Cuff Size: Normal)   Pulse 73   Ht 5\' 2"  (1.575 m)   Wt 142 lb (64.4 kg)   SpO2 94%   BMI 25.97 kg/m   GEN: A/Ox3; pleasant , NAD, well nourished    HEENT:  [place]/AT,  EACs-clear, TMs-wnl, NOSE-clear, THROAT-clear, no lesions, no postnasal drip or exudate noted.   NECK:  Supple w/ fair ROM; no JVD; normal carotid impulses w/o bruits; no thyromegaly or nodules palpated; no lymphadenopathy.    RESP  Clear  P & A; w/o, wheezes/ rales/ or rhonchi. no accessory muscle use, no dullness to percussion  CARD:  RRR, no m/r/g, no peripheral edema, pulses intact, no cyanosis or clubbing.  GI:   Soft & nt; nml bowel sounds; no organomegaly or masses detected.   Musco: Warm bil, no deformities or joint swelling noted.   Neuro: alert, no focal  deficits noted.    Skin: Warm, no lesions or rashes    Lab Results:  CBC    Component Value Date/Time   WBC 6.0 06/24/2017 1444   RBC 4.81 06/24/2017 1444   HGB 14.0 06/24/2017 1444   HCT 42.8 06/24/2017 1444   PLT 283.0 06/24/2017 1444   MCV 88.9 06/24/2017 1444   MCH 28.8 06/29/2015 1622   MCHC 32.7 06/24/2017 1444   RDW 13.5 06/24/2017 1444   LYMPHSABS 1.1 06/24/2017 1444  MONOABS 0.5 06/24/2017 1444   EOSABS 0.2 06/24/2017 1444   BASOSABS 0.1 06/24/2017 1444    BMET    Component Value Date/Time   NA 140 06/24/2017 1444   K 3.9 06/24/2017 1444   CL 100 06/24/2017 1444   CO2 29 06/24/2017 1444   GLUCOSE 90 06/24/2017 1444   BUN 10 06/24/2017 1444   CREATININE 0.62 06/24/2017 1444   CREATININE 0.60 03/06/2017 1504   CALCIUM 9.4 06/24/2017 1444   GFRNONAA >90 12/20/2013 0349   GFRAA >90 12/20/2013 0349    BNP No results found for: BNP  ProBNP    Component Value Date/Time   PROBNP 548.4 (H) 07/04/2011 0600    Imaging: No results found.   Assessment & Plan:   COPD (chronic obstructive pulmonary disease) Controlled on present regimen  Referral to LDCT screening   Plan  Patient Instructions  Continue on Symbicort 2 puffs Twice daily . Rinse well after use.  Refer to [facility] Form for CT screening .  Follow up with Dr. Halford Chessman in 1 ydear and As needed        Allergic rhinitis Cont on current regimen .      Rexene Edison, NP 10/01/2017

## 2017-10-01 NOTE — Addendum Note (Signed)
Addended by: Georjean Mode on: 10/01/2017 03:45 PM   Modules accepted: Orders

## 2017-10-01 NOTE — Telephone Encounter (Signed)
Pt is needing sample of sym 160; had none in [facility] location today Need to bring sample to [facility] next week, call pt once in [facility] location for p/u Will follow up next week on this sample

## 2017-10-01 NOTE — Assessment & Plan Note (Signed)
Controlled on present regimen  Referral to LDCT screening   Plan  Patient Instructions  Continue on Symbicort 2 puffs Twice daily . Rinse well after use.  Refer to Eric Form for CT screening .  Follow up with Dr. Halford Chessman in 1 ydear and As needed

## 2017-10-01 NOTE — Patient Instructions (Signed)
Continue on Symbicort 2 puffs Twice daily . Rinse well after use.  Refer to Eric Form for CT screening .  Follow up with Dr. Halford Chessman in 1 ydear and As needed

## 2017-10-02 ENCOUNTER — Other Ambulatory Visit: Payer: Self-pay | Admitting: Acute Care

## 2017-10-02 DIAGNOSIS — Z87891 Personal history of nicotine dependence: Secondary | ICD-10-CM

## 2017-10-02 DIAGNOSIS — Z122 Encounter for screening for malignant neoplasm of respiratory organs: Secondary | ICD-10-CM

## 2017-10-02 NOTE — Progress Notes (Signed)
Reviewed and agree with assessment/plan.   Akshath Mccarey, MD [facility] 08/06/2016, 12:24 PM Pager:  [phone]  

## 2017-10-12 NOTE — Telephone Encounter (Signed)
Left message for pt on VM that samples are ready in [facility] location are ready for pick up 10/09/2017

## 2017-10-16 ENCOUNTER — Ambulatory Visit (INDEPENDENT_AMBULATORY_CARE_PROVIDER_SITE_OTHER): Payer: Medicare Other | Admitting: Acute Care

## 2017-10-16 ENCOUNTER — Ambulatory Visit (INDEPENDENT_AMBULATORY_CARE_PROVIDER_SITE_OTHER)
Admission: RE | Admit: 2017-10-16 | Discharge: 2017-10-16 | Disposition: A | Discharge status: Home / Self Care | Payer: Medicare Other | Source: Ambulatory Visit | Attending: Acute Care | Admitting: Acute Care

## 2017-10-16 ENCOUNTER — Encounter: Payer: Self-pay | Admitting: Acute Care

## 2017-10-16 DIAGNOSIS — Z87891 Personal history of nicotine dependence: Secondary | ICD-10-CM

## 2017-10-16 DIAGNOSIS — Z122 Encounter for screening for malignant neoplasm of respiratory organs: Secondary | ICD-10-CM

## 2017-10-16 NOTE — Progress Notes (Signed)
Shared Decision Making Visit Lung Cancer Screening Program [phone])   Eligibility:  Age 77 y.o.  Pack Years Smoking History Calculation 42 pack year smoking history (# packs/per year x # years smoked)  Recent History of coughing up blood  no  Unexplained weight loss? no ( >Than 15 pounds within the last 6 months )  Prior History Lung / other cancer no (Diagnosis within the last 5 years already requiring surveillance chest CT Scans).  Smoking Status Former Smoker  Former Smokers: Years since quit: 4 years  Quit Date: 12/2013  Visit Components:  Discussion included one or more decision making aids. yes  Discussion included risk/benefits of screening. yes  Discussion included potential follow up diagnostic testing for abnormal scans. yes  Discussion included meaning and risk of over diagnosis. yes  Discussion included meaning and risk of False Positives. yes  Discussion included meaning of total radiation exposure. yes  Counseling Included:  Importance of adherence to annual lung cancer LDCT screening. yes  Impact of comorbidities on ability to participate in the program. yes  Ability and willingness to under diagnostic treatment. yes  Smoking Cessation Counseling:  Current Smokers:   Discussed importance of smoking cessation. NA  Information about tobacco cessation classes and interventions provided to patient. yes  Patient provided with "ticket" for LDCT Scan. yes  Symptomatic Patient. no  Counseling  Diagnosis Code: Tobacco Use Z72.0  Asymptomatic Patient yes  Counseling (Intermediate counseling: > three minutes counseling) U0454  Former Smokers:   Discussed the importance of maintaining cigarette abstinence. yes  Diagnosis Code: Personal History of Nicotine Dependence. U98.119  Information about tobacco cessation classes and interventions provided to patient. Yes  Patient provided with "ticket" for LDCT Scan. yes  Written Order for Lung Cancer  Screening with LDCT placed in Epic. Yes (CT Chest Lung Cancer Screening Low Dose W/O CM) [id number] Z12.2-Screening of respiratory organs Z87.891-Personal history of nicotine dependence  I spent 25 minutes of face to face time with Ms. Jelinek discussing the risks and benefits of lung cancer screening. We viewed a power point together that explained in detail the above noted topics. We took the time to pause the power point at intervals to allow for questions to be asked and answered to ensure understanding. We discussed that she had taken the single most powerful action possible to decrease her risk of developing lung cancer when she quit smoking. I counseled her to remain smoke free, and to contact me if she ever had the desire to smoke again so that I can provide resources and tools to help support the effort to remain smoke free. We discussed the time and location of the scan, and that either  Doroteo Glassman RN or I will call with the results within  24-48 hours of receiving them. She has my card and contact information in the event she needs to speak with me, in addition to a copy of the power point we reviewed as a resource. She verbalized understanding of all of the above and had no further questions upon leaving the office.     I explained to the patient that there has been a high incidence of coronary artery disease noted on these exams. I explained that this is a non-gated exam therefore degree or severity cannot be determined. This patient is on statin therapy. I have asked the patient to follow-up with their PCP regarding any incidental finding of coronary artery disease and management with diet or medication as they feel is clinically indicated.  The patient verbalized understanding of the above and had no further questions.     Magdalen Spatz, NP 10/16/2017 3:00 PM

## 2017-11-17 DIAGNOSIS — H34812 Central retinal vein occlusion, left eye, with macular edema: Secondary | ICD-10-CM | POA: Diagnosis not present

## 2017-11-17 DIAGNOSIS — H43812 Vitreous degeneration, left eye: Secondary | ICD-10-CM | POA: Diagnosis not present

## 2017-11-17 DIAGNOSIS — H35352 Cystoid macular degeneration, left eye: Secondary | ICD-10-CM | POA: Diagnosis not present

## 2017-12-23 DIAGNOSIS — H35352 Cystoid macular degeneration, left eye: Secondary | ICD-10-CM | POA: Diagnosis not present

## 2017-12-23 DIAGNOSIS — H43811 Vitreous degeneration, right eye: Secondary | ICD-10-CM | POA: Diagnosis not present

## 2017-12-23 DIAGNOSIS — H34812 Central retinal vein occlusion, left eye, with macular edema: Secondary | ICD-10-CM | POA: Diagnosis not present

## 2017-12-23 DIAGNOSIS — H35012 Changes in retinal vascular appearance, left eye: Secondary | ICD-10-CM | POA: Diagnosis not present

## 2017-12-23 DIAGNOSIS — H35042 Retinal micro-aneurysms, unspecified, left eye: Secondary | ICD-10-CM | POA: Diagnosis not present

## 2018-01-25 ENCOUNTER — Telehealth: Payer: Self-pay

## 2018-01-25 MED ORDER — CLONAZEPAM 0.5 MG PO TABS: ORAL_TABLET | ORAL | 1 refills | Status: DC

## 2018-01-25 NOTE — Telephone Encounter (Signed)
Pt is requesting refill on clonazepam.   Last OV: 09/04/2017 Last Fill: 09/21/2017 #60 and 1RF UDS: 03/06/2017 Low risk  NCCR printed- no discrepancies noted- sent for scanning  Please advise.

## 2018-01-25 NOTE — Telephone Encounter (Signed)
sent 

## 2018-02-03 DIAGNOSIS — H34812 Central retinal vein occlusion, left eye, with macular edema: Secondary | ICD-10-CM | POA: Diagnosis not present

## 2018-02-03 DIAGNOSIS — H35042 Retinal micro-aneurysms, unspecified, left eye: Secondary | ICD-10-CM | POA: Diagnosis not present

## 2018-02-03 DIAGNOSIS — H35012 Changes in retinal vascular appearance, left eye: Secondary | ICD-10-CM | POA: Diagnosis not present

## 2018-02-03 DIAGNOSIS — H35352 Cystoid macular degeneration, left eye: Secondary | ICD-10-CM | POA: Diagnosis not present

## 2018-02-15 DIAGNOSIS — H348322 Tributary (branch) retinal vein occlusion, left eye, stable: Secondary | ICD-10-CM | POA: Diagnosis not present

## 2018-02-15 DIAGNOSIS — Z961 Presence of intraocular lens: Secondary | ICD-10-CM | POA: Diagnosis not present

## 2018-02-15 DIAGNOSIS — H40013 Open angle with borderline findings, low risk, bilateral: Secondary | ICD-10-CM | POA: Diagnosis not present

## 2018-03-25 DIAGNOSIS — H34812 Central retinal vein occlusion, left eye, with macular edema: Secondary | ICD-10-CM | POA: Diagnosis not present

## 2018-03-25 DIAGNOSIS — H35042 Retinal micro-aneurysms, unspecified, left eye: Secondary | ICD-10-CM | POA: Diagnosis not present

## 2018-03-25 DIAGNOSIS — H35012 Changes in retinal vascular appearance, left eye: Secondary | ICD-10-CM | POA: Diagnosis not present

## 2018-03-25 DIAGNOSIS — H34822 Venous engorgement, left eye: Secondary | ICD-10-CM | POA: Diagnosis not present

## 2018-03-25 DIAGNOSIS — H35352 Cystoid macular degeneration, left eye: Secondary | ICD-10-CM | POA: Diagnosis not present

## 2018-04-09 ENCOUNTER — Other Ambulatory Visit: Payer: Self-pay | Admitting: Internal Medicine

## 2018-04-22 DIAGNOSIS — H35352 Cystoid macular degeneration, left eye: Secondary | ICD-10-CM | POA: Diagnosis not present

## 2018-04-22 DIAGNOSIS — H35042 Retinal micro-aneurysms, unspecified, left eye: Secondary | ICD-10-CM | POA: Diagnosis not present

## 2018-04-22 DIAGNOSIS — H35012 Changes in retinal vascular appearance, left eye: Secondary | ICD-10-CM | POA: Diagnosis not present

## 2018-04-22 DIAGNOSIS — H34812 Central retinal vein occlusion, left eye, with macular edema: Secondary | ICD-10-CM | POA: Diagnosis not present

## 2018-04-26 ENCOUNTER — Ambulatory Visit (HOSPITAL_COMMUNITY)
Admission: RE | Admit: 2018-04-26 | Discharge: 2018-04-26 | Disposition: A | Discharge status: Home / Self Care | Payer: Medicare Other | Source: Ambulatory Visit | Attending: Internal Medicine | Admitting: Internal Medicine

## 2018-04-26 DIAGNOSIS — I6523 Occlusion and stenosis of bilateral carotid arteries: Secondary | ICD-10-CM | POA: Insufficient documentation

## 2018-04-27 ENCOUNTER — Telehealth: Payer: Self-pay

## 2018-04-27 DIAGNOSIS — I6523 Occlusion and stenosis of bilateral carotid arteries: Secondary | ICD-10-CM

## 2018-04-27 NOTE — Telephone Encounter (Signed)
-----   Message from Sherren Mocha, MD sent at 04/27/2018  2:28 PM EDT ----- Reviewed. Nonobstructive carotid stenosis noted. 12 month FU recommended.

## 2018-04-27 NOTE — Telephone Encounter (Signed)
Informed patient of results and verbal understanding expressed.  Repeat carotids ordered to be scheduled in 1 year. Patient agrees with treatment plan. 

## 2018-04-28 ENCOUNTER — Telehealth: Payer: Self-pay | Admitting: Internal Medicine

## 2018-04-28 MED ORDER — AMLODIPINE BESYLATE 5 MG PO TABS: 5.0000 mg | ORAL_TABLET | Freq: Every day | ORAL | 0 refills | Status: DC

## 2018-04-28 MED ORDER — ATORVASTATIN CALCIUM 40 MG PO TABS: 40.0000 mg | ORAL_TABLET | Freq: Every day | ORAL | 0 refills | Status: DC

## 2018-04-28 NOTE — Telephone Encounter (Signed)
atorvastatin refill Last Refill:09/21/17 # 90 1 RF Last OV: 06/24/17 (pt has upcoming appt 05/03/18) PCP: Dr Larose Kells Pharmacy:[facility] [phone]  amlodipine refill Last Refill:09/21/17 # 90 1 RF Last OV: 06/24/17  Pt given partial refill to get to the appt on 9/23

## 2018-04-28 NOTE — Telephone Encounter (Signed)
Copied from [place] [id number]. Topic: Quick Communication - Rx Refill/Question >> Apr 28, 2018 10:54 AM Sheran Luz wrote: Medication: atorvastatin (LIPITOR) 40 MG tablet and amLODipine (NORVASC) 5 MG tablet  Pt would like a partial refill of these medications until her next appt. Pt states she will be out tomorrow. Next appointment 9/23. Please advise.   Has the patient contacted their pharmacy? Yes, pharmacy advised pt to contact office.  Preferred Pharmacy (with phone number or street name):  [facility], [place]  [phone] (Phone) [phone] (Fax)

## 2018-05-03 ENCOUNTER — Ambulatory Visit (INDEPENDENT_AMBULATORY_CARE_PROVIDER_SITE_OTHER): Payer: Medicare Other | Admitting: Internal Medicine

## 2018-05-03 ENCOUNTER — Encounter: Payer: Self-pay | Admitting: Internal Medicine

## 2018-05-03 ENCOUNTER — Ambulatory Visit: Payer: Medicare Other

## 2018-05-03 VITALS — BP 122/70 | HR 83 | Temp 98.0°F | Resp 16 | Ht 62.0 in | Wt 144.5 lb

## 2018-05-03 DIAGNOSIS — I6523 Occlusion and stenosis of bilateral carotid arteries: Secondary | ICD-10-CM | POA: Diagnosis not present

## 2018-05-03 DIAGNOSIS — F419 Anxiety disorder, unspecified: Secondary | ICD-10-CM

## 2018-05-03 DIAGNOSIS — Z79899 Other long term (current) drug therapy: Secondary | ICD-10-CM

## 2018-05-03 DIAGNOSIS — E041 Nontoxic single thyroid nodule: Secondary | ICD-10-CM | POA: Diagnosis not present

## 2018-05-03 DIAGNOSIS — F329 Major depressive disorder, single episode, unspecified: Secondary | ICD-10-CM

## 2018-05-03 DIAGNOSIS — F32A Depression, unspecified: Secondary | ICD-10-CM

## 2018-05-03 DIAGNOSIS — Z23 Encounter for immunization: Secondary | ICD-10-CM

## 2018-05-03 DIAGNOSIS — Z0000 Encounter for general adult medical examination without abnormal findings: Secondary | ICD-10-CM

## 2018-05-03 DIAGNOSIS — J449 Chronic obstructive pulmonary disease, unspecified: Secondary | ICD-10-CM

## 2018-05-03 DIAGNOSIS — E785 Hyperlipidemia, unspecified: Secondary | ICD-10-CM

## 2018-05-03 DIAGNOSIS — Z09 Encounter for follow-up examination after completed treatment for conditions other than malignant neoplasm: Secondary | ICD-10-CM

## 2018-05-03 NOTE — Progress Notes (Signed)
Pre visit review using our clinic review tool, if applicable. No additional management support is needed unless otherwise documented below in the visit note. 

## 2018-05-03 NOTE — Progress Notes (Signed)
Subjective:    Patient ID: Patricia [name], female    DOB: Jan 23, 1941, 77 y.o.   MRN: [id number]  DOS:  05/03/2018 Type of visit - description : rov Interval history: HTN: Good compliance with medication, no recent ambulatory BPs High cholesterol, on Lipitor, no unusual aches or pains, she does have back pain. COPD: Notes from pulmonary CT chest reviewed CAD: Note from cardiology reviewed  Review of Systems Denies fever chills No chest pain Shortness of breath at baseline Rarely has cough, denies mucus production or hemoptysis  Past Medical History:  Diagnosis Date  . Abnormal LFTs   . Allergic rhinitis   . Anxiety and depression    son commited suicide 2010  . 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 CAD (coronary artery disease)    s/p CABG  . Carotid artery disease ([place])    Moderate - f/u dopplers needed 11/2012  . 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 Zadie Rhine  . COPD (chronic obstructive pulmonary disease) ([place])   . HTN (hypertension)    intolerant to ACEi (cough)  . Hyperlipidemia   . Osteopenia   . PAD (peripheral artery disease) ([place])    Carotid dz as  below, and at [facility] 11/12:  critical stenosis noted just below the sheath site leading into a totally occluded SFA and a patent deep femoral artery  . Panic attacks   . Thyroid nodule     Past Surgical History:  Procedure Laterality Date  . CORONARY ANGIOPLASTY WITH STENT PLACEMENT     "1; makes total of 3" (07/05/2012)  . CORONARY ARTERY BYPASS GRAFT N/A 12/15/2013   Procedure: CORONARY ARTERY BYPASS GRAFTING (CABG);  Surgeon: Gaye Pollack, MD;  Location: [place];  Service: Open Heart Surgery;  Laterality: N/A;  Times 3 using left internal mammary artery and endoscopically harvested right saphenous vein   . INTRAOPERATIVE TRANSESOPHAGEAL ECHOCARDIOGRAM N/A 12/15/2013   Procedure: INTRAOPERATIVE TRANSESOPHAGEAL ECHOCARDIOGRAM;  Surgeon: Gaye Pollack, MD;  Location:  [facility] OR;  Service: Open Heart Surgery;  Laterality: N/A;  . 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 LEFT HEART CATHETERIZATION WITH CORONARY ANGIOGRAM N/A 07/02/2011   Procedure: LEFT HEART CATHETERIZATION WITH CORONARY ANGIOGRAM;  Surgeon: Sherren Mocha, MD;  Location: [facility] CATH LAB;  Service: Cardiovascular;  Laterality: N/A;  . LEFT HEART CATHETERIZATION WITH CORONARY ANGIOGRAM N/A 07/05/2012   Procedure: LEFT HEART CATHETERIZATION WITH CORONARY ANGIOGRAM;  Surgeon: Sherren Mocha, MD;  Location: [facility] CATH LAB;  Service: Cardiovascular;  Laterality: N/A;  . LEFT HEART CATHETERIZATION WITH CORONARY ANGIOGRAM N/A 11/28/2013   Procedure: LEFT HEART CATHETERIZATION WITH CORONARY ANGIOGRAM;  Surgeon: Blane Ohara, MD;  Location: [facility] CATH LAB;  Service: Cardiovascular;  Laterality: N/A;  . PERCUTANEOUS CORONARY INTERVENTION-BALLOON ONLY  07/02/2011   Procedure: PERCUTANEOUS CORONARY INTERVENTION-BALLOON ONLY;  Surgeon: Sherren Mocha, MD;  Location: [facility] CATH LAB;  Service: Cardiovascular;;  . PERCUTANEOUS CORONARY INTERVENTION-BALLOON ONLY  07/05/2012   Procedure: PERCUTANEOUS CORONARY INTERVENTION-BALLOON ONLY;  Surgeon: Sherren Mocha, MD;  Location: [facility] CATH LAB;  Service: Cardiovascular;;  . VAGINAL HYSTERECTOMY  ~ 1976   no oophorectomy    Social History   Socioeconomic History  . Marital status: Widowed    Spouse name: Not on file  . Number of children: 1  . Years of education: Not on  file  . Highest education level: Not on file  Occupational History  . Occupation: Retired-2003  Social Needs  . Financial resource strain: Not on file  . Food insecurity:    Worry: Not on file    Inability: Not on file  . Transportation needs:    Medical: Not on file    Non-medical: Not on file  Tobacco Use  . Smoking status: Former Smoker    Packs/day: 0.50    Years: 54.00    Pack years: 27.00     Types: Cigarettes    Last attempt to quit: 12/15/2013    Years since quitting: 4.3  . Smokeless tobacco: Never Used  . Tobacco comment: quit tobacco 12-2013 after CABG  Substance and Sexual Activity  . Alcohol use: Yes  . Drug use: No  . Sexual activity: Not Currently    Birth control/protection: Surgical  Lifestyle  . Physical activity:    Days per week: Not on file    Minutes per session: Not on file  . Stress: Not on file  Relationships  . Social connections:    Talks on phone: Not on file    Gets together: Not on file    Attends religious service: Not on file    Active member of club or organization: Not on file    Attends meetings of clubs or organizations: Not on file    Relationship status: Not on file  . Intimate partner violence:    Fear of current or ex partner: Not on file    Emotionally abused: Not on file    Physically abused: Not on file    Forced sexual activity: Not on file  Other Topics Concern  . Not on file  Social History Narrative   G son lives w/ her    HC-POA -- sister's daughter       Allergies as of 05/03/2018      Reactions   Ace Inhibitors    cough      Medication List        Accurate as of 05/03/18 11:59 PM. Always use your most recent med list.          amLODipine 5 MG tablet Commonly known as:  NORVASC Take 1 tablet (5 mg total) by mouth daily.   aspirin 81 MG EC tablet Take 1 tablet (81 mg total) by mouth daily.   atorvastatin 40 MG tablet Commonly known as:  LIPITOR Take 1 tablet (40 mg total) by mouth daily.   budesonide-formoterol 160-4.5 MCG/ACT inhaler Commonly known as:  SYMBICORT Inhale 2 puffs into the lungs 2 (two) times daily.   clonazePAM 0.5 MG tablet Commonly known as:  KLONOPIN TAKE 1 TABLET BY MOUTH ONCE DAILY IN THE MORNING AS NEEDED FOR ANXIETY AND TAKE 1 & 1/2 (ONE & ONE-HALF) TO 2 AT BEDTIME AS NEEDED FOR SLEE   EYLEA 2 MG/0.05ML Soln Generic drug:  Aflibercept Inject 2 mg into the eye every 5 (five)  weeks. Dr. Zadie Rhine   FLUoxetine 20 MG capsule Commonly known as:  PROZAC Take 2 capsules (40 mg total) by mouth daily.   furosemide 40 MG tablet Commonly known as:  LASIX Take 40 mg by mouth daily as needed for edema.   KLOR-CON M20 20 MEQ tablet Generic drug:  potassium chloride SA Take 1 tablet by mouth daily as needed. Take it along with lasix   losartan 100 MG tablet Commonly known as:  COZAAR TAKE 1 TABLET BY MOUTH ONCE DAILY  naproxen sodium 220 MG tablet Commonly known as:  ALEVE Take 220 mg by mouth 2 (two) times daily with a meal.   Vitamin D3 1000 units Caps Take 1,000 Units by mouth daily.          Objective:   Physical Exam BP 122/70 (BP Location: Left Arm, Patient Position: Sitting, Cuff Size: Small)   Pulse 83   Temp 98 F (36.7 C) (Oral)   Resp 16   Ht 5\' 2"  (1.575 m)   Wt 144 lb 8 oz (65.5 kg)   SpO2 98%   BMI 26.43 kg/m  General:   Well developed, NAD, see BMI.  HEENT:  Normocephalic . Face symmetric, atraumatic Neck: Thyroid is slightly enlarged, she does have a approximately 1.5 cm R thyroid nodule, nontender. Lungs:  Decreased breath sounds Normal respiratory effort, no intercostal retractions, no accessory muscle use. Heart: RRR,  no murmur.  no pretibial edema bilaterally  Abdomen:  Not distended, soft, non-tender. No rebound or rigidity.    Skin: Not pale. Not jaundice Neurologic:  alert & oriented X3.  Speech normal, gait appropriate for age and unassisted Psych--  Cognition and judgment appear intact.  Cooperative with normal attention span and concentration.  Behavior appropriate. No anxious or depressed appearing.     Assessment & Plan:   Assessment   Hyperglycemia, A1c 5.7 HTN Hyperlipidemia Anxiety depression COPD , quit tobacco 2015 DJD CV: --CAD,cath 2012, 2013, CABG 2015 --PAD -   ABIs 2012, 07-2015 : Stable moderate disease left, normal right  --Carotid disease - [place] 04-2015, mod dz, fu 1 year --Central  Retinal vein occlusion 02-2015 --Palpable aorta US abdomen 06-2015 no AA   Increase LFTs, elevated alkaline phosphatase , [place] abd neg 06-2015  Thyroid nodule --[place] 2012 stable (since 2009)   PLAN Hyperglycemia: Last A1c satisfactory.  No change HTN: Currently on amlodipine, Lasix, potassium and losartan.  Check CMP and CBC High cholesterol: On Lipitor, check a FLP. Anxiety, depression: Symptoms control on clonazepam.  UDS and contract today DJD: Takes naproxen twice a day, recommend to take Tylenol to minimize the use of NSAIDs.  See instructions COPD: Last visit with pulmonary 09/2017, felt to be stable.  Currently uses her inhalers as needed. They proceeded  with a CT chest lung cancer screening:  It showed emphysema.  No worrisome pulmonary nodules but per RADs 2 recommendations should continue yearly CTs.  That will have to be done outside with a pulmonary screening program due to age.  Patient is not sure if she likes to proceed. Re asess on RTC Elevated alkaline phosphate: Rechecking today, bone scintigraphy if is still elevated. Thyroid nodule: Visualized on CT chest scan 10-2017, we discussed doing ultrasound: declined (last [place] 2012) Check a TSH. CAD, PVD, Carotid stenosis: Per cardiology. RTC 6 months

## 2018-05-03 NOTE — Patient Instructions (Signed)
GO TO THE LAB : Get the blood work     GO TO THE FRONT DESK Schedule your next appointment for a checkup in 6 months  Please consider immunization called Shingrix  For pain: Tylenol  500 mg OTC 1 or 2 tabs   every 12 hours as needed for pain Naproxen OTC 1 tab a day with food if needed

## 2018-05-03 NOTE — Assessment & Plan Note (Addendum)
Td 2018; pnm 23: 2011; pnm 13: 2015; flu shot today; shingrex discussed , not interested  No further breast-cervical-colon ca screening

## 2018-05-04 LAB — LIPID PANEL
CHOL/HDL RATIO: 2
CHOLESTEROL: 134 mg/dL (ref 0–200)
HDL: 60 mg/dL (ref >39.00)
LDL CALC: 56 mg/dL (ref 0–99)
NonHDL: 73.57
TRIGLYCERIDES: 89 mg/dL (ref 0.0–149.0)
VLDL: 17.8 mg/dL (ref 0.0–40.0)

## 2018-05-04 LAB — CBC WITH DIFFERENTIAL/PLATELET
BASOS ABS: 0.1 10*3/uL (ref 0.0–0.1)
Basophils Relative: 1.8 % (ref 0.0–3.0)
Eosinophils Absolute: 0.2 10*3/uL (ref 0.0–0.7)
Eosinophils Relative: 2.1 % (ref 0.0–5.0)
HEMATOCRIT: 43.9 % (ref 36.0–46.0)
Hemoglobin: 14.5 g/dL (ref 12.0–15.0)
LYMPHS PCT: 20 % (ref 12.0–46.0)
Lymphs Abs: 1.7 10*3/uL (ref 0.7–4.0)
MCHC: 32.9 g/dL (ref 30.0–36.0)
MCV: 88.4 fl (ref 78.0–100.0)
MONOS PCT: 7.2 % (ref 3.0–12.0)
Monocytes Absolute: 0.6 10*3/uL (ref 0.1–1.0)
Neutro Abs: 5.7 10*3/uL (ref 1.4–7.7)
Neutrophils Relative %: 68.9 % (ref 43.0–77.0)
PLATELETS: 266 10*3/uL (ref 150.0–400.0)
RBC: 4.97 Mil/uL (ref 3.87–5.11)
RDW: 14.6 % (ref 11.5–15.5)
WBC: 8.3 10*3/uL (ref 4.0–10.5)

## 2018-05-04 LAB — COMPREHENSIVE METABOLIC PANEL
ALT: 13 U/L (ref 0–35)
AST: 16 U/L (ref 0–37)
Albumin: 3.8 g/dL (ref 3.5–5.2)
Alkaline Phosphatase: 160 U/L — ABNORMAL HIGH (ref 39–117)
BILIRUBIN TOTAL: 0.3 mg/dL (ref 0.2–1.2)
BUN: 16 mg/dL (ref 6–23)
CALCIUM: 9.7 mg/dL (ref 8.4–10.5)
CHLORIDE: 102 meq/L (ref 96–112)
CO2: 31 mEq/L (ref 19–32)
Creatinine, Ser: 0.58 mg/dL (ref 0.40–1.20)
GFR: 106.81 mL/min (ref >60.00)
Glucose, Bld: 104 mg/dL — ABNORMAL HIGH (ref 70–99)
POTASSIUM: 4.3 meq/L (ref 3.5–5.1)
Sodium: 141 mEq/L (ref 135–145)
Total Protein: 6.2 g/dL (ref 6.0–8.3)

## 2018-05-04 LAB — TSH: TSH: 1.45 u[IU]/mL (ref 0.35–4.50)

## 2018-05-04 NOTE — Assessment & Plan Note (Signed)
Hyperglycemia: Last A1c satisfactory.  No change HTN: Currently on amlodipine, Lasix, potassium and losartan.  Check CMP and CBC High cholesterol: On Lipitor, check a FLP. Anxiety, depression: Symptoms control on clonazepam.  UDS and contract today DJD: Takes naproxen twice a day, recommend to take Tylenol to minimize the use of NSAIDs.  See instructions COPD: Last visit with pulmonary 09/2017, felt to be stable.  Currently uses her inhalers as needed. They proceeded  with a CT chest lung cancer screening:  It showed emphysema.  No worrisome pulmonary nodules but per RADs 2 recommendations should continue yearly CTs.  That will have to be done outside with a pulmonary screening program due to age.  Patient is not sure if she likes to proceed. Re asess on RTC Elevated alkaline phosphate: Rechecking today, bone scintigraphy if is still elevated. Thyroid nodule: Visualized on CT chest scan 10-2017, we discussed doing ultrasound: declined (last [place] 2012) Check a TSH. CAD, PVD, Carotid stenosis: Per cardiology. RTC 6 months

## 2018-05-07 ENCOUNTER — Other Ambulatory Visit: Payer: Self-pay | Admitting: Internal Medicine

## 2018-05-10 ENCOUNTER — Other Ambulatory Visit: Payer: Self-pay | Admitting: Internal Medicine

## 2018-05-10 DIAGNOSIS — R748 Abnormal levels of other serum enzymes: Secondary | ICD-10-CM

## 2018-05-10 LAB — PAIN MGMT, PROFILE 8 W/CONF, U

## 2018-05-10 NOTE — Progress Notes (Signed)
Bone scan

## 2018-05-14 ENCOUNTER — Encounter (HOSPITAL_COMMUNITY): Payer: Medicare Other

## 2018-05-14 ENCOUNTER — Other Ambulatory Visit: Payer: Self-pay | Admitting: Internal Medicine

## 2018-05-24 ENCOUNTER — Encounter (HOSPITAL_COMMUNITY)
Admission: RE | Admit: 2018-05-24 | Discharge: 2018-05-24 | Disposition: A | Discharge status: Home / Self Care | Payer: Medicare Other | Source: Ambulatory Visit | Attending: Internal Medicine | Admitting: Internal Medicine

## 2018-05-24 DIAGNOSIS — R748 Abnormal levels of other serum enzymes: Secondary | ICD-10-CM | POA: Insufficient documentation

## 2018-05-24 MED ORDER — TECHNETIUM TC 99M MEDRONATE IV KIT: 20.0000 | PACK | Freq: Once | INTRAVENOUS | Status: DC | PRN

## 2018-06-02 ENCOUNTER — Telehealth: Payer: Self-pay | Admitting: Internal Medicine

## 2018-06-02 NOTE — Telephone Encounter (Signed)
sent 

## 2018-06-02 NOTE — Telephone Encounter (Signed)
Pt is requesting refill on clonazepam.   Last OV: 05/03/2018 Last Fill: 01/25/2018 #60 and 1RF UDS: 05/03/2018- insufficient quantity of urine given

## 2018-07-21 ENCOUNTER — Other Ambulatory Visit: Payer: Self-pay | Admitting: Internal Medicine

## 2018-07-22 DIAGNOSIS — H35042 Retinal micro-aneurysms, unspecified, left eye: Secondary | ICD-10-CM | POA: Diagnosis not present

## 2018-07-22 DIAGNOSIS — H34812 Central retinal vein occlusion, left eye, with macular edema: Secondary | ICD-10-CM | POA: Diagnosis not present

## 2018-07-22 DIAGNOSIS — H35352 Cystoid macular degeneration, left eye: Secondary | ICD-10-CM | POA: Diagnosis not present

## 2018-07-22 DIAGNOSIS — H35012 Changes in retinal vascular appearance, left eye: Secondary | ICD-10-CM | POA: Diagnosis not present

## 2018-07-22 NOTE — Telephone Encounter (Signed)
E-scribing is down- LMOM for [facility] for fluoxetine 20mg - 2 capsules by mouth daily, #180 and 2RF.

## 2018-09-06 ENCOUNTER — Ambulatory Visit: Payer: Medicare Other | Admitting: *Deleted

## 2018-11-03 ENCOUNTER — Telehealth: Payer: Self-pay

## 2018-11-03 ENCOUNTER — Other Ambulatory Visit: Payer: Self-pay | Admitting: Internal Medicine

## 2018-11-03 ENCOUNTER — Other Ambulatory Visit: Payer: Self-pay | Admitting: Cardiovascular Disease

## 2018-11-03 MED ORDER — CLONAZEPAM 0.5 MG PO TABS: ORAL_TABLET | ORAL | 0 refills | Status: DC

## 2018-11-03 NOTE — Telephone Encounter (Signed)
Prescription sent, #60, no refills

## 2018-11-03 NOTE — Telephone Encounter (Signed)
Pt is overdue for visit. LMOM to see if she would be interested in virtual visit w/ PCP.

## 2018-11-03 NOTE — Telephone Encounter (Signed)
Pt is requesting refill on clonazepam.   Last OV: 05/03/2018 (LMOM informing Pt that she is due for visit, asked if she would be interested in virtual visit) Last Fill: 06/02/2018 #60 and 2RF UDS: 05/03/2018 Insufficient quantity of urine

## 2018-11-09 ENCOUNTER — Ambulatory Visit (INDEPENDENT_AMBULATORY_CARE_PROVIDER_SITE_OTHER): Payer: Medicare Other | Admitting: Internal Medicine

## 2018-11-09 ENCOUNTER — Other Ambulatory Visit: Payer: Self-pay

## 2018-11-09 DIAGNOSIS — F419 Anxiety disorder, unspecified: Secondary | ICD-10-CM | POA: Diagnosis not present

## 2018-11-09 DIAGNOSIS — F329 Major depressive disorder, single episode, unspecified: Secondary | ICD-10-CM

## 2018-11-09 DIAGNOSIS — J449 Chronic obstructive pulmonary disease, unspecified: Secondary | ICD-10-CM | POA: Diagnosis not present

## 2018-11-09 DIAGNOSIS — E785 Hyperlipidemia, unspecified: Secondary | ICD-10-CM

## 2018-11-09 NOTE — Progress Notes (Signed)
Subjective:    Patient ID: Patricia [name], female    DOB: April 21, 1941, 78 y.o.   MRN: [id number]  DOS:  11/09/2018 Type of visit - description:  Virtual Visit via Telephone Note  I connected with@ on 11/10/18 at  2:00 PM EDT by telephone and verified that I am speaking with the correct person using two identifiers.  THIS ENCOUNTER IS A VIRTUAL VISIT DUE TO COVID-19 - PATIENT WAS NOT SEEN IN THE OFFICE. PATIENT HAS CONSENTED TO VIRTUAL VISIT / TELEMEDICINE VISIT   Location of patient: home  Location of provider: office  I discussed the limitations, risks, security and privacy concerns of performing an evaluation and management service by telephone and the availability of in person appointments. I also discussed with the patient that there may be a patient responsible charge related to this service. The patient expressed understanding and agreed to proceed.   History of Present Illness: The patient reports she is doing well.  Has no concerns We went over her medications. We review her labs.   Review of Systems Denies fever chills No cough, SOB at baseline. She lives with her grandson, they are both taking seriously all the precautions to prevent Covid 19  Past Medical History:  Diagnosis Date  . Abnormal LFTs   . Allergic rhinitis   . Anxiety and depression    son commited suicide 2010  . 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 CAD (coronary artery disease)    s/p CABG  . Carotid artery disease ([place])    Moderate - f/u dopplers needed 11/2012  . 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 Zadie Rhine  . COPD (chronic obstructive pulmonary disease) ([place])   . HTN (hypertension)    intolerant to ACEi (cough)  . Hyperlipidemia   . Osteopenia   . PAD (peripheral artery disease) ([place])    Carotid dz as  below, and at [facility] 11/12:  critical stenosis noted just below the sheath site leading into a totally occluded SFA and a patent deep  femoral artery  . Panic attacks   . Thyroid nodule     Past Surgical History:  Procedure Laterality Date  . CORONARY ANGIOPLASTY WITH STENT PLACEMENT     "1; makes total of 3" (07/05/2012)  . CORONARY ARTERY BYPASS GRAFT N/A 12/15/2013   Procedure: CORONARY ARTERY BYPASS GRAFTING (CABG);  Surgeon: Gaye Pollack, MD;  Location: [place];  Service: Open Heart Surgery;  Laterality: N/A;  Times 3 using left internal mammary artery and endoscopically harvested right saphenous vein   . INTRAOPERATIVE TRANSESOPHAGEAL ECHOCARDIOGRAM N/A 12/15/2013   Procedure: INTRAOPERATIVE TRANSESOPHAGEAL ECHOCARDIOGRAM;  Surgeon: Gaye Pollack, MD;  Location: [facility] OR;  Service: Open Heart Surgery;  Laterality: N/A;  . 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 LEFT HEART CATHETERIZATION WITH CORONARY ANGIOGRAM N/A 07/02/2011   Procedure: LEFT HEART CATHETERIZATION WITH CORONARY ANGIOGRAM;  Surgeon: Sherren Mocha, MD;  Location[facility] CATH LAB;  Service: Cardiovascular;  Laterality: N/A;  . LEFT HEART CATHETERIZATION WITH CORONARY ANGIOGRAM N/A 07/05/2012   Procedure: LEFT HEART CATHETERIZATION WITH CORONARY ANGIOGRAM;  Surgeon: Sherren Mocha, MD;  Location: [facility] CATH LAB;  Service: Cardiovascular;  Laterality: N/A;  . LEFT HEART CATHETERIZATION WITH CORONARY ANGIOGRAM N/A 11/28/2013   Procedure: LEFT HEART CATHETERIZATION WITH CORONARY ANGIOGRAM;  Surgeon: Legrand Como  Ree Kida, MD;  Location: [facility] CATH LAB;  Service: Cardiovascular;  Laterality: N/A;  . PERCUTANEOUS CORONARY INTERVENTION-BALLOON ONLY  07/02/2011   Procedure: PERCUTANEOUS CORONARY INTERVENTION-BALLOON ONLY;  Surgeon: Sherren Mocha, MD;  Location: [facility] CATH LAB;  Service: Cardiovascular;;  . PERCUTANEOUS CORONARY INTERVENTION-BALLOON ONLY  07/05/2012   Procedure: PERCUTANEOUS CORONARY INTERVENTION-BALLOON ONLY;  Surgeon: Sherren Mocha, MD;  Location: [facility] CATH LAB;  Service:  Cardiovascular;;  . VAGINAL HYSTERECTOMY  ~ 1976   no oophorectomy    Social History   Socioeconomic History  . Marital status: Widowed    Spouse name: Not on file  . Number of children: 1  . Years of education: Not on file  . Highest education level: Not on file  Occupational History  . Occupation: Retired-2003  Social Needs  . Financial resource strain: Not on file  . Food insecurity:    Worry: Not on file    Inability: Not on file  . Transportation needs:    Medical: Not on file    Non-medical: Not on file  Tobacco Use  . Smoking status: Former Smoker    Packs/day: 0.50    Years: 54.00    Pack years: 27.00    Types: Cigarettes    Last attempt to quit: 12/15/2013    Years since quitting: 4.9  . Smokeless tobacco: Never Used  . Tobacco comment: quit tobacco 12-2013 after CABG  Substance and Sexual Activity  . Alcohol use: Yes  . Drug use: No  . Sexual activity: Not Currently    Birth control/protection: Surgical  Lifestyle  . Physical activity:    Days per week: Not on file    Minutes per session: Not on file  . Stress: Not on file  Relationships  . Social connections:    Talks on phone: Not on file    Gets together: Not on file    Attends religious service: Not on file    Active member of club or organization: Not on file    Attends meetings of clubs or organizations: Not on file    Relationship status: Not on file  . Intimate partner violence:    Fear of current or ex partner: Not on file    Emotionally abused: Not on file    Physically abused: Not on file    Forced sexual activity: Not on file  Other Topics Concern  . Not on file  Social History Narrative   G son lives w/ her    HC-POA -- sister's daughter       Allergies as of 11/09/2018      Reactions   Ace Inhibitors    cough      Medication List       Accurate as of November 09, 2018 11:59 PM. Always use your most recent med list.        amLODipine 5 MG tablet Commonly known as:  NORVASC  Take 1 tablet (5 mg total) by mouth daily.   aspirin 81 MG EC tablet Take 1 tablet (81 mg total) by mouth daily.   atorvastatin 40 MG tablet Commonly known as:  LIPITOR Take 1 tablet (40 mg total) by mouth daily.   budesonide-formoterol 160-4.5 MCG/ACT inhaler Commonly known as:  SYMBICORT Inhale 2 puffs into the lungs 2 (two) times daily.   clonazePAM 0.5 MG tablet Commonly known as:  KLONOPIN TAKE 1 TABLET BY MOUTH IN THE MORNING AS NEEDED FOR ANXIETY AND 1 & 1/2 (ONE & ONE-HALF) TO 2 TABLETS AT BEDTIME  AS NEEDED FOR SLEEP   Eylea 2 MG/0.05ML Soln Generic drug:  Aflibercept Inject 2 mg into the eye every 5 (five) weeks. Dr. Zadie Rhine   FLUoxetine 20 MG capsule Commonly known as:  PROZAC Take 2 capsules (40 mg total) by mouth daily.   furosemide 40 MG tablet Commonly known as:  LASIX Take 40 mg by mouth daily as needed for edema.   Klor-Con M20 20 MEQ tablet Generic drug:  potassium chloride SA Take 1 tablet by mouth daily as needed. Take it along with lasix   losartan 100 MG tablet Commonly known as:  COZAAR Take 1 tablet by mouth once daily   naproxen sodium 220 MG tablet Commonly known as:  ALEVE Take 220 mg by mouth 2 (two) times daily with a meal.   Vitamin D3 25 MCG (1000 UT) Caps Take 1,000 Units by mouth daily.           Objective:   Physical Exam There were no vitals taken for this visit. This was a phone conference, she sounded well.    Assessment     Assessment   Hyperglycemia, A1c 5.7 HTN Hyperlipidemia Anxiety depression COPD , quit tobacco 2015 DJD CV: --CAD,cath 2012, 2013, CABG 2015 --PAD -   ABIs 2012, 07-2015 : Stable moderate disease left, normal right  --Carotid disease - [place] 04-2015, mod dz, fu 1 year --Central Retinal vein occlusion 02-2015 --Palpable aorta US abdomen 06-2015 no AA   Increase LFTs, elevated alkaline phosphatase , [place] abd neg 06-2015  Thyroid nodule --[place] 2012 stable (since 2009)   PLAN HTN: Ambulatory BPs when  checked in the 130s over 70.  Currently on amlodipine,Losartan.  She decided not to take Lasix or potassium due to lack of edema.  Ideally I would check a BMP today however she is high risk for coronavirus so we agreed to reassess the situation in 3 months. High cholesterol: Good compliance with Lipitor Anxiety depression: Good compliance with medications, symptoms are controlled COPD: On Symbicort, at baseline. Elevated alkaline phosphate: Since the last visit, had a whole body bone scan which was negative.  Will monitor AP levels over time. Next visit: 3 months, via teleconference or face-to-face depending on the coronavirus pandemia situation.   I discussed the assessment and treatment plan with the patient. The patient was provided an opportunity to ask questions and all were answered. The patient agreed with the plan and demonstrated an understanding of the instructions.   The patient was advised to call back or seek an in-person evaluation if the symptoms worsen or if the condition fails to improve as anticipated.  I provided 12 minutes of non-face-to-face time during this encounter.   Kathlene November, MD

## 2018-11-10 NOTE — Assessment & Plan Note (Signed)
HTN: Ambulatory BPs when checked in the 130s over 70.  Currently on amlodipine,Losartan.  She decided not to take Lasix or potassium due to lack of edema.  Ideally I would check a BMP today however she is high risk for coronavirus so we agreed to reassess the situation in 3 months. High cholesterol: Good compliance with Lipitor Anxiety depression: Good compliance with medications, symptoms are controlled COPD: On Symbicort, at baseline. Elevated alkaline phosphate: Since the last visit, had a whole body bone scan which was negative.  Will monitor AP levels over time. Next visit: 3 months, via teleconference or face-to-face depending on the coronavirus pandemia situation.

## 2018-12-31 ENCOUNTER — Telehealth: Payer: Self-pay | Admitting: Cardiovascular Disease

## 2018-12-31 NOTE — Telephone Encounter (Signed)
°  left msg vcml to offer virtual visit w/Dr Burt Knack on 5/27 or 5/29- schedule from recall

## 2019-01-04 ENCOUNTER — Telehealth: Payer: Self-pay

## 2019-01-04 NOTE — Telephone Encounter (Signed)
Attempted to contact pt to obtain consent for visit and also to clarify which visit she would prefer (video or phone).

## 2019-01-05 ENCOUNTER — Other Ambulatory Visit: Payer: Self-pay

## 2019-01-05 ENCOUNTER — Encounter: Payer: Self-pay | Admitting: Cardiovascular Disease

## 2019-01-05 ENCOUNTER — Telehealth: Payer: Self-pay

## 2019-01-05 ENCOUNTER — Telehealth (INDEPENDENT_AMBULATORY_CARE_PROVIDER_SITE_OTHER): Payer: Medicare Other | Admitting: Cardiovascular Disease

## 2019-01-05 VITALS — Ht 62.0 in | Wt 140.0 lb

## 2019-01-05 DIAGNOSIS — I10 Essential (primary) hypertension: Secondary | ICD-10-CM

## 2019-01-05 DIAGNOSIS — I2510 Atherosclerotic heart disease of native coronary artery without angina pectoris: Secondary | ICD-10-CM | POA: Diagnosis not present

## 2019-01-05 DIAGNOSIS — I739 Peripheral vascular disease, unspecified: Secondary | ICD-10-CM

## 2019-01-05 NOTE — Progress Notes (Signed)
Virtual Visit via Telephone Note   This visit type was conducted due to national recommendations for restrictions regarding the COVID-19 Pandemic (e.g. social distancing) in an effort to limit this patient's exposure and mitigate transmission in our community.  Due to her co-morbid illnesses, this patient is at least at moderate risk for complications without adequate follow up.  This format is felt to be most appropriate for this patient at this time.  The patient did not have access to video technology/had technical difficulties with video requiring transitioning to audio format only (telephone).  All issues noted in this document were discussed and addressed.  No physical exam could be performed with this format.  Please refer to the patient's chart for her  consent to telehealth for Patricia [name].   Date:  01/05/2019   ID:  Patricia [name], DOB [birth year], MRN [id number]  Patient Location: Home Provider Location: Home  PCP:  Colon Branch, MD  Cardiologist:  Sherren Mocha, MD  Electrophysiologist:  None   Evaluation Performed:  Follow-Up Visit  Chief Complaint:  Shortness of breath  History of Present Illness:    Patricia [name] is a 78 y.o. female with history of extensive CAD and multiple PCI procedures in the past, presenting for follow-up evaluation today.  This evaluation is done via telephone technology in light of the current COVID-19 pandemic.  The patient ultimately was treated with multivessel CABG in 2015 as she was severely limited by refractory angina on maximal medical therapy.  She did remarkably well with CABG and has had no anginal symptoms since her heart surgery.  She feels like she is doing quite well at present.  She does complain of weakness in her legs with any significant activity.  She does not have any cardiac-related symptoms and specifically denies chest pain or pressure, heart palpitations, edema, orthopnea, or PND.  She has mild shortness of breath with  activity and this is unchanged over many years.  The patient was a longtime smoker until she quit in 2015 at the time of her heart surgery.  The patient does not have symptoms concerning for COVID-19 infection (fever, chills, cough, or new shortness of breath).    Past Medical History:  Diagnosis Date  . Abnormal LFTs   . Allergic rhinitis   . Anxiety and depression    son commited suicide 2010  . 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 CAD (coronary artery disease)    s/p CABG  . Carotid artery disease ([place])    Moderate - f/u dopplers needed 11/2012  . 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 Zadie Rhine  . COPD (chronic obstructive pulmonary disease) ([place])   . HTN (hypertension)    intolerant to ACEi (cough)  . Hyperlipidemia   . Osteopenia   . PAD (peripheral artery disease) ([place])    Carotid dz as  below, and at [facility] 11/12:  critical stenosis noted just below the sheath site leading into a totally occluded SFA and a patent deep femoral artery  . Panic attacks   . Thyroid nodule    Past Surgical History:  Procedure Laterality Date  . CORONARY ANGIOPLASTY WITH STENT PLACEMENT     "1; makes total of 3" (07/05/2012)  . CORONARY ARTERY BYPASS GRAFT N/A 12/15/2013   Procedure: CORONARY ARTERY BYPASS GRAFTING (CABG);  Surgeon: Gaye Pollack, MD;  Location: [place];  Service: Open Heart Surgery;  Laterality:  N/A;  Times 3 using left internal mammary artery and endoscopically harvested right saphenous vein   . INTRAOPERATIVE TRANSESOPHAGEAL ECHOCARDIOGRAM N/A 12/15/2013   Procedure: INTRAOPERATIVE TRANSESOPHAGEAL ECHOCARDIOGRAM;  Surgeon: Gaye Pollack, MD;  Location: [facility] OR;  Service: Open Heart Surgery;  Laterality: N/A;  . 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 LEFT HEART CATHETERIZATION WITH CORONARY ANGIOGRAM N/A 07/02/2011   Procedure: LEFT  HEART CATHETERIZATION WITH CORONARY ANGIOGRAM;  Surgeon: Sherren Mocha, MD;  Location: [name] C[facility];  Service: Cardiovascular;  Laterality: N/A;  . LEFT HEART CATHETERIZATION WITH CORONARY ANGIOGRAM N/A 07/05/2012   Procedure: LEFT HEART CATHETERIZATION WITH CORONARY ANGIOGRAM;  Surgeon: Sherren Mocha, MD;  Location: [facility] CATH LAB;  Service: Cardiovascular;  Laterality: N/A;  . LEFT HEART CATHETERIZATION WITH CORONARY ANGIOGRAM N/A 11/28/2013   Procedure: LEFT HEART CATHETERIZATION WITH CORONARY ANGIOGRAM;  Surgeon: Blane Ohara, MD;  Location: [facility] CATH LAB;  Service: Cardiovascular;  Laterality: N/A;  . PERCUTANEOUS CORONARY INTERVENTION-BALLOON ONLY  07/02/2011   Procedure: PERCUTANEOUS CORONARY INTERVENTION-BALLOON ONLY;  Surgeon: Sherren Mocha, MD;  Location: [name];  Service: Cardiovascular;;  . PERCUTANEOUS CORONARY INTERVENTION-BALLOON ONLY  07/05/2012   Procedure: PERCUTANEOUS CORONARY INTERVENTION-BALLOON ONLY;  Surgeon: Sherren Mocha, MD;  Location: [name] CATH LAB;  Service: Cardiovascular;;  . VAGINAL HYSTERECTOMY  ~ 1976   no oophorectomy     Current Meds  Medication Sig  . Acetaminophen (TYLENOL PO) Take by mouth as directed.  Marland Kitchen amLODipine (NORVASC) 5 MG tablet Take 1 tablet (5 mg total) by mouth daily.  Marland Kitchen aspirin 81 MG EC tablet Take 1 tablet (81 mg total) by mouth daily.  Marland Kitchen atorvastatin (LIPITOR) 40 MG tablet Take 1 tablet (40 mg total) by mouth daily.  . budesonide-formoterol (SYMBICORT) 160-4.5 MCG/ACT inhaler Inhale 2 puffs into the lungs 2 (two) times daily. (Patient taking differently: Inhale 2 puffs into the lungs as directed. )  . Cholecalciferol (VITAMIN D3) 1000 UNITS CAPS Take 1,000 Units by mouth daily.  . clonazePAM (KLONOPIN) 0.5 MG tablet TAKE 1 TABLET BY MOUTH IN THE MORNING AS NEEDED FOR ANXIETY AND 1 & 1/2 (ONE & ONE-HALF) TO 2 TABLETS AT BEDTIME AS NEEDED FOR SLEEP  . FLUoxetine (PROZAC) 20 MG capsule Take 2 capsules (40 mg total) by mouth daily.  Marland Kitchen  losartan (COZAAR) 100 MG tablet Take 1 tablet by mouth once daily  . naproxen sodium (ANAPROX) 220 MG tablet Take 220 mg by mouth 2 (two) times daily with a meal.     Allergies:   Ace inhibitors   Social History   Tobacco Use  . Smoking status: Former Smoker    Packs/day: 0.50    Years: 54.00    Pack years: 27.00    Types: Cigarettes    Last attempt to quit: 12/15/2013    Years since quitting: 5.0  . Smokeless tobacco: Never Used  . Tobacco comment: quit tobacco 12-2013 after CABG  Substance Use Topics  . Alcohol use: Yes  . Drug use: No     Family Hx: The patient's family history includes Coronary artery disease in her father; Healthy in her grandchild; Heart Problems (age of onset: 55) in her sister; Heart attack in her father; Heart attack (age of onset: 16) in her brother; Hypertension in her mother; Stroke in her mother; Suicidality (age of onset: 43) in her son. There is no history of Colon cancer, Breast  cancer, Stomach cancer, Rectal cancer, Esophageal cancer, or Liver cancer.  ROS:   Please see the history of present illness.    All other systems reviewed and are negative.   Prior CV studies:   The following studies were reviewed today:  Carotid ultrasound 04/26/2018: Final Interpretation:  Technically challenging study.  Right Carotid: Velocities in the right ICA are consistent with a 1-39% stenosis.                Non-hemodynamically significant plaque <50% noted in the CCA. The                ECA appears >50% stenosed.  Left Carotid: Velocities in the left ICA are consistent with a 40-59% stenosis,               based on peak systolic velocities. Non-hemodynamically significant               plaque noted in the CCA.  Vertebrals:  Bilateral vertebral arteries demonstrate antegrade flow. Subclavians: Normal flow hemodynamics were seen in bilateral subclavian              arteries.  *See table(s) above for measurements and observations. Suggest follow up  study in 12 months.  Labs/Other Tests and Data Reviewed:    EKG:  No ECG reviewed.  Recent Labs: 05/03/2018: ALT 13; BUN 16; Creatinine, Ser 0.58; Hemoglobin 14.5; Platelets 266.0; Potassium 4.3; Sodium 141; TSH 1.45   Recent Lipid Panel Lab Results  Component Value Date/Time   CHOL 134 05/03/2018 04:22 PM   TRIG 89.0 05/03/2018 04:22 PM   HDL 60.00 05/03/2018 04:22 PM   CHOLHDL 2 05/03/2018 04:22 PM   LDLCALC 56 05/03/2018 04:22 PM   LDLDIRECT 156.0 04/26/2007 09:36 AM    Wt Readings from Last 3 Encounters:  01/05/19 140 lb (63.5 kg)  05/03/18 144 lb 8 oz (65.5 kg)  10/01/17 142 lb (64.4 kg)     Objective:    Vital Signs:  Ht 5\' 2"  (1.575 m)   Wt 140 lb (63.5 kg)   BMI 25.61 kg/m    VITAL SIGNS:  reviewed The patient is alert, oriented, in no distress.  She is breathing comfortably in normal conversation.  Remaining exam not performed secondary to virtual/telephone visit type.  ASSESSMENT & PLAN:    1. CAD, native vessel, without angina: The patient will be continued on her current medical regimen.  No changes are recommended today. 2. Hypertension: Blood pressure appears to be well controlled on amlodipine and losartan.  Her cuff is out of batteries and she could not take her blood pressure today. 3. Peripheral arterial disease: Continue clinical follow-up.  No evidence of resting limb ischemia. 4. Carotid stenosis without history of stroke: The patient has moderate carotid stenosis.  I reviewed her most recent carotid duplex from September 2019 outlined above.  She prefers to wait until next year as she does not want to expose herself to potential risk from COVID-19 for any surveillance studies.  Will push her follow-up carotid duplex to 2021. 5. Mixed hyperlipidemia: The patient is treated with atorvastatin.  Her lipids are excellent as outlined above with LDL cholesterol at goal.  COVID-19 Education: The signs and symptoms of COVID-19 were discussed with the patient  and how to seek care for testing (follow up with PCP or arrange E-visit).  The importance of social distancing was discussed today.  Time:   Today, I have spent 15 minutes with the patient with telehealth technology discussing  the above problems.     Medication Adjustments/Labs and Tests Ordered: Current medicines are reviewed at length with the patient today.  Concerns regarding medicines are outlined above.   Tests Ordered: No orders of the defined types were placed in this encounter.   Medication Changes: No orders of the defined types were placed in this encounter.   Disposition:  Follow up in 1 year(s)  Signed, Sherren Mocha, MD  01/05/2019 4:19 PM    [facility]

## 2019-01-05 NOTE — Telephone Encounter (Signed)
Called and got verbal consent for virtual visit with Dr Burt Knack from patient.     Virtual Visit Pre-Appointment Phone Call  "(Name), I am calling you today to discuss your upcoming appointment. We are currently trying to limit exposure to the virus that causes COVID-19 by seeing patients at home rather than in the office."  1. "What is the BEST phone number to call the day of the visit?" - include this in appointment notes  2. "Do you have or have access to (through a family member/friend) a smartphone with video capability that we can use for your visit?" a. If yes - list this number in appt notes as "cell" (if different from BEST phone #) and list the appointment type as a VIDEO visit in appointment notes b. If no - list the appointment type as a PHONE visit in appointment notes  3. Confirm consent - "In the setting of the current Covid19 crisis, you are scheduled for a (phone or video) visit with your provider on (date) at (time).  Just as we do with many in-office visits, in order for you to participate in this visit, we must obtain consent.  If you'd like, I can send this to your mychart (if signed up) or email for you to review.  Otherwise, I can obtain your verbal consent now.  All virtual visits are billed to your insurance company just like a normal visit would be.  By agreeing to a virtual visit, we'd like you to understand that the technology does not allow for your provider to perform an examination, and thus may limit your provider's ability to fully assess your condition. If your provider identifies any concerns that need to be evaluated in person, we will make arrangements to do so.  Finally, though the technology is pretty good, we cannot assure that it will always work on either your or our end, and in the setting of a video visit, we may have to convert it to a phone-only visit.  In either situation, we cannot ensure that we have a secure connection.  Are you willing to proceed?"  STAFF: Did the patient verbally acknowledge consent to telehealth visit? Document YES/NO here: YES  4. Advise patient to be prepared - "Two hours prior to your appointment, go ahead and check your blood pressure, pulse, oxygen saturation, and your weight (if you have the equipment to check those) and write them all down. When your visit starts, your provider will ask you for this information. If you have an Apple Watch or Kardia device, please plan to have heart rate information ready on the day of your appointment. Please have a pen and paper handy nearby the day of the visit as well."  5. Give patient instructions for MyChart download to smartphone OR Doximity/Doxy.me as below if video visit (depending on what platform provider is using)  6. Inform patient they will receive a phone call 15 minutes prior to their appointment time (may be from unknown caller ID) so they should be prepared to answer    TELEPHONE CALL NOTE  Patricia [name] has been deemed a candidate for a follow-up tele-health visit to limit community exposure during the Covid-19 pandemic. I spoke with the patient via phone to ensure availability of phone/video source, confirm preferred email & phone number, and discuss instructions and expectations.  I reminded Patricia [name] to be prepared with any vital sign and/or heart rhythm information that could potentially be obtained via home monitoring, at the time  of her visit. I reminded Patricia [name] to expect a phone call prior to her visit.  Deuce Paternoster, [place] 01/05/2019 3:35 PM   INSTRUCTIONS FOR DOWNLOADING THE MYCHART APP TO SMARTPHONE  - The patient must first make sure to have activated MyChart and know their login information - If Apple, go to [employer] and type in MyChart in the search bar and download the app. If Android, ask patient to go to [employer] and type in [place] in the search bar and download the app. The app is free but as with any other app downloads,  their phone may require them to verify saved payment information or Apple/Android password.  - The patient will need to then log into the app with their MyChart username and password, and select [facility] as their healthcare provider to link the account. When it is time for your visit, go to the MyChart app, find appointments, and click Begin Video Visit. Be sure to Select Allow for your device to access the Microphone and Camera for your visit. You will then be connected, and your provider will be with you shortly.  **If they have any issues connecting, or need assistance please contact MyChart service desk [phone])**  **If using a computer, in order to ensure the best quality for their visit they will need to use either of the following Internet Browsers: [employer], or Google Chrome**  IF USING DOXIMITY or DOXY.ME - The patient will receive a link just prior to their visit by text.     FULL LENGTH CONSENT FOR TELE-HEALTH VISIT   I hereby voluntarily request, consent and authorize [facility] and its employed or contracted physicians, physician assistants, nurse practitioners or other licensed health care professionals (the Practitioner), to provide me with telemedicine health care services (the "Services") as deemed necessary by the treating Practitioner. I acknowledge and consent to receive the Services by the Practitioner via telemedicine. I understand that the telemedicine visit will involve communicating with the Practitioner through live audiovisual communication technology and the disclosure of certain medical information by electronic transmission. I acknowledge that I have been given the opportunity to request an in-person assessment or other available alternative prior to the telemedicine visit and am voluntarily participating in the telemedicine visit.  I understand that I have the right to withhold or withdraw my consent to the use of telemedicine in the  course of my care at any time, without affecting my right to future care or treatment, and that the Practitioner or I may terminate the telemedicine visit at any time. I understand that I have the right to inspect all information obtained and/or recorded in the course of the telemedicine visit and may receive copies of available information for a reasonable fee.  I understand that some of the potential risks of receiving the Services via telemedicine include:  Marland Kitchen Delay or interruption in medical evaluation due to technological equipment failure or disruption; . Information transmitted may not be sufficient (e.g. poor resolution of images) to allow for appropriate medical decision making by the Practitioner; and/or  . In rare instances, security protocols could fail, causing a breach of personal health information.  Furthermore, I acknowledge that it is my responsibility to provide information about my medical history, conditions and care that is complete and accurate to the best of my ability. I acknowledge that Practitioner's advice, recommendations, and/or decision may be based on factors not within their control, such as incomplete or inaccurate data provided by  me or distortions of diagnostic images or specimens that may result from electronic transmissions. I understand that the practice of medicine is not an exact science and that Practitioner makes no warranties or guarantees regarding treatment outcomes. I acknowledge that I will receive a copy of this consent concurrently upon execution via email to the email address I last provided but may also request a printed copy by calling the office of [facility].    I understand that my insurance will be billed for this visit.   I have read or had this consent read to me. . I understand the contents of this consent, which adequately explains the benefits and risks of the Services being provided via telemedicine.  . I have been provided ample  opportunity to ask questions regarding this consent and the Services and have had my questions answered to my satisfaction. . I give my informed consent for the services to be provided through the use of telemedicine in my medical care  By participating in this telemedicine visit I agree to the above.

## 2019-02-08 ENCOUNTER — Other Ambulatory Visit: Payer: Self-pay | Admitting: Cardiovascular Disease

## 2019-02-08 ENCOUNTER — Telehealth: Payer: Self-pay | Admitting: Internal Medicine

## 2019-02-08 NOTE — Telephone Encounter (Signed)
Sent!

## 2019-02-08 NOTE — Telephone Encounter (Signed)
Clonazepam refill.   Last OV: 11/09/2018 Last Fill: 11/03/2018 #60 and 0RF UDS: 05/03/2018 Insufficient quantity

## 2019-03-11 ENCOUNTER — Other Ambulatory Visit: Payer: Self-pay

## 2019-04-25 ENCOUNTER — Other Ambulatory Visit: Payer: Self-pay | Admitting: Internal Medicine

## 2019-04-25 NOTE — Telephone Encounter (Signed)
Rx sent, please  Schedule a office visit at her earliest convenience

## 2019-04-25 NOTE — Telephone Encounter (Signed)
Clonazepam refill.   Last OV: 11/09/2018, no future appt scheduled Last Fill: 02/08/2019 #60 and 0RF UDS: 05/03/2018 insufficient quantity given

## 2019-05-03 ENCOUNTER — Encounter (HOSPITAL_COMMUNITY): Payer: Medicare Other

## 2019-05-31 ENCOUNTER — Other Ambulatory Visit: Payer: Self-pay | Admitting: Internal Medicine

## 2019-05-31 ENCOUNTER — Other Ambulatory Visit: Payer: Self-pay | Admitting: Cardiovascular Disease

## 2019-06-13 ENCOUNTER — Other Ambulatory Visit: Payer: Self-pay | Admitting: Internal Medicine

## 2019-06-13 MED ORDER — CLONAZEPAM 0.5 MG PO TABS: ORAL_TABLET | ORAL | 0 refills | Status: DC

## 2019-06-13 NOTE — Telephone Encounter (Signed)
Unable to send electronically , prescription faxed

## 2019-06-13 NOTE — Telephone Encounter (Signed)
Requested medication (s) are due for refill today: yes  Requested medication (s) are on the active medication list: yes  Last refill:  04/25/2019  Future visit scheduled: no  Notes to clinic:  Refill cannot be delegated   Requested Prescriptions  Pending Prescriptions Disp Refills   clonazePAM (KLONOPIN) 0.5 MG tablet 60 tablet 0    Sig: TAKE 1 TABLET BY MOUTH ONCE DAILY IN THE MORNING AS NEEDED FOR ANXIETY AND 1.5 TO 2 AT BEDTIME AS NEEDED FOR SLEEP     Not Delegated - Psychiatry:  Anxiolytics/Hypnotics Failed - 06/13/2019  1:21 PM      Failed - This refill cannot be delegated      Failed - Urine Drug Screen completed in last 360 days.      Failed - Valid encounter within last 6 months    Recent Outpatient Visits          7 months ago Anxiety,depression, insomnia   [facility] [place], MD   1 year ago Anxiety,depression, insomnia   [facility] [place], MD   1 year ago Anxiety,depression, insomnia   [facility] [place], MD   2 years ago COPD exacerbation [facility])   [facility] [place], MD   2 years ago Essential hypertension   [facility] [place], MD      Future Appointments            In 3 weeks Colon Branch, MD [employer] [facility] [place]

## 2019-06-13 NOTE — Telephone Encounter (Signed)
Medication Refill - Medication: clonazePAM (KLONOPIN) 0.5 MG tablet [id number]      Preferred Pharmacy (with phone number or street name):  [facility] [address] [place], [place] - [address]  [address] [place] [address]  Phone: [phone] Fax: [phone]     Agent: Please be advised that RX refills may take up to 3 business days. We ask that you follow-up with your pharmacy.

## 2019-06-13 NOTE — Telephone Encounter (Signed)
Clonazepam refill.   Last OV: 11/09/2018, appt scheduled 07/06/2019 Last Fill: 04/25/2019 #60 and 0RF UDS: 05/03/2018 insufficient quantity given

## 2019-06-13 NOTE — Telephone Encounter (Signed)
Rx faxed to [facility].

## 2019-06-14 DIAGNOSIS — Z961 Presence of intraocular lens: Secondary | ICD-10-CM | POA: Diagnosis not present

## 2019-06-14 DIAGNOSIS — H348122 Central retinal vein occlusion, left eye, stable: Secondary | ICD-10-CM | POA: Diagnosis not present

## 2019-07-05 ENCOUNTER — Other Ambulatory Visit: Payer: Self-pay

## 2019-07-06 ENCOUNTER — Ambulatory Visit (INDEPENDENT_AMBULATORY_CARE_PROVIDER_SITE_OTHER): Payer: Medicare Other | Admitting: Internal Medicine

## 2019-07-06 ENCOUNTER — Encounter: Payer: Self-pay | Admitting: Internal Medicine

## 2019-07-06 VITALS — BP 158/51 | HR 76 | Temp 97.3°F | Resp 16 | Ht 62.0 in | Wt 135.4 lb

## 2019-07-06 DIAGNOSIS — Z23 Encounter for immunization: Secondary | ICD-10-CM

## 2019-07-06 DIAGNOSIS — F329 Major depressive disorder, single episode, unspecified: Secondary | ICD-10-CM | POA: Diagnosis not present

## 2019-07-06 DIAGNOSIS — F419 Anxiety disorder, unspecified: Secondary | ICD-10-CM

## 2019-07-06 DIAGNOSIS — R739 Hyperglycemia, unspecified: Secondary | ICD-10-CM

## 2019-07-06 DIAGNOSIS — E785 Hyperlipidemia, unspecified: Secondary | ICD-10-CM

## 2019-07-06 DIAGNOSIS — Z79899 Other long term (current) drug therapy: Secondary | ICD-10-CM | POA: Diagnosis not present

## 2019-07-06 DIAGNOSIS — I10 Essential (primary) hypertension: Secondary | ICD-10-CM | POA: Diagnosis not present

## 2019-07-06 DIAGNOSIS — I2510 Atherosclerotic heart disease of native coronary artery without angina pectoris: Secondary | ICD-10-CM

## 2019-07-06 DIAGNOSIS — J449 Chronic obstructive pulmonary disease, unspecified: Secondary | ICD-10-CM

## 2019-07-06 LAB — COMPREHENSIVE METABOLIC PANEL
ALT: 11 U/L (ref 0–35)
AST: 15 U/L (ref 0–37)
Albumin: 3.7 g/dL (ref 3.5–5.2)
Alkaline Phosphatase: 163 U/L — ABNORMAL HIGH (ref 39–117)
BUN: 12 mg/dL (ref 6–23)
CO2: 31 mEq/L (ref 19–32)
Calcium: 9.7 mg/dL (ref 8.4–10.5)
Chloride: 103 mEq/L (ref 96–112)
Creatinine, Ser: 0.53 mg/dL (ref 0.40–1.20)
GFR: 111.18 mL/min (ref >60.00)
Glucose, Bld: 99 mg/dL (ref 70–99)
Potassium: 3.8 mEq/L (ref 3.5–5.1)
Sodium: 141 mEq/L (ref 135–145)
Total Bilirubin: 0.5 mg/dL (ref 0.2–1.2)
Total Protein: 6.3 g/dL (ref 6.0–8.3)

## 2019-07-06 LAB — CBC WITH DIFFERENTIAL/PLATELET
Basophils Absolute: 0 10*3/uL (ref 0.0–0.1)
Basophils Relative: 0.5 % (ref 0.0–3.0)
Eosinophils Absolute: 0.2 10*3/uL (ref 0.0–0.7)
Eosinophils Relative: 2.4 % (ref 0.0–5.0)
HCT: 42.9 % (ref 36.0–46.0)
Hemoglobin: 14.1 g/dL (ref 12.0–15.0)
Lymphocytes Relative: 18.1 % (ref 12.0–46.0)
Lymphs Abs: 1.2 10*3/uL (ref 0.7–4.0)
MCHC: 33 g/dL (ref 30.0–36.0)
MCV: 88.4 fl (ref 78.0–100.0)
Monocytes Absolute: 0.5 10*3/uL (ref 0.1–1.0)
Monocytes Relative: 7.7 % (ref 3.0–12.0)
Neutro Abs: 4.5 10*3/uL (ref 1.4–7.7)
Neutrophils Relative %: 71.3 % (ref 43.0–77.0)
Platelets: 225 10*3/uL (ref 150.0–400.0)
RBC: 4.85 Mil/uL (ref 3.87–5.11)
RDW: 14.1 % (ref 11.5–15.5)
WBC: 6.4 10*3/uL (ref 4.0–10.5)

## 2019-07-06 LAB — LIPID PANEL
Cholesterol: 136 mg/dL (ref 0–200)
HDL: 57.6 mg/dL (ref >39.00)
LDL Cholesterol: 65 mg/dL (ref 0–99)
NonHDL: 78.7
Total CHOL/HDL Ratio: 2
Triglycerides: 67 mg/dL (ref 0.0–149.0)
VLDL: 13.4 mg/dL (ref 0.0–40.0)

## 2019-07-06 LAB — HEMOGLOBIN A1C: Hgb A1c MFr Bld: 5.4 % (ref 4.6–6.5)

## 2019-07-06 NOTE — Progress Notes (Signed)
Pre visit review using our clinic review tool, if applicable. No additional management support is needed unless otherwise documented below in the visit note. 

## 2019-07-06 NOTE — Progress Notes (Signed)
Subjective:    Patient ID: Patricia [name], female    DOB: 1940/10/02, 78 y.o.   MRN: [id number]  DOS:  07/06/2019 Type of visit - description: Routine office visit In general feels well, she admits she is frustrated by the pandemia and quarantine. HTN: No ambulatory BPs recently. High cholesterol: Due for labs COPD: Barely symptomatic.  Uses medications as needed  Review of Systems  Denies chest pain or difficulty breathing No edema Hardly ever has cough with minimal sputum production. No nausea, vomiting, diarrhea.  No blood in the stools.  Past Medical History:  Diagnosis Date  . Abnormal LFTs   . Allergic rhinitis   . Anxiety and depression    son commited suicide 2010  . 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 CAD (coronary artery disease)    s/p CABG  . Carotid artery disease ([place])    Moderate - f/u dopplers needed 11/2012  . 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 Zadie Rhine  . COPD (chronic obstructive pulmonary disease) ([place])   . HTN (hypertension)    intolerant to ACEi (cough)  . Hyperlipidemia   . Osteopenia   . PAD (peripheral artery disease) ([place])    Carotid dz as  below, and at [facility] 11/12:  critical stenosis noted just below the sheath site leading into a totally occluded SFA and a patent deep femoral artery  . Panic attacks   . Thyroid nodule     Past Surgical History:  Procedure Laterality Date  . CORONARY ANGIOPLASTY WITH STENT PLACEMENT     "1; makes total of 3" (07/05/2012)  . CORONARY ARTERY BYPASS GRAFT N/A 12/15/2013   Procedure: CORONARY ARTERY BYPASS GRAFTING (CABG);  Surgeon: Gaye Pollack, MD;  Location: [place];  Service: Open Heart Surgery;  Laterality: N/A;  Times 3 using left internal mammary artery and endoscopically harvested right saphenous vein   . INTRAOPERATIVE TRANSESOPHAGEAL ECHOCARDIOGRAM N/A 12/15/2013   Procedure: INTRAOPERATIVE TRANSESOPHAGEAL ECHOCARDIOGRAM;  Surgeon: Gaye Pollack,  MD;  Location: [facility] OR;  Service: Open Heart Surgery;  Laterality: N/A;  . 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 LEFT HEART CATHETERIZATION WITH CORONARY ANGIOGRAM N/A 07/02/2011   Procedure: LEFT HEART CATHETERIZATION WITH CORONARY ANGIOGRAM;  Surgeon: Sherren Mocha, MD;  Location: [facility] CATH LAB;  Service: Cardiovascular;  Laterality: N/A;  . LEFT HEART CATHETERIZATION WITH CORONARY ANGIOGRAM N/A 07/05/2012   Procedure: LEFT HEART CATHETERIZATION WITH CORONARY ANGIOGRAM;  Surgeon: Sherren Mocha, MD;  Location: [facility] CATH LAB;  Service: Cardiovascular;  Laterality: N/A;  . LEFT HEART CATHETERIZATION WITH CORONARY ANGIOGRAM N/A 11/28/2013   Procedure: LEFT HEART CATHETERIZATION WITH CORONARY ANGIOGRAM;  Surgeon: Blane Ohara, MD;  Location: [facility] CATH LAB;  Service: Cardiovascular;  Laterality: N/A;  . PERCUTANEOUS CORONARY INTERVENTION-BALLOON ONLY  07/02/2011   Procedure: PERCUTANEOUS CORONARY INTERVENTION-BALLOON ONLY;  Surgeon: Sherren Mocha, MD;  Location: [facility] CATH LAB;  Service: Cardiovascular;;  . PERCUTANEOUS CORONARY INTERVENTION-BALLOON ONLY  07/05/2012   Procedure: PERCUTANEOUS CORONARY INTERVENTION-BALLOON ONLY;  Surgeon: Sherren Mocha, MD;  Location: [facility] CATH LAB;  Service: Cardiovascular;;  . VAGINAL HYSTERECTOMY  ~ 1976   no oophorectomy    Social History   Socioeconomic History  . Marital status: Widowed    Spouse name: Not on file  . Number of children: 1  . Years  of education: Not on file  . Highest education level: Not on file  Occupational History  . Occupation: Retired-2003  Social Needs  . Financial resource strain: Not on file  . Food insecurity    Worry: Not on file    Inability: Not on file  . Transportation needs    Medical: Not on file    Non-medical: Not on file  Tobacco Use  . Smoking status: Former Smoker    Packs/day: 0.50    Years: 54.00    Pack years:  27.00    Types: Cigarettes    Quit date: 12/15/2013    Years since quitting: 5.5  . Smokeless tobacco: Never Used  . Tobacco comment: quit tobacco 12-2013 after CABG  Substance and Sexual Activity  . Alcohol use: Yes  . Drug use: No  . Sexual activity: Not Currently    Birth control/protection: Surgical  Lifestyle  . Physical activity    Days per week: Not on file    Minutes per session: Not on file  . Stress: Not on file  Relationships  . Social Herbalist on phone: Not on file    Gets together: Not on file    Attends religious service: Not on file    Active member of club or organization: Not on file    Attends meetings of clubs or organizations: Not on file    Relationship status: Not on file  . Intimate partner violence    Fear of current or ex partner: Not on file    Emotionally abused: Not on file    Physically abused: Not on file    Forced sexual activity: Not on file  Other Topics Concern  . Not on file  Social History Narrative   G-son his wife/son live w/ her    HC-POA -- sister's daughter       Allergies as of 07/06/2019      Reactions   Ace Inhibitors    cough      Medication List       Accurate as of July 06, 2019 11:59 PM. If you have any questions, ask your nurse or doctor.        STOP taking these medications   Eylea 2 MG/0.05ML Soln Generic drug: Aflibercept Stopped by: Kathlene November, MD     TAKE these medications   amLODipine 5 MG tablet Commonly known as: NORVASC Take 1 tablet (5 mg total) by mouth daily.   aspirin 81 MG EC tablet Take 1 tablet (81 mg total) by mouth daily.   atorvastatin 40 MG tablet Commonly known as: LIPITOR Take 1 tablet (40 mg total) by mouth daily.   budesonide-formoterol 160-4.5 MCG/ACT inhaler Commonly known as: SYMBICORT Inhale 2 puffs into the lungs 2 (two) times daily. What changed: when to take this   clonazePAM 0.5 MG tablet Commonly known as: KLONOPIN TAKE 1 TABLET BY MOUTH ONCE DAILY IN  THE MORNING AS NEEDED FOR ANXIETY AND 1.5 TO 2 AT BEDTIME AS NEEDED FOR SLEEP   FLUoxetine 20 MG capsule Commonly known as: PROZAC Take 2 capsules (40 mg total) by mouth daily.   losartan 100 MG tablet Commonly known as: COZAAR Take 1 tablet by mouth once daily   naproxen sodium 220 MG tablet Commonly known as: ALEVE Take 220 mg by mouth 2 (two) times daily with a meal.   TYLENOL PO Take by mouth as directed.   Vitamin D3 25 MCG (1000 UT) Caps Take 1,000 Units by  mouth daily.           Objective:   Physical Exam BP (!) 158/51 (BP Location: Left Arm, Cuff Size: Normal)   Pulse 76   Temp (!) 97.3 F (36.3 C) (Temporal)   Resp 16   Ht 5\' 2"  (1.575 m)   Wt 135 lb 6 oz (61.4 kg)   SpO2 95%   BMI 24.76 kg/m  General:   Well developed, NAD, BMI noted.  HEENT:  Normocephalic . Face symmetric, atraumatic Neck: Right-sided angle nodule noted, measures approximately 3 cm in diameter, not tender Lungs:  CTA B Normal respiratory effort, no intercostal retractions, no accessory muscle use. Heart: RRR,  no murmur.  no pretibial edema bilaterally  Abdomen:  Not distended, soft, non-tender. No rebound or rigidity.   Skin: Not pale. Not jaundice Neurologic:  alert & oriented X3.  Speech normal, gait appropriate for age and unassisted Psych--  Cognition and judgment appear intact.  Cooperative with normal attention span and concentration.  Behavior appropriate. No anxious or depressed appearing.     Assessment      Assessment   Hyperglycemia, A1c 5.7 HTN Hyperlipidemia Anxiety depression COPD , quit tobacco 2015 DJD CV: --CAD,cath 2012, 2013, CABG 2015 --PAD -   ABIs 2012, 07-2015 : Stable moderate disease left, normal right  --Carotid disease - [place] 04-2015, mod dz, fu 1 year --Central Retinal vein occlusion 02-2015 --Palpable aorta US abdomen 06-2015 no AA   Increase LFTs, elevated alkaline phosphatase , [place] abd neg 06-2015  Thyroid nodule --[place] 2012 stable (since  2009)   PLAN Preventive care reviewed Hyperglycemia: Diet controlled, checking A1c. HTN: Currently on amlodipine, losartan.  Checking BMP and CBC.  BP today is slt elevated, checked twice.  No change for now but recommend to monitor at home and call me in 4 weeks. High cholesterol: Continue Lipitor, checking labs Anxiety depression: Controlled on duloxetine at clonazepam.  Check a UDS COPD: Uses inhalers as needed, essentially no symptoms Vascular, CAD. PVD: Seems a stable Thyroid nodule: Slightly larger than before?  The patient does not like any further testing at this point. RTC 6 months   This visit occurred during the SARS-CoV-2 public health emergency.  Safety protocols were in place, including screening questions prior to the visit, additional usage of staff PPE, and extensive cleaning of exam room while observing appropriate contact time as indicated for disinfecting solutions.

## 2019-07-06 NOTE — Patient Instructions (Addendum)
Please schedule Medicare Wellness with Glenard Haring.   GO TO THE LAB : Get the blood work     GO TO THE FRONT DESK Schedule your next appointment   for a checkup in 6 months     Check the  blood pressure 2 or 3 times a month  BP GOAL is between 110/65 and  135/85. Call in 4 weeks

## 2019-07-06 NOTE — Assessment & Plan Note (Addendum)
Td 2018 - pnm 23: 2011 - pnm 13: 2015  -shingrex discussed , not interested  -   flu shottoday - Declined DEXA No further breast-cervical-colon ca screening

## 2019-07-07 LAB — PAIN MGMT, PROFILE 8 W/CONF, U
6 Acetylmorphine: NEGATIVE ng/mL
Alcohol Metabolites: NEGATIVE ng/mL (ref <500)
Amphetamines: NEGATIVE ng/mL
Benzodiazepines: NEGATIVE ng/mL
Buprenorphine, Urine: NEGATIVE ng/mL
Cocaine Metabolite: NEGATIVE ng/mL
Creatinine: 14.9 mg/dL
MDMA: NEGATIVE ng/mL
Marijuana Metabolite: NEGATIVE ng/mL
Opiates: NEGATIVE ng/mL
Oxidant: NEGATIVE ug/mL
Oxycodone: NEGATIVE ng/mL
Specific Gravity: 1.002 — ABNORMAL LOW (ref >=1.0)
pH: 6.4 (ref 4.5–9.0)

## 2019-07-08 NOTE — Assessment & Plan Note (Signed)
Preventive care reviewed Hyperglycemia: Diet controlled, checking A1c. HTN: Currently on amlodipine, losartan.  Checking BMP and CBC.  BP today is slt elevated, checked twice.  No change for now but recommend to monitor at home and call me in 4 weeks. High cholesterol: Continue Lipitor, checking labs Anxiety depression: Controlled on duloxetine at clonazepam.  Check a UDS COPD: Uses inhalers as needed, essentially no symptoms Vascular, CAD. PVD: Seems a stable Thyroid nodule: Slightly larger than before?  The patient does not like any further testing at this point. RTC 6 months

## 2019-07-10 ENCOUNTER — Other Ambulatory Visit: Payer: Self-pay | Admitting: Internal Medicine

## 2019-08-01 ENCOUNTER — Telehealth: Payer: Self-pay | Admitting: Internal Medicine

## 2019-08-01 NOTE — Telephone Encounter (Signed)
Clonazepam refill.   Last OV: 07/06/2019 Last Fill: 06/13/2019 #60 and 0RF UDS: 07/06/2019 Low risk

## 2019-08-01 NOTE — Telephone Encounter (Signed)
Sent!

## 2019-08-21 ENCOUNTER — Encounter: Payer: Self-pay | Admitting: Internal Medicine

## 2019-08-22 ENCOUNTER — Emergency Department (HOSPITAL_BASED_OUTPATIENT_CLINIC_OR_DEPARTMENT_OTHER)
Admission: EM | Admit: 2019-08-22 | Discharge: 2019-08-22 | Disposition: A | Discharge status: Home / Self Care | Payer: Medicare Other | Attending: Emergency Medicine | Admitting: Emergency Medicine

## 2019-08-22 ENCOUNTER — Other Ambulatory Visit: Payer: Self-pay

## 2019-08-22 ENCOUNTER — Emergency Department (HOSPITAL_BASED_OUTPATIENT_CLINIC_OR_DEPARTMENT_OTHER): Payer: Medicare Other

## 2019-08-22 ENCOUNTER — Encounter (HOSPITAL_BASED_OUTPATIENT_CLINIC_OR_DEPARTMENT_OTHER): Payer: Self-pay | Admitting: *Deleted

## 2019-08-22 ENCOUNTER — Other Ambulatory Visit: Payer: Self-pay | Admitting: Internal Medicine

## 2019-08-22 DIAGNOSIS — Z87891 Personal history of nicotine dependence: Secondary | ICD-10-CM | POA: Diagnosis not present

## 2019-08-22 DIAGNOSIS — Z7982 Long term (current) use of aspirin: Secondary | ICD-10-CM | POA: Insufficient documentation

## 2019-08-22 DIAGNOSIS — M79671 Pain in right foot: Secondary | ICD-10-CM | POA: Diagnosis not present

## 2019-08-22 DIAGNOSIS — M79672 Pain in left foot: Secondary | ICD-10-CM | POA: Insufficient documentation

## 2019-08-22 DIAGNOSIS — J449 Chronic obstructive pulmonary disease, unspecified: Secondary | ICD-10-CM | POA: Diagnosis not present

## 2019-08-22 DIAGNOSIS — Z79899 Other long term (current) drug therapy: Secondary | ICD-10-CM | POA: Insufficient documentation

## 2019-08-22 DIAGNOSIS — Z951 Presence of aortocoronary bypass graft: Secondary | ICD-10-CM | POA: Insufficient documentation

## 2019-08-22 DIAGNOSIS — I2510 Atherosclerotic heart disease of native coronary artery without angina pectoris: Secondary | ICD-10-CM | POA: Diagnosis not present

## 2019-08-22 DIAGNOSIS — I10 Essential (primary) hypertension: Secondary | ICD-10-CM | POA: Diagnosis not present

## 2019-08-22 DIAGNOSIS — M25572 Pain in left ankle and joints of left foot: Secondary | ICD-10-CM | POA: Diagnosis not present

## 2019-08-22 MED ORDER — AMLODIPINE BESYLATE 10 MG PO TABS: 10.0000 mg | ORAL_TABLET | Freq: Every day | ORAL | 1 refills | Status: DC

## 2019-08-22 NOTE — Discharge Instructions (Addendum)
You were seen in the ER for left ankle pain  X-ray is normal  Cause of your pain is unclear but no signs of infection, fracture, blood clot  Alternate tylenol [id number] mg acetaminophen as needed  Call podiatrist to discuss your ongoing pain  Return for joint swelling, redness, warmth, calf pain or swelling, feveres

## 2019-08-22 NOTE — ED Triage Notes (Signed)
Left foot and ankle pain. The pain woke her at 5am. No injury, no swelling, no redness. She is ambulatory with her walker.

## 2019-08-22 NOTE — ED Provider Notes (Signed)
[facility] EMERGENCY DEPARTMENT Provider Note   CSN: [id number] Arrival date & time: 08/22/19  1327     History Chief Complaint  Patient presents with   Foot Pain   Ankle Pain    Patricia [name] is a 79 y.o. female presents to ER for evaluation of left ankle pain. This woke her up at 5 am while she was asleep. It was moderate to severe. It has since significantly improved.  Denies associated joint redness, warmth, injury.  No calf tenderness, swelling, redness.  No recent changes in shoes. No recent increased activity or walking. No trauma or falls.  She ambulates with her walker. States she has actually had ankle and feet pain for several years affecting right and left but usually worse on her left.  Has been seen by a doctor for this but cannot remember what they told her.  No history of diabetes.   HPI     Past Medical History:  Diagnosis Date   Abnormal LFTs    Allergic rhinitis    Anxiety and depression    son commited suicide 2010   Arthritis    In her thumb   CAD (coronary artery disease)    s/p CABG   Carotid artery disease ([facility])    Moderate - f/u dopplers needed 11/2012   Central retinal vein occlusion of left eye 02/2015   Receiving injections in eye every 5 weeks by Dr. Zadie Rhine   COPD (chronic obstructive pulmonary disease) ([place])    HTN (hypertension)    intolerant to ACEi (cough)   Hyperlipidemia    Osteopenia    PAD (peripheral artery disease) ([facility])    Carotid dz as  below, and at [facility] 11/12:  critical stenosis noted just below the sheath site leading into a totally occluded SFA and a patent deep femoral artery   Panic attacks    Thyroid nodule     Patient Active Problem List   Diagnosis Date Noted   CAP (community acquired pneumonia) 05/15/2016   Right shoulder pain 12/26/2015   PCP NOTES >>>>>>>>>>>>>>> 12/13/2015   Retinal vein occlusion--Left dx 02-2015 03/20/2015   Recurrent UTI 06/27/2014   Hyperglycemia 01/25/2014     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   Annual physical exam 08/08/2011   PAD (peripheral artery disease) ([place]) 07/21/2011   OTHER DYSPNEA AND RESPIRATORY ABNORMALITIES 07/02/2010   Coronary atherosclerosis of native coronary artery 07/02/2010   Allergic rhinitis 10/16/2009   Essential hypertension 07/20/2009   Anxiety,depression, insomnia 05/18/2009   SKIN LESION 01/12/2008   THYROID [place] ultrasound 2012, stable since 2009 04/26/2007   H/O tobacco use, presenting hazards to health 04/26/2007   Hyperlipidemia 01/25/2007   Carotid artery disease ([place]) 01/25/2007   COPD (chronic obstructive pulmonary disease) ([place]) 01/25/2007   OSTEOPENIA 01/25/2007    Past Surgical History:  Procedure Laterality Date   CORONARY ANGIOPLASTY WITH STENT PLACEMENT     "1; makes total of 3" (07/05/2012)   CORONARY ARTERY BYPASS GRAFT N/A 12/15/2013   Procedure: CORONARY ARTERY BYPASS GRAFTING (CABG);  Surgeon: Gaye Pollack, MD;  Location: [place];  Service: Open Heart Surgery;  Laterality: N/A;  Times 3 using left internal mammary artery and endoscopically harvested right saphenous vein    INTRAOPERATIVE TRANSESOPHAGEAL ECHOCARDIOGRAM N/A 12/15/2013   Procedure: INTRAOPERATIVE TRANSESOPHAGEAL ECHOCARDIOGRAM;  Surgeon: Gaye Pollack, MD;  Location: [facility] OR;  Service: Open Heart Surgery;  Laterality: N/A;   KIDNEY SURGERY  1960's   "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;  called it floating; had OR to put it where it belongs" (07/05/2012)   LEFT HEART CATHETERIZATION WITH CORONARY ANGIOGRAM N/A 07/02/2011   Procedure: LEFT HEART CATHETERIZATION WITH CORONARY ANGIOGRAM;  Surgeon: Sherren Mocha, MD;  Location: [facility] CATH LAB;  Service: Cardiovascular;  Laterality: N/A;   LEFT HEART CATHETERIZATION WITH CORONARY ANGIOGRAM N/A 07/05/2012   Procedure: LEFT HEART CATHETERIZATION WITH CORONARY ANGIOGRAM;  Surgeon: Sherren Mocha, MD;  Location: [facility] CATH LAB;  Service: Cardiovascular;  Laterality: N/A;    LEFT HEART CATHETERIZATION WITH CORONARY ANGIOGRAM N/A 11/28/2013   Procedure: LEFT HEART CATHETERIZATION WITH CORONARY ANGIOGRAM;  Surgeon: Blane Ohara, MD;  Location: [facility] CATH LAB;  Service: Cardiovascular;  Laterality: N/A;   PERCUTANEOUS CORONARY INTERVENTION-BALLOON ONLY  07/02/2011   Procedure: PERCUTANEOUS CORONARY INTERVENTION-BALLOON ONLY;  Surgeon: Sherren Mocha, MD;  Location: [facility] CATH LAB;  Service: Cardiovascular;;   PERCUTANEOUS CORONARY INTERVENTION-BALLOON ONLY  07/05/2012   Procedure: PERCUTANEOUS CORONARY INTERVENTION-BALLOON ONLY;  Surgeon: Sherren Mocha, MD;  Location: [facility] CATH LAB;  Service: Cardiovascular;;   VAGINAL HYSTERECTOMY  ~ 1976   no oophorectomy     OB History   No obstetric history on file.     Family History  Problem Relation Age of Onset   Hypertension Mother    Stroke Mother    Coronary artery disease Father        CAD in female relative <50   Heart attack Father    Heart attack Brother 55   Heart Problems Sister 53       perminant pacemaker   Suicidality Son 44       2010   Healthy Grandchild    Colon cancer Neg Hx    Breast cancer Neg Hx    Stomach cancer Neg Hx    Rectal cancer Neg Hx    Esophageal cancer Neg Hx    Liver cancer Neg Hx     Social History   Tobacco Use   Smoking status: Former Smoker    Packs/day: 0.50    Years: 54.00    Pack years: 27.00    Types: Cigarettes    Quit date: 12/15/2013    Years since quitting: 5.6   Smokeless tobacco: Never Used   Tobacco comment: quit tobacco 12-2013 after CABG  Substance Use Topics   Alcohol use: Yes   Drug use: No    Home Medications Prior to Admission medications   Medication Sig Start Date End Date Taking? Authorizing Provider  Acetaminophen (TYLENOL PO) Take by mouth as directed.    [provider]  amLODipine (NORVASC) 10 MG tablet Take 1 tablet (10 mg total) by mouth daily. 08/22/19   Colon Branch, MD  aspirin 81 MG EC tablet Take 1 tablet  (81 mg total) by mouth daily. 10/22/15   Sherren Mocha, MD  atorvastatin (LIPITOR) 40 MG tablet Take 1 tablet (40 mg total) by mouth daily. 07/11/19   Colon Branch, MD  budesonide-formoterol [facility]) 160-4.5 MCG/ACT inhaler Inhale 2 puffs into the lungs 2 (two) times daily. Patient taking differently: Inhale 2 puffs into the lungs as directed.  06/04/17   Colon Branch, MD  Cholecalciferol (VITAMIN D3) 1000 UNITS CAPS Take 1,000 Units by mouth daily.    [provider]  clonazePAM (KLONOPIN) 0.5 MG tablet TAKE 1 TABLET BY MOUTH IN THE MORNING AS NEEDED FOR ANXIETY AND 1 & 1/2 TO 2 (ONE & ONE-HALF TO TWO) AT BEDTIME AS NEEDED FOR SLEEP 08/01/19   Colon Branch, MD  FLUoxetine (PROZAC) 20 MG capsule Take 2 capsules (40 mg total) by mouth daily. 08/01/19   Colon Branch, MD  losartan (COZAAR) 100 MG tablet Take 1 tablet by mouth once daily 05/31/19   Sherren Mocha, MD  naproxen sodium (ANAPROX) 220 MG tablet Take 220 mg by mouth 2 (two) times daily with a meal.    [provider]    Allergies    Ace inhibitors  Review of Systems   Review of Systems  Musculoskeletal: Positive for arthralgias.  All other systems reviewed and are negative.   Physical Exam Updated Vital Signs BP (!) 157/66 (BP Location: Right Arm)    Pulse 92    Temp 98 F (36.7 C) (Oral)    Resp 20    Ht 5\' 2"  (1.575 m)    Wt 61.2 kg    SpO2 97%    BMI 24.69 kg/m   Physical Exam Vitals and nursing note reviewed.  Constitutional:      General: She is not in acute distress.    Appearance: She is well-developed.     Comments: NAD.  HENT:     Head: Normocephalic and atraumatic.     Right Ear: External ear normal.     Left Ear: External ear normal.     Nose: Nose normal.  Eyes:     General: No scleral icterus.    Conjunctiva/sclera: Conjunctivae normal.  Cardiovascular:     Rate and Rhythm: Normal rate and regular rhythm.     Heart sounds: Normal heart sounds. No murmur.     Comments: 1+ DP pulses  bilaterally. Calves normal without edema, asymmetry tenderness  Pulmonary:     Effort: Pulmonary effort is normal.     Breath sounds: Normal breath sounds. No wheezing.  Musculoskeletal:        General: Tenderness present. No deformity. Normal range of motion.     Cervical back: Normal range of motion and neck supple.     Comments: Mild tenderness inferior lateral malleoli, bilaterally L>R. Ankles/feet have normal skin. No joint erythema, edema, warmth. Full ROM of both ankles without pain.  Patient reports only having pain with weight bearing.  Calves soft, supple, without edema warmth tenderness.   Skin:    General: Skin is warm and dry.     Capillary Refill: Capillary refill takes less than 2 seconds.     Comments: Dry skin in distal pretibial areas, ankles feet but no erythema, warmth, lesions, abscess   Neurological:     Mental Status: She is alert and oriented to person, place, and time.     Comments: Sensation and strength intact in ankles/feet  Psychiatric:        Behavior: Behavior normal.        Thought Content: Thought content normal.        Judgment: Judgment normal.     ED Results / Procedures / Treatments   Labs (all labs ordered are listed, but only abnormal results are displayed) Labs Reviewed - No data to display  EKG None  Radiology DG Ankle Complete Left  Result Date: 08/22/2019 CLINICAL DATA:  Lateral foot and ankle pain.  No injury EXAM: LEFT ANKLE COMPLETE - 3+ VIEW COMPARISON:  None. FINDINGS: There is no evidence of fracture, dislocation, or joint effusion. There is no evidence of arthropathy or other focal bone abnormality. Soft tissues are unremarkable. Mild atherosclerotic calcification. IMPRESSION: Negative. Electronically Signed   By: Franchot Gallo M.D.   On: 08/22/2019 14:08  DG Foot Complete Left  Result Date: 08/22/2019 CLINICAL DATA:  Lateral foot and ankle pain.  No injury EXAM: LEFT FOOT - COMPLETE 3+ VIEW COMPARISON:  None. FINDINGS: There is  no evidence of fracture or dislocation. There is no evidence of arthropathy or other focal bone abnormality. Soft tissues are unremarkable. IMPRESSION: Negative. Electronically Signed   By: Franchot Gallo M.D.   On: 08/22/2019 14:09    Procedures Procedures (including critical care time)  Medications Ordered in ED Medications - No data to display  ED Course  I have reviewed the triage vital signs and the nursing notes.  Pertinent labs & imaging results that were available during my care of the patient were reviewed by me and considered in my medical decision making (see chart for details).    MDM Rules/Calculators/A&P                      79 year old female with acute on chronic ankle/feet pain.  Left worse than right today.  Chart reviewed shows patient has been seen for ankle and foot pain in the past.  Has had x-rays.  No trauma today.  She is ambulatory with walker without antalgic gait.  Extremities are neurovascularly intact.  No signs of superimposed infection like septic arthritis, cellulitis.  No proximal calf tenderness, asymmetry or edema.  She has no history of gout.  She has no history of diabetes.  X-rays obtained of left ankle/foot in triage, personally reviewed and nonacute.  Patient has absolutely no pain with range of motion or palpation of her ankles or feet.  I do not think further emergent imaging or lab work is indicated.  Cause of pain is unclear.  Recommended evaluation by PCP or possibly podiatry.  Recommended supportive care including Tylenol, supportive shoes.  Return precautions discussed.  He is comfortable this plan.  Final Clinical Impression(s) / ED Diagnoses Final diagnoses:  Bilateral foot pain    Rx / DC Orders ED Discharge Orders    None       Kinnie Feil, PA-C 08/22/19 1958    Fredia Sorrow, MD 09/10/19 [phone]

## 2019-08-31 ENCOUNTER — Encounter: Payer: Self-pay | Admitting: Gastroenterology

## 2019-09-06 ENCOUNTER — Encounter (HOSPITAL_BASED_OUTPATIENT_CLINIC_OR_DEPARTMENT_OTHER): Payer: Self-pay | Admitting: Emergency Medicine

## 2019-09-06 ENCOUNTER — Inpatient Hospital Stay (HOSPITAL_BASED_OUTPATIENT_CLINIC_OR_DEPARTMENT_OTHER)
Admission: EM | Admit: 2019-09-06 | Discharge: 2019-09-08 | DRG: 376 | Disposition: A | Discharge status: Home / Self Care | Payer: Medicare Other | Attending: Family Medicine | Admitting: Family Medicine

## 2019-09-06 ENCOUNTER — Other Ambulatory Visit: Payer: Self-pay

## 2019-09-06 ENCOUNTER — Emergency Department (HOSPITAL_BASED_OUTPATIENT_CLINIC_OR_DEPARTMENT_OTHER): Payer: Medicare Other

## 2019-09-06 DIAGNOSIS — Z20822 Contact with and (suspected) exposure to covid-19: Secondary | ICD-10-CM | POA: Diagnosis present

## 2019-09-06 DIAGNOSIS — Z03818 Encounter for observation for suspected exposure to other biological agents ruled out: Secondary | ICD-10-CM | POA: Diagnosis not present

## 2019-09-06 DIAGNOSIS — R58 Hemorrhage, not elsewhere classified: Secondary | ICD-10-CM | POA: Diagnosis not present

## 2019-09-06 DIAGNOSIS — K6389 Other specified diseases of intestine: Secondary | ICD-10-CM | POA: Diagnosis not present

## 2019-09-06 DIAGNOSIS — Z8249 Family history of ischemic heart disease and other diseases of the circulatory system: Secondary | ICD-10-CM

## 2019-09-06 DIAGNOSIS — Z823 Family history of stroke: Secondary | ICD-10-CM | POA: Diagnosis not present

## 2019-09-06 DIAGNOSIS — E876 Hypokalemia: Secondary | ICD-10-CM | POA: Diagnosis present

## 2019-09-06 DIAGNOSIS — K625 Hemorrhage of anus and rectum: Secondary | ICD-10-CM

## 2019-09-06 DIAGNOSIS — C218 Malignant neoplasm of overlapping sites of rectum, anus and anal canal: Secondary | ICD-10-CM | POA: Diagnosis present

## 2019-09-06 DIAGNOSIS — Z955 Presence of coronary angioplasty implant and graft: Secondary | ICD-10-CM | POA: Diagnosis not present

## 2019-09-06 DIAGNOSIS — E041 Nontoxic single thyroid nodule: Secondary | ICD-10-CM | POA: Diagnosis not present

## 2019-09-06 DIAGNOSIS — J449 Chronic obstructive pulmonary disease, unspecified: Secondary | ICD-10-CM | POA: Diagnosis present

## 2019-09-06 DIAGNOSIS — K5900 Constipation, unspecified: Secondary | ICD-10-CM | POA: Diagnosis not present

## 2019-09-06 DIAGNOSIS — F410 Panic disorder [episodic paroxysmal anxiety] without agoraphobia: Secondary | ICD-10-CM | POA: Diagnosis present

## 2019-09-06 DIAGNOSIS — Z87891 Personal history of nicotine dependence: Secondary | ICD-10-CM

## 2019-09-06 DIAGNOSIS — E7849 Other hyperlipidemia: Secondary | ICD-10-CM | POA: Diagnosis not present

## 2019-09-06 DIAGNOSIS — K6289 Other specified diseases of anus and rectum: Secondary | ICD-10-CM | POA: Diagnosis not present

## 2019-09-06 DIAGNOSIS — I2510 Atherosclerotic heart disease of native coronary artery without angina pectoris: Secondary | ICD-10-CM | POA: Diagnosis present

## 2019-09-06 DIAGNOSIS — K624 Stenosis of anus and rectum: Secondary | ICD-10-CM | POA: Diagnosis present

## 2019-09-06 DIAGNOSIS — F418 Other specified anxiety disorders: Secondary | ICD-10-CM | POA: Diagnosis not present

## 2019-09-06 DIAGNOSIS — Z951 Presence of aortocoronary bypass graft: Secondary | ICD-10-CM

## 2019-09-06 DIAGNOSIS — K629 Disease of anus and rectum, unspecified: Secondary | ICD-10-CM | POA: Diagnosis not present

## 2019-09-06 DIAGNOSIS — K5730 Diverticulosis of large intestine without perforation or abscess without bleeding: Secondary | ICD-10-CM | POA: Diagnosis not present

## 2019-09-06 DIAGNOSIS — I10 Essential (primary) hypertension: Secondary | ICD-10-CM | POA: Diagnosis not present

## 2019-09-06 LAB — BASIC METABOLIC PANEL
Anion gap: 7 (ref 5–15)
BUN: 12 mg/dL (ref 8–23)
CO2: 29 mmol/L (ref 22–32)
Calcium: 9.2 mg/dL (ref 8.9–10.3)
Chloride: 106 mmol/L (ref 98–111)
Creatinine, Ser: 0.55 mg/dL (ref 0.44–1.00)
GFR calc Af Amer: >60 mL/min (ref >60)
GFR calc non Af Amer: >60 mL/min (ref >60)
Glucose, Bld: 95 mg/dL (ref 70–99)
Potassium: 3.4 mmol/L — ABNORMAL LOW (ref 3.5–5.1)
Sodium: 142 mmol/L (ref 135–145)

## 2019-09-06 LAB — CBC WITH DIFFERENTIAL/PLATELET
Abs Immature Granulocytes: 0.02 10*3/uL (ref 0.00–0.07)
Basophils Absolute: 0 10*3/uL (ref 0.0–0.1)
Basophils Relative: 1 %
Eosinophils Absolute: 0.1 10*3/uL (ref 0.0–0.5)
Eosinophils Relative: 2 %
HCT: 42.2 % (ref 36.0–46.0)
Hemoglobin: 13.4 g/dL (ref 12.0–15.0)
Immature Granulocytes: 0 %
Lymphocytes Relative: 17 %
Lymphs Abs: 1.1 10*3/uL (ref 0.7–4.0)
MCH: 29.2 pg (ref 26.0–34.0)
MCHC: 31.8 g/dL (ref 30.0–36.0)
MCV: 91.9 fL (ref 80.0–100.0)
Monocytes Absolute: 0.5 10*3/uL (ref 0.1–1.0)
Monocytes Relative: 9 %
Neutro Abs: 4.4 10*3/uL (ref 1.7–7.7)
Neutrophils Relative %: 71 %
Platelets: 242 10*3/uL (ref 150–400)
RBC: 4.59 MIL/uL (ref 3.87–5.11)
RDW: 13.3 % (ref 11.5–15.5)
WBC: 6.2 10*3/uL (ref 4.0–10.5)
nRBC: 0 % (ref 0.0–0.2)

## 2019-09-06 LAB — HEMOGLOBIN: Hemoglobin: 12.8 g/dL (ref 12.0–15.0)

## 2019-09-06 LAB — SARS CORONAVIRUS 2 BY RT PCR (HOSPITAL ORDER, PERFORMED IN ~~LOC~~ HOSPITAL LAB): SARS Coronavirus 2: NEGATIVE

## 2019-09-06 MED ORDER — ACETAMINOPHEN 325 MG PO TABS: 650.0000 mg | ORAL_TABLET | Freq: Four times a day (QID) | ORAL | Status: DC | PRN

## 2019-09-06 MED ORDER — IOHEXOL 300 MG/ML  SOLN
100.0000 mL | Freq: Once | INTRAMUSCULAR | Status: AC | PRN
  Administered 2019-09-06: 100 mL via INTRAVENOUS

## 2019-09-06 MED ORDER — CLONAZEPAM 0.5 MG PO TABS: 0.2500 mg | ORAL_TABLET | Freq: Every evening | ORAL | Status: DC | PRN

## 2019-09-06 MED ORDER — ATORVASTATIN CALCIUM 40 MG PO TABS
40.0000 mg | ORAL_TABLET | Freq: Every day | ORAL | Status: DC
  Administered 2019-09-07 – 2019-09-08 (×2): 40 mg via ORAL
  Filled 2019-09-06 (×2): qty 1

## 2019-09-06 MED ORDER — FLUOXETINE HCL 20 MG PO CAPS
40.0000 mg | ORAL_CAPSULE | Freq: Every day | ORAL | Status: DC
  Administered 2019-09-07 – 2019-09-08 (×2): 40 mg via ORAL
  Filled 2019-09-06 (×2): qty 2

## 2019-09-06 MED ORDER — AMLODIPINE BESYLATE 10 MG PO TABS
10.0000 mg | ORAL_TABLET | Freq: Every day | ORAL | Status: DC
  Administered 2019-09-07 – 2019-09-08 (×2): 10 mg via ORAL
  Filled 2019-09-06 (×2): qty 1

## 2019-09-06 MED ORDER — LOSARTAN POTASSIUM 50 MG PO TABS
100.0000 mg | ORAL_TABLET | Freq: Every day | ORAL | Status: DC
  Administered 2019-09-07 – 2019-09-08 (×2): 100 mg via ORAL
  Filled 2019-09-06 (×2): qty 2

## 2019-09-06 MED ORDER — ONDANSETRON HCL 4 MG/2ML IJ SOLN: 4.0000 mg | Freq: Four times a day (QID) | INTRAMUSCULAR | Status: DC | PRN

## 2019-09-06 MED ORDER — SODIUM CHLORIDE 0.9 % IV SOLN: INTRAVENOUS | Status: DC

## 2019-09-06 MED ORDER — ONDANSETRON HCL 4 MG PO TABS: 4.0000 mg | ORAL_TABLET | Freq: Four times a day (QID) | ORAL | Status: DC | PRN

## 2019-09-06 MED ORDER — VITAMIN D 25 MCG (1000 UNIT) PO TABS
1000.0000 [IU] | ORAL_TABLET | Freq: Every day | ORAL | Status: DC
  Administered 2019-09-06 – 2019-09-08 (×3): 1000 [IU] via ORAL
  Filled 2019-09-06 (×3): qty 1

## 2019-09-06 MED ORDER — IPRATROPIUM-ALBUTEROL 0.5-2.5 (3) MG/3ML IN SOLN: 3.0000 mL | RESPIRATORY_TRACT | Status: DC | PRN

## 2019-09-06 MED ORDER — TRAMADOL HCL 50 MG PO TABS: 50.0000 mg | ORAL_TABLET | Freq: Four times a day (QID) | ORAL | Status: DC | PRN

## 2019-09-06 MED ORDER — ACETAMINOPHEN 650 MG RE SUPP: 650.0000 mg | Freq: Four times a day (QID) | RECTAL | Status: DC | PRN

## 2019-09-06 NOTE — ED Notes (Signed)
Report called to Veneta Penton, receiving nurse at [facility]

## 2019-09-06 NOTE — H&P (Signed)
History and Physical    Patricia [name] [id number] DOB: Sep 05, 1940 DOA: 09/06/2019  PCP: Colon Branch, MD  Patient coming from: Home I have personally reviewed patient's old records in [place] link  Chief Complaint: Rectal bleeding  HPI: Patricia [name] is a 79 y.o. female with medical history significant for hypertension, coronary artery disease status post CABG and hyperlipidemia.   She has been at her baseline until the past few weeks when she started experiencing intermittent bloody stools and rectal bleeding which she initially attributed to hemorrhoids.  However, in the past few days, patient has noticed increased rectal bleeding hence decided to be evaluated for that today.  She denies any rectal pain.  She also has constipation.  Patient denies easy fatigability, fever, cough, shortness of breath, frequency, dysuria, loss of consciousness or headache.  ED Course: At the ED, patient was hemodynamically stable and afebrile.  She had a normal hemoglobin of 13.4g/dl.  SARS-CoV-2 test was negative.  Her work-up with abdominal pelvic CT scan revealed a 5.1 x 3.2 x 4.1 perirectal lesion was noted.  Case was discussed with [facility] provider who recommended admission for possible sigmoidoscopy tomorrow.  Review of Systems: As per HPI otherwise 10 point review of systems negative.   Negative except as highlighted in HPI.   Past Medical History:  Diagnosis Date   Abnormal LFTs    Allergic rhinitis    Anxiety and depression    son commited suicide 2010   Arthritis    In her thumb   CAD (coronary artery disease)    s/p CABG   Carotid artery disease (HCC)    Moderate - f/u dopplers needed 11/2012   Central retinal vein occlusion of left eye 02/2015   Receiving injections in eye every 5 weeks by Dr. Zadie Rhine   COPD (chronic obstructive pulmonary disease) ([place])    HTN (hypertension)    intolerant to ACEi (cough)   Hyperlipidemia    Osteopenia    PAD (peripheral artery  disease) ([place])    Carotid dz as  below, and at [facility] 11/12:  critical stenosis noted just below the sheath site leading into a totally occluded SFA and a patent deep femoral artery   Panic attacks    Thyroid nodule     Past Surgical History:  Procedure Laterality Date   CORONARY ANGIOPLASTY WITH STENT PLACEMENT     "1; makes total of 3" (07/05/2012)   CORONARY ARTERY BYPASS GRAFT N/A 12/15/2013   Procedure: CORONARY ARTERY BYPASS GRAFTING (CABG);  Surgeon: Gaye Pollack, MD;  Location: [place];  Service: Open Heart Surgery;  Laterality: N/A;  Times 3 using left internal mammary artery and endoscopically harvested right saphenous vein    INTRAOPERATIVE TRANSESOPHAGEAL ECHOCARDIOGRAM N/A 12/15/2013   Procedure: INTRAOPERATIVE TRANSESOPHAGEAL ECHOCARDIOGRAM;  Surgeon: Gaye Pollack, MD;  Location: [place] OR;  Service: Open Heart Surgery;  Laterality: N/A;   KIDNEY SURGERY  1960's   "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; called it floating; had OR to put it where it belongs" (07/05/2012)   LEFT HEART CATHETERIZATION WITH CORONARY ANGIOGRAM N/A 07/02/2011   Procedure: LEFT HEART CATHETERIZATION WITH CORONARY ANGIOGRAM;  Surgeon: Sherren Mocha, MD;  Location[facility] C[facility] LAB;  Service: Cardiovascular;  Laterality: N/A;   LEFT HEART CATHETERIZATION WITH CORONARY ANGIOGRAM N/A 07/05/2012   Procedure: LEFT HEART CATHETERIZATION WITH CORONARY ANGIOGRAM;  Surgeon: Sherren Mocha, MD;  Location: [facility] CATH LAB;  Service: Cardiovascular;  Laterality: N/A;   LEFT HEART CATHETERIZATION WITH  CORONARY ANGIOGRAM N/A 11/28/2013   Procedure: LEFT HEART CATHETERIZATION WITH CORONARY ANGIOGRAM;  Surgeon: Blane Ohara, MD;  Location: [facility] CATH LAB;  Service: Cardiovascular;  Laterality: N/A;   PERCUTANEOUS CORONARY INTERVENTION-BALLOON ONLY  07/02/2011   Procedure: PERCUTANEOUS CORONARY INTERVENTION-BALLOON ONLY;  Surgeon: Sherren Mocha, MD;  Location: [facility] CATH LAB;  Service: Cardiovascular;;   PERCUTANEOUS CORONARY  INTERVENTION-BALLOON ONLY  07/05/2012   Procedure: PERCUTANEOUS CORONARY INTERVENTION-BALLOON ONLY;  Surgeon: Sherren Mocha, MD;  Location: [facility] CATH LAB;  Service: Cardiovascular;;   VAGINAL HYSTERECTOMY  ~ 1976   no oophorectomy     reports that she quit smoking about 5 years ago. Her smoking use included cigarettes. She has a 27.00 pack-year smoking history. She has never used smokeless tobacco. She reports current alcohol use. She reports that she does not use drugs.  Allergies  Allergen Reactions   Ace Inhibitors     cough    Family History  Problem Relation Age of Onset   Hypertension Mother    Stroke Mother    Coronary artery disease Father        CAD in female relative <50   Heart attack Father    Heart attack Brother 95   Heart Problems Sister 27       perminant pacemaker   Suicidality Son 44       2010   Healthy Grandchild    Colon cancer Neg Hx    Breast cancer Neg Hx    Stomach cancer Neg Hx    Rectal cancer Neg Hx    Esophageal cancer Neg Hx    Liver cancer Neg Hx    Positive for hypertension   Prior to Admission medications   Medication Sig Start Date End Date Taking? Authorizing Provider  amLODipine (NORVASC) 10 MG tablet Take 1 tablet (10 mg total) by mouth daily. 08/22/19   Colon Branch, MD  aspirin 81 MG EC tablet Take 1 tablet (81 mg total) by mouth daily. 10/22/15   Sherren Mocha, MD  atorvastatin (LIPITOR) 40 MG tablet Take 1 tablet (40 mg total) by mouth daily. 07/11/19   Colon Branch, MD  budesonide-formoterol [facility]) 160-4.5 MCG/ACT inhaler Inhale 2 puffs into the lungs 2 (two) times daily. Patient taking differently: Inhale 2 puffs into the lungs as directed.  06/04/17   Colon Branch, MD  Cholecalciferol (VITAMIN D3) 1000 UNITS CAPS Take 1,000 Units by mouth daily.    [provider]  clonazePAM (KLONOPIN) 0.5 MG tablet TAKE 1 TABLET BY MOUTH IN THE MORNING AS NEEDED FOR ANXIETY AND 1 & 1/2 TO 2 (ONE & ONE-HALF TO TWO) AT  BEDTIME AS NEEDED FOR SLEEP 08/01/19   Colon Branch, MD  FLUoxetine (PROZAC) 20 MG capsule Take 2 capsules (40 mg total) by mouth daily. 08/01/19   Colon Branch, MD  losartan (COZAAR) 100 MG tablet Take 1 tablet by mouth once daily 05/31/19   Sherren Mocha, MD  naproxen sodium (ANAPROX) 220 MG tablet Take 220 mg by mouth 2 (two) times daily with a meal.    [provider]  traMADol (ULTRAM) 50 MG tablet Take 50 mg by mouth every 6 (six) hours as needed. 04/06/19   [provider]    Physical Exam: Vitals:   09/06/19 1345 09/06/19 1615 09/06/19 1809 09/06/19 1937  BP:  (!) 161/64 (!) 146/80 (!) 172/61  Pulse:  77 90 83  Resp:  16 17 16   Temp:    98.4 F (36.9 C)  TempSrc:    Oral  SpO2:  94% 91% 95%  Weight: 58 kg     Height: 5\' 2"  (1.575 m)       Constitutional: NAD, calm, comfortable Vitals:   09/06/19 1345 09/06/19 1615 09/06/19 1809 09/06/19 1937  BP:  (!) 161/64 (!) 146/80 (!) 172/61  Pulse:  77 90 83  Resp:  16 17 16   Temp:    98.4 F (36.9 C)  TempSrc:    Oral  SpO2:  94% 91% 95%  Weight: 58 kg     Height: 5\' 2"  (1.575 m)      Eyes: PERRL, lids and conjunctivae normal ENMT: Mucous membranes are moist. Posterior pharynx clear of any exudate or lesions.Normal dentition.  Neck: normal, supple, no masses, no thyromegaly Respiratory: clear to auscultation bilaterally, no wheezing, no crackles. Normal respiratory effort. No accessory muscle use.  Cardiovascular: Regular rate and rhythm, no murmurs / rubs / gallops. No extremity edema. 2+ pedal pulses. No carotid bruits.  Abdomen: no tenderness, no masses palpated. No hepatosplenomegaly. Bowel sounds positive.  There was perianal lesion noted Musculoskeletal: no clubbing / cyanosis. No joint deformity upper and lower extremities. Good ROM, no contractures. Normal muscle tone.  Skin: no rashes, lesions, ulcers. No induration Neurologic: CN 2-12 grossly intact. Sensation intact, DTR normal. Strength 5/5 in all  4.  Psychiatric: Normal judgment and insight. Alert and oriented x 3. Normal mood.   Labs on Admission: I have personally reviewed following labs and imaging studies  CBC: Recent Labs  Lab 09/06/19 1439  WBC 6.2  NEUTROABS 4.4  HGB 13.4  HCT 42.2  MCV 91.9  PLT [id number]   Basic Metabolic Panel: Recent Labs  Lab 09/06/19 1439  NA 142  K 3.4*  CL 106  CO2 29  GLUCOSE 95  BUN 12  CREATININE 0.55  CALCIUM 9.2   GFR: Estimated Creatinine Clearance: 45.1 mL/min (by C-G formula based on SCr of 0.55 mg/dL). Liver Function Tests: No results for input(s): AST, ALT, ALKPHOS, BILITOT, PROT, ALBUMIN in the last 168 hours. No results for input(s): LIPASE, AMYLASE in the last 168 hours. No results for input(s): AMMONIA in the last 168 hours. Coagulation Profile: No results for input(s): INR, PROTIME in the last 168 hours. Cardiac Enzymes: No results for input(s): CKTOTAL, CKMB, CKMBINDEX, TROPONINI in the last 168 hours. BNP (last 3 results) No results for input(s): PROBNP in the last 8760 hours. HbA1C: No results for input(s): HGBA1C in the last 72 hours. CBG: No results for input(s): GLUCAP in the last 168 hours. Lipid Profile: No results for input(s): CHOL, HDL, LDLCALC, TRIG, CHOLHDL, LDLDIRECT in the last 72 hours. Thyroid Function Tests: No results for input(s): TSH, T4TOTAL, FREET4, T3FREE, THYROIDAB in the last 72 hours. Anemia Panel: No results for input(s): VITAMINB12, FOLATE, FERRITIN, TIBC, IRON, RETICCTPCT in the last 72 hours. Urine analysis:    Component Value Date/Time   COLORURINE YELLOW 12/04/2014 1346   APPEARANCEUR CLEAR 12/04/2014 1346   LABSPEC >=1.030 (A) 12/04/2014 1346   PHURINE 5.5 12/04/2014 1346   GLUCOSEU NEGATIVE 12/04/2014 1346   HGBUR NEGATIVE 12/04/2014 1346   BILIRUBINUR NEGATIVE 12/04/2014 1346   BILIRUBINUR neg 09/16/2014 1503   KETONESUR NEGATIVE 12/04/2014 1346   PROTEINUR neg 09/16/2014 1503   PROTEINUR NEGATIVE 12/13/2013 1605    UROBILINOGEN 0.2 12/04/2014 1346   NITRITE POSITIVE (A) 12/04/2014 1346   LEUKOCYTESUR TRACE (A) 12/04/2014 1346    Radiological Exams on Admission: CT ABDOMEN PELVIS W CONTRAST  Result Date:  09/06/2019 CLINICAL DATA:  Rectal mass on physical exam. EXAM: CT ABDOMEN AND PELVIS WITH CONTRAST TECHNIQUE: Multidetector CT imaging of the abdomen and pelvis was performed using the standard protocol following bolus administration of intravenous contrast. CONTRAST:  157mL OMNIPAQUE IOHEXOL 300 MG/ML  SOLN COMPARISON:  None. FINDINGS: Lower chest: No acute abnormality. Hepatobiliary: No focal liver abnormality is seen. Mild diffuse fatty infiltration of the liver parenchyma is noted. No gallstones, gallbladder wall thickening, or biliary dilatation. Pancreas: Unremarkable. No pancreatic ductal dilatation or surrounding inflammatory changes. Spleen: Normal in size without focal abnormality. Adrenals/Urinary Tract: The right adrenal gland is normal in appearance. A 2.8 cm x 2.1 cm heterogeneous low-attenuation left adrenal mass is noted. The right kidney is normal in size and appearance, while the left kidney is mildly decreased in size and slightly malpositioned within the posteromedial aspect of the mid left abdomen. Mild focal scarring is seen involving the upper pole of the left kidney. Subcentimeter cysts are seen within the right kidney. A mild amount of calcification is seen along the posterolateral aspect of the left kidney, without evidence of renal calculi or hydronephrosis. Bladder is unremarkable. Stomach/Bowel: Stomach is within normal limits. The appendix is not clearly identified. No evidence of bowel dilatation. Noninflamed diverticula are seen throughout the sigmoid colon. A 5.1 cm x 3.2 cm x 4.1 cm complex area of fluid attenuation (approximately 44.34 Hounsfield units) is seen within the posterior perirectal region on the left (axial CT images 79 through 89, CT series number 2). A very thin  surrounding hyperdense rim is noted. A very mild amount of associated inflammatory fat stranding is present. Vascular/Lymphatic: Marked severity aortic atherosclerosis with marked severity calcification of the bilateral common iliac arteries. No enlarged abdominal or pelvic lymph nodes. Reproductive: Status post hysterectomy. No adnexal masses. Other: No abdominal wall hernia or abnormality. No abdominopelvic ascites. Musculoskeletal: Multilevel degenerative changes seen throughout the lumbar spine IMPRESSION: 1. Complex 5.1 cm x 3.2 cm x 4.1 cm posterior left perirectal fluid collection/abscess. Follow-up to resolution is recommended, as an associated soft tissue component and underlying neoplastic process cannot be excluded. 2. Sigmoid diverticulosis. 3. Low-attenuation left adrenal mass which may represent an adrenal adenoma. Electronically Signed   By: Virgina Norfolk M.D.   On: 09/06/2019 15:42    EKG: Independently reviewed.  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  Anorectal lesion: This is very concerning for possible malignancy.  Patient is scheduled to have sigmoidoscopy tomorrow.  Further recommendations as per GI team. We will consider general surgery consult where indicated.  2.  Lower GI bleed: This is most likely secondary to anorectal mass.  Hemoglobin is stable.  Serial hemoglobin monitoring will be done.  Further recommendations will be dependent on findings of sigmoidoscopy.  3.  Hypertension: Monitor blood pressure profile closely.  Patient will be continued on amlodipine and losartan.  4.  Hyperlipidemia: Continue statin  5.  Coronary artery disease status post CABG: No cardiopulmonary symptoms presently.  Hold aspirin due to ongoing GI bleeding and procedure tomorrow.  Continue statins and losartan.  6.  COPD: No evidence of exacerbation.  Continue as needed bronchodilators.  7.  Anxiety/depression: Continue prozac and  klonopin  8.  Left adrenal mass 2.8 x 2.1 cm: Patient will benefit from endocrinology follow-up with you can be pursued as an outpatient.  DVT prophylaxis: SCDs  Code Status: DNR discussed with patient  Family Communication: Unable to discuss with family at the  time of documentation  Disposition Plan: Patient is anticipated to be discharged home in 2 to 3 days  Consults called: ED already consulted GI. I also called covering [facility] attending.  Admission status: Inpatient to medical floor  Phineas Semen MD [facility] Pager [phone]  If 7PM-7AM, please contact night-coverage [email or web address] Password [email or web address]  09/06/2019, 8:24 PM

## 2019-09-06 NOTE — ED Notes (Signed)
Patient transported to CT 

## 2019-09-06 NOTE — ED Triage Notes (Addendum)
Constipation for a week.  External Hemorrhoid has been bleeding more for a week. Has had it for years.OTC meds not helping either condition. Pt sts she called EMS because she thought she could get seen quicker than coming herself. Family is at home.  They will come for her.

## 2019-09-06 NOTE — ED Provider Notes (Signed)
[facility] EMERGENCY DEPARTMENT Provider Note   CSN: [id number] Arrival date & time: 09/06/19  1338     History Chief Complaint  Patient presents with   Hemorroid and constipation    Patricia [name] is a 79 y.o. female with history of CAD, COPD, hypertension, hyperlipidemia, peripheral arterial disease, osteopenia presents for evaluation of acute onset, persistent constipation and progressively worsening rectal bleeding.  Reports that she began to feel constipated around 3 weeks ago, states she typically has 3 bowel movements a day but has been able to pass only small amounts of stool over the last 3 weeks, worsening more recently.   She does note that she has been straining to have a bowel movement.  She states that the blood was initially intermittent, noted only with bowel movements and is now constant and she does notice it on pantiliners.  She was on baby aspirin for "heart health" but she discontinued this when her symptoms began.  She denies abdominal pain, nausea, vomiting, fevers, urinary symptoms, shortness of breath, chest pain, or cough.  She has tried over-the-counter creams such as Preparation H without relief of symptoms.  She thinks that she has a hemorrhoid but she is not sure, though attempted to examine the anus at home.   The history is provided by the patient.       Past Medical History:  Diagnosis Date   Abnormal LFTs    Allergic rhinitis    Anxiety and depression    son commited suicide 2010   Arthritis    In her thumb   CAD (coronary artery disease)    s/p CABG   Carotid artery disease (HCC)    Moderate - f/u dopplers needed 11/2012   Central retinal vein occlusion of left eye 02/2015   Receiving injections in eye every 5 weeks by Dr. Zadie Rhine   COPD (chronic obstructive pulmonary disease) ([place])    HTN (hypertension)    intolerant to ACEi (cough)   Hyperlipidemia    Osteopenia    PAD (peripheral artery disease) (HCC)    Carotid dz  as  below, and at [facility] 11/12:  critical stenosis noted just below the sheath site leading into a totally occluded SFA and a patent deep femoral artery   Panic attacks    Thyroid nodule     Patient Active Problem List   Diagnosis Date Noted   Anorectal mass, probable anal cancer 09/06/2019   CAP (community acquired pneumonia) 05/15/2016   Right shoulder pain 12/26/2015   PCP NOTES >>>>>>>>>>>>>>> 12/13/2015   Retinal vein occlusion--Left dx 02-2015 03/20/2015   Recurrent UTI 06/27/2014   Hyperglycemia 01/25/2014   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   Annual physical exam 08/08/2011   PAD (peripheral artery disease) ([place]) 07/21/2011   OTHER DYSPNEA AND RESPIRATORY ABNORMALITIES 07/02/2010   Coronary atherosclerosis of native coronary artery 07/02/2010   Allergic rhinitis 10/16/2009   Essential hypertension 07/20/2009   Anxiety,depression, insomnia 05/18/2009   SKIN LESION 01/12/2008   THYROID [place] [facility] 2012, stable since 2009 04/26/2007   H/O tobacco use, presenting hazards to health 04/26/2007   Hyperlipidemia 01/25/2007   Carotid artery disease ([place]) 01/25/2007   COPD (chronic obstructive pulmonary disease) ([place]) 01/25/2007   OSTEOPENIA 01/25/2007    Past Surgical History:  Procedure Laterality Date   CORONARY ANGIOPLASTY WITH STENT PLACEMENT     "1; makes total of 3" (07/05/2012)   CORONARY ARTERY BYPASS GRAFT N/A 12/15/2013   Procedure: CORONARY ARTERY BYPASS GRAFTING (CABG);  Surgeon:  Gaye Pollack, MD;  Location: [facility];  Service: Open Heart Surgery;  Laterality: N/A;  Times 3 using left internal mammary artery and endoscopically harvested right saphenous vein    INTRAOPERATIVE TRANSESOPHAGEAL ECHOCARDIOGRAM N/A 12/15/2013   Procedure: INTRAOPERATIVE TRANSESOPHAGEAL ECHOCARDIOGRAM;  Surgeon: Gaye Pollack, MD;  Location: [facility] OR;  Service: Open Heart Surgery;  Laterality: N/A;   KIDNEY SURGERY  1960's   "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;  called it floating; had OR to put it where it belongs" (07/05/2012)   LEFT HEART CATHETERIZATION WITH CORONARY ANGIOGRAM N/A 07/02/2011   Procedure: LEFT HEART CATHETERIZATION WITH CORONARY ANGIOGRAM;  Surgeon: Sherren Mocha, MD;  Location: [facility] CATH LAB;  Service: Cardiovascular;  Laterality: N/A;   LEFT HEART CATHETERIZATION WITH CORONARY ANGIOGRAM N/A 07/05/2012   Procedure: LEFT HEART CATHETERIZATION WITH CORONARY ANGIOGRAM;  Surgeon: Sherren Mocha, MD;  Location: [facility] CATH LAB;  Service: Cardiovascular;  Laterality: N/A;   LEFT HEART CATHETERIZATION WITH CORONARY ANGIOGRAM N/A 11/28/2013   Procedure: LEFT HEART CATHETERIZATION WITH CORONARY ANGIOGRAM;  Surgeon: Blane Ohara, MD;  Location: [facility] CATH LAB;  Service: Cardiovascular;  Laterality: N/A;   PERCUTANEOUS CORONARY INTERVENTION-BALLOON ONLY  07/02/2011   Procedure: PERCUTANEOUS CORONARY INTERVENTION-BALLOON ONLY;  Surgeon: Sherren Mocha, MD;  Location: [facility] CATH LAB;  Service: Cardiovascular;;   PERCUTANEOUS CORONARY INTERVENTION-BALLOON ONLY  07/05/2012   Procedure: PERCUTANEOUS CORONARY INTERVENTION-BALLOON ONLY;  Surgeon: Sherren Mocha, MD;  Location: [facility] CATH LAB;  Service: Cardiovascular;;   VAGINAL HYSTERECTOMY  ~ 1976   no oophorectomy     OB History   No obstetric history on file.     Family History  Problem Relation Age of Onset   Hypertension Mother    Stroke Mother    Coronary artery disease Father        CAD in female relative <50   Heart attack Father    Heart attack Brother 38   Heart Problems Sister 22       perminant pacemaker   Suicidality Son 44       2010   Healthy Grandchild    Colon cancer Neg Hx    Breast cancer Neg Hx    Stomach cancer Neg Hx    Rectal cancer Neg Hx    Esophageal cancer Neg Hx    Liver cancer Neg Hx     Social History   Tobacco Use   Smoking status: Former Smoker    Packs/day: 0.50    Years: 54.00    Pack years: 27.00    Types: Cigarettes    Quit  date: 12/15/2013    Years since quitting: 5.7   Smokeless tobacco: Never Used   Tobacco comment: quit tobacco 12-2013 after CABG  Substance Use Topics   Alcohol use: Yes   Drug use: No    Home Medications Prior to Admission medications   Medication Sig Start Date End Date Taking? Authorizing Provider  amLODipine (NORVASC) 10 MG tablet Take 1 tablet (10 mg total) by mouth daily. 08/22/19  Yes Paz, Alda Berthold, MD  atorvastatin (LIPITOR) 40 MG tablet Take 1 tablet (40 mg total) by mouth daily. 07/11/19  Yes Paz, Alda Berthold, MD  budesonide-formoterol [facility]) 160-4.5 MCG/ACT inhaler Inhale 2 puffs into the lungs 2 (two) times daily. Patient taking differently: Inhale 2 puffs into the lungs daily as needed (sob and wheezing).  06/04/17  Yes Paz, Alda Berthold, MD  Cholecalciferol (VITAMIN D3) 1000 UNITS CAPS Take 1,000 Units by mouth daily.   Yes [provider]  clonazePAM (KLONOPIN) 0.5 MG tablet TAKE 1 TABLET BY MOUTH IN THE MORNING AS NEEDED FOR ANXIETY AND 1 & 1/2 TO 2 (ONE & ONE-HALF TO TWO) AT BEDTIME AS NEEDED FOR SLEEP Patient taking differently: Take 0.25 mg by mouth at bedtime as needed for anxiety (sleep).  08/01/19  Yes Paz, Alda Berthold, MD  FLUoxetine (PROZAC) 20 MG capsule Take 2 capsules (40 mg total) by mouth daily. 08/01/19  Yes Colon Branch, MD  losartan (COZAAR) 100 MG tablet Take 1 tablet by mouth once daily 05/31/19  Yes Sherren Mocha, MD  naproxen sodium (ANAPROX) 220 MG tablet Take 220 mg by mouth daily as needed (pain).    Yes [provider]    Allergies    Ace inhibitors  Review of Systems   Review of Systems  Constitutional: Negative for chills and fever.  Respiratory: Negative for cough and shortness of breath.   Cardiovascular: Negative for chest pain.  Gastrointestinal: Positive for blood in stool and constipation. Negative for abdominal pain, nausea and vomiting.  Genitourinary: Negative for dysuria, frequency, hematuria and urgency.  All other systems  reviewed and are negative.   Physical Exam Updated Vital Signs BP (!) 172/61 (BP Location: Right Arm)    Pulse 83    Temp 98.4 F (36.9 C) (Oral)    Resp 16    Ht 5\' 2"  (1.575 m)    Wt 58 kg    SpO2 95%    BMI 23.37 kg/m   Physical Exam Vitals and nursing note reviewed.  Constitutional:      General: She is not in acute distress.    Appearance: She is well-developed.  HENT:     Head: Normocephalic and atraumatic.  Eyes:     General:        Right eye: No discharge.        Left eye: No discharge.     Conjunctiva/sclera: Conjunctivae normal.  Neck:     Vascular: No JVD.     Trachea: No tracheal deviation.  Cardiovascular:     Rate and Rhythm: Normal rate and regular rhythm.  Pulmonary:     Effort: Pulmonary effort is normal.  Abdominal:     General: Abdomen is flat. Bowel sounds are normal. There is no distension.     Palpations: Abdomen is soft.     Tenderness: There is no abdominal tenderness. There is no right CVA tenderness, left CVA tenderness, guarding or rebound.  Genitourinary:    Comments: Examination performed in the presence of a chaperone. See below image. Patient with large firm almost verrucous mass along the anus in the 7 o clock to 12 o clock positions. She also has a few small external hemorrhoids which do not appear to be thrombosed or bleeding. nontender to light palpation, however upon attempt to perform DRE, she has significant pain, and the tissue begins to bleed. Appears friable. Unable to perform DRE due to small caliber of anus likely secondary to mass.  Musculoskeletal:     Cervical back: Normal range of motion and neck supple.  Skin:    General: Skin is warm and dry.     Findings: No erythema.  Neurological:     Mental Status: She is alert.  Psychiatric:        Behavior: Behavior normal.       ED Results / Procedures / Treatments   Labs (all labs ordered are listed, but only abnormal results are displayed) Labs Reviewed  BASIC METABOLIC  PANEL - Abnormal; Notable for the following components:      Result Value   Potassium 3.4 (*)    All other components within normal limits  SARS CORONAVIRUS 2 BY RT PCR (HOSPITAL ORDER, [place] [facility])  CBC WITH DIFFERENTIAL/PLATELET  HEMOGLOBIN  COMPREHENSIVE METABOLIC PANEL  CBC  PROTIME-INR  HEMOGLOBIN  HEMOGLOBIN    EKG None  Radiology CT ABDOMEN PELVIS W CONTRAST  Result Date: 09/06/2019 CLINICAL DATA:  Rectal mass on physical exam. EXAM: CT ABDOMEN AND PELVIS WITH CONTRAST TECHNIQUE: Multidetector CT imaging of the abdomen and pelvis was performed using the standard protocol following bolus administration of intravenous contrast. CONTRAST:  126mL OMNIPAQUE IOHEXOL 300 MG/ML  SOLN COMPARISON:  None. FINDINGS: Lower chest: No acute abnormality. Hepatobiliary: No focal liver abnormality is seen. Mild diffuse fatty infiltration of the liver parenchyma is noted. No gallstones, gallbladder wall thickening, or biliary dilatation. Pancreas: Unremarkable. No pancreatic ductal dilatation or surrounding inflammatory changes. Spleen: Normal in size without focal abnormality. Adrenals/Urinary Tract: The right adrenal gland is normal in appearance. A 2.8 cm x 2.1 cm heterogeneous low-attenuation left adrenal mass is noted. The right kidney is normal in size and appearance, while the left kidney is mildly decreased in size and slightly malpositioned within the posteromedial aspect of the mid left abdomen. Mild focal scarring is seen involving the upper pole of the left kidney. Subcentimeter cysts are seen within the right kidney. A mild amount of calcification is seen along the posterolateral aspect of the left kidney, without evidence of renal calculi or hydronephrosis. Bladder is unremarkable. Stomach/Bowel: Stomach is within normal limits. The appendix is not clearly identified. No evidence of bowel dilatation. Noninflamed diverticula are seen throughout the sigmoid colon. A 5.1  cm x 3.2 cm x 4.1 cm complex area of fluid attenuation (approximately 44.34 Hounsfield units) is seen within the posterior perirectal region on the left (axial CT images 79 through 89, CT series number 2). A very thin surrounding hyperdense rim is noted. A very mild amount of associated inflammatory fat stranding is present. Vascular/Lymphatic: Marked severity aortic atherosclerosis with marked severity calcification of the bilateral common iliac arteries. No enlarged abdominal or pelvic lymph nodes. Reproductive: Status post hysterectomy. No adnexal masses. Other: No abdominal wall hernia or abnormality. No abdominopelvic ascites. Musculoskeletal: Multilevel degenerative changes seen throughout the lumbar spine IMPRESSION: 1. Complex 5.1 cm x 3.2 cm x 4.1 cm posterior left perirectal fluid collection/abscess. Follow-up to resolution is recommended, as an associated soft tissue component and underlying neoplastic process cannot be excluded. 2. Sigmoid diverticulosis. 3. Low-attenuation left adrenal mass which may represent an adrenal adenoma. Electronically Signed   By: Virgina Norfolk M.D.   On: 09/06/2019 15:42    Procedures Procedures (including critical care time)  Medications Ordered in ED Medications  traMADol (ULTRAM) tablet 50 mg (has no administration in time range)  amLODipine (NORVASC) tablet 10 mg (has no administration in time range)  atorvastatin (LIPITOR) tablet 40 mg (has no administration in time range)  losartan (COZAAR) tablet 100 mg (has no administration in time range)  cholecalciferol (VITAMIN D3) tablet 1,000 Units (1,000 Units Oral Given 09/06/19 2135)  0.9 %  sodium chloride infusion ( Intravenous New Bag/Given 09/06/19 2142)  acetaminophen (TYLENOL) tablet 650 mg (has no administration in time range)    Or  acetaminophen (TYLENOL) suppository 650 mg (has no administration in time range)  ondansetron (ZOFRAN) tablet 4 mg (has no administration in time range)    Or  ondansetron (ZOFRAN) injection 4 mg (has no administration in time range)  ipratropium-albuterol (DUONEB) 0.5-2.5 (3) MG/3ML nebulizer solution 3 mL (has no administration in time range)  clonazePAM (KLONOPIN) tablet 0.25 mg (has no administration in time range)  FLUoxetine (PROZAC) capsule 40 mg (has no administration in time range)  iohexol (OMNIPAQUE) 300 MG/ML solution 100 mL (100 mLs Intravenous Contrast Given 09/06/19 1514)    ED Course  I have reviewed the triage vital signs and the nursing notes.  Pertinent labs & imaging results that were available during my care of the patient were reviewed by me and considered in my medical decision making (see chart for details).   MDM Rules/Calculators/A&P                      Patient presenting for evaluation of progressively worsening constipation, rectal bleeding, pain with attempts to have bowel movements.  She has only been able to pass very small amounts of stool over the last 3 weeks.  In the ED she is afebrile, vital signs are stable.  She is nontoxic in appearance.  She has no tenderness to palpation of the abdomen.  On chaperoned examination of the rectum, she has an abnormal lesion to the anus concerning for anal cancer.  The tissue appears friable, begins to bleed on my examination even with just light touch.  Very difficult to obtain digital rectal exam due to narrowing of the anus secondary to mass and she does have some discomfort here.  She has a couple small external hemorrhoids that do not appear to be thrombosed.  No signs of cellulitis or Fournier's gangrene.  Will obtain lab work and imaging.  Lab work reviewed by me shows no leukocytosis, no anemia, no metabolic derangements, no renal insufficiency.  CT scan shows a complex 5.1 cm x 3.2 cm x 4.1 cm posterior left perirectal fluid collection/abscess corresponding to the abnormal lesion noted externally.  Concerning for underlying neoplastic process.  No acute surgical abdominal  pathology is noted.  4:29 PM CONSULT: Spoke with Patricia [name] with general surgery who reviewed patient's images and attached image of the patient's anal lesion.  He feels that the fluid collection is less likely secondary to abscess, more likely edema in the setting of what is likely anal cancer.  He recommends consulting GI for further recommendations and starting on MiraLAX, pured diet with plan for eventual biopsy and colonoscopy.  No emergent surgical needs at this time. I  5:00PM CONSULT: Spoke with Patricia Freed, PA with GI.  Due to the rapidly progressing nature of the patient's symptoms and inability to pass stool she recommends admission to the hospital for further evaluation and management.  She recommends initiation of MiraLAX, clear liquid diet with plan for GI evaluation in the morning plan for eventual enema and flexible sigmoidoscopy.    Spoke with Patricia [name] with [facility] hospitalist service who agrees to assume care of patient and bring her into the hospital for further evaluation and management.  Plan for transfer to [facility] and will obtain rapid Covid test.  Patient seen and evaluated Patricia [name] who agrees with assessment and plan at this time.   Final Clinical Impression(s) / ED Diagnoses Final diagnoses:  Rectal lesion  Constipation, unspecified constipation type    Rx / DC Orders ED Discharge Orders    None       Debroah Baller 09/06/19 2348    Blanchie Dessert, MD 09/10/19 [phone]

## 2019-09-06 NOTE — ED Notes (Signed)
Report given to Laban Emperor RN with [facility]

## 2019-09-07 ENCOUNTER — Inpatient Hospital Stay (HOSPITAL_COMMUNITY): Payer: Medicare Other | Admitting: Certified Registered Nurse Anesthetist

## 2019-09-07 ENCOUNTER — Encounter (HOSPITAL_COMMUNITY)
Admission: EM | Disposition: A | Discharge status: Home / Self Care | Payer: Self-pay | Source: Home / Self Care | Attending: Family Medicine

## 2019-09-07 DIAGNOSIS — K5900 Constipation, unspecified: Secondary | ICD-10-CM

## 2019-09-07 DIAGNOSIS — K6289 Other specified diseases of anus and rectum: Secondary | ICD-10-CM

## 2019-09-07 DIAGNOSIS — K625 Hemorrhage of anus and rectum: Secondary | ICD-10-CM

## 2019-09-07 HISTORY — PX: FLEXIBLE SIGMOIDOSCOPY: SHX5431

## 2019-09-07 HISTORY — PX: BIOPSY: SHX5522

## 2019-09-07 LAB — PROTIME-INR
INR: 1.1 (ref 0.8–1.2)
Prothrombin Time: 14.5 seconds (ref 11.4–15.2)

## 2019-09-07 LAB — COMPREHENSIVE METABOLIC PANEL
ALT: 23 U/L (ref 0–44)
AST: 26 U/L (ref 15–41)
Albumin: 3.3 g/dL — ABNORMAL LOW (ref 3.5–5.0)
Alkaline Phosphatase: 120 U/L (ref 38–126)
Anion gap: 8 (ref 5–15)
BUN: 9 mg/dL (ref 8–23)
CO2: 29 mmol/L (ref 22–32)
Calcium: 8.7 mg/dL — ABNORMAL LOW (ref 8.9–10.3)
Chloride: 106 mmol/L (ref 98–111)
Creatinine, Ser: 0.54 mg/dL (ref 0.44–1.00)
GFR calc Af Amer: >60 mL/min (ref >60)
GFR calc non Af Amer: >60 mL/min (ref >60)
Glucose, Bld: 87 mg/dL (ref 70–99)
Potassium: 3 mmol/L — ABNORMAL LOW (ref 3.5–5.1)
Sodium: 143 mmol/L (ref 135–145)
Total Bilirubin: 0.6 mg/dL (ref 0.3–1.2)
Total Protein: 6 g/dL — ABNORMAL LOW (ref 6.5–8.1)

## 2019-09-07 LAB — CBC
HCT: 40.1 % (ref 36.0–46.0)
Hemoglobin: 12.6 g/dL (ref 12.0–15.0)
MCH: 29 pg (ref 26.0–34.0)
MCHC: 31.4 g/dL (ref 30.0–36.0)
MCV: 92.4 fL (ref 80.0–100.0)
Platelets: 215 10*3/uL (ref 150–400)
RBC: 4.34 MIL/uL (ref 3.87–5.11)
RDW: 13.3 % (ref 11.5–15.5)
WBC: 5.8 10*3/uL (ref 4.0–10.5)
nRBC: 0 % (ref 0.0–0.2)

## 2019-09-07 LAB — HEMOGLOBIN: Hemoglobin: 14.2 g/dL (ref 12.0–15.0)

## 2019-09-07 SURGERY — SIGMOIDOSCOPY, FLEXIBLE
Anesthesia: Monitor Anesthesia Care

## 2019-09-07 MED ORDER — PROPOFOL 10 MG/ML IV BOLUS
INTRAVENOUS | Status: DC | PRN
  Administered 2019-09-07 (×2): 20 mg via INTRAVENOUS
  Administered 2019-09-07: 30 mg via INTRAVENOUS

## 2019-09-07 MED ORDER — KCL IN DEXTROSE-NACL 20-5-0.9 MEQ/L-%-% IV SOLN
INTRAVENOUS | Status: DC
  Filled 2019-09-07 (×2): qty 1000

## 2019-09-07 MED ORDER — POTASSIUM CHLORIDE 2 MEQ/ML IV SOLN: INTRAVENOUS | Status: DC

## 2019-09-07 MED ORDER — LACTATED RINGERS IV SOLN
INTRAVENOUS | Status: AC | PRN
  Administered 2019-09-07: 1000 mL via INTRAVENOUS

## 2019-09-07 MED ORDER — PROPOFOL 1000 MG/100ML IV EMUL
INTRAVENOUS | Status: AC
  Filled 2019-09-07: qty 100

## 2019-09-07 MED ORDER — PROPOFOL 500 MG/50ML IV EMUL
INTRAVENOUS | Status: DC | PRN
  Administered 2019-09-07: 125 ug/kg/min via INTRAVENOUS

## 2019-09-07 NOTE — H&P (View-Only) (Signed)
[facility] Consult Note   History Patricia [name] MRN # [id number]  Date of Admission: 09/06/2019 Date of Consultation: 09/07/2019 Referring physician: Dr. Phineas Semen, MD Primary Care Provider: Colon Branch, MD Primary Gastroenterologist: Dr. Scarlette Shorts   Reason for Consultation/Chief Complaint: Anal pain and bleeding ,anal mass  Subjective  HPI:  This is a very pleasant 79 year old woman who came to [place] earlier today with escalating anal pain bleeding and constipation that been going on for a few weeks.  She thought this was hemorrhoids, then got concerned something more may be going on due to ongoing symptoms.  She has not had a bowel movement in a few days, and even then it was only small volume.  She has pain in the anal rectal area at rest, sitting, and says it is agonizing to try to pass a bowel movement. On exam admit center [place] earlier today was found to have a fungating anal mass, CT scan suggesting extension into the proximal rectum and perirectal space worrisome for malignancy.  She was transferred to the [facility] long ED and admitted to the medical service.  She denies abdominal pain, says her appetite is dropped off recently, no nausea or vomiting.  Last colonoscopy with Dr. Henrene Pastor December 2017 with 2 small adenomatous polyps. ROS:  Constitutional: May be some recent weight loss, she is uncertain   All other systems are negative except as noted above in the HPI  Past Medical History Past Medical History:  Diagnosis Date  . Abnormal LFTs   . Allergic rhinitis   . Anxiety and depression    son commited suicide 2010  . 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 CAD (coronary artery disease)    s/p CABG  . Carotid artery disease ([place])    Moderate - f/u dopplers needed 11/2012  . 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 Zadie Rhine  . COPD (chronic obstructive pulmonary disease) ([place])    . HTN (hypertension)    intolerant to ACEi (cough)  . Hyperlipidemia   . Osteopenia   . PAD (peripheral artery disease) ([place])    Carotid dz as  below, and at [facility] 11/12:  critical stenosis noted just below the sheath site leading into a totally occluded SFA and a patent deep femoral artery  . Panic attacks   . Thyroid nodule     Past Surgical History Past Surgical History:  Procedure Laterality Date  . CORONARY ANGIOPLASTY WITH STENT PLACEMENT     "1; makes total of 3" (07/05/2012)  . CORONARY ARTERY BYPASS GRAFT N/A 12/15/2013   Procedure: CORONARY ARTERY BYPASS GRAFTING (CABG);  Surgeon: Gaye Pollack, MD;  Location: [place];  Service: Open Heart Surgery;  Laterality: N/A;  Times 3 using left internal mammary artery and endoscopically harvested right saphenous vein   . INTRAOPERATIVE TRANSESOPHAGEAL ECHOCARDIOGRAM N/A 12/15/2013   Procedure: INTRAOPERATIVE TRANSESOPHAGEAL ECHOCARDIOGRAM;  Surgeon: Gaye Pollack, MD;  Location: [facility] OR;  Service: Open Heart Surgery;  Laterality: N/A;  . 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 LEFT HEART CATHETERIZATION WITH CORONARY ANGIOGRAM N/A 07/02/2011   Procedure: LEFT HEART CATHETERIZATION WITH CORONARY ANGIOGRAM;  Surgeon: Sherren Mocha, MD;  Location: [facility] CATH LAB;  Service: Cardiovascular;  Laterality: N/A;  . LEFT HEART CATHETERIZATION WITH CORONARY ANGIOGRAM  N/A 07/05/2012   Procedure: LEFT HEART CATHETERIZATION WITH CORONARY ANGIOGRAM;  Surgeon: Sherren Mocha, MD;  Location: [facility] CATH LAB;  Service: Cardiovascular;  Laterality: N/A;  . LEFT HEART CATHETERIZATION WITH CORONARY ANGIOGRAM N/A 11/28/2013   Procedure: LEFT HEART CATHETERIZATION WITH CORONARY ANGIOGRAM;  Surgeon: Blane Ohara, MD;  Location: [facility] CATH LAB;  Service: Cardiovascular;  Laterality: N/A;  . PERCUTANEOUS CORONARY INTERVENTION-BALLOON ONLY  07/02/2011   Procedure: PERCUTANEOUS  CORONARY INTERVENTION-BALLOON ONLY;  Surgeon: Sherren Mocha, MD;  Location: [facility] CATH LAB;  Service: Cardiovascular;;  . PERCUTANEOUS CORONARY INTERVENTION-BALLOON ONLY  07/05/2012   Procedure: PERCUTANEOUS CORONARY INTERVENTION-BALLOON ONLY;  Surgeon: Sherren Mocha, MD;  Location: [facility] CATH LAB;  Service: Cardiovascular;;  . VAGINAL HYSTERECTOMY  ~ 1976   no oophorectomy    Family History Family History  Problem Relation Age of Onset  . Hypertension Mother   . Stroke Mother   . Coronary artery disease Father        CAD in female relative <50  . Heart attack Father   . Heart attack Brother 41  . Heart Problems Sister 92       perminant pacemaker  . Suicidality Son 84       2010  . Healthy Grandchild   . Colon cancer Neg Hx   . Breast cancer Neg Hx   . Stomach cancer Neg Hx   . Rectal cancer Neg Hx   . Esophageal cancer Neg Hx   . Liver cancer Neg Hx     Social History Social History   Socioeconomic History  . Marital status: Widowed    Spouse name: Not on file  . Number of children: 1  . Years of education: Not on file  . Highest education level: Not on file  Occupational History  . Occupation: Retired-2003  Tobacco Use  . Smoking status: Former Smoker    Packs/day: 0.50    Years: 54.00    Pack years: 27.00    Types: Cigarettes    Quit date: 12/15/2013    Years since quitting: 5.7  . Smokeless tobacco: Never Used  . Tobacco comment: quit tobacco 12-2013 after CABG  Substance and Sexual Activity  . Alcohol use: Yes  . Drug use: No  . Sexual activity: Not Currently    Birth control/protection: Surgical  Other Topics Concern  . Not on file  Social History Narrative   G-son his wife/son live w/ her    HC-POA -- sister's daughter    Social Determinants of Health   Financial Resource Strain:   . Difficulty of Paying Living Expenses: Not on file  Food Insecurity:   . Worried About Charity fundraiser in the Last Year: Not on file  . Ran Out of Food in the Last  Year: Not on file  Transportation Needs:   . Lack of Transportation (Medical): Not on file  . Lack of Transportation (Non-Medical): Not on file  Physical Activity:   . Days of Exercise per Week: Not on file  . Minutes of Exercise per Session: Not on file  Stress:   . Feeling of Stress : Not on file  Social Connections:   . Frequency of Communication with Friends and Family: Not on file  . Frequency of Social Gatherings with Friends and Family: Not on file  . Attends Religious Services: Not on file  . Active Member of Clubs or Organizations: Not on file  . Attends Archivist Meetings: Not on file  . Marital Status: Not  on file   Lives with her grandson his wife, and their 55-month-old son  Allergies Allergies  Allergen Reactions  . Ace Inhibitors     cough    Outpatient Meds Home medications from the H+P and/or nursing med reconciliation reviewed.  Inpatient med list reviewed  _____________________________________________________________________ Objective   Exam:  Current vital signs  No data found.  Intake/Output Summary (Last 24 hours) at 09/07/2019 0421 Last data filed at 09/07/2019 0300 Gross per 24 hour  Intake 492.78 ml  Output --  Net 492.78 ml    Physical Exam:    General: this is a elderly female patient in no acute distress.  Alert and conversational  Eyes: sclera anicteric, no redness  ENT: oral mucosa moist without lesions, no cervical or supraclavicular lymphadenopathy  CV: RRR without murmur, S1/S2, no JVD,, no peripheral edema  Resp: clear to auscultation bilaterally, normal RR and effort noted  GI: soft, no tenderness, with active bowel sounds. No guarding or palpable organomegaly noted  Skin; warm and dry, no rash or jaundice noted  Neuro: awake, alert and oriented x 3. Normal gross motor function and fluent speech. Rectal: Perianal exam shows a large fungating anal lesion.  It is causing partial obstruction of the anal outlet.   More extensive exam was not performed due to patient's pain. Labs:  CBC Latest Ref Rng & Units 09/07/2019 09/06/2019 09/06/2019  WBC 4.0 - 10.5 K/uL 5.8 - 6.2  Hemoglobin 12.0 - 15.0 g/dL 12.6 12.8 13.4  Hematocrit 36.0 - 46.0 % 40.1 - 42.2  Platelets 150 - 400 K/uL 215 - 242    CMP Latest Ref Rng & Units 09/06/2019 07/06/2019 05/03/2018  Glucose 70 - 99 mg/dL 95 99 104(H)  BUN 8 - 23 mg/dL 12 12 16   Creatinine 0.44 - 1.00 mg/dL 0.55 0.53 0.58  Sodium 135 - 145 mmol/L 142 141 141  Potassium 3.5 - 5.1 mmol/L 3.4(L) 3.8 4.3  Chloride 98 - 111 mmol/L 106 103 102  CO2 22 - 32 mmol/L 29 31 31   Calcium 8.9 - 10.3 mg/dL 9.2 9.7 9.7  Total Protein 6.0 - 8.3 g/dL - 6.3 6.2  Total Bilirubin 0.2 - 1.2 mg/dL - 0.5 0.3  Alkaline Phos 39 - 117 U/L - 163(H) 160(H)  AST 0 - 37 U/L - 15 16  ALT 0 - 35 U/L - 11 13    Recent Labs  Lab 09/07/19 0351  INR 1.1    Radiologic studies:  CLINICAL DATA:  Rectal mass on physical exam.   EXAM: CT ABDOMEN AND PELVIS WITH CONTRAST   TECHNIQUE: Multidetector CT imaging of the abdomen and pelvis was performed using the standard protocol following bolus administration of intravenous contrast.   CONTRAST:  175mL OMNIPAQUE IOHEXOL 300 MG/ML  SOLN   COMPARISON:  None.   FINDINGS: Lower chest: No acute abnormality.   Hepatobiliary: No focal liver abnormality is seen. Mild diffuse fatty infiltration of the liver parenchyma is noted. No gallstones, gallbladder wall thickening, or biliary dilatation.   Pancreas: Unremarkable. No pancreatic ductal dilatation or surrounding inflammatory changes.   Spleen: Normal in size without focal abnormality.   Adrenals/Urinary Tract: The right adrenal gland is normal in appearance. A 2.8 cm x 2.1 cm heterogeneous low-attenuation left adrenal mass is noted. The right kidney is normal in size and appearance, while the left kidney is mildly decreased in size and slightly malpositioned within the posteromedial  aspect of the mid left abdomen. Mild focal scarring is seen involving the upper  pole of the left kidney. Subcentimeter cysts are seen within the right kidney. A mild amount of calcification is seen along the posterolateral aspect of the left kidney, without evidence of renal calculi or hydronephrosis. Bladder is unremarkable.   Stomach/Bowel: Stomach is within normal limits. The appendix is not clearly identified. No evidence of bowel dilatation. Noninflamed diverticula are seen throughout the sigmoid colon.   A 5.1 cm x 3.2 cm x 4.1 cm complex area of fluid attenuation (approximately 44.34 Hounsfield units) is seen within the posterior perirectal region on the left (axial CT images 79 through 89, CT series number 2). A very thin surrounding hyperdense rim is noted. A very mild amount of associated inflammatory fat stranding is present.   Vascular/Lymphatic: Marked severity aortic atherosclerosis with marked severity calcification of the bilateral common iliac arteries. No enlarged abdominal or pelvic lymph nodes.   Reproductive: Status post hysterectomy. No adnexal masses.   Other: No abdominal wall hernia or abnormality. No abdominopelvic ascites.   Musculoskeletal: Multilevel degenerative changes seen throughout the lumbar spine   IMPRESSION: 1. Complex 5.1 cm x 3.2 cm x 4.1 cm posterior left perirectal fluid collection/abscess. Follow-up to resolution is recommended, as an associated soft tissue component and underlying neoplastic process cannot be excluded. 2. Sigmoid diverticulosis. 3. Low-attenuation left adrenal mass which may represent an adrenal adenoma.     Electronically Signed   By: Virgina Norfolk M.D.   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  It is difficult to determine on exam whether will be feasible to pass a scope through the malignant anal stricture into the distal rectum and allow tissue biopsy.  Also, she has not able to take an enema due to this process, and does not feel she could tolerate an oral bowel preparation. An attempt will be made under sedation at a proctoscopy later today.  If not feasible, surgical consultation will be obtained for exam under anesthesia with biopsy.  She was agreeable to the proctoscopy/sigmoidoscopy after discussion of procedure and risks.  The benefits and risks of the planned procedure were described in detail with the patient or (when appropriate) their health care proxy.  Risks were outlined as including, but not limited to, bleeding, infection, perforation, adverse medication reaction leading to cardiac or pulmonary decompensation, pancreatitis (if ERCP).  The limitation of incomplete mucosal visualization was also discussed.  No guarantees or warranties were given.     Thank you for the courtesy of this consult.  Please contact me with any questions or concerns.  Nelida Meuse III Office: [phone]  ________________________________________________________________________________________  09/07/19 [id number]  Brief Rounding Note:  Patient doing well this morning, tells me she is hungry but knows she is NPO. Tells me the brbpr has slowed down some. She has not had a true BM since Saturday, 4 days ago now. Continue with rectal pain. Aware of plans for exam under anesthesia with hopeful biopsy. We are trying to get this scheduled today. If not able to do this today, will do it tomorrow and patient can be on full liquid diet until  midnight. Patient aware, questions answered.  Anderson Malta  Fabio Asa, PA-C

## 2019-09-07 NOTE — Op Note (Signed)
[facility] Patient Name: Patricia [name] Procedure Date: 09/07/2019 MRN: [id number] Attending MD: Jackquline Denmark , MD Date of Birth: 02/15/1941 CSN: [id number] Age: 79 Admit Type: Inpatient Procedure:                Flexible Sigmoidoscopy Indications:              Rectal hemorrhage, Abnormal CT of the GI tract and                            abn rectal exam. Providers:                Jackquline Denmark, MD, Burtis Junes, RN, Janeece Agee,                            Technician Referring MD:              Medicines:                Monitored Anesthesia Care Complications:            No immediate complications. Estimated Blood Loss:     Estimated blood loss was minimal. Procedure:                Pre-Anesthesia Assessment:                           - Prior to the procedure, a History and Physical                            was performed, and patient medications and                            allergies were reviewed. The patient's tolerance of                            previous anesthesia was also reviewed. The risks                            and benefits of the procedure and the sedation                            options and risks were discussed with the patient.                            All questions were answered, and informed consent                            was obtained. Prior Anticoagulants: The patient has                            taken no previous anticoagulant or antiplatelet                            agents. ASA Grade Assessment: III - A patient with  severe systemic disease. After reviewing the risks                            and benefits, the patient was deemed in                            satisfactory condition to undergo the procedure.                           After obtaining informed consent, the scope was                            passed under direct vision. The [id number]-H190DL                            [id number] Olympus pediatric  colonscope was                            introduced through the anus and advanced to the the                            rectum. The flexible sigmoidoscopy was accomplished                            without difficulty. The patient tolerated the                            procedure well. The quality of the bowel                            preparation was inadequate. Scope In: 12:33:32 PM Scope Out: 12:49:54 PM Total Procedure Duration: 0 hours 16 minutes 21 seconds  Findings:      The exam (digital exam and sigmoidoscopy/proctoscopy) revealed a large 6       cm x 5 cm (diameter) hard, fungating anal mass. The mass was       non-circumferential and located predominantly at the       left-anterior-lateral area and extending to the dentate line with minor       anal stenosis. Multiple biosies were obtained ans sent rush.. Impression:               - Anal mass (biopsied). Moderate Sedation:      none Recommendation:           - Return patient to hospital ward for ongoing care.                           - Resume previous diet.                           - Await pathology results.                           - Would need oncology/XRT consultation after Bx  results.                           - Trend CBC.                           - Would need miralax 17g po qd. Procedure Code(s):        --- Professional ---                           [phone], 23, Sigmoidoscopy, flexible; diagnostic,                            including collection of specimen(s) by brushing or                            washing, when performed (separate procedure) Diagnosis Code(s):        --- Professional ---                           K62.89, Other specified diseases of anus and rectum                           K62.5, Hemorrhage of anus and rectum                           R93.3, Abnormal findings on diagnostic imaging of                            other parts of digestive tract CPT copyright 2019  American Medical Association. All rights reserved. The codes documented in this report are preliminary and upon coder review may  be revised to meet current compliance requirements. Jackquline Denmark, MD 09/07/2019 1:06:08 PM This report has been signed electronically. Number of Addenda: 0

## 2019-09-07 NOTE — Anesthesia Preprocedure Evaluation (Signed)
Anesthesia Evaluation  Patient identified by MRN, date of birth, ID band Patient awake    Reviewed: Allergy & Precautions, H&P , NPO status , Patient's Chart, lab work & pertinent test results  Airway Mallampati: II   Neck ROM: full    Dental   Pulmonary COPD, former smoker,    breath sounds clear to auscultation       Cardiovascular hypertension, + CAD, + CABG and + Peripheral Vascular Disease   Rhythm:regular Rate:Normal     Neuro/Psych PSYCHIATRIC DISORDERS Anxiety Depression    GI/Hepatic   Endo/Other    Renal/GU      Musculoskeletal   Abdominal   Peds  Hematology   Anesthesia Other Findings   Reproductive/Obstetrics                             Anesthesia Physical Anesthesia Plan  ASA: III  Anesthesia Plan: MAC   Post-op Pain Management:    Induction: Intravenous  PONV Risk Score and Plan: 2 and Propofol infusion and Treatment may vary due to age or medical condition  Airway Management Planned: Simple Face Mask  Additional Equipment:   Intra-op Plan:   Post-operative Plan:   Informed Consent: I have reviewed the patients History and Physical, chart, labs and discussed the procedure including the risks, benefits and alternatives for the proposed anesthesia with the patient or authorized representative who has indicated his/her understanding and acceptance.       Plan Discussed with: CRNA, Anesthesiologist and Surgeon  Anesthesia Plan Comments:         Anesthesia Quick Evaluation

## 2019-09-07 NOTE — Anesthesia Procedure Notes (Signed)
Procedure Name: MAC Date/Time: 09/07/2019 12:26 PM Performed by: Maxwell Caul, CRNA Pre-anesthesia Checklist: Patient identified, Emergency Drugs available, Suction available and Patient being monitored Oxygen Delivery Method: Simple face mask

## 2019-09-07 NOTE — Transfer of Care (Signed)
Immediate Anesthesia Transfer of Care Note  Patient: Patricia [name]  Procedure(s) Performed: FLEXIBLE SIGMOIDOSCOPY (N/A ) BIOPSY  Patient Location: PACU and Endoscopy Unit  Anesthesia Type:MAC  Level of Consciousness: awake, alert  and oriented  Airway & Oxygen Therapy: Patient Spontanous Breathing and Patient connected to face mask oxygen  Post-op Assessment: Report given to RN and Post -op Vital signs reviewed and stable  Post vital signs: Reviewed and stable  Last Vitals:  Vitals Value Taken Time  BP    Temp    Pulse    Resp    SpO2      Last Pain:  Vitals:   09/07/19 1113  TempSrc: Oral  PainSc: 0-No pain         Complications: No apparent anesthesia complications

## 2019-09-07 NOTE — Progress Notes (Signed)
Hospitalist progress note   Patient from home, Patient likely will discharge home, disposition likely discharge home with close follow-up and 24 hours on 1/28  RASHAWN BRASIER [id number] DOB: November 09, 1940 DOA: 09/06/2019  PCP: Colon Branch, MD   Narrative:  79 white female 3V CAD PCI DES 07/2010, PCI 06/2011-CABG X3 on 12/15/2013, carotid disease, COPD, thyroid nodule, anxiety and depression, panic attacks-last colonoscopy 07/2016 2 small adenomatous polyps Experienced intermittent bloody stools rectal bleeding-found to have a fungating anal mass with extension into the rectum perirectal space?  Malignancy CT scan showed 5.1 X3.2X 4.1 perirectal lesion, 2.8 X2.1 left adrenal mass Data Reviewed:  Potassium 3.0 total protein 6.0 CBC benign  Assessment & Plan:  Fungating rectal mass with bleeding Prior adenomatous polyps 07/2016 N.p.o. until procedure-GI planning sigmoidoscopy if able later on today Periprocedural diet and further recommendations as per GI Hypokalemia As n.p.o. starting D5, NS saline with KCl 20 and recheck labs a.m. CAD status post CABG 2015 PTA was on amlodipine 10, Cozaar 100 which will be continued. Not currently on aspirin or Plavix and would hold at this time COPD Apparently used to smoke and needed in the past oxygen however not requiring the same currently Inhalers as needed Thyroid nodule Outpatient characterization as as needed Anxiety depression and panic attacks Continue Prozac 40 daily, Klonopin 0.25 at bedtime as needed  No family present at bedside-SCDs at this time  Subjective: Doing fair Seems to understand plan of sigmoidoscopy and potential biopsy  Consultants:   Gastroenterology  Objective: Vitals:   09/06/19 1615 09/06/19 1809 09/06/19 1937 09/07/19 0425  BP: (!) 161/64 (!) 146/80 (!) 172/61 (!) 160/60  Pulse: 77 90 83 74  Resp: 16 17 16 16   Temp:   98.4 F (36.9 C) 98 F (36.7 C)  TempSrc:   Oral Oral  SpO2: 94% 91% 95% 97%  Weight:       Height:        Intake/Output Summary (Last 24 hours) at 09/07/2019 0756 Last data filed at 09/07/2019 0300 Gross per 24 hour  Intake 492.78 ml  Output --  Net 492.78 ml   Filed Weights   09/06/19 1345  Weight: 58 kg    Examination:  Awake alert coherent poor dentition EOMI NCAT no focal deficit S1-S2 no murmur rub or gallop Chest is clear without added sound no rales no rhonchi Abdomen soft Rectum not examined No lower extremity edema  Scheduled Meds: . amLODipine  10 mg Oral Daily  . atorvastatin  40 mg Oral Daily  . cholecalciferol  1,000 Units Oral Daily  . FLUoxetine  40 mg Oral Daily  . losartan  100 mg Oral Daily   Continuous Infusions: . sodium chloride 100 mL/hr at 09/07/19 0300     LOS: 1 day   Time spent: [place], MD [facility]  09/07/2019, 7:56 AM

## 2019-09-07 NOTE — Interval H&P Note (Signed)
History and Physical Interval Note:  09/07/2019 12:13 PM  Patricia [name]  has presented today for surgery, with the diagnosis of anal mass.  The various methods of treatment have been discussed with the patient and family. After consideration of risks, benefits and other options for treatment, the patient has consented to  Procedure(s): FLEXIBLE SIGMOIDOSCOPY (N/A) as a surgical intervention.  The patient's history has been reviewed, patient examined, no change in status, stable for surgery.  I have reviewed the patient's chart and labs.  Questions were answered to the patient's satisfaction.     Jackquline Denmark

## 2019-09-07 NOTE — Consult Note (Addendum)
[facility] Consult Note   History Patricia [name] MRN # [id number]  Date of Admission: 09/06/2019 Date of Consultation: 09/07/2019 Referring physician: Dr. Phineas Semen, MD Primary Care Provider: Colon Branch, MD Primary Gastroenterologist: Dr. Scarlette Shorts   Reason for Consultation/Chief Complaint: Anal pain and bleeding ,anal mass  Subjective  HPI:  This is a very pleasant 79 year old woman who came to [place] earlier today with escalating anal pain bleeding and constipation that been going on for a few weeks.  She thought this was hemorrhoids, then got concerned something more may be going on due to ongoing symptoms.  She has not had a bowel movement in a few days, and even then it was only small volume.  She has pain in the anal rectal area at rest, sitting, and says it is agonizing to try to pass a bowel movement. On exam admit center [place] earlier today was found to have a fungating anal mass, CT scan suggesting extension into the proximal rectum and perirectal space worrisome for malignancy.  She was transferred to the [facility] long ED and admitted to the medical service.  She denies abdominal pain, says her appetite is dropped off recently, no nausea or vomiting.  Last colonoscopy with Dr. Henrene Pastor December 2017 with 2 small adenomatous polyps. ROS:  Constitutional: May be some recent weight loss, she is uncertain   All other systems are negative except as noted above in the HPI  Past Medical History Past Medical History:  Diagnosis Date  . Abnormal LFTs   . Allergic rhinitis   . Anxiety and depression    son commited suicide 2010  . 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 CAD (coronary artery disease)    s/p CABG  . Carotid artery disease ([place])    Moderate - f/u dopplers needed 11/2012  . 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 Zadie Rhine  . COPD (chronic obstructive pulmonary disease) ([place])    . HTN (hypertension)    intolerant to ACEi (cough)  . Hyperlipidemia   . Osteopenia   . PAD (peripheral artery disease) ([place])    Carotid dz as  below, and at [facility] 11/12:  critical stenosis noted just below the sheath site leading into a totally occluded SFA and a patent deep femoral artery  . Panic attacks   . Thyroid nodule     Past Surgical History Past Surgical History:  Procedure Laterality Date  . CORONARY ANGIOPLASTY WITH STENT PLACEMENT     "1; makes total of 3" (07/05/2012)  . CORONARY ARTERY BYPASS GRAFT N/A 12/15/2013   Procedure: CORONARY ARTERY BYPASS GRAFTING (CABG);  Surgeon: Gaye Pollack, MD;  Location: [place];  Service: Open Heart Surgery;  Laterality: N/A;  Times 3 using left internal mammary artery and endoscopically harvested right saphenous vein   . INTRAOPERATIVE TRANSESOPHAGEAL ECHOCARDIOGRAM N/A 12/15/2013   Procedure: INTRAOPERATIVE TRANSESOPHAGEAL ECHOCARDIOGRAM;  Surgeon: Gaye Pollack, MD;  Location: [facility] OR;  Service: Open Heart Surgery;  Laterality: N/A;  . 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 LEFT HEART CATHETERIZATION WITH CORONARY ANGIOGRAM N/A 07/02/2011   Procedure: LEFT HEART CATHETERIZATION WITH CORONARY ANGIOGRAM;  Surgeon: Sherren Mocha, MD;  Location: [facility] CATH LAB;  Service: Cardiovascular;  Laterality: N/A;  . LEFT HEART CATHETERIZATION WITH CORONARY ANGIOGRAM  N/A 07/05/2012   Procedure: LEFT HEART CATHETERIZATION WITH CORONARY ANGIOGRAM;  Surgeon: Sherren Mocha, MD;  Location: [facility] CATH LAB;  Service: Cardiovascular;  Laterality: N/A;  . LEFT HEART CATHETERIZATION WITH CORONARY ANGIOGRAM N/A 11/28/2013   Procedure: LEFT HEART CATHETERIZATION WITH CORONARY ANGIOGRAM;  Surgeon: Blane Ohara, MD;  Location: [facility] CATH LAB;  Service: Cardiovascular;  Laterality: N/A;  . PERCUTANEOUS CORONARY INTERVENTION-BALLOON ONLY  07/02/2011   Procedure: PERCUTANEOUS  CORONARY INTERVENTION-BALLOON ONLY;  Surgeon: Sherren Mocha, MD;  Location: [facility] CATH LAB;  Service: Cardiovascular;;  . PERCUTANEOUS CORONARY INTERVENTION-BALLOON ONLY  07/05/2012   Procedure: PERCUTANEOUS CORONARY INTERVENTION-BALLOON ONLY;  Surgeon: Sherren Mocha, MD;  Location: [facility] CATH LAB;  Service: Cardiovascular;;  . VAGINAL HYSTERECTOMY  ~ 1976   no oophorectomy    Family History Family History  Problem Relation Age of Onset  . Hypertension Mother   . Stroke Mother   . Coronary artery disease Father        CAD in female relative <50  . Heart attack Father   . Heart attack Brother 15  . Heart Problems Sister 37       perminant pacemaker  . Suicidality Son 69       2010  . Healthy Grandchild   . Colon cancer Neg Hx   . Breast cancer Neg Hx   . Stomach cancer Neg Hx   . Rectal cancer Neg Hx   . Esophageal cancer Neg Hx   . Liver cancer Neg Hx     Social History Social History   Socioeconomic History  . Marital status: Widowed    Spouse name: Not on file  . Number of children: 1  . Years of education: Not on file  . Highest education level: Not on file  Occupational History  . Occupation: Retired-2003  Tobacco Use  . Smoking status: Former Smoker    Packs/day: 0.50    Years: 54.00    Pack years: 27.00    Types: Cigarettes    Quit date: 12/15/2013    Years since quitting: 5.7  . Smokeless tobacco: Never Used  . Tobacco comment: quit tobacco 12-2013 after CABG  Substance and Sexual Activity  . Alcohol use: Yes  . Drug use: No  . Sexual activity: Not Currently    Birth control/protection: Surgical  Other Topics Concern  . Not on file  Social History Narrative   G-son his wife/son live w/ her    HC-POA -- sister's daughter    Social Determinants of Health   Financial Resource Strain:   . Difficulty of Paying Living Expenses: Not on file  Food Insecurity:   . Worried About Charity fundraiser in the Last Year: Not on file  . Ran Out of Food in the Last  Year: Not on file  Transportation Needs:   . Lack of Transportation (Medical): Not on file  . Lack of Transportation (Non-Medical): Not on file  Physical Activity:   . Days of Exercise per Week: Not on file  . Minutes of Exercise per Session: Not on file  Stress:   . Feeling of Stress : Not on file  Social Connections:   . Frequency of Communication with Friends and Family: Not on file  . Frequency of Social Gatherings with Friends and Family: Not on file  . Attends Religious Services: Not on file  . Active Member of Clubs or Organizations: Not on file  . Attends Archivist Meetings: Not on file  . Marital Status: Not  on file   Lives with her grandson his wife, and their 64-month-old son  Allergies Allergies  Allergen Reactions  . Ace Inhibitors     cough    Outpatient Meds Home medications from the H+P and/or nursing med reconciliation reviewed.  Inpatient med list reviewed  _____________________________________________________________________ Objective   Exam:  Current vital signs  No data found.  Intake/Output Summary (Last 24 hours) at 09/07/2019 0421 Last data filed at 09/07/2019 0300 Gross per 24 hour  Intake 492.78 ml  Output --  Net 492.78 ml    Physical Exam:    General: this is a elderly female patient in no acute distress.  Alert and conversational  Eyes: sclera anicteric, no redness  ENT: oral mucosa moist without lesions, no cervical or supraclavicular lymphadenopathy  CV: RRR without murmur, S1/S2, no JVD,, no peripheral edema  Resp: clear to auscultation bilaterally, normal RR and effort noted  GI: soft, no tenderness, with active bowel sounds. No guarding or palpable organomegaly noted  Skin; warm and dry, no rash or jaundice noted  Neuro: awake, alert and oriented x 3. Normal gross motor function and fluent speech. Rectal: Perianal exam shows a large fungating anal lesion.  It is causing partial obstruction of the anal outlet.   More extensive exam was not performed due to patient's pain. Labs:  CBC Latest Ref Rng & Units 09/07/2019 09/06/2019 09/06/2019  WBC 4.0 - 10.5 K/uL 5.8 - 6.2  Hemoglobin 12.0 - 15.0 g/dL 12.6 12.8 13.4  Hematocrit 36.0 - 46.0 % 40.1 - 42.2  Platelets 150 - 400 K/uL 215 - 242    CMP Latest Ref Rng & Units 09/06/2019 07/06/2019 05/03/2018  Glucose 70 - 99 mg/dL 95 99 104(H)  BUN 8 - 23 mg/dL 12 12 16   Creatinine 0.44 - 1.00 mg/dL 0.55 0.53 0.58  Sodium 135 - 145 mmol/L 142 141 141  Potassium 3.5 - 5.1 mmol/L 3.4(L) 3.8 4.3  Chloride 98 - 111 mmol/L 106 103 102  CO2 22 - 32 mmol/L 29 31 31   Calcium 8.9 - 10.3 mg/dL 9.2 9.7 9.7  Total Protein 6.0 - 8.3 g/dL - 6.3 6.2  Total Bilirubin 0.2 - 1.2 mg/dL - 0.5 0.3  Alkaline Phos 39 - 117 U/L - 163(H) 160(H)  AST 0 - 37 U/L - 15 16  ALT 0 - 35 U/L - 11 13    Recent Labs  Lab 09/07/19 0351  INR 1.1    Radiologic studies:  CLINICAL DATA:  Rectal mass on physical exam.   EXAM: CT ABDOMEN AND PELVIS WITH CONTRAST   TECHNIQUE: Multidetector CT imaging of the abdomen and pelvis was performed using the standard protocol following bolus administration of intravenous contrast.   CONTRAST:  165mL OMNIPAQUE IOHEXOL 300 MG/ML  SOLN   COMPARISON:  None.   FINDINGS: Lower chest: No acute abnormality.   Hepatobiliary: No focal liver abnormality is seen. Mild diffuse fatty infiltration of the liver parenchyma is noted. No gallstones, gallbladder wall thickening, or biliary dilatation.   Pancreas: Unremarkable. No pancreatic ductal dilatation or surrounding inflammatory changes.   Spleen: Normal in size without focal abnormality.   Adrenals/Urinary Tract: The right adrenal gland is normal in appearance. A 2.8 cm x 2.1 cm heterogeneous low-attenuation left adrenal mass is noted. The right kidney is normal in size and appearance, while the left kidney is mildly decreased in size and slightly malpositioned within the posteromedial  aspect of the mid left abdomen. Mild focal scarring is seen involving the upper  pole of the left kidney. Subcentimeter cysts are seen within the right kidney. A mild amount of calcification is seen along the posterolateral aspect of the left kidney, without evidence of renal calculi or hydronephrosis. Bladder is unremarkable.   Stomach/Bowel: Stomach is within normal limits. The appendix is not clearly identified. No evidence of bowel dilatation. Noninflamed diverticula are seen throughout the sigmoid colon.   A 5.1 cm x 3.2 cm x 4.1 cm complex area of fluid attenuation (approximately 44.34 Hounsfield units) is seen within the posterior perirectal region on the left (axial CT images 79 through 89, CT series number 2). A very thin surrounding hyperdense rim is noted. A very mild amount of associated inflammatory fat stranding is present.   Vascular/Lymphatic: Marked severity aortic atherosclerosis with marked severity calcification of the bilateral common iliac arteries. No enlarged abdominal or pelvic lymph nodes.   Reproductive: Status post hysterectomy. No adnexal masses.   Other: No abdominal wall hernia or abnormality. No abdominopelvic ascites.   Musculoskeletal: Multilevel degenerative changes seen throughout the lumbar spine   IMPRESSION: 1. Complex 5.1 cm x 3.2 cm x 4.1 cm posterior left perirectal fluid collection/abscess. Follow-up to resolution is recommended, as an associated soft tissue component and underlying neoplastic process cannot be excluded. 2. Sigmoid diverticulosis. 3. Low-attenuation left adrenal mass which may represent an adrenal adenoma.     Electronically Signed   By: Virgina Norfolk M.D.   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  It is difficult to determine on exam whether will be feasible to pass a scope through the malignant anal stricture into the distal rectum and allow tissue biopsy.  Also, she has not able to take an enema due to this process, and does not feel she could tolerate an oral bowel preparation. An attempt will be made under sedation at a proctoscopy later today.  If not feasible, surgical consultation will be obtained for exam under anesthesia with biopsy.  She was agreeable to the proctoscopy/sigmoidoscopy after discussion of procedure and risks.  The benefits and risks of the planned procedure were described in detail with the patient or (when appropriate) their health care proxy.  Risks were outlined as including, but not limited to, bleeding, infection, perforation, adverse medication reaction leading to cardiac or pulmonary decompensation, pancreatitis (if ERCP).  The limitation of incomplete mucosal visualization was also discussed.  No guarantees or warranties were given.     Thank you for the courtesy of this consult.  Please contact me with any questions or concerns.  Nelida Meuse III Office: [phone]  ________________________________________________________________________________________  09/07/19 [id number]  Brief Rounding Note:  Patient doing well this morning, tells me she is hungry but knows she is NPO. Tells me the brbpr has slowed down some. She has not had a true BM since Saturday, 4 days ago now. Continue with rectal pain. Aware of plans for exam under anesthesia with hopeful biopsy. We are trying to get this scheduled today. If not able to do this today, will do it tomorrow and patient can be on full liquid diet until  midnight. Patient aware, questions answered.  Anderson Malta  Fabio Asa, PA-C

## 2019-09-08 ENCOUNTER — Other Ambulatory Visit: Payer: Self-pay | Admitting: Oncology

## 2019-09-08 ENCOUNTER — Encounter: Payer: Self-pay | Admitting: *Deleted

## 2019-09-08 DIAGNOSIS — K5900 Constipation, unspecified: Secondary | ICD-10-CM

## 2019-09-08 DIAGNOSIS — K629 Disease of anus and rectum, unspecified: Secondary | ICD-10-CM

## 2019-09-08 LAB — SURGICAL PATHOLOGY

## 2019-09-08 LAB — CBC WITH DIFFERENTIAL/PLATELET
Abs Immature Granulocytes: 0.02 10*3/uL (ref 0.00–0.07)
Basophils Absolute: 0 10*3/uL (ref 0.0–0.1)
Basophils Relative: 1 %
Eosinophils Absolute: 0.2 10*3/uL (ref 0.0–0.5)
Eosinophils Relative: 4 %
HCT: 39.3 % (ref 36.0–46.0)
Hemoglobin: 12.5 g/dL (ref 12.0–15.0)
Immature Granulocytes: 0 %
Lymphocytes Relative: 20 %
Lymphs Abs: 1.1 10*3/uL (ref 0.7–4.0)
MCH: 29.2 pg (ref 26.0–34.0)
MCHC: 31.8 g/dL (ref 30.0–36.0)
MCV: 91.8 fL (ref 80.0–100.0)
Monocytes Absolute: 0.7 10*3/uL (ref 0.1–1.0)
Monocytes Relative: 11 %
Neutro Abs: 3.7 10*3/uL (ref 1.7–7.7)
Neutrophils Relative %: 64 %
Platelets: 212 10*3/uL (ref 150–400)
RBC: 4.28 MIL/uL (ref 3.87–5.11)
RDW: 13.4 % (ref 11.5–15.5)
WBC: 5.8 10*3/uL (ref 4.0–10.5)
nRBC: 0 % (ref 0.0–0.2)

## 2019-09-08 LAB — BASIC METABOLIC PANEL
Anion gap: 6 (ref 5–15)
BUN: 5 mg/dL — ABNORMAL LOW (ref 8–23)
CO2: 28 mmol/L (ref 22–32)
Calcium: 8.7 mg/dL — ABNORMAL LOW (ref 8.9–10.3)
Chloride: 107 mmol/L (ref 98–111)
Creatinine, Ser: 0.4 mg/dL — ABNORMAL LOW (ref 0.44–1.00)
GFR calc Af Amer: >60 mL/min (ref >60)
GFR calc non Af Amer: >60 mL/min (ref >60)
Glucose, Bld: 113 mg/dL — ABNORMAL HIGH (ref 70–99)
Potassium: 3.2 mmol/L — ABNORMAL LOW (ref 3.5–5.1)
Sodium: 141 mmol/L (ref 135–145)

## 2019-09-08 MED ORDER — TRAMADOL HCL 50 MG PO TABS: 50.0000 mg | ORAL_TABLET | Freq: Four times a day (QID) | ORAL | 0 refills | Status: DC | PRN

## 2019-09-08 NOTE — Progress Notes (Addendum)
Progress Note   Subjective  Chief Complaint: Anal pain and bleeding, anal mass  Status post flex sigmoidoscopy yesterday, anal mass with biopsies.  Biopsies pending  Today, patient tells me that she is passing stool it is much softer and even slightly liquid, she is happy that she is able to pass stool, continues with small amount of anal discomfort but denies any new complaints or concerns.  Tells me that she is going to start the discussion with her family in regards to this.  Explains that she is okay to go home.   Objective   Vital signs in last 24 hours: Temp:  [97.7 F (36.5 C)-98.4 F (36.9 C)] 98.4 F (36.9 C) (01/27 2011) Pulse Rate:  [77-89] 89 (01/27 2011) Resp:  [13-22] 16 (01/27 2011) BP: (139-209)/(43-65) 154/65 (01/27 2011) SpO2:  [90 %-98 %] 91 % (01/27 2011) Weight:  [58 kg] 58 kg (01/27 1113) Last BM Date: 09/06/19 General:    Elderly white female in NAD Heart:  Regular rate and rhythm; no murmurs Lungs: Respirations even and unlabored, lungs CTA bilaterally Abdomen:  Soft, nontender and nondistended. Normal bowel sounds. Extremities:  Without edema. Neurologic:  Alert and oriented,  grossly normal neurologically. Psych:  Cooperative. Normal mood and affect.  Intake/Output from previous day: 01/27 0701 - 01/28 0700 In: 992 [I.V.:992] Out: -   Lab Results: Recent Labs    09/06/19 1439 09/06/19 2049 09/07/19 0351 09/07/19 0844 09/08/19 0447  WBC 6.2  --  5.8  --  5.8  HGB 13.4   < > 12.6 14.2 12.5  HCT 42.2  --  40.1  --  39.3  PLT 242  --  215  --  212   < > = values in this interval not displayed.   BMET Recent Labs    09/06/19 1439 09/07/19 0351 09/08/19 0447  NA 142 143 141  K 3.4* 3.0* 3.2*  CL 106 106 107  CO2 29 29 28   GLUCOSE 95 87 113*  BUN 12 9 5*  CREATININE 0.55 0.54 0.40*  CALCIUM 9.2 8.7* 8.7*   LFT Recent Labs    09/07/19 0351  PROT 6.0*  ALBUMIN 3.3*  AST 26  ALT 23  ALKPHOS 120  BILITOT 0.6    PT/INR Recent Labs    09/07/19 0351  LABPROT 14.5  INR 1.1    Studies/Results: CT ABDOMEN PELVIS W CONTRAST  Result Date: 09/06/2019 CLINICAL DATA:  Rectal mass on physical exam. EXAM: CT ABDOMEN AND PELVIS WITH CONTRAST TECHNIQUE: Multidetector CT imaging of the abdomen and pelvis was performed using the standard protocol following bolus administration of intravenous contrast. CONTRAST:  178mL OMNIPAQUE IOHEXOL 300 MG/ML  SOLN COMPARISON:  None. FINDINGS: Lower chest: No acute abnormality. Hepatobiliary: No focal liver abnormality is seen. Mild diffuse fatty infiltration of the liver parenchyma is noted. No gallstones, gallbladder wall thickening, or biliary dilatation. Pancreas: Unremarkable. No pancreatic ductal dilatation or surrounding inflammatory changes. Spleen: Normal in size without focal abnormality. Adrenals/Urinary Tract: The right adrenal gland is normal in appearance. A 2.8 cm x 2.1 cm heterogeneous low-attenuation left adrenal mass is noted. The right kidney is normal in size and appearance, while the left kidney is mildly decreased in size and slightly malpositioned within the posteromedial aspect of the mid left abdomen. Mild focal scarring is seen involving the upper pole of the left kidney. Subcentimeter cysts are seen within the right kidney. A mild amount of calcification is seen along the posterolateral aspect of the  left kidney, without evidence of renal calculi or hydronephrosis. Bladder is unremarkable. Stomach/Bowel: Stomach is within normal limits. The appendix is not clearly identified. No evidence of bowel dilatation. Noninflamed diverticula are seen throughout the sigmoid colon. A 5.1 cm x 3.2 cm x 4.1 cm complex area of fluid attenuation (approximately 44.34 Hounsfield units) is seen within the posterior perirectal region on the left (axial CT images 79 through 89, CT series number 2). A very thin surrounding hyperdense rim is noted. A very mild amount of associated  inflammatory fat stranding is present. Vascular/Lymphatic: Marked severity aortic atherosclerosis with marked severity calcification of the bilateral common iliac arteries. No enlarged abdominal or pelvic lymph nodes. Reproductive: Status post hysterectomy. No adnexal masses. Other: No abdominal wall hernia or abnormality. No abdominopelvic ascites. Musculoskeletal: Multilevel degenerative changes seen throughout the lumbar spine IMPRESSION: 1. Complex 5.1 cm x 3.2 cm x 4.1 cm posterior left perirectal fluid collection/abscess. Follow-up to resolution is recommended, as an associated soft tissue component and underlying neoplastic process cannot be excluded. 2. Sigmoid diverticulosis. 3. Low-attenuation left adrenal mass which may represent an adrenal adenoma. Electronically Signed   By: Virgina Norfolk M.D.   On: 09/06/2019 15:42   Flex sigmoidoscopy, Dr. Lyndel Safe 09/07/2019: Impression:               - Anal mass (biopsied).  Recommendation:           - Return patient to hospital ward for ongoing care.                           - Resume previous diet.                           - Await pathology results.                           - Would need oncology/XRT consultation after Bx                            results.                           - Trend CBC.                           - Would need miralax 17g po qd.   Assessment / Plan:   Assessment: 1.  Anal mass: Status post flex sigmoidoscopy 09/07/2019, biopsies pending  Plan: 1.  Continue MiraLAX daily 2.  Okay with patient discharge home, she will need follow-up with oncology/XRT after biopsy results are received  Thank you for your kind consultation.  We will sign off.    LOS: 2 days   Levin Erp  09/08/2019, 9:33 AM  GI ATTENDING  Interval history data reviewed.  Agree with interval progress note as outlined.  Anal mass biopsies pending.  Patient stable for discharge.  She will follow up with oncology.  We will sign off.  Docia Chuck.  Geri Seminole., M.D. [facility] Division of Gastroenterology

## 2019-09-08 NOTE — Discharge Summary (Signed)
Physician Discharge Summary  Patricia [name] [id number] DOB: 14-Oct-1940 DOA: 09/06/2019  PCP: Patricia Branch, MD  Admit date: 09/06/2019 Discharge date: 09/08/2019  Time spent: 30 minutes  Recommendations for Outpatient Follow-up:  1. Recommend outpatient follow-up with oncology-we will have them call you within the next week or so once biopsy results are available to ensure that you have a follow-up--- I have CCed also Patricia [name] of gastroenterology to make sure that this is not missed 2. Would recommend labs in about 1 week 3. 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  Malignancy CT scan showed 5.1 X3.2X 4.1 perirectal lesion, 2.8 X2.1 left adrenal mass  Hospital Course:  Fungating rectal mass with bleeding Prior adenomatous polyps 07/2016 N.p.o. until procedure-GI planning sigmoidoscopy if able later on today Periprocedural diet and further recommendations as per GI Hypokalemia As n.p.o. starting D5, NS saline with KCl 20 and recheck labs a.m. CAD status post CABG 2015 PTA was on amlodipine 10, Cozaar 100 which will be continued. Not currently on aspirin or Plavix and would hold at this time COPD Apparently used to smoke and needed in the past oxygen however not requiring the same currently Inhalers as  needed Thyroid nodule Outpatient characterization as as needed Anxiety depression and panic attacks Continue Prozac 40 daily, Klonopin 0.25 at bedtime as needed  Procedures: Sigmoidoscopy performed 1/27 =Findings:      The exam (digital exam and sigmoidoscopy/proctoscopy) revealed a large 6       cm x 5 cm (diameter) hard, fungating anal mass. The mass was       non-circumferential and located predominantly at the       left-anterior-lateral area and extending to the dentate line with minor       anal stenosis. Multiple biosies were obtained ans sent rush.. Impression:               - Anal mass  Consultations:  Gastroenterology  Discharge Exam: Vitals:   09/07/19 1330 09/07/19 2011  BP: (!) 194/56 (!) 154/65  Pulse: 79 89  Resp: 17 16  Temp:  98.4 F (36.9 C)  SpO2: 93% 91%    General: Awake alert coherent no distress Cardiovascular: S1-S2 no murmur rub or gallop Respiratory: Clinically clear no added sound no rales no rhonchi Abdomen soft no rebound no guarding Anal mass not inspected Neurologically intact no focal deficit  Discharge Instructions   Discharge Instructions    Diet - low sodium heart healthy   Complete by: As directed    Discharge instructions   Complete by: As directed    Follow-up with oncology as indicated once we get your biopsies back-they should call you and ensure that you have a follow-up in the outpatient setting in the next week or so Make sure that you take MiraLAX that we have prescribed to keep your stools loose  and ensure that if you feel any symptoms of obstruction and or you are not able to pass stool for over 48 hours that you report this to your regular physician Stop taking Naprosyn as this may cause bleeding Careful use of tramadol for pain as this can also cause constipation You will get labs probably at your oncology office   Increase activity slowly   Complete by: As directed      Allergies as of 09/08/2019      Reactions    Ace Inhibitors    cough      Medication List    STOP taking these medications   naproxen sodium 220 MG tablet Commonly known as: ALEVE     TAKE these medications   amLODipine 10 MG tablet Commonly known as: NORVASC Take 1 tablet (10 mg total) by mouth daily.   atorvastatin 40 MG tablet Commonly known as: LIPITOR Take 1 tablet (40 mg total) by mouth daily.   budesonide-formoterol 160-4.5 MCG/ACT inhaler Commonly known as: SYMBICORT Inhale 2 puffs into the lungs 2 (two) times daily. What changed:   when to take this  reasons to take this   clonazePAM 0.5 MG tablet Commonly known as: KLONOPIN TAKE 1 TABLET BY MOUTH IN THE MORNING AS NEEDED FOR ANXIETY AND 1 & 1/2 TO 2 (ONE & ONE-HALF TO TWO) AT BEDTIME AS NEEDED FOR SLEEP What changed:   how much to take  how to take this  when to take this  reasons to take this  additional instructions   FLUoxetine 20 MG capsule Commonly known as: PROZAC Take 2 capsules (40 mg total) by mouth daily.   losartan 100 MG tablet Commonly known as: COZAAR Take 1 tablet by mouth once daily   traMADol 50 MG tablet Commonly known as: ULTRAM Take 1 tablet (50 mg total) by mouth every 6 (six) hours as needed for moderate pain or severe pain. What changed: reasons to take this   Vitamin D3 25 MCG (1000 UT) Caps Take 1,000 Units by mouth daily.      Allergies  Allergen Reactions  . Ace Inhibitors     cough      The results of significant diagnostics from this hospitalization (including imaging, microbiology, ancillary and laboratory) are listed below for reference.    Significant Diagnostic Studies: DG Ankle Complete Left  Result Date: 08/22/2019 CLINICAL DATA:  Lateral foot and ankle pain.  No injury EXAM: LEFT ANKLE COMPLETE - 3+ VIEW COMPARISON:  None. FINDINGS: There is no evidence of fracture, dislocation, or joint effusion. There is no evidence of arthropathy or other focal bone abnormality. Soft tissues are  unremarkable. Mild atherosclerotic calcification. IMPRESSION: Negative. Electronically Signed   By: Patricia [name] M.D.   On: 08/22/2019 14:08   CT ABDOMEN PELVIS W CONTRAST  Result Date: 09/06/2019 CLINICAL DATA:  Rectal mass on physical exam. EXAM: CT ABDOMEN AND PELVIS WITH CONTRAST TECHNIQUE: Multidetector CT imaging of the abdomen and pelvis was performed using the standard protocol following bolus administration of intravenous contrast. CONTRAST:  147mL OMNIPAQUE IOHEXOL 300 MG/ML  SOLN COMPARISON:  None. FINDINGS: Lower chest: No acute abnormality. Hepatobiliary: No focal liver abnormality is seen. Mild diffuse fatty infiltration of the liver parenchyma is noted. No gallstones, gallbladder wall thickening, or biliary dilatation. Pancreas: Unremarkable. No pancreatic ductal dilatation or surrounding inflammatory changes. Spleen: Normal in size without focal abnormality. Adrenals/Urinary Tract: The right adrenal gland is normal in appearance. A 2.8 cm x 2.1 cm heterogeneous low-attenuation left adrenal  mass is noted. The right kidney is normal in size and appearance, while the left kidney is mildly decreased in size and slightly malpositioned within the posteromedial aspect of the mid left abdomen. Mild focal scarring is seen involving the upper pole of the left kidney. Subcentimeter cysts are seen within the right kidney. A mild amount of calcification is seen along the posterolateral aspect of the left kidney, without evidence of renal calculi or hydronephrosis. Bladder is unremarkable. Stomach/Bowel: Stomach is within normal limits. The appendix is not clearly identified. No evidence of bowel dilatation. Noninflamed diverticula are seen throughout the sigmoid Patricia. A 5.1 cm x 3.2 cm x 4.1 cm complex area of fluid attenuation (approximately 44.34 Hounsfield units) is seen within the posterior perirectal region on the left (axial CT images 79 through 89, CT series number 2). A very thin surrounding  hyperdense rim is noted. A very mild amount of associated inflammatory fat stranding is present. Vascular/Lymphatic: Marked severity aortic atherosclerosis with marked severity calcification of the bilateral common iliac arteries. No enlarged abdominal or pelvic lymph nodes. Reproductive: Status post hysterectomy. No adnexal masses. Other: No abdominal wall hernia or abnormality. No abdominopelvic ascites. Musculoskeletal: Multilevel degenerative changes seen throughout the lumbar spine IMPRESSION: 1. Complex 5.1 cm x 3.2 cm x 4.1 cm posterior left perirectal fluid collection/abscess. Follow-up to resolution is recommended, as an associated soft tissue component and underlying neoplastic process cannot be excluded. 2. Sigmoid diverticulosis. 3. Low-attenuation left adrenal mass which may represent an adrenal adenoma. Electronically Signed   By: Virgina Norfolk M.D.   On: 09/06/2019 15:42   DG Foot Complete Left  Result Date: 08/22/2019 CLINICAL DATA:  Lateral foot and ankle pain.  No injury EXAM: LEFT FOOT - COMPLETE 3+ VIEW COMPARISON:  None. FINDINGS: There is no evidence of fracture or dislocation. There is no evidence of arthropathy or other focal bone abnormality. Soft tissues are unremarkable. IMPRESSION: Negative. Electronically Signed   By: Patricia [name] M.D.   On: 08/22/2019 14:09    Microbiology: Recent Results (from the past 240 hour(s))  SARS Coronavirus 2 by RT PCR (hospital order, performed in [facility] hospital lab) Nasopharyngeal Nasopharyngeal Swab     Status: None   Collection Time: 09/06/19  5:20 PM   Specimen: Nasopharyngeal Swab  Result Value Ref Range Status   SARS Coronavirus 2 NEGATIVE NEGATIVE Final    Comment: Performed at [facility], [address] [place], [place] [address]     Labs: Basic Metabolic Panel: Recent Labs  Lab 09/06/19 1439 09/07/19 0351 09/08/19 0447  NA 142 143 141  K 3.4* 3.0* 3.2*  CL 106 106 107  CO2 29 29 28   GLUCOSE 95 87  113*  BUN 12 9 5*  CREATININE 0.55 0.54 0.40*  CALCIUM 9.2 8.7* 8.7*   Liver Function Tests: Recent Labs  Lab 09/07/19 0351  AST 26  ALT 23  ALKPHOS 120  BILITOT 0.6  PROT 6.0*  ALBUMIN 3.3*   No results for input(s): LIPASE, AMYLASE in the last 168 hours. No results for input(s): AMMONIA in the last 168 hours. CBC: Recent Labs  Lab 09/06/19 1439 09/06/19 2049 09/07/19 0351 09/07/19 0844 09/08/19 0447  WBC 6.2  --  5.8  --  5.8  NEUTROABS 4.4  --   --   --  3.7  HGB 13.4 12.8 12.6 14.2 12.5  HCT 42.2  --  40.1  --  39.3  MCV 91.9  --  92.4  --  91.8  PLT 242  --  215  --  212   Cardiac Enzymes: No results for input(s): CKTOTAL, CKMB, CKMBINDEX, TROPONINI in the last 168 hours. BNP: BNP (last 3 results) No results for input(s): BNP in the last 8760 hours.  ProBNP (last 3 results) No results for input(s): PROBNP in the last 8760 hours.  CBG: No results for input(s): GLUCAP in the last 168 hours.     Signed:  Nita Sells MD   [facility] 09/08/2019, 9:09 AM

## 2019-09-08 NOTE — Progress Notes (Signed)
Bre, Bx from anal mass: Invasive moderately differentiated squamous cell carcinoma.  I have tried calling patient twice. No answer. Will call again tomorrow morning. Please make sure that she has appointment with oncology. From the notes it seems she does.  RG  Dr Henrene Pastor pt- will forward him biopsy report as well.

## 2019-09-09 ENCOUNTER — Ambulatory Visit: Payer: Medicare Other | Admitting: Gastroenterology

## 2019-09-09 ENCOUNTER — Telehealth: Payer: Self-pay | Admitting: Nurse Practitioner

## 2019-09-09 NOTE — Telephone Encounter (Signed)
Ms. Volkers returned my call and confirmed appt w/Lisa on 2/3 at 1:45pm.

## 2019-09-09 NOTE — Telephone Encounter (Signed)
A new pt appt has been scheduled for Mr. Patricia [name] to see Ned Card on 2/3 at 1:45pm. I cld and lft the appt date and time on the pt 's vm.

## 2019-09-13 NOTE — Anesthesia Postprocedure Evaluation (Signed)
Anesthesia Post Note  Patient: Patricia [name]  Procedure(s) Performed: FLEXIBLE SIGMOIDOSCOPY (N/A ) BIOPSY     Patient location during evaluation: Endoscopy Anesthesia Type: MAC Level of consciousness: awake and alert Pain management: pain level controlled Vital Signs Assessment: post-procedure vital signs reviewed and stable Respiratory status: spontaneous breathing, nonlabored ventilation, respiratory function stable and patient connected to nasal cannula oxygen Cardiovascular status: blood pressure returned to baseline and stable Postop Assessment: no apparent nausea or vomiting Anesthetic complications: no    Last Vitals:  Vitals:   09/07/19 1330 09/07/19 2011  BP: (!) 194/56 (!) 154/65  Pulse: 79 89  Resp: 17 16  Temp:  36.9 C  SpO2: 93% 91%    Last Pain:  Vitals:   09/08/19 0800  TempSrc:   PainSc: 0-No pain                 Ranier Coach S

## 2019-09-14 ENCOUNTER — Other Ambulatory Visit: Payer: Self-pay

## 2019-09-14 ENCOUNTER — Telehealth: Payer: Self-pay

## 2019-09-14 ENCOUNTER — Telehealth: Payer: Self-pay | Admitting: Radiation Oncology

## 2019-09-14 ENCOUNTER — Inpatient Hospital Stay: Payer: Medicare Other | Attending: Nurse Practitioner | Admitting: Nurse Practitioner

## 2019-09-14 ENCOUNTER — Encounter: Payer: Self-pay | Admitting: Nurse Practitioner

## 2019-09-14 VITALS — BP 135/59 | HR 56 | Temp 97.8°F | Resp 17 | Ht 62.0 in | Wt 127.2 lb

## 2019-09-14 DIAGNOSIS — J449 Chronic obstructive pulmonary disease, unspecified: Secondary | ICD-10-CM | POA: Diagnosis not present

## 2019-09-14 DIAGNOSIS — Z79899 Other long term (current) drug therapy: Secondary | ICD-10-CM | POA: Insufficient documentation

## 2019-09-14 DIAGNOSIS — M129 Arthropathy, unspecified: Secondary | ICD-10-CM | POA: Diagnosis not present

## 2019-09-14 DIAGNOSIS — D61818 Other pancytopenia: Secondary | ICD-10-CM | POA: Diagnosis not present

## 2019-09-14 DIAGNOSIS — Z5111 Encounter for antineoplastic chemotherapy: Secondary | ICD-10-CM | POA: Insufficient documentation

## 2019-09-14 DIAGNOSIS — K5730 Diverticulosis of large intestine without perforation or abscess without bleeding: Secondary | ICD-10-CM | POA: Diagnosis not present

## 2019-09-14 DIAGNOSIS — I2510 Atherosclerotic heart disease of native coronary artery without angina pectoris: Secondary | ICD-10-CM | POA: Insufficient documentation

## 2019-09-14 DIAGNOSIS — M8580 Other specified disorders of bone density and structure, unspecified site: Secondary | ICD-10-CM | POA: Insufficient documentation

## 2019-09-14 DIAGNOSIS — F418 Other specified anxiety disorders: Secondary | ICD-10-CM | POA: Diagnosis not present

## 2019-09-14 DIAGNOSIS — E785 Hyperlipidemia, unspecified: Secondary | ICD-10-CM | POA: Diagnosis not present

## 2019-09-14 DIAGNOSIS — C210 Malignant neoplasm of anus, unspecified: Secondary | ICD-10-CM | POA: Insufficient documentation

## 2019-09-14 DIAGNOSIS — I10 Essential (primary) hypertension: Secondary | ICD-10-CM | POA: Diagnosis not present

## 2019-09-14 DIAGNOSIS — I739 Peripheral vascular disease, unspecified: Secondary | ICD-10-CM | POA: Insufficient documentation

## 2019-09-14 DIAGNOSIS — K625 Hemorrhage of anus and rectum: Secondary | ICD-10-CM | POA: Insufficient documentation

## 2019-09-14 MED ORDER — OXYCODONE-ACETAMINOPHEN 5-325 MG PO TABS
ORAL_TABLET | ORAL | Status: AC
  Filled 2019-09-14: qty 1

## 2019-09-14 MED ORDER — OXYCODONE-ACETAMINOPHEN 5-325 MG PO TABS
1.0000 | ORAL_TABLET | Freq: Once | ORAL | Status: AC
  Administered 2019-09-14: 14:00:00 1 via ORAL

## 2019-09-14 MED ORDER — OXYCODONE-ACETAMINOPHEN 5-325 MG PO TABS: 1.0000 | ORAL_TABLET | Freq: Four times a day (QID) | ORAL | 0 refills | Status: DC | PRN

## 2019-09-14 NOTE — Telephone Encounter (Signed)
New message: ° ° °LVM for patient to return call to schedule appt from referral received. °

## 2019-09-14 NOTE — Progress Notes (Addendum)
New Hematology/Oncology Consult   Requesting MD: Dr. Belinda Fisher  [phone]    Reason for Consult: Anal cancer  HPI: Patricia [name] is a 79 year old woman with CAD, history of CABG x3, hypertension, hyperlipidemia, carotid disease, COPD who presented to the emergency department on 09/06/2019 with rectal bleeding.  CT abdomen/pelvis on 09/06/2019 showed a complex 5.1 cm x 3.2 cm x 4.1 cm posterior left perirectal fluid collection/abscess.  On 09/07/2019 she underwent a flexible sigmoidoscopy by Dr. Lyndel Safe with findings of a 6 cm x 5 cm hard fungating anal mass located predominantly at the left anterior-lateral area and extending to the dentate line with minor anal stenosis.  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     Past Medical History:  Diagnosis Date  . Abnormal LFTs   . Allergic rhinitis   . Anxiety and depression    son commited suicide 2010  . 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 CAD (coronary artery disease)    s/p CABG  . Carotid artery disease ([place])    Moderate - f/u dopplers needed 11/2012  . 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 Zadie Rhine  . COPD (chronic obstructive pulmonary disease) ([place])   . HTN (hypertension)    intolerant to ACEi (cough)  . Hyperlipidemia   . Osteopenia   . PAD (peripheral artery disease) ([place])    Carotid dz as  below, and at [facility] 11/12:  critical stenosis noted just below the sheath site leading into a totally occluded SFA and a patent deep femoral artery  . Panic attacks   . Thyroid nodule   :   Past Surgical History:  Procedure Laterality Date  . BIOPSY  09/07/2019   Procedure: BIOPSY;  Surgeon: Jackquline Denmark, MD;  Location: [facility];  Service: Gastroenterology;;  . CORONARY ANGIOPLASTY WITH STENT PLACEMENT     "1; makes total of 3" (07/05/2012)  . CORONARY ARTERY BYPASS GRAFT N/A 12/15/2013   Procedure: CORONARY ARTERY BYPASS GRAFTING (CABG);  Surgeon:  Gaye Pollack, MD;  Location: [facility];  Service: Open Heart Surgery;  Laterality: N/A;  Times 3 using left internal mammary artery and endoscopically harvested right saphenous vein   . FLEXIBLE SIGMOIDOSCOPY N/A 09/07/2019   Procedure: FLEXIBLE SIGMOIDOSCOPY;  Surgeon: Jackquline Denmark, MD;  Location: [facility];  Service: Gastroenterology;  Laterality: N/A;  . INTRAOPERATIVE TRANSESOPHAGEAL ECHOCARDIOGRAM N/A 12/15/2013   Procedure: INTRAOPERATIVE TRANSESOPHAGEAL ECHOCARDIOGRAM;  Surgeon: Gaye Pollack, MD;  Location: [facility];  Service: Open Heart Surgery;  Laterality: N/A;  . 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 LEFT HEART CATHETERIZATION WITH CORONARY ANGIOGRAM N/A 07/02/2011   Procedure: LEFT HEART CATHETERIZATION WITH CORONARY ANGIOGRAM;  Surgeon: Sherren Mocha, MD;  Location: [facility] CATH LAB;  Service: Cardiovascular;  Laterality: N/A;  . LEFT HEART CATHETERIZATION WITH CORONARY ANGIOGRAM N/A 07/05/2012   Procedure: LEFT HEART CATHETERIZATION WITH CORONARY ANGIOGRAM;  Surgeon: Sherren Mocha, MD;  Location: [facility] CATH LAB;  Service: Cardiovascular;  Laterality: N/A;  . LEFT HEART CATHETERIZATION WITH CORONARY ANGIOGRAM N/A 11/28/2013   Procedure: LEFT HEART CATHETERIZATION WITH CORONARY ANGIOGRAM;  Surgeon: Blane Ohara, MD;  Location: [facility] CATH LAB;  Service: Cardiovascular;  Laterality: N/A;  . PERCUTANEOUS CORONARY INTERVENTION-BALLOON ONLY  07/02/2011   Procedure: PERCUTANEOUS CORONARY INTERVENTION-BALLOON ONLY;  Surgeon: Sherren Mocha, MD;  Location: [facility]  CATH LAB;  Service: Cardiovascular;;  . PERCUTANEOUS CORONARY INTERVENTION-BALLOON ONLY  07/05/2012   Procedure: PERCUTANEOUS CORONARY INTERVENTION-BALLOON ONLY;  Surgeon: Sherren Mocha, MD;  Location: [facility] CATH LAB;  Service: Cardiovascular;;  . VAGINAL HYSTERECTOMY  ~ 1976   no oophorectomy  :   Current Outpatient Medications:  .  amLODipine  (NORVASC) 10 MG tablet, Take 1 tablet (10 mg total) by mouth daily., Disp: 90 tablet, Rfl: 1 .  atorvastatin (LIPITOR) 40 MG tablet, Take 1 tablet (40 mg total) by mouth daily., Disp: 90 tablet, Rfl: 1 .  budesonide-formoterol (SYMBICORT) 160-4.5 MCG/ACT inhaler, Inhale 2 puffs into the lungs 2 (two) times daily. (Patient taking differently: Inhale 2 puffs into the lungs daily as needed (sob and wheezing). ), Disp: 1 Inhaler, Rfl: 3 .  Cholecalciferol (VITAMIN D3) 1000 UNITS CAPS, Take 1,000 Units by mouth daily., Disp: , Rfl:  .  clonazePAM (KLONOPIN) 0.5 MG tablet, TAKE 1 TABLET BY MOUTH IN THE MORNING AS NEEDED FOR ANXIETY AND 1 & 1/2 TO 2 (ONE & ONE-HALF TO TWO) AT BEDTIME AS NEEDED FOR SLEEP (Patient taking differently: Take 0.25 mg by mouth at bedtime as needed for anxiety (sleep). ), Disp: 60 tablet, Rfl: 3 .  FLUoxetine (PROZAC) 20 MG capsule, Take 2 capsules (40 mg total) by mouth daily., Disp: 180 capsule, Rfl: 1 .  losartan (COZAAR) 100 MG tablet, Take 1 tablet by mouth once daily, Disp: 90 tablet, Rfl: 3 .  traMADol (ULTRAM) 50 MG tablet, Take 1 tablet (50 mg total) by mouth every 6 (six) hours as needed for moderate pain or severe pain., Disp: 20 tablet, Rfl: 0  Current Facility-Administered Medications:  .  oxyCODONE-acetaminophen (PERCOCET/ROXICET) 5-325 MG per tablet 1 tablet, 1 tablet, Oral, Once, Owens Shark, NP:  :   Allergies  Allergen Reactions  . Ace Inhibitors     cough  :  FH: No family history of cancer  SOCIAL HISTORY: She lives in [place].  Her grandson and his family live with her.  She is retired from Medical sales representative work.  She quit smoking 5 years ago.  No alcohol use.  Review of Systems: She reports rectal pain and bleeding for the past couple of weeks.  She attributed symptoms to hemorrhoids.  She has noted constipation for at least 1 week.  She adjusted her diet and began MiraLAX and is now having bowel movements on a regular basis.  She notes stools are  thinner than typical.  She is also having pain at the lower back.  No abdominal pain.  Appetite is poor.  She has lost about 10 pounds over the past few weeks.  No fevers or sweats.  No dysphagia.  No cough or shortness of breath.  No chest pain.  No nausea or vomiting.  No hematuria or dysuria.  Physical Exam:  Blood pressure (!) 135/59, pulse (!) 56, temperature 97.8 F (36.6 C), temperature source Temporal, resp. rate 17, height 5\' 2"  (1.575 m), weight 127 lb 3.2 oz (57.7 kg), SpO2 91 %.  HEENT: Sclera anicteric.  Oropharynx is without thrush or ulceration. Lungs: Distant breath sounds. Cardiac: Regular rate and rhythm. Abdomen: Abdomen soft and nontender.  No hepatomegaly.  No apparent ascites.  Rectal : Fungating mass left anal canal/margin with possible extension to the medial gluteus, also involving right anal margin. Vascular: No leg edema. Lymph nodes: No palpable cervical, supraclavicular, axillary or right inguinal lymph nodes.  Approximate 1 cm firm node medial left inguinal region. Neurologic: Alert and oriented.  Skin: Warm and dry.  LABS:  RADIOLOGY:  DG Ankle Complete Left  Result Date: 08/22/2019 CLINICAL DATA:  Lateral foot and ankle pain.  No injury EXAM: LEFT ANKLE COMPLETE - 3+ VIEW COMPARISON:  None. FINDINGS: There is no evidence of fracture, dislocation, or joint effusion. There is no evidence of arthropathy or other focal bone abnormality. Soft tissues are unremarkable. Mild atherosclerotic calcification. IMPRESSION: Negative. Electronically Signed   By: Franchot Gallo M.D.   On: 08/22/2019 14:08   CT ABDOMEN PELVIS W CONTRAST  Result Date: 09/06/2019 CLINICAL DATA:  Rectal mass on physical exam. EXAM: CT ABDOMEN AND PELVIS WITH CONTRAST TECHNIQUE: Multidetector CT imaging of the abdomen and pelvis was performed using the standard protocol following bolus administration of intravenous contrast. CONTRAST:  113mL OMNIPAQUE IOHEXOL 300 MG/ML  SOLN COMPARISON:  None.  FINDINGS: Lower chest: No acute abnormality. Hepatobiliary: No focal liver abnormality is seen. Mild diffuse fatty infiltration of the liver parenchyma is noted. No gallstones, gallbladder wall thickening, or biliary dilatation. Pancreas: Unremarkable. No pancreatic ductal dilatation or surrounding inflammatory changes. Spleen: Normal in size without focal abnormality. Adrenals/Urinary Tract: The right adrenal gland is normal in appearance. A 2.8 cm x 2.1 cm heterogeneous low-attenuation left adrenal mass is noted. The right kidney is normal in size and appearance, while the left kidney is mildly decreased in size and slightly malpositioned within the posteromedial aspect of the mid left abdomen. Mild focal scarring is seen involving the upper pole of the left kidney. Subcentimeter cysts are seen within the right kidney. A mild amount of calcification is seen along the posterolateral aspect of the left kidney, without evidence of renal calculi or hydronephrosis. Bladder is unremarkable. Stomach/Bowel: Stomach is within normal limits. The appendix is not clearly identified. No evidence of bowel dilatation. Noninflamed diverticula are seen throughout the sigmoid colon. A 5.1 cm x 3.2 cm x 4.1 cm complex area of fluid attenuation (approximately 44.34 Hounsfield units) is seen within the posterior perirectal region on the left (axial CT images 79 through 89, CT series number 2). A very thin surrounding hyperdense rim is noted. A very mild amount of associated inflammatory fat stranding is present. Vascular/Lymphatic: Marked severity aortic atherosclerosis with marked severity calcification of the bilateral common iliac arteries. No enlarged abdominal or pelvic lymph nodes. Reproductive: Status post hysterectomy. No adnexal masses. Other: No abdominal wall hernia or abnormality. No abdominopelvic ascites. Musculoskeletal: Multilevel degenerative changes seen throughout the lumbar spine IMPRESSION: 1. Complex 5.1 cm x 3.2  cm x 4.1 cm posterior left perirectal fluid collection/abscess. Follow-up to resolution is recommended, as an associated soft tissue component and underlying neoplastic process cannot be excluded. 2. Sigmoid diverticulosis. 3. Low-attenuation left adrenal mass which may represent an adrenal adenoma. Electronically Signed   By: Virgina Norfolk M.D.   On: 09/06/2019 15:42   DG Foot Complete Left  Result Date: 08/22/2019 CLINICAL DATA:  Lateral foot and ankle pain.  No injury EXAM: LEFT FOOT - COMPLETE 3+ VIEW COMPARISON:  None. FINDINGS: There is no evidence of fracture or dislocation. There is no evidence of arthropathy or other focal bone abnormality. Soft tissues are unremarkable. IMPRESSION: Negative. Electronically Signed   By: Franchot Gallo M.D.   On: 08/22/2019 14:09  CT images reviewed  Assessment and Plan:   1. Anal cancer  09/06/2019 CT abdomen/pelvis-complex 5.1 cm x 3.2 cm x 4.1 cm posterior left perirectal fluid collection/abscess.    09/07/2019 flexible sigmoidoscopy by Dr. Sim Boast cm x 5 cm hard fungating anal  mass located predominantly at the left anterior-lateral area and extending to the dentate line with minor anal stenosis.  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 2. Rectal bleeding and pain secondary to #1  3. CAD/CABG 4. Hypertension 5. Hyperlipidemia 6. Carotid disease 7. COPD 8. Anxiety/depression  Ms. Dunlop has been diagnosed with anal cancer.  Dr. Benay Spice reviewed the diagnosis, prognosis and treatment options with her and her niece at today's visit.  The recommendation is for treatment with chemotherapy to include mitomycin-C/5-FU and radiation.  We reviewed potential toxicities associated with mitomycin-C including bone marrow toxicity, nausea, alopecia, hemolytic uremic syndrome.  We discussed potential toxicities associated with 5-fluorouracil including mouth sores, diarrhea, hand-foot syndrome, skin hyperpigmentation, skin  rash, increased sensitivity to sun.  She agrees to proceed.  She will attend a chemotherapy education class.  We are referring her for a staging PET scan.  We made a referral to Dr. Lisbeth Renshaw, radiation oncology.  She understands a PICC line is necessary for this chemotherapy regimen.  We anticipate she will begin radiation on 09/26/2019 and will plan for her to begin cycle one 5-FU/mitomycin-C that day as well.  We will make arrangements for a PICC line to be placed the morning of 09/26/2019.  She is symptomatic with bleeding and pain.  She is currently taking tramadol with little relief.  We gave her a Percocet tablet in the office with improvement in the pain.  We will send a prescription to her pharmacy.  We made a referral to Dr. Leighton Ruff to establish care/annual surveillance in the future.  She will return for a follow-up visit on 09/26/2019.  She will contact the office in the interim with any problems.  Patient seen with Dr. Benay Spice.  Ned Card, NP 09/14/2019, 2:20 PM  This was a shared visit with Ned Card.  Ms. Mcadam was interviewed and examined.  She appears to have locally anal cancer.  She is symptomatic with pain and bleeding.  We prescribed Percocet to use as needed for pain.  She will be referred for a staging PET scan and radiation oncology evaluation.  I recommend treatment with concurrent 5-FU/mitomycin-C and radiation if the PET scan confirms localized disease.  We reviewed the potential toxicities associated with 5-fluorouracil and mitomycin-C.  We discussed the likelihood of skin breakdown with the combined modality therapy.  Her case will be presented at the GI tumor conference.  A chemotherapy plan was entered.  We anticipate treatment starting on 09/26/2019.  Julieanne Manson, MD

## 2019-09-14 NOTE — Telephone Encounter (Signed)
Per Ned Card NP called and scheduled patient for PET scan on Thursday 09/22/19 @ 4 pm, patient is to arrive by 3:30p, have water only 6 hours prior, no carbs 12 hours prior. Patient given written instructions and is aware of appointment date, time, location, and instructions

## 2019-09-14 NOTE — Progress Notes (Signed)
START ON PATHWAY REGIMEN - Anal Carcinoma     Chemotherapy concurrent with RT:     Mitomycin      Fluorouracil   **Always confirm dose/schedule in your pharmacy ordering system**  Patient Characteristics: Anal and Anal Margin Tumors, Newly Diagnosed - Locoregional Disease Not Amenable to Local Excision (Clinical Staging) Therapeutic Status: Newly Diagnosed - Locoregional Disease Not Amenable to Local Excision (Clinical Staging) AJCC T Category: Staged < 8th Ed. AJCC N Category: Staged < 8th Ed. AJCC 8 Stage Grouping: Staged < 8th Ed. AJCC M Category: Staged < 8th Ed. Intent of Therapy: Curative Intent, Discussed with Patient

## 2019-09-14 NOTE — Patient Instructions (Addendum)
Fluorouracil, 5FU; Diclofenac topical cream What is this medicine? FLUOROURACIL; DICLOFENAC (flure oh YOOR a sil; dye KLOE fen ak) is a combination of a topical chemotherapy agent and non-steroidal anti-inflammatory drug (NSAID). It is used on the skin to treat skin cancer and skin conditions that could become cancer. This medicine may be used for other purposes; ask your health care provider or pharmacist if you have questions. COMMON BRAND NAME(S): FLUORAC What should I tell my health care provider before I take this medicine? They need to know if you have any of these conditions:  bleeding problems  cigarette smoker  DPD enzyme deficiency  heart disease  high blood pressure  if you frequently drink alcohol containing drinks  kidney disease  liver disease  open or infected skin  stomach problems  swelling or open sores at the treatment site  recent or planned coronary artery bypass graft (CABG) surgery  an unusual or allergic reaction to fluorouracil, diclofenac, aspirin, other NSAIDs, other medicines, foods, dyes, or preservatives  pregnant or trying to get pregnant  breast-feeding How should I use this medicine? This medicine is only for use on the skin. Follow the directions on the prescription label. Wash hands before and after use. Wash affected area and gently pat dry. To apply this medicine use a cotton-tipped applicator, or use gloves if applying with fingertips. If applied with unprotected fingertips, it is very important to wash your hands well after you apply this medicine. Avoid applying to the eyes, nose, or mouth. Apply enough medicine to cover the affected area. You can cover the area with a light gauze dressing, but do not use tight or air-tight dressings. Finish the full course prescribed by your doctor or health care professional, even if you think your condition is better. Do not stop taking except on the advice of your doctor or health care  professional. Talk to your pediatrician regarding the use of this medicine in children. Special care may be needed. Overdosage: If you think you have taken too much of this medicine contact a poison control center or emergency room at once. NOTE: This medicine is only for you. Do not share this medicine with others. What if I miss a dose? If you miss a dose, apply it as soon as you can. If it is almost time for your next dose, only use that dose. Do not apply extra doses. Contact your doctor or health care professional if you miss more than one dose. What may interact with this medicine? Interactions are not expected. Do not use any other skin products without telling your doctor or health care professional. This list may not describe all possible interactions. Give your health care provider a list of all the medicines, herbs, non-prescription drugs, or dietary supplements you use. Also tell them if you smoke, drink alcohol, or use illegal drugs. Some items may interact with your medicine. What should I watch for while using this medicine? Visit your doctor or healthcare provider for checks on your progress. You will need to use this medicine for 2 to 6 weeks. This may be longer depending on the condition being treated. You may not see full healing for another 1 to 2 months after you stop using the medicine. This medicine may cause serious skin reactions. They can happen weeks to months after starting the medicine. Contact your healthcare provider right away if you notice fevers or flu-like symptoms with a rash. The rash may be red or purple and then turn into blisters  or peeling of the skin. Or, you might notice a red rash with swelling of the face, lips or lymph nodes in your neck or under your arms. Treated areas of skin can look unsightly during and for several weeks after treatment with this medicine. This medicine can make you more sensitive to the sun. Keep out of the sun. If you cannot avoid  being in the sun, wear protective clothing and use sunscreen. Do not use sun lamps or tanning beds/booths. If a pet comes in contact with the area where this medicine was applied to your skin or if it is ingested, they may have a serious risk of side effects. If accidental contact happens, the skin of the pet should be washed right away with soap and water. Contact your vet right away if your pet becomes exposed. Do not become pregnant while taking this medicine. Women should inform their doctor if they wish to become pregnant or think they might be pregnant. There is a potential for serious side effects to an unborn child. Talk to your healthcare provider or pharmacist for more information. What side effects may I notice from receiving this medicine? Side effects that you should report to your doctor or health care professional as soon as possible:  allergic reactions like skin rash, itching or hives, swelling of the face, lips, or tongue  black or bloody stools, blood in the urine or vomit  blurred vision  chest pain  difficulty breathing or wheezing  rash, fever, and swollen lymph nodes  redness, blistering, peeling or loosening of the skin, including inside the mouth  severe redness and swelling of normal skin  slurred speech or weakness on one side of the body  trouble passing urine or change in the amount of urine  unexplained weight gain or swelling  unusually weak or tired  yellowing of eyes or skin Side effects that usually do not require medical attention (report to your doctor or health care professional if they continue or are bothersome):  increased sensitivity of the skin to sun and ultraviolet light  pain and burning of the affected area  scaling or swelling of the affected area  skin rash, itching of the affected area  tenderness This list may not describe all possible side effects. Call your doctor for medical advice about side effects. You may report side  effects to FDA at 1-800-FDA-1088. Where should I keep my medicine? Keep out of the reach of children and pets. Store at room temperature between 20 and 25 degrees C (68 and 77 degrees F). Throw away any unused medicine after the expiration date. NOTE: This sheet is a summary. It may not cover all possible information. If you have questions about this medicine, talk to your doctor, pharmacist, or health care provider.  2020 [employer]/Gold Standard (2018-10-13 13:31:57) Mitomycin for Pyelocalyceal Solution What is this medicine? MITOMYCIN (mye toe MYE sin) is a chemotherapy drug. This medicine is used to treat cancer of the urinary tract. This medicine may be used for other purposes; ask your health care provider or pharmacist if you have questions. COMMON BRAND NAME(S): JELMYTO What should I tell my health care provider before I take this medicine? They need to know if you have any of these conditions:  a hole or tear in your bladder or urinary tract  low blood counts, like white cells, platelets, or red blood cells  swelling or narrowing of the tube that carries urine from the kidney to the bladder  an unusual  or allergic reaction to mitomycin, other medicines, foods, dyes or preservatives  pregnant or trying to get pregnant  breast-feeding How should I use this medicine? This drug is given as a catheter infusion into the kidney. It is administered in a hospital or clinic by a specially trained health care provider. You will be given directions to follow before the treatment. Follow your health care provider's directions carefully. Talk to your pediatrician regarding the use of this medicine in children. Special care may be needed. Overdosage: If you think you have taken too much of this medicine contact a poison control center or emergency room at once. NOTE: This medicine is only for you. Do not share this medicine with others. What if I miss a dose? It is important not to miss your  dose. Call your doctor or health care professional if you are unable to keep an appointment. What may interact with this medicine? Interactions are not expected. This list may not describe all possible interactions. Give your health care provider a list of all the medicines, herbs, non-prescription drugs, or dietary supplements you use. Also tell them if you smoke, drink alcohol, or use illegal drugs. Some items may interact with your medicine. What should I watch for while using this medicine? Your condition will be monitored carefully while you are receiving this medicine. Do not let urine touch your skin for at least 6 hours after treatment. Males and females should sit on the toilet when urinating. Flush the toilet several times after you use it. Wash your hands, inner thighs, and genital area with soap and water each time after going to the bathroom. If urine gets on your clothing, wash it right away. Wash it separately from other clothing. Do not become pregnant while taking this medicine or for 6 months after stopping it. Women should inform their health care professional if they wish to become pregnant or think they might be pregnant. Men should not father a child while taking this medicine and for 3 months after stopping it. There is potential for serious side effects to an unborn child. Talk to your health care professional for more information. Do not breast-feed an infant while taking this medicine and for 1 week after stopping it. What side effects may I notice from receiving this medicine? Side effects that you should report to your doctor or health care professional as soon as possible:  allergic reactions like skin rash, itching or hives; swelling of the face, lips, or tongue  change in the amount of urine  nausea, vomiting  pain in the lower back or side  red or dark brown urine  signs and symptoms of infection like fever; chills; cough; sore throat; pain or trouble passing  urine Side effects that usually do not require medical attention (report these to your doctor or health care professional if they continue or are bothersome):  signs and symptoms of low red blood cells or anemia such as unusually weak or tired; feeling faint or lightheaded; falls; breathing problems  stomach pain  violet to blue color of the urine This list may not describe all possible side effects. Call your doctor for medical advice about side effects. You may report side effects to FDA at 1-800-FDA-1088. Where should I keep my medicine? This medicine is given in a hospital or clinic and will not be stored at home. NOTE: This sheet is a summary. It may not cover all possible information. If you have questions about this medicine, talk to your  doctor, pharmacist, or health care provider.  2020 [employer]/Gold Standard (2019-03-16 08:59:43)

## 2019-09-15 ENCOUNTER — Other Ambulatory Visit: Payer: Self-pay | Admitting: Radiation Oncology

## 2019-09-15 ENCOUNTER — Telehealth: Payer: Self-pay | Admitting: Oncology

## 2019-09-15 NOTE — Telephone Encounter (Signed)
Scheduled per los. Called and spoke with patient. Went over and confirmed all appts.

## 2019-09-19 NOTE — Progress Notes (Signed)
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  Biopsies of Anal mass 09/07/2019      PET 09/22/2019:  Sigmoidoscopy 09/07/2019: 6 cm x 5 cm hard fungating anal mass located predominantly at the left anterior-lateral area and extending to the dentate line with minor anal stenosis.  CT Abd/Pelvis 09/06/2019: complex 5.1 cm x 3.2 cm x 4.1 cm posterior left perirectal fluid collection/abscess.  /Past/Anticipated interventions by surgeon, if any:   Past/Anticipated interventions by medical oncology, if any:  Dr. Benay Spice NP [facility] 09/14/2019 -The recommendation is for treatment with chemotherapy to include mitomycin-C/5-FU and radiation.  -We are referring her for a staging PET scan. -We made a referral to Dr. Lisbeth Renshaw, radiation oncology. -We anticipate she will begin radiation on 09/26/2019 and will plan for her to begin cycle one 5-FU/mitomycin-C that day as well.   Weight changes, if any: Nov 25th she was 135 pounds  Bowel/Bladder complaints, if any: started miralax, constipation relieved.  Nausea / Vomiting, if any: No  Pain issues, if any:  Yes, 10/10  Any blood per rectum: yes   SAFETY ISSUES:  Prior radiation? No  Pacemaker/ICD? No  Possible current pregnancy? Hysterectomy  Is the patient on methotrexate? No  Current Complaints/Details:

## 2019-09-20 ENCOUNTER — Inpatient Hospital Stay: Payer: Medicare Other

## 2019-09-20 ENCOUNTER — Encounter: Payer: Self-pay | Admitting: Radiation Oncology

## 2019-09-20 ENCOUNTER — Other Ambulatory Visit: Payer: Self-pay

## 2019-09-20 ENCOUNTER — Ambulatory Visit
Admission: RE | Admit: 2019-09-20 | Discharge: 2019-09-20 | Disposition: A | Discharge status: Home / Self Care | Payer: Medicare Other | Source: Ambulatory Visit | Attending: Radiation Oncology | Admitting: Radiation Oncology

## 2019-09-20 ENCOUNTER — Encounter: Payer: Self-pay | Admitting: Oncology

## 2019-09-20 VITALS — BP 114/55 | HR 103 | Temp 98.9°F | Resp 20 | Wt 123.0 lb

## 2019-09-20 DIAGNOSIS — E785 Hyperlipidemia, unspecified: Secondary | ICD-10-CM | POA: Diagnosis not present

## 2019-09-20 DIAGNOSIS — C211 Malignant neoplasm of anal canal: Secondary | ICD-10-CM | POA: Diagnosis not present

## 2019-09-20 DIAGNOSIS — K625 Hemorrhage of anus and rectum: Secondary | ICD-10-CM | POA: Diagnosis not present

## 2019-09-20 DIAGNOSIS — C210 Malignant neoplasm of anus, unspecified: Secondary | ICD-10-CM

## 2019-09-20 DIAGNOSIS — Z5111 Encounter for antineoplastic chemotherapy: Secondary | ICD-10-CM | POA: Diagnosis not present

## 2019-09-20 DIAGNOSIS — E279 Disorder of adrenal gland, unspecified: Secondary | ICD-10-CM | POA: Diagnosis not present

## 2019-09-20 DIAGNOSIS — Z87891 Personal history of nicotine dependence: Secondary | ICD-10-CM | POA: Diagnosis not present

## 2019-09-20 DIAGNOSIS — K5730 Diverticulosis of large intestine without perforation or abscess without bleeding: Secondary | ICD-10-CM | POA: Insufficient documentation

## 2019-09-20 DIAGNOSIS — Z79899 Other long term (current) drug therapy: Secondary | ICD-10-CM | POA: Insufficient documentation

## 2019-09-20 DIAGNOSIS — G893 Neoplasm related pain (acute) (chronic): Secondary | ICD-10-CM | POA: Diagnosis not present

## 2019-09-20 DIAGNOSIS — Z90710 Acquired absence of both cervix and uterus: Secondary | ICD-10-CM | POA: Diagnosis not present

## 2019-09-20 DIAGNOSIS — Z510 Encounter for antineoplastic radiation therapy: Secondary | ICD-10-CM | POA: Insufficient documentation

## 2019-09-20 DIAGNOSIS — K760 Fatty (change of) liver, not elsewhere classified: Secondary | ICD-10-CM | POA: Insufficient documentation

## 2019-09-20 DIAGNOSIS — M8580 Other specified disorders of bone density and structure, unspecified site: Secondary | ICD-10-CM | POA: Insufficient documentation

## 2019-09-20 DIAGNOSIS — I2510 Atherosclerotic heart disease of native coronary artery without angina pectoris: Secondary | ICD-10-CM | POA: Diagnosis not present

## 2019-09-20 DIAGNOSIS — J449 Chronic obstructive pulmonary disease, unspecified: Secondary | ICD-10-CM | POA: Diagnosis not present

## 2019-09-20 DIAGNOSIS — F418 Other specified anxiety disorders: Secondary | ICD-10-CM | POA: Diagnosis not present

## 2019-09-20 DIAGNOSIS — I10 Essential (primary) hypertension: Secondary | ICD-10-CM | POA: Diagnosis not present

## 2019-09-20 DIAGNOSIS — D61818 Other pancytopenia: Secondary | ICD-10-CM | POA: Diagnosis not present

## 2019-09-20 DIAGNOSIS — I739 Peripheral vascular disease, unspecified: Secondary | ICD-10-CM | POA: Diagnosis not present

## 2019-09-20 LAB — CBC WITH DIFFERENTIAL (CANCER CENTER ONLY)
Abs Immature Granulocytes: 0.02 10*3/uL (ref 0.00–0.07)
Basophils Absolute: 0.1 10*3/uL (ref 0.0–0.1)
Basophils Relative: 1 %
Eosinophils Absolute: 0.1 10*3/uL (ref 0.0–0.5)
Eosinophils Relative: 1 %
HCT: 43.2 % (ref 36.0–46.0)
Hemoglobin: 13.6 g/dL (ref 12.0–15.0)
Immature Granulocytes: 0 %
Lymphocytes Relative: 16 %
Lymphs Abs: 1.2 10*3/uL (ref 0.7–4.0)
MCH: 28.8 pg (ref 26.0–34.0)
MCHC: 31.5 g/dL (ref 30.0–36.0)
MCV: 91.5 fL (ref 80.0–100.0)
Monocytes Absolute: 0.6 10*3/uL (ref 0.1–1.0)
Monocytes Relative: 9 %
Neutro Abs: 5.5 10*3/uL (ref 1.7–7.7)
Neutrophils Relative %: 73 %
Platelet Count: 276 10*3/uL (ref 150–400)
RBC: 4.72 MIL/uL (ref 3.87–5.11)
RDW: 13.8 % (ref 11.5–15.5)
WBC Count: 7.5 10*3/uL (ref 4.0–10.5)
nRBC: 0 % (ref 0.0–0.2)

## 2019-09-20 LAB — CMP (CANCER CENTER ONLY)
ALT: 16 U/L (ref 0–44)
AST: 22 U/L (ref 15–41)
Albumin: 3.5 g/dL (ref 3.5–5.0)
Alkaline Phosphatase: 151 U/L — ABNORMAL HIGH (ref 38–126)
Anion gap: 12 (ref 5–15)
BUN: 10 mg/dL (ref 8–23)
CO2: 29 mmol/L (ref 22–32)
Calcium: 9.6 mg/dL (ref 8.9–10.3)
Chloride: 105 mmol/L (ref 98–111)
Creatinine: 0.71 mg/dL (ref 0.44–1.00)
GFR, Est AFR Am: >60 mL/min (ref >60)
GFR, Estimated: >60 mL/min (ref >60)
Glucose, Bld: 128 mg/dL — ABNORMAL HIGH (ref 70–99)
Potassium: 3.7 mmol/L (ref 3.5–5.1)
Sodium: 146 mmol/L — ABNORMAL HIGH (ref 135–145)
Total Bilirubin: 0.4 mg/dL (ref 0.3–1.2)
Total Protein: 6.8 g/dL (ref 6.5–8.1)

## 2019-09-20 MED ORDER — OXYCODONE-ACETAMINOPHEN 5-325 MG PO TABS
1.0000 | ORAL_TABLET | Freq: Once | ORAL | Status: AC
  Administered 2019-09-20: 1 via ORAL
  Filled 2019-09-20: qty 1

## 2019-09-20 NOTE — Progress Notes (Signed)
Radiation Oncology         [phone] ________________________________  Name: Patricia [name]        MRN: [id number]  Date of Service: 09/20/2019 DOB: 24-Apr-1941  CC:Paz, Alda Berthold, MD  Patricia Sheer, MD     REFERRING PHYSICIAN: Riki Sheer, MD   DIAGNOSIS: The encounter diagnosis was Anal cancer [facility]).   HISTORY OF PRESENT ILLNESS: Patricia [name] is a 79 y.o. female seen at the request of Dr. Benay Spice for a newly diagnosed cancer of the anal canal.  The patient had been experiencing symptoms of bleeding from the rectum as well as pain and presented to the ER on 09/06/2019 complaining of rectal pain and difficulty with bowel movements.  She started having symptoms about 3 weeks prior to presentation, and on examination had a firm verrucous mass at the anus from the 7 to 12 o'clock position with a few small external hemorrhoids, the lesion was friable.Marland Kitchen  She was seen by GI, apparently she had had adenomatous polyps in 2017 during a colonoscopy but no evidence of a lesion at that time.  She underwent sigmoidoscopy with biopsy with Dr. Lyndel Safe on 09/07/2019.  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  During her hospital evaluation she did have a CT scan of the abdomen and pelvis on 09/06/2019 revealing a 5.1 x 3.2 x 4.1 cm left perirectal fluid collection/abscess, sigmoid diverticulosis and a low-attenuation left adrenal mass was noted possibly representing an adenoma.  She had aortic atherosclerosis, calcifications in the bilateral common iliac arteries but no abdominal adenopathy.  She met with Dr. Benay Spice and Ned Card, NP, they have ordered a PET scan, and recommended chemoradiation.  She is seen today to discuss treatment recommendations regarding the radiation component.  Of note the patient states that she had a hysterectomy for uterine fibroids but denies a history of abnormal pap smears or HPV disease.    PREVIOUS  RADIATION THERAPY: No   PAST MEDICAL HISTORY:  Past Medical History:  Diagnosis Date  . Abnormal LFTs   . Allergic rhinitis   . Anxiety and depression    son commited suicide 2010  . 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 CAD (coronary artery disease)    s/p CABG  . Carotid artery disease ([place])    Moderate - f/u dopplers needed 11/2012  . 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 Zadie Rhine  . COPD (chronic obstructive pulmonary disease) ([place])   . HTN (hypertension)    intolerant to ACEi (cough)  . Hyperlipidemia   . Osteopenia   . PAD (peripheral artery disease) ([place])    Carotid dz as  below, and at [facility] 11/12:  critical stenosis noted just below the sheath site leading into a totally occluded SFA and a patent deep femoral artery  . Panic attacks   . Thyroid nodule        PAST SURGICAL HISTORY: Past Surgical History:  Procedure Laterality Date  . BIOPSY  09/07/2019   Procedure: BIOPSY;  Surgeon: Jackquline Denmark, MD;  Location: [facility];  Service: Gastroenterology;;  . CORONARY ANGIOPLASTY WITH STENT PLACEMENT     "1; makes total of 3" (07/05/2012)  . CORONARY ARTERY BYPASS GRAFT N/A 12/15/2013   Procedure: CORONARY ARTERY BYPASS GRAFTING (CABG);  Surgeon: Gaye Pollack, MD;  Location: [facility] OR;  Service: Open Heart  Surgery;  Laterality: N/A;  Times 3 using left internal mammary artery and endoscopically harvested right saphenous vein   . FLEXIBLE SIGMOIDOSCOPY N/A 09/07/2019   Procedure: FLEXIBLE SIGMOIDOSCOPY;  Surgeon: Jackquline Denmark, MD;  Location: [facility];  Service: Gastroenterology;  Laterality: N/A;  . INTRAOPERATIVE TRANSESOPHAGEAL ECHOCARDIOGRAM N/A 12/15/2013   Procedure: INTRAOPERATIVE TRANSESOPHAGEAL ECHOCARDIOGRAM;  Surgeon: Gaye Pollack, MD;  Location: [place] OR;  Service: Open Heart Surgery;  Laterality: N/A;  . 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 LEFT HEART CATHETERIZATION WITH CORONARY ANGIOGRAM N/A 07/02/2011   Procedure: LEFT HEART CATHETERIZATION WITH CORONARY ANGIOGRAM;  Surgeon: Sherren Mocha, MD;  Location: [facility] CATH LAB;  Service: Cardiovascular;  Laterality: N/A;  . LEFT HEART CATHETERIZATION WITH CORONARY ANGIOGRAM N/A 07/05/2012   Procedure: LEFT HEART CATHETERIZATION WITH CORONARY ANGIOGRAM;  Surgeon: Sherren Mocha, MD;  Location: [facility] CATH LAB;  Service: Cardiovascular;  Laterality: N/A;  . LEFT HEART CATHETERIZATION WITH CORONARY ANGIOGRAM N/A 11/28/2013   Procedure: LEFT HEART CATHETERIZATION WITH CORONARY ANGIOGRAM;  Surgeon: Blane Ohara, MD;  Location: [facility] CATH LAB;  Service: Cardiovascular;  Laterality: N/A;  . PERCUTANEOUS CORONARY INTERVENTION-BALLOON ONLY  07/02/2011   Procedure: PERCUTANEOUS CORONARY INTERVENTION-BALLOON ONLY;  Surgeon: Sherren Mocha, MD;  Location[facility] CATH LAB;  Service: Cardiovascular;;  . PERCUTANEOUS CORONARY INTERVENTION-BALLOON ONLY  07/05/2012   Procedure: PERCUTANEOUS CORONARY INTERVENTION-BALLOON ONLY;  Surgeon: Sherren Mocha, MD;  Location: [facility] CATH LAB;  Service: Cardiovascular;;  . VAGINAL HYSTERECTOMY  ~ 1976   no oophorectomy     FAMILY HISTORY:  Family History  Problem Relation Age of Onset  . Hypertension Mother   . Stroke Mother   . Coronary artery disease Father        CAD in female relative <50  . Heart attack Father   . Heart attack Brother 26  . Heart Problems Sister 33       perminant pacemaker  . Suicidality Son 62       2010  . Healthy Grandchild   . Colon cancer Neg Hx   . Breast cancer Neg Hx   . Stomach cancer Neg Hx   . Rectal cancer Neg Hx   . Esophageal cancer Neg Hx   . Liver cancer Neg Hx      SOCIAL HISTORY:  reports that she quit smoking about 5 years ago. Her smoking use included cigarettes. She has a 27.00 pack-year smoking history. She has never used smokeless tobacco. She reports current alcohol use. She reports that  she does not use drugs.  The patient is widowed.  She lives in [place]. Her niece accompanies her, but she lives with her Yolanda Bonine and his family.   ALLERGIES: Ace inhibitors   MEDICATIONS:  Current Outpatient Medications  Medication Sig Dispense Refill  . amLODipine (NORVASC) 10 MG tablet Take 1 tablet (10 mg total) by mouth daily. 90 tablet 1  . atorvastatin (LIPITOR) 40 MG tablet Take 1 tablet (40 mg total) by mouth daily. 90 tablet 1  . budesonide-formoterol (SYMBICORT) 160-4.5 MCG/ACT inhaler Inhale 2 puffs into the lungs 2 (two) times daily. (Patient taking differently: Inhale 2 puffs into the lungs daily as needed (sob and wheezing). ) 1 Inhaler 3  . Cholecalciferol (VITAMIN D3) 1000 UNITS CAPS Take 1,000 Units by mouth daily.    . clonazePAM (KLONOPIN) 0.5 MG tablet TAKE 1 TABLET BY MOUTH IN THE MORNING AS NEEDED  FOR ANXIETY AND 1 & 1/2 TO 2 (ONE & ONE-HALF TO TWO) AT BEDTIME AS NEEDED FOR SLEEP (Patient taking differently: Take 0.25 mg by mouth at bedtime as needed for anxiety (sleep). ) 60 tablet 3  . FLUoxetine (PROZAC) 20 MG capsule Take 2 capsules (40 mg total) by mouth daily. 180 capsule 1  . losartan (COZAAR) 100 MG tablet Take 1 tablet by mouth once daily 90 tablet 3  . oxyCODONE-acetaminophen (PERCOCET/ROXICET) 5-325 MG tablet Take 1 tablet by mouth every 6 (six) hours as needed for severe pain. Do not drive while taking. 40 tablet 0  . traMADol (ULTRAM) 50 MG tablet Take 1 tablet (50 mg total) by mouth every 6 (six) hours as needed for moderate pain or severe pain. 20 tablet 0   No current facility-administered medications for this encounter.     REVIEW OF SYSTEMS: On review of systems, the patient reports that she is doing well overall. She continues to have pain in the anorectal area and has been worried about passing stool and has not been eating as much out of fear of pain. She continues to have light spotting on a pantiliner from the rectal area and is taking  Oxycodone. She is also taking cathartics to have a bowel movement. She denies any chest pain, shortness of breath, cough, fevers, chills, night sweats, unintended weight changes. She denies any bladder disturbances, and denies abdominal pain, nausea or vomiting. She denies any new musculoskeletal or joint aches or pains. A complete review of systems is obtained and is otherwise negative.     PHYSICAL EXAM:  Wt Readings from Last 3 Encounters:  09/20/19 123 lb (55.8 kg)  09/14/19 127 lb 3.2 oz (57.7 kg)  09/07/19 127 lb 13.9 oz (58 kg)   Temp Readings from Last 3 Encounters:  09/20/19 98.9 F (37.2 C)  09/14/19 97.8 F (36.6 C) (Temporal)  09/07/19 98.4 F (36.9 C) (Oral)   BP Readings from Last 3 Encounters:  09/20/19 (!) 114/55  09/14/19 (!) 135/59  09/07/19 (!) 154/65   Pulse Readings from Last 3 Encounters:  09/20/19 (!) 103  09/14/19 (!) 56  09/07/19 89     In general this is a well appearing Caucasian female in no acute distress.  She's alert and oriented x4 and appropriate throughout the examination. Cardiopulmonary assessment is negative for acute distress and she exhibits normal effort. Rectal exam is deferred but photos are reviewed from her ED evaluation that show a circumferential fungating mass from the anal verge extending almost 360 degrees of the verge.     ECOG = 1  0 - Asymptomatic (Fully active, able to carry on all predisease activities without restriction)  1 - Symptomatic but completely ambulatory (Restricted in physically strenuous activity but ambulatory and able to carry out work of a light or sedentary nature. For example, light housework, office work)  2 - Symptomatic, <50% in bed during the day (Ambulatory and capable of all self care but unable to carry out any work activities. Up and about more than 50% of waking hours)  3 - Symptomatic, >50% in bed, but not bedbound (Capable of only limited self-care, confined to bed or chair 50% or more of  waking hours)  4 - Bedbound (Completely disabled. Cannot carry on any self-care. Totally confined to bed or chair)  5 - Death   Eustace Pen MM, Creech RH, Tormey DC, et al. [phone]). "Toxicity and response criteria of the [facility]". [place] Oncol. 5 (  6): 649-55    LABORATORY DATA:  Lab Results  Component Value Date   WBC 7.5 09/20/2019   HGB 13.6 09/20/2019   HCT 43.2 09/20/2019   MCV 91.5 09/20/2019   PLT 276 09/20/2019   Lab Results  Component Value Date   NA 146 (H) 09/20/2019   K 3.7 09/20/2019   CL 105 09/20/2019   CO2 29 09/20/2019   Lab Results  Component Value Date   ALT 16 09/20/2019   AST 22 09/20/2019   ALKPHOS 151 (H) 09/20/2019   BILITOT 0.4 09/20/2019      RADIOGRAPHY: DG Ankle Complete Left  Result Date: 08/22/2019 CLINICAL DATA:  Lateral foot and ankle pain.  No injury EXAM: LEFT ANKLE COMPLETE - 3+ VIEW COMPARISON:  None. FINDINGS: There is no evidence of fracture, dislocation, or joint effusion. There is no evidence of arthropathy or other focal bone abnormality. Soft tissues are unremarkable. Mild atherosclerotic calcification. IMPRESSION: Negative. Electronically Signed   By: Franchot Gallo M.D.   On: 08/22/2019 14:08   CT ABDOMEN PELVIS W CONTRAST  Result Date: 09/06/2019 CLINICAL DATA:  Rectal mass on physical exam. EXAM: CT ABDOMEN AND PELVIS WITH CONTRAST TECHNIQUE: Multidetector CT imaging of the abdomen and pelvis was performed using the standard protocol following bolus administration of intravenous contrast. CONTRAST:  162m OMNIPAQUE IOHEXOL 300 MG/ML  SOLN COMPARISON:  None. FINDINGS: Lower chest: No acute abnormality. Hepatobiliary: No focal liver abnormality is seen. Mild diffuse fatty infiltration of the liver parenchyma is noted. No gallstones, gallbladder wall thickening, or biliary dilatation. Pancreas: Unremarkable. No pancreatic ductal dilatation or surrounding inflammatory changes. Spleen: Normal in size without  focal abnormality. Adrenals/Urinary Tract: The right adrenal gland is normal in appearance. A 2.8 cm x 2.1 cm heterogeneous low-attenuation left adrenal mass is noted. The right kidney is normal in size and appearance, while the left kidney is mildly decreased in size and slightly malpositioned within the posteromedial aspect of the mid left abdomen. Mild focal scarring is seen involving the upper pole of the left kidney. Subcentimeter cysts are seen within the right kidney. A mild amount of calcification is seen along the posterolateral aspect of the left kidney, without evidence of renal calculi or hydronephrosis. Bladder is unremarkable. Stomach/Bowel: Stomach is within normal limits. The appendix is not clearly identified. No evidence of bowel dilatation. Noninflamed diverticula are seen throughout the sigmoid colon. A 5.1 cm x 3.2 cm x 4.1 cm complex area of fluid attenuation (approximately 44.34 Hounsfield units) is seen within the posterior perirectal region on the left (axial CT images 79 through 89, CT series number 2). A very thin surrounding hyperdense rim is noted. A very mild amount of associated inflammatory fat stranding is present. Vascular/Lymphatic: Marked severity aortic atherosclerosis with marked severity calcification of the bilateral common iliac arteries. No enlarged abdominal or pelvic lymph nodes. Reproductive: Status post hysterectomy. No adnexal masses. Other: No abdominal wall hernia or abnormality. No abdominopelvic ascites. Musculoskeletal: Multilevel degenerative changes seen throughout the lumbar spine IMPRESSION: 1. Complex 5.1 cm x 3.2 cm x 4.1 cm posterior left perirectal fluid collection/abscess. Follow-up to resolution is recommended, as an associated soft tissue component and underlying neoplastic process cannot be excluded. 2. Sigmoid diverticulosis. 3. Low-attenuation left adrenal mass which may represent an adrenal adenoma. Electronically Signed   By: TVirgina NorfolkM.D.    On: 09/06/2019 15:42   DG Foot Complete Left  Result Date: 08/22/2019 CLINICAL DATA:  Lateral foot and ankle pain.  No injury EXAM: LEFT  FOOT - COMPLETE 3+ VIEW COMPARISON:  None. FINDINGS: There is no evidence of fracture or dislocation. There is no evidence of arthropathy or other focal bone abnormality. Soft tissues are unremarkable. IMPRESSION: Negative. Electronically Signed   By: Franchot Gallo M.D.   On: 08/22/2019 14:09       IMPRESSION/PLAN: 1. Squamous cell carcinoma of the anus. Dr. Lisbeth Renshaw discusses the pathology findings and reviews the nature of anal cancer, and the rationale to use chemoradiation as a treatment.  He discusses that the intent would be for curative approach, but does agree with proceeding with her PET scan to rule out distant disease and also to utilize for planning purposes. We discussed the risks, benefits, short, and long term effects of radiotherapy, and the patient is interested in proceeding. Dr. Lisbeth Renshaw discusses the delivery and logistics of radiotherapy and anticipates a course of 6 weeks of radiotherapy. Written consent is obtained and placed in the chart, a copy was provided to the patient.She will simulate this afternoon. We anticipate starting treatment next Monday 09/26/19. 2. Pain secondary to #1. We recommend she continue her current regimen. She was also given a single dose of Oxycodone/APAP 5/325 mg times one due to having forgotten her medication at home. She was also encouraged to try topical Dermaplast spray.   In a visit lasting 90 minutes, greater than 50% of the time was spent face to face, in preparation of, discussing, and also coordinating the patient's care.   The above documentation reflects my direct findings during this shared patient visit. Please see the separate note by Dr. Lisbeth Renshaw on this date for the remainder of the patient's plan of care.    Carola Rhine, PAC

## 2019-09-20 NOTE — Progress Notes (Signed)
Met w/ pt to introduce myself as her Arboriculturist.  Pt has 2 insurances so copay assistance shouldn't be needed.  I offered the [place], went over what it covers and gave her the income requirement.  She stated she exceeds the requirement so she doesn't qualify for the grant at this time.  I gave her my card for any questions or concerns she may have in the future.

## 2019-09-22 ENCOUNTER — Other Ambulatory Visit: Payer: Self-pay

## 2019-09-22 ENCOUNTER — Encounter: Payer: Self-pay | Admitting: *Deleted

## 2019-09-22 ENCOUNTER — Other Ambulatory Visit: Payer: Self-pay | Admitting: *Deleted

## 2019-09-22 ENCOUNTER — Telehealth: Payer: Self-pay | Admitting: Oncology

## 2019-09-22 ENCOUNTER — Encounter (HOSPITAL_COMMUNITY)
Admission: RE | Admit: 2019-09-22 | Discharge: 2019-09-22 | Disposition: A | Discharge status: Home / Self Care | Payer: Medicare Other | Source: Ambulatory Visit | Attending: Nurse Practitioner | Admitting: Nurse Practitioner

## 2019-09-22 DIAGNOSIS — C210 Malignant neoplasm of anus, unspecified: Secondary | ICD-10-CM | POA: Diagnosis not present

## 2019-09-22 DIAGNOSIS — J449 Chronic obstructive pulmonary disease, unspecified: Secondary | ICD-10-CM | POA: Diagnosis not present

## 2019-09-22 DIAGNOSIS — Z951 Presence of aortocoronary bypass graft: Secondary | ICD-10-CM | POA: Diagnosis not present

## 2019-09-22 DIAGNOSIS — I10 Essential (primary) hypertension: Secondary | ICD-10-CM | POA: Insufficient documentation

## 2019-09-22 DIAGNOSIS — Z87891 Personal history of nicotine dependence: Secondary | ICD-10-CM | POA: Diagnosis not present

## 2019-09-22 DIAGNOSIS — Z79899 Other long term (current) drug therapy: Secondary | ICD-10-CM | POA: Diagnosis not present

## 2019-09-22 DIAGNOSIS — I2510 Atherosclerotic heart disease of native coronary artery without angina pectoris: Secondary | ICD-10-CM | POA: Diagnosis not present

## 2019-09-22 DIAGNOSIS — E785 Hyperlipidemia, unspecified: Secondary | ICD-10-CM | POA: Diagnosis not present

## 2019-09-22 LAB — GLUCOSE, CAPILLARY: Glucose-Capillary: 114 mg/dL — ABNORMAL HIGH (ref 70–99)

## 2019-09-22 MED ORDER — ONDANSETRON HCL 8 MG PO TABS: 8.0000 mg | ORAL_TABLET | Freq: Three times a day (TID) | ORAL | 1 refills | Status: DC | PRN

## 2019-09-22 MED ORDER — PROCHLORPERAZINE MALEATE 10 MG PO TABS: 10.0000 mg | ORAL_TABLET | Freq: Four times a day (QID) | ORAL | 1 refills | Status: DC | PRN

## 2019-09-22 MED ORDER — FLUDEOXYGLUCOSE F - 18 (FDG) INJECTION
6.7000 | Freq: Once | INTRAVENOUS | Status: AC
  Administered 2019-09-22: 16:00:00 6.7 via INTRAVENOUS

## 2019-09-22 NOTE — Telephone Encounter (Signed)
R/s appt per 2/11 sch message - unable to reach pt . Left message with new appt date and time

## 2019-09-23 ENCOUNTER — Telehealth: Payer: Self-pay | Admitting: *Deleted

## 2019-09-23 NOTE — Telephone Encounter (Signed)
Informed patient that the appointments that were moved to 2/22 have been moved back to 09/26/19 per request of radiation oncology. She will go to [facility] radiology at 10 am on Monday for PICC line placement and then come to [place] for labs via her PICC line. Will see NP and then have her treatment. When chemo session is complete, she will be assisted back to radiation oncology for her 1st treatment. Informed her that scripts for Zofran and Compazine were sent to her pharmacy to have in case she has nausea.

## 2019-09-25 ENCOUNTER — Other Ambulatory Visit: Payer: Self-pay | Admitting: Oncology

## 2019-09-25 DIAGNOSIS — C210 Malignant neoplasm of anus, unspecified: Secondary | ICD-10-CM | POA: Diagnosis not present

## 2019-09-25 DIAGNOSIS — Z510 Encounter for antineoplastic radiation therapy: Secondary | ICD-10-CM | POA: Diagnosis not present

## 2019-09-26 ENCOUNTER — Ambulatory Visit (HOSPITAL_COMMUNITY)
Admission: RE | Admit: 2019-09-26 | Discharge: 2019-09-26 | Disposition: A | Discharge status: Home / Self Care | Payer: Medicare Other | Source: Ambulatory Visit | Attending: Nurse Practitioner | Admitting: Nurse Practitioner

## 2019-09-26 ENCOUNTER — Ambulatory Visit: Payer: Medicare Other

## 2019-09-26 ENCOUNTER — Inpatient Hospital Stay: Payer: Medicare Other

## 2019-09-26 ENCOUNTER — Encounter: Payer: Self-pay | Admitting: Nurse Practitioner

## 2019-09-26 ENCOUNTER — Other Ambulatory Visit: Payer: Medicare Other

## 2019-09-26 ENCOUNTER — Ambulatory Visit
Admission: RE | Admit: 2019-09-26 | Discharge: 2019-09-26 | Disposition: A | Discharge status: Home / Self Care | Payer: Medicare Other | Source: Ambulatory Visit | Attending: Radiation Oncology | Admitting: Radiation Oncology

## 2019-09-26 ENCOUNTER — Ambulatory Visit: Payer: Medicare Other | Admitting: Nurse Practitioner

## 2019-09-26 ENCOUNTER — Other Ambulatory Visit: Payer: Self-pay | Admitting: Hematology and Oncology

## 2019-09-26 ENCOUNTER — Other Ambulatory Visit: Payer: Self-pay | Admitting: Nurse Practitioner

## 2019-09-26 ENCOUNTER — Other Ambulatory Visit (HOSPITAL_COMMUNITY): Payer: Medicare Other

## 2019-09-26 ENCOUNTER — Inpatient Hospital Stay (HOSPITAL_BASED_OUTPATIENT_CLINIC_OR_DEPARTMENT_OTHER): Payer: Medicare Other | Admitting: Nurse Practitioner

## 2019-09-26 ENCOUNTER — Other Ambulatory Visit: Payer: Self-pay

## 2019-09-26 VITALS — BP 111/66 | HR 76 | Temp 98.0°F | Resp 18 | Ht 62.0 in | Wt 121.3 lb

## 2019-09-26 DIAGNOSIS — K625 Hemorrhage of anus and rectum: Secondary | ICD-10-CM | POA: Diagnosis not present

## 2019-09-26 DIAGNOSIS — I739 Peripheral vascular disease, unspecified: Secondary | ICD-10-CM | POA: Diagnosis not present

## 2019-09-26 DIAGNOSIS — I2510 Atherosclerotic heart disease of native coronary artery without angina pectoris: Secondary | ICD-10-CM | POA: Diagnosis not present

## 2019-09-26 DIAGNOSIS — Z95828 Presence of other vascular implants and grafts: Secondary | ICD-10-CM | POA: Insufficient documentation

## 2019-09-26 DIAGNOSIS — Z5111 Encounter for antineoplastic chemotherapy: Secondary | ICD-10-CM | POA: Diagnosis not present

## 2019-09-26 DIAGNOSIS — C210 Malignant neoplasm of anus, unspecified: Secondary | ICD-10-CM

## 2019-09-26 DIAGNOSIS — E041 Nontoxic single thyroid nodule: Secondary | ICD-10-CM | POA: Diagnosis not present

## 2019-09-26 DIAGNOSIS — D61818 Other pancytopenia: Secondary | ICD-10-CM | POA: Diagnosis not present

## 2019-09-26 DIAGNOSIS — Z510 Encounter for antineoplastic radiation therapy: Secondary | ICD-10-CM | POA: Diagnosis not present

## 2019-09-26 LAB — CBC WITH DIFFERENTIAL (CANCER CENTER ONLY)
Abs Immature Granulocytes: 0.04 10*3/uL (ref 0.00–0.07)
Basophils Absolute: 0 10*3/uL (ref 0.0–0.1)
Basophils Relative: 0 %
Eosinophils Absolute: 0 10*3/uL (ref 0.0–0.5)
Eosinophils Relative: 0 %
HCT: 43.5 % (ref 36.0–46.0)
Hemoglobin: 13.8 g/dL (ref 12.0–15.0)
Immature Granulocytes: 0 %
Lymphocytes Relative: 10 %
Lymphs Abs: 1 10*3/uL (ref 0.7–4.0)
MCH: 29.1 pg (ref 26.0–34.0)
MCHC: 31.7 g/dL (ref 30.0–36.0)
MCV: 91.6 fL (ref 80.0–100.0)
Monocytes Absolute: 0.7 10*3/uL (ref 0.1–1.0)
Monocytes Relative: 7 %
Neutro Abs: 8.3 10*3/uL — ABNORMAL HIGH (ref 1.7–7.7)
Neutrophils Relative %: 83 %
Platelet Count: 274 10*3/uL (ref 150–400)
RBC: 4.75 MIL/uL (ref 3.87–5.11)
RDW: 13.7 % (ref 11.5–15.5)
WBC Count: 10.1 10*3/uL (ref 4.0–10.5)
nRBC: 0 % (ref 0.0–0.2)

## 2019-09-26 LAB — CMP (CANCER CENTER ONLY)
ALT: 15 U/L (ref 0–44)
AST: 20 U/L (ref 15–41)
Albumin: 3.6 g/dL (ref 3.5–5.0)
Alkaline Phosphatase: 156 U/L — ABNORMAL HIGH (ref 38–126)
Anion gap: 10 (ref 5–15)
BUN: 11 mg/dL (ref 8–23)
CO2: 28 mmol/L (ref 22–32)
Calcium: 10.2 mg/dL (ref 8.9–10.3)
Chloride: 108 mmol/L (ref 98–111)
Creatinine: 0.74 mg/dL (ref 0.44–1.00)
GFR, Est AFR Am: >60 mL/min (ref >60)
GFR, Estimated: >60 mL/min (ref >60)
Glucose, Bld: 145 mg/dL — ABNORMAL HIGH (ref 70–99)
Potassium: 3.8 mmol/L (ref 3.5–5.1)
Sodium: 146 mmol/L — ABNORMAL HIGH (ref 135–145)
Total Bilirubin: 0.3 mg/dL (ref 0.3–1.2)
Total Protein: 7 g/dL (ref 6.5–8.1)

## 2019-09-26 MED ORDER — LIDOCAINE HCL 1 % IJ SOLN
INTRAMUSCULAR | Status: AC
  Filled 2019-09-26: qty 20

## 2019-09-26 MED ORDER — HEPARIN SOD (PORK) LOCK FLUSH 100 UNIT/ML IV SOLN
500.0000 [IU] | Freq: Once | INTRAVENOUS | Status: DC | PRN
  Filled 2019-09-26: qty 5

## 2019-09-26 MED ORDER — HEPARIN SOD (PORK) LOCK FLUSH 100 UNIT/ML IV SOLN
250.0000 [IU] | Freq: Once | INTRAVENOUS | Status: DC | PRN
  Filled 2019-09-26: qty 5

## 2019-09-26 MED ORDER — PROCHLORPERAZINE MALEATE 10 MG PO TABS
10.0000 mg | ORAL_TABLET | Freq: Once | ORAL | Status: AC
  Administered 2019-09-26: 14:00:00 10 mg via ORAL

## 2019-09-26 MED ORDER — SODIUM CHLORIDE 0.9% FLUSH
10.0000 mL | INTRAVENOUS | Status: DC | PRN
  Filled 2019-09-26: qty 10

## 2019-09-26 MED ORDER — SODIUM CHLORIDE 0.9 % IV SOLN
Freq: Once | INTRAVENOUS | Status: AC
  Filled 2019-09-26: qty 250

## 2019-09-26 MED ORDER — PROCHLORPERAZINE MALEATE 10 MG PO TABS
ORAL_TABLET | ORAL | Status: AC
  Filled 2019-09-26: qty 1

## 2019-09-26 MED ORDER — OXYCODONE-ACETAMINOPHEN 5-325 MG PO TABS: 1.0000 | ORAL_TABLET | Freq: Four times a day (QID) | ORAL | 0 refills | Status: DC | PRN

## 2019-09-26 MED ORDER — SODIUM CHLORIDE 0.9 % IV SOLN
790.0000 mg/m2/d | INTRAVENOUS | Status: DC
  Administered 2019-09-26: 5000 mg via INTRAVENOUS
  Filled 2019-09-26: qty 100

## 2019-09-26 MED ORDER — SODIUM CHLORIDE 0.9% FLUSH
10.0000 mL | INTRAVENOUS | Status: DC | PRN
  Administered 2019-09-26: 13:00:00 10 mL
  Filled 2019-09-26: qty 10

## 2019-09-26 MED ORDER — MITOMYCIN CHEMO IV INJECTION 20 MG
7.0000 mg/m2 | Freq: Once | INTRAVENOUS | Status: AC
  Administered 2019-09-26: 11 mg via INTRAVENOUS
  Filled 2019-09-26: qty 22

## 2019-09-26 MED ORDER — LIDOCAINE HCL 1 % IJ SOLN
INTRAMUSCULAR | Status: DC | PRN
  Administered 2019-09-26: 5 mL

## 2019-09-26 NOTE — Procedures (Signed)
Right basilic vein single-lumen PICC placed without immediate complications.  Length 44 cm.  Tip SVC/right atrial junction.  Okay to use.  Medication used-1% lidocaine to skin and subcutaneous tissue.  EBL < 2 cc.

## 2019-09-26 NOTE — Discharge Instructions (Signed)
PICC Home Care Guide  A peripherally inserted central catheter (PICC) is a form of IV access that allows medicines and IV fluids to be quickly distributed throughout the body. The PICC is a long, thin, flexible tube (catheter) that is inserted into a vein in the upper arm. The catheter ends in a large vein in the chest (superior vena cava, or SVC). After the PICC is inserted, a chest X-ray may be done to make sure that it is in the correct place. A PICC may be placed for different reasons, such as:  To give medicines and liquid nutrition.  To give IV fluids and blood products.  If there is trouble placing a peripheral intravenous (PIV) catheter. If taken care of properly, a PICC can remain in place for several months. Having a PICC can also allow a person to go home from the hospital sooner. Medicine and PICC care can be managed at home by a family member, caregiver, or home health care team. What are the risks? Generally, having a PICC is safe. However, problems may occur, including:  A blood clot (thrombus) forming in or at the tip of the PICC.  A blood clot forming in a vein (deep vein thrombosis) or traveling to the lung (pulmonary embolism).  Inflammation of the vein (phlebitis) in which the PICC is placed.  Infection. Central line associated blood stream infection (CLABSI) is a serious infection that often requires hospitalization.  PICC movement (malposition). The PICC tip may move from its original position due to excessive physical activity, forceful coughing, sneezing, or vomiting.  A break or cut in the PICC. It is important not to use scissors near the PICC.  Nerve or tendon irritation or injury during PICC insertion. How to take care of your PICC Preventing problems  You and any caregivers should wash your hands often with soap. Wash hands: ? Before touching the PICC line or the infusion device. ? Before changing a bandage (dressing).  Flush the PICC as told by your  health care provider. Let your health care provider know right away if the PICC is hard to flush or does not flush. Do not use force to flush the PICC.  Do not use a syringe that is less than 10 mL to flush the PICC.  Avoid blood pressure checks on the arm in which the PICC is placed.  Never pull or tug on the PICC.  Do not take the PICC out yourself. Only a trained clinical professional should remove the PICC.  Use clean and sterile supplies only. Keep the supplies in a dry place. Do not reuse needles, syringes, or any other supplies. Doing that can lead to infection.  Keep pets and children away from your PICC line.  Check the PICC insertion site every day for signs of infection. Check for: ? Leakage. ? Redness, swelling, or pain. ? Fluid or blood. ? Warmth. ? Pus or a bad smell. PICC dressing care  Keep your PICC bandage (dressing) clean and dry to prevent infection.  Do not take baths, swim, or use a hot tub until your health care provider approves. Ask your health care provider if you can take showers. You may only be allowed to take sponge baths for bathing. When you are allowed to shower: ? Ask your health care provider to teach you how to wrap the PICC line. ? Cover the PICC line with clear plastic wrap and tape to keep it dry while showering.  Follow instructions from your health care provider   about how to take care of your insertion site and dressing. Make sure you: ? Wash your hands with soap and water before you change your bandage (dressing). If soap and water are not available, use hand sanitizer. ? Change your dressing as told by your health care provider. ? Leave stitches (sutures), skin glue, or adhesive strips in place. These skin closures may need to stay in place for 2 weeks or longer. If adhesive strip edges start to loosen and curl up, you may trim the loose edges. Do not remove adhesive strips completely unless your health care provider tells you to do  that.  Change your PICC dressing if it becomes loose or wet. General instructions   Carry your PICC identification card or wear a medical alert bracelet at all times.  Keep the tube clamped at all times, unless it is being used.  Carry a smooth-edge clamp with you at all times to place on the tube if it breaks.  Do not use scissors or sharp objects near the tube.  You may bend your arm and move it freely. If your PICC is near or at the bend of your elbow, avoid activity with repeated motion at the elbow.  Avoid lifting heavy objects as told by your health care provider.  Keep all follow-up visits as told by your health care provider. This is important. Disposal of supplies  Throw away any syringes in a disposal container that is meant for sharp items (sharps container). You can buy a sharps container from a pharmacy, or you can make one by using an empty hard plastic bottle with a cover.  Place any used dressings or infusion bags into a plastic bag. Throw that bag in the trash. Contact a health care provider if:  You have pain in your arm, ear, face, or teeth.  You have a fever or chills.  You have redness, swelling, or pain around the insertion site.  You have fluid or blood coming from the insertion site.  Your insertion site feels warm to the touch.  You have pus or a bad smell coming from the insertion site.  Your skin feels hard and raised around the insertion site. Get help right away if:  Your PICC is accidentally pulled all the way out. If this happens, cover the insertion site with a bandage or gauze dressing. Do not throw the PICC away. Your health care provider will need to check it.  Your PICC was tugged or pulled and has partially come out. Do not  push the PICC back in.  You cannot flush the PICC, it is hard to flush, or the PICC leaks around the insertion site when it is flushed.  You hear a "flushing" sound when the PICC is flushed.  You feel your  heart racing or skipping beats.  There is a hole or tear in the PICC.  You have swelling in the arm in which the PICC was inserted.  You have a red streak going up your arm from where the PICC was inserted. Summary  A peripherally inserted central catheter (PICC) is a long, thin, flexible tube (catheter) that is inserted into a vein in the upper arm.  The PICC is inserted using a sterile technique by a specially trained nurse or physician. Only a trained clinical professional should remove it.  Keep your PICC identification card with you at all times.  Avoid blood pressure checks on the arm in which the PICC is placed.  If cared for   properly, a PICC can remain in place for several months. Having a PICC can also allow a person to go home from the hospital sooner. This information is not intended to replace advice given to you by your health care provider. Make sure you discuss any questions you have with your health care provider. Document Revised: 07/10/2017 Document Reviewed: 08/30/2016 Elsevier Patient Education  2020 [employer]  

## 2019-09-26 NOTE — Progress Notes (Addendum)
[place]   [phone] [phone]) [phone]   Clinic Follow up Note   Patient Care Team: Colon Branch, MD as PCP - General (Internal Medicine) Sherren Mocha, MD as PCP - Cardiology (Cardiology) Rigoberto Noel, MD as Consulting Physician (Pulmonary Disease) Zadie Rhine Clent Demark, MD as Consulting Physician (Ophthalmology) Burtis Junes, NP as Nurse Practitioner (Cardiology) Emaley Shipper, MD as Consulting Physician (Gastroenterology) Jonnie Finner, RN as Oncology Nurse Navigator 09/26/2019  CHIEF COMPLAINT: F/u anal cancer   CURRENT THERAPY: Concurrent chemoRT with 5FU/mitomycin starting 09/26/19   INTERVAL HISTORY: Patricia [name] returns for follow up and to begin treatment. She continues to have moderate to severe rectal pain, 9/10 today. She takes percocet q6 hours which is helping. She had a BM today with bleeding after wiping. Wears a pad because bleeding may last a while. She denies fever, chills, cough, chest pain, dyspnea, neuropathy. She lives with son, daughter in law and grand child, and a cat.     MEDICAL HISTORY:  Past Medical History:  Diagnosis Date  . Abnormal LFTs   . Allergic rhinitis   . Anxiety and depression    son commited suicide 2010  . 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 CAD (coronary artery disease)    s/p CABG  . Carotid artery disease ([place])    Moderate - f/u dopplers needed 11/2012  . 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 Zadie Rhine  . COPD (chronic obstructive pulmonary disease) ([place])   . HTN (hypertension)    intolerant to ACEi (cough)  . Hyperlipidemia   . Osteopenia   . PAD (peripheral artery disease) ([place])    Carotid dz as  below, and at [facility] 11/12:  critical stenosis noted just below the sheath site leading into a totally occluded SFA and a patent deep femoral artery  . Panic attacks   . Thyroid nodule     SURGICAL HISTORY: Past Surgical History:  Procedure  Laterality Date  . BIOPSY  09/07/2019   Procedure: BIOPSY;  Surgeon: Jackquline Denmark, MD;  Location: [facility];  Service: Gastroenterology;;  . CORONARY ANGIOPLASTY WITH STENT PLACEMENT     "1; makes total of 3" (07/05/2012)  . CORONARY ARTERY BYPASS GRAFT N/A 12/15/2013   Procedure: CORONARY ARTERY BYPASS GRAFTING (CABG);  Surgeon: Gaye Pollack, MD;  Location: [place];  Service: Open Heart Surgery;  Laterality: N/A;  Times 3 using left internal mammary artery and endoscopically harvested right saphenous vein   . FLEXIBLE SIGMOIDOSCOPY N/A 09/07/2019   Procedure: FLEXIBLE SIGMOIDOSCOPY;  Surgeon: Jackquline Denmark, MD;  Location: [facility];  Service: Gastroenterology;  Laterality: N/A;  . INTRAOPERATIVE TRANSESOPHAGEAL ECHOCARDIOGRAM N/A 12/15/2013   Procedure: INTRAOPERATIVE TRANSESOPHAGEAL ECHOCARDIOGRAM;  Surgeon: Gaye Pollack, MD;  Location: [facility];  Service: Open Heart Surgery;  Laterality: N/A;  . 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 LEFT HEART CATHETERIZATION WITH CORONARY ANGIOGRAM N/A 07/02/2011   Procedure: LEFT HEART CATHETERIZATION WITH CORONARY ANGIOGRAM;  Surgeon: Sherren Mocha, MD;  Location: [facility] CATH LAB;  Service: Cardiovascular;  Laterality: N/A;  . LEFT HEART CATHETERIZATION WITH CORONARY ANGIOGRAM N/A 07/05/2012   Procedure: LEFT HEART CATHETERIZATION WITH CORONARY ANGIOGRAM;  Surgeon: Sherren Mocha, MD;  Location: [facility] CATH LAB;  Service: Cardiovascular;  Laterality: N/A;  . LEFT HEART CATHETERIZATION WITH  CORONARY ANGIOGRAM N/A 11/28/2013   Procedure: LEFT HEART CATHETERIZATION WITH CORONARY ANGIOGRAM;  Surgeon: Blane Ohara, MD;  Location: [facility] CATH LAB;  Service: Cardiovascular;  Laterality: N/A;  . PERCUTANEOUS CORONARY INTERVENTION-BALLOON ONLY  07/02/2011   Procedure: PERCUTANEOUS CORONARY INTERVENTION-BALLOON ONLY;  Surgeon: Sherren Mocha, MD;  Location: [facility] CATH LAB;   Service: Cardiovascular;;  . PERCUTANEOUS CORONARY INTERVENTION-BALLOON ONLY  07/05/2012   Procedure: PERCUTANEOUS CORONARY INTERVENTION-BALLOON ONLY;  Surgeon: Sherren Mocha, MD;  Location: [facility] CATH LAB;  Service: Cardiovascular;;  . VAGINAL HYSTERECTOMY  ~ 1976   no oophorectomy    I have reviewed the social history and family history with the patient and they are unchanged from previous note.  ALLERGIES:  is allergic to ace inhibitors.  MEDICATIONS:  Current Outpatient Medications  Medication Sig Dispense Refill  . amLODipine (NORVASC) 10 MG tablet Take 1 tablet (10 mg total) by mouth daily. 90 tablet 1  . atorvastatin (LIPITOR) 40 MG tablet Take 1 tablet (40 mg total) by mouth daily. 90 tablet 1  . budesonide-formoterol (SYMBICORT) 160-4.5 MCG/ACT inhaler Inhale 2 puffs into the lungs 2 (two) times daily. (Patient taking differently: Inhale 2 puffs into the lungs daily as needed (sob and wheezing). ) 1 Inhaler 3  . Cholecalciferol (VITAMIN D3) 1000 UNITS CAPS Take 1,000 Units by mouth daily.    . clonazePAM (KLONOPIN) 0.5 MG tablet TAKE 1 TABLET BY MOUTH IN THE MORNING AS NEEDED FOR ANXIETY AND 1 & 1/2 TO 2 (ONE & ONE-HALF TO TWO) AT BEDTIME AS NEEDED FOR SLEEP (Patient taking differently: Take 0.25 mg by mouth at bedtime as needed for anxiety (sleep). ) 60 tablet 3  . FLUoxetine (PROZAC) 20 MG capsule Take 2 capsules (40 mg total) by mouth daily. 180 capsule 1  . losartan (COZAAR) 100 MG tablet Take 1 tablet by mouth once daily 90 tablet 3  . ondansetron (ZOFRAN) 8 MG tablet Take 1 tablet (8 mg total) by mouth every 8 (eight) hours as needed for nausea or vomiting. 30 tablet 1  . oxyCODONE-acetaminophen (PERCOCET/ROXICET) 5-325 MG tablet Take 1 tablet by mouth every 6 (six) hours as needed for severe pain. Do not drive while taking. 80 tablet 0  . prochlorperazine (COMPAZINE) 10 MG tablet Take 1 tablet (10 mg total) by mouth every 6 (six) hours as needed. 60 tablet 1  . traMADol (ULTRAM)  50 MG tablet Take 1 tablet (50 mg total) by mouth every 6 (six) hours as needed for moderate pain or severe pain. 20 tablet 0   No current facility-administered medications for this visit.   Facility-Administered Medications Ordered in Other Visits  Medication Dose Route Frequency Provider Last Rate Last Admin  . lidocaine (XYLOCAINE) 1 % (with pres) injection           . lidocaine (XYLOCAINE) 1 % (with pres) injection    PRN Allred, Darrell K, PA-C   5 mL at 09/26/19 1129    PHYSICAL EXAMINATION:  Vitals:   09/26/19 1309  BP: 111/66  Pulse: 76  Resp: 18  Temp: 98 F (36.7 C)  SpO2: 93%   Filed Weights   09/26/19 1309  Weight: 121 lb 5 oz (55 kg)    GENERAL:alert, no distress and comfortable SKIN: no rash  EYES: sclera clear NECK: no palpable thyroid nodules  LYMPH:  no palpable cervical or supraclavicular lymphadenopathy  LUNGS: distant breath sounds with normal breathing effort  HEART: regular rate & rhythm, no lower extremity edema ABDOMEN:abdomen soft, non-tender and normal bowel  sounds RECTAL: large fungating left anal mass Musculoskeletal:no cyanosis of digits and no clubbing  NEURO: alert & oriented x 3 with fluent speech PICC site C/D/I  LABORATORY DATA:  I have reviewed the data as listed CBC Latest Ref Rng & Units 09/26/2019 09/20/2019 09/08/2019  WBC 4.0 - 10.5 K/uL 10.1 7.5 5.8  Hemoglobin 12.0 - 15.0 g/dL 13.8 13.6 12.5  Hematocrit 36.0 - 46.0 % 43.5 43.2 39.3  Platelets 150 - 400 K/uL 274 276 212     CMP Latest Ref Rng & Units 09/26/2019 09/20/2019 09/08/2019  Glucose 70 - 99 mg/dL 145(H) 128(H) 113(H)  BUN 8 - 23 mg/dL 11 10 5(L)  Creatinine 0.44 - 1.00 mg/dL 0.74 0.71 0.40(L)  Sodium 135 - 145 mmol/L 146(H) 146(H) 141  Potassium 3.5 - 5.1 mmol/L 3.8 3.7 3.2(L)  Chloride 98 - 111 mmol/L 108 105 107  CO2 22 - 32 mmol/L 28 29 28   Calcium 8.9 - 10.3 mg/dL 10.2 9.6 8.7(L)  Total Protein 6.5 - 8.1 g/dL 7.0 6.8 -  Total Bilirubin 0.3 - 1.2 mg/dL 0.3 0.4 -    Alkaline Phos 38 - 126 U/L 156(H) 151(H) -  AST 15 - 41 U/L 20 22 -  ALT 0 - 44 U/L 15 16 -      RADIOGRAPHIC STUDIES: I have personally reviewed the radiological images as listed and agreed with the findings in the report. IR PICC PLACEMENT RIGHT >5 YRS INC IMG GUIDE  Result Date: 09/26/2019 INDICATION: Patient with history of anal cancer; central venous access requested for chemotherapy and lab draws. EXAM: ULTRASOUND AND FLUOROSCOPIC GUIDED PICC LINE INSERTION MEDICATIONS: 1% lidocaine to skin and subcutaneous tissue ANESTHESIA/SEDATION: None FLUOROSCOPY TIME:  Fluoroscopy Time:  36 seconds (2 mGy). COMPLICATIONS: None immediate. TECHNIQUE: The procedure, risks, benefits, and alternatives were explained to the patient and informed written consent was obtained. A timeout was performed prior to the initiation of the procedure. The right upper extremity was prepped with chlorhexidine in a sterile fashion, and a sterile drape was applied covering the operative field. Maximum barrier sterile technique with sterile gowns and gloves were used for the procedure. A timeout was performed prior to the initiation of the procedure. Local anesthesia was provided with 1% lidocaine. Under direct ultrasound guidance, the right basilic vein was accessed with a micropuncture kit after the overlying soft tissues were anesthetized with 1% lidocaine. An ultrasound image was saved for documentation purposes. A guidewire was advanced to the level of the superior caval-atrial junction for measurement purposes and the PICC line was cut to length. A peel-away sheath was placed and a 44 cm, 5 [place], single lumen was inserted to level of the superior caval-atrial junction. A post procedure spot fluoroscopic was obtained. The catheter easily aspirated and flushed and was sutured in place. A dressing was placed. The patient tolerated the procedure well without immediate post procedural complication. FINDINGS: After catheter  placement, the tip lies within the superior cavoatrial junction the catheter aspirates and flushes normally and is ready for immediate use. IMPRESSION: Successful ultrasound and fluoroscopic guided placement of a right basilic vein approach, 44 cm, 5 [place], single lumen PICC with tip at the superior caval-atrial junction. The PICC line is ready for immediate use. Read by: Rowe Robert, PA-C Electronically Signed   By: Sandi Mariscal M.D.   On: 09/26/2019 12:22     ASSESSMENT & PLAN:   1. Anal cancer  09/06/2019 CT abdomen/pelvis-complex 5.1 cm x 3.2 cm x 4.1 cm posterior left  perirectal fluid collection/abscess.    09/07/2019 flexible sigmoidoscopy by Dr. Sim Boast cm x 5 cm hard fungating anal mass located predominantly at the left anterior-lateral area and extending to the dentate line with minor anal stenosis.  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  Cycle 1 day 1 dose-reduced 5FU/mitomycin and concurrent RT 2. Rectal bleeding and pain secondary to #1  3. CAD/CABG 4. Hypertension 5. Hyperlipidemia 6. Carotid disease 7. COPD 8. Anxiety/depression   Disposition:  Patricia [name] appears stable. She continues to have severe anal pain, currently managed with percocet q6 hours which I refilled for her today. She knows to notify us or RT if this becomes ineffective on treatment, pain meds can be titrated as needed.    We reviewed her PET scan from 09/23/19 which shows the large intensely hypermetabolic anal mass and locoregional nodal metastasis to left inguinal LN, no other definite evidence of metastasis. Additionally there is an intensely hypermetabolic right supraclavicular vs thyroid mass, which is not palpable on my exam. She had a previous thyroid US from 2012 that showed bilateral nodules. Patricia [name] was seen with Dr. Benay Spice today, who explained this would be an atypical pattern of metastasis for anal cancer. She is being referred for thyroid US and possible  biopsy.   Patricia [name] will start concurrent chemoRT today as planned, we again reviewed expected side effects of treatment and symptom management. Dr. Benay Spice is recommending dose-reductions in 5FU and mitomycin due to her advanced age.   She will return for lab only on 2/23 or 2/24, then lab and f/u in 2 weeks.   No problem-specific Assessment & Plan notes found for this encounter.   Orders Placed This Encounter  Procedures  . US Soft Tissue Head/Neck    Standing Status:   Future    Standing Expiration Date:   09/25/2020    Order Specific Question:   Reason for Exam (SYMPTOM  OR DIAGNOSIS REQUIRED)    Answer:   hypermetabolic thyroid vs right supraclav LN on recent PET    Order Specific Question:   Preferred imaging location?    Answer:   [id number]   All questions were answered. The patient knows to call the clinic with any problems, questions or concerns. No barriers to learning was detected.     Alla Feeling, NP 09/26/19  This was a shared visit with Cira Rue.  Patricia [name] was interviewed and examined.  We reviewed the PET images with her.  The hypermetabolic lesion in the right thyroid is most like not related to anal cancer.  We referred her for an ultrasound biopsy.  She will begin concurrent 5-FU/mitomycin-C and radiation today.  Julieanne Manson, MD

## 2019-09-26 NOTE — Patient Instructions (Signed)
[facility] Discharge Instructions for Patients Receiving Chemotherapy  Today you received the following chemotherapy agents: mitomycin and fluorouracil.   To help prevent nausea and vomiting after your treatment, we encourage you to take your nausea medication as directed.  If you develop nausea and vomiting that is not controlled by your nausea medication, call the clinic.   BELOW ARE SYMPTOMS THAT SHOULD BE REPORTED IMMEDIATELY:  *FEVER GREATER THAN 100.5 F  *CHILLS WITH OR WITHOUT FEVER  NAUSEA AND VOMITING THAT IS NOT CONTROLLED WITH YOUR NAUSEA MEDICATION  *UNUSUAL SHORTNESS OF BREATH  *UNUSUAL BRUISING OR BLEEDING  TENDERNESS IN MOUTH AND THROAT WITH OR WITHOUT PRESENCE OF ULCERS  *URINARY PROBLEMS  *BOWEL PROBLEMS  UNUSUAL RASH Items with * indicate a potential emergency and should be followed up as soon as possible.  Feel free to call the clinic should you have any questions or concerns. The clinic phone number is [phone].  Please show the [facility] at check-in to the Emergency Department and triage nurse.  Fluorouracil, 5-FU injection What is this medicine? FLUOROURACIL, 5-FU (flure oh YOOR a sil) is a chemotherapy drug. It slows the growth of cancer cells. This medicine is used to treat many types of cancer like breast cancer, colon or rectal cancer, pancreatic cancer, and stomach cancer. This medicine may be used for other purposes; ask your health care provider or pharmacist if you have questions. COMMON BRAND NAME(S): Adrucil What should I tell my health care provider before I take this medicine? They need to know if you have any of these conditions:  blood disorders  dihydropyrimidine dehydrogenase (DPD) deficiency  infection (especially a virus infection such as chickenpox, cold sores, or herpes)  kidney disease  liver disease  malnourished, poor nutrition  recent or ongoing radiation therapy  an unusual or allergic  reaction to fluorouracil, other chemotherapy, other medicines, foods, dyes, or preservatives  pregnant or trying to get pregnant  breast-feeding How should I use this medicine? This drug is given as an infusion or injection into a vein. It is administered in a hospital or clinic by a specially trained health care professional. Talk to your pediatrician regarding the use of this medicine in children. Special care may be needed. Overdosage: If you think you have taken too much of this medicine contact a poison control center or emergency room at once. NOTE: This medicine is only for you. Do not share this medicine with others. What if I miss a dose? It is important not to miss your dose. Call your doctor or health care professional if you are unable to keep an appointment. What may interact with this medicine?  allopurinol  cimetidine  dapsone  digoxin  hydroxyurea  leucovorin  levamisole  medicines for seizures like ethotoin, fosphenytoin, phenytoin  medicines to increase blood counts like filgrastim, pegfilgrastim, sargramostim  medicines that treat or prevent blood clots like warfarin, enoxaparin, and dalteparin  methotrexate  metronidazole  pyrimethamine  some other chemotherapy drugs like busulfan, cisplatin, estramustine, vinblastine  trimethoprim  trimetrexate  vaccines Talk to your doctor or health care professional before taking any of these medicines:  acetaminophen  aspirin  ibuprofen  ketoprofen  naproxen This list may not describe all possible interactions. Give your health care provider a list of all the medicines, herbs, non-prescription drugs, or dietary supplements you use. Also tell them if you smoke, drink alcohol, or use illegal drugs. Some items may interact with your medicine. What should I watch for while  using this medicine? Visit your doctor for checks on your progress. This drug may make you feel generally unwell. This is not  uncommon, as chemotherapy can affect healthy cells as well as cancer cells. Report any side effects. Continue your course of treatment even though you feel ill unless your doctor tells you to stop. In some cases, you may be given additional medicines to help with side effects. Follow all directions for their use. Call your doctor or health care professional for advice if you get a fever, chills or sore throat, or other symptoms of a cold or flu. Do not treat yourself. This drug decreases your body's ability to fight infections. Try to avoid being around people who are sick. This medicine may increase your risk to bruise or bleed. Call your doctor or health care professional if you notice any unusual bleeding. Be careful brushing and flossing your teeth or using a toothpick because you may get an infection or bleed more easily. If you have any dental work done, tell your dentist you are receiving this medicine. Avoid taking products that contain aspirin, acetaminophen, ibuprofen, naproxen, or ketoprofen unless instructed by your doctor. These medicines may hide a fever. Do not become pregnant while taking this medicine. Women should inform their doctor if they wish to become pregnant or think they might be pregnant. There is a potential for serious side effects to an unborn child. Talk to your health care professional or pharmacist for more information. Do not breast-feed an infant while taking this medicine. Men should inform their doctor if they wish to father a child. This medicine may lower sperm counts. Do not treat diarrhea with over the counter products. Contact your doctor if you have diarrhea that lasts more than 2 days or if it is severe and watery. This medicine can make you more sensitive to the sun. Keep out of the sun. If you cannot avoid being in the sun, wear protective clothing and use sunscreen. Do not use sun lamps or tanning beds/booths. What side effects may I notice from receiving this  medicine? Side effects that you should report to your doctor or health care professional as soon as possible:  allergic reactions like skin rash, itching or hives, swelling of the face, lips, or tongue  low blood counts - this medicine may decrease the number of white blood cells, red blood cells and platelets. You may be at increased risk for infections and bleeding.  signs of infection - fever or chills, cough, sore throat, pain or difficulty passing urine  signs of decreased platelets or bleeding - bruising, pinpoint red spots on the skin, black, tarry stools, blood in the urine  signs of decreased red blood cells - unusually weak or tired, fainting spells, lightheadedness  breathing problems  changes in vision  chest pain  mouth sores  nausea and vomiting  pain, swelling, redness at site where injected  pain, tingling, numbness in the hands or feet  redness, swelling, or sores on hands or feet  stomach pain  unusual bleeding Side effects that usually do not require medical attention (report to your doctor or health care professional if they continue or are bothersome):  changes in finger or toe nails  diarrhea  dry or itchy skin  hair loss  headache  loss of appetite  sensitivity of eyes to the light  stomach upset  unusually teary eyes This list may not describe all possible side effects. Call your doctor for medical advice about side effects.  You may report side effects to FDA at 1-800-FDA-1088. Where should I keep my medicine? This drug is given in a hospital or clinic and will not be stored at home. NOTE: This sheet is a summary. It may not cover all possible information. If you have questions about this medicine, talk to your doctor, pharmacist, or health care provider.  2020 Elsevier/Gold Standard (2007-12-01 13:53:16)  Mitomycin injection What is this medicine? MITOMYCIN (mye toe MYE sin) is a chemotherapy drug. This medicine is used to treat  cancer of the stomach and pancreas. This medicine may be used for other purposes; ask your health care provider or pharmacist if you have questions. COMMON BRAND NAME(S): Mutamycin What should I tell my health care provider before I take this medicine? They need to know if you have any of these conditions:  bleeding disorders  infection (especially a viral infection such as chickenpox, cold sores, or herpes)  low blood counts, like white cells, platelets, or red blood cells  kidney disease  an unusual or allergic reaction to mitomycin, other medicines, foods, dyes, or preservatives  pregnant or trying to get pregnant  breast-feeding How should I use this medicine? This drug is given as an injection or infusion into a vein. It is administered in a hospital or clinic by a specially trained health care professional. Talk to your pediatrician regarding the use of this medicine in children. Special care may be needed. Overdosage: If you think you have taken too much of this medicine contact a poison control center or emergency room at once. NOTE: This medicine is only for you. Do not share this medicine with others. What if I miss a dose? It is important not to miss your dose. Call your doctor or health care professional if you are unable to keep an appointment. What may interact with this medicine? Interactions are not expected. This list may not describe all possible interactions. Give your health care provider a list of all the medicines, herbs, non-prescription drugs, or dietary supplements you use. Also tell them if you smoke, drink alcohol, or use illegal drugs. Some items may interact with your medicine. What should I watch for while using this medicine? Your condition will be monitored carefully while you are receiving this medicine. You will need important blood work done while you are taking this medicine. This drug may make you feel generally unwell. This is not uncommon, as  chemotherapy can affect healthy cells as well as cancer cells. Report any side effects. Continue your course of treatment even though you feel ill unless your doctor tells you to stop. Call your doctor or health care professional for advice if you get a fever, chills or sore throat, or other symptoms of a cold or flu. Do not treat yourself. This drug decreases your body's ability to fight infections. Try to avoid being around people who are sick. This medicine may increase your risk to bruise or bleed. Call your doctor or health care professional if you notice any unusual bleeding. Be careful brushing and flossing your teeth or using a toothpick because you may get an infection or bleed more easily. If you have any dental work done, tell your dentist you are receiving this medicine. Avoid taking products that contain aspirin, acetaminophen, ibuprofen, naproxen, or ketoprofen unless instructed by your doctor. These medicines may hide a fever. Do not become pregnant while taking this medicine. Women should inform their doctor if they wish to become pregnant or think they might be pregnant.  There is a potential for serious side effects to an unborn child. Talk to your health care professional or pharmacist for more information. Do not breast-feed an infant while taking this medicine. What side effects may I notice from receiving this medicine? Side effects that you should report to your doctor or health care professional as soon as possible:  allergic reactions like skin rash, itching or hives, swelling of the face, lips, or tongue  breathing problems  pain, redness, or irritation at site where injected  signs and symptoms of bleeding such as bloody or black, tarry stools; red or dark brown urine; spitting up blood or brown material that looks like coffee grounds; red spots on the skin; unusual bruising or bleeding from the eyes, gums, or nose  signs and symptoms of infection like fever; chills; cough;  sore throat; pain or trouble passing urine  signs and symptoms of kidney injury like trouble passing urine or change in the amount of urine  signs and symptoms of low red blood cells or anemia such as unusually weak or tired; feeling faint or lightheaded; falls; breathing problems Side effects that usually do not require medical attention (report to your doctor or health care professional if they continue or are bothersome):  green to blue color of urine  hair loss  loss of appetite  mouth sores  nausea, vomiting This list may not describe all possible side effects. Call your doctor for medical advice about side effects. You may report side effects to FDA at 1-800-FDA-1088. Where should I keep my medicine? This drug is given in a hospital or clinic and will not be stored at home. NOTE: This sheet is a summary. It may not cover all possible information. If you have questions about this medicine, talk to your doctor, pharmacist, or health care provider.  2020 Elsevier/Gold Standard (2019-03-15 16:33:50)

## 2019-09-27 ENCOUNTER — Ambulatory Visit: Payer: Medicare Other | Admitting: Nurse Practitioner

## 2019-09-27 ENCOUNTER — Telehealth: Payer: Self-pay | Admitting: Nurse Practitioner

## 2019-09-27 ENCOUNTER — Telehealth: Payer: Self-pay | Admitting: *Deleted

## 2019-09-27 ENCOUNTER — Ambulatory Visit
Admission: RE | Admit: 2019-09-27 | Discharge: 2019-09-27 | Disposition: A | Discharge status: Home / Self Care | Payer: Medicare Other | Source: Ambulatory Visit | Attending: Radiation Oncology | Admitting: Radiation Oncology

## 2019-09-27 ENCOUNTER — Other Ambulatory Visit: Payer: Self-pay

## 2019-09-27 ENCOUNTER — Other Ambulatory Visit: Payer: Medicare Other

## 2019-09-27 ENCOUNTER — Ambulatory Visit: Payer: Medicare Other

## 2019-09-27 DIAGNOSIS — C210 Malignant neoplasm of anus, unspecified: Secondary | ICD-10-CM | POA: Diagnosis not present

## 2019-09-27 DIAGNOSIS — Z510 Encounter for antineoplastic radiation therapy: Secondary | ICD-10-CM | POA: Diagnosis not present

## 2019-09-27 NOTE — Telephone Encounter (Signed)
Called pt & left message to call back regarding how she is doing with her Chemo.

## 2019-09-27 NOTE — Telephone Encounter (Signed)
-----   Message from Bo Mcclintock, RN sent at 09/26/2019  3:08 PM EST ----- Regarding: Dr Benay Spice - 1st time chemo follow-up First chemo follow-up. Received mitomycin and 5FU.

## 2019-09-27 NOTE — Telephone Encounter (Signed)
Scheduled appt per 2/15 los.  Left a voice message of the appt dates and time.

## 2019-09-28 ENCOUNTER — Ambulatory Visit
Admission: RE | Admit: 2019-09-28 | Discharge: 2019-09-28 | Disposition: A | Discharge status: Home / Self Care | Payer: Medicare Other | Source: Ambulatory Visit | Attending: Radiation Oncology | Admitting: Radiation Oncology

## 2019-09-28 ENCOUNTER — Other Ambulatory Visit: Payer: Self-pay

## 2019-09-28 DIAGNOSIS — C210 Malignant neoplasm of anus, unspecified: Secondary | ICD-10-CM | POA: Diagnosis not present

## 2019-09-28 DIAGNOSIS — Z510 Encounter for antineoplastic radiation therapy: Secondary | ICD-10-CM | POA: Diagnosis not present

## 2019-09-28 NOTE — Progress Notes (Signed)
Current treatment 5FU/Mitomycin and RT began 2/15, PET scan on 09/22/2019 showed questionable thyroid lesion, recommendation was to refer for Thyroid US and possible biopsy.

## 2019-09-29 ENCOUNTER — Ambulatory Visit: Payer: Medicare Other

## 2019-09-29 NOTE — Progress Notes (Signed)
Patient calls with concern over anal bleeding.  This has been on-going.  I explained to her the Radiation Treatment is trying to shrink the mass, surround skin is sometimes affected, as well as blood vessels.  Reassured her that some bleeding is expected.  She has Radiation treatment tomorrow and I have encouraged her to mention this to them as well.  She verbalized an understanding and appreciated the call back.

## 2019-09-30 ENCOUNTER — Inpatient Hospital Stay: Payer: Medicare Other

## 2019-09-30 ENCOUNTER — Other Ambulatory Visit: Payer: Self-pay

## 2019-09-30 ENCOUNTER — Ambulatory Visit
Admission: RE | Admit: 2019-09-30 | Discharge: 2019-09-30 | Disposition: A | Discharge status: Home / Self Care | Payer: Medicare Other | Source: Ambulatory Visit | Attending: Radiation Oncology | Admitting: Radiation Oncology

## 2019-09-30 ENCOUNTER — Other Ambulatory Visit (HOSPITAL_COMMUNITY): Payer: Medicare Other

## 2019-09-30 VITALS — BP 141/44 | HR 93 | Temp 98.3°F | Resp 18

## 2019-09-30 DIAGNOSIS — C210 Malignant neoplasm of anus, unspecified: Secondary | ICD-10-CM

## 2019-09-30 DIAGNOSIS — K625 Hemorrhage of anus and rectum: Secondary | ICD-10-CM | POA: Diagnosis not present

## 2019-09-30 DIAGNOSIS — I739 Peripheral vascular disease, unspecified: Secondary | ICD-10-CM | POA: Diagnosis not present

## 2019-09-30 DIAGNOSIS — Z510 Encounter for antineoplastic radiation therapy: Secondary | ICD-10-CM | POA: Diagnosis not present

## 2019-09-30 DIAGNOSIS — D61818 Other pancytopenia: Secondary | ICD-10-CM | POA: Diagnosis not present

## 2019-09-30 DIAGNOSIS — Z5111 Encounter for antineoplastic chemotherapy: Secondary | ICD-10-CM | POA: Diagnosis not present

## 2019-09-30 DIAGNOSIS — I2510 Atherosclerotic heart disease of native coronary artery without angina pectoris: Secondary | ICD-10-CM | POA: Diagnosis not present

## 2019-09-30 MED ORDER — HEPARIN SOD (PORK) LOCK FLUSH 100 UNIT/ML IV SOLN
500.0000 [IU] | Freq: Once | INTRAVENOUS | Status: AC
  Administered 2019-09-30: 250 [IU] via INTRAVENOUS
  Filled 2019-09-30: qty 5

## 2019-09-30 MED ORDER — SODIUM CHLORIDE 0.9% FLUSH
10.0000 mL | INTRAVENOUS | Status: DC | PRN
  Administered 2019-09-30: 16:00:00 3 mL via INTRAVENOUS
  Filled 2019-09-30: qty 10

## 2019-09-30 NOTE — Progress Notes (Signed)
Pt here for patient teaching.  Pt given Radiation and You booklet.  Reviewed areas of pertinence such as diarrhea, fatigue, hair loss, nausea and vomiting, sexual and fertility changes, skin changes and urinary and bladder changes . Pt able to give teach back of to pat skin, use unscented/gentle soap, use baby wipes, have Imodium on hand, drink plenty of water and sitz bath,avoid applying anything to skin within 4 hours of treatment. Pt verbalizes understanding of information given and will contact nursing with any questions or concerns.     Deamber Buckhalter M. Danaly Bari RN, BSN      

## 2019-09-30 NOTE — Patient Instructions (Signed)

## 2019-09-30 NOTE — Progress Notes (Signed)
Treatment orders not signed. Placed orders for flushes for PICC.

## 2019-10-03 ENCOUNTER — Ambulatory Visit: Payer: Medicare Other | Admitting: Nurse Practitioner

## 2019-10-03 ENCOUNTER — Other Ambulatory Visit: Payer: Medicare Other

## 2019-10-03 ENCOUNTER — Ambulatory Visit
Admission: RE | Admit: 2019-10-03 | Discharge: 2019-10-03 | Disposition: A | Discharge status: Home / Self Care | Payer: Medicare Other | Source: Ambulatory Visit | Attending: Radiation Oncology | Admitting: Radiation Oncology

## 2019-10-03 ENCOUNTER — Ambulatory Visit: Payer: Medicare Other

## 2019-10-03 ENCOUNTER — Other Ambulatory Visit: Payer: Self-pay

## 2019-10-03 DIAGNOSIS — Z510 Encounter for antineoplastic radiation therapy: Secondary | ICD-10-CM | POA: Diagnosis not present

## 2019-10-03 DIAGNOSIS — C210 Malignant neoplasm of anus, unspecified: Secondary | ICD-10-CM | POA: Diagnosis not present

## 2019-10-04 ENCOUNTER — Telehealth: Payer: Self-pay | Admitting: Nurse Practitioner

## 2019-10-04 ENCOUNTER — Inpatient Hospital Stay: Payer: Medicare Other

## 2019-10-04 ENCOUNTER — Ambulatory Visit: Payer: Medicare Other

## 2019-10-04 DIAGNOSIS — C210 Malignant neoplasm of anus, unspecified: Secondary | ICD-10-CM

## 2019-10-04 NOTE — Progress Notes (Signed)
Patient calls back stating she told me the wrong thing.  She is not constipated, she is having diarrhea.  I instructed her to get OTC Imodium take 2 pills right away, then one with every diarrhea stool.  She verbalized an understanding of instructions given.

## 2019-10-04 NOTE — Progress Notes (Signed)
Patient calls with complaint of constipation, is currently not using a stool softener or laxative.  She has Miralax at home but I have suggested to get Senokot OTC as well.  She is currently taking Zofran for nausea which can be constipating as well as pain medication.  I explained to her that she needs to get on a regimen of taking either the Miralax and/or Senokot every day.  She verbalized an understanding and states she will try it.  Encouraged her to call back if this is not helping.

## 2019-10-04 NOTE — Telephone Encounter (Signed)
Patient canceled her lab and radiation today due to nausea and diarrhea. These symptoms started last week but she "worked through them" then improved over the weekend. Diarrhea restarted 2/22 and into today, had episodes most of the day until she spoke to [facility] who told her to start imodium which is helping already. She remains able to drink fluids. Denies mucositis, fever, chills. I will schedule f/u tomorrow after radiation and lab, to see if she needs supportive care IV fluids and help manage side effects. She understands and is aware of the appointments. She appreciates the call. Schedule message sent.   Cira Rue, NP

## 2019-10-05 ENCOUNTER — Inpatient Hospital Stay (HOSPITAL_BASED_OUTPATIENT_CLINIC_OR_DEPARTMENT_OTHER): Payer: Medicare Other | Admitting: Nurse Practitioner

## 2019-10-05 ENCOUNTER — Other Ambulatory Visit: Payer: Self-pay

## 2019-10-05 ENCOUNTER — Inpatient Hospital Stay: Payer: Medicare Other

## 2019-10-05 ENCOUNTER — Encounter: Payer: Self-pay | Admitting: Nurse Practitioner

## 2019-10-05 ENCOUNTER — Ambulatory Visit
Admission: RE | Admit: 2019-10-05 | Discharge: 2019-10-05 | Disposition: A | Discharge status: Home / Self Care | Payer: Medicare Other | Source: Ambulatory Visit | Attending: Radiation Oncology | Admitting: Radiation Oncology

## 2019-10-05 VITALS — BP 91/64 | HR 99 | Temp 98.7°F | Resp 17 | Ht 62.0 in | Wt 118.6 lb

## 2019-10-05 DIAGNOSIS — D61818 Other pancytopenia: Secondary | ICD-10-CM | POA: Diagnosis not present

## 2019-10-05 DIAGNOSIS — C210 Malignant neoplasm of anus, unspecified: Secondary | ICD-10-CM

## 2019-10-05 DIAGNOSIS — Z510 Encounter for antineoplastic radiation therapy: Secondary | ICD-10-CM | POA: Diagnosis not present

## 2019-10-05 DIAGNOSIS — I2510 Atherosclerotic heart disease of native coronary artery without angina pectoris: Secondary | ICD-10-CM | POA: Diagnosis not present

## 2019-10-05 DIAGNOSIS — I739 Peripheral vascular disease, unspecified: Secondary | ICD-10-CM | POA: Diagnosis not present

## 2019-10-05 DIAGNOSIS — K625 Hemorrhage of anus and rectum: Secondary | ICD-10-CM | POA: Diagnosis not present

## 2019-10-05 DIAGNOSIS — Z5111 Encounter for antineoplastic chemotherapy: Secondary | ICD-10-CM | POA: Diagnosis not present

## 2019-10-05 LAB — CBC WITH DIFFERENTIAL (CANCER CENTER ONLY)
Abs Immature Granulocytes: 0.02 10*3/uL (ref 0.00–0.07)
Basophils Absolute: 0 10*3/uL (ref 0.0–0.1)
Basophils Relative: 0 %
Eosinophils Absolute: 0 10*3/uL (ref 0.0–0.5)
Eosinophils Relative: 0 %
HCT: 36.8 % (ref 36.0–46.0)
Hemoglobin: 11.4 g/dL — ABNORMAL LOW (ref 12.0–15.0)
Immature Granulocytes: 1 %
Lymphocytes Relative: 11 %
Lymphs Abs: 0.4 10*3/uL — ABNORMAL LOW (ref 0.7–4.0)
MCH: 29 pg (ref 26.0–34.0)
MCHC: 31 g/dL (ref 30.0–36.0)
MCV: 93.6 fL (ref 80.0–100.0)
Monocytes Absolute: 0.2 10*3/uL (ref 0.1–1.0)
Monocytes Relative: 7 %
Neutro Abs: 3 10*3/uL (ref 1.7–7.7)
Neutrophils Relative %: 81 %
Platelet Count: 142 10*3/uL — ABNORMAL LOW (ref 150–400)
RBC: 3.93 MIL/uL (ref 3.87–5.11)
RDW: 13.4 % (ref 11.5–15.5)
WBC Count: 3.7 10*3/uL — ABNORMAL LOW (ref 4.0–10.5)
nRBC: 0 % (ref 0.0–0.2)

## 2019-10-05 LAB — CMP (CANCER CENTER ONLY)
ALT: 14 U/L (ref 0–44)
AST: 20 U/L (ref 15–41)
Albumin: 3.2 g/dL — ABNORMAL LOW (ref 3.5–5.0)
Alkaline Phosphatase: 137 U/L — ABNORMAL HIGH (ref 38–126)
Anion gap: 9 (ref 5–15)
BUN: 11 mg/dL (ref 8–23)
CO2: 31 mmol/L (ref 22–32)
Calcium: 9.6 mg/dL (ref 8.9–10.3)
Chloride: 104 mmol/L (ref 98–111)
Creatinine: 0.7 mg/dL (ref 0.44–1.00)
GFR, Est AFR Am: >60 mL/min (ref >60)
GFR, Estimated: >60 mL/min (ref >60)
Glucose, Bld: 123 mg/dL — ABNORMAL HIGH (ref 70–99)
Potassium: 3.9 mmol/L (ref 3.5–5.1)
Sodium: 144 mmol/L (ref 135–145)
Total Bilirubin: 0.3 mg/dL (ref 0.3–1.2)
Total Protein: 6.5 g/dL (ref 6.5–8.1)

## 2019-10-05 MED ORDER — DIPHENOXYLATE-ATROPINE 2.5-0.025 MG PO TABS: 1.0000 | ORAL_TABLET | Freq: Four times a day (QID) | ORAL | 0 refills | Status: DC | PRN

## 2019-10-05 NOTE — Progress Notes (Signed)
Patricia [name]   [phone] [phone]   Clinic Follow up Note   Patient Care Team: Colon Branch, MD as PCP - General (Internal Medicine) Sherren Mocha, MD as PCP - Cardiology (Cardiology) Rigoberto Noel, MD as Consulting Physician (Pulmonary Disease) Zadie Rhine Clent Demark, MD as Consulting Physician (Ophthalmology) Burtis Junes, NP as Nurse Practitioner (Cardiology) Eleftheria Shipper, MD as Consulting Physician (Gastroenterology) Jonnie Finner, RN as Oncology Nurse Navigator 10/05/2019  CHIEF COMPLAINT: F/u anal cancer, diarrhea   CURRENT THERAPY: Concurrent chemoRT with 5FU/mitomycin starting 09/26/19   INTERVAL HISTORY: Patricia [name] presents for symptom management visit. She began chemoRT on 09/26/19. She developed nausea and diarrhea on day 5 but did not require medication. This improved over the weekend when she had 2 days off RT. She had recurrent diarrhea on day 8 and most of day 9 before she called [place] and was recommended to start imodium. She canceled radiation due to this. Today, she had 2 episodes of diarrhea and took imodium twice. She takes zofran which is helpful, no vomiting. No mucositis. She is not eating that well, but drinking Pedialyte and Ensure. Energy is decent, she is out of bed and mobile at home. Anal pain continues 8/10, percocet is effective. She is taking this approx every 6 hours. She is a little weak on standing after taking pain meds. No fall. Denies fever, chills, cough, chest pain, dyspnea.     MEDICAL HISTORY:  Past Medical History:  Diagnosis Date  . Abnormal LFTs   . Allergic rhinitis   . Anxiety and depression    son commited suicide 2010  . 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 CAD (coronary artery disease)    s/p CABG  . Carotid artery disease ([place])    Moderate - f/u dopplers needed 11/2012  . 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 Zadie Rhine  . COPD (chronic  obstructive pulmonary disease) ([place])   . HTN (hypertension)    intolerant to ACEi (cough)  . Hyperlipidemia   . Osteopenia   . PAD (peripheral artery disease) ([place])    Carotid dz as  below, and at [facility] 11/12:  critical stenosis noted just below the sheath site leading into a totally occluded SFA and a patent deep femoral artery  . Panic attacks   . Thyroid nodule     SURGICAL HISTORY: Past Surgical History:  Procedure Laterality Date  . BIOPSY  09/07/2019   Procedure: BIOPSY;  Surgeon: Jackquline Denmark, MD;  Location: [facility];  Service: Gastroenterology;;  . CORONARY ANGIOPLASTY WITH STENT PLACEMENT     "1; makes total of 3" (07/05/2012)  . CORONARY ARTERY BYPASS GRAFT N/A 12/15/2013   Procedure: CORONARY ARTERY BYPASS GRAFTING (CABG);  Surgeon: Gaye Pollack, MD;  Location: [facility];  Service: Open Heart Surgery;  Laterality: N/A;  Times 3 using left internal mammary artery and endoscopically harvested right saphenous vein   . FLEXIBLE SIGMOIDOSCOPY N/A 09/07/2019   Procedure: FLEXIBLE SIGMOIDOSCOPY;  Surgeon: Jackquline Denmark, MD;  Location: [facility];  Service: Gastroenterology;  Laterality: N/A;  . INTRAOPERATIVE TRANSESOPHAGEAL ECHOCARDIOGRAM N/A 12/15/2013   Procedure: INTRAOPERATIVE TRANSESOPHAGEAL ECHOCARDIOGRAM;  Surgeon: Gaye Pollack, MD;  Location: [facility];  Service: Open Heart Surgery;  Laterality: N/A;  . 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  LEFT HEART CATHETERIZATION WITH CORONARY ANGIOGRAM N/A 07/02/2011   Procedure: LEFT HEART CATHETERIZATION WITH CORONARY ANGIOGRAM;  Surgeon: Sherren Mocha, MD;  Location: [facility] CATH LAB;  Service: Cardiovascular;  Laterality: N/A;  . LEFT HEART CATHETERIZATION WITH CORONARY ANGIOGRAM N/A 07/05/2012   Procedure: LEFT HEART CATHETERIZATION WITH CORONARY ANGIOGRAM;  Surgeon: Sherren Mocha, MD;  Location: [facility] CATH LAB;  Service: Cardiovascular;   Laterality: N/A;  . LEFT HEART CATHETERIZATION WITH CORONARY ANGIOGRAM N/A 11/28/2013   Procedure: LEFT HEART CATHETERIZATION WITH CORONARY ANGIOGRAM;  Surgeon: Blane Ohara, MD;  Location: [facility] CATH LAB;  Service: Cardiovascular;  Laterality: N/A;  . PERCUTANEOUS CORONARY INTERVENTION-BALLOON ONLY  07/02/2011   Procedure: PERCUTANEOUS CORONARY INTERVENTION-BALLOON ONLY;  Surgeon: Sherren Mocha, MD;  Location: [facility] CATH LAB;  Service: Cardiovascular;;  . PERCUTANEOUS CORONARY INTERVENTION-BALLOON ONLY  07/05/2012   Procedure: PERCUTANEOUS CORONARY INTERVENTION-BALLOON ONLY;  Surgeon: Sherren Mocha, MD;  Location: [facility] C[facility] LAB;  Service: Cardiovascular;;  . VAGINAL HYSTERECTOMY  ~ 1976   no oophorectomy    I have reviewed the social history and family history with the patient and they are unchanged from previous note.  ALLERGIES:  is allergic to ace inhibitors.  MEDICATIONS:  Current Outpatient Medications  Medication Sig Dispense Refill  . amLODipine (NORVASC) 10 MG tablet Take 1 tablet (10 mg total) by mouth daily. 90 tablet 1  . atorvastatin (LIPITOR) 40 MG tablet Take 1 tablet (40 mg total) by mouth daily. 90 tablet 1  . budesonide-formoterol (SYMBICORT) 160-4.5 MCG/ACT inhaler Inhale 2 puffs into the lungs 2 (two) times daily. (Patient taking differently: Inhale 2 puffs into the lungs daily as needed (sob and wheezing). ) 1 Inhaler 3  . Cholecalciferol (VITAMIN D3) 1000 UNITS CAPS Take 1,000 Units by mouth daily.    . clonazePAM (KLONOPIN) 0.5 MG tablet TAKE 1 TABLET BY MOUTH IN THE MORNING AS NEEDED FOR ANXIETY AND 1 & 1/2 TO 2 (ONE & ONE-HALF TO TWO) AT BEDTIME AS NEEDED FOR SLEEP (Patient taking differently: Take 0.25 mg by mouth at bedtime as needed for anxiety (sleep). ) 60 tablet 3  . diphenoxylate-atropine (LOMOTIL) 2.5-0.025 MG tablet Take 1 tablet by mouth 4 (four) times daily as needed for diarrhea or loose stools. 30 tablet 0  . FLUoxetine (PROZAC) 20 MG capsule Take 2 capsules  (40 mg total) by mouth daily. 180 capsule 1  . losartan (COZAAR) 100 MG tablet Take 1 tablet by mouth once daily 90 tablet 3  . ondansetron (ZOFRAN) 8 MG tablet Take 1 tablet (8 mg total) by mouth every 8 (eight) hours as needed for nausea or vomiting. 30 tablet 1  . oxyCODONE-acetaminophen (PERCOCET/ROXICET) 5-325 MG tablet Take 1 tablet by mouth every 6 (six) hours as needed for severe pain. Do not drive while taking. 80 tablet 0  . prochlorperazine (COMPAZINE) 10 MG tablet Take 1 tablet (10 mg total) by mouth every 6 (six) hours as needed. 60 tablet 1  . traMADol (ULTRAM) 50 MG tablet Take 1 tablet (50 mg total) by mouth every 6 (six) hours as needed for moderate pain or severe pain. 20 tablet 0   No current facility-administered medications for this visit.    PHYSICAL EXAMINATION:  Vitals:   10/05/19 1430  BP: 91/64  Pulse: 99  Resp: 17  Temp: 98.7 F (37.1 C)  SpO2: 95%   Filed Weights   10/05/19 1430  Weight: 118 lb 9.6 oz (53.8 kg)    GENERAL:alert, no distress, uncomfortable sitting in wheelchair  SKIN: no rash  EYES:  sclera clear OROPHARYNX:no thrush or ulcers. Dry oral mucosa  LUNGS: distant, with normal breathing effort HEART: regular rate & rhythm, no lower extremity edema ABDOMEN: positive bowel sounds, nontender  NEURO: alert & oriented x 3 with fluent speech RUE PICC, site C/D/I   LABORATORY DATA:  I have reviewed the data as listed CBC Latest Ref Rng & Units 10/05/2019 09/26/2019 09/20/2019  WBC 4.0 - 10.5 K/uL 3.7(L) 10.1 7.5  Hemoglobin 12.0 - 15.0 g/dL 11.4(L) 13.8 13.6  Hematocrit 36.0 - 46.0 % 36.8 43.5 43.2  Platelets 150 - 400 K/uL 142(L) 274 276     CMP Latest Ref Rng & Units 10/05/2019 09/26/2019 09/20/2019  Glucose 70 - 99 mg/dL 123(H) 145(H) 128(H)  BUN 8 - 23 mg/dL 11 11 10   Creatinine 0.44 - 1.00 mg/dL 0.70 0.74 0.71  Sodium 135 - 145 mmol/L 144 146(H) 146(H)  Potassium 3.5 - 5.1 mmol/L 3.9 3.8 3.7  Chloride 98 - 111 mmol/L 104 108 105  CO2  22 - 32 mmol/L 31 28 29   Calcium 8.9 - 10.3 mg/dL 9.6 10.2 9.6  Total Protein 6.5 - 8.1 g/dL 6.5 7.0 6.8  Total Bilirubin 0.3 - 1.2 mg/dL 0.3 0.3 0.4  Alkaline Phos 38 - 126 U/L 137(H) 156(H) 151(H)  AST 15 - 41 U/L 20 20 22   ALT 0 - 44 U/L 14 15 16       RADIOGRAPHIC STUDIES: I have personally reviewed the radiological images as listed and agreed with the findings in the report. No results found.   ASSESSMENT & PLAN:  1. Anal cancer  09/06/2019 CT abdomen/pelvis-complex 5.1 cm x 3.2 cm x 4.1 cm posterior left perirectal fluid collection/abscess.  09/07/2019 flexible sigmoidoscopy by Dr. Sim Boast cm x 5 cm hard fungating anal mass located predominantly at the left anterior-lateral area and extending to the dentate line with minor anal stenosis. 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  Cycle 1 day 1 dose-reduced 5FU/mitomycin and concurrent RT 09/26/19  2. Rectal bleedingand painsecondary to #1  3. CAD/CABG 4. Hypertension - BP 91/64 on day 10 chemoRT, instructed to hold losartan and amlodipine as of 10/05/19 5. Hyperlipidemia 6. Carotid disease 7. COPD 8. Anxiety/depression 9. Nausea and diarrhea, secondary to chemoRT starting day 5, imodium partially effective. Started lomotil 10/05/19.  10. Mild pancytopenia secondary to chemo, on day 10 of chemoRT WBC 3.7, Hgb 11.4, PLT 142   Disposition:  Patricia [name] appears stable. She is day 10 of 5FU/mitomycin and RT. She developed diarrhea and mild nausea. Imodium is partially effective, I recommend to add lomotil which I prescribed to her pharmacy. Continue zofran and add compazine PRN. I encouraged her to drink more and add supplements. BP is soft, 91/64 today. I recommend to hold losartan and amlodipine for now with plans to reassess at next office visit. She can't stay for IV fluids today due to transportation. She will return 2/26 for fluids and PICC d/c. My nurse performed PICC line care today.     CBC show mild pancytopenia which is expected from chemotherapy. CMP is stable.  She knows to monitor for fever, chills, dyspnea, fatigue, or worsening n/v/d not managed with home meds. She will return for fluids this week, and scheduled lab and office visit on 10/10/19.   No problem-specific Assessment & Plan notes found for this encounter.   No orders of the defined types were placed in this encounter.  All questions were answered. The patient knows to call the clinic with any  problems, questions or concerns. No barriers to learning was detected.     Alla Feeling, NP 10/05/19

## 2019-10-06 ENCOUNTER — Other Ambulatory Visit: Payer: Self-pay

## 2019-10-06 ENCOUNTER — Ambulatory Visit: Admission: RE | Admit: 2019-10-06 | Payer: Medicare Other | Source: Ambulatory Visit

## 2019-10-06 ENCOUNTER — Telehealth: Payer: Self-pay | Admitting: Nurse Practitioner

## 2019-10-06 ENCOUNTER — Ambulatory Visit
Admission: RE | Admit: 2019-10-06 | Discharge: 2019-10-06 | Disposition: A | Discharge status: Home / Self Care | Payer: Medicare Other | Source: Ambulatory Visit

## 2019-10-06 DIAGNOSIS — C210 Malignant neoplasm of anus, unspecified: Secondary | ICD-10-CM | POA: Diagnosis not present

## 2019-10-06 DIAGNOSIS — Z510 Encounter for antineoplastic radiation therapy: Secondary | ICD-10-CM | POA: Diagnosis not present

## 2019-10-06 NOTE — Telephone Encounter (Signed)
Scheduled appt per 2/24 los.  Left a vm of the appt date and time. 

## 2019-10-07 ENCOUNTER — Inpatient Hospital Stay: Payer: Medicare Other

## 2019-10-07 ENCOUNTER — Ambulatory Visit
Admission: RE | Admit: 2019-10-07 | Discharge: 2019-10-07 | Disposition: A | Discharge status: Home / Self Care | Payer: Medicare Other | Source: Ambulatory Visit | Attending: Radiation Oncology | Admitting: Radiation Oncology

## 2019-10-07 DIAGNOSIS — C210 Malignant neoplasm of anus, unspecified: Secondary | ICD-10-CM

## 2019-10-07 DIAGNOSIS — K625 Hemorrhage of anus and rectum: Secondary | ICD-10-CM | POA: Diagnosis not present

## 2019-10-07 DIAGNOSIS — I739 Peripheral vascular disease, unspecified: Secondary | ICD-10-CM | POA: Diagnosis not present

## 2019-10-07 DIAGNOSIS — I2510 Atherosclerotic heart disease of native coronary artery without angina pectoris: Secondary | ICD-10-CM | POA: Diagnosis not present

## 2019-10-07 DIAGNOSIS — D61818 Other pancytopenia: Secondary | ICD-10-CM | POA: Diagnosis not present

## 2019-10-07 DIAGNOSIS — Z5111 Encounter for antineoplastic chemotherapy: Secondary | ICD-10-CM | POA: Diagnosis not present

## 2019-10-07 DIAGNOSIS — Z510 Encounter for antineoplastic radiation therapy: Secondary | ICD-10-CM | POA: Diagnosis not present

## 2019-10-07 MED ORDER — SODIUM CHLORIDE 0.9 % IV SOLN
Freq: Once | INTRAVENOUS | Status: AC
  Filled 2019-10-07: qty 250

## 2019-10-07 NOTE — Patient Instructions (Signed)
PICC Removal, Adult, Care After This sheet gives you information about how to care for yourself after your procedure. Your health care provider may also give you more specific instructions. If you have problems or questions, contact your health care provider. What can I expect after the procedure? After your procedure, it is common to have:  Tenderness or soreness.  Redness, swelling, or a scab where the PICC was removed (exit site). Follow these instructions at home: For the first 24 hours after the procedure   Keep the bandage (dressing) on the exit site clean and dry. Do not remove the dressing until your health care provider tells you to do so.  Check your arm often for signs and symptoms of an infection. Check for: ? A red streak that spreads away from the dressing. ? Blood or fluid that you can see on the dressing. ? More redness or swelling.  Do not lift anything heavy or do activities that require great effort until your health care provider says it is okay. You should avoid: ? Lifting weights. ? Yard work. ? Any physical activity with repetitive arm movement.  Watch closely for any signs of an air bubble in the vein (air embolism). This is a rare but serious complication. If you have signs of air embolism, call 911 immediately and lie down on your left side to keep the air from moving into the lungs. Signs of an air embolism include: ? Difficulty breathing. ? Chest pain. ? Coughing or wheezing. ? Skin that is pale, blue, cold, or clammy. ? Rapid pulse. ? Rapid breathing. ? Fainting. After 24 Hours have passed:  Remove your dressing as told by your health care provider. Make sure you wash your hands with soap and water before and after you change the dressing. If soap and water are not available, use hand sanitizer.  Return to your normal activities as told by your health care provider.  A small scab may develop over the exit site. Do not pick at the scab.  When  bathing or showering, gently wash the exit site with soap and water. Pat it dry.  Watch for signs of infection, such as: ? Fever or chills. ? Swollen glands under the arm. ? More redness, swelling, or soreness in the arm. ? Blood, fluid, or pus coming from the exit site. ? Warmth or a bad smell at the exit site. ? A red streak spreading away from the exit site. General instructions  Take over-the-counter and prescription medicines only as told by your health care provider. Do not take any new medicines without checking with your health care provider first.  If you were prescribed an antibiotic medicine, apply or take it as told by your health care provider. Do not stop using the antibiotic even if your condition improves.  Keep all follow-up visits as told by your health care provider. This is important. Contact a health care provider if:  You have a fever or chills.  You have soreness, redness, or swelling on your exit site, and it gets worse.  You have swollen glands under your arm.  You have any of the following symptoms at your exit site: ? Blood, fluid, or pus. ? Unusual warmth. ? A bad smell. ? A red streak spreading away from the exit site. Get help right away if:  You have numbness or tingling in your fingers, hand, or arm.  Your arm looks blue and feels cold.  You have signs of an air embolism, such   as: ? Difficulty breathing. ? Chest pain. ? Coughing or wheezing. ? Skin that is pale, blue, cold, or clammy. ? Rapid pulse. ? Rapid breathing. ? Fainting. These symptoms may represent a serious problem that is an emergency. Do not wait to see if the symptoms will go away. Get medical help right away. Call your local emergency services (911 in the [place] Do not drive yourself to the hospital. Summary  After your procedure, it is common to have tenderness or soreness, redness, swelling, or a scab at the exit site.  Keep the dressing over the exit site clean and dry.  Do not remove the dressing until your health care provider tells you to do so.  Do not lift anything heavy or do activities that require great effort until your health care provider says it is okay.  Watch closely for any signs of an air embolism. If you have signs of air embolism, call 911 immediately and lie down on your left side. This information is not intended to replace advice given to you by your health care provider. Make sure you discuss any questions you have with your health care provider. Document Revised: 07/10/2017 Document Reviewed: 09/23/2016 Elsevier Patient Education  2020 [employer] Inc.  

## 2019-10-07 NOTE — Progress Notes (Signed)
Per Dr. Benay Spice ok to stop IV fluids @ 1630 - pt's PICC line to be DC'd, appropriate staff available at that time.

## 2019-10-07 NOTE — Progress Notes (Signed)
1635 -  Right UA single lumen power PICC pulled per order and per protocol.  Catheter measured 44 cm in length intact.  Site unremarkable.  Vaseline gauze applied with 4x4 gauze.  Instructed pt to observe for any signs of redness, swollen, and/or pain at catheter site, and to go to the nearest ER for evaluation.  Pt verbalized understanding.  Pt understood to leave pressure dressing on for 24 hours.  Pt lied flat for 30 min for observation post PICC d/c.

## 2019-10-10 ENCOUNTER — Inpatient Hospital Stay: Payer: Medicare Other | Admitting: Nurse Practitioner

## 2019-10-10 ENCOUNTER — Other Ambulatory Visit: Payer: Self-pay

## 2019-10-10 ENCOUNTER — Inpatient Hospital Stay: Payer: Medicare Other

## 2019-10-10 ENCOUNTER — Telehealth: Payer: Self-pay | Admitting: Nurse Practitioner

## 2019-10-10 ENCOUNTER — Ambulatory Visit
Admission: RE | Admit: 2019-10-10 | Discharge: 2019-10-10 | Disposition: A | Discharge status: Home / Self Care | Payer: Medicare Other | Source: Ambulatory Visit | Attending: Radiation Oncology | Admitting: Radiation Oncology

## 2019-10-10 DIAGNOSIS — Z510 Encounter for antineoplastic radiation therapy: Secondary | ICD-10-CM | POA: Diagnosis not present

## 2019-10-10 DIAGNOSIS — C210 Malignant neoplasm of anus, unspecified: Secondary | ICD-10-CM | POA: Diagnosis not present

## 2019-10-10 NOTE — Telephone Encounter (Signed)
R/s appt per 3/1 sch message - per pt doesn't understand why she needs the appt .message sent to RN to call pt .

## 2019-10-11 ENCOUNTER — Ambulatory Visit
Admission: RE | Admit: 2019-10-11 | Discharge: 2019-10-11 | Disposition: A | Discharge status: Home / Self Care | Payer: Medicare Other | Source: Ambulatory Visit | Attending: Radiation Oncology | Admitting: Radiation Oncology

## 2019-10-11 DIAGNOSIS — C210 Malignant neoplasm of anus, unspecified: Secondary | ICD-10-CM | POA: Diagnosis not present

## 2019-10-11 DIAGNOSIS — Z510 Encounter for antineoplastic radiation therapy: Secondary | ICD-10-CM | POA: Diagnosis not present

## 2019-10-12 ENCOUNTER — Inpatient Hospital Stay: Payer: Medicare Other | Admitting: Nurse Practitioner

## 2019-10-12 ENCOUNTER — Telehealth: Payer: Self-pay | Admitting: Nurse Practitioner

## 2019-10-12 ENCOUNTER — Ambulatory Visit: Payer: Medicare Other

## 2019-10-12 ENCOUNTER — Inpatient Hospital Stay: Payer: Medicare Other

## 2019-10-12 NOTE — Progress Notes (Signed)
Patient calls back along with her grandson on speaker phone.  She is taking the Lomotil and is till having episodes of diarrhea along with nausea.  She has Zofran at home to take as well.  I explained my concerns over her getting dehydrated. She states she is constantly sipping on Pedia-lite.  I stressed the importance of having lab work done along with Medical Oncology seeing her but she states there is no way for her to come in today.  I instructed her grandson that if she is not improving she needs to be evaluated either here at the [facility] or the ED.  He verbalized an understanding.  I have sent a scheduling message for lab and f/u either tomorrow or Friday of this week.

## 2019-10-12 NOTE — Progress Notes (Signed)
Patient calls stating that she is having explosive diarrhea this morning.  Instructed her to take two Imodium now and then one with each diarrhea episode.  She is scheduled to see Ned Card NP this morning and is sure she can't come for that.  She has RT at 1:00.  I told her to call me back about 12 to let me know how she is and if she thinks she can come for RT at 1:00 and then we can perhaps move her lab and follow up to this afternoon with Cira Rue NP.  She verbalized an understanding.

## 2019-10-12 NOTE — Telephone Encounter (Signed)
R.s appt per 3/3 sch message - pt is aware of new appt date and time

## 2019-10-13 ENCOUNTER — Other Ambulatory Visit: Payer: Self-pay

## 2019-10-13 ENCOUNTER — Ambulatory Visit
Admission: RE | Admit: 2019-10-13 | Discharge: 2019-10-13 | Disposition: A | Discharge status: Home / Self Care | Payer: Medicare Other | Source: Ambulatory Visit | Attending: Radiation Oncology | Admitting: Radiation Oncology

## 2019-10-13 DIAGNOSIS — C210 Malignant neoplasm of anus, unspecified: Secondary | ICD-10-CM | POA: Diagnosis not present

## 2019-10-13 DIAGNOSIS — Z510 Encounter for antineoplastic radiation therapy: Secondary | ICD-10-CM | POA: Diagnosis not present

## 2019-10-14 ENCOUNTER — Other Ambulatory Visit: Payer: Self-pay

## 2019-10-14 ENCOUNTER — Inpatient Hospital Stay: Payer: Medicare Other | Attending: Nurse Practitioner

## 2019-10-14 ENCOUNTER — Inpatient Hospital Stay: Payer: Medicare Other | Admitting: Nurse Practitioner

## 2019-10-14 ENCOUNTER — Ambulatory Visit
Admission: RE | Admit: 2019-10-14 | Discharge: 2019-10-14 | Disposition: A | Discharge status: Home / Self Care | Payer: Medicare Other | Source: Ambulatory Visit | Attending: Radiation Oncology | Admitting: Radiation Oncology

## 2019-10-14 DIAGNOSIS — C210 Malignant neoplasm of anus, unspecified: Secondary | ICD-10-CM | POA: Diagnosis not present

## 2019-10-14 DIAGNOSIS — K625 Hemorrhage of anus and rectum: Secondary | ICD-10-CM | POA: Insufficient documentation

## 2019-10-14 DIAGNOSIS — I2510 Atherosclerotic heart disease of native coronary artery without angina pectoris: Secondary | ICD-10-CM | POA: Insufficient documentation

## 2019-10-14 DIAGNOSIS — F418 Other specified anxiety disorders: Secondary | ICD-10-CM | POA: Insufficient documentation

## 2019-10-14 DIAGNOSIS — I10 Essential (primary) hypertension: Secondary | ICD-10-CM | POA: Insufficient documentation

## 2019-10-14 DIAGNOSIS — Z510 Encounter for antineoplastic radiation therapy: Secondary | ICD-10-CM | POA: Diagnosis not present

## 2019-10-14 DIAGNOSIS — Z5111 Encounter for antineoplastic chemotherapy: Secondary | ICD-10-CM | POA: Insufficient documentation

## 2019-10-14 DIAGNOSIS — R197 Diarrhea, unspecified: Secondary | ICD-10-CM | POA: Insufficient documentation

## 2019-10-14 DIAGNOSIS — J449 Chronic obstructive pulmonary disease, unspecified: Secondary | ICD-10-CM | POA: Insufficient documentation

## 2019-10-14 DIAGNOSIS — E785 Hyperlipidemia, unspecified: Secondary | ICD-10-CM | POA: Insufficient documentation

## 2019-10-14 DIAGNOSIS — R531 Weakness: Secondary | ICD-10-CM | POA: Insufficient documentation

## 2019-10-14 DIAGNOSIS — D61818 Other pancytopenia: Secondary | ICD-10-CM | POA: Insufficient documentation

## 2019-10-16 ENCOUNTER — Other Ambulatory Visit: Payer: Self-pay | Admitting: Oncology

## 2019-10-17 ENCOUNTER — Telehealth: Payer: Self-pay | Admitting: Oncology

## 2019-10-17 ENCOUNTER — Ambulatory Visit: Payer: Medicare Other

## 2019-10-17 ENCOUNTER — Other Ambulatory Visit: Payer: Self-pay | Admitting: *Deleted

## 2019-10-17 DIAGNOSIS — C210 Malignant neoplasm of anus, unspecified: Secondary | ICD-10-CM

## 2019-10-17 NOTE — Telephone Encounter (Signed)
Scheduled appt per 3/8 sch message - unable to reach pt - left message with appt dates and times

## 2019-10-17 NOTE — Progress Notes (Signed)
Per Dr. Benay Spice: Needs L/OV this week and L/OV/chemo 10/24/19. High priority scheduling message sent and order for PICC line placed.

## 2019-10-18 ENCOUNTER — Other Ambulatory Visit: Payer: Self-pay

## 2019-10-18 ENCOUNTER — Inpatient Hospital Stay (HOSPITAL_BASED_OUTPATIENT_CLINIC_OR_DEPARTMENT_OTHER): Payer: Medicare Other | Admitting: Oncology

## 2019-10-18 ENCOUNTER — Other Ambulatory Visit: Payer: Self-pay | Admitting: *Deleted

## 2019-10-18 ENCOUNTER — Inpatient Hospital Stay: Payer: Medicare Other

## 2019-10-18 ENCOUNTER — Other Ambulatory Visit: Payer: Medicare Other

## 2019-10-18 ENCOUNTER — Ambulatory Visit
Admission: RE | Admit: 2019-10-18 | Discharge: 2019-10-18 | Disposition: A | Discharge status: Home / Self Care | Payer: Medicare Other | Source: Ambulatory Visit | Attending: Radiation Oncology | Admitting: Radiation Oncology

## 2019-10-18 ENCOUNTER — Other Ambulatory Visit: Payer: Self-pay | Admitting: Oncology

## 2019-10-18 VITALS — BP 122/56 | HR 89 | Temp 98.7°F | Resp 20 | Ht 62.0 in | Wt 116.3 lb

## 2019-10-18 DIAGNOSIS — K625 Hemorrhage of anus and rectum: Secondary | ICD-10-CM | POA: Diagnosis not present

## 2019-10-18 DIAGNOSIS — F418 Other specified anxiety disorders: Secondary | ICD-10-CM | POA: Diagnosis not present

## 2019-10-18 DIAGNOSIS — R531 Weakness: Secondary | ICD-10-CM | POA: Diagnosis not present

## 2019-10-18 DIAGNOSIS — I10 Essential (primary) hypertension: Secondary | ICD-10-CM | POA: Diagnosis not present

## 2019-10-18 DIAGNOSIS — E785 Hyperlipidemia, unspecified: Secondary | ICD-10-CM | POA: Diagnosis not present

## 2019-10-18 DIAGNOSIS — Z5111 Encounter for antineoplastic chemotherapy: Secondary | ICD-10-CM | POA: Diagnosis not present

## 2019-10-18 DIAGNOSIS — D61818 Other pancytopenia: Secondary | ICD-10-CM | POA: Diagnosis not present

## 2019-10-18 DIAGNOSIS — J449 Chronic obstructive pulmonary disease, unspecified: Secondary | ICD-10-CM | POA: Diagnosis not present

## 2019-10-18 DIAGNOSIS — Z510 Encounter for antineoplastic radiation therapy: Secondary | ICD-10-CM | POA: Diagnosis not present

## 2019-10-18 DIAGNOSIS — C210 Malignant neoplasm of anus, unspecified: Secondary | ICD-10-CM

## 2019-10-18 DIAGNOSIS — R197 Diarrhea, unspecified: Secondary | ICD-10-CM | POA: Diagnosis not present

## 2019-10-18 DIAGNOSIS — I2510 Atherosclerotic heart disease of native coronary artery without angina pectoris: Secondary | ICD-10-CM | POA: Diagnosis not present

## 2019-10-18 LAB — CBC WITH DIFFERENTIAL (CANCER CENTER ONLY)
Abs Immature Granulocytes: 0.08 10*3/uL — ABNORMAL HIGH (ref 0.00–0.07)
Basophils Absolute: 0 10*3/uL (ref 0.0–0.1)
Basophils Relative: 1 %
Eosinophils Absolute: 0 10*3/uL (ref 0.0–0.5)
Eosinophils Relative: 0 %
HCT: 36.9 % (ref 36.0–46.0)
Hemoglobin: 11.3 g/dL — ABNORMAL LOW (ref 12.0–15.0)
Immature Granulocytes: 1 %
Lymphocytes Relative: 5 %
Lymphs Abs: 0.3 10*3/uL — ABNORMAL LOW (ref 0.7–4.0)
MCH: 29.4 pg (ref 26.0–34.0)
MCHC: 30.6 g/dL (ref 30.0–36.0)
MCV: 95.8 fL (ref 80.0–100.0)
Monocytes Absolute: 0.5 10*3/uL (ref 0.1–1.0)
Monocytes Relative: 8 %
Neutro Abs: 5 10*3/uL (ref 1.7–7.7)
Neutrophils Relative %: 85 %
Platelet Count: 278 10*3/uL (ref 150–400)
RBC: 3.85 MIL/uL — ABNORMAL LOW (ref 3.87–5.11)
RDW: 17 % — ABNORMAL HIGH (ref 11.5–15.5)
WBC Count: 6 10*3/uL (ref 4.0–10.5)
nRBC: 0 % (ref 0.0–0.2)

## 2019-10-18 LAB — BASIC METABOLIC PANEL - CANCER CENTER ONLY
Anion gap: 9 (ref 5–15)
BUN: 8 mg/dL (ref 8–23)
CO2: 29 mmol/L (ref 22–32)
Calcium: 9.2 mg/dL (ref 8.9–10.3)
Chloride: 104 mmol/L (ref 98–111)
Creatinine: 0.71 mg/dL (ref 0.44–1.00)
GFR, Est AFR Am: >60 mL/min (ref >60)
GFR, Estimated: >60 mL/min (ref >60)
Glucose, Bld: 142 mg/dL — ABNORMAL HIGH (ref 70–99)
Potassium: 4.2 mmol/L (ref 3.5–5.1)
Sodium: 142 mmol/L (ref 135–145)

## 2019-10-18 MED ORDER — OXYCODONE-ACETAMINOPHEN 5-325 MG PO TABS: 1.0000 | ORAL_TABLET | Freq: Four times a day (QID) | ORAL | 0 refills | Status: DC | PRN

## 2019-10-18 MED ORDER — DIPHENOXYLATE-ATROPINE 2.5-0.025 MG PO TABS: 1.0000 | ORAL_TABLET | Freq: Four times a day (QID) | ORAL | 0 refills | Status: DC | PRN

## 2019-10-18 NOTE — Progress Notes (Signed)
  [facility] OFFICE PROGRESS NOTE   Diagnosis: Anal cancer  INTERVAL HISTORY:   Patricia [name] began radiation and cycle 1 chemotherapy on 09/26/2019.  She continues daily radiation.  No nausea/vomiting or mouth sores.  She reports intermittent diarrhea.  She is drinking fluids, but eating very little solid food.  She feels weak.  She has exertional dyspnea.  No fever or cough.  Objective:  Vital signs in last 24 hours:  Blood pressure (!) 122/56, pulse 89, temperature 98.7 F (37.1 C), temperature source Temporal, resp. rate 20, height 5\' 2"  (1.575 m), weight 116 lb 4.8 oz (52.8 kg), SpO2 93 %.    HEENT: No thrush or ulcers Resp: Distant breath sounds, Pittore rhonchi at the left posterior base, no respiratory distress Cardio: Regular rhythm GI: No hepatosplenomegaly, nontender Vascular: No leg edema  Skin: Decreased mass at the left anal verge/anal margin and right anal margin, erythema at the perineum and gluteal fold without skin breakdown   Lab Results:  Lab Results  Component Value Date   WBC 6.0 10/18/2019   HGB 11.3 (L) 10/18/2019   HCT 36.9 10/18/2019   MCV 95.8 10/18/2019   PLT 278 10/18/2019   NEUTROABS 5.0 10/18/2019    CMP  Lab Results  Component Value Date   NA 142 10/18/2019   K 4.2 10/18/2019   CL 104 10/18/2019   CO2 29 10/18/2019   GLUCOSE 142 (H) 10/18/2019   BUN 8 10/18/2019   CREATININE 0.71 10/18/2019   CALCIUM 9.2 10/18/2019   PROT 6.5 10/05/2019   ALBUMIN 3.2 (L) 10/05/2019   AST 20 10/05/2019   ALT 14 10/05/2019   ALKPHOS 137 (H) 10/05/2019   BILITOT 0.3 10/05/2019   GFRNONAA >60 10/18/2019   GFRAA >60 10/18/2019     Medications: I have reviewed the patient's current medications.   Assessment/Plan:  1. Anal cancer  09/06/2019 CT abdomen/pelvis-complex 5.1 cm x 3.2 cm x 4.1 cm posterior left perirectal fluid collection/abscess.  09/07/2019 flexible sigmoidoscopy by Dr. Sim Boast cm x 5 cm hard fungating anal mass  located predominantly at the left anterior-lateral area and extending to the dentate line with minor anal stenosis. 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  Cycle 1 day 1 dose-reduced 5FU/mitomycin and concurrent RT 09/26/19  2. Rectal bleedingand painsecondary to #1  3. CAD/CABG 4. Hypertension - BP 91/64 on day 10 chemoRT, instructed to hold losartan and amlodipine as of 10/05/19 5. Hyperlipidemia 6. Carotid disease 7. COPD 8. Anxiety/depression 9. Nausea and diarrhea, secondary to chemoRT starting day 5, imodium partially effective. Started lomotil 10/05/19.  10. Mild pancytopenia secondary to chemo, on day 10 of chemoRT WBC 3.7, Hgb 11.4, PLT 142    Disposition: Patricia [name] is now at day 23 from the start of concurrent chemotherapy/radiation for treatment of anal cancer.  She tolerated the chemotherapy well.  The anal mass appears smaller.  The plan is to continue daily radiation.  She will return for the final week of 5-FU/mitomycin-C on 10/24/2019.  She will have a PICC placed on 10/24/2019.  The CBC and chemistry panel are unremarkable today.  I encouraged her to increase her intake of liquids and solids.  She will use Lomotil for intermittent diarrhea.  She is scheduled for an office visit on 10/24/2019.  We encouraged her to register for the COVID-19 vaccine.  Betsy Coder, MD  10/18/2019  1:04 PM

## 2019-10-19 ENCOUNTER — Other Ambulatory Visit: Payer: Self-pay

## 2019-10-19 ENCOUNTER — Ambulatory Visit
Admission: RE | Admit: 2019-10-19 | Discharge: 2019-10-19 | Disposition: A | Discharge status: Home / Self Care | Payer: Medicare Other | Source: Ambulatory Visit | Attending: Radiation Oncology | Admitting: Radiation Oncology

## 2019-10-19 DIAGNOSIS — Z510 Encounter for antineoplastic radiation therapy: Secondary | ICD-10-CM | POA: Diagnosis not present

## 2019-10-19 DIAGNOSIS — C210 Malignant neoplasm of anus, unspecified: Secondary | ICD-10-CM | POA: Diagnosis not present

## 2019-10-19 NOTE — Progress Notes (Signed)
  Radiation Oncology         [phone] ________________________________  Name: Patricia [name] MRN: [id number]  Date: 09/20/2019  DOB: 11-Mar-1941  SIMULATION AND TREATMENT PLANNING NOTE   DIAGNOSIS:     ICD-10-CM   1. Anal cancer ([place])  C21.0      CONSENT VERIFIED: yes   SET UP: Patient is set-up supine   IMMOBILIZATION: The following immobilization is used: Customized VAC lock bag. This complex treatment device will be used on a daily basis during the patient's treatment.   Diagnosis: Anal cancer   NARRATIVE: The patient was brought to the [facility]. Identity was confirmed. All relevant records and images related to the planned course of therapy were reviewed. Then, the patient was positioned in a stable reproducible clinical set-up for radiation therapy using a customized vac lock bag. Skin markings were placed. The CT images were loaded into the planning software where the target and avoidance structures were contoured.The radiation prescription was entered and confirmed.   The patient will receive 54 Gy in 30 fractions to the high-dose target region.  Daily image guidance is ordered, and this will be used on a daily basis. This is necessary to ensure accurate and precise localization of the target in addition to accurate alignment of the normal tissue structures in this region. This is particularly important given the possible motion of the high-dose target.  Treatment planning then occurred.   I have requested : Intensity Modulated Radiotherapy (IMRT) is medically necessary for this case for the following reason: Dose homogeneity; the target is in close proximity to critical normal structures, including the femoral heads, bladder, and small bowel. IMRT is thus medically to appropriately treat the patient.   Special treatment procedure  The patient will receive chemotherapy during the course of radiation treatment. The patient may experience increased or  overlapping toxicity due to this combined-modality approach and the patient will be monitored for such problems. This may include extra lab  work as necessary. This therefore constitutes a special treatment procedure.     ________________________________  Jodelle Gross, MD, PhD

## 2019-10-20 ENCOUNTER — Ambulatory Visit
Admission: RE | Admit: 2019-10-20 | Discharge: 2019-10-20 | Disposition: A | Discharge status: Home / Self Care | Payer: Medicare Other | Source: Ambulatory Visit | Attending: Radiation Oncology | Admitting: Radiation Oncology

## 2019-10-20 DIAGNOSIS — Z510 Encounter for antineoplastic radiation therapy: Secondary | ICD-10-CM | POA: Diagnosis not present

## 2019-10-20 DIAGNOSIS — C210 Malignant neoplasm of anus, unspecified: Secondary | ICD-10-CM | POA: Diagnosis not present

## 2019-10-21 ENCOUNTER — Other Ambulatory Visit: Payer: Self-pay

## 2019-10-21 ENCOUNTER — Ambulatory Visit
Admission: RE | Admit: 2019-10-21 | Discharge: 2019-10-21 | Disposition: A | Discharge status: Home / Self Care | Payer: Medicare Other | Source: Ambulatory Visit | Attending: Radiation Oncology | Admitting: Radiation Oncology

## 2019-10-21 DIAGNOSIS — Z510 Encounter for antineoplastic radiation therapy: Secondary | ICD-10-CM | POA: Diagnosis not present

## 2019-10-21 DIAGNOSIS — C210 Malignant neoplasm of anus, unspecified: Secondary | ICD-10-CM | POA: Diagnosis not present

## 2019-10-23 ENCOUNTER — Other Ambulatory Visit: Payer: Self-pay | Admitting: Oncology

## 2019-10-24 ENCOUNTER — Other Ambulatory Visit: Payer: Self-pay | Admitting: Oncology

## 2019-10-24 ENCOUNTER — Inpatient Hospital Stay: Payer: Medicare Other

## 2019-10-24 ENCOUNTER — Ambulatory Visit (HOSPITAL_COMMUNITY)
Admission: RE | Admit: 2019-10-24 | Discharge: 2019-10-24 | Disposition: A | Discharge status: Home / Self Care | Payer: Medicare Other | Source: Ambulatory Visit | Attending: Oncology | Admitting: Oncology

## 2019-10-24 ENCOUNTER — Ambulatory Visit
Admission: RE | Admit: 2019-10-24 | Discharge: 2019-10-24 | Disposition: A | Discharge status: Home / Self Care | Payer: Medicare Other | Source: Ambulatory Visit | Attending: Radiation Oncology | Admitting: Radiation Oncology

## 2019-10-24 ENCOUNTER — Encounter: Payer: Self-pay | Admitting: Nurse Practitioner

## 2019-10-24 ENCOUNTER — Inpatient Hospital Stay (HOSPITAL_BASED_OUTPATIENT_CLINIC_OR_DEPARTMENT_OTHER): Payer: Medicare Other | Admitting: Nurse Practitioner

## 2019-10-24 ENCOUNTER — Other Ambulatory Visit: Payer: Self-pay

## 2019-10-24 VITALS — BP 150/71 | HR 88 | Temp 98.5°F | Resp 16 | Ht 62.0 in | Wt 115.9 lb

## 2019-10-24 DIAGNOSIS — Z95828 Presence of other vascular implants and grafts: Secondary | ICD-10-CM

## 2019-10-24 DIAGNOSIS — C210 Malignant neoplasm of anus, unspecified: Secondary | ICD-10-CM

## 2019-10-24 DIAGNOSIS — E041 Nontoxic single thyroid nodule: Secondary | ICD-10-CM

## 2019-10-24 DIAGNOSIS — Z510 Encounter for antineoplastic radiation therapy: Secondary | ICD-10-CM | POA: Diagnosis not present

## 2019-10-24 DIAGNOSIS — F418 Other specified anxiety disorders: Secondary | ICD-10-CM | POA: Diagnosis not present

## 2019-10-24 DIAGNOSIS — J449 Chronic obstructive pulmonary disease, unspecified: Secondary | ICD-10-CM | POA: Diagnosis not present

## 2019-10-24 DIAGNOSIS — D61818 Other pancytopenia: Secondary | ICD-10-CM | POA: Diagnosis not present

## 2019-10-24 DIAGNOSIS — Z5111 Encounter for antineoplastic chemotherapy: Secondary | ICD-10-CM | POA: Diagnosis not present

## 2019-10-24 DIAGNOSIS — E785 Hyperlipidemia, unspecified: Secondary | ICD-10-CM | POA: Diagnosis not present

## 2019-10-24 LAB — CMP (CANCER CENTER ONLY)
ALT: 12 U/L (ref 0–44)
AST: 17 U/L (ref 15–41)
Albumin: 2.8 g/dL — ABNORMAL LOW (ref 3.5–5.0)
Alkaline Phosphatase: 115 U/L (ref 38–126)
Anion gap: 9 (ref 5–15)
BUN: 7 mg/dL — ABNORMAL LOW (ref 8–23)
CO2: 29 mmol/L (ref 22–32)
Calcium: 9.3 mg/dL (ref 8.9–10.3)
Chloride: 103 mmol/L (ref 98–111)
Creatinine: 0.63 mg/dL (ref 0.44–1.00)
GFR, Est AFR Am: >60 mL/min (ref >60)
GFR, Estimated: >60 mL/min (ref >60)
Glucose, Bld: 118 mg/dL — ABNORMAL HIGH (ref 70–99)
Potassium: 4.1 mmol/L (ref 3.5–5.1)
Sodium: 141 mmol/L (ref 135–145)
Total Bilirubin: 0.3 mg/dL (ref 0.3–1.2)
Total Protein: 6.1 g/dL — ABNORMAL LOW (ref 6.5–8.1)

## 2019-10-24 LAB — CBC WITH DIFFERENTIAL (CANCER CENTER ONLY)
Abs Immature Granulocytes: 0.04 10*3/uL (ref 0.00–0.07)
Basophils Absolute: 0 10*3/uL (ref 0.0–0.1)
Basophils Relative: 0 %
Eosinophils Absolute: 0 10*3/uL (ref 0.0–0.5)
Eosinophils Relative: 0 %
HCT: 34.9 % — ABNORMAL LOW (ref 36.0–46.0)
Hemoglobin: 10.8 g/dL — ABNORMAL LOW (ref 12.0–15.0)
Immature Granulocytes: 1 %
Lymphocytes Relative: 4 %
Lymphs Abs: 0.2 10*3/uL — ABNORMAL LOW (ref 0.7–4.0)
MCH: 29.3 pg (ref 26.0–34.0)
MCHC: 30.9 g/dL (ref 30.0–36.0)
MCV: 94.6 fL (ref 80.0–100.0)
Monocytes Absolute: 0.5 10*3/uL (ref 0.1–1.0)
Monocytes Relative: 8 %
Neutro Abs: 5.1 10*3/uL (ref 1.7–7.7)
Neutrophils Relative %: 87 %
Platelet Count: 271 10*3/uL (ref 150–400)
RBC: 3.69 MIL/uL — ABNORMAL LOW (ref 3.87–5.11)
RDW: 16.9 % — ABNORMAL HIGH (ref 11.5–15.5)
WBC Count: 5.9 10*3/uL (ref 4.0–10.5)
nRBC: 0 % (ref 0.0–0.2)

## 2019-10-24 MED ORDER — SODIUM CHLORIDE 0.9% FLUSH
10.0000 mL | INTRAVENOUS | Status: DC | PRN
  Administered 2019-10-24: 10 mL
  Filled 2019-10-24: qty 10

## 2019-10-24 MED ORDER — PROCHLORPERAZINE MALEATE 10 MG PO TABS
10.0000 mg | ORAL_TABLET | Freq: Once | ORAL | Status: AC
  Administered 2019-10-24: 10 mg via ORAL

## 2019-10-24 MED ORDER — SODIUM CHLORIDE 0.9 % IV SOLN
Freq: Once | INTRAVENOUS | Status: DC
  Filled 2019-10-24: qty 250

## 2019-10-24 MED ORDER — LIDOCAINE HCL 1 % IJ SOLN
INTRAMUSCULAR | Status: AC
  Filled 2019-10-24: qty 20

## 2019-10-24 MED ORDER — LIDOCAINE HCL 1 % IJ SOLN
INTRAMUSCULAR | Status: DC | PRN
  Administered 2019-10-24: 5 mL

## 2019-10-24 MED ORDER — PROCHLORPERAZINE MALEATE 10 MG PO TABS
ORAL_TABLET | ORAL | Status: AC
  Filled 2019-10-24: qty 1

## 2019-10-24 MED ORDER — MITOMYCIN CHEMO IV INJECTION 20 MG
7.0000 mg/m2 | Freq: Once | INTRAVENOUS | Status: AC
  Administered 2019-10-24: 11 mg via INTRAVENOUS
  Filled 2019-10-24: qty 22

## 2019-10-24 MED ORDER — SODIUM CHLORIDE 0.9 % IV SOLN
790.0000 mg/m2/d | INTRAVENOUS | Status: DC
  Administered 2019-10-24: 5000 mg via INTRAVENOUS
  Filled 2019-10-24: qty 100

## 2019-10-24 NOTE — Progress Notes (Signed)
  [place] OFFICE PROGRESS NOTE   Diagnosis: Anal cancer  INTERVAL HISTORY:   Patricia [name] returns as scheduled.  She continues radiation.  She had a PICC line placed earlier today in anticipation of cycle two 5-FU/mitomycin-C.  She has intermittent nausea/vomiting, diarrhea.  No mouth sores.  Appetite overall is poor.  She does report adequate fluid intake.  No hand or foot pain or redness.  She takes Percocet as needed for rectal pain.  Objective:  Vital signs in last 24 hours:  Blood pressure (!) 150/71, pulse 88, temperature 98.5 F (36.9 C), temperature source Temporal, resp. rate 16, height 5\' 2"  (1.575 m), weight 115 lb 14.4 oz (52.6 kg), SpO2 96 %.    HEENT: No thrush or ulcers. GI: Abdomen soft and nontender.  No hepatomegaly. Vascular: No leg edema. Skin: Erythema at the perineum/lower gluteal fold with superficial skin breakdown.  Persistent mass at the left anal verge/anal margin.  Palms without erythema.   Lab Results:  Lab Results  Component Value Date   WBC 5.9 10/24/2019   HGB 10.8 (L) 10/24/2019   HCT 34.9 (L) 10/24/2019   MCV 94.6 10/24/2019   PLT 271 10/24/2019   NEUTROABS 5.1 10/24/2019    Imaging:  IR PICC PLACEMENT RIGHT >5 YRS INC IMG GUIDE  Result Date: 10/24/2019 Patricia Mau, NP     10/24/2019  123456 AM Right basilic vein single lumen PICC placed without immediate complications. Length 37 cm tip SVC / right atrial junction. Okay to use. Medication used - 1& lidocaine and subcutaneous tissues. EBL < 2 ml.    Medications: I have reviewed the patient's current medications.  Assessment/Plan: 1. Anal cancer  09/06/2019 CT abdomen/pelvis-complex 5.1 cm x 3.2 cm x 4.1 cm posterior left perirectal fluid collection/abscess.  09/07/2019 flexible sigmoidoscopy by Dr. Sim Boast cm x 5 cm hard fungating anal mass located predominantly at the left anterior-lateral area and extending to the dentate line with minor anal stenosis.  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  Cycle 1 day 1 dose-reduced 5FU/mitomycin and concurrent RT 09/26/19   Cycle two 5-FU/mitomycin-C 10/24/2019 2. Rectal bleedingand painsecondary to #1  3. CAD/CABG 4. Hypertension - BP 91/64 on day 10 chemoRT, instructed to hold losartan and amlodipine as of 10/05/19 5. Hyperlipidemia 6. Carotid disease 7. COPD 8. Anxiety/depression 9. Nausea and diarrhea, secondary to chemoRT starting day 5, imodium partially effective. Started lomotil 10/05/19.  10. Mild pancytopenia secondary to chemo, on day 10 of chemoRT WBC 3.7, Hgb 11.4, PLT 142   Disposition: Patricia [name] appears stable.  She continues radiation.  Plan to proceed with cycle two 5-FU/mitomycin-C today as scheduled.  She will return on 10/28/2019 for pump removal and discontinuation of the PICC line.  We reviewed the CBC and chemistry panel from today.  Labs adequate to proceed as above.  She will return for lab and follow-up on 11/02/2019.  She will contact the office in the interim with any problems.  Plan reviewed with Dr. Benay Spice.    Ned Card ANP/GNP-BC   10/24/2019  10:59 AM

## 2019-10-24 NOTE — Procedures (Addendum)
Right basilic vein single lumen PICC placed without immediate complications. Length 37 cm tip SVC / right atrial junction. Okay to use. Medication used - 1& lidocaine and subcutaneous tissues. EBL < 2 ml.

## 2019-10-24 NOTE — Patient Instructions (Signed)
[facility] Discharge Instructions for Patients Receiving Chemotherapy  Today you received the following chemotherapy agents: mitomycin and fluorouracil.   To help prevent nausea and vomiting after your treatment, we encourage you to take your nausea medication as directed.  If you develop nausea and vomiting that is not controlled by your nausea medication, call the clinic.   BELOW ARE SYMPTOMS THAT SHOULD BE REPORTED IMMEDIATELY:  *FEVER GREATER THAN 100.5 F  *CHILLS WITH OR WITHOUT FEVER  NAUSEA AND VOMITING THAT IS NOT CONTROLLED WITH YOUR NAUSEA MEDICATION  *UNUSUAL SHORTNESS OF BREATH  *UNUSUAL BRUISING OR BLEEDING  TENDERNESS IN MOUTH AND THROAT WITH OR WITHOUT PRESENCE OF ULCERS  *URINARY PROBLEMS  *BOWEL PROBLEMS  UNUSUAL RASH Items with * indicate a potential emergency and should be followed up as soon as possible.  Feel free to call the clinic should you have any questions or concerns. The clinic phone number is [phone].  Please show the [facility] at check-in to the Emergency Department and triage nurse.  Fluorouracil, 5-FU injection What is this medicine? FLUOROURACIL, 5-FU (flure oh YOOR a sil) is a chemotherapy drug. It slows the growth of cancer cells. This medicine is used to treat many types of cancer like breast cancer, colon or rectal cancer, pancreatic cancer, and stomach cancer. This medicine may be used for other purposes; ask your health care provider or pharmacist if you have questions. COMMON BRAND NAME(S): Adrucil What should I tell my health care provider before I take this medicine? They need to know if you have any of these conditions:  blood disorders  dihydropyrimidine dehydrogenase (DPD) deficiency  infection (especially a virus infection such as chickenpox, cold sores, or herpes)  kidney disease  liver disease  malnourished, poor nutrition  recent or ongoing radiation therapy  an unusual or allergic  reaction to fluorouracil, other chemotherapy, other medicines, foods, dyes, or preservatives  pregnant or trying to get pregnant  breast-feeding How should I use this medicine? This drug is given as an infusion or injection into a vein. It is administered in a hospital or clinic by a specially trained health care professional. Talk to your pediatrician regarding the use of this medicine in children. Special care may be needed. Overdosage: If you think you have taken too much of this medicine contact a poison control center or emergency room at once. NOTE: This medicine is only for you. Do not share this medicine with others. What if I miss a dose? It is important not to miss your dose. Call your doctor or health care professional if you are unable to keep an appointment. What may interact with this medicine?  allopurinol  cimetidine  dapsone  digoxin  hydroxyurea  leucovorin  levamisole  medicines for seizures like ethotoin, fosphenytoin, phenytoin  medicines to increase blood counts like filgrastim, pegfilgrastim, sargramostim  medicines that treat or prevent blood clots like warfarin, enoxaparin, and dalteparin  methotrexate  metronidazole  pyrimethamine  some other chemotherapy drugs like busulfan, cisplatin, estramustine, vinblastine  trimethoprim  trimetrexate  vaccines Talk to your doctor or health care professional before taking any of these medicines:  acetaminophen  aspirin  ibuprofen  ketoprofen  naproxen This list may not describe all possible interactions. Give your health care provider a list of all the medicines, herbs, non-prescription drugs, or dietary supplements you use. Also tell them if you smoke, drink alcohol, or use illegal drugs. Some items may interact with your medicine. What should I watch for while  using this medicine? Visit your doctor for checks on your progress. This drug may make you feel generally unwell. This is not  uncommon, as chemotherapy can affect healthy cells as well as cancer cells. Report any side effects. Continue your course of treatment even though you feel ill unless your doctor tells you to stop. In some cases, you may be given additional medicines to help with side effects. Follow all directions for their use. Call your doctor or health care professional for advice if you get a fever, chills or sore throat, or other symptoms of a cold or flu. Do not treat yourself. This drug decreases your body's ability to fight infections. Try to avoid being around people who are sick. This medicine may increase your risk to bruise or bleed. Call your doctor or health care professional if you notice any unusual bleeding. Be careful brushing and flossing your teeth or using a toothpick because you may get an infection or bleed more easily. If you have any dental work done, tell your dentist you are receiving this medicine. Avoid taking products that contain aspirin, acetaminophen, ibuprofen, naproxen, or ketoprofen unless instructed by your doctor. These medicines may hide a fever. Do not become pregnant while taking this medicine. Women should inform their doctor if they wish to become pregnant or think they might be pregnant. There is a potential for serious side effects to an unborn child. Talk to your health care professional or pharmacist for more information. Do not breast-feed an infant while taking this medicine. Men should inform their doctor if they wish to father a child. This medicine may lower sperm counts. Do not treat diarrhea with over the counter products. Contact your doctor if you have diarrhea that lasts more than 2 days or if it is severe and watery. This medicine can make you more sensitive to the sun. Keep out of the sun. If you cannot avoid being in the sun, wear protective clothing and use sunscreen. Do not use sun lamps or tanning beds/booths. What side effects may I notice from receiving this  medicine? Side effects that you should report to your doctor or health care professional as soon as possible:  allergic reactions like skin rash, itching or hives, swelling of the face, lips, or tongue  low blood counts - this medicine may decrease the number of white blood cells, red blood cells and platelets. You may be at increased risk for infections and bleeding.  signs of infection - fever or chills, cough, sore throat, pain or difficulty passing urine  signs of decreased platelets or bleeding - bruising, pinpoint red spots on the skin, black, tarry stools, blood in the urine  signs of decreased red blood cells - unusually weak or tired, fainting spells, lightheadedness  breathing problems  changes in vision  chest pain  mouth sores  nausea and vomiting  pain, swelling, redness at site where injected  pain, tingling, numbness in the hands or feet  redness, swelling, or sores on hands or feet  stomach pain  unusual bleeding Side effects that usually do not require medical attention (report to your doctor or health care professional if they continue or are bothersome):  changes in finger or toe nails  diarrhea  dry or itchy skin  hair loss  headache  loss of appetite  sensitivity of eyes to the light  stomach upset  unusually teary eyes This list may not describe all possible side effects. Call your doctor for medical advice about side effects.  You may report side effects to FDA at 1-800-FDA-1088. Where should I keep my medicine? This drug is given in a hospital or clinic and will not be stored at home. NOTE: This sheet is a summary. It may not cover all possible information. If you have questions about this medicine, talk to your doctor, pharmacist, or health care provider.  2020 Elsevier/Gold Standard (2007-12-01 13:53:16)  Mitomycin injection What is this medicine? MITOMYCIN (mye toe MYE sin) is a chemotherapy drug. This medicine is used to treat  cancer of the stomach and pancreas. This medicine may be used for other purposes; ask your health care provider or pharmacist if you have questions. COMMON BRAND NAME(S): Mutamycin What should I tell my health care provider before I take this medicine? They need to know if you have any of these conditions:  bleeding disorders  infection (especially a viral infection such as chickenpox, cold sores, or herpes)  low blood counts, like white cells, platelets, or red blood cells  kidney disease  an unusual or allergic reaction to mitomycin, other medicines, foods, dyes, or preservatives  pregnant or trying to get pregnant  breast-feeding How should I use this medicine? This drug is given as an injection or infusion into a vein. It is administered in a hospital or clinic by a specially trained health care professional. Talk to your pediatrician regarding the use of this medicine in children. Special care may be needed. Overdosage: If you think you have taken too much of this medicine contact a poison control center or emergency room at once. NOTE: This medicine is only for you. Do not share this medicine with others. What if I miss a dose? It is important not to miss your dose. Call your doctor or health care professional if you are unable to keep an appointment. What may interact with this medicine? Interactions are not expected. This list may not describe all possible interactions. Give your health care provider a list of all the medicines, herbs, non-prescription drugs, or dietary supplements you use. Also tell them if you smoke, drink alcohol, or use illegal drugs. Some items may interact with your medicine. What should I watch for while using this medicine? Your condition will be monitored carefully while you are receiving this medicine. You will need important blood work done while you are taking this medicine. This drug may make you feel generally unwell. This is not uncommon, as  chemotherapy can affect healthy cells as well as cancer cells. Report any side effects. Continue your course of treatment even though you feel ill unless your doctor tells you to stop. Call your doctor or health care professional for advice if you get a fever, chills or sore throat, or other symptoms of a cold or flu. Do not treat yourself. This drug decreases your body's ability to fight infections. Try to avoid being around people who are sick. This medicine may increase your risk to bruise or bleed. Call your doctor or health care professional if you notice any unusual bleeding. Be careful brushing and flossing your teeth or using a toothpick because you may get an infection or bleed more easily. If you have any dental work done, tell your dentist you are receiving this medicine. Avoid taking products that contain aspirin, acetaminophen, ibuprofen, naproxen, or ketoprofen unless instructed by your doctor. These medicines may hide a fever. Do not become pregnant while taking this medicine. Women should inform their doctor if they wish to become pregnant or think they might be pregnant.  There is a potential for serious side effects to an unborn child. Talk to your health care professional or pharmacist for more information. Do not breast-feed an infant while taking this medicine. What side effects may I notice from receiving this medicine? Side effects that you should report to your doctor or health care professional as soon as possible:  allergic reactions like skin rash, itching or hives, swelling of the face, lips, or tongue  breathing problems  pain, redness, or irritation at site where injected  signs and symptoms of bleeding such as bloody or black, tarry stools; red or dark brown urine; spitting up blood or brown material that looks like coffee grounds; red spots on the skin; unusual bruising or bleeding from the eyes, gums, or nose  signs and symptoms of infection like fever; chills; cough;  sore throat; pain or trouble passing urine  signs and symptoms of kidney injury like trouble passing urine or change in the amount of urine  signs and symptoms of low red blood cells or anemia such as unusually weak or tired; feeling faint or lightheaded; falls; breathing problems Side effects that usually do not require medical attention (report to your doctor or health care professional if they continue or are bothersome):  green to blue color of urine  hair loss  loss of appetite  mouth sores  nausea, vomiting This list may not describe all possible side effects. Call your doctor for medical advice about side effects. You may report side effects to FDA at 1-800-FDA-1088. Where should I keep my medicine? This drug is given in a hospital or clinic and will not be stored at home. NOTE: This sheet is a summary. It may not cover all possible information. If you have questions about this medicine, talk to your doctor, pharmacist, or health care provider.  2020 Elsevier/Gold Standard (2019-03-15 16:33:50)

## 2019-10-25 ENCOUNTER — Other Ambulatory Visit: Payer: Self-pay

## 2019-10-25 ENCOUNTER — Ambulatory Visit
Admission: RE | Admit: 2019-10-25 | Discharge: 2019-10-25 | Disposition: A | Discharge status: Home / Self Care | Payer: Medicare Other | Source: Ambulatory Visit | Attending: Radiation Oncology | Admitting: Radiation Oncology

## 2019-10-25 DIAGNOSIS — Z510 Encounter for antineoplastic radiation therapy: Secondary | ICD-10-CM | POA: Diagnosis not present

## 2019-10-25 DIAGNOSIS — C210 Malignant neoplasm of anus, unspecified: Secondary | ICD-10-CM | POA: Diagnosis not present

## 2019-10-26 ENCOUNTER — Ambulatory Visit
Admission: RE | Admit: 2019-10-26 | Discharge: 2019-10-26 | Disposition: A | Discharge status: Home / Self Care | Payer: Medicare Other | Source: Ambulatory Visit | Attending: Radiation Oncology | Admitting: Radiation Oncology

## 2019-10-26 ENCOUNTER — Telehealth: Payer: Self-pay | Admitting: Oncology

## 2019-10-26 DIAGNOSIS — C210 Malignant neoplasm of anus, unspecified: Secondary | ICD-10-CM | POA: Diagnosis not present

## 2019-10-26 DIAGNOSIS — Z510 Encounter for antineoplastic radiation therapy: Secondary | ICD-10-CM | POA: Diagnosis not present

## 2019-10-26 NOTE — Telephone Encounter (Signed)
Scheduled per los. Called and spoke with patient. Confirmed appt 

## 2019-10-27 ENCOUNTER — Other Ambulatory Visit: Payer: Self-pay

## 2019-10-27 ENCOUNTER — Ambulatory Visit
Admission: RE | Admit: 2019-10-27 | Discharge: 2019-10-27 | Disposition: A | Discharge status: Home / Self Care | Payer: Medicare Other | Source: Ambulatory Visit | Attending: Radiation Oncology | Admitting: Radiation Oncology

## 2019-10-27 DIAGNOSIS — Z510 Encounter for antineoplastic radiation therapy: Secondary | ICD-10-CM | POA: Diagnosis not present

## 2019-10-27 DIAGNOSIS — C210 Malignant neoplasm of anus, unspecified: Secondary | ICD-10-CM | POA: Diagnosis not present

## 2019-10-28 ENCOUNTER — Other Ambulatory Visit: Payer: Self-pay

## 2019-10-28 ENCOUNTER — Inpatient Hospital Stay: Payer: Medicare Other

## 2019-10-28 ENCOUNTER — Ambulatory Visit
Admission: RE | Admit: 2019-10-28 | Discharge: 2019-10-28 | Disposition: A | Discharge status: Home / Self Care | Payer: Medicare Other | Source: Ambulatory Visit | Attending: Radiation Oncology | Admitting: Radiation Oncology

## 2019-10-28 VITALS — BP 140/42 | HR 77 | Temp 98.0°F | Resp 18

## 2019-10-28 DIAGNOSIS — Z5111 Encounter for antineoplastic chemotherapy: Secondary | ICD-10-CM | POA: Diagnosis not present

## 2019-10-28 DIAGNOSIS — Z95828 Presence of other vascular implants and grafts: Secondary | ICD-10-CM

## 2019-10-28 DIAGNOSIS — F418 Other specified anxiety disorders: Secondary | ICD-10-CM | POA: Diagnosis not present

## 2019-10-28 DIAGNOSIS — J449 Chronic obstructive pulmonary disease, unspecified: Secondary | ICD-10-CM | POA: Diagnosis not present

## 2019-10-28 DIAGNOSIS — D61818 Other pancytopenia: Secondary | ICD-10-CM | POA: Diagnosis not present

## 2019-10-28 DIAGNOSIS — C210 Malignant neoplasm of anus, unspecified: Secondary | ICD-10-CM

## 2019-10-28 DIAGNOSIS — Z510 Encounter for antineoplastic radiation therapy: Secondary | ICD-10-CM | POA: Diagnosis not present

## 2019-10-28 DIAGNOSIS — E785 Hyperlipidemia, unspecified: Secondary | ICD-10-CM | POA: Diagnosis not present

## 2019-10-28 MED ORDER — SODIUM CHLORIDE 0.9% FLUSH
10.0000 mL | INTRAVENOUS | Status: DC | PRN
  Administered 2019-10-28: 10 mL
  Filled 2019-10-28: qty 10

## 2019-10-28 NOTE — Progress Notes (Signed)
Patricia [name] presents today for PICC removal per MD orders. Patient in supine position, 37 cm catheter removed intact from right basilic. Patient observed for 30 minutes after procedure without any incident. Patient tolerated procedure well. Verbal and printed copy of after care provided and she verbalized understanding. Patient knows to call with any questions or concerns.

## 2019-10-28 NOTE — Patient Instructions (Signed)
PICC Removal, Adult, Care After This sheet gives you information about how to care for yourself after your procedure. Your health care provider may also give you more specific instructions. If you have problems or questions, contact your health care provider. What can I expect after the procedure? After your procedure, it is common to have:  Tenderness or soreness.  Redness, swelling, or a scab where the PICC was removed (exit site). Follow these instructions at home: For the first 24 hours after the procedure   Keep the bandage (dressing) on the exit site clean and dry. Do not remove the dressing until your health care provider tells you to do so.  Check your arm often for signs and symptoms of an infection. Check for: ? A red streak that spreads away from the dressing. ? Blood or fluid that you can see on the dressing. ? More redness or swelling.  Do not lift anything heavy or do activities that require great effort until your health care provider says it is okay. You should avoid: ? Lifting weights. ? Yard work. ? Any physical activity with repetitive arm movement.  Watch closely for any signs of an air bubble in the vein (air embolism). This is a rare but serious complication. If you have signs of air embolism, call 911 immediately and lie down on your left side to keep the air from moving into the lungs. Signs of an air embolism include: ? Difficulty breathing. ? Chest pain. ? Coughing or wheezing. ? Skin that is pale, blue, cold, or clammy. ? Rapid pulse. ? Rapid breathing. ? Fainting. After 24 Hours have passed:  Remove your dressing as told by your health care provider. Make sure you wash your hands with soap and water before and after you change the dressing. If soap and water are not available, use hand sanitizer.  Return to your normal activities as told by your health care provider.  A small scab may develop over the exit site. Do not pick at the scab.  When  bathing or showering, gently wash the exit site with soap and water. Pat it dry.  Watch for signs of infection, such as: ? Fever or chills. ? Swollen glands under the arm. ? More redness, swelling, or soreness in the arm. ? Blood, fluid, or pus coming from the exit site. ? Warmth or a bad smell at the exit site. ? A red streak spreading away from the exit site. General instructions  Take over-the-counter and prescription medicines only as told by your health care provider. Do not take any new medicines without checking with your health care provider first.  If you were prescribed an antibiotic medicine, apply or take it as told by your health care provider. Do not stop using the antibiotic even if your condition improves.  Keep all follow-up visits as told by your health care provider. This is important. Contact a health care provider if:  You have a fever or chills.  You have soreness, redness, or swelling on your exit site, and it gets worse.  You have swollen glands under your arm.  You have any of the following symptoms at your exit site: ? Blood, fluid, or pus. ? Unusual warmth. ? A bad smell. ? A red streak spreading away from the exit site. Get help right away if:  You have numbness or tingling in your fingers, hand, or arm.  Your arm looks blue and feels cold.  You have signs of an air embolism, such   as: ? Difficulty breathing. ? Chest pain. ? Coughing or wheezing. ? Skin that is pale, blue, cold, or clammy. ? Rapid pulse. ? Rapid breathing. ? Fainting. These symptoms may represent a serious problem that is an emergency. Do not wait to see if the symptoms will go away. Get medical help right away. Call your local emergency services (911 in the [place] Do not drive yourself to the hospital. Summary  After your procedure, it is common to have tenderness or soreness, redness, swelling, or a scab at the exit site.  Keep the dressing over the exit site clean and dry.  Do not remove the dressing until your health care provider tells you to do so.  Do not lift anything heavy or do activities that require great effort until your health care provider says it is okay.  Watch closely for any signs of an air embolism. If you have signs of air embolism, call 911 immediately and lie down on your left side. This information is not intended to replace advice given to you by your health care provider. Make sure you discuss any questions you have with your health care provider. Document Revised: 07/10/2017 Document Reviewed: 09/23/2016 Elsevier Patient Education  2020 [employer] Inc.  

## 2019-10-31 ENCOUNTER — Ambulatory Visit
Admission: RE | Admit: 2019-10-31 | Discharge: 2019-10-31 | Disposition: A | Discharge status: Home / Self Care | Payer: Medicare Other | Source: Ambulatory Visit | Attending: Radiation Oncology | Admitting: Radiation Oncology

## 2019-10-31 ENCOUNTER — Other Ambulatory Visit: Payer: Self-pay

## 2019-10-31 DIAGNOSIS — Z510 Encounter for antineoplastic radiation therapy: Secondary | ICD-10-CM | POA: Diagnosis not present

## 2019-10-31 DIAGNOSIS — C210 Malignant neoplasm of anus, unspecified: Secondary | ICD-10-CM | POA: Diagnosis not present

## 2019-11-01 ENCOUNTER — Ambulatory Visit
Admission: RE | Admit: 2019-11-01 | Discharge: 2019-11-01 | Disposition: A | Discharge status: Home / Self Care | Payer: Medicare Other | Source: Ambulatory Visit | Attending: Radiation Oncology | Admitting: Radiation Oncology

## 2019-11-01 ENCOUNTER — Other Ambulatory Visit: Payer: Self-pay

## 2019-11-01 DIAGNOSIS — Z510 Encounter for antineoplastic radiation therapy: Secondary | ICD-10-CM | POA: Diagnosis not present

## 2019-11-01 DIAGNOSIS — C210 Malignant neoplasm of anus, unspecified: Secondary | ICD-10-CM | POA: Diagnosis not present

## 2019-11-02 ENCOUNTER — Ambulatory Visit
Admission: RE | Admit: 2019-11-02 | Discharge: 2019-11-02 | Disposition: A | Discharge status: Home / Self Care | Payer: Medicare Other | Source: Ambulatory Visit | Attending: Radiation Oncology | Admitting: Radiation Oncology

## 2019-11-02 ENCOUNTER — Other Ambulatory Visit: Payer: Self-pay

## 2019-11-02 DIAGNOSIS — Z510 Encounter for antineoplastic radiation therapy: Secondary | ICD-10-CM | POA: Diagnosis not present

## 2019-11-02 DIAGNOSIS — C210 Malignant neoplasm of anus, unspecified: Secondary | ICD-10-CM | POA: Diagnosis not present

## 2019-11-03 ENCOUNTER — Encounter: Payer: Self-pay | Admitting: Nurse Practitioner

## 2019-11-03 ENCOUNTER — Other Ambulatory Visit: Payer: Self-pay | Admitting: Nurse Practitioner

## 2019-11-03 ENCOUNTER — Other Ambulatory Visit: Payer: Self-pay

## 2019-11-03 ENCOUNTER — Inpatient Hospital Stay (HOSPITAL_BASED_OUTPATIENT_CLINIC_OR_DEPARTMENT_OTHER): Payer: Medicare Other | Admitting: Nurse Practitioner

## 2019-11-03 ENCOUNTER — Inpatient Hospital Stay: Payer: Medicare Other

## 2019-11-03 ENCOUNTER — Ambulatory Visit
Admission: RE | Admit: 2019-11-03 | Discharge: 2019-11-03 | Disposition: A | Discharge status: Home / Self Care | Payer: Medicare Other | Source: Ambulatory Visit | Attending: Radiation Oncology | Admitting: Radiation Oncology

## 2019-11-03 VITALS — BP 130/53 | HR 89 | Temp 98.7°F | Resp 16 | Ht 62.0 in | Wt 116.2 lb

## 2019-11-03 DIAGNOSIS — Z510 Encounter for antineoplastic radiation therapy: Secondary | ICD-10-CM | POA: Diagnosis not present

## 2019-11-03 DIAGNOSIS — C210 Malignant neoplasm of anus, unspecified: Secondary | ICD-10-CM

## 2019-11-03 DIAGNOSIS — F418 Other specified anxiety disorders: Secondary | ICD-10-CM | POA: Diagnosis not present

## 2019-11-03 DIAGNOSIS — Z5111 Encounter for antineoplastic chemotherapy: Secondary | ICD-10-CM | POA: Diagnosis not present

## 2019-11-03 DIAGNOSIS — J449 Chronic obstructive pulmonary disease, unspecified: Secondary | ICD-10-CM | POA: Diagnosis not present

## 2019-11-03 DIAGNOSIS — E785 Hyperlipidemia, unspecified: Secondary | ICD-10-CM | POA: Diagnosis not present

## 2019-11-03 DIAGNOSIS — D61818 Other pancytopenia: Secondary | ICD-10-CM | POA: Diagnosis not present

## 2019-11-03 LAB — BASIC METABOLIC PANEL - CANCER CENTER ONLY
Anion gap: 12 (ref 5–15)
BUN: <4 mg/dL — ABNORMAL LOW (ref 8–23)
CO2: 27 mmol/L (ref 22–32)
Calcium: 9.1 mg/dL (ref 8.9–10.3)
Chloride: 101 mmol/L (ref 98–111)
Creatinine: 0.59 mg/dL (ref 0.44–1.00)
GFR, Est AFR Am: >60 mL/min (ref >60)
GFR, Estimated: >60 mL/min (ref >60)
Glucose, Bld: 111 mg/dL — ABNORMAL HIGH (ref 70–99)
Potassium: 3.3 mmol/L — ABNORMAL LOW (ref 3.5–5.1)
Sodium: 140 mmol/L (ref 135–145)

## 2019-11-03 LAB — CBC WITH DIFFERENTIAL (CANCER CENTER ONLY)
Abs Immature Granulocytes: 0.01 10*3/uL (ref 0.00–0.07)
Basophils Absolute: 0 10*3/uL (ref 0.0–0.1)
Basophils Relative: 0 %
Eosinophils Absolute: 0 10*3/uL (ref 0.0–0.5)
Eosinophils Relative: 1 %
HCT: 33.3 % — ABNORMAL LOW (ref 36.0–46.0)
Hemoglobin: 10.7 g/dL — ABNORMAL LOW (ref 12.0–15.0)
Immature Granulocytes: 0 %
Lymphocytes Relative: 4 %
Lymphs Abs: 0.1 10*3/uL — ABNORMAL LOW (ref 0.7–4.0)
MCH: 30.6 pg (ref 26.0–34.0)
MCHC: 32.1 g/dL (ref 30.0–36.0)
MCV: 95.1 fL (ref 80.0–100.0)
Monocytes Absolute: 0.3 10*3/uL (ref 0.1–1.0)
Monocytes Relative: 10 %
Neutro Abs: 2.5 10*3/uL (ref 1.7–7.7)
Neutrophils Relative %: 85 %
Platelet Count: 150 10*3/uL (ref 150–400)
RBC: 3.5 MIL/uL — ABNORMAL LOW (ref 3.87–5.11)
RDW: 17.2 % — ABNORMAL HIGH (ref 11.5–15.5)
WBC Count: 2.9 10*3/uL — ABNORMAL LOW (ref 4.0–10.5)
nRBC: 0 % (ref 0.0–0.2)

## 2019-11-03 MED ORDER — POTASSIUM CHLORIDE CRYS ER 20 MEQ PO TBCR: 20.0000 meq | EXTENDED_RELEASE_TABLET | Freq: Every day | ORAL | 0 refills | Status: DC

## 2019-11-03 NOTE — Progress Notes (Signed)
  [facility] OFFICE PROGRESS NOTE   Diagnosis: Anal cancer  INTERVAL HISTORY:   Patricia [name] returns as scheduled.  She completed cycle two 5-FU/mitomycin-C beginning 10/24/2019.  She denies nausea/vomiting.  No mouth sores.  No significant diarrhea.  No rectal bleeding.  Appetite varies.  She reports good fluid intake.  No hand or foot pain or redness.  She is applying a topical agent to the perianal region as recommended by radiation oncology with overall good relief of discomfort.  Objective:  Vital signs in last 24 hours:  Blood pressure (!) 130/53, pulse 89, temperature 98.7 F (37.1 C), temperature source Temporal, resp. rate 16, height 5\' 2"  (1.575 m), weight 116 lb 3.2 oz (52.7 kg), SpO2 97 %.    HEENT: No thrush or ulcers. Lymphatics: Approximate half centimeter left inguinal lymph node. GI: Abdomen soft and nontender.  No hepatomegaly. Vascular: No leg edema.  Skin: Palms without erythema.  Perianal region/perineum with erythema, superficial skin breakdown.  Anal tumor appears significantly smaller.   Lab Results:  Lab Results  Component Value Date   WBC 2.9 (L) 11/03/2019   HGB 10.7 (L) 11/03/2019   HCT 33.3 (L) 11/03/2019   MCV 95.1 11/03/2019   PLT 150 11/03/2019   NEUTROABS 2.5 11/03/2019    Imaging:  No results found.  Medications: I have reviewed the patient's current medications.  Assessment/Plan: 1. Anal cancer  09/06/2019 CT abdomen/pelvis-complex 5.1 cm x 3.2 cm x 4.1 cm posterior left perirectal fluid collection/abscess.  09/07/2019 flexible sigmoidoscopy by Dr. Sim Boast cm x 5 cm hard fungating anal mass located predominantly at the left anterior-lateral area and extending to the dentate line with minor anal stenosis. 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  Cycle 1 day 1 dose-reduced 5FU/mitomycin and concurrent RT 09/26/19   Cycle two 5-FU/mitomycin-C 10/24/2019 2. Rectal bleedingand  painsecondary to #1  3. CAD/CABG 4. Hypertension - BP 91/64 on day 10 chemoRT, instructed to hold losartan and amlodipine as of 10/05/19 5. Hyperlipidemia 6. Carotid disease 7. COPD 8. Anxiety/depression 9. Nausea and diarrhea, secondary to chemoRT starting day 5, imodium partially effective. Started lomotil 10/05/19.  10. Mild pancytopenia secondary to chemo, on day 10 of chemoRT WBC 3.7, Hgb 11.4, PLT 142   Disposition: Patricia [name] appears stable.  She completed cycle two 5-FU/mitomycin-C beginning 10/24/2019.  She continues radiation.  We reviewed the CBC from today.  She understands to contact the office with fever, chills, other signs of infection, bleeding.  She will complete the course of radiation on 11/10/2019.  She will return for lab and follow-up in approximately 1 month.   Ned Card ANP/GNP-BC   11/03/2019  11:58 AM

## 2019-11-04 ENCOUNTER — Ambulatory Visit: Payer: Medicare Other

## 2019-11-04 ENCOUNTER — Telehealth: Payer: Self-pay | Admitting: Oncology

## 2019-11-04 ENCOUNTER — Telehealth: Payer: Self-pay

## 2019-11-04 NOTE — Telephone Encounter (Signed)
Called and left patient a voicemail letting her know instructions below. Instructed patient to call the office with any questions or concerns.

## 2019-11-04 NOTE — Telephone Encounter (Signed)
-----   Message from Owens Shark, NP sent at 11/03/2019  1:30 PM EDT ----- Please let her know potassium is a little low. I am sending a prescription to her pharmacy for K-Dur 20 mEq daily.

## 2019-11-04 NOTE — Telephone Encounter (Signed)
Scheduled per los. Called and left msg. Mailed printout  °

## 2019-11-07 ENCOUNTER — Ambulatory Visit: Payer: Medicare Other

## 2019-11-07 ENCOUNTER — Ambulatory Visit
Admission: RE | Admit: 2019-11-07 | Discharge: 2019-11-07 | Disposition: A | Discharge status: Home / Self Care | Payer: Medicare Other | Source: Ambulatory Visit | Attending: Radiation Oncology | Admitting: Radiation Oncology

## 2019-11-07 ENCOUNTER — Other Ambulatory Visit: Payer: Self-pay

## 2019-11-07 DIAGNOSIS — C210 Malignant neoplasm of anus, unspecified: Secondary | ICD-10-CM | POA: Diagnosis not present

## 2019-11-07 DIAGNOSIS — Z510 Encounter for antineoplastic radiation therapy: Secondary | ICD-10-CM | POA: Diagnosis not present

## 2019-11-08 ENCOUNTER — Ambulatory Visit: Payer: Medicare Other

## 2019-11-08 ENCOUNTER — Ambulatory Visit
Admission: RE | Admit: 2019-11-08 | Discharge: 2019-11-08 | Disposition: A | Discharge status: Home / Self Care | Payer: Medicare Other | Source: Ambulatory Visit | Attending: Radiation Oncology | Admitting: Radiation Oncology

## 2019-11-08 ENCOUNTER — Other Ambulatory Visit: Payer: Self-pay

## 2019-11-08 DIAGNOSIS — C210 Malignant neoplasm of anus, unspecified: Secondary | ICD-10-CM | POA: Diagnosis not present

## 2019-11-08 DIAGNOSIS — Z510 Encounter for antineoplastic radiation therapy: Secondary | ICD-10-CM | POA: Diagnosis not present

## 2019-11-09 ENCOUNTER — Ambulatory Visit: Payer: Medicare Other

## 2019-11-09 ENCOUNTER — Other Ambulatory Visit: Payer: Self-pay

## 2019-11-09 ENCOUNTER — Ambulatory Visit
Admission: RE | Admit: 2019-11-09 | Discharge: 2019-11-09 | Disposition: A | Discharge status: Home / Self Care | Payer: Medicare Other | Source: Ambulatory Visit | Attending: Radiation Oncology | Admitting: Radiation Oncology

## 2019-11-09 DIAGNOSIS — Z510 Encounter for antineoplastic radiation therapy: Secondary | ICD-10-CM | POA: Diagnosis not present

## 2019-11-09 DIAGNOSIS — C210 Malignant neoplasm of anus, unspecified: Secondary | ICD-10-CM | POA: Diagnosis not present

## 2019-11-10 ENCOUNTER — Other Ambulatory Visit: Payer: Self-pay

## 2019-11-10 ENCOUNTER — Ambulatory Visit: Payer: Medicare Other

## 2019-11-10 ENCOUNTER — Ambulatory Visit
Admission: RE | Admit: 2019-11-10 | Discharge: 2019-11-10 | Disposition: A | Discharge status: Home / Self Care | Payer: Medicare Other | Source: Ambulatory Visit | Attending: Radiation Oncology | Admitting: Radiation Oncology

## 2019-11-10 DIAGNOSIS — C210 Malignant neoplasm of anus, unspecified: Secondary | ICD-10-CM | POA: Diagnosis not present

## 2019-11-10 DIAGNOSIS — Z510 Encounter for antineoplastic radiation therapy: Secondary | ICD-10-CM | POA: Insufficient documentation

## 2019-11-11 ENCOUNTER — Encounter: Payer: Self-pay | Admitting: Radiation Oncology

## 2019-11-11 ENCOUNTER — Other Ambulatory Visit: Payer: Self-pay

## 2019-11-11 ENCOUNTER — Ambulatory Visit
Admission: RE | Admit: 2019-11-11 | Discharge: 2019-11-11 | Disposition: A | Discharge status: Home / Self Care | Payer: Medicare Other | Source: Ambulatory Visit | Attending: Radiation Oncology | Admitting: Radiation Oncology

## 2019-11-11 DIAGNOSIS — Z510 Encounter for antineoplastic radiation therapy: Secondary | ICD-10-CM | POA: Diagnosis not present

## 2019-11-11 DIAGNOSIS — C210 Malignant neoplasm of anus, unspecified: Secondary | ICD-10-CM | POA: Diagnosis not present

## 2019-11-14 ENCOUNTER — Other Ambulatory Visit: Payer: Self-pay | Admitting: Nurse Practitioner

## 2019-11-14 DIAGNOSIS — C210 Malignant neoplasm of anus, unspecified: Secondary | ICD-10-CM

## 2019-11-14 MED ORDER — OXYCODONE-ACETAMINOPHEN 5-325 MG PO TABS: 1.0000 | ORAL_TABLET | Freq: Four times a day (QID) | ORAL | 0 refills | Status: DC | PRN

## 2019-12-02 ENCOUNTER — Telehealth: Payer: Self-pay

## 2019-12-02 NOTE — Telephone Encounter (Signed)
Call placed to pt as requested informing her of [facility] visitation policies at this time. Pt verbalizes understanding and agrees with plan of care.

## 2019-12-05 ENCOUNTER — Telehealth: Payer: Self-pay | Admitting: Radiation Oncology

## 2019-12-05 NOTE — Telephone Encounter (Signed)
  Radiation Oncology         [phone] ________________________________  Name: Patricia [name] MRN: [id number]  Date of Service: 12/05/2019  DOB: 1940/09/12  Post Treatment Telephone Note  Diagnosis:  Squamous cell carcinoma of the anus.  Interval Since Last Radiation:  4 weeks   09/26/19-11/11/19: The anus and regional nodes of the pelvis were treated to 54 Gy in 30 fractions.   Narrative:  The patient was contacted today for routine follow-up. During treatment she did very well with radiotherapy but did have anticipated desquamation.   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 We discussed that we would be happy to continue to follow her as needed, but she will also continue to follow up with Dr. Learta Codding in medical oncology.     Carola Rhine, PAC

## 2019-12-06 ENCOUNTER — Inpatient Hospital Stay: Payer: Medicare Other

## 2019-12-06 ENCOUNTER — Other Ambulatory Visit: Payer: Self-pay

## 2019-12-06 ENCOUNTER — Inpatient Hospital Stay: Payer: Medicare Other | Attending: Nurse Practitioner | Admitting: Oncology

## 2019-12-06 VITALS — BP 92/46 | HR 96 | Temp 98.5°F | Resp 18 | Ht 62.0 in | Wt 106.9 lb

## 2019-12-06 DIAGNOSIS — E785 Hyperlipidemia, unspecified: Secondary | ICD-10-CM | POA: Diagnosis not present

## 2019-12-06 DIAGNOSIS — Z923 Personal history of irradiation: Secondary | ICD-10-CM | POA: Diagnosis not present

## 2019-12-06 DIAGNOSIS — D61818 Other pancytopenia: Secondary | ICD-10-CM | POA: Diagnosis not present

## 2019-12-06 DIAGNOSIS — C210 Malignant neoplasm of anus, unspecified: Secondary | ICD-10-CM

## 2019-12-06 DIAGNOSIS — J449 Chronic obstructive pulmonary disease, unspecified: Secondary | ICD-10-CM | POA: Insufficient documentation

## 2019-12-06 DIAGNOSIS — I10 Essential (primary) hypertension: Secondary | ICD-10-CM | POA: Insufficient documentation

## 2019-12-06 DIAGNOSIS — F418 Other specified anxiety disorders: Secondary | ICD-10-CM | POA: Insufficient documentation

## 2019-12-06 DIAGNOSIS — I2510 Atherosclerotic heart disease of native coronary artery without angina pectoris: Secondary | ICD-10-CM | POA: Diagnosis not present

## 2019-12-06 DIAGNOSIS — R197 Diarrhea, unspecified: Secondary | ICD-10-CM | POA: Insufficient documentation

## 2019-12-06 LAB — BASIC METABOLIC PANEL - CANCER CENTER ONLY
Anion gap: 11 (ref 5–15)
BUN: 8 mg/dL (ref 8–23)
CO2: 27 mmol/L (ref 22–32)
Calcium: 9.2 mg/dL (ref 8.9–10.3)
Chloride: 104 mmol/L (ref 98–111)
Creatinine: 0.78 mg/dL (ref 0.44–1.00)
GFR, Est AFR Am: >60 mL/min (ref >60)
GFR, Estimated: >60 mL/min (ref >60)
Glucose, Bld: 132 mg/dL — ABNORMAL HIGH (ref 70–99)
Potassium: 3 mmol/L — CL (ref 3.5–5.1)
Sodium: 142 mmol/L (ref 135–145)

## 2019-12-06 LAB — CBC WITH DIFFERENTIAL (CANCER CENTER ONLY)
Abs Immature Granulocytes: 0.03 10*3/uL (ref 0.00–0.07)
Basophils Absolute: 0 10*3/uL (ref 0.0–0.1)
Basophils Relative: 1 %
Eosinophils Absolute: 0 10*3/uL (ref 0.0–0.5)
Eosinophils Relative: 0 %
HCT: 41.4 % (ref 36.0–46.0)
Hemoglobin: 12.6 g/dL (ref 12.0–15.0)
Immature Granulocytes: 0 %
Lymphocytes Relative: 39 %
Lymphs Abs: 2.9 10*3/uL (ref 0.7–4.0)
MCH: 29.4 pg (ref 26.0–34.0)
MCHC: 30.4 g/dL (ref 30.0–36.0)
MCV: 96.7 fL (ref 80.0–100.0)
Monocytes Absolute: 0.6 10*3/uL (ref 0.1–1.0)
Monocytes Relative: 7 %
Neutro Abs: 4 10*3/uL (ref 1.7–7.7)
Neutrophils Relative %: 53 %
Platelet Count: 202 10*3/uL (ref 150–400)
RBC: 4.28 MIL/uL (ref 3.87–5.11)
RDW: 16.7 % — ABNORMAL HIGH (ref 11.5–15.5)
WBC Count: 7.5 10*3/uL (ref 4.0–10.5)
nRBC: 0 % (ref 0.0–0.2)

## 2019-12-06 NOTE — Progress Notes (Signed)
CRITICAL VALUE STICKER  CRITICAL VALUE: K+ 3.0  RECEIVER (on-site recipient of call): Merceda Elks, RN   DATE & TIME NOTIFIED: 12/06/19 at 1510  MESSENGER (representative from lab):Jen  MD NOTIFIED: Dr. Benay Spice  TIME OF NOTIFICATION: 1615  RESPONSE: seeing patient now. She has not been taking her K+ at home.

## 2019-12-06 NOTE — Patient Instructions (Signed)
For Diarrhea: Take Imodium 2 mg tablets: #2 three times daily as needed up to 6/day                        Take #2 potassium tablets tonight and then #1 twice daily

## 2019-12-06 NOTE — Progress Notes (Signed)
  [place] OFFICE PROGRESS NOTE   Diagnosis: Anal cancer  INTERVAL HISTORY:   Patricia [name] returns as scheduled.  She completed radiation on 11/11/2019.  She reports intermittent diarrhea up to 3-5 times daily.  On some days she has no diarrhea with soft stool.  She is not taking potassium or antidiarrheal medication.  She reports a good appetite.  Objective:  Vital signs in last 24 hours:  Blood pressure (!) 92/46, pulse 96, temperature 98.5 F (36.9 C), temperature source Temporal, resp. rate 18, height 5\' 2"  (1.575 m), weight 106 lb 14.4 oz (48.5 kg), SpO2 96 %.    Resp: Distant breath sounds with coarse rhonchi at the left greater than right lower posterior chest, no respiratory distress Cardio: Regular rate and rhythm with premature beats GI: Soft and nontender, no hepatosplenomegaly, no mass Vascular: No leg edema  Skin: Radiation hyperpigmentation at the groin and perineum Rectal: No anal margin mass, mild erythema/superficial skin breakdown at the anal verge, fullness at the left anal verge and anal canal without a discrete mass.    Lab Results:  Lab Results  Component Value Date   WBC 7.5 12/06/2019   HGB 12.6 12/06/2019   HCT 41.4 12/06/2019   MCV 96.7 12/06/2019   PLT 202 12/06/2019   NEUTROABS 4.0 12/06/2019    CMP  Lab Results  Component Value Date   NA 142 12/06/2019   K 3.0 (LL) 12/06/2019   CL 104 12/06/2019   CO2 27 12/06/2019   GLUCOSE 132 (H) 12/06/2019   BUN 8 12/06/2019   CREATININE 0.78 12/06/2019   CALCIUM 9.2 12/06/2019   PROT 6.1 (L) 10/24/2019   ALBUMIN 2.8 (L) 10/24/2019   AST 17 10/24/2019   ALT 12 10/24/2019   ALKPHOS 115 10/24/2019   BILITOT 0.3 10/24/2019   GFRNONAA >60 12/06/2019   GFRAA >60 12/06/2019     Medications: I have reviewed the patient's current medications.   Assessment/Plan: 1. Anal cancer  09/06/2019 CT abdomen/pelvis-complex 5.1 cm x 3.2 cm x 4.1 cm posterior left perirectal fluid  collection/abscess.  09/07/2019 flexible sigmoidoscopy by Dr. Sim Boast cm x 5 cm hard fungating anal mass located predominantly at the left anterior-lateral area and extending to the dentate line with minor anal stenosis. 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  Cycle 1 day 1 dose-reduced 5FU/mitomycin and concurrent RT 09/26/19   Cycle two 5-FU/mitomycin-C 10/24/2019, radiation completed 11/11/2019 2. Rectal bleedingand painsecondary to #1-improved  3. CAD/CABG 4. Hypertension - BP 91/64 on day 10 chemoRT, instructed to hold losartan and amlodipine as of 10/05/19 5. Hyperlipidemia 6. Carotid disease 7. COPD 8. Anxiety/depression 9. Nausea and diarrhea, secondary to chemoRT starting day 5, imodium partially effective. Started lomotil 10/05/19.  10. Mild pancytopenia secondary to chemo, on day 10 of chemoRT WBC 3.7, Hgb 11.4, PLT 142     Disposition: Patricia [name] has completed treatment for anal cancer.  The anal mass appears significantly smaller.  She continues to have intermittent diarrhea.  She has lost weight and has hypokalemia.  We recommended she hold antihypertensive medications and increase her diet as tolerated.  She will begin Imodium for diarrhea and potassium replacement.  The diarrhea is likely related to the course of chemotherapy and radiation.  I have a low clinical suspicion for a C. difficile infection.  Patricia [name] will return for an office and lab visit in 1 week.  Betsy Coder, MD  12/06/2019  3:30 PM

## 2019-12-07 ENCOUNTER — Telehealth: Payer: Self-pay | Admitting: Nurse Practitioner

## 2019-12-07 NOTE — Telephone Encounter (Signed)
Scheduled per los. Called and left msg  ° °

## 2019-12-08 ENCOUNTER — Telehealth: Payer: Self-pay | Admitting: *Deleted

## 2019-12-08 MED ORDER — CIPROFLOXACIN HCL 500 MG PO TABS: 500.0000 mg | ORAL_TABLET | Freq: Two times a day (BID) | ORAL | 0 refills | Status: DC

## 2019-12-08 NOTE — Telephone Encounter (Addendum)
Thinks she has UTI and wants Dr. Benay Spice to order antibiotic. Having frequency, burning and urine is cloudy w/odor. Per Dr. Benay Spice : Cipro 500 mg bid x 5 days. Script sent to pharmacy and patient notified via VM.

## 2019-12-12 ENCOUNTER — Telehealth: Payer: Self-pay | Admitting: *Deleted

## 2019-12-12 ENCOUNTER — Inpatient Hospital Stay: Payer: Medicare Other

## 2019-12-12 ENCOUNTER — Inpatient Hospital Stay: Payer: Medicare Other | Admitting: Nurse Practitioner

## 2019-12-12 NOTE — Telephone Encounter (Signed)
Called to report she is not feeling well and will not make her appointment today. Patient will call to reschedule. Called patient and she reports getting ready for visit today and she had sudden onset of N/V. Stomach still feels unsettled. Family is getting her nausea med for her. She reports the diarrhea has stopped and she is having bowel movements still. She will call back tomorrow if not better. Scheduling message sent to reschedule for later in the week.

## 2019-12-13 NOTE — Progress Notes (Signed)
  Radiation Oncology         [phone] ________________________________  Name: Patricia [name] MRN: [id number]  Date: 11/11/2019  DOB: [birth year]  End of Treatment Note  Diagnosis: anal cancer     Indication for treatment:  curative       Radiation treatment dates:   09/26/19 - 11/11/19  Site/dose:   The patient was treated with a course of IMRT using a simultaneous integrated boost technique. Daily image guidance was using during the treatment. The high dose region received a total of 54 Gy.  Narrative: The patient tolerated radiation treatment relatively well.   The patient experienced skin irritation as expected by the end of treatment.   Plan: The patient has completed radiation treatment. The patient will return to radiation oncology clinic for routine followup in one month. I advised the patient to call or return sooner if they have any questions or concerns related to their recovery or treatment. ________________________________  Jodelle Gross, M.D., Ph.D.

## 2019-12-15 ENCOUNTER — Telehealth: Payer: Self-pay | Admitting: Oncology

## 2019-12-15 ENCOUNTER — Inpatient Hospital Stay: Payer: Medicare Other | Attending: Nurse Practitioner

## 2019-12-15 ENCOUNTER — Other Ambulatory Visit: Payer: Self-pay

## 2019-12-15 ENCOUNTER — Encounter: Payer: Self-pay | Admitting: Nurse Practitioner

## 2019-12-15 ENCOUNTER — Inpatient Hospital Stay (HOSPITAL_BASED_OUTPATIENT_CLINIC_OR_DEPARTMENT_OTHER): Payer: Medicare Other | Admitting: Nurse Practitioner

## 2019-12-15 VITALS — BP 141/63 | HR 98 | Temp 98.2°F | Resp 20 | Ht 62.0 in | Wt 108.3 lb

## 2019-12-15 DIAGNOSIS — Z9221 Personal history of antineoplastic chemotherapy: Secondary | ICD-10-CM | POA: Insufficient documentation

## 2019-12-15 DIAGNOSIS — E785 Hyperlipidemia, unspecified: Secondary | ICD-10-CM | POA: Insufficient documentation

## 2019-12-15 DIAGNOSIS — Z923 Personal history of irradiation: Secondary | ICD-10-CM | POA: Diagnosis not present

## 2019-12-15 DIAGNOSIS — R110 Nausea: Secondary | ICD-10-CM | POA: Diagnosis not present

## 2019-12-15 DIAGNOSIS — D61818 Other pancytopenia: Secondary | ICD-10-CM | POA: Insufficient documentation

## 2019-12-15 DIAGNOSIS — I2510 Atherosclerotic heart disease of native coronary artery without angina pectoris: Secondary | ICD-10-CM | POA: Diagnosis not present

## 2019-12-15 DIAGNOSIS — C210 Malignant neoplasm of anus, unspecified: Secondary | ICD-10-CM | POA: Insufficient documentation

## 2019-12-15 DIAGNOSIS — J449 Chronic obstructive pulmonary disease, unspecified: Secondary | ICD-10-CM | POA: Diagnosis not present

## 2019-12-15 DIAGNOSIS — F418 Other specified anxiety disorders: Secondary | ICD-10-CM | POA: Insufficient documentation

## 2019-12-15 DIAGNOSIS — I10 Essential (primary) hypertension: Secondary | ICD-10-CM | POA: Insufficient documentation

## 2019-12-15 LAB — BASIC METABOLIC PANEL - CANCER CENTER ONLY
Anion gap: 7 (ref 5–15)
BUN: 9 mg/dL (ref 8–23)
CO2: 29 mmol/L (ref 22–32)
Calcium: 9.3 mg/dL (ref 8.9–10.3)
Chloride: 105 mmol/L (ref 98–111)
Creatinine: 0.64 mg/dL (ref 0.44–1.00)
GFR, Est AFR Am: >60 mL/min (ref >60)
GFR, Estimated: >60 mL/min (ref >60)
Glucose, Bld: 136 mg/dL — ABNORMAL HIGH (ref 70–99)
Potassium: 4 mmol/L (ref 3.5–5.1)
Sodium: 141 mmol/L (ref 135–145)

## 2019-12-15 LAB — MAGNESIUM: Magnesium: 1.8 mg/dL (ref 1.7–2.4)

## 2019-12-15 NOTE — Telephone Encounter (Signed)
Scheduled per 05/06 los, patient will be notified of appointments per my chart.

## 2019-12-15 NOTE — Progress Notes (Signed)
  [place] OFFICE PROGRESS NOTE   Diagnosis: Anal cancer  INTERVAL HISTORY:   Patricia [name] returns for follow-up.  She is feeling much better.  After her last visit she took Imodium 3 times a day for 5 days.  She developed constipation.  Bowels now moving regularly with no diarrhea.  She is no longer taking Imodium.  Appetite is better.  She had mild nausea and abdominal pain earlier this week.  Symptoms have resolved.  Objective:  Vital signs in last 24 hours:  Blood pressure (!) 141/63, pulse 98, temperature 98.2 F (36.8 C), temperature source Temporal, resp. rate 20, height 5\' 2"  (1.575 m), weight 108 lb 4.8 oz (49.1 kg), SpO2 97 %.    HEENT: No thrush or ulcers. Lymphatics: No palpable inguinal lymph nodes. Resp: Breath sounds distant at the lower chest bilaterally left greater than right.  No respiratory distress. Cardio: Regular rate and rhythm. GI: Abdomen soft and nontender.  No hepatosplenomegaly. Vascular: No leg edema. Neuro: Alert and oriented. Rectal: No mass at the anal margin.  No skin breakdown at the perineum/perianal region.   Lab Results:  Lab Results  Component Value Date   WBC 7.5 12/06/2019   HGB 12.6 12/06/2019   HCT 41.4 12/06/2019   MCV 96.7 12/06/2019   PLT 202 12/06/2019   NEUTROABS 4.0 12/06/2019    Imaging:  No results found.  Medications: I have reviewed the patient's current medications.  Assessment/Plan: 1. Anal cancer  09/06/2019 CT abdomen/pelvis-complex 5.1 cm x 3.2 cm x 4.1 cm posterior left perirectal fluid collection/abscess.  09/07/2019 flexible sigmoidoscopy by Dr. Sim Boast cm x 5 cm hard fungating anal mass located predominantly at the left anterior-lateral area and extending to the dentate line with minor anal stenosis. 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  Cycle 1 day 1 dose-reduced 5FU/mitomycin and concurrent RT 09/26/19  Cycle two 5-FU/mitomycin-C 10/24/2019,  radiation completed 11/11/2019 2. Rectal bleedingand painsecondary to #1-improved  3. CAD/CABG 4. Hypertension - BP 91/64 on day 10 chemoRT, instructed to hold losartan and amlodipine as of 10/05/19 5. Hyperlipidemia 6. Carotid disease 7. COPD 8. Anxiety/depression 9. Nausea and diarrhea, secondary to chemoRT starting day 5, imodium partially effective. Started lomotil 10/05/19.  10. Mild pancytopenia secondary to chemo, on day 10 of chemoRT WBC 3.7, Hgb 11.4, PLT 142  Disposition: Patricia [name] appears improved.  She is no longer having diarrhea.  She has gained some weight since her last visit.    Review of today's labs show normal potassium and magnesium.  She will decrease potassium from 20 mEq daily to 10 mEq daily.  We are making a referral to Dr. Marcello Moores for anal surveillance.  She will return for lab and a follow-up visit in 6 weeks.    Ned Card ANP/GNP-BC   12/15/2019  3:27 PM

## 2020-01-02 DIAGNOSIS — Z85048 Personal history of other malignant neoplasm of rectum, rectosigmoid junction, and anus: Secondary | ICD-10-CM | POA: Diagnosis not present

## 2020-01-16 ENCOUNTER — Other Ambulatory Visit: Payer: Self-pay

## 2020-01-16 ENCOUNTER — Inpatient Hospital Stay (HOSPITAL_BASED_OUTPATIENT_CLINIC_OR_DEPARTMENT_OTHER): Payer: Medicare Other | Admitting: Nurse Practitioner

## 2020-01-16 ENCOUNTER — Telehealth: Payer: Self-pay | Admitting: Nurse Practitioner

## 2020-01-16 ENCOUNTER — Encounter: Payer: Self-pay | Admitting: Nurse Practitioner

## 2020-01-16 ENCOUNTER — Inpatient Hospital Stay: Payer: Medicare Other | Attending: Nurse Practitioner

## 2020-01-16 VITALS — BP 132/73 | HR 76 | Temp 98.1°F | Ht 62.0 in | Wt 107.1 lb

## 2020-01-16 DIAGNOSIS — J449 Chronic obstructive pulmonary disease, unspecified: Secondary | ICD-10-CM | POA: Insufficient documentation

## 2020-01-16 DIAGNOSIS — R197 Diarrhea, unspecified: Secondary | ICD-10-CM | POA: Diagnosis not present

## 2020-01-16 DIAGNOSIS — F418 Other specified anxiety disorders: Secondary | ICD-10-CM | POA: Insufficient documentation

## 2020-01-16 DIAGNOSIS — I2510 Atherosclerotic heart disease of native coronary artery without angina pectoris: Secondary | ICD-10-CM | POA: Insufficient documentation

## 2020-01-16 DIAGNOSIS — Z85048 Personal history of other malignant neoplasm of rectum, rectosigmoid junction, and anus: Secondary | ICD-10-CM | POA: Diagnosis not present

## 2020-01-16 DIAGNOSIS — I10 Essential (primary) hypertension: Secondary | ICD-10-CM | POA: Diagnosis not present

## 2020-01-16 DIAGNOSIS — E785 Hyperlipidemia, unspecified: Secondary | ICD-10-CM | POA: Diagnosis not present

## 2020-01-16 DIAGNOSIS — G893 Neoplasm related pain (acute) (chronic): Secondary | ICD-10-CM | POA: Insufficient documentation

## 2020-01-16 DIAGNOSIS — C210 Malignant neoplasm of anus, unspecified: Secondary | ICD-10-CM

## 2020-01-16 DIAGNOSIS — D61818 Other pancytopenia: Secondary | ICD-10-CM | POA: Diagnosis not present

## 2020-01-16 LAB — BASIC METABOLIC PANEL - CANCER CENTER ONLY
Anion gap: 10 (ref 5–15)
BUN: 7 mg/dL — ABNORMAL LOW (ref 8–23)
CO2: 30 mmol/L (ref 22–32)
Calcium: 9.9 mg/dL (ref 8.9–10.3)
Chloride: 103 mmol/L (ref 98–111)
Creatinine: 0.65 mg/dL (ref 0.44–1.00)
GFR, Est AFR Am: >60 mL/min (ref >60)
GFR, Estimated: >60 mL/min (ref >60)
Glucose, Bld: 96 mg/dL (ref 70–99)
Potassium: 3.2 mmol/L — ABNORMAL LOW (ref 3.5–5.1)
Sodium: 143 mmol/L (ref 135–145)

## 2020-01-16 NOTE — Telephone Encounter (Signed)
Scheduled appt per 6/7 los - pt is aware of appts.

## 2020-01-16 NOTE — Progress Notes (Addendum)
  [facility] OFFICE PROGRESS NOTE   Diagnosis: Anal cancer  INTERVAL HISTORY:   Patricia [name] returns as scheduled.  Diarrhea continues to be resolved.  No pain or bleeding with bowel movements.  No nausea or vomiting.  Appetite is stable.  Objective:  Vital signs in last 24 hours:  Blood pressure 132/73, pulse 76, temperature 98.1 F (36.7 C), temperature source Temporal, height 5\' 2"  (1.575 m), weight 107 lb 1.6 oz (48.6 kg), SpO2 95 %.   HEENT: Neck without mass. Lymphatics: No palpable cervical, supraclavicular, axillary or inguinal lymph nodes. Cardio: Regular rate and rhythm. GI: Abdomen soft and nontender.  No hepatomegaly.  Rectal exam deferred due to recent anoscopy. Vascular: No leg edema. Skin: Perineum/perianal region with radiation skin change.   Lab Results:  Lab Results  Component Value Date   WBC 7.5 12/06/2019   HGB 12.6 12/06/2019   HCT 41.4 12/06/2019   MCV 96.7 12/06/2019   PLT 202 12/06/2019   NEUTROABS 4.0 12/06/2019    Imaging:  No results found.  Medications: I have reviewed the patient's current medications.  Assessment/Plan: 1. Anal cancer  09/06/2019 CT abdomen/pelvis-complex 5.1 cm x 3.2 cm x 4.1 cm posterior left perirectal fluid collection/abscess.  09/07/2019 flexible sigmoidoscopy by Dr. Sim Boast cm x 5 cm hard fungating anal mass located predominantly at the left anterior-lateral area and extending to the dentate line with minor anal stenosis. 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  PET scan 09/22/2019-large intensely hypermetabolic exophytic mass extending posteriorly from the anus consistent with primary anal carcinoma.  Local regional hypermetabolic nodal metastasis to a left inguinal lymph node.  No iliac nodal metastasis or para-aortic nodal metastasis.  No liver or other visceral metastasis. Intensely hypermetabolic rounded right supraclavicular mass.  Cycle 1 day 1  dose-reduced 5FU/mitomycin and concurrent RT 09/26/19  Cycle two 5-FU/mitomycin-C 10/24/2019, radiation completed 11/11/2019 2. Rectal bleedingand painsecondary to #1-improved 3. CAD/CABG 4. Hypertension - BP 91/64 on day 10 chemoRT, instructed to hold losartan and amlodipine as of 10/05/19 5. Hyperlipidemia 6. Carotid disease 7. COPD 8. Anxiety/depression 9. Nausea and diarrhea, secondary to chemoRT starting day 5, imodium partially effective. Started lomotil 10/05/19.  10. Mild pancytopenia secondary to chemo, on day 10 of chemoRT WBC 3.7, Hgb 11.4, PLT 142 11.  Hypermetabolic mass right neck on PET scan 09/22/2019.  She declined further evaluation citing a long history of thyroid nodules.   Disposition: Patricia [name] appears stable.  She is in clinical remission from anal cancer.  She will continue anal surveillance with Dr. Marcello Moores.  We again discussed the hypermetabolic right neck mass identified on PET scan February 2021.  She confirmed that she does not wish to proceed with further evaluation.  We reviewed the basic metabolic panel from today.  She has mild hypokalemia.  She will resume K-Dur 10 milliequivalents daily.  She will return for lab and follow-up in 6 months.  Patient seen with Dr. Benay Spice.    Ned Card ANP/GNP-BC   01/16/2020  3:27 PM This was a shared visit with Ned Card.  Patricia [name] was interviewed and examined.  She is in clinical remission from anal cancer.  She is followed by Dr. Marcello Moores for anal surveillance.  She does not wish to undergo further evaluation of the apparent hypermetabolic thyroid lesion.  Julieanne Manson, MD

## 2020-01-23 ENCOUNTER — Ambulatory Visit (INDEPENDENT_AMBULATORY_CARE_PROVIDER_SITE_OTHER): Payer: Medicare Other | Admitting: Ophthalmology

## 2020-01-23 ENCOUNTER — Other Ambulatory Visit: Payer: Self-pay

## 2020-01-23 ENCOUNTER — Encounter (INDEPENDENT_AMBULATORY_CARE_PROVIDER_SITE_OTHER): Payer: Self-pay | Admitting: Ophthalmology

## 2020-01-23 DIAGNOSIS — H34231 Retinal artery branch occlusion, right eye: Secondary | ICD-10-CM | POA: Diagnosis not present

## 2020-01-23 DIAGNOSIS — H353132 Nonexudative age-related macular degeneration, bilateral, intermediate dry stage: Secondary | ICD-10-CM | POA: Insufficient documentation

## 2020-01-23 DIAGNOSIS — H34812 Central retinal vein occlusion, left eye, with macular edema: Secondary | ICD-10-CM

## 2020-01-23 NOTE — Assessment & Plan Note (Signed)
The nature of branch retinal artery occlusion was discussed with the patient as well as its most common etiologies including embolic phenomena from the atherosclerotic plaques and calcific particles emanating from heart valves and/or plaques in the large arteries of neck or inside the brain.  A work-up may include cardiac echo and Doppler and ultrasound studies of the carotid arteries was discussed.  The use of aspirin to reduce platelet clumping was discussed. Avoidance of smoking was discussed.

## 2020-01-23 NOTE — Progress Notes (Signed)
01/23/2020     CHIEF COMPLAINT Patient presents for Retina Follow Up   HISTORY OF PRESENT ILLNESS: Patricia [name] is a 79 y.o. female who presents to the clinic today for:   HPI    Retina Follow Up    Patient presents with  PVD.  In right eye.  Duration of 1 week.  Since onset it is gradually worsening.          Comments    Pt states a week ago she started getting floaters in her right eye- after coughing. Pt states the floaters are gone but now her vision is blurry OD.       Last edited by Gerda Diss on 01/23/2020  3:01 PM. (History)      Referring physician: Colon Branch, MD [address] [place],  [place] [address]  HISTORICAL INFORMATION:   Selected notes from the MEDICAL RECORD NUMBER    Lab Results  Component Value Date   HGBA1C 5.4 07/06/2019     CURRENT MEDICATIONS: No current outpatient medications on file. (Ophthalmic Drugs)   No current facility-administered medications for this visit. (Ophthalmic Drugs)   Current Outpatient Medications (Other)  Medication Sig  . amLODipine (NORVASC) 10 MG tablet Take 1 tablet (10 mg total) by mouth daily.  Marland Kitchen atorvastatin (LIPITOR) 40 MG tablet Take 1 tablet (40 mg total) by mouth daily.  . budesonide-formoterol (SYMBICORT) 160-4.5 MCG/ACT inhaler Inhale 2 puffs into the lungs 2 (two) times daily.  . Cholecalciferol (VITAMIN D3) 1000 UNITS CAPS Take 1,000 Units by mouth daily.  . ciprofloxacin (CIPRO) 500 MG tablet Take 1 tablet (500 mg total) by mouth 2 (two) times daily. X 5 days  . clonazePAM (KLONOPIN) 0.5 MG tablet TAKE 1 TABLET BY MOUTH IN THE MORNING AS NEEDED FOR ANXIETY AND 1 & 1/2 TO 2 (ONE & ONE-HALF TO TWO) AT BEDTIME AS NEEDED FOR SLEEP  . diphenoxylate-atropine (LOMOTIL) 2.5-0.025 MG tablet Take 1-2 tablets by mouth 4 (four) times daily as needed for diarrhea or loose stools. (Patient not taking: Reported on 01/16/2020)  . FLUoxetine (PROZAC) 20 MG capsule Take 2 capsules (40 mg total) by  mouth daily.  Marland Kitchen loperamide (IMODIUM) 2 MG capsule Take 4 mg by mouth 3 (three) times daily as needed for diarrhea or loose stools. Maximum  6/day  . losartan (COZAAR) 100 MG tablet Take 1 tablet by mouth once daily  . ondansetron (ZOFRAN) 8 MG tablet Take 1 tablet (8 mg total) by mouth every 8 (eight) hours as needed for nausea or vomiting. (Patient not taking: Reported on 01/16/2020)  . oxyCODONE-acetaminophen (PERCOCET/ROXICET) 5-325 MG tablet Take 1 tablet by mouth every 6 (six) hours as needed for severe pain. Do not drive while taking. (Patient not taking: Reported on 01/16/2020)  . potassium chloride SA (KLOR-CON) 20 MEQ tablet Take 1 tablet (20 mEq total) by mouth daily. (Patient not taking: Reported on 01/16/2020)  . prochlorperazine (COMPAZINE) 10 MG tablet Take 1 tablet (10 mg total) by mouth every 6 (six) hours as needed. (Patient not taking: Reported on 01/16/2020)   No current facility-administered medications for this visit. (Other)      REVIEW OF SYSTEMS:    ALLERGIES Allergies  Allergen Reactions  . Ace Inhibitors     cough    PAST MEDICAL HISTORY Past Medical History:  Diagnosis Date  . Abnormal LFTs   . Allergic rhinitis   . Anxiety and depression    son commited suicide 2010  .  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 CAD (coronary artery disease)    s/p CABG  . Carotid artery disease ([place])    Moderate - f/u dopplers needed 11/2012  . 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 Zadie Rhine  . COPD (chronic obstructive pulmonary disease) ([place])   . HTN (hypertension)    intolerant to ACEi (cough)  . Hyperlipidemia   . Osteopenia   . PAD (peripheral artery disease) ([place])    Carotid dz as  below, and at [facility] 11/12:  critical stenosis noted just below the sheath site leading into a totally occluded SFA and a patent deep femoral artery  . Panic attacks   . Thyroid nodule    Past Surgical History:  Procedure Laterality Date    . BIOPSY  09/07/2019   Procedure: BIOPSY;  Surgeon: Jackquline Denmark, MD;  Location: [facility];  Service: Gastroenterology;;  . CORONARY ANGIOPLASTY WITH STENT PLACEMENT     "1; makes total of 3" (07/05/2012)  . CORONARY ARTERY BYPASS GRAFT N/A 12/15/2013   Procedure: CORONARY ARTERY BYPASS GRAFTING (CABG);  Surgeon: Gaye Pollack, MD;  Location: [place];  Service: Open Heart Surgery;  Laterality: N/A;  Times 3 using left internal mammary artery and endoscopically harvested right saphenous vein   . FLEXIBLE SIGMOIDOSCOPY N/A 09/07/2019   Procedure: FLEXIBLE SIGMOIDOSCOPY;  Surgeon: Jackquline Denmark, MD;  Location: [facility];  Service: Gastroenterology;  Laterality: N/A;  . INTRAOPERATIVE TRANSESOPHAGEAL ECHOCARDIOGRAM N/A 12/15/2013   Procedure: INTRAOPERATIVE TRANSESOPHAGEAL ECHOCARDIOGRAM;  Surgeon: Gaye Pollack, MD;  Location: [facility] OR;  Service: Open Heart Surgery;  Laterality: N/A;  . 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 LEFT HEART CATHETERIZATION WITH CORONARY ANGIOGRAM N/A 07/02/2011   Procedure: LEFT HEART CATHETERIZATION WITH CORONARY ANGIOGRAM;  Surgeon: Sherren Mocha, MD;  Location: [facility] CATH LAB;  Service: Cardiovascular;  Laterality: N/A;  . LEFT HEART CATHETERIZATION WITH CORONARY ANGIOGRAM N/A 07/05/2012   Procedure: LEFT HEART CATHETERIZATION WITH CORONARY ANGIOGRAM;  Surgeon: Sherren Mocha, MD;  Location[facility] C[facility] LAB;  Service: Cardiovascular;  Laterality: N/A;  . LEFT HEART CATHETERIZATION WITH CORONARY ANGIOGRAM N/A 11/28/2013   Procedure: LEFT HEART CATHETERIZATION WITH CORONARY ANGIOGRAM;  Surgeon: Blane Ohara, MD;  Location: [facility] CATH LAB;  Service: Cardiovascular;  Laterality: N/A;  . PERCUTANEOUS CORONARY INTERVENTION-BALLOON ONLY  07/02/2011   Procedure: PERCUTANEOUS CORONARY INTERVENTION-BALLOON ONLY;  Surgeon: Sherren Mocha, MD;  Location: [facility] CATH LAB;  Service:  Cardiovascular;;  . PERCUTANEOUS CORONARY INTERVENTION-BALLOON ONLY  07/05/2012   Procedure: PERCUTANEOUS CORONARY INTERVENTION-BALLOON ONLY;  Surgeon: Sherren Mocha, MD;  Location: [facility] CATH LAB;  Service: Cardiovascular;;  . VAGINAL HYSTERECTOMY  ~ 1976   no oophorectomy    FAMILY HISTORY Family History  Problem Relation Age of Onset  . Hypertension Mother   . Stroke Mother   . Coronary artery disease Father        CAD in female relative <50  . Heart attack Father   . Heart attack Brother 61  . Heart Problems Sister 45       perminant pacemaker  . Suicidality Son 82       2010  . Healthy Grandchild   . Colon cancer Neg Hx   . Breast cancer Neg Hx   . Stomach cancer Neg Hx   . Rectal cancer Neg Hx   .  Esophageal cancer Neg Hx   . Liver cancer Neg Hx     SOCIAL HISTORY Social History   Tobacco Use  . Smoking status: Former Smoker    Packs/day: 0.50    Years: 54.00    Pack years: 27.00    Types: Cigarettes    Quit date: 12/15/2013    Years since quitting: 6.1  . Smokeless tobacco: Never Used  . Tobacco comment: quit tobacco 12-2013 after CABG  Vaping Use  . Vaping Use: Never used  Substance Use Topics  . Alcohol use: Yes  . Drug use: No         OPHTHALMIC EXAM:  Base Eye Exam    Visual Acuity (Snellen - Linear)      Right Left   Dist cc 20/20 CF @ 2'   Dist ph cc  NI       Tonometry (Tonopen, 3:05 PM)      Right Left   Pressure 11 12       Visual Fields (Counting fingers)      Left Right    Full Full       Dilation    Both eyes: 1.0% Mydriacyl, 2.5% Phenylephrine @ 3:05 PM        Slit Lamp and Fundus Exam    External Exam      Right Left   External Normal Normal       Slit Lamp Exam      Right Left   Lids/Lashes Normal Normal   Conjunctiva/Sclera White and quiet White and quiet   Cornea Clear Clear   Anterior Chamber Deep and quiet Deep and quiet   Iris Round and reactive Round and reactive   Lens Posterior chamber intraocular lens  Posterior chamber intraocular lens   Anterior Vitreous Normal Normal       Fundus Exam      Right Left   Posterior Vitreous Normal    Disc Normal Collaterals on the nerve   C/D Ratio 0.35 0.55   Macula Normal Macular thickening, Cystoid macular edema   Vessels Inferior branch retinal artery occlusion macula Occlusion, with retinal choroidal anastomosis centrally in the macula   Periphery Normal Peripheral PRP          IMAGING AND PROCEDURES  Imaging and Procedures for 01/23/20  OCT, Retina - OU - Both Eyes       Right Eye Quality was good. Scan locations included subfoveal. Central Foveal Thickness: 247. Progression has been stable.   Left Eye Quality was good. Scan locations included subfoveal. Central Foveal Thickness: 385. Progression has been stable. Findings include cystoid macular edema.   Notes Right eye, there is evidence of inner retinal ischemia retinal whitening consistent with inferior branch retinal artery occlusion right eye  Patient will need medical work-up with her physician particularly with attention to carotid Dopplers possibly echocardiogram       Color Fundus Photography Optos - OU - Both Eyes       Right Eye Progression has worsened. Disc findings include normal observations.   Left Eye Progression has been stable. Disc findings include normal observations. Macula : microaneurysms, edema.   Notes OD with branch retinal artery occlusion in the macula inferior to the fovea tunnel whitening  OS, with old central retinal vein occlusion, RPE atrophy in the macula, cystoid macular edema collateralization of the vessels on the nerve VII PRP peripherally                ASSESSMENT/PLAN:  [employer]  retinal artery occlusion, right eye The nature of branch retinal artery occlusion was discussed with the patient as well as its most common etiologies including embolic phenomena from the atherosclerotic plaques and calcific particles emanating from  heart valves and/or plaques in the large arteries of neck or inside the brain.  A work-up may include cardiac echo and Doppler and ultrasound studies of the carotid arteries was discussed.  The use of aspirin to reduce platelet clumping was discussed. Avoidance of smoking was discussed.        ICD-10-CM   1. Branch retinal artery occlusion, right eye  H34.231 OCT, Retina - OU - Both Eyes    Color Fundus Photography Optos - OU - Both Eyes  2. Intermediate stage nonexudative age-related macular degeneration of both eyes  H35.3132   3. 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  Patient should increase her dose of aspirin from 81 mg daily to 325 mg daily  3.  Patient should follow-up with Dr. Belinda Fisher for consideration of carotid Doppler examination and evaluation 4.   We will recommend follow-up also in the left eye and consideration of treatment of the ischemic central retinal vein occlusion with macular edema to stabilize  Ophthalmic Meds Ordered this visit:  No orders of the defined types were placed in this encounter.      Return in about 3 weeks (around 02/13/2020) for OCT, DILATE OU, COLOR FP.  There are no Patient Instructions on file for this visit.   Explained the diagnoses, plan, and follow up with the patient and they expressed understanding.  Patient expressed understanding of the importance of proper follow up care.   Clent Demark Rosselyn Martha M.D. Diseases & Surgery of the Retina and Vitreous Retina & Diabetic [place] 01/23/20     Abbreviations: M myopia (nearsighted); A astigmatism; H hyperopia (farsighted); P presbyopia; Mrx spectacle prescription;  CTL contact lenses;  OD right eye; OS left eye; OU both eyes  XT exotropia; ET esotropia; PEK punctate epithelial keratitis; PEE punctate epithelial erosions; DES dry eye syndrome; MGD meibomian gland dysfunction; ATs artificial tears; PFAT's preservative free artificial tears; [place] nuclear sclerotic cataract; PSC posterior subcapsular cataract; ERM epi-retinal membrane; PVD posterior vitreous detachment; RD retinal detachment; DM diabetes mellitus; DR diabetic retinopathy; NPDR non-proliferative diabetic retinopathy; PDR proliferative diabetic retinopathy; CSME clinically significant macular edema; DME diabetic macular edema; dbh dot blot hemorrhages; CWS cotton wool spot; POAG primary open angle glaucoma; C/D cup-to-disc ratio; HVF humphrey visual field; GVF goldmann visual field; OCT optical coherence tomography; IOP intraocular pressure; BRVO Branch retinal vein occlusion; CRVO central retinal vein occlusion; CRAO central retinal artery occlusion; BRAO branch retinal artery occlusion; RT retinal tear; SB scleral buckle; PPV pars plana vitrectomy; VH Vitreous hemorrhage; PRP panretinal laser photocoagulation; IVK intravitreal kenalog; VMT vitreomacular traction; MH Macular hole;  NVD neovascularization of the disc; NVE neovascularization elsewhere; AREDS age related eye disease study; ARMD age related macular degeneration; POAG primary open angle glaucoma; EBMD epithelial/anterior basement membrane dystrophy; ACIOL anterior chamber intraocular lens; IOL intraocular lens; PCIOL posterior chamber intraocular lens; Phaco/IOL phacoemulsification with intraocular lens placement; [place] photorefractive keratectomy; LASIK laser assisted in situ keratomileusis; HTN hypertension; DM diabetes mellitus; COPD chronic obstructive pulmonary disease

## 2020-01-25 ENCOUNTER — Encounter: Payer: Self-pay | Admitting: Internal Medicine

## 2020-01-25 ENCOUNTER — Ambulatory Visit (INDEPENDENT_AMBULATORY_CARE_PROVIDER_SITE_OTHER): Payer: Medicare Other | Admitting: Internal Medicine

## 2020-01-25 ENCOUNTER — Other Ambulatory Visit: Payer: Self-pay

## 2020-01-25 VITALS — BP 100/63 | HR 88 | Temp 95.5°F | Resp 18 | Ht 62.0 in | Wt 108.5 lb

## 2020-01-25 DIAGNOSIS — H34231 Retinal artery branch occlusion, right eye: Secondary | ICD-10-CM

## 2020-01-25 DIAGNOSIS — I10 Essential (primary) hypertension: Secondary | ICD-10-CM

## 2020-01-25 DIAGNOSIS — E785 Hyperlipidemia, unspecified: Secondary | ICD-10-CM | POA: Diagnosis not present

## 2020-01-25 DIAGNOSIS — I6523 Occlusion and stenosis of bilateral carotid arteries: Secondary | ICD-10-CM | POA: Diagnosis not present

## 2020-01-25 DIAGNOSIS — R739 Hyperglycemia, unspecified: Secondary | ICD-10-CM | POA: Diagnosis not present

## 2020-01-25 LAB — LIPID PANEL
Cholesterol: 149 mg/dL (ref 0–200)
HDL: 51 mg/dL (ref >39.00)
LDL Cholesterol: 76 mg/dL (ref 0–99)
NonHDL: 97.85
Total CHOL/HDL Ratio: 3
Triglycerides: 110 mg/dL (ref 0.0–149.0)
VLDL: 22 mg/dL (ref 0.0–40.0)

## 2020-01-25 LAB — BASIC METABOLIC PANEL
BUN: 7 mg/dL (ref 6–23)
CO2: 31 mEq/L (ref 19–32)
Calcium: 9.4 mg/dL (ref 8.4–10.5)
Chloride: 105 mEq/L (ref 96–112)
Creatinine, Ser: 0.52 mg/dL (ref 0.40–1.20)
GFR: 113.49 mL/min (ref >60.00)
Glucose, Bld: 85 mg/dL (ref 70–99)
Potassium: 3.8 mEq/L (ref 3.5–5.1)
Sodium: 141 mEq/L (ref 135–145)

## 2020-01-25 NOTE — Progress Notes (Signed)
Subjective:    Patient ID: Patricia [name], female    DOB: 1941/04/23, 79 y.o.   MRN: [id number]  DOS:  01/25/2020 Type of visit - description: Acute, here with her niece Jackelyn Poling  Presented to ophthalmology 01/23/2020 with eye problems, floaters at the right field, interestingly they started after a coughing spell. DX eventually was right branch retinal artery occlusion. They recommended further work-up.  Also, since the last visit was diagnosed with anal cancer, fortunately doing well.   BP Readings from Last 3 Encounters:  01/25/20 100/63  01/16/20 132/73  12/15/19 (!) 141/63    Review of Systems Denies chest pain or difficulty breathing No nausea, vomiting, diarrhea No headache, dizziness, diplopia, slurred speech or motor deficit.   Past Medical History:  Diagnosis Date  . Abnormal LFTs   . Allergic rhinitis   . Anxiety and depression    son commited suicide 2010  . 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 CAD (coronary artery disease)    s/p CABG  . Cancer [facility])    anal  . Carotid artery disease ([place])    Moderate - f/u dopplers needed 11/2012  . 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 Zadie Rhine  . COPD (chronic obstructive pulmonary disease) ([place])   . HTN (hypertension)    intolerant to ACEi (cough)  . Hyperlipidemia   . Osteopenia   . PAD (peripheral artery disease) ([place])    Carotid dz as  below, and at [facility] 11/12:  critical stenosis noted just below the sheath site leading into a totally occluded SFA and a patent deep femoral artery  . Panic attacks   . Thyroid nodule     Past Surgical History:  Procedure Laterality Date  . BIOPSY  09/07/2019   Procedure: BIOPSY;  Surgeon: Jackquline Denmark, MD;  Location: [facility];  Service: Gastroenterology;;  . CORONARY ANGIOPLASTY WITH STENT PLACEMENT     "1; makes total of 3" (07/05/2012)  . CORONARY ARTERY BYPASS GRAFT N/A 12/15/2013   Procedure: CORONARY ARTERY BYPASS  GRAFTING (CABG);  Surgeon: Gaye Pollack, MD;  Location: [place];  Service: Open Heart Surgery;  Laterality: N/A;  Times 3 using left internal mammary artery and endoscopically harvested right saphenous vein   . FLEXIBLE SIGMOIDOSCOPY N/A 09/07/2019   Procedure: FLEXIBLE SIGMOIDOSCOPY;  Surgeon: Jackquline Denmark, MD;  Location: [facility];  Service: Gastroenterology;  Laterality: N/A;  . INTRAOPERATIVE TRANSESOPHAGEAL ECHOCARDIOGRAM N/A 12/15/2013   Procedure: INTRAOPERATIVE TRANSESOPHAGEAL ECHOCARDIOGRAM;  Surgeon: Gaye Pollack, MD;  Location: [facility] OR;  Service: Open Heart Surgery;  Laterality: N/A;  . 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 LEFT HEART CATHETERIZATION WITH CORONARY ANGIOGRAM N/A 07/02/2011   Procedure: LEFT HEART CATHETERIZATION WITH CORONARY ANGIOGRAM;  Surgeon: Sherren Mocha, MD;  Location: [facility] CATH LAB;  Service: Cardiovascular;  Laterality: N/A;  . LEFT HEART CATHETERIZATION WITH CORONARY ANGIOGRAM N/A 07/05/2012   Procedure: LEFT HEART CATHETERIZATION WITH CORONARY ANGIOGRAM;  Surgeon: Sherren Mocha, MD;  Location: [facility] CATH LAB;  Service: Cardiovascular;  Laterality: N/A;  . LEFT HEART CATHETERIZATION WITH CORONARY ANGIOGRAM N/A 11/28/2013   Procedure: LEFT HEART CATHETERIZATION WITH CORONARY ANGIOGRAM;  Surgeon: Blane Ohara, MD;  Location: [facility] CATH LAB;  Service: Cardiovascular;  Laterality: N/A;  . PERCUTANEOUS CORONARY INTERVENTION-BALLOON ONLY  07/02/2011   Procedure: PERCUTANEOUS CORONARY  INTERVENTION-BALLOON ONLY;  Surgeon: Sherren Mocha, MD;  Location: [facility] CATH LAB;  Service: Cardiovascular;;  . PERCUTANEOUS CORONARY INTERVENTION-BALLOON ONLY  07/05/2012   Procedure: PERCUTANEOUS CORONARY INTERVENTION-BALLOON ONLY;  Surgeon: Sherren Mocha, MD;  Location: [facility] CATH LAB;  Service: Cardiovascular;;  . VAGINAL HYSTERECTOMY  ~ 1976   no oophorectomy    Allergies as of 01/25/2020       Reactions   Ace Inhibitors    cough      Medication List       Accurate as of January 25, 2020 11:59 PM. If you have any questions, ask your nurse or doctor.        STOP taking these medications   ciprofloxacin 500 MG tablet Commonly known as: Cipro Stopped by: Kathlene November, MD   diphenoxylate-atropine 2.5-0.025 MG tablet Commonly known as: LOMOTIL Stopped by: Kathlene November, MD   loperamide 2 MG capsule Commonly known as: IMODIUM Stopped by: Kathlene November, MD   ondansetron 8 MG tablet Commonly known as: ZOFRAN Stopped by: Kathlene November, MD   prochlorperazine 10 MG tablet Commonly known as: COMPAZINE Stopped by: Kathlene November, MD     TAKE these medications   amLODipine 10 MG tablet Commonly known as: NORVASC Take 1 tablet (10 mg total) by mouth daily.   aspirin 325 MG tablet Take 325 mg by mouth daily.   atorvastatin 40 MG tablet Commonly known as: LIPITOR Take 1 tablet (40 mg total) by mouth daily.   budesonide-formoterol 160-4.5 MCG/ACT inhaler Commonly known as: SYMBICORT Inhale 2 puffs into the lungs 2 (two) times daily.   clonazePAM 0.5 MG tablet Commonly known as: KLONOPIN TAKE 1 TABLET BY MOUTH IN THE MORNING AS NEEDED FOR ANXIETY AND 1 & 1/2 TO 2 (ONE & ONE-HALF TO TWO) AT BEDTIME AS NEEDED FOR SLEEP   FLUoxetine 20 MG capsule Commonly known as: PROZAC Take 2 capsules (40 mg total) by mouth daily.   losartan 100 MG tablet Commonly known as: COZAAR Take 1 tablet by mouth once daily   oxyCODONE-acetaminophen 5-325 MG tablet Commonly known as: PERCOCET/ROXICET Take 1 tablet by mouth every 6 (six) hours as needed for severe pain. Do not drive while taking.   potassium chloride SA 20 MEQ tablet Commonly known as: KLOR-CON Take 1 tablet (20 mEq total) by mouth daily. What changed: how much to take   Vitamin D3 25 MCG (1000 UT) Caps Take 1,000 Units by mouth daily.          Objective:   Physical Exam BP 100/63 (BP Location: Left Arm, Patient Position:  Sitting, Cuff Size: Small)   Pulse 88   Temp (!) 95.5 F (35.3 C) (Temporal)   Resp 18   Ht 5\' 2"  (1.575 m)   Wt 108 lb 8 oz (49.2 kg)   SpO2 93%   BMI 19.84 kg/m  General:   Well developed, NAD, BMI noted. HEENT:  Normocephalic . Face symmetric, atraumatic Neck: Good carotid pulses, she does have bilateral bruits. Lungs:  CTA B Normal respiratory effort, no intercostal retractions, no accessory muscle use. Heart: RRR,  no murmur.  Lower extremities: no pretibial edema bilaterally  Skin: Not pale. Not jaundice Neurologic:  alert & oriented X3.  Speech normal, gait appropriate for age and unassisted.  EOMI, pupils equal reactive, motor symmetric, face symmetric. Psych--  Cognition and judgment appear intact.  Cooperative with normal attention span and concentration.  Behavior appropriate. No anxious or depressed appearing.      Assessment  Assessment   Hyperglycemia, A1c 5.7 HTN Hyperlipidemia Anxiety depression COPD , quit tobacco 2015 DJD CV: --CAD,cath 2012, 2013, CABG 2015 --PAD -   ABIs 2012, 07-2015 : Stable moderate disease left, normal right  --Carotid disease   --Central Retinal vein occlusion 02-2015 --Palpable aorta US abdomen 06-2015 no AA   Increase LFTs, elevated alkaline phosphatase , [place] abd neg 06-2015  Thyroid nodule --[place] 2012 stable (since 2009) Anal cancer:  DX 08-2019, status post chemotherapy.  On clinical remission as of 01/2020   PLAN Right branch retinal artery occlusion: DX few days ago, no other stroke symptoms. Ophthalmology increased aspirin from 81 mg to 325 mg, GI precautions discussed. Neuro exam nonfocal, + carotid bruit. Last carotid ultrasound 04-2018: Right 1 to 39%; Left 40 to 59% Plan: Echo, carotid ultrasound, continue working on risk factor modification.  Cardiology referral Carotid artery disease: See above Hyperglycemia: Check A1c HTN: On losartan.  Has not taking Lasix in a while, recent potassium was low, unclear  why, denies diarrhea or other GI symptoms, was recommended to start KCl 20 mEq 0.5 tablets daily.  Recheck a BMP. BP today slightly low, she is asymptomatic, recommend to check ambulatory BPs.  See AVS Hyperlipidemia, check FLP CAD: Asymptomatic RTC 3 to 4 months  This visit occurred during the SARS-CoV-2 public health emergency.  Safety protocols were in place, including screening questions prior to the visit, additional usage of staff PPE, and extensive cleaning of exam room while observing appropriate contact time as indicated for disinfecting solutions.

## 2020-01-25 NOTE — Patient Instructions (Addendum)
Continue checking your blood pressures, it is a little low today BP GOAL is between 110/65 and  135/85. If it is consistently higher or lower, let me know    GO TO THE LAB : Get the blood work     [place], [place] back for a checkup in 3 to 4 months

## 2020-01-25 NOTE — Progress Notes (Addendum)
I connected with Amila today by telephone and verified that I am speaking with the correct person using two identifiers. Location patient: home Location provider: work Persons participating in the virtual visit: patient, Marine scientist.    I discussed the limitations, risks, security and privacy concerns of performing an evaluation and management service by telephone and the availability of in person appointments. I also discussed with the patient that there may be a patient responsible charge related to this service. The patient expressed understanding and verbally consented to this telephonic visit.    Interactive audio and video telecommunications were attempted between this RN and patient, however failed, due to patient having technical difficulties OR patient did not have access to video capability.  We continued and completed visit with audio only.  Some vital signs may be absent or patient reported.   Subjective:   Patricia [name] is a 79 y.o. female who presents for Medicare Annual (Subsequent) preventive examination.  Review of Systems:  Home Safety/Smoke Alarms: Feels safe in home. Smoke alarms in place.  Lives w/ grandson and his wife. Does okay w/ stairs.  Female:        Mammo- pt declines       Dexa scan- 05/24/18       CCS-07/28/16. No longer doing routine screening due to age.      Objective:     Vitals: Unable to assess. This visit is enabled though telemedicine due to Covid 19.   Advanced Directives 01/26/2020 12/06/2019 09/20/2019 09/06/2019 09/06/2019 08/22/2019 09/04/2017  Does Patient Have a Medical Advance Directive? Yes Yes Yes - Yes No;Yes Yes  Type of Advance Directive [place];Living will [place];Living will [place];Living will [place];Living will - [place];Living will [place];Living will  Does patient want to make changes to medical advance  directive? No - Patient declined No - Patient declined No - Patient declined No - Patient declined No - Patient declined - -  Copy of [place] in Chart? Yes - validated most recent copy scanned in chart (See row information) Yes - validated most recent copy scanned in chart (See row information) - - - - Yes  Pre-existing out of facility DNR order (yellow form or pink MOST form) - - - - - - -    Tobacco Social History   Tobacco Use  Smoking Status Former Smoker  . Packs/day: 0.50  . Years: 54.00  . Pack years: 27.00  . Types: Cigarettes  . Quit date: 12/15/2013  . Years since quitting: 6.1  Smokeless Tobacco Never Used  Tobacco Comment   quit tobacco 12-2013 after CABG     Counseling given: Not Answered Comment: quit tobacco 12-2013 after CABG   Clinical Intake: Pain : No/denies pain      Past Medical History:  Diagnosis Date  . Abnormal LFTs   . Allergic rhinitis   . Anxiety and depression    son commited suicide 2010  . 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 CAD (coronary artery disease)    s/p CABG  . Cancer [facility])    anal  . Carotid artery disease ([place])    Moderate - f/u dopplers needed 11/2012  . 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 Zadie Rhine  . COPD (chronic obstructive pulmonary disease) ([place])   . HTN (hypertension)    intolerant  to ACEi (cough)  . Hyperlipidemia   . Osteopenia   . PAD (peripheral artery disease) ([place])    Carotid dz as  below, and at [facility] 11/12:  critical stenosis noted just below the sheath site leading into a totally occluded SFA and a patent deep femoral artery  . Panic attacks   . Thyroid nodule    Past Surgical History:  Procedure Laterality Date  . BIOPSY  09/07/2019   Procedure: BIOPSY;  Surgeon: Jackquline Denmark, MD;  Location: [facility];  Service: Gastroenterology;;  . CORONARY ANGIOPLASTY WITH STENT PLACEMENT     "1; makes total of 3" (07/05/2012)  . CORONARY  ARTERY BYPASS GRAFT N/A 12/15/2013   Procedure: CORONARY ARTERY BYPASS GRAFTING (CABG);  Surgeon: Gaye Pollack, MD;  Location: [facility];  Service: Open Heart Surgery;  Laterality: N/A;  Times 3 using left internal mammary artery and endoscopically harvested right saphenous vein   . FLEXIBLE SIGMOIDOSCOPY N/A 09/07/2019   Procedure: FLEXIBLE SIGMOIDOSCOPY;  Surgeon: Jackquline Denmark, MD;  Location: [facility];  Service: Gastroenterology;  Laterality: N/A;  . INTRAOPERATIVE TRANSESOPHAGEAL ECHOCARDIOGRAM N/A 12/15/2013   Procedure: INTRAOPERATIVE TRANSESOPHAGEAL ECHOCARDIOGRAM;  Surgeon: Gaye Pollack, MD;  Location: [facility];  Service: Open Heart Surgery;  Laterality: N/A;  . 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 LEFT HEART CATHETERIZATION WITH CORONARY ANGIOGRAM N/A 07/02/2011   Procedure: LEFT HEART CATHETERIZATION WITH CORONARY ANGIOGRAM;  Surgeon: Sherren Mocha, MD;  Location: [facility] CATH LAB;  Service: Cardiovascular;  Laterality: N/A;  . LEFT HEART CATHETERIZATION WITH CORONARY ANGIOGRAM N/A 07/05/2012   Procedure: LEFT HEART CATHETERIZATION WITH CORONARY ANGIOGRAM;  Surgeon: Sherren Mocha, MD;  Location[facility] C[facility] LAB;  Service: Cardiovascular;  Laterality: N/A;  . LEFT HEART CATHETERIZATION WITH CORONARY ANGIOGRAM N/A 11/28/2013   Procedure: LEFT HEART CATHETERIZATION WITH CORONARY ANGIOGRAM;  Surgeon: Blane Ohara, MD;  Location[facility] C[facility] LAB;  Service: Cardiovascular;  Laterality: N/A;  . PERCUTANEOUS CORONARY INTERVENTION-BALLOON ONLY  07/02/2011   Procedure: PERCUTANEOUS CORONARY INTERVENTION-BALLOON ONLY;  Surgeon: Sherren Mocha, MD;  Location: [facility] CATH LAB;  Service: Cardiovascular;;  . PERCUTANEOUS CORONARY INTERVENTION-BALLOON ONLY  07/05/2012   Procedure: PERCUTANEOUS CORONARY INTERVENTION-BALLOON ONLY;  Surgeon: Sherren Mocha, MD;  Location: [facility] CATH LAB;  Service: Cardiovascular;;  . VAGINAL  HYSTERECTOMY  ~ 1976   no oophorectomy   Family History  Problem Relation Age of Onset  . Hypertension Mother   . Stroke Mother   . Coronary artery disease Father        CAD in female relative <50  . Heart attack Father   . Heart attack Brother 59  . Heart Problems Sister 22       perminant pacemaker  . Suicidality Son 63       2010  . Healthy Grandchild   . Colon cancer Neg Hx   . Breast cancer Neg Hx   . Stomach cancer Neg Hx   . Rectal cancer Neg Hx   . Esophageal cancer Neg Hx   . Liver cancer Neg Hx    Social History   Socioeconomic History  . Marital status: Widowed    Spouse name: Not on file  . Number of children: 1  . Years of education: Not on file  . Highest education level: Not on file  Occupational History  . Occupation: Retired-2003  Tobacco Use  . Smoking status: Former Smoker  Packs/day: 0.50    Years: 54.00    Pack years: 27.00    Types: Cigarettes    Quit date: 12/15/2013    Years since quitting: 6.1  . Smokeless tobacco: Never Used  . Tobacco comment: quit tobacco 12-2013 after CABG  Vaping Use  . Vaping Use: Never used  Substance and Sexual Activity  . Alcohol use: Yes  . Drug use: No  . Sexual activity: Not Currently    Birth control/protection: Surgical  Other Topics Concern  . Not on file  Social History Narrative   G-son his wife/son live w/ her    HC-POA -- sister's daughter    Social Determinants of Health   Financial Resource Strain:   . Difficulty of Paying Living Expenses:   Food Insecurity:   . Worried About Charity fundraiser in the Last Year:   . Arboriculturist in the Last Year:   Transportation Needs: No Transportation Needs  . Lack of Transportation (Medical): No  . Lack of Transportation (Non-Medical): No  Physical Activity:   . Days of Exercise per Week:   . Minutes of Exercise per Session:   Stress:   . Feeling of Stress :   Social Connections:   . Frequency of Communication with Friends and Family:   .  Frequency of Social Gatherings with Friends and Family:   . Attends Religious Services:   . Active Member of Clubs or Organizations:   . Attends Archivist Meetings:   Marland Kitchen Marital Status:     Outpatient Encounter Medications as of 01/26/2020  Medication Sig  . amLODipine (NORVASC) 10 MG tablet Take 1 tablet (10 mg total) by mouth daily.  Marland Kitchen aspirin 325 MG tablet Take 325 mg by mouth daily.  Marland Kitchen atorvastatin (LIPITOR) 40 MG tablet Take 1 tablet (40 mg total) by mouth daily.  . budesonide-formoterol (SYMBICORT) 160-4.5 MCG/ACT inhaler Inhale 2 puffs into the lungs 2 (two) times daily.  . Cholecalciferol (VITAMIN D3) 1000 UNITS CAPS Take 1,000 Units by mouth daily.  . clonazePAM (KLONOPIN) 0.5 MG tablet TAKE 1 TABLET BY MOUTH IN THE MORNING AS NEEDED FOR ANXIETY AND 1 & 1/2 TO 2 (ONE & ONE-HALF TO TWO) AT BEDTIME AS NEEDED FOR SLEEP  . FLUoxetine (PROZAC) 20 MG capsule Take 2 capsules (40 mg total) by mouth daily.  Marland Kitchen losartan (COZAAR) 100 MG tablet Take 1 tablet by mouth once daily  . oxyCODONE-acetaminophen (PERCOCET/ROXICET) 5-325 MG tablet Take 1 tablet by mouth every 6 (six) hours as needed for severe pain. Do not drive while taking.  . potassium chloride SA (KLOR-CON) 20 MEQ tablet Take 1 tablet (20 mEq total) by mouth daily. (Patient taking differently: Take 10 mEq by mouth daily. )  . [DISCONTINUED] ciprofloxacin (CIPRO) 500 MG tablet Take 1 tablet (500 mg total) by mouth 2 (two) times daily. X 5 days (Patient not taking: Reported on 01/25/2020)  . [DISCONTINUED] diphenoxylate-atropine (LOMOTIL) 2.5-0.025 MG tablet Take 1-2 tablets by mouth 4 (four) times daily as needed for diarrhea or loose stools. (Patient not taking: Reported on 01/16/2020)  . [DISCONTINUED] loperamide (IMODIUM) 2 MG capsule Take 4 mg by mouth 3 (three) times daily as needed for diarrhea or loose stools. Maximum  6/day (Patient not taking: Reported on 01/25/2020)  . [DISCONTINUED] ondansetron (ZOFRAN) 8 MG tablet Take 1  tablet (8 mg total) by mouth every 8 (eight) hours as needed for nausea or vomiting. (Patient not taking: Reported on 01/16/2020)  . [DISCONTINUED] prochlorperazine (COMPAZINE)  10 MG tablet Take 1 tablet (10 mg total) by mouth every 6 (six) hours as needed. (Patient not taking: Reported on 01/16/2020)   No facility-administered encounter medications on file as of 01/26/2020.    Activities of Daily Living In your present state of health, do you have any difficulty performing the following activities: 01/26/2020  Hearing? N  Vision? N  Difficulty concentrating or making decisions? N  Walking or climbing stairs? N  Dressing or bathing? N  Doing errands, shopping? Y  Comment relatives transport her.  Preparing Food and eating ? N  Using the Toilet? N  In the past six months, have you accidently leaked urine? N  Do you have problems with loss of bowel control? N  Managing your Medications? N  Managing your Finances? N  Housekeeping or managing your Housekeeping? N  Some recent data might be hidden    Patient Care Team: Colon Branch, MD as PCP - General (Internal Medicine) Sherren Mocha, MD as PCP - Cardiology (Cardiology) Rigoberto Noel, MD as Consulting Physician (Pulmonary Disease) Zadie Rhine Clent Demark, MD as Consulting Physician (Ophthalmology) Burtis Junes, NP as Nurse Practitioner (Cardiology) Shantasia Shipper, MD as Consulting Physician (Gastroenterology) Leighton Ruff, MD as Consulting Physician (General Surgery)    Assessment:   This is a routine wellness examination for Rhegan. Physical assessment deferred to PCP.  Exercise Activities and Dietary recommendations Current Exercise Habits: The patient does not participate in regular exercise at present, Exercise limited by: None identified Diet (meal preparation, eat out, water intake, caffeinated beverages, dairy products, fruits and vegetables): 24 hr recall. Education provided. Breakfast: cheese omelet, grits Lunch: snack Dinner:   Little debbie cakes.   Goals    .  Maintain current health (pt-stated)    .  Maintain Lifestyle       Fall Risk Fall Risk  01/26/2020 01/25/2020 07/06/2019 03/11/2019 09/04/2017  Falls in the past year? 0 0 0 (No Data) No  Comment - - - Emmi Telephone Survey: data to providers prior to load -  Number falls in past yr: 0 0 - (No Data) -  Comment - - - Emmi Telephone Survey Actual Response =  -  Injury with Fall? 0 0 - - -  Follow up Education provided;Falls prevention discussed Falls evaluation completed Falls evaluation completed - -   Depression Screen PHQ 2/9 Scores 01/26/2020 01/25/2020 07/06/2019 07/06/2019  PHQ - 2 Score 0 2 2 0  PHQ- 9 Score - 3 3 -     Cognitive Function Ad8 score reviewed for issues:  Issues making decisions:no  Less interest in hobbies / activities:no  Repeats questions, stories (family complaining):no  Trouble using ordinary gadgets (microwave, computer, phone):no  Forgets the month or year: no  Mismanaging finances: no  Remembering appts:no  Daily problems with thinking and/or memory:no Ad8 score is=0     MMSE - Mini Mental State Exam 09/04/2017 08/21/2016  Orientation to time 5 5  Orientation to Place 5 5  Registration 3 3  Attention/ Calculation 5 5  Recall 3 3  Language- name 2 objects 2 2  Language- repeat 1 1  Language- follow 3 step command 3 3  Language- read & follow direction 1 1  Write a sentence 1 1  Copy design 1 1  Total score 30 30        Immunization History  Administered Date(s) Administered  . Fluad Quad(high Dose 65+) 07/06/2019  . Influenza Split 07/24/2011, 04/30/2012  . Influenza Whole 06/14/2007,  06/08/2008, 05/18/2009, 05/09/2010  . Influenza, High Dose Seasonal PF 04/11/2013, 05/13/2016, 06/24/2017, 05/03/2018  . Influenza,inj,Quad PF,6+ Mos 05/09/2014, 05/28/2015  . PFIZER SARS-COV-2 Vaccination 10/29/2019, 11/26/2019  . Pneumococcal Conjugate-13 04/06/2014  . Pneumococcal Polysaccharide-23 08/11/2005,  07/18/2010  . Td 01/25/2007  . Tdap 10/07/2016    Screening Tests Health Maintenance  Topic Date Due  . Hepatitis C Screening  Never done  . INFLUENZA VACCINE  03/11/2020  . TETANUS/TDAP  10/07/2026  . DEXA SCAN  Completed  . COVID-19 Vaccine  Completed  . PNA vac Low Risk Adult  Completed       Plan:    Please schedule your next medicare wellness visit with me in 1 yr.  Continue to eat heart healthy diet (full of fruits, vegetables, whole grains, lean protein, water--limit salt, fat, and sugar intake) and increase physical activity as tolerated.  Continue doing brain stimulating activities (puzzles, reading, adult coloring books, staying active) to keep memory sharp.    I have personally reviewed and noted the following in the patient's chart:   . Medical and social history . Use of alcohol, tobacco or illicit drugs  . Current medications and supplements . Functional ability and status . Nutritional status . Physical activity . Advanced directives . List of other physicians . Hospitalizations, surgeries, and ER visits in previous 12 months . Vitals . Screenings to include cognitive, depression, and falls . Referrals and appointments  In addition, I have reviewed and discussed with patient certain preventive protocols, quality metrics, and best practice recommendations. A written personalized care plan for preventive services as well as general preventive health recommendations were provided to patient.   Due to this being a telephonic visit, the after visit summary with patients personalized plan was offered to patient via mail or my-chart.Patient would like to access on my-chart.   Shela Nevin, [place]  01/26/2020

## 2020-01-25 NOTE — Progress Notes (Signed)
Pre visit review using our clinic review tool, if applicable. No additional management support is needed unless otherwise documented below in the visit note. 

## 2020-01-26 ENCOUNTER — Encounter: Payer: Self-pay | Admitting: *Deleted

## 2020-01-26 ENCOUNTER — Ambulatory Visit (INDEPENDENT_AMBULATORY_CARE_PROVIDER_SITE_OTHER): Payer: Medicare Other | Admitting: *Deleted

## 2020-01-26 DIAGNOSIS — Z0000 Encounter for general adult medical examination without abnormal findings: Secondary | ICD-10-CM | POA: Diagnosis not present

## 2020-01-26 LAB — HEMOGLOBIN A1C: Hgb A1c MFr Bld: 5.7 % (ref 4.6–6.5)

## 2020-01-26 NOTE — Assessment & Plan Note (Signed)
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 Ophthalmology increased aspirin from 81 mg to 325 mg, GI precautions discussed. Neuro exam nonfocal, + carotid bruit. Last carotid ultrasound 04-2018: Right 1 to 39%; Left 40 to 59% Plan: Echo, carotid ultrasound, continue working on risk factor modification.  Cardiology referral Carotid artery disease: See above Hyperglycemia: Check A1c HTN: On losartan.  Has not taking Lasix in a while, recent potassium was low, unclear why, denies diarrhea or other GI symptoms, was recommended to start KCl 20 mEq 0.5 tablets daily.  Recheck a BMP. BP today slightly low, she is asymptomatic, recommend to check ambulatory BPs.  See AVS Hyperlipidemia, check FLP CAD: Asymptomatic RTC 3 to 4 months

## 2020-01-26 NOTE — Patient Instructions (Signed)
Please schedule your next medicare wellness visit with me in 1 yr.  Continue to eat heart healthy diet (full of fruits, vegetables, whole grains, lean protein, water--limit salt, fat, and sugar intake) and increase physical activity as tolerated.  Continue doing brain stimulating activities (puzzles, reading, adult coloring books, staying active) to keep memory sharp.    Patricia [name] , Thank you for taking time to come for your Medicare Wellness Visit. I appreciate your ongoing commitment to your health goals. Please review the following plan we discussed and let me know if I can assist you in the future.   These are the goals we discussed: Goals    .  Maintain current health (pt-stated)    .  Maintain Lifestyle       This is a list of the screening recommended for you and due dates:  Health Maintenance  Topic Date Due  .  Hepatitis C: One time screening is recommended by Center for Disease Control  (CDC) for  adults born from [birth year] through 1966.   Never done  . Flu Shot  03/11/2020  . Tetanus Vaccine  10/07/2026  . DEXA scan (bone density measurement)  Completed  . COVID-19 Vaccine  Completed  . Pneumonia vaccines  Completed    Preventive Care 21 Years and Older, Female Preventive care refers to lifestyle choices and visits with your health care provider that can promote health and wellness. This includes:  A yearly physical exam. This is also called an annual well check.  Regular dental and eye exams.  Immunizations.  Screening for certain conditions.  Healthy lifestyle choices, such as diet and exercise. What can I expect for my preventive care visit? Physical exam Your health care provider will check:  Height and weight. These may be used to calculate body mass index (BMI), which is a measurement that tells if you are at a healthy weight.  Heart rate and blood pressure.  Your skin for abnormal spots. Counseling Your health care provider may ask you questions  about:  Alcohol, tobacco, and drug use.  Emotional well-being.  Home and relationship well-being.  Sexual activity.  Eating habits.  History of falls.  Memory and ability to understand (cognition).  Work and work Statistician.  Pregnancy and menstrual history. What immunizations do I need?  Influenza (flu) vaccine  This is recommended every year. Tetanus, diphtheria, and pertussis (Tdap) vaccine  You may need a Td booster every 10 years. Varicella (chickenpox) vaccine  You may need this vaccine if you have not already been vaccinated. Zoster (shingles) vaccine  You may need this after age 25. Pneumococcal conjugate (PCV13) vaccine  One dose is recommended after age 52. Pneumococcal polysaccharide (PPSV23) vaccine  One dose is recommended after age 20. Measles, mumps, and rubella (MMR) vaccine  You may need at least one dose of MMR if you were born in 1957 or later. You may also need a second dose. Meningococcal conjugate (MenACWY) vaccine  You may need this if you have certain conditions. Hepatitis A vaccine  You may need this if you have certain conditions or if you travel or work in places where you may be exposed to hepatitis A. Hepatitis B vaccine  You may need this if you have certain conditions or if you travel or work in places where you may be exposed to hepatitis B. Haemophilus influenzae type b (Hib) vaccine  You may need this if you have certain conditions. You may receive vaccines as individual doses or as more  than one vaccine together in one shot (combination vaccines). Talk with your health care provider about the risks and benefits of combination vaccines. What tests do I need? Blood tests  Lipid and cholesterol levels. These may be checked every 5 years, or more frequently depending on your overall health.  Hepatitis C test.  Hepatitis B test. Screening  Lung cancer screening. You may have this screening every year starting at age 69 if  you have a 30-pack-year history of smoking and currently smoke or have quit within the past 15 years.  Colorectal cancer screening. All adults should have this screening starting at age 27 and continuing until age 13. Your health care provider may recommend screening at age 43 if you are at increased risk. You will have tests every 1-10 years, depending on your results and the type of screening test.  Diabetes screening. This is done by checking your blood sugar (glucose) after you have not eaten for a while (fasting). You may have this done every 1-3 years.  Mammogram. This may be done every 1-2 years. Talk with your health care provider about how often you should have regular mammograms.  BRCA-related cancer screening. This may be done if you have a family history of breast, ovarian, tubal, or peritoneal cancers. Other tests  Sexually transmitted disease (STD) testing.  Bone density scan. This is done to screen for osteoporosis. You may have this done starting at age 7. Follow these instructions at home: Eating and drinking  Eat a diet that includes fresh fruits and vegetables, whole grains, lean protein, and low-fat dairy products. Limit your intake of foods with high amounts of sugar, saturated fats, and salt.  Take vitamin and mineral supplements as recommended by your health care provider.  Do not drink alcohol if your health care provider tells you not to drink.  If you drink alcohol: ? Limit how much you have to 0-1 drink a day. ? Be aware of how much alcohol is in your drink. In the [place] one drink equals one 12 oz bottle of beer (355 mL), one 5 oz glass of wine (148 mL), or one 1 oz glass of hard liquor (44 mL). Lifestyle  Take daily care of your teeth and gums.  Stay active. Exercise for at least 30 minutes on 5 or more days each week.  Do not use any products that contain nicotine or tobacco, such as cigarettes, e-cigarettes, and chewing tobacco. If you need help  quitting, ask your health care provider.  If you are sexually active, practice safe sex. Use a condom or other form of protection in order to prevent STIs (sexually transmitted infections).  Talk with your health care provider about taking a low-dose aspirin or statin. What's next?  Go to your health care provider once a year for a well check visit.  Ask your health care provider how often you should have your eyes and teeth checked.  Stay up to date on all vaccines. This information is not intended to replace advice given to you by your health care provider. Make sure you discuss any questions you have with your health care provider. Document Revised: 07/22/2018 Document Reviewed: 07/22/2018 Elsevier Patient Education  2020 [employer].

## 2020-01-30 ENCOUNTER — Telehealth: Payer: Self-pay

## 2020-01-30 MED ORDER — ATORVASTATIN CALCIUM 40 MG PO TABS: 80.0000 mg | ORAL_TABLET | Freq: Every day | ORAL | 1 refills | Status: DC

## 2020-01-30 NOTE — Addendum Note (Signed)
Addended byDamita Dunnings D on: 01/30/2020 07:34 AM   Modules accepted: Orders

## 2020-01-30 NOTE — Telephone Encounter (Signed)
Pt's insurance does not cover 2 tablets of atorvastatin 40mg  daily- okay to change to atorvastatin 80mg ?

## 2020-01-30 NOTE — Telephone Encounter (Signed)
Yes, thx

## 2020-01-31 MED ORDER — ATORVASTATIN CALCIUM 80 MG PO TABS
80.0000 mg | ORAL_TABLET | Freq: Every day | ORAL | 1 refills | Status: DC
Start: 2020-01-31 — End: 2020-12-10

## 2020-01-31 NOTE — Telephone Encounter (Signed)
Atorvastatin 80mg  sent to [place].

## 2020-02-06 ENCOUNTER — Other Ambulatory Visit: Payer: Self-pay | Admitting: Internal Medicine

## 2020-02-06 ENCOUNTER — Other Ambulatory Visit: Payer: Self-pay

## 2020-02-06 ENCOUNTER — Ambulatory Visit (HOSPITAL_COMMUNITY)
Admission: RE | Admit: 2020-02-06 | Discharge: 2020-02-06 | Disposition: A | Discharge status: Home / Self Care | Payer: Medicare Other | Source: Ambulatory Visit | Attending: Cardiology | Admitting: Cardiology

## 2020-02-06 DIAGNOSIS — H34231 Retinal artery branch occlusion, right eye: Secondary | ICD-10-CM

## 2020-02-06 DIAGNOSIS — I6523 Occlusion and stenosis of bilateral carotid arteries: Secondary | ICD-10-CM

## 2020-02-14 ENCOUNTER — Encounter (INDEPENDENT_AMBULATORY_CARE_PROVIDER_SITE_OTHER): Payer: Medicare Other | Admitting: Ophthalmology

## 2020-02-20 ENCOUNTER — Encounter (INDEPENDENT_AMBULATORY_CARE_PROVIDER_SITE_OTHER): Payer: Medicare Other | Admitting: Ophthalmology

## 2020-02-20 ENCOUNTER — Telehealth: Payer: Self-pay | Admitting: *Deleted

## 2020-02-20 NOTE — Telephone Encounter (Signed)
Niece called to request referral to surgeon in [facility] for her follow up. Patricia [name] it was just not working out at [employer]. Called patient and confirmed there is not any problem w/the practice, she just needs a surgeon closer to her home in [place]. Will f/u w/Dr. Benay Spice for referral.

## 2020-02-21 NOTE — Telephone Encounter (Signed)
Informed patient that he does not know any surgeons in [facility]. Suggested she call [facility] and speak w/Dr. Manon Hilding nurse and explain the request and ask for them to refer her to one. She agrees to do this.

## 2020-02-23 ENCOUNTER — Ambulatory Visit: Payer: Medicare Other | Admitting: Medical

## 2020-02-27 ENCOUNTER — Other Ambulatory Visit: Payer: Self-pay

## 2020-02-27 ENCOUNTER — Ambulatory Visit (INDEPENDENT_AMBULATORY_CARE_PROVIDER_SITE_OTHER): Payer: Medicare Other | Admitting: Ophthalmology

## 2020-02-27 ENCOUNTER — Encounter (INDEPENDENT_AMBULATORY_CARE_PROVIDER_SITE_OTHER): Payer: Self-pay | Admitting: Ophthalmology

## 2020-02-27 DIAGNOSIS — I6523 Occlusion and stenosis of bilateral carotid arteries: Secondary | ICD-10-CM

## 2020-02-27 DIAGNOSIS — H353132 Nonexudative age-related macular degeneration, bilateral, intermediate dry stage: Secondary | ICD-10-CM | POA: Diagnosis not present

## 2020-02-27 DIAGNOSIS — H34231 Retinal artery branch occlusion, right eye: Secondary | ICD-10-CM | POA: Diagnosis not present

## 2020-02-27 DIAGNOSIS — H34812 Central retinal vein occlusion, left eye, with macular edema: Secondary | ICD-10-CM

## 2020-02-27 NOTE — Assessment & Plan Note (Signed)
Work-up with carotid Dopplers as well as blood work is completed.  No progression of BRAO was noted on examination or on color fundus regular is an OCT

## 2020-02-27 NOTE — Progress Notes (Signed)
02/27/2020     CHIEF COMPLAINT Patient presents for Retina Follow Up   HISTORY OF PRESENT ILLNESS: Patricia [name] is a 79 y.o. female who presents to the clinic today for:   HPI    Retina Follow Up    Patient presents with  CRVO/BRVO.  In both eyes.  Duration of 5 weeks.  Since onset it is stable.          Comments    5 week f.u - OCT OU & FP OU Patient denies change in vision and overall has no complaints.        Last edited by Gerda Diss on 02/27/2020  1:44 PM. (History)      Referring physician: Colon Branch, MD [address] [place],  [place] [address]  HISTORICAL INFORMATION:   Selected notes from the MEDICAL RECORD NUMBER    Lab Results  Component Value Date   HGBA1C 5.7 01/25/2020     CURRENT MEDICATIONS: No current outpatient medications on file. (Ophthalmic Drugs)   No current facility-administered medications for this visit. (Ophthalmic Drugs)   Current Outpatient Medications (Other)  Medication Sig   amLODipine (NORVASC) 10 MG tablet Take 1 tablet (10 mg total) by mouth daily.   aspirin 325 MG tablet Take 325 mg by mouth daily.   atorvastatin (LIPITOR) 80 MG tablet Take 1 tablet (80 mg total) by mouth at bedtime.   budesonide-formoterol (SYMBICORT) 160-4.5 MCG/ACT inhaler Inhale 2 puffs into the lungs 2 (two) times daily.   Cholecalciferol (VITAMIN D3) 1000 UNITS CAPS Take 1,000 Units by mouth daily.   clonazePAM (KLONOPIN) 0.5 MG tablet TAKE 1 TABLET BY MOUTH IN THE MORNING AS NEEDED FOR ANXIETY AND 1 & 1/2 TO 2 (ONE & ONE-HALF TO TWO) AT BEDTIME AS NEEDED FOR SLEEP   FLUoxetine (PROZAC) 20 MG capsule Take 2 capsules (40 mg total) by mouth daily.   losartan (COZAAR) 100 MG tablet Take 1 tablet by mouth once daily   oxyCODONE-acetaminophen (PERCOCET/ROXICET) 5-325 MG tablet Take 1 tablet by mouth every 6 (six) hours as needed for severe pain. Do not drive while taking.   potassium chloride SA (KLOR-CON) 20 MEQ tablet Take 1  tablet (20 mEq total) by mouth daily. (Patient taking differently: Take 10 mEq by mouth daily. )   No current facility-administered medications for this visit. (Other)      REVIEW OF SYSTEMS:    ALLERGIES Allergies  Allergen Reactions   Ace Inhibitors     cough    PAST MEDICAL HISTORY Past Medical History:  Diagnosis Date   Abnormal LFTs    Allergic rhinitis    Anxiety and depression    son commited suicide 2010   Arthritis    In her thumb   CAD (coronary artery disease)    s/p CABG   Cancer (HCC)    anal   Carotid artery disease (HCC)    Moderate - f/u dopplers needed 11/2012   Central retinal vein occlusion of left eye 02/2015   Receiving injections in eye every 5 weeks by Dr. Zadie Rhine   COPD (chronic obstructive pulmonary disease) ([place])    HTN (hypertension)    intolerant to ACEi (cough)   Hyperlipidemia    Osteopenia    PAD (peripheral artery disease) ([place])    Carotid dz as  below, and at [facility] 11/12:  critical stenosis noted just below the sheath site leading into a totally occluded SFA and a patent deep femoral artery  Panic attacks    Thyroid nodule    Past Surgical History:  Procedure Laterality Date   BIOPSY  09/07/2019   Procedure: BIOPSY;  Surgeon: Jackquline Denmark, MD;  Location: [facility];  Service: Gastroenterology;;   CORONARY ANGIOPLASTY WITH STENT PLACEMENT     "1; makes total of 3" (07/05/2012)   CORONARY ARTERY BYPASS GRAFT N/A 12/15/2013   Procedure: CORONARY ARTERY BYPASS GRAFTING (CABG);  Surgeon: Gaye Pollack, MD;  Location: [place];  Service: Open Heart Surgery;  Laterality: N/A;  Times 3 using left internal mammary artery and endoscopically harvested right saphenous vein    FLEXIBLE SIGMOIDOSCOPY N/A 09/07/2019   Procedure: FLEXIBLE SIGMOIDOSCOPY;  Surgeon: Jackquline Denmark, MD;  Location: [facility];  Service: Gastroenterology;  Laterality: N/A;   INTRAOPERATIVE TRANSESOPHAGEAL ECHOCARDIOGRAM N/A 12/15/2013   Procedure:  INTRAOPERATIVE TRANSESOPHAGEAL ECHOCARDIOGRAM;  Surgeon: Gaye Pollack, MD;  Location: [facility];  Service: Open Heart Surgery;  Laterality: N/A;   KIDNEY SURGERY  1960's   "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; called it floating; had OR to put it where it belongs" (07/05/2012)   LEFT HEART CATHETERIZATION WITH CORONARY ANGIOGRAM N/A 07/02/2011   Procedure: LEFT HEART CATHETERIZATION WITH CORONARY ANGIOGRAM;  Surgeon: Sherren Mocha, MD;  [facility]: [facility] C[facility] LAB;  Service: Cardiovascular;  Laterality: N/A;   LEFT HEART CATHETERIZATION WITH CORONARY ANGIOGRAM N/A 07/05/2012   Procedure: LEFT HEART CATHETERIZATION WITH CORONARY ANGIOGRAM;  Surgeon: Sherren Mocha, MD;  Location: [facility] CATH LAB;  Service: Cardiovascular;  Laterality: N/A;   LEFT HEART CATHETERIZATION WITH CORONARY ANGIOGRAM N/A 11/28/2013   Procedure: LEFT HEART CATHETERIZATION WITH CORONARY ANGIOGRAM;  Surgeon: Blane Ohara, MD;  Location: [facility] CATH LAB;  Service: Cardiovascular;  Laterality: N/A;   PERCUTANEOUS CORONARY INTERVENTION-BALLOON ONLY  07/02/2011   Procedure: PERCUTANEOUS CORONARY INTERVENTION-BALLOON ONLY;  Surgeon: Sherren Mocha, MD;  Location: [facility] CATH LAB;  Service: Cardiovascular;;   PERCUTANEOUS CORONARY INTERVENTION-BALLOON ONLY  07/05/2012   Procedure: PERCUTANEOUS CORONARY INTERVENTION-BALLOON ONLY;  Surgeon: Sherren Mocha, MD;  Location: [facility] CATH LAB;  Service: Cardiovascular;;   VAGINAL HYSTERECTOMY  ~ 1976   no oophorectomy    FAMILY HISTORY Family History  Problem Relation Age of Onset   Hypertension Mother    Stroke Mother    Coronary artery disease Father        CAD in female relative <50   Heart attack Father    Heart attack Brother 39   Heart Problems Sister 60       perminant pacemaker   Suicidality Son 44       2010   Healthy Grandchild    Colon cancer Neg Hx    Breast cancer Neg Hx    Stomach cancer Neg Hx    Rectal cancer Neg Hx    Esophageal cancer Neg Hx    Liver  cancer Neg Hx     SOCIAL HISTORY Social History   Tobacco Use   Smoking status: Former Smoker    Packs/day: 0.50    Years: 54.00    Pack years: 27.00    Types: Cigarettes    Quit date: 12/15/2013    Years since quitting: 6.2   Smokeless tobacco: Never Used   Tobacco comment: quit tobacco 12-2013 after CABG  Vaping Use   Vaping Use: Never used  Substance Use Topics   Alcohol use: Yes   Drug use: No         OPHTHALMIC EXAM:  Base Eye Exam    Visual Acuity (Snellen - Linear)  Right Left   Dist cc 20/20-1 CF @ 4'   Dist ph cc  NI       Tonometry (Tonopen, 1:48 PM)      Right Left   Pressure 9 9       Pupils      Pupils Dark Light Shape React APD   Right PERRL 5 4 Round Slow None   Left PERRL 5 4 Round Slow None       Visual Fields (Counting fingers)      Left Right    Full Full       Extraocular Movement      Right Left    Full Full       Neuro/Psych    Oriented x3: Yes   Mood/Affect: Normal       Dilation    Both eyes: 1.0% Mydriacyl, 2.5% Phenylephrine @ 1:48 PM        Slit Lamp and Fundus Exam    External Exam      Right Left   External Normal Normal       Slit Lamp Exam      Right Left   Lids/Lashes Normal Normal   Conjunctiva/Sclera White and quiet White and quiet   Cornea Clear Clear   Anterior Chamber Deep and quiet Deep and quiet   Iris Round and reactive Round and reactive   Lens Posterior chamber intraocular lens Posterior chamber intraocular lens   Anterior Vitreous Normal Normal       Fundus Exam      Right Left   Posterior Vitreous Posterior vitreous detachment Normal   Disc Normal Collaterals on the nerve   C/D Ratio 0.5 0.6   Macula Normal Macular thickening, Cystoid macular edema, Exudates, Intraretinal hemorrhage, chronic and stable   Vessels Inferior branch retinal artery occlusion macula, no new blockages Occlusion, with retinal choroidal anastomosis centrally in the macula which accounts for, resistance to  ongoing therapy.   Periphery Normal Peripheral PRP          IMAGING AND PROCEDURES  Imaging and Procedures for 02/27/20  OCT, Retina - OU - Both Eyes       Right Eye Quality was good. Scan locations included subfoveal. Central Foveal Thickness: 231. Progression has been stable.   Left Eye Quality was good. Scan locations included subfoveal. Central Foveal Thickness: 357. Progression has been stable.   Notes Early atrophy along the inferotemporal arcade of the retina, no areas of active thickening.  OD  OS, chronic Macular edema on the basis of old central retinal vein occlusion with subfoveal fibrosis, with less cystoid macular edema superiorly.                ASSESSMENT/PLAN:  Branch retinal artery occlusion, right eye Work-up with carotid Dopplers as well as blood work is completed.  No progression of BRAO was noted on examination or on color fundus regular is an OCT      ICD-10-CM   1. 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 Branch retinal artery occlusion, right eye  H34.231 OCT, Retina - OU - Both Eyes  3. Intermediate stage nonexudative age-related macular degeneration of both eyes  H35.3132     1.  Patient is using aspirin 325 mg daily, should continue this to protect perfusion distally in the right eye.  2.  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  Will observe and asked the patient return follow-up promptly should new difficulties or visual distortions or losses occur  Ophthalmic Meds Ordered this visit:  No orders of the defined types were placed in this encounter.      Return in about 6 months (around 08/29/2020) for DILATE OU, OCT, COLOR FP.  There are no Patient Instructions on file for this visit.   Explained the diagnoses, plan, and follow up with the patient and  they expressed understanding.  Patient expressed understanding of the importance of proper follow up care.   Clent Demark Mirha Brucato M.D. Diseases & Surgery of the Retina and Vitreous Retina & Diabetic [place] 02/27/20     Abbreviations: M myopia (nearsighted); A astigmatism; H hyperopia (farsighted); P presbyopia; Mrx spectacle prescription;  CTL contact lenses; OD right eye; OS left eye; OU both eyes  XT exotropia; ET esotropia; PEK punctate epithelial keratitis; PEE punctate epithelial erosions; DES dry eye syndrome; MGD meibomian gland dysfunction; ATs artificial tears; PFAT's preservative free artificial tears; [place] nuclear sclerotic cataract; PSC posterior subcapsular cataract; ERM epi-retinal membrane; PVD posterior vitreous detachment; RD retinal detachment; DM diabetes mellitus; DR diabetic retinopathy; NPDR non-proliferative diabetic retinopathy; PDR proliferative diabetic retinopathy; CSME clinically significant macular edema; DME diabetic macular edema; dbh dot blot hemorrhages; CWS cotton wool spot; POAG primary open angle glaucoma; C/D cup-to-disc ratio; HVF humphrey visual field; GVF goldmann visual field; OCT optical coherence tomography; IOP intraocular pressure; BRVO Branch retinal vein occlusion; CRVO central retinal vein occlusion; CRAO central retinal artery occlusion; BRAO branch retinal artery occlusion; RT retinal tear; SB scleral buckle; PPV pars plana vitrectomy; VH Vitreous hemorrhage; PRP panretinal laser photocoagulation; IVK intravitreal kenalog; VMT vitreomacular traction; MH Macular hole;  NVD neovascularization of the disc; NVE neovascularization elsewhere; AREDS age related eye disease study; ARMD age related macular degeneration; POAG primary open angle glaucoma; EBMD epithelial/anterior basement membrane dystrophy; ACIOL anterior chamber intraocular lens; IOL intraocular lens; PCIOL posterior chamber intraocular lens; Phaco/IOL phacoemulsification with intraocular lens  placement; [place] photorefractive keratectomy; LASIK laser assisted in situ keratomileusis; HTN hypertension; DM diabetes mellitus; COPD chronic obstructive pulmonary disease

## 2020-02-28 ENCOUNTER — Ambulatory Visit (INDEPENDENT_AMBULATORY_CARE_PROVIDER_SITE_OTHER): Payer: Medicare Other | Admitting: Physician Assistant

## 2020-02-28 ENCOUNTER — Encounter: Payer: Self-pay | Admitting: Physician Assistant

## 2020-02-28 VITALS — BP 130/60 | HR 83 | Ht 62.0 in | Wt 105.6 lb

## 2020-02-28 DIAGNOSIS — H34231 Retinal artery branch occlusion, right eye: Secondary | ICD-10-CM | POA: Diagnosis not present

## 2020-02-28 DIAGNOSIS — I2510 Atherosclerotic heart disease of native coronary artery without angina pectoris: Secondary | ICD-10-CM

## 2020-02-28 DIAGNOSIS — E785 Hyperlipidemia, unspecified: Secondary | ICD-10-CM | POA: Diagnosis not present

## 2020-02-28 DIAGNOSIS — I6523 Occlusion and stenosis of bilateral carotid arteries: Secondary | ICD-10-CM

## 2020-02-28 DIAGNOSIS — I10 Essential (primary) hypertension: Secondary | ICD-10-CM

## 2020-02-28 MED ORDER — EZETIMIBE 10 MG PO TABS
10.0000 mg | ORAL_TABLET | Freq: Every day | ORAL | 1 refills | Status: DC
Start: 2020-02-28 — End: 2021-01-03

## 2020-02-28 NOTE — Progress Notes (Signed)
Cardiology Office Note:    Date:  02/28/2020   ID:  Patricia [name], DOB [birth year], MRN [id number]  PCP:  Colon Branch, MD  Cardiologist:  Sherren Mocha, MD   Electrophysiologist:  None   Referring MD: Colon Branch, MD   Chief Complaint:  Follow-up (CAD) and R branch retinal artery occlusion    Patient Profile:    Patricia [name] is a 79 y.o. female with:   Coronary artery disease  S/p multiple PCI procedures   S/p CABG in 5/15 (L-OM1, S-OM2, S-RCA)  Carotid artery dz  Hyperlipidemia   Hypertension   COPD  Peripheral arterial disease   Anal CA s/p chemo/radiation  Prior CV studies: Carotid US 02/06/2020 Bilateral ICA 1-39, heavy plaque burden; repeat 1 year  ABIs 08/28/17 R 0.49; L 0.81   Cardiac catheterization 11/28/13 Final Conclusions:   1. Severe 2 vessel coronary artery disease with chronic severe stenosis of the right coronary artery collateralized from the LCA and severe stenosis of the first OM branch of the circumflex 2. Calcified but nonobstructive LAD stenosis 3. Preserved LV systolic function 4. Severe aortic calcification  Echocardiogram 07/04/11 Mild LVH, EF 60-65, no RWMA   History of Present Illness:    Ms. Radich was last seen by Dr. Burt Knack in 12/2018 via Telemedicine.  She was recently seen by primary care after a visit with ophthalmology.  Evaluation there had demonstrated a R branch retinal artery occlusion.  A carotid US showed just 1-39% bilat ICA stenosis.  Given heavy plaque burden, f/u is recommended in 1 year.  She has an echocardiogram pending.      She is here today with her niece.  She has not had chest discomfort, significant shortness of breath, orthopnea, leg swelling or syncope.  She has not regained sight in her right eye.     Past Medical History:  Diagnosis Date  . Abnormal LFTs   . Allergic rhinitis   . Anxiety and depression    son commited suicide 2010  . 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 CAD (coronary artery disease)     s/p CABG  . Cancer [facility])    anal  . Carotid artery disease ([place])    Moderate - f/u dopplers needed 11/2012  . 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 Zadie Rhine  . COPD (chronic obstructive pulmonary disease) ([place])   . HTN (hypertension)    intolerant to ACEi (cough)  . Hyperlipidemia   . Osteopenia   . PAD (peripheral artery disease) ([place])    Carotid dz as  below, and at [facility] 11/12:  critical stenosis noted just below the sheath site leading into a totally occluded SFA and a patent deep femoral artery  . Panic attacks   . Thyroid nodule     Current Medications: Current Meds  Medication Sig  . amLODipine (NORVASC) 10 MG tablet Take 1 tablet (10 mg total) by mouth daily.  [place] Kitchen aspirin 325 MG tablet Take 325 mg by mouth daily.  [place] Kitchen atorvastatin (LIPITOR) 80 MG tablet Take 1 tablet (80 mg total) by mouth at bedtime.  . budesonide-formoterol (SYMBICORT) 160-4.5 MCG/ACT inhaler Inhale 2 puffs into the lungs 2 (two) times daily.  . Cholecalciferol (VITAMIN D3) 1000 UNITS CAPS Take 1,000 Units by mouth daily.  . clonazePAM (KLONOPIN) 0.5 MG tablet TAKE 1 TABLET BY MOUTH IN THE MORNING AS NEEDED FOR ANXIETY AND 1 & 1/2 TO  2 (ONE & ONE-HALF TO TWO) AT BEDTIME AS NEEDED FOR SLEEP  . FLUoxetine (PROZAC) 20 MG capsule Take 2 capsules (40 mg total) by mouth daily.  Marland Kitchen losartan (COZAAR) 100 MG tablet Take 1 tablet by mouth once daily  . potassium chloride SA (KLOR-CON) 20 MEQ tablet Take 10 mEq by mouth daily.     Allergies:   Ace inhibitors   Social History   Tobacco Use  . Smoking status: Former Smoker    Packs/day: 0.50    Years: 54.00    Pack years: 27.00    Types: Cigarettes    Quit date: 12/15/2013    Years since quitting: 6.2  . Smokeless tobacco: Never Used  . Tobacco comment: quit tobacco 12-2013 after CABG  Vaping Use  . Vaping Use: Never used  Substance Use Topics  . Alcohol use: Yes  . Drug use: No     Family  Hx: The patient's family history includes Coronary artery disease in her father; Healthy in her grandchild; Heart Problems (age of onset: 78) in her sister; Heart attack in her father; Heart attack (age of onset: 71) in her brother; Hypertension in her mother; Stroke in her mother; Suicidality (age of onset: 83) in her son. There is no history of Colon cancer, Breast cancer, Stomach cancer, Rectal cancer, Esophageal cancer, or Liver cancer.  ROS   EKGs/Labs/Other Test Reviewed:    EKG:  EKG is   ordered today.  The ekg ordered today demonstrates normal sinus rhythm, heart rate is 83, normal axis, nonspecific ST-T wave changes, QTC 446, no change from prior tracing  Recent Labs: 10/24/2019: ALT 12 12/06/2019: Hemoglobin 12.6; Platelet Count 202 12/15/2019: Magnesium 1.8 01/25/2020: BUN 7; Creatinine, Ser 0.52; Potassium 3.8; Sodium 141   Recent Lipid Panel Lab Results  Component Value Date/Time   CHOL 149 01/25/2020 11:27 AM   TRIG 110.0 01/25/2020 11:27 AM   HDL 51.00 01/25/2020 11:27 AM   CHOLHDL 3 01/25/2020 11:27 AM   LDLCALC 76 01/25/2020 11:27 AM   LDLDIRECT 156.0 04/26/2007 09:36 AM    Physical Exam:    VS:  BP 130/60   Pulse 83   Ht 5\' 2"  (1.575 m)   Wt 105 lb 9.6 oz (47.9 kg)   SpO2 95%   BMI 19.31 kg/m     Wt Readings from Last 3 Encounters:  02/28/20 105 lb 9.6 oz (47.9 kg)  01/25/20 108 lb 8 oz (49.2 kg)  01/16/20 107 lb 1.6 oz (48.6 kg)     Constitutional:      Appearance: Healthy appearance. Not in distress.  Neck:     Vascular: JVD normal.  Pulmonary:     Effort: Pulmonary effort is normal.     Breath sounds: No wheezing. No rales.  Cardiovascular:     Normal rate. Regular rhythm. Normal S1. Normal S2.     Murmurs: There is no murmur.  Edema:    Peripheral edema absent.  Abdominal:     Palpations: Abdomen is soft.  Skin:    General: Skin is warm and dry.  Neurological:     Mental Status: Alert and oriented to person, place and time.     Cranial  Nerves: Cranial nerves are intact.      ASSESSMENT & PLAN:    1. Branch retinal artery occlusion, right eye 2. Carotid stenosis, bilateral Her niece notes that she had a significant coughing spell prior to this occurring.  I suspect that the heavy plaque burden in her carotid  arteries contributed to her event.  At this point, I do not think that she needs an event monitor to search for atrial fibrillation.  She does need an echocardiogram.  I will make sure that this is done.  Her ophthalmologist increased her aspirin to 325 mg daily.  This should be okay for now.  We could consider adding clopidogrel and decreasing her aspirin to 81 mg daily.  I will review further with Dr. Burt Knack whether or not to send her to vascular surgery for further evaluation of her carotid arteries.  Given the heavy plaque burden, I question whether or not head and neck CTA would be beneficial.  We discussed the importance of aggressive blood pressure and cholesterol management.  3. Coronary artery disease involving native coronary artery of native heart without angina pectoris History of multiple prior PCI's and eventual CABG in 2015.  She is doing well without anginal symptoms.  Continue aspirin, atorvastatin.  4. Essential hypertension Blood pressure somewhat borderline.  She notes higher blood pressures at home.  Primary care recently increased her losartan.  I have asked her to monitor her blood pressure over the next couple of weeks and send those to me for review.  If her pressures remain elevated, we could consider beta-blocker therapy.  5. Hyperlipidemia, unspecified hyperlipidemia type Recent LDL 76.  She remains on atorvastatin 80 mg daily.  Goal LDL should be less than 70 (ideally less than 55).  Add ezetimibe 10 mg daily.  Obtain fasting lipids and LFTs in 3 months    Dispo:  Return in about 3 months (around 05/30/2020) for Routine Follow Up, w/ Dr. Burt Knack, or Richardson Dopp, PA-C, in person.   Medication  Adjustments/Labs and Tests Ordered: Current medicines are reviewed at length with the patient today.  Concerns regarding medicines are outlined above.  Tests Ordered: Orders Placed This Encounter  Procedures  . Comprehensive metabolic panel  . Lipid panel  . EKG 12-Lead  . ECHOCARDIOGRAM COMPLETE   Medication Changes: Meds ordered this encounter  Medications  . ezetimibe (ZETIA) 10 MG tablet    Sig: Take 1 tablet (10 mg total) by mouth daily.    Dispense:  90 tablet    Refill:  1    Signed, Richardson Dopp, PA-C  02/28/2020 3:41 PM    [facility] [address], [place], [place]  [address] Phone: [phone]; Fax: [phone]

## 2020-02-28 NOTE — Patient Instructions (Addendum)
Medication Instructions:  Your physician has recommended you make the following change in your medication:   1) Start Zetia 10 mg, 1 tablet by mouth once a day  *If you need a refill on your cardiac medications before your next appointment, please call your pharmacy*  Lab Work: Your physician recommends that you return for lab work in October, I will call Dr. Larose Kells to see if their office can check cholesterol labs.  Testing/Procedures: Your physician has requested that you have an echocardiogram. Echocardiography is a painless test that uses sound waves to create images of your heart. It provides your doctor with information about the size and shape of your heart and how well your heart's chambers and valves are working. This procedure takes approximately one hour. There are no restrictions for this procedure.  Follow-Up: At [facility], you and your health needs are our priority.  As part of our continuing mission to provide you with exceptional heart care, we have created designated Provider Care Teams.  These Care Teams include your primary Cardiologist (physician) and Advanced Practice Providers (APPs -  Physician Assistants and Nurse Practitioners) who all work together to provide you with the care you need, when you need it.  We recommend signing up for the patient portal called "MyChart".  Sign up information is provided on this After Visit Summary.  MyChart is used to connect with patients for Virtual Visits (Telemedicine).  Patients are able to view lab/test results, encounter notes, upcoming appointments, etc.  Non-urgent messages can be sent to your provider as well.   To learn more about what you can do with MyChart, go to [email or web address].    Your next appointment:   3 month(s)  The format for your next appointment:   In Person  Provider:   You may see Sherren Mocha, MD or Richardson Dopp, PA-C  Other Instructions Check your blood pressure for 2 weeks and send readings  through my chart.

## 2020-02-29 ENCOUNTER — Telehealth: Payer: Self-pay | Admitting: Physician Assistant

## 2020-02-29 NOTE — Telephone Encounter (Signed)
I called and spoke with patient, she is aware per Nicki Reaper and Dr. Burt Knack she does not need to see vascular surgery and that our office will order repeat carotid ultrasounds in a year. Patient verbalized understanding and thanked me for the call. Reminder set up for carotid ultrasounds in one year.

## 2020-02-29 NOTE — Telephone Encounter (Signed)
Please tell patient I spoke with Dr. Burt Knack regarding her carotid dz. I discussed with the patient yesterday that we may need to consider having her see vascular surgery to further evaluate.  Dr. Burt Knack looked at her carotid study and did not think that was necessary. So, she does NOT need to see vascular surgery. We just need to repeat her Carotid US in 1 year.  Please make sure she has an order for a Carotid US in 1 year. Richardson Dopp, PA-C    02/29/2020 2:30 PM

## 2020-03-15 ENCOUNTER — Other Ambulatory Visit: Payer: Self-pay

## 2020-03-15 ENCOUNTER — Ambulatory Visit (HOSPITAL_COMMUNITY): Payer: Medicare Other | Attending: Internal Medicine

## 2020-03-15 DIAGNOSIS — H34231 Retinal artery branch occlusion, right eye: Secondary | ICD-10-CM | POA: Insufficient documentation

## 2020-03-15 LAB — ECHOCARDIOGRAM COMPLETE
Area-P 1/2: 3.48 cm2
S' Lateral: 3.3 cm

## 2020-03-16 ENCOUNTER — Encounter: Payer: Self-pay | Admitting: Physician Assistant

## 2020-03-16 DIAGNOSIS — H34239 Retinal artery branch occlusion, unspecified eye: Secondary | ICD-10-CM | POA: Insufficient documentation

## 2020-03-19 NOTE — Telephone Encounter (Signed)
BP above goal. PLAN:  Start Metoprolol succinate 25 mg once daily. Richardson Dopp, PA-C    03/19/2020 8:59 AM

## 2020-03-20 ENCOUNTER — Other Ambulatory Visit: Payer: Self-pay

## 2020-03-20 MED ORDER — METOPROLOL SUCCINATE ER 25 MG PO TB24
25.0000 mg | ORAL_TABLET | Freq: Every day | ORAL | 1 refills | Status: DC
Start: 2020-03-20 — End: 2020-12-10

## 2020-03-29 DIAGNOSIS — Z85048 Personal history of other malignant neoplasm of rectum, rectosigmoid junction, and anus: Secondary | ICD-10-CM | POA: Diagnosis not present

## 2020-04-02 ENCOUNTER — Telehealth: Payer: Self-pay | Admitting: Internal Medicine

## 2020-04-03 NOTE — Telephone Encounter (Signed)
Gets oxycodone from hematology oncology, okay to refill clonazepam

## 2020-04-03 NOTE — Telephone Encounter (Signed)
Clonazepam refill.   Last OV: 01/25/2020 Last Fill: 08/01/2019 #60 and 3RF UDS: 07/06/2019 Low risk

## 2020-04-13 ENCOUNTER — Telehealth: Payer: Self-pay | Admitting: Internal Medicine

## 2020-04-13 NOTE — Progress Notes (Signed)
°  Chronic Care Management   Outreach Note  04/13/2020 Name: Patricia [name] MRN: [id number] DOB: October 29, 1940  Referred by: Colon Branch, MD Reason for referral : No chief complaint on file.   An unsuccessful telephone outreach was attempted today. The patient was referred to the pharmacist for assistance with care management and care coordination.   Follow Up Plan:   Carley Perdue [employer] Scheduler

## 2020-04-26 ENCOUNTER — Telehealth: Payer: Self-pay | Admitting: Internal Medicine

## 2020-04-26 NOTE — Progress Notes (Signed)
  Chronic Care Management   Note  04/26/2020 Name: FLORITA NITSCH MRN: [id number] DOB: 04-08-1941  BRITTONY BILLICK is a 79 y.o. year old female who is a primary care patient of Colon Branch, MD. I reached out to [name] by phone today in response to a referral sent by Ms. Leslie Andrea Angevine's PCP, Colon Branch, MD.   Ms. Straley was given information about Chronic Care Management services today including:  1. CCM service includes personalized support from designated clinical staff supervised by her physician, including individualized plan of care and coordination with other care providers 2. 24/7 contact phone numbers for assistance for urgent and routine care needs. 3. Service will only be billed when office clinical staff spend 20 minutes or more in a month to coordinate care. 4. Only one practitioner may furnish and bill the service in a calendar month. 5. The patient may stop CCM services at any time (effective at the end of the month) by phone call to the office staff.   Patient agreed to services and verbal consent obtained.   Follow up plan:   Carley Perdue [employer] Scheduler

## 2020-05-24 ENCOUNTER — Telehealth: Payer: Self-pay | Admitting: Internal Medicine

## 2020-05-25 ENCOUNTER — Other Ambulatory Visit: Payer: Self-pay | Admitting: Internal Medicine

## 2020-05-25 MED ORDER — CLONAZEPAM 0.5 MG PO TABS
ORAL_TABLET | ORAL | 0 refills | Status: DC
Start: 2020-05-25 — End: 2020-07-31

## 2020-05-25 NOTE — Telephone Encounter (Signed)
Requesting: clonazepam 0.5mg  Contract: 05/03/2018 UDS: 07/06/2019 Low risk Last Visit: 01/25/2020  Next Visit: 05/29/2020 Last Refill: 04/03/2020 #60 and 0RF Pt sig: 1 tab daily in the morning prn, and 1.5-2 tablets at bedtime prn  Please Advise

## 2020-05-25 NOTE — Telephone Encounter (Signed)
Prescription sent

## 2020-05-25 NOTE — Telephone Encounter (Addendum)
E-scribe down. Rx faxed to [place]. Received fax confirmation.

## 2020-05-29 ENCOUNTER — Encounter: Payer: Self-pay | Admitting: Internal Medicine

## 2020-05-29 ENCOUNTER — Ambulatory Visit (INDEPENDENT_AMBULATORY_CARE_PROVIDER_SITE_OTHER): Payer: Medicare Other | Admitting: Internal Medicine

## 2020-05-29 ENCOUNTER — Other Ambulatory Visit: Payer: Self-pay

## 2020-05-29 ENCOUNTER — Ambulatory Visit (HOSPITAL_BASED_OUTPATIENT_CLINIC_OR_DEPARTMENT_OTHER)
Admission: RE | Admit: 2020-05-29 | Discharge: 2020-05-29 | Disposition: A | Discharge status: Home / Self Care | Payer: Medicare Other | Source: Ambulatory Visit | Attending: Internal Medicine | Admitting: Internal Medicine

## 2020-05-29 VITALS — BP 146/72 | HR 71 | Temp 97.7°F | Resp 16 | Ht 63.0 in | Wt 97.5 lb

## 2020-05-29 DIAGNOSIS — F32A Depression, unspecified: Secondary | ICD-10-CM

## 2020-05-29 DIAGNOSIS — E041 Nontoxic single thyroid nodule: Secondary | ICD-10-CM

## 2020-05-29 DIAGNOSIS — Z79899 Other long term (current) drug therapy: Secondary | ICD-10-CM | POA: Diagnosis not present

## 2020-05-29 DIAGNOSIS — I10 Essential (primary) hypertension: Secondary | ICD-10-CM | POA: Diagnosis not present

## 2020-05-29 DIAGNOSIS — R634 Abnormal weight loss: Secondary | ICD-10-CM

## 2020-05-29 DIAGNOSIS — J449 Chronic obstructive pulmonary disease, unspecified: Secondary | ICD-10-CM | POA: Diagnosis not present

## 2020-05-29 DIAGNOSIS — I6523 Occlusion and stenosis of bilateral carotid arteries: Secondary | ICD-10-CM

## 2020-05-29 DIAGNOSIS — Z23 Encounter for immunization: Secondary | ICD-10-CM

## 2020-05-29 DIAGNOSIS — F419 Anxiety disorder, unspecified: Secondary | ICD-10-CM

## 2020-05-29 NOTE — Progress Notes (Signed)
Pre visit review using our clinic review tool, if applicable. No additional management support is needed unless otherwise documented below in the visit note. 

## 2020-05-29 NOTE — Progress Notes (Signed)
Subjective:    Patient ID: Patricia [name], female    DOB: 08/15/1940, 79 y.o.   MRN: [id number]  DOS:  05/29/2020 Type of visit - description: Follow-up  Notes from cardiology reviewed  I noted weight loss, she was not aware. Denies fever chills No night sweats Appetite is "not bad" and food intake is okay. Denies nausea, vomiting, diarrhea.  No postprandial abdominal pain No tremors Admits to occasional cough without sputum or hemoptysis.  Wt Readings from Last 3 Encounters:  05/30/20 101 lb 9.6 oz (46.1 kg)  05/29/20 97 lb 8 oz (44.2 kg)  02/28/20 105 lb 9.6 oz (47.9 kg)   BP Readings from Last 3 Encounters:  05/30/20 (!) 146/90  05/29/20 (!) 146/72  02/28/20 130/60     Review of Systems Vision: At baseline.   Past Medical History:  Diagnosis Date  . Abnormal LFTs   . Allergic rhinitis   . Anxiety and depression    son commited suicide 2010  . 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 Branch retinal artery occlusion    Echocardiogram 8/21:  EF 59, no RWMA, mild LVH, Gr 1 DD, normal RVSF, mild MR [no intracardiac source of embolism]  . CAD (coronary artery disease)    s/p CABG  . Cancer [facility])    anal  . Carotid artery disease ([place])    Moderate - f/u dopplers needed 11/2012  . 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 Zadie Rhine  . COPD (chronic obstructive pulmonary disease) ([place])   . HTN (hypertension)    intolerant to ACEi (cough)  . Hyperlipidemia   . Osteopenia   . PAD (peripheral artery disease) ([place])    Carotid dz as  below, and at [facility] 11/12:  critical stenosis noted just below the sheath site leading into a totally occluded SFA and a patent deep femoral artery  . Panic attacks   . Thyroid nodule     Past Surgical History:  Procedure Laterality Date  . BIOPSY  09/07/2019   Procedure: BIOPSY;  Surgeon: Jackquline Denmark, MD;  Location: [facility];  Service: Gastroenterology;;  . CORONARY ANGIOPLASTY WITH  STENT PLACEMENT     "1; makes total of 3" (07/05/2012)  . CORONARY ARTERY BYPASS GRAFT N/A 12/15/2013   Procedure: CORONARY ARTERY BYPASS GRAFTING (CABG);  Surgeon: Gaye Pollack, MD;  Location: [facility];  Service: Open Heart Surgery;  Laterality: N/A;  Times 3 using left internal mammary artery and endoscopically harvested right saphenous vein   . FLEXIBLE SIGMOIDOSCOPY N/A 09/07/2019   Procedure: FLEXIBLE SIGMOIDOSCOPY;  Surgeon: Jackquline Denmark, MD;  Location: [facility];  Service: Gastroenterology;  Laterality: N/A;  . INTRAOPERATIVE TRANSESOPHAGEAL ECHOCARDIOGRAM N/A 12/15/2013   Procedure: INTRAOPERATIVE TRANSESOPHAGEAL ECHOCARDIOGRAM;  Surgeon: Gaye Pollack, MD;  Location: [facility] OR;  Service: Open Heart Surgery;  Laterality: N/A;  . 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 LEFT HEART CATHETERIZATION WITH CORONARY ANGIOGRAM N/A 07/02/2011   Procedure: LEFT HEART CATHETERIZATION WITH CORONARY ANGIOGRAM;  Surgeon: Sherren Mocha, MD;  Location: [facility] CATH LAB;  Service: Cardiovascular;  Laterality: N/A;  . LEFT HEART CATHETERIZATION WITH CORONARY ANGIOGRAM N/A 07/05/2012   Procedure: LEFT HEART CATHETERIZATION WITH CORONARY ANGIOGRAM;  Surgeon: Sherren Mocha, MD;  Location: [facility] CATH LAB;  Service: Cardiovascular;  Laterality: N/A;  . LEFT HEART CATHETERIZATION  WITH CORONARY ANGIOGRAM N/A 11/28/2013   Procedure: LEFT HEART CATHETERIZATION WITH CORONARY ANGIOGRAM;  Surgeon: Blane Ohara, MD;  Location: [facility] CATH LAB;  Service: Cardiovascular;  Laterality: N/A;  . PERCUTANEOUS CORONARY INTERVENTION-BALLOON ONLY  07/02/2011   Procedure: PERCUTANEOUS CORONARY INTERVENTION-BALLOON ONLY;  Surgeon: Sherren Mocha, MD;  Location: [facility] CATH LAB;  Service: Cardiovascular;;  . PERCUTANEOUS CORONARY INTERVENTION-BALLOON ONLY  07/05/2012   Procedure: PERCUTANEOUS CORONARY INTERVENTION-BALLOON ONLY;  Surgeon: Sherren Mocha,  MD;  Location[facility] C[facility] LAB;  Service: Cardiovascular;;  . VAGINAL HYSTERECTOMY  ~ 1976   no oophorectomy    Allergies as of 05/29/2020      Reactions   Ace Inhibitors    cough      Medication List       Accurate as of May 29, 2020 11:59 PM. If you have any questions, ask your nurse or doctor.        amLODipine 10 MG tablet Commonly known as: NORVASC Take 1 tablet (10 mg total) by mouth daily.   aspirin 325 MG tablet Take 325 mg by mouth daily.   atorvastatin 80 MG tablet Commonly known as: LIPITOR Take 1 tablet (80 mg total) by mouth at bedtime.   budesonide-formoterol 160-4.5 MCG/ACT inhaler Commonly known as: SYMBICORT Inhale 2 puffs into the lungs 2 (two) times daily.   clonazePAM 0.5 MG tablet Commonly known as: KLONOPIN TAKE 1 TABLET BY MOUTH IN THE MORNING AS NEEDED FOR ANXIETY AND 1 & 1/2 TO 2 (ONE & ONE-HALF TO TWO) AT BEDTIME AS NEEDED FOR SLEEP   ezetimibe 10 MG tablet Commonly known as: ZETIA Take 1 tablet (10 mg total) by mouth daily.   FLUoxetine 20 MG capsule Commonly known as: PROZAC Take 2 capsules (40 mg total) by mouth daily.   losartan 100 MG tablet Commonly known as: COZAAR Take 1 tablet by mouth once daily   metoprolol succinate 25 MG 24 hr tablet Commonly known as: Toprol XL Take 1 tablet (25 mg total) by mouth daily.   potassium chloride SA 20 MEQ tablet Commonly known as: KLOR-CON Take 10 mEq by mouth daily.   Vitamin D3 25 MCG (1000 UT) Caps Take 1,000 Units by mouth daily.          Objective:   Physical Exam Neck:     BP (!) 146/72 (BP Location: Left Arm, Patient Position: Sitting, Cuff Size: Small)   Pulse 71   Temp 97.7 F (36.5 C) (Oral)   Resp 16   Ht 5\' 3"  (1.6 m)   Wt 97 lb 8 oz (44.2 kg)   SpO2 93%   BMI 17.27 kg/m  General:   Well developed, NAD, BMI noted.  HEENT:  Normocephalic . Face symmetric, atraumatic Neck: + Right thyromegaly, see graphic. Lymphatic system: No lymphadenopathies at the  neck, supraclavicular, axillary or inguinal areas. Lungs:  CTA B Normal respiratory effort, no intercostal retractions, no accessory muscle use. Heart: RRR,  no murmur.  Abdomen:  Not distended, soft, non-tender. No rebound or rigidity.   Skin: Not pale. Not jaundice Lower extremities: no pretibial edema bilaterally  Neurologic:  alert & oriented X3.  Speech normal, gait appropriate for age and unassisted Psych--  Cognition and judgment appear intact.  Cooperative with normal attention span and concentration.  Behavior appropriate. No anxious or depressed appearing.     Assessment     Assessment   Hyperglycemia, A1c 5.7 HTN Hyperlipidemia Anxiety depression COPD , quit tobacco 2015 DJD CV: --CAD,cath 2012, 2013, CABG 2015 --PAD -  ABIs 2012, 07-2015 : Stable moderate disease left, normal right  --Carotid disease   --Central Retinal vein occlusion 02-2015 --Palpable aorta US abdomen 06-2015 no AA   Increase LFTs, elevated alkaline phosphatase , [place] abd neg 06-2015  Thyroid nodule --[place] 2012 stable (since 2009) Anal cancer:  DX 08-2019, status post chemotherapy.  On clinical remission as of 01/2020   PLAN Hyperglycemia: Last A1c very good. Carotid artery disease, right branch retinal artery occlusion: Saw cardiology 02/28/2020, do not need to rule out  A. fib at the time.  Echo 03-2020: Normal EF, no source of emboli.  They recommend a follow-up carotid US 01/2021. They are considering decrease aspirin back to 81 and add Plavix.   HTN: Currently on amlodipine, losartan, metoprolol and potassium.  Ambulatory BPs are checked but she does not recall the readings.  BP was elevated today, recheck: 146/72.  No change, check a BMP Anxiety depression: On clonazepam, contract and UDS today. Weight loss: Unclear etiology, no night sweats, no lymphadenopathies or organomegaly. Last CT lung ca screening 2019: (-) Check CBC, TSH, CXR Thyroid nodule: Found per CT incidentally 10-2017, at  that time she declined work-up but we agreed to proceed with ultrasound. Preventive care:  Flu shot today Had 2 Covid vaccines, rec  booster   RTC 3 months    This visit occurred during the SARS-CoV-2 public health emergency.  Safety protocols were in place, including screening questions prior to the visit, additional usage of staff PPE, and extensive cleaning of exam room while observing appropriate contact time as indicated for disinfecting solutions.

## 2020-05-29 NOTE — Patient Instructions (Addendum)
Check the  blood pressure 2 or 3 times a  Week BP GOAL is between 110/65 and  135/85. If it is consistently higher or lower, let me know  Proceed with Covid booster at your earliest convenience   GO TO THE LAB : Get the blood work     [place], [place] Come back for  A check up in 3 months       STOP BY THE FIRST FLOOR:  get the XR

## 2020-05-29 NOTE — Progress Notes (Signed)
Cardiology Office Note:    Date:  05/30/2020   ID:  Patricia [name], Patricia [name] 06/24/1941, MRN [id number]  PCP:  Colon Branch, MD  [facility] Cardiologist:  Sherren Mocha, MD   [facility] Electrophysiologist:  None   Referring MD: Colon Branch, MD   Chief Complaint:  Follow-up (branch retinal artery occlusion; CAD; HTN)    Patient Profile:    Patricia [name] is a 79 y.o. female with:   Coronary artery disease ? S/p multiple PCI procedures  ? S/p CABG in 5/15 (L-OM1, S-OM2, S-RCA)  Hx of R branch retinal artery occlusion   Carotid artery dz  [place] 6/21: bilat ICA 1-39, heavy plaque burden (rpt 1 year)  Hyperlipidemia   Hypertension   COPD  Peripheral arterial disease   Anal CA s/p chemo/radiation  Prior CV studies: Echocardiogram 03/15/20 EF 59, no RWMA, Gr 1 DD, normal RVSF, mild MR  Carotid US 02/06/2020 Bilateral ICA 1-39, heavy plaque burden; repeat 1 year  ABIs 08/28/17 R 0.49; L 0.81   Cardiac catheterization 11/28/13 Final Conclusions:  1. Severe 2 vessel coronary artery disease with chronic severe stenosis of the right coronary artery collateralized from the LCA and severe stenosis of the first OM branch of the circumflex 2. Calcified but nonobstructive LAD stenosis 3. Preserved LV systolic function 4. Severe aortic calcification  Echocardiogram 07/04/11 Mild LVH, EF 60-65, no RWMA  History of Present Illness:    Patricia [name] was last seen in 7/21.  She had been dx with a R branch retinal artery occlusion.  She had mild stenosis with heavy plaque burden on carotid US.  She will need a repeat US in 1 year.  An echocardiogram demonstrated normal EF.  She returns for follow up.  She is here alone. She has not had any chest pain, shortness of breath, syncope.  She has not had any further issues with her eyes.        Past Medical History:  Diagnosis Date  . Abnormal LFTs   . Allergic rhinitis   . Anxiety and depression    son commited suicide 2010  .  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 Branch retinal artery occlusion    Echocardiogram 8/21:  EF 59, no RWMA, mild LVH, Gr 1 DD, normal RVSF, mild MR [no intracardiac source of embolism]  . CAD (coronary artery disease)    s/p CABG  . Cancer [facility])    anal  . Carotid artery disease ([place])    Moderate - f/u dopplers needed 11/2012  . 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 Zadie Rhine  . COPD (chronic obstructive pulmonary disease) ([place])   . HTN (hypertension)    intolerant to ACEi (cough)  . Hyperlipidemia   . Osteopenia   . PAD (peripheral artery disease) ([place])    Carotid dz as  below, and at [facility] 11/12:  critical stenosis noted just below the sheath site leading into a totally occluded SFA and a patent deep femoral artery  . Panic attacks   . Thyroid nodule     Current Medications: Current Meds  Medication Sig  . amLODipine (NORVASC) 10 MG tablet Take 1 tablet (10 mg total) by mouth daily.  Marland Kitchen aspirin 325 MG tablet Take 325 mg by mouth daily.  Marland Kitchen atorvastatin (LIPITOR) 80 MG tablet Take 1 tablet (80 mg total) by mouth at bedtime.  . budesonide-formoterol (SYMBICORT) 160-4.5 MCG/ACT inhaler  Inhale 2 puffs into the lungs 2 (two) times daily.  . Cholecalciferol (VITAMIN D3) 1000 UNITS CAPS Take 1,000 Units by mouth daily.  . clonazePAM (KLONOPIN) 0.5 MG tablet TAKE 1 TABLET BY MOUTH IN THE MORNING AS NEEDED FOR ANXIETY AND 1 & 1/2 TO 2 (ONE & ONE-HALF TO TWO) AT BEDTIME AS NEEDED FOR SLEEP  . ezetimibe (ZETIA) 10 MG tablet Take 1 tablet (10 mg total) by mouth daily.  Marland Kitchen FLUoxetine (PROZAC) 20 MG capsule Take 2 capsules (40 mg total) by mouth daily.  Marland Kitchen losartan (COZAAR) 100 MG tablet Take 1 tablet by mouth once daily  . metoprolol succinate (TOPROL XL) 25 MG 24 hr tablet Take 1 tablet (25 mg total) by mouth daily.  . potassium chloride SA (KLOR-CON) 20 MEQ tablet Take 10 mEq by mouth daily.     Allergies:   Ace inhibitors   Social  History   Tobacco Use  . Smoking status: Former Smoker    Packs/day: 0.50    Years: 54.00    Pack years: 27.00    Types: Cigarettes    Quit date: 12/15/2013    Years since quitting: 6.4  . Smokeless tobacco: Never Used  . Tobacco comment: quit tobacco 12-2013 after CABG  Vaping Use  . Vaping Use: Never used  Substance Use Topics  . Alcohol use: Yes  . Drug use: No     Family Hx: The patient's family history includes Coronary artery disease in her father; Healthy in her grandchild; Heart Problems (age of onset: 67) in her sister; Heart attack in her father; Heart attack (age of onset: 21) in her brother; Hypertension in her mother; Stroke in her mother; Suicidality (age of onset: 35) in her son. There is no history of Colon cancer, Breast cancer, Stomach cancer, Rectal cancer, Esophageal cancer, or Liver cancer.  Review of Systems  Neurological: Negative for focal weakness.     EKGs/Labs/Other Test Reviewed:    EKG:  EKG is not ordered today.  The ekg ordered today demonstrates n/a  Recent Labs: 10/24/2019: ALT 12 12/15/2019: Magnesium 1.8 05/29/2020: BUN 11; Creat 0.59; Hemoglobin 14.0; Platelets 181; Potassium 3.5; Sodium 144; TSH 2.06   Recent Lipid Panel Lab Results  Component Value Date/Time   CHOL 149 01/25/2020 11:27 AM   TRIG 110.0 01/25/2020 11:27 AM   HDL 51.00 01/25/2020 11:27 AM   CHOLHDL 3 01/25/2020 11:27 AM   LDLCALC 76 01/25/2020 11:27 AM   LDLDIRECT 156.0 04/26/2007 09:36 AM      Risk Assessment/Calculations:      Physical Exam:    VS:  BP (!) 146/90   Pulse 77   Ht 5\' 3"  (1.6 m)   Wt 101 lb 9.6 oz (46.1 kg)   SpO2 93%   BMI 18.00 kg/m     Wt Readings from Last 3 Encounters:  05/30/20 101 lb 9.6 oz (46.1 kg)  05/29/20 97 lb 8 oz (44.2 kg)  02/28/20 105 lb 9.6 oz (47.9 kg)     Constitutional:      Appearance: Healthy appearance. Not in distress.  Pulmonary:     Effort: Pulmonary effort is normal.     Breath sounds: No wheezing. No rales.   Cardiovascular:     Normal rate. Regular rhythm. Normal S1. Normal S2.     Murmurs: There is no murmur.  Edema:    Peripheral edema absent.  Abdominal:     Palpations: Abdomen is soft.  Musculoskeletal:     Cervical back: Neck supple.  Skin:    General: Skin is warm and dry.  Neurological:     Mental Status: Alert and oriented to person, place and time.     Cranial Nerves: Cranial nerves are intact.       ASSESSMENT & PLAN:    1. Branch retinal artery occlusion, right eye As noted, she had no significant stenosis on carotid Dopplers.  She does have heavy plaque burden and will need repeat Dopplers in 1 year.  Echocardiogram demonstrated normal LV function.  No further CV testing is indicated at this time.  Continue aspirin, statin therapy.  2. Carotid stenosis, bilateral Repeat carotid US in 1 year.  Continue aspirin, atorvastatin.  3. Coronary artery disease involving native coronary artery of native heart without angina pectoris History of multiple prior PCI's and eventual CABG in 2015.  She is doing well without anginal symptoms.  Continue aspirin, atorvastatin.  4. Essential hypertension Blood pressure above target.  Repeat blood pressure by me 140/60.  It sounds as though she may have a diet that is somewhat rich in salt.  She does not add salt to her foods but does eat out quite a bit and eats frozen dinners a couple of times a week.  I have provided her with low-salt diet information.  I have asked her to continue to monitor blood pressure.  Goal is <130/80.  If her blood pressure remains greater than this, consider increasing metoprolol succinate to 50 mg daily.     Dispo:  Return in about 6 months (around 11/28/2020) for Routine Follow Up, w/ Dr. Burt Knack, in person.   Medication Adjustments/Labs and Tests Ordered: Current medicines are reviewed at length with the patient today.  Concerns regarding medicines are outlined above.  Tests Ordered: No orders of the defined  types were placed in this encounter.  Medication Changes: No orders of the defined types were placed in this encounter.   Signed, Richardson Dopp, PA-C  05/30/2020 2:25 PM    [facility] [place], [place], [place]  [address] Phone: [phone]; Fax: [phone]

## 2020-05-30 ENCOUNTER — Ambulatory Visit (INDEPENDENT_AMBULATORY_CARE_PROVIDER_SITE_OTHER): Payer: Medicare Other | Admitting: Physician Assistant

## 2020-05-30 ENCOUNTER — Encounter: Payer: Self-pay | Admitting: Physician Assistant

## 2020-05-30 VITALS — BP 146/90 | HR 77 | Ht 63.0 in | Wt 101.6 lb

## 2020-05-30 DIAGNOSIS — I2510 Atherosclerotic heart disease of native coronary artery without angina pectoris: Secondary | ICD-10-CM | POA: Diagnosis not present

## 2020-05-30 DIAGNOSIS — I10 Essential (primary) hypertension: Secondary | ICD-10-CM | POA: Diagnosis not present

## 2020-05-30 DIAGNOSIS — H34231 Retinal artery branch occlusion, right eye: Secondary | ICD-10-CM | POA: Diagnosis not present

## 2020-05-30 DIAGNOSIS — I6523 Occlusion and stenosis of bilateral carotid arteries: Secondary | ICD-10-CM

## 2020-05-30 LAB — BASIC METABOLIC PANEL
BUN/Creatinine Ratio: 19 (calc) (ref 6–22)
BUN: 11 mg/dL (ref 7–25)
CO2: 33 mmol/L — ABNORMAL HIGH (ref 20–32)
Calcium: 10.1 mg/dL (ref 8.6–10.4)
Chloride: 102 mmol/L (ref 98–110)
Creat: 0.59 mg/dL — ABNORMAL LOW (ref 0.60–0.88)
Glucose, Bld: 95 mg/dL (ref 65–99)
Potassium: 3.5 mmol/L (ref 3.5–5.3)
Sodium: 144 mmol/L (ref 135–146)

## 2020-05-30 LAB — CBC WITH DIFFERENTIAL/PLATELET
Absolute Monocytes: 420 cells/uL (ref 200–950)
Basophils Absolute: 18 cells/uL (ref 0–200)
Basophils Relative: 0.3 %
Eosinophils Absolute: 48 cells/uL (ref 15–500)
Eosinophils Relative: 0.8 %
HCT: 42.2 % (ref 35.0–45.0)
Hemoglobin: 14 g/dL (ref 11.7–15.5)
Lymphs Abs: 1236 cells/uL (ref 850–3900)
MCH: 31.1 pg (ref 27.0–33.0)
MCHC: 33.2 g/dL (ref 32.0–36.0)
MCV: 93.8 fL (ref 80.0–100.0)
MPV: 10.6 fL (ref 7.5–12.5)
Monocytes Relative: 7 %
Neutro Abs: 4278 cells/uL (ref 1500–7800)
Neutrophils Relative %: 71.3 %
Platelets: 181 10*3/uL (ref 140–400)
RBC: 4.5 10*6/uL (ref 3.80–5.10)
RDW: 13.4 % (ref 11.0–15.0)
Total Lymphocyte: 20.6 %
WBC: 6 10*3/uL (ref 3.8–10.8)

## 2020-05-30 LAB — TSH: TSH: 2.06 mIU/L (ref 0.40–4.50)

## 2020-05-30 NOTE — Assessment & Plan Note (Signed)
Hyperglycemia: Last A1c very good. Carotid artery disease, right branch retinal artery occlusion: Saw cardiology 02/28/2020, do not need to rule out  A. fib at the time.  Echo 03-2020: Normal EF, no source of emboli.  They recommend a follow-up carotid US 01/2021. They are considering decrease aspirin back to 81 and add Plavix.   HTN: Currently on amlodipine, losartan, metoprolol and potassium.  Ambulatory BPs are checked but she does not recall the readings.  BP was elevated today, recheck: 146/72.  No change, check a BMP Anxiety depression: On clonazepam, contract and UDS today. Weight loss: Unclear etiology, no night sweats, no lymphadenopathies or organomegaly. Last CT lung ca screening 2019: (-) Check CBC, TSH, CXR Thyroid nodule: Found per CT incidentally 10-2017, at that time she declined work-up but we agreed to proceed with ultrasound. Preventive care:  Flu shot today Had 2 Covid vaccines, rec  booster   RTC 3 months

## 2020-05-30 NOTE — Patient Instructions (Signed)
Medication Instructions:  Your physician recommends that you continue on your current medications as directed. Please refer to the Current Medication list given to you today.  *If you need a refill on your cardiac medications before your next appointment, please call your pharmacy*  Lab Work: None ordered today  Testing/Procedures: None ordered today  Follow-Up: At [facility], you and your health needs are our priority.  As part of our continuing mission to provide you with exceptional heart care, we have created designated Provider Care Teams.  These Care Teams include your primary Cardiologist (physician) and Advanced Practice Providers (APPs -  Physician Assistants and Nurse Practitioners) who all work together to provide you with the care you need, when you need it.  Your next appointment:   6 month(s)  The format for your next appointment:   In Person  Provider:   Sherren Mocha, MD  Other Instructions Watch blood pressure, call the office if the top number is 130 or higher consistently.  Two Gram Sodium Diet 2000 mg  What is Sodium? Sodium is a mineral found naturally in many foods. The most significant source of sodium in the diet is table salt, which is about 40% sodium.  Processed, convenience, and preserved foods also contain a large amount of sodium.  The body needs only 500 mg of sodium daily to function,  A normal diet provides more than enough sodium even if you do not use salt.  Why Limit Sodium? A build up of sodium in the body can cause thirst, increased blood pressure, shortness of breath, and water retention.  Decreasing sodium in the diet can reduce edema and risk of heart attack or stroke associated with high blood pressure.  Keep in mind that there are many other factors involved in these health problems.  Heredity, obesity, lack of exercise, cigarette smoking, stress and what you eat all play a role.  General Guidelines:  Do not add salt at the table or in  cooking.  One teaspoon of salt contains over 2 grams of sodium.  Read food labels  Avoid processed and convenience foods  Ask your dietitian before eating any foods not dicussed in the menu planning guidelines  Consult your physician if you wish to use a salt substitute or a sodium containing medication such as antacids.  Limit milk and milk products to 16 oz (2 cups) per day.  Shopping Hints:  READ LABELS!! "Dietetic" does not necessarily mean low sodium.  Salt and other sodium ingredients are often added to foods during processing.   Menu Planning Guidelines Food Group Choose More Often Avoid  Beverages (see also the milk group All fruit juices, low-sodium, salt-free vegetables juices, low-sodium carbonated beverages Regular vegetable or tomato juices, commercially softened water used for drinking or cooking  Breads and Cereals Enriched white, wheat, rye and pumpernickel bread, hard rolls and dinner rolls; muffins, cornbread and waffles; most dry cereals, cooked cereal without added salt; unsalted crackers and breadsticks; low sodium or homemade bread crumbs Bread, rolls and crackers with salted tops; quick breads; instant hot cereals; pancakes; commercial bread stuffing; self-rising flower and biscuit mixes; regular bread crumbs or cracker crumbs  Desserts and Sweets Desserts and sweets mad with mild should be within allowance Instant pudding mixes and cake mixes  Fats Butter or margarine; vegetable oils; unsalted salad dressings, regular salad dressings limited to 1 Tbs; light, sour and heavy cream Regular salad dressings containing bacon fat, bacon bits, and salt pork; snack dips made with instant soup  mixes or processed cheese; salted nuts  Fruits Most fresh, frozen and canned fruits Fruits processed with salt or sodium-containing ingredient (some dried fruits are processed with sodium sulfites        Vegetables Fresh, frozen vegetables and low- sodium canned vegetables Regular  canned vegetables, sauerkraut, pickled vegetables, and others prepared in brine; frozen vegetables in sauces; vegetables seasoned with ham, bacon or salt pork  Condiments, Sauces, Miscellaneous  Salt substitute with physician's approval; pepper, herbs, spices; vinegar, lemon or lime juice; hot pepper sauce; garlic powder, onion powder, low sodium soy sauce (1 Tbs.); low sodium condiments (ketchup, chili sauce, mustard) in limited amounts (1 tsp.) fresh ground horseradish; unsalted tortilla chips, pretzels, potato chips, popcorn, salsa (1/4 cup) Any seasoning made with salt including garlic salt, celery salt, onion salt, and seasoned salt; sea salt, rock salt, kosher salt; meat tenderizers; monosodium glutamate; mustard, regular soy sauce, barbecue, sauce, chili sauce, teriyaki sauce, steak sauce, Worcestershire sauce, and most flavored vinegars; canned gravy and mixes; regular condiments; salted snack foods, olives, picles, relish, horseradish sauce, catsup   Food preparation: Try these seasonings Meats:    Pork Sage, onion Serve with applesauce  Chicken Poultry seasoning, thyme, parsley Serve with cranberry sauce  Lamb Curry powder, rosemary, garlic, thyme Serve with mint sauce or jelly  Veal Marjoram, basil Serve with current jelly, cranberry sauce  Beef Pepper, bay leaf Serve with dry mustard, unsalted chive butter  Fish [place] leaf, dill Serve with unsalted lemon butter, unsalted parsley butter  Vegetables:    Asparagus Lemon juice   Broccoli Lemon juice   Carrots Mustard dressing parsley, mint, nutmeg, glazed with unsalted butter and sugar   Green beans Marjoram, lemon juice, nutmeg,dill seed   Tomatoes Basil, marjoram, onion   Spice /blend for [employer]" 4 tsp ground thyme 1 tsp ground sage 3 tsp ground rosemary 4 tsp ground marjoram   Test your knowledge 1. A product that says "Salt Free" may still contain sodium. True or False 2. Garlic Powder and Hot Pepper Sauce an be used as  alternative seasonings.True or False 3. Processed foods have more sodium than fresh foods.  True or False 4. Canned Vegetables have less sodium than froze True or False  WAYS TO DECREASE YOUR SODIUM INTAKE 1. Avoid the use of added salt in cooking and at the table.  Table salt (and other prepared seasonings which contain salt) is probably one of the greatest sources of sodium in the diet.  Unsalted foods can gain flavor from the sweet, sour, and butter taste sensations of herbs and spices.  Instead of using salt for seasoning, try the following seasonings with the foods listed.  Remember: how you use them to enhance natural food flavors is limited only by your creativity... Allspice-Meat, fish, eggs, fruit, peas, red and yellow vegetables Almond Extract-Fruit baked goods Anise Seed-Sweet breads, fruit, carrots, beets, cottage cheese, cookies (tastes like licorice) Basil-Meat, fish, eggs, vegetables, rice, vegetables salads, soups, sauces Bay Leaf-Meat, fish, stews, poultry Burnet-Salad, vegetables (cucumber-like flavor) Caraway Seed-Bread, cookies, cottage cheese, meat, vegetables, cheese, rice Cardamon-Baked goods, fruit, soups Celery Powder or seed-Salads, salad dressings, sauces, meatloaf, soup, bread.Do not use  celery salt Chervil-Meats, salads, fish, eggs, vegetables, cottage cheese (parsley-like flavor) Chili Power-Meatloaf, chicken cheese, corn, eggplant, egg dishes Chives-Salads cottage cheese, egg dishes, soups, vegetables, sauces Cilantro-Salsa, casseroles Cinnamon-Baked goods, fruit, pork, lamb, chicken, carrots Cloves-Fruit, baked goods, fish, pot roast, green beans, beets, carrots Coriander-Pastry, cookies, meat, salads, cheese (lemon-orange flavor) Cumin-Meatloaf, fish,cheese, eggs, cabbage,fruit  pie (caraway flavor) [employer], fruit, eggs, fish, poultry, cottage cheese, vegetables Dill Seed-Meat, cottage cheese, poultry, vegetables, fish, salads, bread Fennel  Seed-Bread, cookies, apples, pork, eggs, fish, beets, cabbage, cheese, Licorice-like flavor Garlic-(buds or powder) Salads, meat, poultry, fish, bread, butter, vegetables, potatoes.Do not  use garlic salt Ginger-Fruit, vegetables, baked goods, meat, fish, poultry Horseradish Root-Meet, vegetables, butter Lemon Juice or Extract-Vegetables, fruit, tea, baked goods, fish salads Mace-Baked goods fruit, vegetables, fish, poultry (taste like nutmeg) Maple Extract-Syrups Marjoram-Meat, chicken, fish, vegetables, breads, green salads (taste like Sage) Mint-Tea, lamb, sherbet, vegetables, desserts, carrots, cabbage Mustard, Dry or Seed-Cheese, eggs, meats, vegetables, poultry Nutmeg-Baked goods, fruit, chicken, eggs, vegetables, desserts Onion Powder-Meat, fish, poultry, vegetables, cheese, eggs, bread, rice salads (Do not use   Onion salt) Orange Extract-Desserts, baked goods Oregano-Pasta, eggs, cheese, onions, pork, lamb, fish, chicken, vegetables, green salads Paprika-Meat, fish, poultry, eggs, cheese, vegetables Parsley Flakes-Butter, vegetables, meat fish, poultry, eggs, bread, salads (certain forms may   Contain sodium Pepper-Meat fish, poultry, vegetables, eggs Peppermint Extract-Desserts, baked goods Poppy Seed-Eggs, bread, cheese, fruit dressings, baked goods, noodles, vegetables, cottage  [employer], poultry, meat, fish, cauliflower, turnips,eggs bread Saffron-Rice, bread, veal, chicken, fish, eggs Sage-Meat, fish, poultry, onions, eggplant, tomateos, pork, stews Savory-Eggs, salads, poultry, meat, rice, vegetables, soups, pork Tarragon-Meat, poultry, fish, eggs, butter, vegetables (licorice-like flavor)  Thyme-Meat, poultry, fish, eggs, vegetables, (clover-like flavor), sauces, soups Tumeric-Salads, butter, eggs, fish, rice, vegetables (saffron-like flavor) Vanilla Extract-Baked goods, candy Vinegar-Salads, vegetables, meat marinades Walnut  Extract-baked goods, candy  2. Choose your Foods Wisely   The following is a list of foods to avoid which are high in sodium:  Meats-Avoid all smoked, canned, salt cured, dried and kosher meat and fish as well as Anchovies   [employer] meats:Bologna, Liverwurst, Pastrami Canned meat or fish  Marinated herring Caviar    Pepperoni Corned Beef   Pizza Dried chipped beef  Salami Frozen breaded fish or meat Salt pork Frankfurters or hot dogs  Sardines Gefilte fish   Sausage Ham (boiled ham, Proscuitto Smoked butt    spiced ham)   Spam      TV Dinners Vegetables Canned vegetables (Regular) Relish Canned mushrooms  Sauerkraut Olives    Tomato juice Pickles  Bakery and Dessert Products Canned puddings  Cream pies Cheesecake   Decorated cakes Cookies  Beverages/Juices Tomato juice, regular  Gatorade   V-8 vegetable juice, regular  Breads and Cereals Biscuit mixes   Salted potato chips, corn chips, pretzels Bread stuffing mixes  Salted crackers and rolls Pancake and waffle mixes Self-rising flour  Seasonings Accent    Meat sauces Barbecue sauce  Meat tenderizer Catsup    Monosodium glutamate (MSG) Celery salt   Onion salt Chili sauce   Prepared mustard Garlic salt   Salt, seasoned salt, sea salt Gravy mixes   Soy sauce Horseradish   Steak sauce Ketchup   Tartar sauce Lite salt    Teriyaki sauce Marinade mixes   Worcestershire sauce  Others Baking powder   Cocoa and cocoa mixes Baking soda   Commercial casserole mixes Candy-caramels, chocolate  Dehydrated soups    Bars, fudge,nougats  Instant rice and pasta mixes Canned broth or soup  Maraschino cherries Cheese, aged and processed cheese and cheese spreads  Learning Assessment Quiz  Indicated T (for True) or F (for False) for each of the following statements:  1. _____ Fresh fruits and vegetables and unprocessed grains are generally low in sodium 2. _____ Water may  contain a considerable amount of  sodium, depending on the source 3. _____ You can always tell if a food is high in sodium by tasting it 4. _____ Certain laxatives my be high in sodium and should be avoided unless prescribed   by a physician or pharmacist 5. _____ Salt substitutes may be used freely by anyone on a sodium restricted diet 6. _____ Sodium is present in table salt, food additives and as a natural component of   most foods 7. _____ Table salt is approximately 90% sodium 8. _____ Limiting sodium intake may help prevent excess fluid accumulation in the body 9. _____ On a sodium-restricted diet, seasonings such as bouillon soy sauce, and    cooking wine should be used in place of table salt 10. _____ On an ingredient list, a product which lists monosodium glutamate as the first   ingredient is an appropriate food to include on a low sodium diet  Circle the best answer(s) to the following statements (Hint: there may be more than one correct answer)  11. On a low-sodium diet, some acceptable snack items are:    A. Olives  F. Bean dip   K. Grapefruit juice    B. Salted Pretzels G. Commercial Popcorn   L. Canned peaches    C. Carrot Sticks  H. Bouillon   M. Unsalted nuts   D. [place] fries  I. Peanut butter crackers N. Salami   E. Sweet pickles J. Tomato Juice   O. Pizza  12.  Seasonings that may be used freely on a reduced - sodium diet include   A. Lemon wedges F.Monosodium glutamate K. Celery seed    B.Soysauce   G. Pepper   L. Mustard powder   C. Sea salt  H. Cooking wine  M. Onion flakes   D. Vinegar  E. Prepared horseradish N. Salsa   E. Sage   J. Worcestershire sauce  O. Chutney

## 2020-05-31 ENCOUNTER — Telehealth: Payer: Self-pay | Admitting: Internal Medicine

## 2020-05-31 NOTE — Telephone Encounter (Signed)
Please advise 

## 2020-05-31 NOTE — Telephone Encounter (Signed)
Patient states she did not understand the x-ray results message.

## 2020-06-01 LAB — DRUG TOX MONITOR 1 W/CONF, ORAL FLD

## 2020-06-01 NOTE — Telephone Encounter (Signed)
Send comments via MyChart

## 2020-06-11 ENCOUNTER — Telehealth: Payer: Self-pay | Admitting: Pharmacist

## 2020-06-11 NOTE — Progress Notes (Addendum)
Chronic Care Management Pharmacy Assistant   Name: Patricia [name]  MRN: [id number] DOB: September 17, 1940  Reason for Encounter: Initial Questions   PCP : Colon Branch, MD  Allergies:   Allergies  Allergen Reactions  . Ace Inhibitors     cough    Medications: Outpatient Encounter Medications as of 06/11/2020  Medication Sig  . amLODipine (NORVASC) 10 MG tablet Take 1 tablet (10 mg total) by mouth daily.  Marland Kitchen aspirin 325 MG tablet Take 325 mg by mouth daily.  Marland Kitchen atorvastatin (LIPITOR) 80 MG tablet Take 1 tablet (80 mg total) by mouth at bedtime.  . budesonide-formoterol (SYMBICORT) 160-4.5 MCG/ACT inhaler Inhale 2 puffs into the lungs 2 (two) times daily.  . Cholecalciferol (VITAMIN D3) 1000 UNITS CAPS Take 1,000 Units by mouth daily.  . clonazePAM (KLONOPIN) 0.5 MG tablet TAKE 1 TABLET BY MOUTH IN THE MORNING AS NEEDED FOR ANXIETY AND 1 & 1/2 TO 2 (ONE & ONE-HALF TO TWO) AT BEDTIME AS NEEDED FOR SLEEP  . ezetimibe (ZETIA) 10 MG tablet Take 1 tablet (10 mg total) by mouth daily.  Marland Kitchen FLUoxetine (PROZAC) 20 MG capsule Take 2 capsules (40 mg total) by mouth daily.  Marland Kitchen losartan (COZAAR) 100 MG tablet Take 1 tablet by mouth once daily  . metoprolol succinate (TOPROL XL) 25 MG 24 hr tablet Take 1 tablet (25 mg total) by mouth daily.  . potassium chloride SA (KLOR-CON) 20 MEQ tablet Take 10 mEq by mouth daily.   No facility-administered encounter medications on file as of 06/11/2020.    Current Diagnosis: Patient Active Problem List   Diagnosis Date Noted  . Branch retinal artery occlusion   . Branch retinal artery occlusion, right eye 01/23/2020  . 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 Port-A-Cath in place 09/26/2019  . Anal cancer ([place]) 09/14/2019  . Constipation   . Rectal bleeding   . Rectal lesion 09/06/2019  . CAP (community acquired pneumonia)  05/15/2016  . Right shoulder pain 12/26/2015  . PCP NOTES >>>>>>>>>>>>>>> 12/13/2015  . Retinal vein occlusion--Left dx 02-2015 03/20/2015  . Recurrent UTI 06/27/2014  . Hyperglycemia 01/25/2014  . 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 Annual physical exam 08/08/2011  . PAD (peripheral artery disease) ([place]) 07/21/2011  . OTHER DYSPNEA AND RESPIRATORY ABNORMALITIES 07/02/2010  . Coronary atherosclerosis of native coronary artery 07/02/2010  . Allergic rhinitis 10/16/2009  . Essential hypertension 07/20/2009  . Anxiety,depression, insomnia 05/18/2009  . SKIN LESION 01/12/2008  . 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 H/O tobacco use, presenting hazards to health 04/26/2007  . Hyperlipidemia 01/25/2007  . Carotid artery disease ([place]) 01/25/2007  . COPD (chronic obstructive pulmonary disease) ([place]) 01/25/2007  . OSTEOPENIA 01/25/2007    Goals Addressed   None     Have you seen any other providers since your last visit? No. Last saw cardiologist on 05-30-2020.   Any changes in your medications or health? Unsure   Any side effects from any medications? No   Do you have an symptoms or problems not managed by your medications? No   Any concerns about your health right now? None   Has your provider asked that you check blood pressure, blood sugar, or follow special diet at home? Patient has been asked to check her blood pressure at home regularly and decrease her salt intake   Do you get any  type of exercise on a regular basis? No   Can you think of a goal you would like to reach for your health? None   Do you have any problems getting your medications? No patient receives her medication with [facility]. Per medicare advantage list, this pharmacy is preferred.   Is there anything that you would like to discuss during the appointment? No  Reminded the patient to please have medications and supplements availble at the time of her appointment  Tuesday November 2nd at 2:00pm over the telephone.   Follow-Up:  Pharmacist Review   Fanny Skates, [place] Pharmacist Assistant [phone]  Reviewed by: De Blanch, PharmD Clinical Pharmacist [facility] [phone]

## 2020-06-12 ENCOUNTER — Other Ambulatory Visit: Payer: Self-pay

## 2020-06-12 ENCOUNTER — Ambulatory Visit: Payer: Medicare Other | Admitting: Pharmacist

## 2020-06-12 DIAGNOSIS — J449 Chronic obstructive pulmonary disease, unspecified: Secondary | ICD-10-CM

## 2020-06-12 DIAGNOSIS — I10 Essential (primary) hypertension: Secondary | ICD-10-CM

## 2020-06-12 DIAGNOSIS — R739 Hyperglycemia, unspecified: Secondary | ICD-10-CM

## 2020-06-12 DIAGNOSIS — M949 Disorder of cartilage, unspecified: Secondary | ICD-10-CM

## 2020-06-12 DIAGNOSIS — E785 Hyperlipidemia, unspecified: Secondary | ICD-10-CM

## 2020-06-12 DIAGNOSIS — M899 Disorder of bone, unspecified: Secondary | ICD-10-CM

## 2020-06-12 NOTE — Chronic Care Management (AMB) (Signed)
Chronic Care Management Pharmacy  Name: Patricia [name]  MRN: [id number] DOB: April 13, 1941  Chief Complaint/ HPI  Patricia [name],  79 y.o. , female presents for their Initial CCM visit with the clinical pharmacist via telephone due to COVID-19 Pandemic.  PCP : Colon Branch, MD  Their chronic conditions include: Hypertension, Hyperlipidemia/CAD, Pre-Diabetes, Depression, Osteopenia, COPD  Office Visits: 05/29/2020: Visit w/ Dr. Larose Kells - Flu shot given. No med changes noted.   Consult Visit: 05/30/20: Cardio visit w/ Richardson Dopp, PA-C - Continue aspirin and atorvastatin. If BP remains >130/80 consider increasing metoprolol to 26m daily.  02/28/20: Cardio visit w/ SRichardson Dopp PA-C - Not recommending event monitor for Afib, recommend echo. Noting ophthalmology increased aspirin to 3293m Ok for now, consider decreasing to aspirin 8174mnd adding clopidogrel. Review with Dr. CooBurt Knack determine whether to send patient to vascular surgery for further eval of carotid arteries. Discussed the importance of aggressive blood pressure and cholesterol management. If BP remains elevated consider beta blocker therapy. LDL goal <70, ideally <55, add ezetimibe 3m60mily. Obtain LFTs in 3 months. RTC 3 months.   Medications: Outpatient Encounter Medications as of 06/12/2020  Medication Sig  . amLODipine (NORVASC) 10 MG tablet Take 1 tablet (10 mg total) by mouth daily.  . asMarland Kitchenirin 325 MG tablet Take 325 mg by mouth daily.  . atMarland Kitchenrvastatin (LIPITOR) 80 MG tablet Take 1 tablet (80 mg total) by mouth at bedtime.  . budesonide-formoterol (SYMBICORT) 160-4.5 MCG/ACT inhaler Inhale 2 puffs into the lungs 2 (two) times daily.  . Cholecalciferol (VITAMIN D3) 1000 UNITS CAPS Take 1,000 Units by mouth daily.  . clonazePAM (KLONOPIN) 0.5 MG tablet TAKE 1 TABLET BY MOUTH IN THE MORNING AS NEEDED FOR ANXIETY AND 1 & 1/2 TO 2 (ONE & ONE-HALF TO TWO) AT BEDTIME AS NEEDED FOR SLEEP  . ezetimibe (ZETIA) 10 MG tablet Take 1  tablet (10 mg total) by mouth daily.  . FLMarland Kitchenoxetine (PROZAC) 20 MG capsule Take 2 capsules (40 mg total) by mouth daily.  . loMarland Kitchenartan (COZAAR) 100 MG tablet Take 1 tablet by mouth once daily  . metoprolol succinate (TOPROL XL) 25 MG 24 hr tablet Take 1 tablet (25 mg total) by mouth daily.  . potassium chloride SA (KLOR-CON) 20 MEQ tablet Take 20 mEq by mouth daily.    No facility-administered encounter medications on file as of 06/12/2020.   SDOH Screenings   Alcohol Screen:   . Last Alcohol Screening Score (AUDIT): Not on file  Depression (PHQ2-9): Low Risk   . PHQ-2 Score: 0  Financial Resource Strain: Medium Risk  . Difficulty of Paying Living Expenses: Somewhat hard  Food Insecurity: No Food Insecurity  . Worried About RunnCharity fundraiserthe Last Year: Never true  . Ran Out of Food in the Last Year: Never true  Housing:   . Last Housing Risk Score: Not on file  Physical Activity:   . Days of Exercise per Week: Not on file  . Minutes of Exercise per Session: Not on file  Social Connections:   . Frequency of Communication with Friends and Family: Not on file  . Frequency of Social Gatherings with Friends and Family: Not on file  . Attends Religious Services: Not on file  . Active Member of Clubs or Organizations: Not on file  . Attends ClubArchivisttings: Not on file  . Marital Status: Not on file  Stress:   . Feeling of Stress : Not on file  Tobacco Use: Medium Risk  . Smoking Tobacco Use: Former Smoker  . Smokeless Tobacco Use: Never Used  Transportation Needs: No Transportation Needs  . Lack of Transportation (Medical): No  . Lack of Transportation (Non-Medical): No    Current Diagnosis/Assessment:  Goals Addressed            This Visit's Progress   . Chronic Care Management Pharmacy Care Plan       CARE PLAN ENTRY (see longitudinal plan of care for additional care plan information)  Current Barriers:  . Chronic Disease Management support,  education, and care coordination needs related to Hypertension, Hyperlipidemia/CAD, Pre-Diabetes, Depression, Osteopenia, COPD   Hypertension BP Readings from Last 3 Encounters:  05/30/20 (!) 146/90  05/29/20 (!) 146/72  02/28/20 130/60   . Pharmacist Clinical Goal(s): o Over the next 90 days, patient will work with PharmD and providers to achieve BP goal <130/80 . Current regimen:  . Losartan 140m daily . Amlodipine 1105mdaily noon . Metoprolol Succinate 2551maily . Interventions: o Requested patient to check BP 2-3 times per week o Discussed BP goal  . Patient self care activities - Over the next 90 days, patient will: o Check BP 2-3 times per week, document, and provide at future appointments o Ensure daily salt intake < 2300 mg/[name]  Hyperlipidemia Lab Results  Component Value Date/Time   LDLCALC 76 01/25/2020 11:27 AM   LDLDIRECT 156.0 04/26/2007 09:36 AM   . Pharmacist Clinical Goal(s): o Over the next 90 days, patient will work with PharmD and providers to achieve LDL goal < 70 . Current regimen:  . Aspirin 325m51mily  . Atorvastatin 80mg65mly . Ezetimibe 10mg 26my . Interventions: o Discussed LDL goal o Repeat lipid panel . Patient self care activities - Over the next 90 days, patient will: o Maintain cholesterol medication regimen.  o Repeat lipid panel  Pre-Diabetes Lab Results  Component Value Date/Time   HGBA1C 5.7 01/25/2020 11:27 AM   HGBA1C 5.4 07/06/2019 02:10 PM   . Pharmacist Clinical Goal(s): o Over the next 90 days, patient will work with PharmD and providers to maintain A1c goal <6.5% . Current regimen:  o Diet and exercise management   . Patient self care activities - Over the next 90 days, patient will: o Maintain a1c <6.5%  Osteopenia . Pharmacist Clinical Goal(s) o Over the next 90 days, patient will work with PharmD and providers to reduce risk of fracture due to osteopenia . Current regimen:  o Vitamin D 1000 units  daily . Interventions: o Discussed intake of 1200mg o20mlcium daily through diet and/or supplementation o Discussed intake of [phone] units of vitamin D through supplementation  o Consider repeat DEXA Scan . Patient self care activities - Over the next 90 days, patient will: o Consider intake of 1200mg of51mcium daily through diet and/or supplementation o Consider intake of [phone] units of vitamin D through supplementation o Repeat DEXA Scan  COPD . Pharmacist Clinical Goal(s) o Over the next 90 days, patient will work with PharmD and providers to reduce symptoms associated with COPD . Current regimen:  o Symbicort 160-4.5 2 puffs twice daily . Interventions: o Consider completing patient assistance application for Symbicort . Patient self care activities - Over the next 90 days, patient will: o Maintain medication regimen for COPD o Further explore possibility of patient assistance for COPD  Medication management . Pharmacist Clinical Goal(s): o Over the next 90 days, patient will work with PharmD and providers to maintain  optimal medication adherence . Current pharmacy: [facility]  . Interventions o Comprehensive medication review performed. o Continue current medication management strategy . Patient self care activities - Over the next 90 days, patient will: o Focus on medication adherence by filling and taking medications appropriately  o Take medications as prescribed o Report any questions or concerns to PharmD and/or provider(s)  Initial goal documentation       Social Hx:  Lives with grandson, wife, and her  [place] grand baby ([place]) Retired in 2003. Worked for [employer] 24 years. Typical Shaquayla Klimas: Home alone, doesn't have many activities at home. Has stopped driving due to vision issues. Son committed suicide in 9s or 10 years ago? He was buried on her grandson's birthday  Hypertension   BP goal is:  <130/80  Office blood  pressures are  BP Readings from Last 3 Encounters:  05/30/20 (!) 146/90  05/29/20 (!) 146/72  02/28/20 130/60   Patient checks BP at home 1-2x per week Patient home BP readings are ranging: Unable to assess  Patient has failed these meds in the past: ACEI (cough) Patient is currently uncontrolled on the following medications:  . Losartan 114m daily . Amlodipine 125mdaily noon . Metoprolol Succinate 2547maily  Denies headaches, chest pains, dizziness  We discussed BP goal  Plan -Check blood pressure 2-3 times per week and record -Continue current medications     Hyperlipidemia/CAD   LDL goal < 70  Last lipids Lab Results  Component Value Date   CHOL 149 01/25/2020   HDL 51.00 01/25/2020   LDLCALC 76 01/25/2020   LDLDIRECT 156.0 04/26/2007   TRIG 110.0 01/25/2020   CHOLHDL 3 01/25/2020   Hepatic Function Latest Ref Rng & Units 10/24/2019 10/05/2019 09/26/2019  Total Protein 6.5 - 8.1 g/dL 6.1(L) 6.5 7.0  Albumin 3.5 - 5.0 g/dL 2.8(L) 3.2(L) 3.6  AST 15 - 41 U/L _0 ALT 0 - 44 U/L _1 Alk Phosphatase 38 - 126 U/L 115 137(H) 156(H)  Total Bilirubin 0.3 - 1.2 mg/dL 0.3 0.3 0.3  Bilirubin, Direct 0.0 - 0.3 mg/dL - - -     The ASCVD Risk score (GoMikey Bussing Jr., et al., 2013) failed to calculate for the following reasons:   The 2013 ASCVD risk score is only valid for ages 40 42 79 32Patient has failed these meds in past: simvastatin (inefficacy) Patient is currently uncontrolled on the following medications:  . Aspirin 325m75mily (recommended to continue per ophthalmology to protect perfusion distally in the right eye) . Atorvastatin 80mg43mly . Ezetimibe 10mg 9my  Pt would benefit from updated lipid panel  We discussed:  LDL goal  Plan -Complete lipid panel at next office visit -Continue current medications    Pre-Diabetes   A1c goal <6.5%  Recent Relevant Labs: Lab Results  Component Value Date/Time   HGBA1C 5.7 01/25/2020 11:27 AM    HGBA1C 5.4 07/06/2019 02:10 PM   GFR 113.49 01/25/2020 11:27 AM   GFR 111.18 07/06/2019 02:10 PM    Last diabetic Eye exam: No results found for: HMDIABEYEEXA  Last diabetic Foot exam: No results found for: HMDIABFOOTEX   Patient has failed these meds in past: None noted  Patient is currently controlled on the following medications: . None  Plan -Continue current medications   Depression   Depression screen PHQ 2/[facility]/17/2021 01/25/2020 07/06/2019  Decreased Interest 0 1 1  Down, Depressed, Hopeless 0 1 1  PHQ - 2 Score 0 2 2  Altered sleeping - 0 0  Tired, decreased energy - 1 1  Change in appetite - 0 0  Feeling bad or failure about yourself  - 0 0  Trouble concentrating - 0 0  Moving slowly or fidgety/restless - 0 0  Suicidal thoughts - 0 0  PHQ-9 Score - 3 3  Difficult doing work/chores - Not difficult at all Not difficult at all  Some recent data might be hidden    Patient has failed these meds in past: None noted  Patient is currently controlled on the following medications:   Clonazepam 0.69m #1AM and #1.5-2 HS (only takes 1 at bedtime)  Fluoxetine 268m#2 daily  Started when her son died Worries about her grandson because she knows he is suffering too   Plan -Continue current medications   Future Plan -Assess PHQ9 at next visit    Osteopenia    Last DEXA Scan: 11/28/12  T-Score femoral neck: -1.7  Vit D, 25-Hydroxy  Date Value Ref Range Status  08/13/2010 32 30 - 89 ng/mL Final    Comment:    See lab report for associated comment(s)     Patient is not a candidate for pharmacologic treatment  Patient has failed these meds in past: None noted  Patient is currently controlled on the following medications:  . Marland Kitchenitamin D 1000 units daily  We discussed:  Recommend [phone] units of vitamin D daily. Recommend 1200 mg of calcium daily from dietary and supplemental sources.  Plan -Consider intake of calcium 120036maily (could consider vitamin D and  calcium combo) -Consider repeat DEXA Scan  COPD / Tobacco   Last spirometry score: Specific readings not noted  Eosinophil count:   Lab Results  Component Value Date/Time   EOSPCT 0.8 05/29/2020 01:50 PM  %                               Eos (Absolute):  Lab Results  Component Value Date/Time   EOSABS 48 05/29/2020 01:50 PM    Tobacco Status:  Social History   Tobacco Use  Smoking Status Former Smoker  . Packs/Kyndall Amero: 0.50  . Years: 54.00  . Pack years: 27.00  . Types: Cigarettes  . Quit date: 12/15/2013  . 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 No (She can't recall last time she used) Frequency of rescue inhaler use:  infrequently (does not have one prescribed?)  Feels SOB with exertion Pt implies cost of medication prevents her from using daily.  Discussed possibility of PAP   Plan -Continue current medications  -Consider applying for Symbicort PAP   Vaccines   Reviewed and discussed patient's vaccination history.    Immunization History  Administered Date(s) Administered  . Fluad Quad(high Dose 65+) 07/06/2019, 05/29/2020  . Influenza Split 07/24/2011, 04/30/2012  . Influenza Whole 06/14/2007, 06/08/2008, 05/18/2009, 05/09/2010  . Influenza, High Dose Seasonal PF 04/11/2013, 05/13/2016, 06/24/2017, 05/03/2018  . Influenza,inj,Quad PF,6+ Mos 05/09/2014, 05/28/2015  . PFIZER SARS-COV-2 Vaccination 10/29/2019, 11/26/2019  . Pneumococcal Conjugate-13 04/06/2014  . Pneumococcal Polysaccharide-23 08/11/2005, 07/18/2010  . Td 01/25/2007  . Tdap 10/07/2016    Plan -Recommended patient receive Shingles vaccine in  pharmacy. Pt declines  De Blanch, PharmD Clinical Pharmacist [facility] at  [facility] [phone]

## 2020-06-18 ENCOUNTER — Other Ambulatory Visit: Payer: Self-pay

## 2020-06-18 DIAGNOSIS — J449 Chronic obstructive pulmonary disease, unspecified: Secondary | ICD-10-CM

## 2020-06-18 DIAGNOSIS — I10 Essential (primary) hypertension: Secondary | ICD-10-CM

## 2020-06-18 DIAGNOSIS — E785 Hyperlipidemia, unspecified: Secondary | ICD-10-CM

## 2020-06-18 DIAGNOSIS — F32A Depression, unspecified: Secondary | ICD-10-CM

## 2020-06-18 DIAGNOSIS — F419 Anxiety disorder, unspecified: Secondary | ICD-10-CM

## 2020-06-23 NOTE — Patient Instructions (Addendum)
Visit Information  Goals Addressed            This Visit's Progress   . Chronic Care Management Pharmacy Care Plan       CARE PLAN ENTRY (see longitudinal plan of care for additional care plan information)  Current Barriers:  . Chronic Disease Management support, education, and care coordination needs related to Hypertension, Hyperlipidemia/CAD, Pre-Diabetes, Depression, Osteopenia, COPD   Hypertension BP Readings from Last 3 Encounters:  05/30/20 (!) 146/90  05/29/20 (!) 146/72  02/28/20 130/60   . Pharmacist Clinical Goal(s): o Over the next 90 days, patient will work with PharmD and providers to achieve BP goal <130/80 . Current regimen:  . Losartan 100mg  daily . Amlodipine 10mg  daily noon . Metoprolol Succinate 25mg  daily . Interventions: o Requested patient to check BP 2-3 times per week o Discussed BP goal  . Patient self care activities - Over the next 90 days, patient will: o Check BP 2-3 times per week, document, and provide at future appointments o Ensure daily salt intake < 2300 mg/Avary Pitsenbarger  Hyperlipidemia Lab Results  Component Value Date/Time   LDLCALC 76 01/25/2020 11:27 AM   LDLDIRECT 156.0 04/26/2007 09:36 AM   . Pharmacist Clinical Goal(s): o Over the next 90 days, patient will work with PharmD and providers to achieve LDL goal < 70 . Current regimen:  . Aspirin 325mg  daily  . Atorvastatin 80mg  daily . Ezetimibe 10mg  daily . Interventions: o Discussed LDL goal o Repeat lipid panel . Patient self care activities - Over the next 90 days, patient will: o Maintain cholesterol medication regimen.  o Repeat lipid panel  Pre-Diabetes Lab Results  Component Value Date/Time   HGBA1C 5.7 01/25/2020 11:27 AM   HGBA1C 5.4 07/06/2019 02:10 PM   . Pharmacist Clinical Goal(s): o Over the next 90 days, patient will work with PharmD and providers to maintain A1c goal <6.5% . Current regimen:  o Diet and exercise management   . Patient self care activities -  Over the next 90 days, patient will: o Maintain a1c <6.5%  Osteopenia . Pharmacist Clinical Goal(s) o Over the next 90 days, patient will work with PharmD and providers to reduce risk of fracture due to osteopenia . Current regimen:  o Vitamin D 1000 units daily . Interventions: o Discussed intake of 1200mg  of calcium daily through diet and/or supplementation o Discussed intake of [phone] units of vitamin D through supplementation  o Consider repeat DEXA Scan . Patient self care activities - Over the next 90 days, patient will: o Consider intake of 1200mg  of calcium daily through diet and/or supplementation o Consider intake of [phone] units of vitamin D through supplementation o Repeat DEXA Scan  COPD . Pharmacist Clinical Goal(s) o Over the next 90 days, patient will work with PharmD and providers to reduce symptoms associated with COPD . Current regimen:  o Symbicort 160-4.5 2 puffs twice daily . Interventions: o Consider completing patient assistance application for Symbicort . Patient self care activities - Over the next 90 days, patient will: o Maintain medication regimen for COPD o Further explore possibility of patient assistance for COPD  Medication management . Pharmacist Clinical Goal(s): o Over the next 90 days, patient will work with PharmD and providers to maintain optimal medication adherence . Current pharmacy: [facility]  . Interventions o Comprehensive medication review performed. o Continue current medication management strategy . Patient self care activities - Over the next 90 days, patient will: o Focus on medication adherence by filling  and taking medications appropriately  o Take medications as prescribed o Report any questions or concerns to PharmD and/or provider(s)  Initial goal documentation        Patricia [name] was given information about Chronic Care Management services today including:  1. CCM service includes personalized  support from designated clinical staff supervised by her physician, including individualized plan of care and coordination with other care providers 2. 24/7 contact phone numbers for assistance for urgent and routine care needs. 3. Standard insurance, coinsurance, copays and deductibles apply for chronic care management only during months in which we provide at least 20 minutes of these services. Most insurances cover these services at 100%, however patients may be responsible for any copay, coinsurance and/or deductible if applicable. This service may help you avoid the need for more expensive face-to-face services. 4. Only one practitioner may furnish and bill the service in a calendar month. 5. The patient may stop CCM services at any time (effective at the end of the month) by phone call to the office staff.  Patient agreed to services and verbal consent obtained.   The patient verbalized understanding of instructions, educational materials, and care plan provided today and agreed to receive a mailed copy of patient instructions, educational materials, and care plan.  Telephone follow up appointment with pharmacy team member scheduled for: 08/31/2020  Patricia [name], PharmD Clinical Pharmacist [facility] [phone]   How to Take Your Blood Pressure You can take your blood pressure at home with a machine. You may need to check your blood pressure at home: To check if you have high blood pressure (hypertension). To check your blood pressure over time. To make sure your blood pressure medicine is working. Supplies needed: You will need a blood pressure machine, or monitor. You can buy one at a drugstore or online. When choosing one: Choose one with an arm cuff. Choose one that wraps around your upper arm. Only one finger should fit between your arm and the cuff. Do not choose one that measures your blood pressure from your wrist or finger. Your doctor can  suggest a monitor. How to prepare Avoid these things for 30 minutes before checking your blood pressure: Drinking caffeine. Drinking alcohol. Eating. Smoking. Exercising. Five minutes before checking your blood pressure: Pee. Sit in a dining chair. Avoid sitting in a soft couch or armchair. Be quiet. Do not talk. How to take your blood pressure Follow the instructions that came with your machine. If you have a digital blood pressure monitor, these may be the instructions: Sit up straight. Place your feet on the floor. Do not cross your ankles or legs. Rest your left arm at the level of your heart. You may rest it on a table, desk, or chair. Pull up your shirt sleeve. Wrap the blood pressure cuff around the upper part of your left arm. The cuff should be 1 inch (2.5 cm) above your elbow. It is best to wrap the cuff around bare skin. Fit the cuff snugly around your arm. You should be able to place only one finger between the cuff and your arm. Put the cord inside the groove of your elbow. Press the power button. Sit quietly while the cuff fills with air and loses air. Write down the numbers on the screen. Wait 2-3 minutes and then repeat steps 1-10. What do the numbers mean? Two numbers make up your blood pressure. The first number is called systolic pressure. The second is  called diastolic pressure. An example of a blood pressure reading is "120 over 80" (or 120/80). If you are an adult and do not have a medical condition, use this guide to find out if your blood pressure is normal: Normal First number: below 120. Second number: below 80. Elevated First number: 120-129. Second number: below 80. Hypertension stage 1 First number: 130-139. Second number: 80-89. Hypertension stage 2 First number: 140 or above. Second number: 77 or above. Your blood pressure is above normal even if only the top or bottom number is above normal. Follow these instructions at home: Check your blood  pressure as often as your doctor tells you to. Take your monitor to your next doctor's appointment. Your doctor will: Make sure you are using it correctly. Make sure it is working right. Make sure you understand what your blood pressure numbers should be. Tell your doctor if your medicines are causing side effects. Contact a doctor if: Your blood pressure keeps being high. Get help right away if: Your first blood pressure number is higher than 180. Your second blood pressure number is higher than 120. This information is not intended to replace advice given to you by your health care provider. Make sure you discuss any questions you have with your health care provider. Document Revised: 07/10/2017 Document Reviewed: 01/04/2016 Elsevier Patient Education  2020 [employer].

## 2020-07-06 ENCOUNTER — Emergency Department (HOSPITAL_BASED_OUTPATIENT_CLINIC_OR_DEPARTMENT_OTHER): Payer: Medicare Other

## 2020-07-06 ENCOUNTER — Inpatient Hospital Stay (HOSPITAL_BASED_OUTPATIENT_CLINIC_OR_DEPARTMENT_OTHER)
Admission: EM | Admit: 2020-07-06 | Discharge: 2020-07-10 | DRG: 291 | Disposition: A | Discharge status: Home / Self Care | Payer: Medicare Other | Attending: Internal Medicine | Admitting: Internal Medicine

## 2020-07-06 DIAGNOSIS — J90 Pleural effusion, not elsewhere classified: Secondary | ICD-10-CM | POA: Diagnosis not present

## 2020-07-06 DIAGNOSIS — J9811 Atelectasis: Secondary | ICD-10-CM | POA: Diagnosis present

## 2020-07-06 DIAGNOSIS — J439 Emphysema, unspecified: Secondary | ICD-10-CM | POA: Diagnosis not present

## 2020-07-06 DIAGNOSIS — I5031 Acute diastolic (congestive) heart failure: Secondary | ICD-10-CM

## 2020-07-06 DIAGNOSIS — Z8249 Family history of ischemic heart disease and other diseases of the circulatory system: Secondary | ICD-10-CM

## 2020-07-06 DIAGNOSIS — R0789 Other chest pain: Secondary | ICD-10-CM | POA: Diagnosis not present

## 2020-07-06 DIAGNOSIS — Z66 Do not resuscitate: Secondary | ICD-10-CM | POA: Diagnosis not present

## 2020-07-06 DIAGNOSIS — D649 Anemia, unspecified: Secondary | ICD-10-CM | POA: Diagnosis not present

## 2020-07-06 DIAGNOSIS — R079 Chest pain, unspecified: Secondary | ICD-10-CM | POA: Diagnosis not present

## 2020-07-06 DIAGNOSIS — I110 Hypertensive heart disease with heart failure: Principal | ICD-10-CM | POA: Diagnosis present

## 2020-07-06 DIAGNOSIS — I509 Heart failure, unspecified: Secondary | ICD-10-CM | POA: Diagnosis not present

## 2020-07-06 DIAGNOSIS — E876 Hypokalemia: Secondary | ICD-10-CM | POA: Diagnosis not present

## 2020-07-06 DIAGNOSIS — J80 Acute respiratory distress syndrome: Secondary | ICD-10-CM | POA: Diagnosis not present

## 2020-07-06 DIAGNOSIS — Z79899 Other long term (current) drug therapy: Secondary | ICD-10-CM

## 2020-07-06 DIAGNOSIS — Z85048 Personal history of other malignant neoplasm of rectum, rectosigmoid junction, and anus: Secondary | ICD-10-CM

## 2020-07-06 DIAGNOSIS — R778 Other specified abnormalities of plasma proteins: Secondary | ICD-10-CM

## 2020-07-06 DIAGNOSIS — F419 Anxiety disorder, unspecified: Secondary | ICD-10-CM | POA: Diagnosis present

## 2020-07-06 DIAGNOSIS — Z7982 Long term (current) use of aspirin: Secondary | ICD-10-CM

## 2020-07-06 DIAGNOSIS — Z20822 Contact with and (suspected) exposure to covid-19: Secondary | ICD-10-CM | POA: Diagnosis present

## 2020-07-06 DIAGNOSIS — E041 Nontoxic single thyroid nodule: Secondary | ICD-10-CM | POA: Diagnosis not present

## 2020-07-06 DIAGNOSIS — Z955 Presence of coronary angioplasty implant and graft: Secondary | ICD-10-CM

## 2020-07-06 DIAGNOSIS — H34231 Retinal artery branch occlusion, right eye: Secondary | ICD-10-CM | POA: Diagnosis present

## 2020-07-06 DIAGNOSIS — F32A Depression, unspecified: Secondary | ICD-10-CM | POA: Diagnosis present

## 2020-07-06 DIAGNOSIS — R0902 Hypoxemia: Secondary | ICD-10-CM | POA: Diagnosis not present

## 2020-07-06 DIAGNOSIS — I4581 Long QT syndrome: Secondary | ICD-10-CM | POA: Diagnosis present

## 2020-07-06 DIAGNOSIS — J449 Chronic obstructive pulmonary disease, unspecified: Secondary | ICD-10-CM | POA: Diagnosis present

## 2020-07-06 DIAGNOSIS — Z9221 Personal history of antineoplastic chemotherapy: Secondary | ICD-10-CM

## 2020-07-06 DIAGNOSIS — I472 Ventricular tachycardia: Secondary | ICD-10-CM | POA: Diagnosis present

## 2020-07-06 DIAGNOSIS — I6523 Occlusion and stenosis of bilateral carotid arteries: Secondary | ICD-10-CM | POA: Diagnosis present

## 2020-07-06 DIAGNOSIS — I5033 Acute on chronic diastolic (congestive) heart failure: Secondary | ICD-10-CM | POA: Diagnosis present

## 2020-07-06 DIAGNOSIS — Z87891 Personal history of nicotine dependence: Secondary | ICD-10-CM

## 2020-07-06 DIAGNOSIS — R0602 Shortness of breath: Secondary | ICD-10-CM | POA: Diagnosis not present

## 2020-07-06 DIAGNOSIS — Z823 Family history of stroke: Secondary | ICD-10-CM

## 2020-07-06 DIAGNOSIS — D696 Thrombocytopenia, unspecified: Secondary | ICD-10-CM | POA: Diagnosis present

## 2020-07-06 DIAGNOSIS — J9601 Acute respiratory failure with hypoxia: Secondary | ICD-10-CM | POA: Diagnosis not present

## 2020-07-06 DIAGNOSIS — Z951 Presence of aortocoronary bypass graft: Secondary | ICD-10-CM

## 2020-07-06 DIAGNOSIS — Z7951 Long term (current) use of inhaled steroids: Secondary | ICD-10-CM

## 2020-07-06 DIAGNOSIS — Z923 Personal history of irradiation: Secondary | ICD-10-CM

## 2020-07-06 DIAGNOSIS — I517 Cardiomegaly: Secondary | ICD-10-CM | POA: Diagnosis not present

## 2020-07-06 DIAGNOSIS — I2510 Atherosclerotic heart disease of native coronary artery without angina pectoris: Secondary | ICD-10-CM | POA: Diagnosis present

## 2020-07-06 DIAGNOSIS — I160 Hypertensive urgency: Secondary | ICD-10-CM | POA: Diagnosis present

## 2020-07-06 DIAGNOSIS — I248 Other forms of acute ischemic heart disease: Secondary | ICD-10-CM | POA: Diagnosis present

## 2020-07-06 DIAGNOSIS — I7092 Chronic total occlusion of artery of the extremities: Secondary | ICD-10-CM | POA: Diagnosis present

## 2020-07-06 DIAGNOSIS — I4729 Other ventricular tachycardia: Secondary | ICD-10-CM

## 2020-07-06 DIAGNOSIS — M8580 Other specified disorders of bone density and structure, unspecified site: Secondary | ICD-10-CM | POA: Diagnosis present

## 2020-07-06 DIAGNOSIS — Z888 Allergy status to other drugs, medicaments and biological substances status: Secondary | ICD-10-CM

## 2020-07-06 DIAGNOSIS — I10 Essential (primary) hypertension: Secondary | ICD-10-CM | POA: Diagnosis present

## 2020-07-06 DIAGNOSIS — E785 Hyperlipidemia, unspecified: Secondary | ICD-10-CM | POA: Diagnosis present

## 2020-07-06 DIAGNOSIS — I70209 Unspecified atherosclerosis of native arteries of extremities, unspecified extremity: Secondary | ICD-10-CM | POA: Diagnosis present

## 2020-07-06 NOTE — Progress Notes (Signed)
Patient arrived at ED on 100% non re-breather at 10 liters with a SPO2 at 100%.  Removed patient from 100 % and non-rebreather and placed patient on 6 liter nasal cannula and her SPO2 remained at 100%.  Weaned patient to 4 liter nasal cannula and her SPO2 remains at 100%.  Patient weaned to 2 liters on her nasal cannula and her SPO2 is between 100% and 98%.  RT will continue to monitor.

## 2020-07-06 NOTE — ED Provider Notes (Signed)
[facility] EMERGENCY DEPARTMENT Provider Note   CSN: [id number] Arrival date & time: 07/06/20  2235   History Chief Complaint  Patient presents with  . Shortness of Breath    Patricia [name] is a 79 y.o. female.  The history is provided by the patient.  Shortness of Breath She has history of hypertension, hyperlipidemia, coronary artery disease status post coronary bypass, COPD and had onset this evening of shortness of breath as well as a heavy feeling and pressure feeling in her chest.  Nothing made it better, nothing made it worse.  There is no associated nausea or diaphoresis.  She denies any cough.  She took her normal medications at bedtime which included aspirin 325 mg.  EMS arrived and put her on oxygen and she felt better immediately.  Of note, she is vaccinated against COVID-19.  Past Medical History:  Diagnosis Date  . Abnormal LFTs   . Allergic rhinitis   . Anxiety and depression    son commited suicide 2010  . 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 Branch retinal artery occlusion    Echocardiogram 8/21:  EF 59, no RWMA, mild LVH, Gr 1 DD, normal RVSF, mild MR [no intracardiac source of embolism]  . CAD (coronary artery disease)    s/p CABG  . Cancer [facility])    anal  . Carotid artery disease ([place])    Moderate - f/u dopplers needed 11/2012  . 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 Zadie Rhine  . COPD (chronic obstructive pulmonary disease) ([place])   . HTN (hypertension)    intolerant to ACEi (cough)  . Hyperlipidemia   . Osteopenia   . PAD (peripheral artery disease) ([place])    Carotid dz as  below, and at [facility] 11/12:  critical stenosis noted just below the sheath site leading into a totally occluded SFA and a patent deep femoral artery  . Panic attacks   . Thyroid nodule     Patient Active Problem List   Diagnosis Date Noted  . Branch retinal artery occlusion   . Branch retinal artery occlusion, right  eye 01/23/2020  . 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 Port-A-Cath in place 09/26/2019  . Anal cancer ([place]) 09/14/2019  . Constipation   . Rectal bleeding   . Rectal lesion 09/06/2019  . CAP (community acquired pneumonia) 05/15/2016  . Right shoulder pain 12/26/2015  . PCP NOTES >>>>>>>>>>>>>>> 12/13/2015  . Retinal vein occlusion--Left dx 02-2015 03/20/2015  . Recurrent UTI 06/27/2014  . Hyperglycemia 01/25/2014  . 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 Annual physical exam 08/08/2011  . PAD (peripheral artery disease) ([place]) 07/21/2011  . OTHER DYSPNEA AND RESPIRATORY ABNORMALITIES 07/02/2010  . Coronary atherosclerosis of native coronary artery 07/02/2010  . Allergic rhinitis 10/16/2009  . Essential hypertension 07/20/2009  . Anxiety,depression, insomnia 05/18/2009  . SKIN LESION 01/12/2008  . 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 H/O tobacco use, presenting hazards to health 04/26/2007  . Hyperlipidemia 01/25/2007  . Carotid artery disease ([place]) 01/25/2007  . COPD (chronic obstructive pulmonary disease) ([place]) 01/25/2007  . Disorder of bone and cartilage 01/25/2007    Past Surgical History:  Procedure Laterality Date  . BIOPSY  09/07/2019   Procedure: BIOPSY;  Surgeon: Jackquline Denmark, MD;  Location: [facility] ENDOSCOPY;  Service:  Gastroenterology;;  . CORONARY ANGIOPLASTY WITH STENT PLACEMENT     "1; makes total of 3" (07/05/2012)  . CORONARY ARTERY BYPASS GRAFT N/A 12/15/2013   Procedure: CORONARY ARTERY BYPASS GRAFTING (CABG);  Surgeon: Gaye Pollack, MD;  Location: [facility];  Service: Open Heart Surgery;  Laterality: N/A;  Times 3 using left internal mammary artery and endoscopically harvested right saphenous vein   . FLEXIBLE SIGMOIDOSCOPY N/A 09/07/2019   Procedure: FLEXIBLE SIGMOIDOSCOPY;  Surgeon: Jackquline Denmark, MD;  Location: [facility];  Service: Gastroenterology;  Laterality: N/A;  . INTRAOPERATIVE TRANSESOPHAGEAL ECHOCARDIOGRAM N/A 12/15/2013   Procedure: INTRAOPERATIVE TRANSESOPHAGEAL ECHOCARDIOGRAM;  Surgeon: Gaye Pollack, MD;  Location: [facility] OR;  Service: Open Heart Surgery;  Laterality: N/A;  . 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 LEFT HEART CATHETERIZATION WITH CORONARY ANGIOGRAM N/A 07/02/2011   Procedure: LEFT HEART CATHETERIZATION WITH CORONARY ANGIOGRAM;  Surgeon: Sherren Mocha, MD;  Location: [facility] CATH LAB;  Service: Cardiovascular;  Laterality: N/A;  . LEFT HEART CATHETERIZATION WITH CORONARY ANGIOGRAM N/A 07/05/2012   Procedure: LEFT HEART CATHETERIZATION WITH CORONARY ANGIOGRAM;  Surgeon: Sherren Mocha, MD;  Location: [facility] CATH LAB;  Service: Cardiovascular;  Laterality: N/A;  . LEFT HEART CATHETERIZATION WITH CORONARY ANGIOGRAM N/A 11/28/2013   Procedure: LEFT HEART CATHETERIZATION WITH CORONARY ANGIOGRAM;  Surgeon: Blane Ohara, MD;  Location: [facility] CATH LAB;  Service: Cardiovascular;  Laterality: N/A;  . PERCUTANEOUS CORONARY INTERVENTION-BALLOON ONLY  07/02/2011   Procedure: PERCUTANEOUS CORONARY INTERVENTION-BALLOON ONLY;  Surgeon: Sherren Mocha, MD;  Location: [facility] CATH LAB;  Service: Cardiovascular;;  . PERCUTANEOUS CORONARY INTERVENTION-BALLOON ONLY  07/05/2012   Procedure: PERCUTANEOUS CORONARY INTERVENTION-BALLOON ONLY;  Surgeon: Sherren Mocha, MD;  Location: [facility] CATH LAB;  Service: Cardiovascular;;  . VAGINAL HYSTERECTOMY  ~ 1976   no oophorectomy     OB History   No obstetric history on file.     Family History  Problem Relation Age of Onset  . Hypertension Mother   . Stroke Mother   . Coronary artery disease Father        CAD in female relative <50  . Heart attack Father   . Heart attack Brother 91  . Heart Problems Sister 49       perminant pacemaker  . Suicidality Son 53       2010    . Healthy Grandchild   . Colon cancer Neg Hx   . Breast cancer Neg Hx   . Stomach cancer Neg Hx   . Rectal cancer Neg Hx   . Esophageal cancer Neg Hx   . Liver cancer Neg Hx     Social History   Tobacco Use  . Smoking status: Former Smoker    Packs/day: 0.50    Years: 54.00    Pack years: 27.00    Types: Cigarettes    Quit date: 12/15/2013    Years since quitting: 6.5  . Smokeless tobacco: Never Used  . Tobacco comment: quit tobacco 12-2013 after CABG  Vaping Use  . Vaping Use: Never used  Substance Use Topics  . Alcohol use: Yes  . Drug use: No    Home Medications Prior to Admission medications   Medication Sig Start Date End Date Taking? Authorizing Provider  amLODipine (NORVASC) 10 MG tablet Take 1 tablet (10 mg total) by mouth daily. 08/22/19   Colon Branch, MD  aspirin 325 MG tablet Take  325 mg by mouth daily.    [provider]  atorvastatin (LIPITOR) 80 MG tablet Take 1 tablet (80 mg total) by mouth at bedtime. 01/31/20   Colon Branch, MD  budesonide-formoterol [facility]) 160-4.5 MCG/ACT inhaler Inhale 2 puffs into the lungs 2 (two) times daily. 06/04/17   Colon Branch, MD  Cholecalciferol (VITAMIN D3) 1000 UNITS CAPS Take 1,000 Units by mouth daily.    [provider]  clonazePAM (KLONOPIN) 0.5 MG tablet TAKE 1 TABLET BY MOUTH IN THE MORNING AS NEEDED FOR ANXIETY AND 1 & 1/2 TO 2 (ONE & ONE-HALF TO TWO) AT BEDTIME AS NEEDED FOR SLEEP 05/25/20   Colon Branch, MD  ezetimibe (ZETIA) 10 MG tablet Take 1 tablet (10 mg total) by mouth daily. 02/28/20   Richardson Dopp T, PA-C  FLUoxetine (PROZAC) 20 MG capsule Take 2 capsules (40 mg total) by mouth daily. 08/01/19   Colon Branch, MD  losartan (COZAAR) 100 MG tablet Take 1 tablet by mouth once daily 05/31/19   Sherren Mocha, MD  metoprolol succinate (TOPROL XL) 25 MG 24 hr tablet Take 1 tablet (25 mg total) by mouth daily. 03/20/20   Richardson Dopp T, PA-C  potassium chloride SA (KLOR-CON) 20 MEQ tablet Take 20 mEq  by mouth daily.     [provider]    Allergies    Ace inhibitors  Review of Systems   Review of Systems  Respiratory: Positive for shortness of breath.   All other systems reviewed and are negative.   Physical Exam Updated Vital Signs BP (!) 200/75 (BP Location: Right Arm)   Pulse 70   Temp (!) 97.5 F (36.4 C) (Oral)   Resp (!) 24   Ht 5\' 2"  (1.575 m)   Wt 49.2 kg   SpO2 97% Comment: 5lpm via [place]  BMI 19.84 kg/m   Physical Exam Vitals and nursing note reviewed.   79 year old female, resting comfortably and in no acute distress. Vital signs are significant for elevated blood pressure and elevated respiratory rate. Oxygen saturation is 97%, which is normal. Head is normocephalic and atraumatic. PERRLA, EOMI. Oropharynx is clear. Neck is nontender and supple without adenopathy or JVD. Back is nontender and there is no CVA tenderness. Lungs have bibasilar rales going about halfway up without wheezes or rhonchi. Chest is nontender. Heart has regular rate and rhythm without murmur. Abdomen is soft, flat, nontender without masses or hepatosplenomegaly and peristalsis is normoactive. Extremities have no cyanosis or edema, full range of motion is present. Skin is warm and dry without rash. Neurologic: Mental status is normal, cranial nerves are intact, there are no motor or sensory deficits.  ED Results / Procedures / Treatments   Labs (all labs ordered are listed, but only abnormal results are displayed) Labs Reviewed - No data to display  EKG EKG Interpretation  Date/Time:  Friday July 06 2020 22:51:20 EST Ventricular Rate:  68 PR Interval:    QRS Duration: 98 QT Interval:  472 QTC Calculation: 502 R Axis:   36 Text Interpretation: Sinus rhythm Atrial premature complex LVH with secondary repolarization abnormality Prolonged QT interval Baseline wander in lead(s) V3 When compared with ECG of 12/16/2013, QT has lengthened Confirmed by Delora Fuel ([id number])  on 07/06/2020 11:34:45 PM   Radiology DG Chest Portable 1 View  Result Date: 07/06/2020 CLINICAL DATA:  Acute shortness of breath, COPD. EXAM: PORTABLE CHEST 1 VIEW COMPARISON:  Chest x-ray 05/29/2020, CT chest 10/16/2017 FINDINGS: The heart size  and mediastinal contours are unchanged. Marked atherosclerotic plaque of the aorta. Cardiac surgical changes. Coarsened interstitial markings flattening of the hemidiaphragms consistent with emphysema. Biapical pleural/pulmonary scarring again noted. Interval development of a retrocardiac opacity and possible trace left pleural effusion. Nonspecific blunting of the right costophrenic angle. No pulmonary edema. No pneumothorax. No acute osseous abnormality. IMPRESSION: 1. Interval development of a retrocardiac opacity and possible trace left pleural effusion. Findings could be infectious or inflammatory. 2. Aortic Atherosclerosis (ICD10-I70.0) and Emphysema (ICD10-J43.9). Electronically Signed   By: Iven Finn M.D.   On: 07/06/2020 23:29    Procedures Procedures  Medications Ordered in ED Medications - No data to display  ED Course  I have reviewed the triage vital signs and the nursing notes.  Pertinent labs & imaging results that were available during my care of the patient were reviewed by me and considered in my medical decision making (see chart for details).  MDM Rules/Calculators/A&P Dyspnea which seems likely to be CHF exacerbation, possible COPD exacerbation.  No cough or fever to suggest pneumonia.  Also, she would be at risk for pulmonary embolism, will screen for this with D-dimer.  Chest x-ray suggests a retrocardiac infiltrate, but I am not convinced of this on reviewing the images.  ECG shows LVH with repolarization changes, not significantly changed from prior.  Records are reviewed, and she has no ED visits for COPD or CHF.  Echocardiogram March 15, 2020 did show grade 1 diastolic dysfunction.  1:15 AM BNP is come back  moderately elevated, and I think this is the cause of her dyspnea. D-dimer is also mildly elevated, will send for CT angiogram of the chest. Troponin is mildly elevated and I suspect that this is demand ischemia and not a non-STEMI. Repeat troponin is pending. She is given a dose of furosemide.  2:40 AM CT angiogram shows no evidence of pulmonary embolism.  She got significantly hypoxic just getting up to a bedside commode, will need to be admitted.  Case is discussed with Dr. Tonie Griffith of [facility], who agrees to admit the patient.  Patricia [name] was evaluated in Emergency Department on 07/06/2020 for the symptoms described in the history of present illness. She was evaluated in the context of the global COVID-19 pandemic, which necessitated consideration that the patient might be at risk for infection with the SARS-CoV-2 virus that causes COVID-19. Institutional protocols and algorithms that pertain to the evaluation of patients at risk for COVID-19 are in a state of rapid change based on information released by regulatory bodies including the CDC and federal and state organizations. These policies and algorithms were followed during the patient's care in the ED.   Final Clinical Impression(s) / ED Diagnoses Final diagnoses:  Acute diastolic heart failure (HCC)  Elevated troponin  Normochromic normocytic anemia  Thrombocytopenia ([place])    Rx / DC Orders ED Discharge Orders    None       Delora Fuel, MD 56/38/75 [phone]

## 2020-07-06 NOTE — ED Triage Notes (Signed)
States that suddenly she began having shortness of breath, EMS was called. Pt states she felt much better after oxygen mask was applied (arrived with NRB on)

## 2020-07-07 ENCOUNTER — Encounter (HOSPITAL_BASED_OUTPATIENT_CLINIC_OR_DEPARTMENT_OTHER): Payer: Self-pay

## 2020-07-07 ENCOUNTER — Emergency Department (HOSPITAL_BASED_OUTPATIENT_CLINIC_OR_DEPARTMENT_OTHER): Payer: Medicare Other

## 2020-07-07 ENCOUNTER — Other Ambulatory Visit: Payer: Self-pay

## 2020-07-07 DIAGNOSIS — H34231 Retinal artery branch occlusion, right eye: Secondary | ICD-10-CM | POA: Diagnosis present

## 2020-07-07 DIAGNOSIS — I160 Hypertensive urgency: Secondary | ICD-10-CM | POA: Diagnosis present

## 2020-07-07 DIAGNOSIS — E041 Nontoxic single thyroid nodule: Secondary | ICD-10-CM | POA: Diagnosis present

## 2020-07-07 DIAGNOSIS — I6523 Occlusion and stenosis of bilateral carotid arteries: Secondary | ICD-10-CM | POA: Diagnosis present

## 2020-07-07 DIAGNOSIS — R0602 Shortness of breath: Secondary | ICD-10-CM | POA: Diagnosis present

## 2020-07-07 DIAGNOSIS — I248 Other forms of acute ischemic heart disease: Secondary | ICD-10-CM | POA: Diagnosis present

## 2020-07-07 DIAGNOSIS — D649 Anemia, unspecified: Secondary | ICD-10-CM | POA: Diagnosis present

## 2020-07-07 DIAGNOSIS — M8580 Other specified disorders of bone density and structure, unspecified site: Secondary | ICD-10-CM | POA: Diagnosis present

## 2020-07-07 DIAGNOSIS — J9601 Acute respiratory failure with hypoxia: Secondary | ICD-10-CM | POA: Diagnosis present

## 2020-07-07 DIAGNOSIS — I110 Hypertensive heart disease with heart failure: Secondary | ICD-10-CM | POA: Diagnosis present

## 2020-07-07 DIAGNOSIS — I5033 Acute on chronic diastolic (congestive) heart failure: Secondary | ICD-10-CM | POA: Insufficient documentation

## 2020-07-07 DIAGNOSIS — F32A Depression, unspecified: Secondary | ICD-10-CM | POA: Diagnosis present

## 2020-07-07 DIAGNOSIS — I517 Cardiomegaly: Secondary | ICD-10-CM | POA: Diagnosis not present

## 2020-07-07 DIAGNOSIS — I7092 Chronic total occlusion of artery of the extremities: Secondary | ICD-10-CM | POA: Diagnosis present

## 2020-07-07 DIAGNOSIS — J90 Pleural effusion, not elsewhere classified: Secondary | ICD-10-CM | POA: Diagnosis not present

## 2020-07-07 DIAGNOSIS — J9811 Atelectasis: Secondary | ICD-10-CM | POA: Diagnosis present

## 2020-07-07 DIAGNOSIS — F419 Anxiety disorder, unspecified: Secondary | ICD-10-CM | POA: Diagnosis present

## 2020-07-07 DIAGNOSIS — I70209 Unspecified atherosclerosis of native arteries of extremities, unspecified extremity: Secondary | ICD-10-CM | POA: Diagnosis present

## 2020-07-07 DIAGNOSIS — E785 Hyperlipidemia, unspecified: Secondary | ICD-10-CM | POA: Diagnosis present

## 2020-07-07 DIAGNOSIS — I472 Ventricular tachycardia: Secondary | ICD-10-CM | POA: Diagnosis present

## 2020-07-07 DIAGNOSIS — Z66 Do not resuscitate: Secondary | ICD-10-CM | POA: Diagnosis not present

## 2020-07-07 DIAGNOSIS — J449 Chronic obstructive pulmonary disease, unspecified: Secondary | ICD-10-CM | POA: Diagnosis present

## 2020-07-07 DIAGNOSIS — Z20822 Contact with and (suspected) exposure to covid-19: Secondary | ICD-10-CM | POA: Diagnosis present

## 2020-07-07 DIAGNOSIS — D696 Thrombocytopenia, unspecified: Secondary | ICD-10-CM | POA: Diagnosis present

## 2020-07-07 DIAGNOSIS — E876 Hypokalemia: Secondary | ICD-10-CM | POA: Diagnosis not present

## 2020-07-07 DIAGNOSIS — I509 Heart failure, unspecified: Secondary | ICD-10-CM | POA: Diagnosis not present

## 2020-07-07 DIAGNOSIS — I2510 Atherosclerotic heart disease of native coronary artery without angina pectoris: Secondary | ICD-10-CM | POA: Diagnosis present

## 2020-07-07 LAB — CBC WITH DIFFERENTIAL/PLATELET
Abs Immature Granulocytes: 0.01 10*3/uL (ref 0.00–0.07)
Basophils Absolute: 0 10*3/uL (ref 0.0–0.1)
Basophils Relative: 0 %
Eosinophils Absolute: 0.1 10*3/uL (ref 0.0–0.5)
Eosinophils Relative: 2 %
HCT: 36.4 % (ref 36.0–46.0)
Hemoglobin: 11.7 g/dL — ABNORMAL LOW (ref 12.0–15.0)
Immature Granulocytes: 0 %
Lymphocytes Relative: 16 %
Lymphs Abs: 0.8 10*3/uL (ref 0.7–4.0)
MCH: 31.1 pg (ref 26.0–34.0)
MCHC: 32.1 g/dL (ref 30.0–36.0)
MCV: 96.8 fL (ref 80.0–100.0)
Monocytes Absolute: 0.4 10*3/uL (ref 0.1–1.0)
Monocytes Relative: 7 %
Neutro Abs: 3.6 10*3/uL (ref 1.7–7.7)
Neutrophils Relative %: 75 %
Platelets: 147 10*3/uL — ABNORMAL LOW (ref 150–400)
RBC: 3.76 MIL/uL — ABNORMAL LOW (ref 3.87–5.11)
RDW: 13.3 % (ref 11.5–15.5)
WBC: 4.8 10*3/uL (ref 4.0–10.5)
nRBC: 0 % (ref 0.0–0.2)

## 2020-07-07 LAB — CBC
HCT: 41 % (ref 36.0–46.0)
Hemoglobin: 13 g/dL (ref 12.0–15.0)
MCH: 30.3 pg (ref 26.0–34.0)
MCHC: 31.7 g/dL (ref 30.0–36.0)
MCV: 95.6 fL (ref 80.0–100.0)
Platelets: 155 10*3/uL (ref 150–400)
RBC: 4.29 MIL/uL (ref 3.87–5.11)
RDW: 13.1 % (ref 11.5–15.5)
WBC: 8.4 10*3/uL (ref 4.0–10.5)
nRBC: 0 % (ref 0.0–0.2)

## 2020-07-07 LAB — COMPREHENSIVE METABOLIC PANEL
ALT: 17 U/L (ref 0–44)
AST: 23 U/L (ref 15–41)
Albumin: 3.3 g/dL — ABNORMAL LOW (ref 3.5–5.0)
Alkaline Phosphatase: 127 U/L — ABNORMAL HIGH (ref 38–126)
Anion gap: 10 (ref 5–15)
BUN: 5 mg/dL — ABNORMAL LOW (ref 8–23)
CO2: 30 mmol/L (ref 22–32)
Calcium: 9.6 mg/dL (ref 8.9–10.3)
Chloride: 102 mmol/L (ref 98–111)
Creatinine, Ser: 0.54 mg/dL (ref 0.44–1.00)
GFR, Estimated: >60 mL/min (ref >60)
Glucose, Bld: 101 mg/dL — ABNORMAL HIGH (ref 70–99)
Potassium: 3.7 mmol/L (ref 3.5–5.1)
Sodium: 142 mmol/L (ref 135–145)
Total Bilirubin: 0.7 mg/dL (ref 0.3–1.2)
Total Protein: 6 g/dL — ABNORMAL LOW (ref 6.5–8.1)

## 2020-07-07 LAB — CREATININE, SERUM
Creatinine, Ser: 0.62 mg/dL (ref 0.44–1.00)
GFR, Estimated: >60 mL/min (ref >60)

## 2020-07-07 LAB — D-DIMER, QUANTITATIVE: D-Dimer, Quant: 0.58 ug/mL-FEU — ABNORMAL HIGH (ref 0.00–0.50)

## 2020-07-07 LAB — RESP PANEL BY RT-PCR (FLU A&B, COVID) ARPGX2
Influenza A by PCR: NEGATIVE
Influenza B by PCR: NEGATIVE
SARS Coronavirus 2 by RT PCR: NEGATIVE

## 2020-07-07 LAB — TROPONIN I (HIGH SENSITIVITY)
Troponin I (High Sensitivity): 30 ng/L — ABNORMAL HIGH (ref <18)
Troponin I (High Sensitivity): 41 ng/L — ABNORMAL HIGH (ref <18)

## 2020-07-07 LAB — BASIC METABOLIC PANEL
Anion gap: 6 (ref 5–15)
BUN: 10 mg/dL (ref 8–23)
CO2: 31 mmol/L (ref 22–32)
Calcium: 8.8 mg/dL — ABNORMAL LOW (ref 8.9–10.3)
Chloride: 106 mmol/L (ref 98–111)
Creatinine, Ser: 0.61 mg/dL (ref 0.44–1.00)
GFR, Estimated: >60 mL/min (ref >60)
Glucose, Bld: 122 mg/dL — ABNORMAL HIGH (ref 70–99)
Potassium: 3.8 mmol/L (ref 3.5–5.1)
Sodium: 143 mmol/L (ref 135–145)

## 2020-07-07 LAB — MAGNESIUM: Magnesium: 2 mg/dL (ref 1.7–2.4)

## 2020-07-07 LAB — BRAIN NATRIURETIC PEPTIDE: B Natriuretic Peptide: 865.9 pg/mL — ABNORMAL HIGH (ref 0.0–100.0)

## 2020-07-07 MED ORDER — SODIUM CHLORIDE 0.9% FLUSH: 3.0000 mL | INTRAVENOUS | Status: DC | PRN

## 2020-07-07 MED ORDER — LOSARTAN POTASSIUM 50 MG PO TABS: 100.0000 mg | ORAL_TABLET | Freq: Every day | ORAL | Status: DC

## 2020-07-07 MED ORDER — SODIUM CHLORIDE 0.9 % IV SOLN: 250.0000 mL | INTRAVENOUS | Status: DC | PRN

## 2020-07-07 MED ORDER — SODIUM CHLORIDE 0.9% FLUSH
3.0000 mL | Freq: Two times a day (BID) | INTRAVENOUS | Status: DC
  Administered 2020-07-07 – 2020-07-10 (×6): 3 mL via INTRAVENOUS

## 2020-07-07 MED ORDER — EZETIMIBE 10 MG PO TABS
10.0000 mg | ORAL_TABLET | Freq: Every day | ORAL | Status: DC
  Administered 2020-07-07 – 2020-07-10 (×4): 10 mg via ORAL
  Filled 2020-07-07 (×4): qty 1

## 2020-07-07 MED ORDER — ONDANSETRON HCL 4 MG/2ML IJ SOLN: 4.0000 mg | Freq: Four times a day (QID) | INTRAMUSCULAR | Status: DC | PRN

## 2020-07-07 MED ORDER — LOSARTAN POTASSIUM 50 MG PO TABS
100.0000 mg | ORAL_TABLET | Freq: Every day | ORAL | Status: DC
  Administered 2020-07-07 – 2020-07-10 (×4): 100 mg via ORAL
  Filled 2020-07-07 (×4): qty 2

## 2020-07-07 MED ORDER — METOPROLOL SUCCINATE ER 25 MG PO TB24
25.0000 mg | ORAL_TABLET | Freq: Every day | ORAL | Status: DC
  Administered 2020-07-08 – 2020-07-10 (×3): 25 mg via ORAL
  Filled 2020-07-07 (×3): qty 1

## 2020-07-07 MED ORDER — FUROSEMIDE 10 MG/ML IJ SOLN
40.0000 mg | Freq: Once | INTRAMUSCULAR | Status: AC
  Administered 2020-07-07: 40 mg via INTRAVENOUS
  Filled 2020-07-07: qty 4

## 2020-07-07 MED ORDER — ATORVASTATIN CALCIUM 80 MG PO TABS
80.0000 mg | ORAL_TABLET | Freq: Every day | ORAL | Status: DC
  Administered 2020-07-07 – 2020-07-09 (×3): 80 mg via ORAL
  Filled 2020-07-07 (×3): qty 1

## 2020-07-07 MED ORDER — ASPIRIN 325 MG PO TABS
325.0000 mg | ORAL_TABLET | Freq: Every day | ORAL | Status: DC
  Administered 2020-07-07 – 2020-07-09 (×3): 325 mg via ORAL
  Filled 2020-07-07 (×3): qty 1

## 2020-07-07 MED ORDER — IOHEXOL 350 MG/ML SOLN
100.0000 mL | Freq: Once | INTRAVENOUS | Status: AC | PRN
  Administered 2020-07-07: 100 mL via INTRAVENOUS

## 2020-07-07 MED ORDER — ACETAMINOPHEN 325 MG PO TABS: 650.0000 mg | ORAL_TABLET | ORAL | Status: DC | PRN

## 2020-07-07 MED ORDER — FLUTICASONE FUROATE-VILANTEROL 200-25 MCG/INH IN AEPB
1.0000 | INHALATION_SPRAY | Freq: Every day | RESPIRATORY_TRACT | Status: DC
  Administered 2020-07-08: 1 via RESPIRATORY_TRACT
  Filled 2020-07-07: qty 28

## 2020-07-07 MED ORDER — HEPARIN SODIUM (PORCINE) 5000 UNIT/ML IJ SOLN
5000.0000 [IU] | Freq: Three times a day (TID) | INTRAMUSCULAR | Status: DC
  Administered 2020-07-07 – 2020-07-10 (×6): 5000 [IU] via SUBCUTANEOUS
  Filled 2020-07-07 (×7): qty 1

## 2020-07-07 MED ORDER — LABETALOL HCL 5 MG/ML IV SOLN
20.0000 mg | Freq: Once | INTRAVENOUS | Status: AC
  Administered 2020-07-07: 20 mg via INTRAVENOUS
  Filled 2020-07-07: qty 4

## 2020-07-07 MED ORDER — FLUOXETINE HCL 20 MG PO CAPS
40.0000 mg | ORAL_CAPSULE | Freq: Every day | ORAL | Status: DC
  Administered 2020-07-07 – 2020-07-10 (×3): 40 mg via ORAL
  Filled 2020-07-07 (×4): qty 2

## 2020-07-07 NOTE — ED Notes (Signed)
Attempted to call report, Nurse unable to take report at this time, will call back in 15 minutes.

## 2020-07-07 NOTE — ED Notes (Signed)
Patient A & O X 4, comfortable in bed, no complaints at this time, updated on delay in admission.

## 2020-07-07 NOTE — ED Notes (Signed)
Patient offered food, denies being hungry at this time, encouraged to use call light if that changes.

## 2020-07-07 NOTE — Plan of Care (Signed)
  Problem: Activity: Goal: Capacity to carry out activities will improve Outcome: Progressing   Problem: Coping: Goal: Level of anxiety will decrease Outcome: Progressing   

## 2020-07-07 NOTE — H&P (Addendum)
History and Physical    Patricia [name] IRW:[id number] DOB: Mar 10, 1941 DOA: 07/06/2020  PCP: Colon Branch, MD    [facility] from:  [facility].    Chief Complaint:  CHF   HPI: GERARDO [name] is a 79 y.o. female with medical history significant of heart disease and CABG and copd with Chest pressure. Pt is complaint with all her meds.  Pt had echo on 03/15/2020 and showed diastolic dysfunction. CTA showed no PE at [facility]. Pt received 40 mg iv lasix x 1 . Pt reports ongoing DOE since past few months intermittently last night all of sudden she was very sob and went to er. PT lives with grandson and his wife. She reports that she cooks food with salt but does no ADD salt.    ED Course:  Vitals:   07/07/20 1312 07/07/20 1314 07/07/20 1404 07/07/20 1536  BP:  (!) 174/57  (!) 167/56  Pulse:  63  (!) 55  Resp:  18    Temp:  (!) 97.5 F (36.4 C)    TempSrc:  Oral    SpO2:  97% 97% 99%  Weight: 44.7 kg     Height: 5\' 2"  (1.575 m)       07/07/20 1545  Creatinine, serum  Collected: 07/07/20 1504  Final result  Specimen: Blood   Creatinine, Ser 0.62 mg/dL GFR, Estimated >60 mL/min        07/07/20 1529  CBC  Collected: 07/07/20 1504  Final result  Specimen: Blood   WBC 8.4 K/uL MCH 30.3 pg  RBC 4.29 MIL/uL MCHC 31.7 g/dL  Hemoglobin 13.0 g/dL RDW 13.1 %  HCT 41.0 % Platelets 155 K/uL  MCV 95.6 fL nRBC 0.0 %        07/07/20 0301  Troponin I (High Sensitivity)  Collected: 07/07/20 0218  Final result   Troponin I (High Sensitivity) 41High ng/L          07/07/20 0043  Troponin I (High Sensitivity)  Collected: 07/06/20 2358  Final result   Troponin I (High Sensitivity) 30High ng/L          07/07/20 0043  Brain natriuretic peptide  Collected: 07/06/20 2358  Final result  Specimen: Blood from Vein   B Natriuretic Peptide 865.9High pg/mL          07/07/20 0036  D-dimer, quantitative  Collected: 07/06/20 2358  Final result  Specimen: Blood from Vein    D-Dimer, Quant 0.58High ug/mL-FEU          07/07/20 7619  Basic metabolic panel  Collected: 07/06/20 2358  Final result  Specimen: Blood from Vein   Sodium 143 mmol/L BUN 10 mg/dL  Potassium 3.8 mmol/L Creatinine, Ser 0.61 mg/dL  Chloride 106 mmol/L Calcium 8.8Low mg/dL  CO2 31 mmol/L GFR, Estimated >60 mL/min   Glucose, Bld 122High mg/dL  Anion gap 6        07/07/20 0031  Magnesium  Collected: 07/06/20 2358  Final result  Specimen: Blood from Vein   Magnesium 2.0 mg/dL          07/07/20 0011  CBC with Differential  Collected: 07/06/20 2358  Final result  Specimen: Blood from Vein   WBC 4.8 K/uL Neutro Abs 3.6 K/uL  RBC 3.76Low MIL/uL Lymphocytes Relative 16 %  Hemoglobin 11.7Low g/dL Lymphs Abs 0.8 K/uL  HCT 36.4 % Monocytes Relative 7 %  MCV 96.8 fL Monocytes Absolute 0.4 K/uL  MCH 31.1 pg Eosinophils Relative 2 %  MCHC 32.1  g/dL Eosinophils Absolute 0.1 K/uL  RDW 13.3 % Basophils Relative 0 %  Platelets 147Low K/uL Basophils Absolute 0.0 K/uL  nRBC 0.0 % Immature Granulocytes 0 %  Neutrophils Relative % 75 % Abs Immature Granulocytes 0.01 K/uL        Review of Systems:  Review of Systems  Respiratory: Positive for shortness of breath.        DOE   All other systems reviewed and are negative.    Past Medical History:  Diagnosis Date  . Abnormal LFTs   . Allergic rhinitis   . Anxiety and depression    son commited suicide 2010  . 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 Branch retinal artery occlusion    Echocardiogram 8/21:  EF 59, no RWMA, mild LVH, Gr 1 DD, normal RVSF, mild MR [no intracardiac source of embolism]  . CAD (coronary artery disease)    s/p CABG  . Cancer [facility])    anal  . Carotid artery disease ([place])    Moderate - f/u dopplers needed 11/2012  . 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 Zadie Rhine  . COPD (chronic obstructive pulmonary disease) ([place])   . HTN (hypertension)     intolerant to ACEi (cough)  . Hyperlipidemia   . Osteopenia   . PAD (peripheral artery disease) ([place])    Carotid dz as  below, and at [facility] 11/12:  critical stenosis noted just below the sheath site leading into a totally occluded SFA and a patent deep femoral artery  . Panic attacks   . Thyroid nodule     Past Surgical History:  Procedure Laterality Date  . BIOPSY  09/07/2019   Procedure: BIOPSY;  Surgeon: Jackquline Denmark, MD;  Location: [facility];  Service: Gastroenterology;;  . CORONARY ANGIOPLASTY WITH STENT PLACEMENT     "1; makes total of 3" (07/05/2012)  . CORONARY ARTERY BYPASS GRAFT N/A 12/15/2013   Procedure: CORONARY ARTERY BYPASS GRAFTING (CABG);  Surgeon: Gaye Pollack, MD;  Location: [place];  Service: Open Heart Surgery;  Laterality: N/A;  Times 3 using left internal mammary artery and endoscopically harvested right saphenous vein   . FLEXIBLE SIGMOIDOSCOPY N/A 09/07/2019   Procedure: FLEXIBLE SIGMOIDOSCOPY;  Surgeon: Jackquline Denmark, MD;  Location: [facility];  Service: Gastroenterology;  Laterality: N/A;  . INTRAOPERATIVE TRANSESOPHAGEAL ECHOCARDIOGRAM N/A 12/15/2013   Procedure: INTRAOPERATIVE TRANSESOPHAGEAL ECHOCARDIOGRAM;  Surgeon: Gaye Pollack, MD;  Location: [place] OR;  Service: Open Heart Surgery;  Laterality: N/A;  . 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 LEFT HEART CATHETERIZATION WITH CORONARY ANGIOGRAM N/A 07/02/2011   Procedure: LEFT HEART CATHETERIZATION WITH CORONARY ANGIOGRAM;  Surgeon: Sherren Mocha, MD;  Location: [facility] CATH LAB;  Service: Cardiovascular;  Laterality: N/A;  . LEFT HEART CATHETERIZATION WITH CORONARY ANGIOGRAM N/A 07/05/2012   Procedure: LEFT HEART CATHETERIZATION WITH CORONARY ANGIOGRAM;  Surgeon: Sherren Mocha, MD;  Location: [facility] CATH LAB;  Service: Cardiovascular;  Laterality: N/A;  . LEFT HEART CATHETERIZATION WITH CORONARY ANGIOGRAM N/A 11/28/2013    Procedure: LEFT HEART CATHETERIZATION WITH CORONARY ANGIOGRAM;  Surgeon: Blane Ohara, MD;  Location: [facility] CATH LAB;  Service: Cardiovascular;  Laterality: N/A;  . PERCUTANEOUS CORONARY INTERVENTION-BALLOON ONLY  07/02/2011   Procedure: PERCUTANEOUS CORONARY INTERVENTION-BALLOON ONLY;  Surgeon: Sherren Mocha, MD;  Location: [facility] CATH LAB;  Service: Cardiovascular;;  .  PERCUTANEOUS CORONARY INTERVENTION-BALLOON ONLY  07/05/2012   Procedure: PERCUTANEOUS CORONARY INTERVENTION-BALLOON ONLY;  Surgeon: Sherren Mocha, MD;  Location: [facility] CATH LAB;  Service: Cardiovascular;;  . VAGINAL HYSTERECTOMY  ~ 1976   no oophorectomy     reports that she quit smoking about 6 years ago. Her smoking use included cigarettes. She has a 27.00 pack-year smoking history. She has never used smokeless tobacco. She reports current alcohol use. She reports that she does not use drugs.  Allergies  Allergen Reactions  . Ace Inhibitors Other (See Comments)    cough    Family History  Problem Relation Age of Onset  . Hypertension Mother   . Stroke Mother   . Coronary artery disease Father        CAD in female relative <50  . Heart attack Father   . Heart attack Brother 24  . Heart Problems Sister 37       perminant pacemaker  . Suicidality Son 47       2010  . Healthy Grandchild   . Colon cancer Neg Hx   . Breast cancer Neg Hx   . Stomach cancer Neg Hx   . Rectal cancer Neg Hx   . Esophageal cancer Neg Hx   . Liver cancer Neg Hx     Prior to Admission medications   Medication Sig Start Date End Date Taking? Authorizing Provider  aspirin 325 MG tablet Take 325 mg by mouth at bedtime.    Yes [provider]  amLODipine (NORVASC) 10 MG tablet Take 1 tablet (10 mg total) by mouth daily. 08/22/19   Colon Branch, MD  atorvastatin (LIPITOR) 80 MG tablet Take 1 tablet (80 mg total) by mouth at bedtime. 01/31/20   Colon Branch, MD  budesonide-formoterol [facility]) 160-4.5 MCG/ACT inhaler Inhale 2 puffs into  the lungs 2 (two) times daily. 06/04/17   Colon Branch, MD  Cholecalciferol (VITAMIN D3) 1000 UNITS CAPS Take 1,000 Units by mouth daily.    [provider]  clonazePAM (KLONOPIN) 0.5 MG tablet TAKE 1 TABLET BY MOUTH IN THE MORNING AS NEEDED FOR ANXIETY AND 1 & 1/2 TO 2 (ONE & ONE-HALF TO TWO) AT BEDTIME AS NEEDED FOR SLEEP 05/25/20   Colon Branch, MD  ezetimibe (ZETIA) 10 MG tablet Take 1 tablet (10 mg total) by mouth daily. 02/28/20   Richardson Dopp T, PA-C  FLUoxetine (PROZAC) 20 MG capsule Take 2 capsules (40 mg total) by mouth daily. 08/01/19   Colon Branch, MD  losartan (COZAAR) 100 MG tablet Take 1 tablet by mouth once daily 05/31/19   Sherren Mocha, MD  metoprolol succinate (TOPROL XL) 25 MG 24 hr tablet Take 1 tablet (25 mg total) by mouth daily. 03/20/20   Richardson Dopp T, PA-C  potassium chloride SA (KLOR-CON) 20 MEQ tablet Take 20 mEq by mouth daily.     [provider]    Physical Exam: Vitals:   07/07/20 1312 07/07/20 1314 07/07/20 1404 07/07/20 1536  BP:  (!) 174/57  (!) 167/56  Pulse:  63  (!) 55  Resp:  18    Temp:  (!) 97.5 F (36.4 C)    TempSrc:  Oral    SpO2:  97% 97% 99%  Weight: 44.7 kg     Height: 5\' 2"  (1.575 m)       Physical Exam Vitals and nursing note reviewed.  HENT:     Head: Normocephalic and atraumatic.  Eyes:     Extraocular  Movements: Extraocular movements intact.     Pupils: Pupils are equal, round, and reactive to light.  Cardiovascular:     Rate and Rhythm: Normal rate and regular rhythm.  Pulmonary:     Effort: Pulmonary effort is normal. No tachypnea.     Breath sounds: Examination of the right-upper field reveals rales. Examination of the left-upper field reveals rales. Examination of the right-middle field reveals rales. Examination of the left-middle field reveals rales. Examination of the right-lower field reveals rales. Examination of the left-lower field reveals rales. Rales present.  Abdominal:     General: There is no  distension.     Palpations: Abdomen is soft. There is no mass.  Skin:    General: Skin is warm.  Neurological:     General: No focal deficit present.     Mental Status: She is alert and oriented to person, place, and time.  Psychiatric:        Mood and Affect: Mood normal.        Behavior: Behavior normal.    Labs on Admission: I have personally reviewed following labs and imaging studies Labs  No results for input(s): CKTOTAL, CKMB, TROPONINI in the last 72 hours. Lab Results  Component Value Date   WBC 8.4 07/07/2020   HGB 13.0 07/07/2020   HCT 41.0 07/07/2020   MCV 95.6 07/07/2020   PLT 155 07/07/2020    Recent Labs  Lab 07/06/20 2358  NA 143  K 3.8  CL 106  CO2 31  BUN 10  CREATININE 0.61  CALCIUM 8.8*  GLUCOSE 122*   Lab Results  Component Value Date   CHOL 149 01/25/2020   HDL 51.00 01/25/2020   LDLCALC 76 01/25/2020   TRIG 110.0 01/25/2020   Lab Results  Component Value Date   DDIMER 0.58 (H) 07/06/2020   Invalid input(s): POCBNP  Urinalysis    Component Value Date/Time   COLORURINE YELLOW 12/04/2014 1346   APPEARANCEUR CLEAR 12/04/2014 1346   LABSPEC >=1.030 (A) 12/04/2014 1346   PHURINE 5.5 12/04/2014 1346   GLUCOSEU NEGATIVE 12/04/2014 1346   HGBUR NEGATIVE 12/04/2014 1346   BILIRUBINUR NEGATIVE 12/04/2014 1346   BILIRUBINUR neg 09/16/2014 1503   KETONESUR NEGATIVE 12/04/2014 1346   PROTEINUR neg 09/16/2014 1503   PROTEINUR NEGATIVE 12/13/2013 1605   UROBILINOGEN 0.2 12/04/2014 1346   NITRITE POSITIVE (A) 12/04/2014 1346   LEUKOCYTESUR TRACE (A) 12/04/2014 1346      echocardiogram  COVID-19 Labs  Recent Labs    07/06/20 2358  DDIMER 0.58*    Lab Results  Component Value Date   SARSCOV2NAA NEGATIVE 07/07/2020   [place] NEGATIVE 09/06/2019    Radiological Exams on Admission: CT Angio Chest PE W and/or Wo Contrast  Result Date: 07/07/2020 CLINICAL DATA:  PE suspected. Shortness of breath. History of anal cancer.  EXAM: CT ANGIOGRAPHY CHEST WITH CONTRAST TECHNIQUE: Multidetector CT imaging of the chest was performed using the standard protocol during bolus administration of intravenous contrast. Multiplanar CT image reconstructions and MIPs were obtained to evaluate the vascular anatomy. CONTRAST:  145mL OMNIPAQUE IOHEXOL 350 MG/ML SOLN COMPARISON:  CT chest dated October 16, 2017. FINDINGS: Cardiovascular: Contrast injection is sufficient to demonstrate satisfactory opacification of the pulmonary arteries to the segmental level. There is no pulmonary embolus or evidence of right heart strain. The size of the main pulmonary artery is normal. Cardiomegaly with coronary artery calcification. The course and caliber of the aorta are normal. There is mild atherosclerotic calcification. Opacification decreased due to pulmonary  arterial phase contrast bolus timing. Mediastinum/Nodes: -- No mediastinal lymphadenopathy. -- No hilar lymphadenopathy. -- No axillary lymphadenopathy. -- No supraclavicular lymphadenopathy. --again noted is a large right-sided thyroid nodule measuring approximately 2.7 cm. -  Unremarkable esophagus. Lungs/Pleura: There are small bilateral pleural effusions with adjacent atelectasis, left worse than right. There is bronchial wall thickening mucous plugging bilateral lower lobes. The trachea is unremarkable. Emphysematous changes are noted. Upper Abdomen: Contrast bolus timing is not optimized for evaluation of the abdominal organs. There is a tiny hypoattenuating nodule in the left hepatic lobe that is too small to characterize. This is stable from prior study in January of 2021. A left adrenal mass is again noted. This is stable from prior study. Musculoskeletal: No chest wall abnormality. No bony spinal canal stenosis. Review of the MIP images confirms the above findings. IMPRESSION: 1. No evidence of pulmonary embolus. 2. Small bilateral pleural effusions with adjacent atelectasis, left worse than right. 3.  Bronchial wall thickening and mucous plugging bilateral lower lobes, can be seen in patients with reactive or infectious bronchiolitis. 4. Cardiomegaly with coronary artery disease. Interlobular septal thickening is noted suggestive of mild interstitial edema. 5. Right-sided thyroid nodule measuring approximately 2.7 cm. Recommend thyroid US as an outpatient.(Ref: J Am Coll Radiol. 2015 Feb;12(2): 143-50). Aortic Atherosclerosis (ICD10-I70.0) and Emphysema (ICD10-J43.9). Electronically Signed   By: Constance Holster M.D.   On: 07/07/2020 01:58   DG Chest Portable 1 View  Result Date: 07/06/2020 CLINICAL DATA:  Acute shortness of breath, COPD. EXAM: PORTABLE CHEST 1 VIEW COMPARISON:  Chest x-ray 05/29/2020, CT chest 10/16/2017 FINDINGS: The heart size and mediastinal contours are unchanged. Marked atherosclerotic plaque of the aorta. Cardiac surgical changes. Coarsened interstitial markings flattening of the hemidiaphragms consistent with emphysema. Biapical pleural/pulmonary scarring again noted. Interval development of a retrocardiac opacity and possible trace left pleural effusion. Nonspecific blunting of the right costophrenic angle. No pulmonary edema. No pneumothorax. No acute osseous abnormality. IMPRESSION: 1. Interval development of a retrocardiac opacity and possible trace left pleural effusion. Findings could be infectious or inflammatory. 2. Aortic Atherosclerosis (ICD10-I70.0) and Emphysema (ICD10-J43.9). Electronically Signed   By: Iven Finn M.D.   On: 07/06/2020 23:29    EKG: Independently reviewed.  SR 68/lvh and qtc 502.    Assessment/Plan Principal Problem:   Acute respiratory failure with hypoxia (HCC) Active Problems:   SOB (shortness of breath)   COPD (chronic obstructive pulmonary disease) (HCC)   Essential hypertension  A/C hypoxic RF/ SOB ; Attribute to CHF, SpO2: 99 % O2 Flow Rate (L/min): 2 L/min Pt oxygenating ok on 2L [place]. Pt started on iv diuretic  therapy. Home meds metoprolol, cozaar, zetia,lipitor, and aspirin. Will hold amlodipine to allow room for diuresis.  Repeat echo to assess ef and cardiology consult by am md.  Strict I/O.  COPD: Cont nebs and mdi and supplemental oxygen.  VBG pending  HTN: Cont cozaar and metoprolol. Cardiac low sodium diet.     DVT prophylaxis:  Heparin   Code Status:  Full Code   Family Communication:  None at bedside   Disposition Plan:  Home    Consults called:  none  Admission status: Status is: Inpatient  Remains inpatient appropriate because:Inpatient level of care appropriate due to severity of illness   Dispo: The patient is from: Home              Anticipated d/c is to: Home              Anticipated d/c  date is: 3 days              Patient currently is not medically stable to d/c.   Para Skeans MD [facility] [phone] How to contact the [facility] Attending or Consulting provider [place] or covering provider during after hours [place], for this patient?    1. Check the care team in [facility] and look for a) attending/consulting [facility] provider listed and b) the [facility] team listed 2. Log into [email or web address] and use [place]'s universal password to access. If you do not have the password, please contact the hospital operator. 3. Locate the [facility] provider you are looking for under [facility] and page to a number that you can be directly reached. 4. If you still have difficulty reaching the provider, please page the [facility] (Director on Call) for the Hospitalists listed on amion for assistance. [email or web address] Password [facility] 07/07/2020, 3:38 PM

## 2020-07-07 NOTE — ED Notes (Signed)
[facility] here for transfer to [facility]

## 2020-07-07 NOTE — ED Notes (Signed)
Pt uncomfortable with purewick, unable to urinate. removed and utilized bedside commode beside stretcher. Increased work of breathing, pursed lip breathing. Sp)2 on 2L down to 82/83% with good pleth. EDP notified.

## 2020-07-07 NOTE — ED Provider Notes (Signed)
She was observed to have an eight beat run of ventricular tachycardia.   Delora Fuel, MD 88/32/54 [phone]

## 2020-07-08 DIAGNOSIS — J9601 Acute respiratory failure with hypoxia: Secondary | ICD-10-CM

## 2020-07-08 MED ORDER — AMLODIPINE BESYLATE 5 MG PO TABS
5.0000 mg | ORAL_TABLET | Freq: Every day | ORAL | Status: DC
  Administered 2020-07-08: 5 mg via ORAL
  Filled 2020-07-08: qty 1

## 2020-07-08 MED ORDER — FUROSEMIDE 10 MG/ML IJ SOLN
40.0000 mg | Freq: Two times a day (BID) | INTRAMUSCULAR | Status: DC
  Administered 2020-07-08 – 2020-07-10 (×5): 40 mg via INTRAVENOUS
  Filled 2020-07-08 (×5): qty 4

## 2020-07-08 MED ORDER — AMLODIPINE BESYLATE 10 MG PO TABS
10.0000 mg | ORAL_TABLET | Freq: Every day | ORAL | Status: DC
  Administered 2020-07-09 – 2020-07-10 (×2): 10 mg via ORAL
  Filled 2020-07-08 (×2): qty 1

## 2020-07-08 MED ORDER — GUAIFENESIN-DM 100-10 MG/5ML PO SYRP: 5.0000 mL | ORAL_SOLUTION | ORAL | Status: DC | PRN

## 2020-07-08 MED ORDER — AMLODIPINE BESYLATE 5 MG PO TABS
5.0000 mg | ORAL_TABLET | Freq: Once | ORAL | Status: AC
  Administered 2020-07-08: 5 mg via ORAL
  Filled 2020-07-08: qty 1

## 2020-07-08 MED ORDER — HYDRALAZINE HCL 25 MG PO TABS
25.0000 mg | ORAL_TABLET | Freq: Four times a day (QID) | ORAL | Status: DC | PRN
  Administered 2020-07-08: 25 mg via ORAL
  Filled 2020-07-08: qty 1

## 2020-07-08 NOTE — Progress Notes (Signed)
PROGRESS NOTE    Patricia [name]  NOB:[id number] DOB: 1941/04/10 DOA: 07/06/2020 PCP: Colon Branch, MD    Brief Narrative:  Patricia [name] is a 79 year old female with past medical history significant for CAD s/p CABG/PCI, COPD, chronic diastolic congestive heart failure, essential hypertension, peripheral artery disease, anal cancers s/p chemotherapy/radiation, carotid artery stenosis who initially presented to [facility] with chest pressure and shortness of breath.  Patient reports dyspnea ongoing over the past few months but has been progressively worsening recently.  EMS was called and patient was noted to be hypoxic and placed on nonrebreather mask.  In the ED, temperature 97.8, HR 66, RR 13, BP 184/58, SPO2 98% on 6 L nasal cannula sodium 143, potassium 3.8, chloride 106, CO2 31, glucose 122, BUN 10, creatinine 0.61.  WBC count 4.8, hemoglobin 11.7, platelets 147.  BNP 865.9.  Troponin 30>41.  EKG with NSR, rate 48, no ST elevation/depression or T wave inversions, QTC 502.  Chest x-ray with retrocardiac opacity consistent with infectious versus inflammatory process.  CT angiogram chest negative for PE with small bilateral pleural effusions, bronchial wall thickening and cardiomegaly with interstitial edema.  Patient was given IV Lasix.  Hospitalist service consulted for further evaluation and management.   Assessment & Plan:   Principal Problem:   Acute respiratory failure with hypoxia (HCC) Active Problems:   Essential hypertension   COPD (chronic obstructive pulmonary disease) (HCC)   SOB (shortness of breath)   Acute respiratory failure with hypoxia Acute on chronic decompensated diastolic congestive heart failure Patient presenting with progressive shortness of breath over the past several months.  Was notably hypoxic on EMS arrival placed on nonrebreather.  On ED arrival, patient requiring 6 L nasal cannula to maintain SPO2.  Noted elevated BNP with CTA negative for PE  but with cardiomegaly and interstitial edema.  Recent TTE 03/15/2020 with LVEF 59%, no regional wall motion abnormalities LV, grade 1 diastolic dysfunction. --Furosemide 40 mg IV every 12 hours --Continue metoprolol succinate and losartan --Monitor on telemetry --Titrate supplemental oxygen to maintain SPO2 greater than 88% --Ambulatory oxygen screen today --Strict I's and O's --Daily weights  Hypertensive urgency BP elevated up to 212/70 this morning.  Review of EMR since admission notes poorly controlled BP.  Patient states is compliant with her home regimen of metoprolol and losartan. --Starting amlodipine 5 mg p.o. daily --Metoprolol succinate 25 mg p.o. daily --Losartan 100 mg p.o. daily --Furosemide as above --Hydralazine 25 mg every 6 hours prn SBP >170 or DBP >110 --Continue to monitor blood pressure closely, will titrate as needed  Elevated troponin secondary to type II demand ischemia Troponin slightly elevated 30>41.  EKG with no concerning signs of ischemic changes.  Etiology likely type II demand ischemia from hypertensive urgency and decompensated CHF as above. --Continue aggressive treatment with IV furosemide and antihypertensives --Continue to monitor on telemetry  QTC prolongation QTC 502 on EKG. --Continue monitor on telemetry --Avoid QTC prolonging medications  CAD status post CABG/PCI Peripheral artery disease Hx of branch retinal artery occlusion, right eye Follows with cardiology and retina specialist outpatient. --Metoprolol succinate 25 mg p.o. daily --Losartan 100 mg p.o. daily --Aspirin 325 mg p.o. daily --Atorvastatin 80 mg p.o. daily --Ezetimibe 10 mg p.o. daily  Carotid artery stenosis Carotid ultrasound with 1-39% bilateral ICA stenosis; with heavy plaque burden --Continue outpatient follow-up with cardiology with annual carotid ultrasound. --Continue statin and aspirin  COPD, nonoxygen dependent --Breo Ellipta 200-25 mcg 1 puff inhalation  daily --Continue  supplemental oxygen as above, titrate to maintain SPO2 greater than 88%  Depression/anxiety: --Fluoxetine 40 mg p.o. daily  Right thyroid nodule CTA with finding of right thyroid nodule 2.5 cm.  Has been noted previously on PET scan by oncology 09/22/2019 with hypermetabolic mass right neck.  She has declined further evaluation citing a long history of thyroid nodules during oncology visits dated 01/16/2020.  Continue outpatient follow-up as desired.  Hx anal cancer s/p chemotherapy/radiation Follows with medical oncology, Dr. Benay Spice outpatient. --Outpatient follow-up with oncology   DVT prophylaxis: Heparin Code Status: DNR Family Communication: No family present at bedside  Disposition Plan:  Status is: Inpatient  Remains inpatient appropriate because:Ongoing diagnostic testing needed not appropriate for outpatient work up, Unsafe d/c plan, IV treatments appropriate due to intensity of illness or inability to take PO and Inpatient level of care appropriate due to severity of illness   Dispo: The patient is from: Home              Anticipated d/c is to: Home              Anticipated d/c date is: 2 days              Patient currently is not medically stable to d/c.  Consultants:   None  Procedures:   None  Antimicrobials:   None   Subjective: Patient seen and examined bedside, resting comfortably, states dyspnea improved, but not back to her normal baseline.  Chest pressure has resolved.  Continues on supplemental oxygen, now titrated down to 2 L per nasal cannula.  No other specific complaints or concerns at this time.  Denies headache, no visual changes, no chest pain, no palpitations, no abdominal pain, no fever/chills/night sweats, no nausea/vomiting/diarrhea, no weakness, no fatigue, no paresthesias.  Nursing concern regarding elevated blood pressure this morning, otherwise no acute events overnight.  Objective: Vitals:   07/08/20 0751 07/08/20 0751  07/08/20 0917 07/08/20 0957  BP: (!) 158/89 (!) 212/70    Pulse: 84 75    Resp: 18 18    Temp: 98.6 F (37 C) 98.2 F (36.8 C)  98.6 F (37 C)  TempSrc:  Oral    SpO2:  93% 97%   Weight:      Height:        Intake/Output Summary (Last 24 hours) at 07/08/2020 1025 Last data filed at 07/07/2020 2244 Gross per 24 hour  Intake 840 ml  Output 350 ml  Net 490 ml   Filed Weights   07/06/20 2254 07/07/20 1312 07/08/20 0005  Weight: 49.2 kg 44.7 kg 44.5 kg    Examination:  General exam: Appears calm and comfortable  Respiratory system: Slightly decreased breath sounds bilaterally bases with crackles, normal respiratory effort without discussed 3 muscle use,  On 2 L nasal cannula Cardiovascular system: S1 & S2 heard, RRR. No JVD, murmurs, rubs, gallops or clicks. No pedal edema. Gastrointestinal system: Abdomen is nondistended, soft and nontender. No organomegaly or masses felt. Normal bowel sounds heard. Central nervous system: Alert and oriented. No focal neurological deficits. Extremities: Symmetric 5 x 5 power. Skin: No rashes, lesions or ulcers Psychiatry: Judgement and insight appear normal. Mood & affect appropriate.     Data Reviewed: I have personally reviewed following labs and imaging studies  CBC: Recent Labs  Lab 07/06/20 2358 07/07/20 1504  WBC 4.8 8.4  NEUTROABS 3.6  --   HGB 11.7* 13.0  HCT 36.4 41.0  MCV 96.8 95.6  PLT 147* 155  Basic Metabolic Panel: Recent Labs  Lab 07/06/20 2358 07/07/20 1504 07/07/20 1714  NA 143  --  142  K 3.8  --  3.7  CL 106  --  102  CO2 31  --  30  GLUCOSE 122*  --  101*  BUN 10  --  5*  CREATININE 0.61 0.62 0.54  CALCIUM 8.8*  --  9.6  MG 2.0  --   --    GFR: Estimated Creatinine Clearance: 39.4 mL/min (by C-G formula based on SCr of 0.54 mg/dL). Liver Function Tests: Recent Labs  Lab 07/07/20 1714  AST 23  ALT 17  ALKPHOS 127*  BILITOT 0.7  PROT 6.0*  ALBUMIN 3.3*   No results for input(s): LIPASE,  AMYLASE in the last 168 hours. No results for input(s): AMMONIA in the last 168 hours. Coagulation Profile: No results for input(s): INR, PROTIME in the last 168 hours. Cardiac Enzymes: No results for input(s): CKTOTAL, CKMB, CKMBINDEX, TROPONINI in the last 168 hours. BNP (last 3 results) No results for input(s): PROBNP in the last 8760 hours. HbA1C: No results for input(s): HGBA1C in the last 72 hours. CBG: No results for input(s): GLUCAP in the last 168 hours. Lipid Profile: No results for input(s): CHOL, HDL, LDLCALC, TRIG, CHOLHDL, LDLDIRECT in the last 72 hours. Thyroid Function Tests: No results for input(s): TSH, T4TOTAL, FREET4, T3FREE, THYROIDAB in the last 72 hours. Anemia Panel: No results for input(s): VITAMINB12, FOLATE, FERRITIN, TIBC, IRON, RETICCTPCT in the last 72 hours. Sepsis Labs: No results for input(s): PROCALCITON, LATICACIDVEN in the last 168 hours.  Recent Results (from the past 240 hour(s))  Resp Panel by RT-PCR (Flu A&B, Covid) Nasopharyngeal Swab     Status: None   Collection Time: 07/07/20  2:40 AM   Specimen: Nasopharyngeal Swab; Nasopharyngeal(NP) swabs in vial transport medium  Result Value Ref Range Status   SARS Coronavirus 2 by RT PCR NEGATIVE NEGATIVE Final    Comment: (NOTE) SARS-CoV-2 target nucleic acids are NOT DETECTED.  The SARS-CoV-2 RNA is generally detectable in upper respiratory specimens during the acute phase of infection. The lowest concentration of SARS-CoV-2 viral copies this assay can detect is 138 copies/mL. A negative result does not preclude SARS-Cov-2 infection and should not be used as the sole basis for treatment or other patient management decisions. A negative result may occur with  improper specimen collection/handling, submission of specimen other than nasopharyngeal swab, presence of viral mutation(s) within the areas targeted by this assay, and inadequate number of viral copies(<138 copies/mL). A negative result  must be combined with clinical observations, patient history, and epidemiological information. The expected result is Negative.  Fact Sheet for Patients:  EntrepreneurPulse.com.au  Fact Sheet for Healthcare Providers:  IncredibleEmployment.be  This test is no t yet approved or cleared by the [place] FDA and  has been authorized for detection and/or diagnosis of SARS-CoV-2 by FDA under an Emergency Use Authorization (EUA). This EUA will remain  in effect (meaning this test can be used) for the duration of the COVID-19 declaration under Section 564(b)(1) of the Act, 21 U.S.C.section 360bbb-3(b)(1), unless the authorization is terminated  or revoked sooner.       Influenza A by PCR NEGATIVE NEGATIVE Final   Influenza B by PCR NEGATIVE NEGATIVE Final    Comment: (NOTE) The Xpert Xpress SARS-CoV-2/FLU/RSV plus assay is intended as an aid in the diagnosis of influenza from Nasopharyngeal swab specimens and should not be used as a sole basis for treatment. Nasal  washings and aspirates are unacceptable for Xpert Xpress SARS-CoV-2/FLU/RSV testing.  Fact Sheet for Patients: EntrepreneurPulse.com.au  Fact Sheet for Healthcare Providers: IncredibleEmployment.be  This test is not yet approved or cleared by the [place] FDA and has been authorized for detection and/or diagnosis of SARS-CoV-2 by FDA under an Emergency Use Authorization (EUA). This EUA will remain in effect (meaning this test can be used) for the duration of the COVID-19 declaration under Section 564(b)(1) of the Act, 21 U.S.C. section 360bbb-3(b)(1), unless the authorization is terminated or revoked.  Performed at [facility], [address], [place], [place] [address]          Radiology Studies: CT Angio Chest PE W and/or Wo Contrast  Result Date: 07/07/2020 CLINICAL DATA:  PE suspected. Shortness of breath. History  of anal cancer. EXAM: CT ANGIOGRAPHY CHEST WITH CONTRAST TECHNIQUE: Multidetector CT imaging of the chest was performed using the standard protocol during bolus administration of intravenous contrast. Multiplanar CT image reconstructions and MIPs were obtained to evaluate the vascular anatomy. CONTRAST:  151mL OMNIPAQUE IOHEXOL 350 MG/ML SOLN COMPARISON:  CT chest dated October 16, 2017. FINDINGS: Cardiovascular: Contrast injection is sufficient to demonstrate satisfactory opacification of the pulmonary arteries to the segmental level. There is no pulmonary embolus or evidence of right heart strain. The size of the main pulmonary artery is normal. Cardiomegaly with coronary artery calcification. The course and caliber of the aorta are normal. There is mild atherosclerotic calcification. Opacification decreased due to pulmonary arterial phase contrast bolus timing. Mediastinum/Nodes: -- No mediastinal lymphadenopathy. -- No hilar lymphadenopathy. -- No axillary lymphadenopathy. -- No supraclavicular lymphadenopathy. --again noted is a large right-sided thyroid nodule measuring approximately 2.7 cm. -  Unremarkable esophagus. Lungs/Pleura: There are small bilateral pleural effusions with adjacent atelectasis, left worse than right. There is bronchial wall thickening mucous plugging bilateral lower lobes. The trachea is unremarkable. Emphysematous changes are noted. Upper Abdomen: Contrast bolus timing is not optimized for evaluation of the abdominal organs. There is a tiny hypoattenuating nodule in the left hepatic lobe that is too small to characterize. This is stable from prior study in January of 2021. A left adrenal mass is again noted. This is stable from prior study. Musculoskeletal: No chest wall abnormality. No bony spinal canal stenosis. Review of the MIP images confirms the above findings. IMPRESSION: 1. No evidence of pulmonary embolus. 2. Small bilateral pleural effusions with adjacent atelectasis, left worse  than right. 3. Bronchial wall thickening and mucous plugging bilateral lower lobes, can be seen in patients with reactive or infectious bronchiolitis. 4. Cardiomegaly with coronary artery disease. Interlobular septal thickening is noted suggestive of mild interstitial edema. 5. Right-sided thyroid nodule measuring approximately 2.7 cm. Recommend thyroid US as an outpatient.(Ref: J Am Coll Radiol. 2015 Feb;12(2): 143-50). Aortic Atherosclerosis (ICD10-I70.0) and Emphysema (ICD10-J43.9). Electronically Signed   By: Constance Holster M.D.   On: 07/07/2020 01:58   DG Chest Portable 1 View  Result Date: 07/06/2020 CLINICAL DATA:  Acute shortness of breath, COPD. EXAM: PORTABLE CHEST 1 VIEW COMPARISON:  Chest x-ray 05/29/2020, CT chest 10/16/2017 FINDINGS: The heart size and mediastinal contours are unchanged. Marked atherosclerotic plaque of the aorta. Cardiac surgical changes. Coarsened interstitial markings flattening of the hemidiaphragms consistent with emphysema. Biapical pleural/pulmonary scarring again noted. Interval development of a retrocardiac opacity and possible trace left pleural effusion. Nonspecific blunting of the right costophrenic angle. No pulmonary edema. No pneumothorax. No acute osseous abnormality. IMPRESSION: 1. Interval development of a retrocardiac opacity and  possible trace left pleural effusion. Findings could be infectious or inflammatory. 2. Aortic Atherosclerosis (ICD10-I70.0) and Emphysema (ICD10-J43.9). Electronically Signed   By: Iven Finn M.D.   On: 07/06/2020 23:29        Scheduled Meds: . amLODipine  5 mg Oral Daily  . aspirin  325 mg Oral QHS  . atorvastatin  80 mg Oral QHS  . ezetimibe  10 mg Oral Daily  . FLUoxetine  40 mg Oral Daily  . fluticasone furoate-vilanterol  1 puff Inhalation Daily  . furosemide  40 mg Intravenous BID  . heparin  5,000 Units Subcutaneous Q8H  . losartan  100 mg Oral Daily  . metoprolol succinate  25 mg Oral Daily  . sodium  chloride flush  3 mL Intravenous Q12H   Continuous Infusions: . sodium chloride       LOS: 1 day    Time spent: 41 minutes spent on chart review, discussion with nursing staff, consultants, updating family and interview/physical exam; more than 50% of that time was spent in counseling and/or coordination of care.    Denyla Cortese J [place] DO [facility] Available via Epic secure chat 7am-7pm After these hours, please refer to coverage provider listed on [email or web address] 07/08/2020, 10:25 AM

## 2020-07-08 NOTE — Plan of Care (Signed)

## 2020-07-08 NOTE — Progress Notes (Signed)
   07/08/20 0751  Assess: MEWS Score  Temp 98.2 F (36.8 C)  BP (!) 212/70  Pulse Rate 75  Resp 18  Level of Consciousness Alert  SpO2 93 %  Patient Activity (if Appropriate) In bed  Assess: if the MEWS score is Yellow or Red  Were vital signs taken at a resting state? Yes  Focused Assessment Change from prior assessment (see assessment flowsheet)  Early Detection of Sepsis Score *See Row Information* Low  Treat  MEWS Interventions Administered scheduled meds/treatments  Pain Scale 0-10  Pain Score 0  Notify: Charge Nurse/RN  Name of Charge Nurse/RN Notified Tai Rn  Date Charge Nurse/RN Notified 07/08/20  Time Charge Nurse/RN Notified 0802  Notify: Provider  Provider Name/Title Eric [place] MD  Date Provider Notified 07/08/20  Time Provider Notified [phone]  Notification Type Page  Notification Reason Change in status  Response No new orders  Notify: Rapid Response  Name of Rapid Response RN Notified n/a  Document  Patient Outcome Not stable and remains on department  Progress note created (see row info) Yes

## 2020-07-08 NOTE — Progress Notes (Signed)
Patient has experienced Hypertension Paged MD this morning to make aware Gave Scheduled Med's as well as PRN's for blood pressure with  Improvement. Goal was to walk in the hall today Patient has declined to walk in the halls at this time.

## 2020-07-09 LAB — BASIC METABOLIC PANEL
Anion gap: 8 (ref 5–15)
BUN: 12 mg/dL (ref 8–23)
CO2: 32 mmol/L (ref 22–32)
Calcium: 9.5 mg/dL (ref 8.9–10.3)
Chloride: 97 mmol/L — ABNORMAL LOW (ref 98–111)
Creatinine, Ser: 0.81 mg/dL (ref 0.44–1.00)
GFR, Estimated: >60 mL/min (ref >60)
Glucose, Bld: 106 mg/dL — ABNORMAL HIGH (ref 70–99)
Potassium: 3.2 mmol/L — ABNORMAL LOW (ref 3.5–5.1)
Sodium: 137 mmol/L (ref 135–145)

## 2020-07-09 LAB — MAGNESIUM: Magnesium: 1.8 mg/dL (ref 1.7–2.4)

## 2020-07-09 MED ORDER — MAGNESIUM SULFATE 2 GM/50ML IV SOLN
2.0000 g | Freq: Once | INTRAVENOUS | Status: AC
  Administered 2020-07-09: 2 g via INTRAVENOUS
  Filled 2020-07-09: qty 50

## 2020-07-09 MED ORDER — HYDROCHLOROTHIAZIDE 12.5 MG PO CAPS
12.5000 mg | ORAL_CAPSULE | Freq: Every day | ORAL | Status: DC
  Administered 2020-07-09 – 2020-07-10 (×2): 12.5 mg via ORAL
  Filled 2020-07-09 (×2): qty 1

## 2020-07-09 MED ORDER — POTASSIUM CHLORIDE CRYS ER 20 MEQ PO TBCR
40.0000 meq | EXTENDED_RELEASE_TABLET | ORAL | Status: AC
  Administered 2020-07-09 (×2): 40 meq via ORAL
  Filled 2020-07-09: qty 2

## 2020-07-09 NOTE — Evaluation (Signed)
Occupational Therapy Evaluation Patient Details Name: Patricia [name] MRN: [id number] DOB: [birth year] Today's Date: 07/09/2020    History of Present Illness MAHLIA FERNANDO is a 79 year old female with past medical history significant for CAD s/p CABG/PCI, COPD, chronic diastolic congestive heart failure, essential hypertension, peripheral artery disease, anal cancers s/p chemotherapy/radiation, carotid artery stenosis who presents with chest pressure and SOB. Admitted with acute on chronic decompensated diastolic CHF and HTN urgency.   Clinical Impression   Pt is functioning modified independently, at her baseline. Educated in energy conservation strategies and provided written handout. No further OT needs.     Follow Up Recommendations  No OT follow up    Equipment Recommendations  None recommended by OT    Recommendations for Other Services       Precautions / Restrictions Precautions Precautions:  Precaution Comments: SpO2 > 88% Restrictions Weight Bearing Restrictions: No      Mobility Bed Mobility Pt walking about her room     Transfers Overall transfer level: Independent Equipment used: None                  Balance Overall balance assessment: Mild deficits observed, not formally tested                                         ADL either performed or assessed with clinical judgement   ADL Overall ADL's : Modified independent                                     Functional mobility during ADLs: Independent General ADL Comments: educated in energy conservation and provided handout     Vision Baseline Vision/History: Wears glasses Wears Glasses: Reading only       Perception     Praxis      Pertinent Vitals/Pain Pain Assessment: No/denies pain     Hand Dominance Right   Extremity/Trunk Assessment Upper Extremity Assessment Upper Extremity Assessment: Overall WFL for tasks assessed   Lower Extremity  Assessment Lower Extremity Assessment: Defer to PT evaluation       Communication Communication Communication: No difficulties   Cognition Arousal/Alertness: Awake/alert Behavior During Therapy: WFL for tasks assessed/performed Overall Cognitive Status: Within Functional Limits for tasks assessed                                     General Comments       Exercises    Shoulder Instructions      Home Living Family/patient expects to be discharged to:: Private residence Living Arrangements: Other relatives Available Help at Discharge: Family Type of Home: House Home Access: Level entry     Home Layout: One level     Bathroom Shower/Tub: Teacher, early years/pre: Standard     Home Equipment: None          Prior Functioning/Environment Level of Independence: Independent        Comments: Does not drive        OT Problem List:        OT Treatment/Interventions:      OT Goals(Current goals can be found in the care plan section) Acute Rehab OT Goals Patient Stated Goal: go home  OT Frequency:  Barriers to D/C:            Co-evaluation              AM-PAC OT "6 Clicks" Daily Activity     Outcome Measure Help from another person eating meals?: None Help from another person taking care of personal grooming?: None Help from another person toileting, which includes using toliet, bedpan, or urinal?: None Help from another person bathing (including washing, rinsing, drying)?: None Help from another person to put on and taking off regular upper body clothing?: None Help from another person to put on and taking off regular lower body clothing?: None 6 Click Score: 24   End of Session    Activity Tolerance: Patient tolerated treatment well Patient left: in chair;with call bell/phone within reach  OT Visit Diagnosis: Other (comment) (decreased activity tolerance)                Time: 7793-9030 OT Time Calculation (min):  12 min Charges:  OT General Charges $OT Visit: 1 Visit OT Evaluation $OT Eval Low Complexity: 1 Low  Nestor Lewandowsky, OTR/L Acute Rehabilitation Services Pager: [phone] Office: [phone] Malka So 07/09/2020, 10:37 AM

## 2020-07-09 NOTE — Progress Notes (Addendum)
PROGRESS NOTE    Patricia [name]  AVW:[id number] DOB: 18-Dec-1940 DOA: 07/06/2020 PCP: Colon Branch, MD    Brief Narrative:  Patricia [name] is a 79 year old female with past medical history significant for CAD s/p CABG/PCI, COPD, chronic diastolic congestive heart failure, essential hypertension, peripheral artery disease, anal cancers s/p chemotherapy/radiation, carotid artery stenosis who initially presented to [facility] with chest pressure and shortness of breath.  Patient reports dyspnea ongoing over the past few months but has been progressively worsening recently.  EMS was called and patient was noted to be hypoxic and placed on nonrebreather mask.  In the ED, temperature 97.8, HR 66, RR 13, BP 184/58, SPO2 98% on 6 L nasal cannula sodium 143, potassium 3.8, chloride 106, CO2 31, glucose 122, BUN 10, creatinine 0.61.  WBC count 4.8, hemoglobin 11.7, platelets 147.  BNP 865.9.  Troponin 30>41.  EKG with NSR, rate 48, no ST elevation/depression or T wave inversions, QTC 502.  Chest x-ray with retrocardiac opacity consistent with infectious versus inflammatory process.  CT angiogram chest negative for PE with small bilateral pleural effusions, bronchial wall thickening and cardiomegaly with interstitial edema.  Patient was given IV Lasix.  Hospitalist service consulted for further evaluation and management.   Assessment & Plan:   Principal Problem:   Acute respiratory failure with hypoxia (HCC) Active Problems:   Essential hypertension   COPD (chronic obstructive pulmonary disease) (HCC)   SOB (shortness of breath)   Acute respiratory failure with hypoxia Acute on chronic decompensated diastolic congestive heart failure Patient presenting with progressive shortness of breath over the past several months.  Was notably hypoxic on EMS arrival placed on nonrebreather.  On ED arrival, patient requiring 6 L nasal cannula to maintain SPO2.  Noted elevated BNP with CTA negative for PE  but with cardiomegaly and interstitial edema.  Recent TTE 03/15/2020 with LVEF 59%, no regional wall motion abnormalities LV, grade 1 diastolic dysfunction. --wt 49.2>44kg --Furosemide 40 mg IV every 12 hours --Continue metoprolol succinate and losartan --Monitor on telemetry --Titrate supplemental oxygen to maintain SPO2 greater than 88% --Ambulatory oxygen screen today --Strict I's and O's --Daily weights --O2 desaturation screen tomorrow  Hypertensive urgency BP elevated up to 212/70 on admission.  Review of EMR since admission notes poorly controlled BP.  Patient states is compliant with her home regimen of metoprolol and losartan. --BP 164/64 this am --Started on amlodipine 10 mg p.o. daily --started HCTZ 12.5mg  daily 11/29 --Metoprolol succinate 25 mg p.o. daily --Losartan 100 mg p.o. daily --Furosemide as above --Hydralazine 25 mg every 6 hours prn SBP >170 or DBP >110 --Continue to monitor blood pressure closely, will titrate as needed  Hypokalemia Hypomagnesemia Etiology likely secondary to aggressive IV diuresis as above.  Potassium 3.2, magnesium 1.8.  Will replete both potassium and magnesium today. --Repeat electrolytes in the a.m.  Elevated troponin secondary to type II demand ischemia Troponin slightly elevated 30>41.  EKG with no concerning signs of ischemic changes.  Etiology likely type II demand ischemia from hypertensive urgency and decompensated CHF as above. --Continue aggressive treatment with IV furosemide and antihypertensives --Continue to monitor on telemetry  QTC prolongation QTC 502 on EKG. --Continue monitor on telemetry --Avoid QTC prolonging medications  CAD status post CABG/PCI Peripheral artery disease Hx of branch retinal artery occlusion, right eye Follows with cardiology and retina specialist outpatient. --Metoprolol succinate 25 mg p.o. daily --Losartan 100 mg p.o. daily --Aspirin 325 mg p.o. daily --Atorvastatin 80 mg p.o.  daily --  Ezetimibe 10 mg p.o. daily  Carotid artery stenosis Carotid ultrasound with 1-39% bilateral ICA stenosis; with heavy plaque burden --Continue outpatient follow-up with cardiology with annual carotid ultrasound. --Continue statin and aspirin  COPD, nonoxygen dependent --Breo Ellipta 200-25 mcg 1 puff inhalation daily --Continue supplemental oxygen as above, titrate to maintain SPO2 greater than 88%  Depression/anxiety: --Fluoxetine 40 mg p.o. daily  Right thyroid nodule CTA with finding of right thyroid nodule 2.5 cm.  Has been noted previously on PET scan by oncology 09/22/2019 with hypermetabolic mass right neck.  She has declined further evaluation citing a long history of thyroid nodules during oncology visits dated 01/16/2020.  Continue outpatient follow-up as desired.  Hx anal cancer s/p chemotherapy/radiation Follows with medical oncology, Dr. Benay Spice outpatient. --Outpatient follow-up with oncology   DVT prophylaxis: Heparin Code Status: DNR Family Communication: No family present at bedside  Disposition Plan:  Status is: Inpatient  Remains inpatient appropriate because:Ongoing diagnostic testing needed not appropriate for outpatient work up, Unsafe d/c plan, IV treatments appropriate due to intensity of illness or inability to take PO and Inpatient level of care appropriate due to severity of illness   Dispo: The patient is from: Home              Anticipated d/c is to: Home              Anticipated d/c date is: 1 day              Patient currently is not medically stable to d/c.  Consultants:   None  Procedures:   None  Antimicrobials:   None   Subjective: Patient seen and examined bedside, resting comfortably in bedside chair.  Just work with physical therapy with mild desaturation on room air during ambulation to 87%.  Patient denied any symptoms such as dizziness, weakness.  Continues with good urine output with IV furosemide.  BP improved since  yesterday, but not optimized.  Discussed with patient addition of another BP medication this morning.  Oxygenating well at rest.  No other questions or concerns at this time.  Denies headache, no dizziness, no chest pain, no palpitations, no fever/chills/night sweats, no nausea/vomiting/diarrhea, no weakness, no fatigue, no paresthesias.  No acute concerns per nursing staff overnight.  Objective: Vitals:   07/08/20 1632 07/08/20 2011 07/09/20 0358 07/09/20 0401  BP: (!) 177/65 (!) 151/85 (!) 164/64   Pulse: 70 68 64   Resp: 18 17 18    Temp: 97.8 F (36.6 C) 98.4 F (36.9 C) 98.3 F (36.8 C)   TempSrc: Oral Oral Oral   SpO2: 93% 91% 93%   Weight:    44 kg  Height:        Intake/Output Summary (Last 24 hours) at 07/09/2020 1014 Last data filed at 07/09/2020 0906 Gross per 24 hour  Intake 603 ml  Output 1100 ml  Net -497 ml   Filed Weights   07/07/20 1312 07/08/20 0005 07/09/20 0401  Weight: 44.7 kg 44.5 kg 44 kg    Examination:  General exam: Appears calm and comfortable  Respiratory system: Slightly decreased breath sounds, normal respiratory effort without discussed 3 muscle use,  On room air at rest but desaturates to 87% with exertion/ambulation this morning Cardiovascular system: S1 & S2 heard, RRR. No JVD, murmurs, rubs, gallops or clicks. No pedal edema. Gastrointestinal system: Abdomen is nondistended, soft and nontender. No organomegaly or masses felt. Normal bowel sounds heard. Central nervous system: Alert and oriented. No focal neurological deficits. Extremities:  Symmetric 5 x 5 power. Skin: No rashes, lesions or ulcers Psychiatry: Judgement and insight appear normal. Mood & affect appropriate.     Data Reviewed: I have personally reviewed following labs and imaging studies  CBC: Recent Labs  Lab 07/06/20 2358 07/07/20 1504  WBC 4.8 8.4  NEUTROABS 3.6  --   HGB 11.7* 13.0  HCT 36.4 41.0  MCV 96.8 95.6  PLT 147* 546   Basic Metabolic Panel: Recent  Labs  Lab 07/06/20 2358 07/07/20 1504 07/07/20 1714 07/09/20 0115  NA 143  --  142 137  K 3.8  --  3.7 3.2*  CL 106  --  102 97*  CO2 31  --  30 32  GLUCOSE 122*  --  101* 106*  BUN 10  --  5* 12  CREATININE 0.61 0.62 0.54 0.81  CALCIUM 8.8*  --  9.6 9.5  MG 2.0  --   --  1.8   GFR: Estimated Creatinine Clearance: 38.5 mL/min (by C-G formula based on SCr of 0.81 mg/dL). Liver Function Tests: Recent Labs  Lab 07/07/20 1714  AST 23  ALT 17  ALKPHOS 127*  BILITOT 0.7  PROT 6.0*  ALBUMIN 3.3*   No results for input(s): LIPASE, AMYLASE in the last 168 hours. No results for input(s): AMMONIA in the last 168 hours. Coagulation Profile: No results for input(s): INR, PROTIME in the last 168 hours. Cardiac Enzymes: No results for input(s): CKTOTAL, CKMB, CKMBINDEX, TROPONINI in the last 168 hours. BNP (last 3 results) No results for input(s): PROBNP in the last 8760 hours. HbA1C: No results for input(s): HGBA1C in the last 72 hours. CBG: No results for input(s): GLUCAP in the last 168 hours. Lipid Profile: No results for input(s): CHOL, HDL, LDLCALC, TRIG, CHOLHDL, LDLDIRECT in the last 72 hours. Thyroid Function Tests: No results for input(s): TSH, T4TOTAL, FREET4, T3FREE, THYROIDAB in the last 72 hours. Anemia Panel: No results for input(s): VITAMINB12, FOLATE, FERRITIN, TIBC, IRON, RETICCTPCT in the last 72 hours. Sepsis Labs: No results for input(s): PROCALCITON, LATICACIDVEN in the last 168 hours.  Recent Results (from the past 240 hour(s))  Resp Panel by RT-PCR (Flu A&B, Covid) Nasopharyngeal Swab     Status: None   Collection Time: 07/07/20  2:40 AM   Specimen: Nasopharyngeal Swab; Nasopharyngeal(NP) swabs in vial transport medium  Result Value Ref Range Status   SARS Coronavirus 2 by RT PCR NEGATIVE NEGATIVE Final    Comment: (NOTE) SARS-CoV-2 target nucleic acids are NOT DETECTED.  The SARS-CoV-2 RNA is generally detectable in upper respiratory specimens  during the acute phase of infection. The lowest concentration of SARS-CoV-2 viral copies this assay can detect is 138 copies/mL. A negative result does not preclude SARS-Cov-2 infection and should not be used as the sole basis for treatment or other patient management decisions. A negative result may occur with  improper specimen collection/handling, submission of specimen other than nasopharyngeal swab, presence of viral mutation(s) within the areas targeted by this assay, and inadequate number of viral copies(<138 copies/mL). A negative result must be combined with clinical observations, patient history, and epidemiological information. The expected result is Negative.  Fact Sheet for Patients:  EntrepreneurPulse.com.au  Fact Sheet for Healthcare Providers:  IncredibleEmployment.be  This test is no t yet approved or cleared by the [place] FDA and  has been authorized for detection and/or diagnosis of SARS-CoV-2 by FDA under an Emergency Use Authorization (EUA). This EUA will remain  in effect (meaning this test can be  used) for the duration of the COVID-19 declaration under Section 564(b)(1) of the Act, 21 U.S.C.section 360bbb-3(b)(1), unless the authorization is terminated  or revoked sooner.       Influenza A by PCR NEGATIVE NEGATIVE Final   Influenza B by PCR NEGATIVE NEGATIVE Final    Comment: (NOTE) The Xpert Xpress SARS-CoV-2/FLU/RSV plus assay is intended as an aid in the diagnosis of influenza from Nasopharyngeal swab specimens and should not be used as a sole basis for treatment. Nasal washings and aspirates are unacceptable for Xpert Xpress SARS-CoV-2/FLU/RSV testing.  Fact Sheet for Patients: EntrepreneurPulse.com.au  Fact Sheet for Healthcare Providers: IncredibleEmployment.be  This test is not yet approved or cleared by the [place] FDA and has been authorized for detection  and/or diagnosis of SARS-CoV-2 by FDA under an Emergency Use Authorization (EUA). This EUA will remain in effect (meaning this test can be used) for the duration of the COVID-19 declaration under Section 564(b)(1) of the Act, 21 U.S.C. section 360bbb-3(b)(1), unless the authorization is terminated or revoked.  Performed at [facility], [address], [place], [place] [address]          Radiology Studies: No results found.      Scheduled Meds: . amLODipine  10 mg Oral Daily  . aspirin  325 mg Oral QHS  . atorvastatin  80 mg Oral QHS  . ezetimibe  10 mg Oral Daily  . FLUoxetine  40 mg Oral Daily  . fluticasone furoate-vilanterol  1 puff Inhalation Daily  . furosemide  40 mg Intravenous BID  . heparin  5,000 Units Subcutaneous Q8H  . hydrochlorothiazide  12.5 mg Oral Daily  . losartan  100 mg Oral Daily  . metoprolol succinate  25 mg Oral Daily  . potassium chloride  40 mEq Oral Q3H  . sodium chloride flush  3 mL Intravenous Q12H   Continuous Infusions: . sodium chloride    . magnesium sulfate bolus IVPB       LOS: 2 days    Time spent: 38 minutes spent on chart review, discussion with nursing staff, consultants, updating family and interview/physical exam; more than 50% of that time was spent in counseling and/or coordination of care.    Atilla Zollner J [place] DO [facility] Available via Epic secure chat 7am-7pm After these hours, please refer to coverage provider listed on [email or web address] 07/09/2020, 10:14 AM

## 2020-07-09 NOTE — TOC Transition Note (Addendum)
Transition of Care [facility]) - CM/SW Discharge Note   Patient Details  Name: Patricia [name] MRN: [id number] Date of Birth: [birth year]  [facility] CM/SW Contact:  Zenon Mayo, RN Phone Number: 07/09/2020, 4:09 PM   Clinical Narrative:    NCM spoke with patient at bedside, she statesshe does not want [place] services.  She conts with diuresing today, plan for dc tomorrow per MD if stable , she has transportation thru grandson.  She states she does not need any DME at this time.   Final next level of care: Home/Self Care Barriers to Discharge: Continued Medical Work up   Patient Goals and CMS Choice Patient states their goals for this hospitalization and ongoing recovery are:: get better   Choice offered to / list presented to : NA  Discharge Placement                       Discharge Plan and Services                  DME Agency: NA       HH Arranged: NA          Social Determinants of Health (SDOH) Interventions     Readmission Risk Interventions No flowsheet data found.

## 2020-07-09 NOTE — Evaluation (Addendum)
Physical Therapy Evaluation Patient Details Name: Patricia [name] MRN: [id number] DOB: Sep 24, 1940 Today's Date: 07/09/2020   History of Present Illness  Patricia [name] is a 79 year old female with past medical history significant for CAD s/p CABG/PCI, COPD, chronic diastolic congestive heart failure, essential hypertension, peripheral artery disease, anal cancers s/p chemotherapy/radiation, carotid artery stenosis who presents with chest pressure and SOB. Admitted with acute on chronic decompensated diastolic CHF and HTN urgency.  Clinical Impression  Prior to admission, pt lives with her family in a level entry home and is independent. Pt presents with decreased functional mobility secondary to decreased cardiopulmonary endurance and mild balance deficits. Pt ambulating 350 feet with no assistive device at a supervision level. SpO2 87-91% on RA during ambulation, cues provided for activity pacing and pursed lip breathing. Upon return to room and seated rest break, SpO2 96%. Provided pt with incentive spirometer; pt pulling ~1250 ml. Will continue to follow acutely to promote mobility as tolerated.    Follow Up Recommendations No PT follow up;Supervision for mobility/OOB    Equipment Recommendations  None recommended by PT   Recommendations for Other Services       Precautions / Restrictions Precautions Precautions: Fall;Other (comment) Precaution Comments: SpO2 > 88% Restrictions Weight Bearing Restrictions: No      Mobility  Bed Mobility Overal bed mobility: Modified Independent             General bed mobility comments: HOB elevated    Transfers Overall transfer level: Independent Equipment used: None                Ambulation/Gait Ambulation/Gait assistance: Supervision Gait Distance (Feet): 350 Feet Assistive device: None Gait Pattern/deviations: Step-through pattern;Decreased stride length Gait velocity: decreased   General Gait Details: Mild dynamic  instability, supervision for safety  Stairs            Wheelchair Mobility    Modified Rankin (Stroke Patients Only)       Balance Overall balance assessment: Mild deficits observed, not formally tested                                           Pertinent Vitals/Pain Pain Assessment: No/denies pain    Home Living Family/patient expects to be discharged to:: Private residence Living Arrangements: Other relatives (grandson, grandson's wife, great granchild) Available Help at Discharge: Family Type of Home: House Home Access: Level entry     Home Layout: One level Home Equipment: None      Prior Function Level of Independence: Independent         Comments: Does not drive     Hand Dominance        Extremity/Trunk Assessment   Upper Extremity Assessment Upper Extremity Assessment: Defer to OT evaluation    Lower Extremity Assessment Lower Extremity Assessment: Overall WFL for tasks assessed       Communication   Communication: No difficulties  Cognition Arousal/Alertness: Awake/alert Behavior During Therapy: WFL for tasks assessed/performed Overall Cognitive Status: Within Functional Limits for tasks assessed                                        General Comments      Exercises Other Exercises Other Exercises: IS use x 5   Assessment/Plan    PT Assessment  Patient needs continued PT services  PT Problem List Decreased strength;Decreased activity tolerance;Decreased balance;Decreased mobility;Cardiopulmonary status limiting activity       PT Treatment Interventions Gait training;Functional mobility training;Therapeutic activities;Therapeutic exercise;Balance training;Patient/family education    PT Goals (Current goals can be found in the Care Plan section)  Acute Rehab PT Goals Patient Stated Goal: go home PT Goal Formulation: With patient Time For Goal Achievement: 07/23/20 Potential to Achieve Goals:  Good    Frequency Min 3X/week   Barriers to discharge        Co-evaluation               AM-PAC PT "6 Clicks" Mobility  Outcome Measure Help needed turning from your back to your side while in a flat bed without using bedrails?: None Help needed moving from lying on your back to sitting on the side of a flat bed without using bedrails?: None Help needed moving to and from a bed to a chair (including a wheelchair)?: None Help needed standing up from a chair using your arms (e.g., wheelchair or bedside chair)?: None Help needed to walk in hospital room?: None Help needed climbing 3-5 steps with a railing? : A Little 6 Click Score: 23    End of Session   Activity Tolerance: Patient tolerated treatment well Patient left: in chair;with call bell/phone within reach;with chair alarm set Nurse Communication: Mobility status PT Visit Diagnosis: Unsteadiness on feet (R26.81)    Time: 9470-9628 PT Time Calculation (min) (ACUTE ONLY): 28 min   Charges:   PT Evaluation $PT Eval Moderate Complexity: 1 Mod PT Treatments $Therapeutic Activity: 8-22 mins        Wyona Almas, PT, DPT Acute Rehabilitation Services Pager [phone] Office [phone]   Deno Etienne 07/09/2020, 9:08 AM

## 2020-07-10 LAB — BASIC METABOLIC PANEL
Anion gap: 8 (ref 5–15)
BUN: 18 mg/dL (ref 8–23)
CO2: 32 mmol/L (ref 22–32)
Calcium: 9.9 mg/dL (ref 8.9–10.3)
Chloride: 95 mmol/L — ABNORMAL LOW (ref 98–111)
Creatinine, Ser: 0.88 mg/dL (ref 0.44–1.00)
GFR, Estimated: >60 mL/min (ref >60)
Glucose, Bld: 102 mg/dL — ABNORMAL HIGH (ref 70–99)
Potassium: 3.9 mmol/L (ref 3.5–5.1)
Sodium: 135 mmol/L (ref 135–145)

## 2020-07-10 LAB — MAGNESIUM: Magnesium: 2.2 mg/dL (ref 1.7–2.4)

## 2020-07-10 MED ORDER — AMLODIPINE BESYLATE 10 MG PO TABS: 10.0000 mg | ORAL_TABLET | Freq: Every day | ORAL | 0 refills | Status: DC

## 2020-07-10 MED ORDER — BUDESONIDE-FORMOTEROL FUMARATE 160-4.5 MCG/ACT IN AERO: 2.0000 | INHALATION_SPRAY | Freq: Two times a day (BID) | RESPIRATORY_TRACT | 0 refills | Status: DC

## 2020-07-10 MED ORDER — HYDROCHLOROTHIAZIDE 12.5 MG PO CAPS: 12.5000 mg | ORAL_CAPSULE | Freq: Every day | ORAL | 0 refills | Status: DC

## 2020-07-10 MED ORDER — FUROSEMIDE 40 MG PO TABS: 40.0000 mg | ORAL_TABLET | Freq: Every day | ORAL | 0 refills | Status: DC

## 2020-07-10 NOTE — Progress Notes (Signed)
Physical Therapy Treatment Patient Details Name: Patricia [name] MRN: [id number] DOB: 1941/07/07 Today's Date: 07/10/2020    History of Present Illness Patricia [name] is a 79 year old female with past medical history significant for CAD s/p CABG/PCI, COPD, chronic diastolic congestive heart failure, essential hypertension, peripheral artery disease, anal cancers s/p chemotherapy/radiation, carotid artery stenosis who presents with chest pressure and SOB. Admitted with acute on chronic decompensated diastolic CHF and HTN urgency.    PT Comments    Pt progressing well towards her physical therapy goals. Ambulating 400 feet with no assistive device at a supervision level; SpO2 90-91% on RA. Education provided regarding IS use and fall prevention techniques in regards to home set up.     Follow Up Recommendations  No PT follow up;Supervision for mobility/OOB     Equipment Recommendations  None recommended by PT    Recommendations for Other Services       Precautions / Restrictions Precautions Precautions: Fall Precaution Comments: SpO2 > 88% Restrictions Weight Bearing Restrictions: No    Mobility  Bed Mobility Overal bed mobility: Independent                Transfers Overall transfer level: Independent Equipment used: None                Ambulation/Gait Ambulation/Gait assistance: Supervision Gait Distance (Feet): 400 Feet Assistive device: None Gait Pattern/deviations: Step-through pattern;Decreased stride length Gait velocity: decreased   General Gait Details: Mild dynamic instability, occasionally reaching for external objects for additional support. Supervision for safety   Stairs             Wheelchair Mobility    Modified Rankin (Stroke Patients Only)       Balance Overall balance assessment: Mild deficits observed, not formally tested                                          Cognition Arousal/Alertness:  Awake/alert Behavior During Therapy: WFL for tasks assessed/performed Overall Cognitive Status: Within Functional Limits for tasks assessed                                        Exercises      General Comments        Pertinent Vitals/Pain Pain Assessment: No/denies pain    Home Living                      Prior Function            PT Goals (current goals can now be found in the care plan section) Acute Rehab PT Goals Patient Stated Goal: go home Potential to Achieve Goals: Good Progress towards PT goals: Progressing toward goals    Frequency    Min 3X/week      PT Plan Current plan remains appropriate    Co-evaluation              AM-PAC PT "6 Clicks" Mobility   Outcome Measure  Help needed turning from your back to your side while in a flat bed without using bedrails?: None Help needed moving from lying on your back to sitting on the side of a flat bed without using bedrails?: None Help needed moving to and from a bed to a chair (including a wheelchair)?: None  Help needed standing up from a chair using your arms (e.g., wheelchair or bedside chair)?: None Help needed to walk in hospital room?: None Help needed climbing 3-5 steps with a railing? : A Little 6 Click Score: 23    End of Session   Activity Tolerance: Patient tolerated treatment well Patient left: in chair;with call bell/phone within reach Nurse Communication: Mobility status PT Visit Diagnosis: Unsteadiness on feet (R26.81)     Time: 0254-8628 PT Time Calculation (min) (ACUTE ONLY): 26 min  Charges:  $Therapeutic Activity: 23-37 mins                     Wyona Almas, PT, DPT [place] Pager [phone] Office [phone]    Deno Etienne 07/10/2020, 10:19 AM

## 2020-07-10 NOTE — Discharge Summary (Signed)
Physician Discharge Summary  Patricia [name] WSF:[id number] DOB: January 13, 1941 DOA: 07/06/2020  PCP: Colon Branch, MD  Admit date: 07/06/2020 Discharge date: 07/10/2020  Admitted From: Home Disposition: Home  Recommendations for Outpatient Follow-up:  1. Follow up with PCP in 1-2 weeks 2. Started on amlodipine 10 mg p.o. daily, hydrochlorothiazide 12.5 mg p.o. daily, furosemide 40 mg daily for hypertensive urgency and diastolic congestive heart failure exacerbation 3. Please obtain BMP in one week to continue assess renal function and potassium level after initiation of furosemide 4. Follow-up on daily weight log and adjust diuretic as needed  Home Health: None Equipment/Devices: None  Discharge Condition: Stable CODE STATUS: Full code Diet recommendation: Heart healthy diet  History of present illness:  Patricia [name] is a 79 year old female with past medical history significant for CAD s/p CABG/PCI, COPD, chronic diastolic congestive heart failure, essential hypertension, peripheral artery disease, anal cancers s/p chemotherapy/radiation, carotid artery stenosis who initially presented to [facility] with chest pressure and shortness of breath.  Patient reports dyspnea ongoing over the past few months but has been progressively worsening recently.  EMS was called and patient was noted to be hypoxic and placed on nonrebreather mask.  In the ED, temperature 97.8, HR 66, RR 13, BP 184/58, SPO2 98% on 6 L nasal cannula sodium 143, potassium 3.8, chloride 106, CO2 31, glucose 122, BUN 10, creatinine 0.61.  WBC count 4.8, hemoglobin 11.7, platelets 147.  BNP 865.9.  Troponin 30>41.  EKG with NSR, rate 48, no ST elevation/depression or T wave inversions, QTC 502.  Chest x-ray with retrocardiac opacity consistent with infectious versus inflammatory process.  CT angiogram chest negative for PE with small bilateral pleural effusions, bronchial wall thickening and cardiomegaly with  interstitial edema.  Patient was given IV Lasix.  Hospitalist service consulted for further evaluation and management.  Hospital course:  Acute respiratory failure with hypoxia Acute on chronic decompensated diastolic congestive heart failure Patient presenting with progressive shortness of breath over the past several months.  Was notably hypoxic on EMS arrival placed on nonrebreather.  On ED arrival, patient requiring 6 L nasal cannula to maintain SPO2.  Noted elevated BNP with CTA negative for PE but with cardiomegaly and interstitial edema.  Recent TTE 03/15/2020 with LVEF 59%, no regional wall motion abnormalities LV, grade 1 diastolic dysfunction.  Patient was started on IV diuresis with furosemide 40 mg IV every 12 hours with good urinary output and decreased weight from 49.2kg to 43.8 kg at time of discharge.  Patient was titrated off of supplemental oxygen.  IV furosemide was transitioned to furosemide 40 mg p.o. daily.  Patient instructed continued to maintain daily weight log to bring to next PCP visit.  Patient was continued on her home metoprolol succinate and losartan.  Patient was screened for desaturation on ambulation in which she maintain her oxygenation without issue.  Outpatient follow-up with PCP in 1 week.  Hypertensive urgency BP elevated up to 212/70 on admission.  Review of EMR since admission notes poorly controlled BP.  Patient states is compliant with her home regimen of metoprolol and losartan.  Patient started on amlodipine 10 mg p.o. daily and hydrochlorothiazide 12.5 mg p.o. daily.  Blood pressure was much better controlled with addition of furosemide.  She continued on her home metoprolol succinate 25 mg p.o. daily and losartan 100 mg p.o. daily.  BP 130/61 at time of discharge.  Patient instructed to maintain BP log to bring to next PCP visit for further  titration of antihypertensives as needed.  Hypokalemia Hypomagnesemia Repleted during hospitalization.  Potassium 3.9  and magnesium 2.2 at time of discharge.  Recommend BMP 1 week.  Elevated troponin secondary to type II demand ischemia Troponin slightly elevated 30>41.  EKG with no concerning signs of ischemic changes.  Etiology likely type II demand ischemia from hypertensive urgency and decompensated CHF as above.  QTC prolongation QTC 502 on EKG. no arrhythmias noted on telemetry during hospitalization.  Recommend avoidance of QTC prolonging medications  CAD status post CABG/PCI Peripheral artery disease Hx of branch retinal artery occlusion, right eye Follows with cardiology and retina specialist outpatient.  Continue home metoprolol succinate 25 mg p.o. daily, Losartan 100 mg p.o. daily, Aspirin 325 mg p.o. daily, Atorvastatin 80 mg p.o. daily, Ezetimibe 10 mg p.o. daily.  Outpatient follow-up with cardiology in ophthalmology as scheduled.  Carotid artery stenosis Carotid ultrasound with 1-39% bilateral ICA stenosis; with heavy plaque burden. Continue outpatient follow-up with cardiology with annual carotid ultrasound. Continue statin and aspirin  COPD, nonoxygen dependent Resumed home Symbicort.  Titrated off of submental oxygen as above.  Depression/anxiety: Continue home fluoxetine 40 mg p.o. daily  Right thyroid nodule CTA with finding of right thyroid nodule 2.5 cm.  Has been noted previously on PET scan by oncology 09/22/2019 with hypermetabolic mass right neck.  She has declined further evaluation citing a long history of thyroid nodules during oncology visits dated 01/16/2020.  Continue outpatient follow-up as desired.  Hx anal cancer s/p chemotherapy/radiation Follows with medical oncology, Dr. Benay Spice outpatient.  Discharge Diagnoses:  Active Problems:   Essential hypertension   COPD (chronic obstructive pulmonary disease) (HCC)   SOB (shortness of breath)    Discharge Instructions  Discharge Instructions    Call MD for:  difficulty breathing, headache or visual  disturbances   Complete by: As directed    Call MD for:  extreme fatigue   Complete by: As directed    Call MD for:  persistant dizziness or light-headedness   Complete by: As directed    Call MD for:  persistant nausea and vomiting   Complete by: As directed    Call MD for:  severe uncontrolled pain   Complete by: As directed    Call MD for:  temperature >100.4   Complete by: As directed    Diet - low sodium heart healthy   Complete by: As directed    Increase activity slowly   Complete by: As directed      Allergies as of 07/10/2020      Reactions   Ace Inhibitors Other (See Comments)   cough      Medication List    TAKE these medications   amLODipine 10 MG tablet Commonly known as: NORVASC Take 1 tablet (10 mg total) by mouth daily. What changed: when to take this   aspirin 325 MG tablet Take 325 mg by mouth at bedtime.   atorvastatin 80 MG tablet Commonly known as: LIPITOR Take 1 tablet (80 mg total) by mouth at bedtime.   budesonide-formoterol 160-4.5 MCG/ACT inhaler Commonly known as: SYMBICORT Inhale 2 puffs into the lungs 2 (two) times daily.   clonazePAM 0.5 MG tablet Commonly known as: KLONOPIN TAKE 1 TABLET BY MOUTH IN THE MORNING AS NEEDED FOR ANXIETY AND 1 & 1/2 TO 2 (ONE & ONE-HALF TO TWO) AT BEDTIME AS NEEDED FOR SLEEP What changed:   how much to take  how to take this  when to take this  reasons to take  this  additional instructions   ezetimibe 10 MG tablet Commonly known as: ZETIA Take 1 tablet (10 mg total) by mouth daily. What changed: when to take this   FLUoxetine 20 MG capsule Commonly known as: PROZAC Take 2 capsules (40 mg total) by mouth daily. What changed: when to take this   furosemide 40 MG tablet Commonly known as: Lasix Take 1 tablet (40 mg total) by mouth daily.   hydrochlorothiazide 12.5 MG capsule Commonly known as: MICROZIDE Take 1 capsule (12.5 mg total) by mouth daily. Start taking on: July 11, 2020    losartan 100 MG tablet Commonly known as: COZAAR Take 1 tablet by mouth once daily What changed: when to take this   metoprolol succinate 25 MG 24 hr tablet Commonly known as: Toprol XL Take 1 tablet (25 mg total) by mouth daily. What changed: when to take this   Vitamin D3 25 MCG (1000 UT) Caps Take 1,000 Units by mouth at bedtime.            Durable Medical Equipment  (From admission, onward)         Start     Ordered   07/09/20 1259  For home use only DME 3 n 1  Once        07/09/20 1258          Follow-up Information    Colon Branch, MD. Go on 07/16/2020.   Specialty: Internal Medicine Why: @11 :20am Contact information: [address] [place] [address] [phone]              Allergies  Allergen Reactions  . Ace Inhibitors Other (See Comments)    cough    Consultations:  None   Procedures/Studies: CT Angio Chest PE W and/or Wo Contrast  Result Date: 07/07/2020 CLINICAL DATA:  PE suspected. Shortness of breath. History of anal cancer. EXAM: CT ANGIOGRAPHY CHEST WITH CONTRAST TECHNIQUE: Multidetector CT imaging of the chest was performed using the standard protocol during bolus administration of intravenous contrast. Multiplanar CT image reconstructions and MIPs were obtained to evaluate the vascular anatomy. CONTRAST:  146mL OMNIPAQUE IOHEXOL 350 MG/ML SOLN COMPARISON:  CT chest dated October 16, 2017. FINDINGS: Cardiovascular: Contrast injection is sufficient to demonstrate satisfactory opacification of the pulmonary arteries to the segmental level. There is no pulmonary embolus or evidence of right heart strain. The size of the main pulmonary artery is normal. Cardiomegaly with coronary artery calcification. The course and caliber of the aorta are normal. There is mild atherosclerotic calcification. Opacification decreased due to pulmonary arterial phase contrast bolus timing. Mediastinum/Nodes: -- No mediastinal lymphadenopathy. --  No hilar lymphadenopathy. -- No axillary lymphadenopathy. -- No supraclavicular lymphadenopathy. --again noted is a large right-sided thyroid nodule measuring approximately 2.7 cm. -  Unremarkable esophagus. Lungs/Pleura: There are small bilateral pleural effusions with adjacent atelectasis, left worse than right. There is bronchial wall thickening mucous plugging bilateral lower lobes. The trachea is unremarkable. Emphysematous changes are noted. Upper Abdomen: Contrast bolus timing is not optimized for evaluation of the abdominal organs. There is a tiny hypoattenuating nodule in the left hepatic lobe that is too small to characterize. This is stable from prior study in January of 2021. A left adrenal mass is again noted. This is stable from prior study. Musculoskeletal: No chest wall abnormality. No bony spinal canal stenosis. Review of the MIP images confirms the above findings. IMPRESSION: 1. No evidence of pulmonary embolus. 2. Small bilateral pleural effusions with adjacent atelectasis,  left worse than right. 3. Bronchial wall thickening and mucous plugging bilateral lower lobes, can be seen in patients with reactive or infectious bronchiolitis. 4. Cardiomegaly with coronary artery disease. Interlobular septal thickening is noted suggestive of mild interstitial edema. 5. Right-sided thyroid nodule measuring approximately 2.7 cm. Recommend thyroid US as an outpatient.(Ref: J Am Coll Radiol. 2015 Feb;12(2): 143-50). Aortic Atherosclerosis (ICD10-I70.0) and Emphysema (ICD10-J43.9). Electronically Signed   By: Constance Holster M.D.   On: 07/07/2020 01:58   DG Chest Portable 1 View  Result Date: 07/06/2020 CLINICAL DATA:  Acute shortness of breath, COPD. EXAM: PORTABLE CHEST 1 VIEW COMPARISON:  Chest x-ray 05/29/2020, CT chest 10/16/2017 FINDINGS: The heart size and mediastinal contours are unchanged. Marked atherosclerotic plaque of the aorta. Cardiac surgical changes. Coarsened interstitial markings  flattening of the hemidiaphragms consistent with emphysema. Biapical pleural/pulmonary scarring again noted. Interval development of a retrocardiac opacity and possible trace left pleural effusion. Nonspecific blunting of the right costophrenic angle. No pulmonary edema. No pneumothorax. No acute osseous abnormality. IMPRESSION: 1. Interval development of a retrocardiac opacity and possible trace left pleural effusion. Findings could be infectious or inflammatory. 2. Aortic Atherosclerosis (ICD10-I70.0) and Emphysema (ICD10-J43.9). Electronically Signed   By: Iven Finn M.D.   On: 07/06/2020 23:29      Subjective: Patient seen and examined bedside, resting comfortably.  No complaints this morning and wishes to discharge home.  Denies headache, no fever/chills/night sweats, no nausea/vomiting/diarrhea, no chest pain, no palpitations, no shortness of breath, no abdominal pain, no weakness, no fatigue, no paresthesias.  No acute events overnight per nursing staff.  Discharge Exam: Vitals:   07/10/20 0402 07/10/20 0853  BP: 138/61 137/61  Pulse: 63 79  Resp: 19   Temp: 98.3 F (36.8 C) 97.7 F (36.5 C)  SpO2: 93% 94%   Vitals:   07/09/20 1946 07/10/20 0402 07/10/20 0405 07/10/20 0853  BP: 125/61 138/61  137/61  Pulse: 61 63  79  Resp: 18 19    Temp: 98.2 F (36.8 C) 98.3 F (36.8 C)  97.7 F (36.5 C)  TempSrc: Oral Oral  Oral  SpO2: 94% 93%  94%  Weight:   43.8 kg   Height:        General: Pt is alert, awake, not in acute distress Cardiovascular: RRR, S1/S2 +, no rubs, no gallops Respiratory: CTA bilaterally, no wheezing, no rhonchi, oxygenating well on room air Abdominal: Soft, NT, ND, bowel sounds + Extremities: no edema, no cyanosis    The results of significant diagnostics from this hospitalization (including imaging, microbiology, ancillary and laboratory) are listed below for reference.     Microbiology: Recent Results (from the past 240 hour(s))  Resp Panel by  RT-PCR (Flu A&B, Covid) Nasopharyngeal Swab     Status: None   Collection Time: 07/07/20  2:40 AM   Specimen: Nasopharyngeal Swab; Nasopharyngeal(NP) swabs in vial transport medium  Result Value Ref Range Status   SARS Coronavirus 2 by RT PCR NEGATIVE NEGATIVE Final    Comment: (NOTE) SARS-CoV-2 target nucleic acids are NOT DETECTED.  The SARS-CoV-2 RNA is generally detectable in upper respiratory specimens during the acute phase of infection. The lowest concentration of SARS-CoV-2 viral copies this assay can detect is 138 copies/mL. A negative result does not preclude SARS-Cov-2 infection and should not be used as the sole basis for treatment or other patient management decisions. A negative result may occur with  improper specimen collection/handling, submission of specimen other than nasopharyngeal swab, presence of viral mutation(s) within  the areas targeted by this assay, and inadequate number of viral copies(<138 copies/mL). A negative result must be combined with clinical observations, patient history, and epidemiological information. The expected result is Negative.  Fact Sheet for Patients:  EntrepreneurPulse.com.au  Fact Sheet for Healthcare Providers:  IncredibleEmployment.be  This test is no t yet approved or cleared by the [place] FDA and  has been authorized for detection and/or diagnosis of SARS-CoV-2 by FDA under an Emergency Use Authorization (EUA). This EUA will remain  in effect (meaning this test can be used) for the duration of the COVID-19 declaration under Section 564(b)(1) of the Act, 21 U.S.C.section 360bbb-3(b)(1), unless the authorization is terminated  or revoked sooner.       Influenza A by PCR NEGATIVE NEGATIVE Final   Influenza B by PCR NEGATIVE NEGATIVE Final    Comment: (NOTE) The Xpert Xpress SARS-CoV-2/FLU/RSV plus assay is intended as an aid in the diagnosis of influenza from Nasopharyngeal swab  specimens and should not be used as a sole basis for treatment. Nasal washings and aspirates are unacceptable for Xpert Xpress SARS-CoV-2/FLU/RSV testing.  Fact Sheet for Patients: EntrepreneurPulse.com.au  Fact Sheet for Healthcare Providers: IncredibleEmployment.be  This test is not yet approved or cleared by the [place] FDA and has been authorized for detection and/or diagnosis of SARS-CoV-2 by FDA under an Emergency Use Authorization (EUA). This EUA will remain in effect (meaning this test can be used) for the duration of the COVID-19 declaration under Section 564(b)(1) of the Act, 21 U.S.C. section 360bbb-3(b)(1), unless the authorization is terminated or revoked.  Performed at [facility], [address] [place], [place] [address]      Labs: BNP (last 3 results) Recent Labs    07/06/20 2358  BNP 629.5*   Basic Metabolic Panel: Recent Labs  Lab 07/06/20 2358 07/07/20 1504 07/07/20 1714 07/09/20 0115 07/10/20 0409  NA 143  --  142 137 135  K 3.8  --  3.7 3.2* 3.9  CL 106  --  102 97* 95*  CO2 31  --  30 32 32  GLUCOSE 122*  --  101* 106* 102*  BUN 10  --  5* 12 18  CREATININE 0.61 0.62 0.54 0.81 0.88  CALCIUM 8.8*  --  9.6 9.5 9.9  MG 2.0  --   --  1.8 2.2   Liver Function Tests: Recent Labs  Lab 07/07/20 1714  AST 23  ALT 17  ALKPHOS 127*  BILITOT 0.7  PROT 6.0*  ALBUMIN 3.3*   No results for input(s): LIPASE, AMYLASE in the last 168 hours. No results for input(s): AMMONIA in the last 168 hours. CBC: Recent Labs  Lab 07/06/20 2358 07/07/20 1504  WBC 4.8 8.4  NEUTROABS 3.6  --   HGB 11.7* 13.0  HCT 36.4 41.0  MCV 96.8 95.6  PLT 147* 155   Cardiac Enzymes: No results for input(s): CKTOTAL, CKMB, CKMBINDEX, TROPONINI in the last 168 hours. BNP: Invalid input(s): POCBNP CBG: No results for input(s): GLUCAP in the last 168 hours. D-Dimer No results for input(s): DDIMER in the last 72  hours. Hgb A1c No results for input(s): HGBA1C in the last 72 hours. Lipid Profile No results for input(s): CHOL, HDL, LDLCALC, TRIG, CHOLHDL, LDLDIRECT in the last 72 hours. Thyroid function studies No results for input(s): TSH, T4TOTAL, T3FREE, THYROIDAB in the last 72 hours.  Invalid input(s): FREET3 Anemia work up No results for input(s): VITAMINB12, FOLATE, FERRITIN, TIBC, IRON, RETICCTPCT in the  last 72 hours. Urinalysis    Component Value Date/Time   COLORURINE YELLOW 12/04/2014 1346   APPEARANCEUR CLEAR 12/04/2014 1346   LABSPEC >=1.030 (A) 12/04/2014 1346   PHURINE 5.5 12/04/2014 1346   GLUCOSEU NEGATIVE 12/04/2014 1346   HGBUR NEGATIVE 12/04/2014 1346   BILIRUBINUR NEGATIVE 12/04/2014 1346   BILIRUBINUR neg 09/16/2014 1503   KETONESUR NEGATIVE 12/04/2014 1346   PROTEINUR neg 09/16/2014 1503   PROTEINUR NEGATIVE 12/13/2013 1605   UROBILINOGEN 0.2 12/04/2014 1346   NITRITE POSITIVE (A) 12/04/2014 1346   LEUKOCYTESUR TRACE (A) 12/04/2014 1346   Sepsis Labs Invalid input(s): PROCALCITONIN,  WBC,  LACTICIDVEN Microbiology Recent Results (from the past 240 hour(s))  Resp Panel by RT-PCR (Flu A&B, Covid) Nasopharyngeal Swab     Status: None   Collection Time: 07/07/20  2:40 AM   Specimen: Nasopharyngeal Swab; Nasopharyngeal(NP) swabs in vial transport medium  Result Value Ref Range Status   SARS Coronavirus 2 by RT PCR NEGATIVE NEGATIVE Final    Comment: (NOTE) SARS-CoV-2 target nucleic acids are NOT DETECTED.  The SARS-CoV-2 RNA is generally detectable in upper respiratory specimens during the acute phase of infection. The lowest concentration of SARS-CoV-2 viral copies this assay can detect is 138 copies/mL. A negative result does not preclude SARS-Cov-2 infection and should not be used as the sole basis for treatment or other patient management decisions. A negative result may occur with  improper specimen collection/handling, submission of specimen other than  nasopharyngeal swab, presence of viral mutation(s) within the areas targeted by this assay, and inadequate number of viral copies(<138 copies/mL). A negative result must be combined with clinical observations, patient history, and epidemiological information. The expected result is Negative.  Fact Sheet for Patients:  EntrepreneurPulse.com.au  Fact Sheet for Healthcare Providers:  IncredibleEmployment.be  This test is no t yet approved or cleared by the [place] FDA and  has been authorized for detection and/or diagnosis of SARS-CoV-2 by FDA under an Emergency Use Authorization (EUA). This EUA will remain  in effect (meaning this test can be used) for the duration of the COVID-19 declaration under Section 564(b)(1) of the Act, 21 U.S.C.section 360bbb-3(b)(1), unless the authorization is terminated  or revoked sooner.       Influenza A by PCR NEGATIVE NEGATIVE Final   Influenza B by PCR NEGATIVE NEGATIVE Final    Comment: (NOTE) The Xpert Xpress SARS-CoV-2/FLU/RSV plus assay is intended as an aid in the diagnosis of influenza from Nasopharyngeal swab specimens and should not be used as a sole basis for treatment. Nasal washings and aspirates are unacceptable for Xpert Xpress SARS-CoV-2/FLU/RSV testing.  Fact Sheet for Patients: EntrepreneurPulse.com.au  Fact Sheet for Healthcare Providers: IncredibleEmployment.be  This test is not yet approved or cleared by the [place] FDA and has been authorized for detection and/or diagnosis of SARS-CoV-2 by FDA under an Emergency Use Authorization (EUA). This EUA will remain in effect (meaning this test can be used) for the duration of the COVID-19 declaration under Section 564(b)(1) of the Act, 21 U.S.C. section 360bbb-3(b)(1), unless the authorization is terminated or revoked.  Performed at [facility], [address] [place], [place]  [address]      Time coordinating discharge: Over 30 minutes  SIGNED:   Malka Bocek J [place] DO  [facility] 07/10/2020, 10:03 AM

## 2020-07-10 NOTE — Discharge Instructions (Signed)
Blood Pressure Record Sheet To take your blood pressure, you will need a blood pressure machine. You can buy a blood pressure machine (blood pressure monitor) at your clinic, drug store, or online. When choosing one, consider:  An automatic monitor that has an arm cuff.  A cuff that wraps snugly around your upper arm. You should be able to fit only one finger between your arm and the cuff.  A device that stores blood pressure reading results.  Do not choose a monitor that measures your blood pressure from your wrist or finger. Follow your health care provider's instructions for how to take your blood pressure. To use this form:  Get one reading in the morning (a.m.) before you take any medicines.  Get one reading in the evening (p.m.) before supper.  Take at least 2 readings with each blood pressure check. This makes sure the results are correct. Wait 1-2 minutes between measurements.  Write down the results in the spaces on this form.  Repeat this once a week, or as told by your health care provider.  Make a follow-up appointment with your health care provider to discuss the results. Blood pressure log Date: _______________________  a.m. _____________________(1st reading) _____________________(2nd reading)  p.m. _____________________(1st reading) _____________________(2nd reading) Date: _______________________  a.m. _____________________(1st reading) _____________________(2nd reading)  p.m. _____________________(1st reading) _____________________(2nd reading) Date: _______________________  a.m. _____________________(1st reading) _____________________(2nd reading)  p.m. _____________________(1st reading) _____________________(2nd reading) Date: _______________________  a.m. _____________________(1st reading) _____________________(2nd reading)  p.m. _____________________(1st reading) _____________________(2nd reading) Date: _______________________  a.m.  _____________________(1st reading) _____________________(2nd reading)  p.m. _____________________(1st reading) _____________________(2nd reading) This information is not intended to replace advice given to you by your health care provider. Make sure you discuss any questions you have with your health care provider. Document Revised: 09/25/2017 Document Reviewed: 07/28/2017 Elsevier Patient Education  [place]. Daily Weight Record It is important to weigh yourself daily. To do this:  Make sure you use a reliable scale. Use the same scale each day.  Keep this daily weight chart near your scale.  Weigh yourself each morning at the same time.  Before weighing yourself: ? Take off your shoes. ? Make sure you are wearing the same amount of clothing each day.  Write down your weight in the spaces on the form.  Compare today's weight to yesterday's weight.  Bring this form with you to your follow-up visits with your health care provider. Call your health care provider if you have concerns about your weight, including rapid weight gain or loss. Date: ________ Weight: ____________________ Date: ________ Weight: ____________________ Date: ________ Weight: ____________________ Date: ________ Weight: ____________________ Date: ________ Weight: ____________________ Date: ________ Weight: ____________________ Date: ________ Weight: ____________________ Date: ________ Weight: ____________________ Date: ________ Weight: ____________________ Date: ________ Weight: ____________________ Date: ________ Weight: ____________________ Date: ________ Weight: ____________________ Date: ________ Weight: ____________________ Date: ________ Weight: ____________________ Date: ________ Weight: ____________________ Date: ________ Weight: ____________________ Date: ________ Weight: ____________________ Date: ________ Weight: ____________________ Date: ________ Weight: ____________________ Date: ________  Weight: ____________________ Date: ________ Weight: ____________________ Date: ________ Weight: ____________________ Date: ________ Weight: ____________________ Date: ________ Weight: ____________________ Date: ________ Weight: ____________________ Date: ________ Weight: ____________________ Date: ________ Weight: ____________________ Date: ________ Weight: ____________________ Date: ________ Weight: ____________________ Date: ________ Weight: ____________________ Date: ________ Weight: ____________________ Date: ________ Weight: ____________________ Date: ________ Weight: ____________________ Date: ________ Weight: ____________________ Date: ________ Weight: ____________________ Date: ________ Weight: ____________________ Date: ________ Weight: ____________________ Date: ________ Weight: ____________________ Date: ________ Weight: ____________________ Date: ________ Weight: ____________________ Date: ________ Weight: ____________________ Date: ________ Weight:  ____________________ Date: ________ Weight: ____________________ Date: ________ Weight: ____________________ Date: ________ Weight: ____________________ Date: ________ Weight: ____________________ Date: ________ Weight: ____________________ Date: ________ Weight: ____________________ Date: ________ Weight: ____________________ Date: ________ Weight: ____________________ This information is not intended to replace advice given to you by your health care provider. Make sure you discuss any questions you have with your health care provider. Document Revised: 07/27/2017 Document Reviewed: 07/27/2017 Elsevier Patient Education  2020 [place] Your Hypertension Hypertension is commonly called high blood pressure. This is when the force of your blood pressing against the walls of your arteries is too strong. Arteries are blood vessels that carry blood from your heart throughout your body. Hypertension forces the heart to work harder  to pump blood, and may cause the arteries to become narrow or stiff. Having untreated or uncontrolled hypertension can cause heart attack, stroke, kidney disease, and other problems. What are blood pressure readings? A blood pressure reading consists of a higher number over a lower number. Ideally, your blood pressure should be below 120/80. The first ("top") number is called the systolic pressure. It is a measure of the pressure in your arteries as your heart beats. The second ("bottom") number is called the diastolic pressure. It is a measure of the pressure in your arteries as the heart relaxes. What does my blood pressure reading mean? Blood pressure is classified into four stages. Based on your blood pressure reading, your health care provider may use the following stages to determine what type of treatment you need, if any. Systolic pressure and diastolic pressure are measured in a unit called mm Hg. Normal  Systolic pressure: below 035.  Diastolic pressure: below 80. Elevated  Systolic pressure: 597-416.  Diastolic pressure: below 80. Hypertension stage 1  Systolic pressure: 384-536.  Diastolic pressure: 46-80. Hypertension stage 2  Systolic pressure: 321 or above.  Diastolic pressure: 90 or above. What health risks are associated with hypertension? Managing your hypertension is an important responsibility. Uncontrolled hypertension can lead to:  A heart attack.  A stroke.  A weakened blood vessel (aneurysm).  Heart failure.  Kidney damage.  Eye damage.  Metabolic syndrome.  Memory and concentration problems. What changes can I make to manage my hypertension? Hypertension can be managed by making lifestyle changes and possibly by taking medicines. Your health care provider will help you make a plan to bring your blood pressure within a normal range. Eating and drinking   Eat a diet that is high in fiber and potassium, and low in salt (sodium), added sugar, and  fat. An example eating plan is called the DASH (Dietary Approaches to Stop Hypertension) diet. To eat this way: ? Eat plenty of fresh fruits and vegetables. Try to fill half of your plate at each meal with fruits and vegetables. ? Eat whole grains, such as whole wheat pasta, brown rice, or whole grain bread. Fill about one quarter of your plate with whole grains. ? Eat low-fat diary products. ? Avoid fatty cuts of meat, processed or cured meats, and poultry with skin. Fill about one quarter of your plate with lean proteins such as fish, chicken without skin, beans, eggs, and tofu. ? Avoid premade and processed foods. These tend to be higher in sodium, added sugar, and fat.  Reduce your daily sodium intake. Most people with hypertension should eat less than 1,500 mg of sodium a day.  Limit alcohol intake to no more than 1 drink a day for nonpregnant women and 2 drinks a day for  men. One drink equals 12 oz of beer, 5 oz of wine, or 1 oz of hard liquor. Lifestyle  Work with your health care provider to maintain a healthy body weight, or to lose weight. Ask what an ideal weight is for you.  Get at least 30 minutes of exercise that causes your heart to beat faster (aerobic exercise) most days of the week. Activities may include walking, swimming, or biking.  Include exercise to strengthen your muscles (resistance exercise), such as weight lifting, as part of your weekly exercise routine. Try to do these types of exercises for 30 minutes at least 3 days a week.  Do not use any products that contain nicotine or tobacco, such as cigarettes and e-cigarettes. If you need help quitting, ask your health care provider.  Control any long-term (chronic) conditions you have, such as high cholesterol or diabetes. Monitoring  Monitor your blood pressure at home as told by your health care provider. Your personal target blood pressure may vary depending on your medical conditions, your age, and other  factors.  Have your blood pressure checked regularly, as often as told by your health care provider. Working with your health care provider  Review all the medicines you take with your health care provider because there may be side effects or interactions.  Talk with your health care provider about your diet, exercise habits, and other lifestyle factors that may be contributing to hypertension.  Visit your health care provider regularly. Your health care provider can help you create and adjust your plan for managing hypertension. Will I need medicine to control my blood pressure? Your health care provider may prescribe medicine if lifestyle changes are not enough to get your blood pressure under control, and if:  Your systolic blood pressure is 130 or higher.  Your diastolic blood pressure is 80 or higher. Take medicines only as told by your health care provider. Follow the directions carefully. Blood pressure medicines must be taken as prescribed. The medicine does not work as well when you skip doses. Skipping doses also puts you at risk for problems. Contact a health care provider if:  You think you are having a reaction to medicines you have taken.  You have repeated (recurrent) headaches.  You feel dizzy.  You have swelling in your ankles.  You have trouble with your vision. Get help right away if:  You develop a severe headache or confusion.  You have unusual weakness or numbness, or you feel faint.  You have severe pain in your chest or abdomen.  You vomit repeatedly.  You have trouble breathing. Summary  Hypertension is when the force of blood pumping through your arteries is too strong. If this condition is not controlled, it may put you at risk for serious complications.  Your personal target blood pressure may vary depending on your medical conditions, your age, and other factors. For most people, a normal blood pressure is less than 120/80.  Hypertension is  managed by lifestyle changes, medicines, or both. Lifestyle changes include weight loss, eating a healthy, low-sodium diet, exercising more, and limiting alcohol. This information is not intended to replace advice given to you by your health care provider. Make sure you discuss any questions you have with your health care provider. Document Revised: 11/19/2018 Document Reviewed: 06/25/2016 Elsevier Patient Education  [place]. Hypertension, Adult High blood pressure (hypertension) is when the force of blood pumping through the arteries is too strong. The arteries are the blood vessels that carry  blood from the heart throughout the body. Hypertension forces the heart to work harder to pump blood and may cause arteries to become narrow or stiff. Untreated or uncontrolled hypertension can cause a heart attack, heart failure, a stroke, kidney disease, and other problems. A blood pressure reading consists of a higher number over a lower number. Ideally, your blood pressure should be below 120/80. The first ("top") number is called the systolic pressure. It is a measure of the pressure in your arteries as your heart beats. The second ("bottom") number is called the diastolic pressure. It is a measure of the pressure in your arteries as the heart relaxes. What are the causes? The exact cause of this condition is not known. There are some conditions that result in or are related to high blood pressure. What increases the risk? Some risk factors for high blood pressure are under your control. The following factors may make you more likely to develop this condition:  Smoking.  Having type 2 diabetes mellitus, high cholesterol, or both.  Not getting enough exercise or physical activity.  Being overweight.  Having too much fat, sugar, calories, or salt (sodium) in your diet.  Drinking too much alcohol. Some risk factors for high blood pressure may be difficult or impossible to change. Some of  these factors include:  Having chronic kidney disease.  Having a family history of high blood pressure.  Age. Risk increases with age.  Race. You may be at higher risk if you are African American.  Gender. Men are at higher risk than women before age 57. After age 10, women are at higher risk than men.  Having obstructive sleep apnea.  Stress. What are the signs or symptoms? High blood pressure may not cause symptoms. Very high blood pressure (hypertensive crisis) may cause:  Headache.  Anxiety.  Shortness of breath.  Nosebleed.  Nausea and vomiting.  Vision changes.  Severe chest pain.  Seizures. How is this diagnosed? This condition is diagnosed by measuring your blood pressure while you are seated, with your arm resting on a flat surface, your legs uncrossed, and your feet flat on the floor. The cuff of the blood pressure monitor will be placed directly against the skin of your upper arm at the level of your heart. It should be measured at least twice using the same arm. Certain conditions can cause a difference in blood pressure between your right and left arms. Certain factors can cause blood pressure readings to be lower or higher than normal for a short period of time:  When your blood pressure is higher when you are in a health care provider's office than when you are at home, this is called white coat hypertension. Most people with this condition do not need medicines.  When your blood pressure is higher at home than when you are in a health care provider's office, this is called masked hypertension. Most people with this condition may need medicines to control blood pressure. If you have a high blood pressure reading during one visit or you have normal blood pressure with other risk factors, you may be asked to:  Return on a different day to have your blood pressure checked again.  Monitor your blood pressure at home for 1 week or longer. If you are diagnosed  with hypertension, you may have other blood or imaging tests to help your health care provider understand your overall risk for other conditions. How is this treated? This condition is treated by making healthy  lifestyle changes, such as eating healthy foods, exercising more, and reducing your alcohol intake. Your health care provider may prescribe medicine if lifestyle changes are not enough to get your blood pressure under control, and if:  Your systolic blood pressure is above 130.  Your diastolic blood pressure is above 80. Your personal target blood pressure may vary depending on your medical conditions, your age, and other factors. Follow these instructions at home: Eating and drinking   Eat a diet that is high in fiber and potassium, and low in sodium, added sugar, and fat. An example eating plan is called the DASH (Dietary Approaches to Stop Hypertension) diet. To eat this way: ? Eat plenty of fresh fruits and vegetables. Try to fill one half of your plate at each meal with fruits and vegetables. ? Eat whole grains, such as whole-wheat pasta, brown rice, or whole-grain bread. Fill about one fourth of your plate with whole grains. ? Eat or drink low-fat dairy products, such as skim milk or low-fat yogurt. ? Avoid fatty cuts of meat, processed or cured meats, and poultry with skin. Fill about one fourth of your plate with lean proteins, such as fish, chicken without skin, beans, eggs, or tofu. ? Avoid pre-made and processed foods. These tend to be higher in sodium, added sugar, and fat.  Reduce your daily sodium intake. Most people with hypertension should eat less than 1,500 mg of sodium a day.  Do not drink alcohol if: ? Your health care provider tells you not to drink. ? You are pregnant, may be pregnant, or are planning to become pregnant.  If you drink alcohol: ? Limit how much you use to:  0-1 drink a day for women.  0-2 drinks a day for men. ? Be aware of how much alcohol  is in your drink. In the [place] one drink equals one 12 oz bottle of beer (355 mL), one 5 oz glass of wine (148 mL), or one 1 oz glass of hard liquor (44 mL). Lifestyle   Work with your health care provider to maintain a healthy body weight or to lose weight. Ask what an ideal weight is for you.  Get at least 30 minutes of exercise most days of the week. Activities may include walking, swimming, or biking.  Include exercise to strengthen your muscles (resistance exercise), such as Pilates or lifting weights, as part of your weekly exercise routine. Try to do these types of exercises for 30 minutes at least 3 days a week.  Do not use any products that contain nicotine or tobacco, such as cigarettes, e-cigarettes, and chewing tobacco. If you need help quitting, ask your health care provider.  Monitor your blood pressure at home as told by your health care provider.  Keep all follow-up visits as told by your health care provider. This is important. Medicines  Take over-the-counter and prescription medicines only as told by your health care provider. Follow directions carefully. Blood pressure medicines must be taken as prescribed.  Do not skip doses of blood pressure medicine. Doing this puts you at risk for problems and can make the medicine less effective.  Ask your health care provider about side effects or reactions to medicines that you should watch for. Contact a health care provider if you:  Think you are having a reaction to a medicine you are taking.  Have headaches that keep coming back (recurring).  Feel dizzy.  Have swelling in your ankles.  Have trouble with your vision. Get help  right away if you:  Develop a severe headache or confusion.  Have unusual weakness or numbness.  Feel faint.  Have severe pain in your chest or abdomen.  Vomit repeatedly.  Have trouble breathing. Summary  Hypertension is when the force of blood pumping through your arteries is too  strong. If this condition is not controlled, it may put you at risk for serious complications.  Your personal target blood pressure may vary depending on your medical conditions, your age, and other factors. For most people, a normal blood pressure is less than 120/80.  Hypertension is treated with lifestyle changes, medicines, or a combination of both. Lifestyle changes include losing weight, eating a healthy, low-sodium diet, exercising more, and limiting alcohol. This information is not intended to replace advice given to you by your health care provider. Make sure you discuss any questions you have with your health care provider. Document Revised: 04/07/2018 Document Reviewed: 04/07/2018 Elsevier Patient Education  2020 [employer].

## 2020-07-11 ENCOUNTER — Telehealth: Payer: Self-pay

## 2020-07-11 NOTE — Telephone Encounter (Signed)
1st attempt TCM call. No answer. 

## 2020-07-12 ENCOUNTER — Telehealth: Payer: Self-pay

## 2020-07-12 NOTE — Telephone Encounter (Signed)
Transition Care Management Follow-up Telephone Call  Date of discharge and from where: 07/10/20-[facility]  How have you been since you were released from the hospital? Good  Any questions or concerns? No  Items Reviewed:  Did the pt receive and understand the discharge instructions provided? Yes   Medications obtained and verified? Yes   Other? Yes   Any new allergies since your discharge? No   Dietary orders reviewed? Yes  Do you have support at home? Yes   Home Care and Equipment/Supplies: Were home health services ordered? no If so, what is the name of the agency? n/a  Has the agency set up a time to come to the patient's home? not applicable Were any new equipment or medical supplies ordered?  No What is the name of the medical supply agency? n/a Were you able to get the supplies/equipment? not applicable Do you have any questions related to the use of the equipment or supplies? No  Functional Questionnaire: (I = Independent and D = Dependent) ADLs: I  Bathing/Dressing- I  Meal Prep- I  Eating- I  Maintaining continence- I  Transferring/Ambulation- I  Managing Meds- I  Follow up appointments reviewed:   PCP Hospital f/u appt confirmed? Yes  Scheduled to see Dr. Larose Kells on 07/16/20 @ 11:20.  [facility] f/u appt confirmed? N/A   Are transportation arrangements needed? No   If their condition worsens, is the pt aware to call PCP or go to the Emergency Dept.? Yes  Was the patient provided with contact information for the PCP's office or ED? Yes  Was to pt encouraged to call back with questions or concerns? Yes

## 2020-07-16 ENCOUNTER — Other Ambulatory Visit: Payer: Self-pay

## 2020-07-16 ENCOUNTER — Ambulatory Visit (INDEPENDENT_AMBULATORY_CARE_PROVIDER_SITE_OTHER): Payer: Medicare Other | Admitting: Internal Medicine

## 2020-07-16 VITALS — BP 115/55 | HR 66 | Temp 97.6°F | Ht 62.0 in | Wt 95.0 lb

## 2020-07-16 DIAGNOSIS — J449 Chronic obstructive pulmonary disease, unspecified: Secondary | ICD-10-CM

## 2020-07-16 DIAGNOSIS — R0902 Hypoxemia: Secondary | ICD-10-CM | POA: Diagnosis not present

## 2020-07-16 DIAGNOSIS — E041 Nontoxic single thyroid nodule: Secondary | ICD-10-CM

## 2020-07-16 DIAGNOSIS — I5033 Acute on chronic diastolic (congestive) heart failure: Secondary | ICD-10-CM

## 2020-07-16 DIAGNOSIS — I10 Essential (primary) hypertension: Secondary | ICD-10-CM | POA: Diagnosis not present

## 2020-07-16 LAB — CBC WITH DIFFERENTIAL/PLATELET
Basophils Absolute: 0.1 10*3/uL (ref 0.0–0.1)
Basophils Relative: 0.7 % (ref 0.0–3.0)
Eosinophils Absolute: 0.1 10*3/uL (ref 0.0–0.7)
Eosinophils Relative: 0.8 % (ref 0.0–5.0)
HCT: 42.6 % (ref 36.0–46.0)
Hemoglobin: 14.3 g/dL (ref 12.0–15.0)
Lymphocytes Relative: 15.9 % (ref 12.0–46.0)
Lymphs Abs: 1.5 10*3/uL (ref 0.7–4.0)
MCHC: 33.6 g/dL (ref 30.0–36.0)
MCV: 93.7 fl (ref 78.0–100.0)
Monocytes Absolute: 0.8 10*3/uL (ref 0.1–1.0)
Monocytes Relative: 8.6 % (ref 3.0–12.0)
Neutro Abs: 6.9 10*3/uL (ref 1.4–7.7)
Neutrophils Relative %: 74 % (ref 43.0–77.0)
Platelets: 262 10*3/uL (ref 150.0–400.0)
RBC: 4.55 Mil/uL (ref 3.87–5.11)
RDW: 13.9 % (ref 11.5–15.5)
WBC: 9.4 10*3/uL (ref 4.0–10.5)

## 2020-07-16 LAB — COMPREHENSIVE METABOLIC PANEL
ALT: 16 U/L (ref 0–35)
AST: 17 U/L (ref 0–37)
Albumin: 4.1 g/dL (ref 3.5–5.2)
Alkaline Phosphatase: 145 U/L — ABNORMAL HIGH (ref 39–117)
BUN: 47 mg/dL — ABNORMAL HIGH (ref 6–23)
CO2: 40 mEq/L — ABNORMAL HIGH (ref 19–32)
Calcium: 10.5 mg/dL (ref 8.4–10.5)
Chloride: 91 mEq/L — ABNORMAL LOW (ref 96–112)
Creatinine, Ser: 1.25 mg/dL — ABNORMAL HIGH (ref 0.40–1.20)
GFR: 40.74 mL/min — ABNORMAL LOW (ref >60.00)
Glucose, Bld: 116 mg/dL — ABNORMAL HIGH (ref 70–99)
Potassium: 3.4 mEq/L — ABNORMAL LOW (ref 3.5–5.1)
Sodium: 141 mEq/L (ref 135–145)
Total Bilirubin: 0.5 mg/dL (ref 0.2–1.2)
Total Protein: 6.8 g/dL (ref 6.0–8.3)

## 2020-07-16 LAB — MAGNESIUM: Magnesium: 2.3 mg/dL (ref 1.5–2.5)

## 2020-07-16 NOTE — Progress Notes (Signed)
Subjective:    Patient ID: Patricia [name], female    DOB: 04-27-1941, 79 y.o.   MRN: [id number]  DOS:  07/16/2020 Type of visit - description: Hospital follow-up, TCM, here with [name], her niece  Admitted to the hospital 07/06/2020, discharge 4 days later:  Admitting symptoms-- chest pain and shortness of breath.  Difficulty breathing progressively worse.  On arrival to the emergency room she was hypoxic was placed on a nonrebreather. CT chest showed no PE, some bronchial wall thickening and cardiomegaly, + interstitial edema.  DXs: Acute respiratory failure with hypoxia Acute on chronic diastolic CHF. She was given IV diuresis, wt decreased approximately 11 pounds   HTN elevated upon admission to 212/70.  Started amlodipine and HCTZ, subsequently Lasix was added  Had hypomagnesemia, hypokalemia.  Review of Systems Since she left the hospital she is feeling well. No fever chills No chest pain or edema No cough. Still gets occasionally short of breath, O2 sat is 90 to 91%. Ambulatory BPs are good.  Past Medical History:  Diagnosis Date  . Abnormal LFTs   . Allergic rhinitis   . Anxiety and depression    son commited suicide 2010  . 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 Branch retinal artery occlusion    Echocardiogram 8/21:  EF 59, no RWMA, mild LVH, Gr 1 DD, normal RVSF, mild MR [no intracardiac source of embolism]  . CAD (coronary artery disease)    s/p CABG  . Cancer [facility])    anal  . Carotid artery disease ([place])    Moderate - f/u dopplers needed 11/2012  . 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 Zadie Rhine  . COPD (chronic obstructive pulmonary disease) ([place])   . HTN (hypertension)    intolerant to ACEi (cough)  . Hyperlipidemia   . Osteopenia   . PAD (peripheral artery disease) ([place])    Carotid dz as  below, and at [facility] 11/12:  critical stenosis noted just below the sheath site leading into a totally occluded SFA  and a patent deep femoral artery  . Panic attacks   . Thyroid nodule     Past Surgical History:  Procedure Laterality Date  . BIOPSY  09/07/2019   Procedure: BIOPSY;  Surgeon: Jackquline Denmark, MD;  Location: [facility];  Service: Gastroenterology;;  . CORONARY ANGIOPLASTY WITH STENT PLACEMENT     "1; makes total of 3" (07/05/2012)  . CORONARY ARTERY BYPASS GRAFT N/A 12/15/2013   Procedure: CORONARY ARTERY BYPASS GRAFTING (CABG);  Surgeon: Gaye Pollack, MD;  Location: [place];  Service: Open Heart Surgery;  Laterality: N/A;  Times 3 using left internal mammary artery and endoscopically harvested right saphenous vein   . FLEXIBLE SIGMOIDOSCOPY N/A 09/07/2019   Procedure: FLEXIBLE SIGMOIDOSCOPY;  Surgeon: Jackquline Denmark, MD;  Location: [facility];  Service: Gastroenterology;  Laterality: N/A;  . INTRAOPERATIVE TRANSESOPHAGEAL ECHOCARDIOGRAM N/A 12/15/2013   Procedure: INTRAOPERATIVE TRANSESOPHAGEAL ECHOCARDIOGRAM;  Surgeon: Gaye Pollack, MD;  Location: [facility] OR;  Service: Open Heart Surgery;  Laterality: N/A;  . 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 LEFT HEART CATHETERIZATION WITH CORONARY ANGIOGRAM N/A 07/02/2011   Procedure: LEFT HEART CATHETERIZATION WITH CORONARY ANGIOGRAM;  Surgeon: Sherren Mocha, MD;  Location: [facility] CATH LAB;  Service: Cardiovascular;  Laterality: N/A;  . LEFT HEART CATHETERIZATION WITH CORONARY  ANGIOGRAM N/A 07/05/2012   Procedure: LEFT HEART CATHETERIZATION WITH CORONARY ANGIOGRAM;  Surgeon: Sherren Mocha, MD;  Location: [facility] CATH LAB;  Service: Cardiovascular;  Laterality: N/A;  . LEFT HEART CATHETERIZATION WITH CORONARY ANGIOGRAM N/A 11/28/2013   Procedure: LEFT HEART CATHETERIZATION WITH CORONARY ANGIOGRAM;  Surgeon: Blane Ohara, MD;  Location: [facility] C[facility] LAB;  Service: Cardiovascular;  Laterality: N/A;  . PERCUTANEOUS CORONARY INTERVENTION-BALLOON ONLY  07/02/2011    Procedure: PERCUTANEOUS CORONARY INTERVENTION-BALLOON ONLY;  Surgeon: Sherren Mocha, MD;  Location: [facility] CATH LAB;  Service: Cardiovascular;;  . PERCUTANEOUS CORONARY INTERVENTION-BALLOON ONLY  07/05/2012   Procedure: PERCUTANEOUS CORONARY INTERVENTION-BALLOON ONLY;  Surgeon: Sherren Mocha, MD;  Location[facility];  Service: Cardiovascular;;  . VAGINAL HYSTERECTOMY  ~ 1976   no oophorectomy    Allergies as of 07/16/2020      Reactions   Ace Inhibitors Other (See Comments)   cough      Medication List       Accurate as of July 16, 2020 11:59 PM. If you have any questions, ask your nurse or doctor.        amLODipine 10 MG tablet Commonly known as: NORVASC Take 1 tablet (10 mg total) by mouth daily.   aspirin 325 MG tablet Take 325 mg by mouth at bedtime.   atorvastatin 80 MG tablet Commonly known as: LIPITOR Take 1 tablet (80 mg total) by mouth at bedtime.   budesonide-formoterol 160-4.5 MCG/ACT inhaler Commonly known as: SYMBICORT Inhale 2 puffs into the lungs 2 (two) times daily.   clonazePAM 0.5 MG tablet Commonly known as: KLONOPIN TAKE 1 TABLET BY MOUTH IN THE MORNING AS NEEDED FOR ANXIETY AND 1 & 1/2 TO 2 (ONE & ONE-HALF TO TWO) AT BEDTIME AS NEEDED FOR SLEEP What changed:   how much to take  how to take this  when to take this  reasons to take this  additional instructions   ezetimibe 10 MG tablet Commonly known as: ZETIA Take 1 tablet (10 mg total) by mouth daily. What changed: when to take this   FLUoxetine 20 MG capsule Commonly known as: PROZAC Take 2 capsules (40 mg total) by mouth daily. What changed: when to take this   hydrochlorothiazide 12.5 MG capsule Commonly known as: MICROZIDE Take 1 capsule (12.5 mg total) by mouth daily.   Lasix 40 MG tablet Generic drug: furosemide Take 0.5 tablets (20 mg total) by mouth daily. What changed: how much to take Changed by: Kathlene November, MD   losartan 100 MG tablet Commonly known as:  COZAAR Take 1 tablet by mouth once daily What changed: when to take this   metoprolol succinate 25 MG 24 hr tablet Commonly known as: Toprol XL Take 1 tablet (25 mg total) by mouth daily. What changed: when to take this   Vitamin D3 25 MCG (1000 UT) Caps Take 1,000 Units by mouth at bedtime.          Objective:   Physical Exam BP (!) 115/55 (BP Location: Right Arm, Patient Position: Sitting, Cuff Size: Large)   Pulse 66   Temp 97.6 F (36.4 C) (Oral)   Ht 5\' 2"  (1.575 m)   Wt 95 lb (43.1 kg)   SpO2 90%   BMI 17.38 kg/m  General:   Well developed, NAD, BMI noted. HEENT:  Normocephalic . Face symmetric, atraumatic Lungs:  Severely decreased breath sounds Normal respiratory effort, no intercostal retractions, no accessory muscle use. Heart: RRR,  no murmur.  Lower extremities: no pretibial edema  bilaterally  Skin: Not pale. Not jaundice Neurologic:  alert & oriented X3.  Speech normal, gait appropriate for age and unassisted Psych--  Cognition and judgment appear intact.  Cooperative with normal attention span and concentration.  Behavior appropriate. No anxious or depressed appearing.      Assessment     Assessment   Hyperglycemia, A1c 5.7 HTN Hyperlipidemia Anxiety depression COPD , quit tobacco 2015 DJD CV: --CAD,cath 2012, 2013, CABG 2015 --PAD -   ABIs 2012, 07-2015 : Stable moderate disease left, normal right  --Carotid disease   --Central Retinal vein occlusion 02-2015 --Palpable aorta US abdomen 06-2015 no AA   Increase LFTs, elevated alkaline phosphatase , [place] abd neg 06-2015  Thyroid nodule --[place] 2012 stable (since 2009) Anal cancer:  DX 08-2019, status post chemotherapy.  On clinical remission as of 01/2020   PLAN TCM 7 Respiratory failure with hypoxia, acute on chronic diastolic CHF: Admitted to the hospital, now back at home, overall doing well, weight at home is a stable at about 90 pounds on her scales. She still has some shortness of  breath and O2 sat is barely in the 90s, she is not in any O2 supplements. Plan: CMP, CBC, Mg HTN: Was quite elevated during the hospital admission. -Her preadmission regimen included amlodipine 10 mg, losartan 100 mg, potassium. -Current regimen includes: Amlodipine 10 mg, losartan 100 mg, metoprolol, Lasix 40 mg, HCTZ 12.5 mg.  Not on potassium  BP today is very good, at home is 135/53 on average.  No change. COPD: On Symbicort, no cough, O2 sat remain in the low side, she is able to walk with some assistance without increased work of breathing.  Refer to pulmonary. Thyroid nodule: Discussed by the hospital team recently, she has declined further evaluation consistently. Preventive care: Had a flu shot, plans to get her Covid booster. Social: Lives at home with her grandson and his family.  Has good family support, [name] her niece is here with her today. RTC 3 months   This visit occurred during the SARS-CoV-2 public health emergency.  Safety protocols were in place, including screening questions prior to the visit, additional usage of staff PPE, and extensive cleaning of exam room while observing appropriate contact time as indicated for disinfecting solutions.

## 2020-07-16 NOTE — Patient Instructions (Addendum)
Please call the pulmonary clinic at your earliest convenience, you need to be seen there: [phone] I am placing a referral, the diagnosis is low oxygen and emphysema  GO TO THE LAB : Get the blood work     [place], [place] back for   a checkup in 3 months

## 2020-07-17 ENCOUNTER — Ambulatory Visit: Payer: Medicare Other | Admitting: Oncology

## 2020-07-17 ENCOUNTER — Telehealth: Payer: Self-pay | Admitting: *Deleted

## 2020-07-17 ENCOUNTER — Other Ambulatory Visit: Payer: Medicare Other

## 2020-07-17 NOTE — Telephone Encounter (Signed)
Called to cancel appointment today. Has been constipated and is working on getting bowels to move and she is not comfortable leaving the house. Informed her that she will be called to reschedule. Just a 6 month follow up, so not urgent.

## 2020-07-17 NOTE — Assessment & Plan Note (Signed)
TCM 7 Respiratory failure with hypoxia, acute on chronic diastolic CHF: Admitted to the hospital, now back at home, overall doing well, weight at home is a stable at about 90 pounds on her scales. She still has some shortness of breath and O2 sat is barely in the 90s, she is not in any O2 supplements. Plan: CMP, CBC, Mg HTN: Was quite elevated during the hospital admission. -Her preadmission regimen included amlodipine 10 mg, losartan 100 mg, potassium. -Current regimen includes: Amlodipine 10 mg, losartan 100 mg, metoprolol, Lasix 40 mg, HCTZ 12.5 mg.  Not on potassium  BP today is very good, at home is 135/53 on average.  No change. COPD: On Symbicort, no cough, O2 sat remain in the low side, she is able to walk with some assistance without increased work of breathing.  Refer to pulmonary. Thyroid nodule: Discussed by the hospital team recently, she has declined further evaluation consistently. Preventive care: Had a flu shot, plans to get her Covid booster. Social: Lives at home with her grandson and his family.  Has good family support, [name] her niece is here with her today. RTC 3 months

## 2020-07-18 ENCOUNTER — Telehealth: Payer: Self-pay | Admitting: Oncology

## 2020-07-18 NOTE — Telephone Encounter (Signed)
R/s appt per 12/7 sch msg - unable to reach pt. Left  Message for patient with appt date and time

## 2020-07-26 ENCOUNTER — Telehealth: Payer: Self-pay | Admitting: Pharmacist

## 2020-07-26 NOTE — Progress Notes (Addendum)
Chronic Care Management Pharmacy Assistant   Name: Patricia [name]  MRN: [id number] DOB: [birth year]  Reason for Encounter: HTN  Disease State  Patient Questions:  1.  Have you seen any other providers since your last visit? Yes  2.  Any changes in your medicines or health? Yes  PCP : Colon Branch, MD   Their chronic conditions include: Hypertension, Hyperlipidemia/CAD, Pre-Diabetes, Depression, Osteopenia, COPD  Office Visits: 07-16-2020 (PCP) Patient presented in the office for Ed f/u. Patient reports still having some shortness of breath with home O2 saturation barely in the 90's. Pulmonary referral placed. Lab work ordered. Medication changes: furosemide (LASIX) 20 MG tablet; one tab daily.  Hospitalizations: 07-06-2020- Patient presented in the ED c/o shortness of breath. Her BP was elevated during hospital stay at 212/70, 174/57, 167/56. Patent was found to be in acute respiratory failure with hypoxia and was admitted to [facility] from 07-06-20 to 07-10-2020. Medication changes include: furosemide (LASIX) 40 MG tablet; one tab daily. hydrochlorothiazide (MICROZIDE) 12.5 MG capsule; one cap daily.   Allergies:   Allergies  Allergen Reactions   Ace Inhibitors Other (See Comments)    cough    Medications: Outpatient Encounter Medications as of 07/26/2020  Medication Sig Note   amLODipine (NORVASC) 10 MG tablet Take 1 tablet (10 mg total) by mouth daily.    aspirin 325 MG tablet Take 325 mg by mouth at bedtime.     atorvastatin (LIPITOR) 80 MG tablet Take 1 tablet (80 mg total) by mouth at bedtime.    budesonide-formoterol (SYMBICORT) 160-4.5 MCG/ACT inhaler Inhale 2 puffs into the lungs 2 (two) times daily.    Cholecalciferol (VITAMIN D3) 1000 UNITS CAPS Take 1,000 Units by mouth at bedtime.     clonazePAM (KLONOPIN) 0.5 MG tablet TAKE 1 TABLET BY MOUTH IN THE MORNING AS NEEDED FOR ANXIETY AND 1 & 1/2 TO 2 (ONE & ONE-HALF TO TWO) AT BEDTIME AS NEEDED FOR SLEEP (Patient  taking differently: Take 0.5 mg by mouth at bedtime as needed for anxiety (sleep). )    ezetimibe (ZETIA) 10 MG tablet Take 1 tablet (10 mg total) by mouth daily. (Patient taking differently: Take 10 mg by mouth at bedtime. )    FLUoxetine (PROZAC) 20 MG capsule Take 2 capsules (40 mg total) by mouth daily. (Patient taking differently: Take 40 mg by mouth at bedtime. ) 07/07/2020: No recent fill hx per pharmacy records on reconcile outside info   furosemide (LASIX) 40 MG tablet Take 0.5 tablets (20 mg total) by mouth daily.    hydrochlorothiazide (MICROZIDE) 12.5 MG capsule Take 1 capsule (12.5 mg total) by mouth daily.    losartan (COZAAR) 100 MG tablet Take 1 tablet by mouth once daily (Patient taking differently: Take 100 mg by mouth at bedtime. )    metoprolol succinate (TOPROL XL) 25 MG 24 hr tablet Take 1 tablet (25 mg total) by mouth daily. (Patient taking differently: Take 25 mg by mouth at bedtime. )    No facility-administered encounter medications on file as of 07/26/2020.    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 01/23/2020  Port-A-Cath in place 09/26/2019   Anal cancer ([place]) 09/14/2019   Constipation    Rectal bleeding    Rectal lesion 09/06/2019   CAP (community acquired pneumonia) 05/15/2016   Right shoulder pain 12/26/2015   PCP NOTES >>>>>>>>>>>>>>> 12/13/2015   Retinal vein occlusion--Left dx 02-2015 03/20/2015   Recurrent UTI 06/27/2014   Hyperglycemia 01/25/2014   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   Annual physical exam 08/08/2011   PAD (peripheral artery disease) ([place])  07/21/2011   OTHER DYSPNEA AND RESPIRATORY ABNORMALITIES 07/02/2010   Coronary atherosclerosis of native coronary artery 07/02/2010   Allergic rhinitis 10/16/2009   Essential hypertension 07/20/2009   Anxiety,depression, insomnia 05/18/2009   SKIN LESION 01/12/2008   THYROID [place] ultrasound 2012, stable since 2009 04/26/2007   H/O tobacco use, presenting hazards to health 04/26/2007   Hyperlipidemia 01/25/2007   Carotid artery disease ([place]) 01/25/2007   COPD (chronic obstructive pulmonary disease) ([place]) 01/25/2007   Disorder of bone and cartilage 01/25/2007    Goals Addressed   None    Reviewed chart prior to disease state call. Spoke with patient regarding BP  Recent Office Vitals: BP Readings from Last 3 Encounters:  07/16/20 (!) 115/55  07/10/20 (!) 134/57  05/30/20 (!) 146/90   Pulse Readings from Last 3 Encounters:  07/16/20 66  07/10/20 64  05/30/20 77    Wt Readings from Last 3 Encounters:  07/16/20 95 lb (43.1 kg)  07/10/20 96 lb 9.6 oz (43.8 kg)  05/30/20 101 lb 9.6 oz (46.1 kg)     Kidney Function Lab Results  Component Value Date/Time   CREATININE 1.25 (H) 07/16/2020 12:12 PM   CREATININE 0.88 07/10/2020 04:09 AM   CREATININE 0.59 (L) 05/29/2020 01:50 PM   CREATININE 0.65 01/16/2020 02:53 PM   CREATININE 0.64 12/15/2019 02:51 PM   CREATININE 0.60 03/06/2017 03:04 PM   GFR 40.74 (L) 07/16/2020 12:12 PM   GFRNONAA >60 07/10/2020 04:09 AM   GFRNONAA >60 01/16/2020 02:53 PM   GFRAA >60 01/16/2020 02:53 PM    BMP Latest Ref Rng & Units 07/16/2020 07/10/2020 07/09/2020  Glucose 70 - 99 mg/dL 116(H) 102(H) 106(H)  BUN 6 - 23 mg/dL 47(H) 18 12  Creatinine 0.40 - 1.20 mg/dL 1.25(H) 0.88 0.81  BUN/Creat Ratio 6 - 22 (calc) - - -  Sodium 135 - 145 mEq/L 141 135 137  Potassium 3.5 - 5.1 mEq/L 3.4(L) 3.9 3.2(L)  Chloride 96 - 112 mEq/L 91(L) 95(L) 97(L)  CO2 19 - 32 mEq/L 40(H) 32 32  Calcium 8.4 - 10.5 mg/dL 10.5 9.9 9.5    Current  antihypertensive regimen:  Losartan 100 mg daily Amlodipine 10 mg daily noon Metoprolol Succinate 25 mg daily Lasix 20 mg daily HCTZ 12.5 mg daily  How often are you checking your Blood Pressure? daily Patient was asked to check her BP 2-3 times a week.   Current home BP readings: Patient states she writes her BP down but unable to locate the paper she wrote her readings on. She did say overall her BP has been stable and within normal range since her hospital discharge. She is happy with her recent numbers.   What recent interventions/DTPs have been made by any provider to improve Blood Pressure control since last CPP Visit: furosemide (LASIX) 40 MG tablet; one tab daily. hydrochlorothiazide (MICROZIDE) 12.5 MG capsule; one cap daily were added to her regimen by the ED. Dr Larose Kells later decreased Lasix dose to 20 mg daily.  Any recent hospitalizations or ED visits since  last visit with CPP? Yes   What diet changes have been made to improve Blood Pressure Control?  None  What exercise is being done to improve your Blood Pressure Control?  Does not much of an appetite  Adherence Review: Is the patient currently on ACE/ARB medication? Yes Losartan 100 mg daily Does the patient have >5 day gap between last estimated fill dates? No    Follow-Up:  Pharmacist Review   Fanny Skates, [place] Pharmacist Assistant [phone]  2 minutes spent in review, coordination, and documentation.   Reviewed by: De Blanch, PharmD, BCACP Clinical Pharmacist [facility] [phone]

## 2020-07-30 ENCOUNTER — Other Ambulatory Visit: Payer: Self-pay | Admitting: Internal Medicine

## 2020-07-31 ENCOUNTER — Other Ambulatory Visit: Payer: Self-pay

## 2020-07-31 ENCOUNTER — Other Ambulatory Visit (INDEPENDENT_AMBULATORY_CARE_PROVIDER_SITE_OTHER): Payer: Medicare Other

## 2020-07-31 DIAGNOSIS — I5033 Acute on chronic diastolic (congestive) heart failure: Secondary | ICD-10-CM

## 2020-07-31 LAB — BASIC METABOLIC PANEL
BUN: 11 mg/dL (ref 6–23)
CO2: 37 mEq/L — ABNORMAL HIGH (ref 19–32)
Calcium: 9.6 mg/dL (ref 8.4–10.5)
Chloride: 97 mEq/L (ref 96–112)
Creatinine, Ser: 0.65 mg/dL (ref 0.40–1.20)
GFR: 83.15 mL/min (ref >60.00)
Glucose, Bld: 116 mg/dL — ABNORMAL HIGH (ref 70–99)
Potassium: 3.3 mEq/L — ABNORMAL LOW (ref 3.5–5.1)
Sodium: 139 mEq/L (ref 135–145)

## 2020-07-31 NOTE — Telephone Encounter (Signed)
PDMP check, she gets occasional pain medication. Please call the patient, I refilled clonazepam but advised not to mix it with pain medication.

## 2020-07-31 NOTE — Telephone Encounter (Signed)
LMOM informing Pt of PCP recommendations. Instructed to call if questions/concerns.  

## 2020-07-31 NOTE — Telephone Encounter (Signed)
Requesting: clonazepam 0.5mg  Contract: 05/29/2020 UDS: 05/29/2020 Last Visit: 07/16/2020 Next Visit: 08/29/20 Last Refill: 05/25/2020 #60 and 0RF  Please Advise

## 2020-08-01 ENCOUNTER — Telehealth: Payer: Self-pay | Admitting: Internal Medicine

## 2020-08-01 MED ORDER — POTASSIUM CHLORIDE ER 10 MEQ PO TBCR: 10.0000 meq | EXTENDED_RELEASE_TABLET | Freq: Every day | ORAL | 0 refills | Status: DC

## 2020-08-01 NOTE — Telephone Encounter (Signed)
Please call patient: We recently decreased Lasix, labs came back better. 1. How is her blood pressure? 2.  Is she gaining weight or swelling up?Marland Kitchen Please let me know. 3.  Send KCl 10 mEq 1 tablet daily #30 no refills. 4.  Arrange a BMP in 10 days.

## 2020-08-01 NOTE — Telephone Encounter (Signed)
Called pt and left VM to call office back.

## 2020-08-01 NOTE — Telephone Encounter (Signed)
Noted, thank you

## 2020-08-01 NOTE — Telephone Encounter (Signed)
Please call pt back.

## 2020-08-01 NOTE — Telephone Encounter (Signed)
1. How is her blood pressure? - Patient states that she is not checking her blood pressure. 2.  Is she gaining weight or swelling up?Marland Kitchen Please let me know.- patient stated that she is not swelling nor gaining weight.  3.  Send KCl 10 mEq 1 tablet daily #30 no refills.- Sent in to her local pharmacy and patient advised.  4.  Arrange a BMP in 10 days- patient has hard time getting a ride will confirm with grandson and call back once she has a set date and time.

## 2020-08-02 ENCOUNTER — Other Ambulatory Visit: Payer: Self-pay

## 2020-08-06 ENCOUNTER — Telehealth: Payer: Self-pay | Admitting: Internal Medicine

## 2020-08-06 NOTE — Telephone Encounter (Signed)
Patient states she can no longer take the potassium medication  prescribed to her, patient states she was suppose to come in the have lab work drawn but she will not make it b/c she has not taken the medication .  Please advise

## 2020-08-06 NOTE — Telephone Encounter (Signed)
FYI

## 2020-08-06 NOTE — Telephone Encounter (Signed)
Reports that she took a tablet of potassium and immediately had diarrhea. No dysphagia or difficulty swallowing. Recommend to try potassium again (not clear if diarrhea was a side effect of the potassium supplement) and let me know

## 2020-08-22 ENCOUNTER — Telehealth: Payer: Self-pay | Admitting: Internal Medicine

## 2020-08-22 NOTE — Telephone Encounter (Signed)
Spoke w/ Pt- she had questions regarding potassium, she had restarted the potassium on 08/06/20 and wanted to know when she is due to recheck blood work. I informed that we were supposed to recheck 10 days after starting potassium, however she has transportation issues and has an appt on 08/29/20 w/ PCP. Informed we'd check blood work at that time. Pt verbalized understanding.

## 2020-08-22 NOTE — Telephone Encounter (Signed)
CallerJaylyne [name]  Call Back # [phone]  Patient is calling in reference to her prescription for potassium, patient states she has a few questions about it. Patient wants to know when she should come in to have lab work.   Please advise   Patient aware that she has an upcoming appointment on 01/19

## 2020-08-27 ENCOUNTER — Telehealth: Payer: Self-pay

## 2020-08-27 NOTE — Telephone Encounter (Signed)
Spoke w/ Pt- she is doing better only a minor cough per Pt, she is resting at home. Hasn't had a fever in about 24 hours. I offered her a virtual visit- she declined.

## 2020-08-27 NOTE — Telephone Encounter (Signed)
Noted, thank you

## 2020-08-27 NOTE — Telephone Encounter (Signed)
Pt advised by triage nurse to go to ER.   [facility] Night - Client TELEPHONE ADVICE RECORD AccessNurse Patient Name: Patricia [name] Gender: Female DOB: 1941/02/12 Age: 80 Y 69 M 30 D Return Phone Number: [phone] (Primary) Address: City/State/Zip: [place] [address] Client [facility] Night - Client Client Site [facility] - Night Physician Kathlene November - MD Contact Type Call Who Is Calling Patient / Member / Family / Caregiver Call Type Triage / Clinical Caller Name Herbie [place] Relationship To Patient Grandchild Return Phone Number [phone] (Primary) Chief Complaint Cough Reason for Call Symptomatic / Request for [place] states his grandmother is experiencing covid sx. She has a cough, runny nose and feels warm on her forehead. Her O2 is 90% Translation No Nurse Assessment Nurse: Ysidro Evert, RN, Levada Dy Date/Time (Eastern Time): 08/27/2020 12:39:45 PM Confirm and document reason for call. If symptomatic, describe symptoms. ---Caller states she has a cough and a runny nose that started today. She had exposure to covid with people at her home Does the patient have any new or worsening symptoms? ---Yes Will a triage be completed? ---Yes Related visit to physician within the last 2 weeks? ---No Does the PT have any chronic conditions? (i.e. diabetes, asthma, this includes High risk factors for pregnancy, etc.) ---Yes List chronic conditions. ---chf, emphysema, copd Is this a behavioral health or substance abuse call? ---No Guidelines Guideline Title Affirmed Question Affirmed Notes Nurse Date/Time ([place] Time) COVID-19 - Diagnosed or Suspected Patient sounds very sick or weak to the triager Ysidro Evert, RN, Levada Dy 08/27/2020 12:41:35 PM Disp. Time Eilene Ghazi Time) Disposition Final User 08/27/2020 12:46:33 PM Go to ED Now (or PCP triage) Yes Ysidro Evert, RN, Marin Shutter Disagree/Comply  Comply Caller Understands Yes PLEASE NOTE: All timestamps contained within this report are represented as [place] Standard Time. CONFIDENTIALTY NOTICE: This fax transmission is intended only for the addressee. It contains information that is legally privileged, confidential or otherwise protected from use or disclosure. If you are not the intended recipient, you are strictly prohibited from reviewing, disclosing, copying using or disseminating any of this information or taking any action in reliance on or regarding this information. If you have received this fax in error, please notify us immediately by telephone so that we can arrange for its return to [place]. Phone: [phone], Toll-Free: [phone], Fax: [phone] Page: 2 of 2 Call Id: [id number] PreDisposition Did not know what to do Care Advice Given Per Guideline GO TO ED NOW (OR PCP TRIAGE): * IF NO PCP (PRIMARY CARE PROVIDER) SECOND-LEVEL TRIAGE: You need to be seen within the next hour. Go to the [facility] at _____________ [place] as soon as you can. CARE ADVICE given per COVID-19 - DIAGNOSED OR SUSPECTED (Adult) guideline. Referrals GO TO FACILITY UNDECIDED

## 2020-08-27 NOTE — Telephone Encounter (Signed)
She is very high risk, I could do a virtual now if she is able/willing.  However at the end is likely that she will need to go to the ER.  Let me know what she likes to do.

## 2020-08-28 ENCOUNTER — Telehealth: Payer: Self-pay

## 2020-08-28 NOTE — Telephone Encounter (Signed)
Nurse Assessment Nurse: Gloriann Loan RN, Sharyn Lull Date/Time (Eastern Time): 08/27/2020 1:32:44 PM Confirm and document reason for call. If symptomatic, describe symptoms. ---Caller states she was exposed to COVID, runny nose and a cough. Temp 100.5. Spoke with a nurse earlier has more questions. 02 level 86. Drops when she coughs. Grandchild called back and advised he had questions in regards to the medical transport. Everyone else at home is COVID positive and they cannot transport her. He wanted to make sure they were doing the right thing vs just over reacting and sending her for nothing. Advised per previous call an hour ago and fever now on board, I would call EMS if no one is available and have her evaluated as recommended earlier. Does the patient have any new or worsening symptoms? ---Yes Will a triage be completed? ---Yes Related visit to physician within the last 2 weeks? ---N/A Does the PT have any chronic conditions? (i.e. diabetes, asthma, this includes High risk factors for pregnancy, etc.) ---Yes List chronic conditions. ---Low Oxygen levels per caller Is this a behavioral health or substance abuse call? ---No Guidelines Guideline Title Affirmed Question Affirmed Notes Nurse Date/Time ([place] Time) COVID-19 - Diagnosed or Suspected Patient sounds very sick or weak to the triager Gloriann Loan, RN, Sharyn Lull 08/27/2020 1:39:01 PM PLEASE NOTE: All timestamps contained within this report are represented as [place] Standard Time. CONFIDENTIALTY NOTICE: This fax transmission is intended only for the addressee. It contains information that is legally privileged, confidential or otherwise protected from use or disclosure. If you are not the intended recipient, you are strictly prohibited from reviewing, disclosing, copying using or disseminating any of this information or taking any action in reliance on or regarding this information. If you have received this fax in error, please notify us  immediately by telephone so that we can arrange for its return to [place]. Phone: [phone], Toll-Free: [phone], Fax: [phone] Page: 2 of 2 Call Id: [id number] [place]. Time Eilene Ghazi Time) Disposition Final User 08/27/2020 1:30:30 PM Send to Urgent Antony Haste 08/27/2020 1:39:54 PM Go to ED Now (or PCP triage) Yes Gloriann Loan, RN, Julio Sicks Disagree/Comply Comply Caller Understands Yes PreDisposition Did not know what to do Care Advice Given Per Guideline GO TO ED NOW (OR PCP TRIAGE): * IF NO PCP (PRIMARY CARE PROVIDER) SECOND-LEVEL TRIAGE: You need to be seen within the next hour. Go to the [facility] at _____________ [place] as soon as you can. CARE ADVICE given per COVID-19 - DIAGNOSED OR SUSPECTED (Adult) guideline. Comments User: Baruch Goldmann Date/Time Eilene Ghazi Time): 08/27/2020 1:30:13 PM Urgent per CN. Referrals GO TO FACILITY UNDECIDED   I spoke w/ Pt yesterday (see other [facility]/after hours call)- I am unable to speak w/ Herbie [place] as he is not on her DPR. Per Pt yesterday- she was feeling better and resting in bed. She would call us if she needed anything further.

## 2020-08-28 NOTE — Chronic Care Management (AMB) (Deleted)
Chronic Care Management Pharmacy  Name: Patricia [name]  MRN: [id number] DOB: [birth year]  Chief Complaint/ HPI  Patricia [name],  80 y.o. , female presents for their Follow-Up CCM visit with the clinical pharmacist via telephone due to COVID-19 Pandemic.  PCP : Colon Branch, MD  Their chronic conditions include: Hypertension, Hyperlipidemia/CAD, Pre-Diabetes, Depression, Osteopenia, COPD  Office Visits: 07/16/20 Larose Kells) - patient was admitted to hospital on 11/26 and stayed 4 days.  BP was 212/70 upon admission.  Started amlodipine and HCTZ and lasix added  05/29/2020: Visit w/ Dr. Larose Kells - Flu shot given. No med changes noted.   Consult Visit: 05/30/20: Cardio visit w/ Richardson Dopp, PA-C - Continue aspirin and atorvastatin. If BP remains >130/80 consider increasing metoprolol to 59m daily.  02/28/20: Cardio visit w/ SRichardson Dopp PA-C - Not recommending event monitor for Afib, recommend echo. Noting ophthalmology increased aspirin to 3279m Ok for now, consider decreasing to aspirin 8158mnd adding clopidogrel. Review with Dr. CooBurt Knack determine whether to send patient to vascular surgery for further eval of carotid arteries. Discussed the importance of aggressive blood pressure and cholesterol management. If BP remains elevated consider beta blocker therapy. LDL goal <70, ideally <55, add ezetimibe 30m42mily. Obtain LFTs in 3 months. RTC 3 months.   Medications: Outpatient Encounter Medications as of 08/31/2020  Medication Sig Note  . amLODipine (NORVASC) 10 MG tablet Take 1 tablet (10 mg total) by mouth daily.   . asMarland Kitchenirin 325 MG tablet Take 325 mg by mouth at bedtime.    . atMarland Kitchenrvastatin (LIPITOR) 80 MG tablet Take 1 tablet (80 mg total) by mouth at bedtime.   . azMarland Kitchenthromycin (ZITHROMAX Z-PAK) 250 MG tablet 2 tabs a day the first day, then 1 tab a day x 4 days   . budesonide-formoterol (SYMBICORT) 160-4.5 MCG/ACT inhaler Inhale 2 puffs into the lungs 2 (two) times daily.   .  Cholecalciferol (VITAMIN D3) 1000 UNITS CAPS Take 1,000 Units by mouth at bedtime.    . clonazePAM (KLONOPIN) 0.5 MG tablet TAKE 1 TABLET BY MOUTH IN THE MORNING AS NEEDED FOR ANXIETY AND 1 & 1/2 TO 2 (ONE & ONE-HALF TO TWO) AT BEDTIME AS NEEDED FOR SLEEP   . ezetimibe (ZETIA) 10 MG tablet Take 1 tablet (10 mg total) by mouth daily. (Patient taking differently: Take 10 mg by mouth at bedtime.)   . FLUoxetine (PROZAC) 20 MG capsule Take 2 capsules (40 mg total) by mouth daily. (Patient taking differently: Take 40 mg by mouth at bedtime.) 07/07/2020: No recent fill hx per pharmacy records on reconcile outside info  . furosemide (LASIX) 40 MG tablet Take 0.5 tablets (20 mg total) by mouth daily.   . hydrochlorothiazide (MICROZIDE) 12.5 MG capsule Take 1 capsule (12.5 mg total) by mouth daily.   . loMarland Kitchenartan (COZAAR) 100 MG tablet Take 1 tablet by mouth once daily (Patient taking differently: Take 100 mg by mouth at bedtime.)   . metoprolol succinate (TOPROL XL) 25 MG 24 hr tablet Take 1 tablet (25 mg total) by mouth daily. (Patient taking differently: Take 25 mg by mouth at bedtime.)   . potassium chloride (KLOR-CON) 10 MEQ tablet Take 1 tablet (10 mEq total) by mouth daily.   . predniSONE (DELTASONE) 10 MG tablet 4 tablets x 2 days, 3 tabs x 2 days, 2 tabs x 2 days, 1 tab x 2 days   . [DISCONTINUED] budesonide-formoterol (SYMBICORT) 160-4.5 MCG/ACT inhaler Inhale 2 puffs into the lungs 2 (two) times daily.  No facility-administered encounter medications on file as of 08/31/2020.   SDOH Screenings   Alcohol Screen: Not on file  Depression (PHQ2-9): Low Risk   . PHQ-2 Score: 0  Financial Resource Strain: Medium Risk  . Difficulty of Paying Living Expenses: Somewhat hard  Food Insecurity: No Food Insecurity  . Worried About Charity fundraiser in the Last Year: Never true  . Ran Out of Food in the Last Year: Never true  Housing: Not on file  Physical Activity: Not on file  Social Connections: Not  on file  Stress: Not on file  Tobacco Use: Medium Risk  . Smoking Tobacco Use: Former Smoker  . Smokeless Tobacco Use: Never Used  Transportation Needs: No Transportation Needs  . Lack of Transportation (Medical): No  . Lack of Transportation (Non-Medical): No    Current Diagnosis/Assessment:  Goals Addressed   None    Social Hx:  Lives with grandson, wife, and her  [place] grand baby ([place]) Retired in 2003. Worked for [employer] 24 years. Typical Day: Home alone, doesn't have many activities at home. Has stopped driving due to vision issues. Son committed suicide in 89s or 10 years ago? He was buried on her grandson's birthday  Hypertension   BP goal is:  <130/80  Office blood pressures are  BP Readings from Last 3 Encounters:  08/29/20 (!) 144/62  07/16/20 (!) 115/55  07/10/20 (!) 134/57   Patient checks BP at home 1-2x per week Patient home BP readings are ranging: Unable to assess  Patient has failed these meds in the past: ACEI (cough) Patient is currently uncontrolled on the following medications:  . Losartan 165m daily . Amlodipine 130mdaily noon . Metoprolol Succinate 2555maily  Denies headaches, chest pains, dizziness  We discussed BP goal  Plan -Check blood pressure 2-3 times per week and record -Continue current medications     Hyperlipidemia/CAD   LDL goal < 70  Last lipids Lab Results  Component Value Date   CHOL 149 01/25/2020   HDL 51.00 01/25/2020   LDLCALC 76 01/25/2020   LDLDIRECT 156.0 04/26/2007   TRIG 110.0 01/25/2020   CHOLHDL 3 01/25/2020   Hepatic Function Latest Ref Rng & Units 07/16/2020 07/07/2020 10/24/2019  Total Protein 6.0 - 8.3 g/dL 6.8 6.0(L) 6.1(L)  Albumin 3.5 - 5.2 g/dL 4.1 3.3(L) 2.8(L)  AST 0 - 37 U/L _0 ALT 0 - 35 U/L _1 Alk Phosphatase 39 - 117 U/L 145(H) 127(H) 115  Total Bilirubin 0.2 - 1.2 mg/dL 0.5 0.7 0.3  Bilirubin, Direct 0.0 - 0.3 mg/dL - - -     The ASCVD Risk  score (GoMikey Bussing Jr., et al., 2013) failed to calculate for the following reasons:   The 2013 ASCVD risk score is only valid for ages 40 33 79 35Patient has failed these meds in past: simvastatin (inefficacy) Patient is currently uncontrolled on the following medications:  . Aspirin 325m67mily (recommended to continue per ophthalmology to protect perfusion distally in the right eye) . Atorvastatin 80mg61mly . Ezetimibe 10mg 49my  Pt would benefit from updated lipid panel  We discussed:  LDL goal  Plan -Complete lipid panel at next office visit -Continue current medications    Pre-Diabetes   A1c goal <6.5%  Recent Relevant Labs: Lab Results  Component Value Date/Time   HGBA1C 5.7 01/25/2020 11:27 AM   HGBA1C 5.4 07/06/2019 02:10 PM   GFR 83.15 07/31/2020 10:58  AM   GFR 40.74 (L) 07/16/2020 12:12 PM    Last diabetic Eye exam: No results found for: HMDIABEYEEXA  Last diabetic Foot exam: No results found for: HMDIABFOOTEX   Patient has failed these meds in past: None noted  Patient is currently controlled on the following medications: . None  Plan -Continue current medications   Depression   Depression screen [facility] 2/9 01/26/2020 01/25/2020 07/06/2019  Decreased Interest 0 1 1  Down, Depressed, Hopeless 0 1 1  PHQ - 2 Score 0 2 2  Altered sleeping - 0 0  Tired, decreased energy - 1 1  Change in appetite - 0 0  Feeling bad or failure about yourself  - 0 0  Trouble concentrating - 0 0  Moving slowly or fidgety/restless - 0 0  Suicidal thoughts - 0 0  PHQ-9 Score - 3 3  Difficult doing work/chores - Not difficult at all Not difficult at all  Some recent data might be hidden    Patient has failed these meds in past: None noted  Patient is currently controlled on the following medications:   Clonazepam 0.$RemoveBefor'5mg'dUlewForugLe$  #1AM and #1.5-2 HS (only takes 1 at bedtime)  Fluoxetine $RemoveBef'20mg'TqoWLHRMsC$  #2 daily  Started when her son died Worries about her grandson because she knows he is  suffering too   Plan -Continue current medications   Future Plan -Assess PHQ9 at next visit    Osteopenia    Last DEXA Scan: 11/28/12  T-Score femoral neck: -1.7  Vit D, 25-Hydroxy  Date Value Ref Range Status  08/13/2010 32 30 - 89 ng/mL Final    Comment:    See lab report for associated comment(s)     Patient is not a candidate for pharmacologic treatment  Patient has failed these meds in past: None noted  Patient is currently controlled on the following medications:  Marland Kitchen Vitamin D 1000 units daily  We discussed:  Recommend [phone] units of vitamin D daily. Recommend 1200 mg of calcium daily from dietary and supplemental sources.  Plan -Consider intake of calcium $RemoveBe'1200mg'BstuyGzVG$  daily (could consider vitamin D and calcium combo) -Consider repeat DEXA Scan  COPD / Tobacco   Last spirometry score: Specific readings not noted  Eosinophil count:   Lab Results  Component Value Date/Time   EOSPCT 0.8 07/16/2020 12:12 PM  %                               Eos (Absolute):  Lab Results  Component Value Date/Time   EOSABS 0.1 07/16/2020 12:12 PM    Tobacco Status:  Social History   Tobacco Use  Smoking Status Former Smoker  . Packs/day: 0.50  . Years: 54.00  . Pack years: 27.00  . Types: Cigarettes  . Quit date: 12/15/2013  . 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 No (She can't recall last time she used) Frequency of rescue inhaler use:  infrequently (does not have one prescribed?)  Feels SOB with exertion Pt implies cost of medication prevents her from using daily.  Discussed possibility of PAP   Plan -Continue current medications  -Consider applying for Symbicort PAP   Vaccines   Reviewed and  discussed patient's vaccination history.  Immunization History  Administered Date(s) Administered  . Fluad Quad(high Dose 65+) 07/06/2019, 05/29/2020  . Influenza Split 07/24/2011, 04/30/2012  . Influenza Whole 06/14/2007, 06/08/2008, 05/18/2009, 05/09/2010  . Influenza, High Dose Seasonal PF 04/11/2013, 05/13/2016, 06/24/2017, 05/03/2018  . Influenza,inj,Quad PF,6+ Mos 05/09/2014, 05/28/2015  . PFIZER(Purple Top)SARS-COV-2 Vaccination 10/29/2019, 11/26/2019  . Pneumococcal Conjugate-13 04/06/2014  . Pneumococcal Polysaccharide-23 08/11/2005, 07/18/2010  . Td 01/25/2007  . Tdap 10/07/2016    Plan -Recommended patient receive Shingles vaccine in pharmacy. Pt declines  Beverly Milch, PharmD Clinical Pharmacist [place] [phone]

## 2020-08-29 ENCOUNTER — Encounter: Payer: Self-pay | Admitting: Internal Medicine

## 2020-08-29 ENCOUNTER — Telehealth (INDEPENDENT_AMBULATORY_CARE_PROVIDER_SITE_OTHER): Payer: Medicare Other | Admitting: Internal Medicine

## 2020-08-29 VITALS — BP 144/62 | HR 61 | Temp 98.0°F | Ht 62.0 in | Wt 92.0 lb

## 2020-08-29 DIAGNOSIS — U071 COVID-19: Secondary | ICD-10-CM

## 2020-08-29 DIAGNOSIS — J441 Chronic obstructive pulmonary disease with (acute) exacerbation: Secondary | ICD-10-CM | POA: Diagnosis not present

## 2020-08-29 MED ORDER — BUDESONIDE-FORMOTEROL FUMARATE 160-4.5 MCG/ACT IN AERO: 2.0000 | INHALATION_SPRAY | Freq: Two times a day (BID) | RESPIRATORY_TRACT | 2 refills | Status: AC

## 2020-08-29 MED ORDER — AZITHROMYCIN 250 MG PO TABS: ORAL_TABLET | ORAL | 0 refills | Status: DC

## 2020-08-29 MED ORDER — PREDNISONE 10 MG PO TABS: ORAL_TABLET | ORAL | 0 refills | Status: DC

## 2020-08-29 NOTE — Progress Notes (Signed)
Subjective:    Patient ID: Patricia [name], female    DOB: [birth year], 80 y.o.   MRN: [id number]  DOS:  08/29/2020 Type of visit - description: Virtual Visit via Video Note  I connected with the above patient  by a video enabled telemedicine application and verified that I am speaking with the correct person using two identifiers.   THIS ENCOUNTER IS A VIRTUAL VISIT DUE TO COVID-19 - PATIENT WAS NOT SEEN IN THE OFFICE. PATIENT HAS CONSENTED TO VIRTUAL VISIT / TELEMEDICINE VISIT   Location of patient: home  Location of provider: office  Persons participating in the virtual visit: patient, provider   I discussed the limitations of evaluation and management by telemedicine and the availability of in person appointments. The patient expressed understanding and agreed to proceed.  Acute The patient was doing okay until 2 days ago when her chronic cough got worse than before, she developed sore throat, and started running low-grade fevers, Tmax100.5.  Her grandson and his family live in the same house, the grandson tested COVID-positive on 08/22/2020, they have not been masking.  O2 sat are getting low.  Denies chest pain or difficulty breathing per se No wheezing no chest congestion No nausea or vomiting No myalgias No sinus congestion   Review of Systems See above   Past Medical History:  Diagnosis Date   Abnormal LFTs    Allergic rhinitis    Anxiety and depression    son commited suicide 2010   Arthritis    In her thumb   Branch retinal artery occlusion    Echocardiogram 8/21:  EF 59, no RWMA, mild LVH, Gr 1 DD, normal RVSF, mild MR [no intracardiac source of embolism]   CAD (coronary artery disease)    s/p CABG   Cancer (HCC)    anal   Carotid artery disease (HCC)    Moderate - f/u dopplers needed 11/2012   Central retinal vein occlusion of left eye 02/2015   Receiving injections in eye every 5 weeks by Dr. Zadie Rhine   COPD (chronic obstructive pulmonary  disease) ([place])    HTN (hypertension)    intolerant to ACEi (cough)   Hyperlipidemia    Osteopenia    PAD (peripheral artery disease) ([place])    Carotid dz as  below, and at [facility] 11/12:  critical stenosis noted just below the sheath site leading into a totally occluded SFA and a patent deep femoral artery   Panic attacks    Thyroid nodule     Past Surgical History:  Procedure Laterality Date   BIOPSY  09/07/2019   Procedure: BIOPSY;  Surgeon: Jackquline Denmark, MD;  Location: [facility];  Service: Gastroenterology;;   CORONARY ANGIOPLASTY WITH STENT PLACEMENT     "1; makes total of 3" (07/05/2012)   CORONARY ARTERY BYPASS GRAFT N/A 12/15/2013   Procedure: CORONARY ARTERY BYPASS GRAFTING (CABG);  Surgeon: Gaye Pollack, MD;  Location: [facility];  Service: Open Heart Surgery;  Laterality: N/A;  Times 3 using left internal mammary artery and endoscopically harvested right saphenous vein    FLEXIBLE SIGMOIDOSCOPY N/A 09/07/2019   Procedure: FLEXIBLE SIGMOIDOSCOPY;  Surgeon: Jackquline Denmark, MD;  Location: [facility];  Service: Gastroenterology;  Laterality: N/A;   INTRAOPERATIVE TRANSESOPHAGEAL ECHOCARDIOGRAM N/A 12/15/2013   Procedure: INTRAOPERATIVE TRANSESOPHAGEAL ECHOCARDIOGRAM;  Surgeon: Gaye Pollack, MD;  Location: [facility];  Service: Open Heart Surgery;  Laterality: N/A;   KIDNEY SURGERY  1960's   "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; called it  floating; had OR to put it where it belongs" (07/05/2012)   LEFT HEART CATHETERIZATION WITH CORONARY ANGIOGRAM N/A 07/02/2011   Procedure: LEFT HEART CATHETERIZATION WITH CORONARY ANGIOGRAM;  Surgeon: Sherren Mocha, MD;  Location: [facility] CATH LAB;  Service: Cardiovascular;  Laterality: N/A;   LEFT HEART CATHETERIZATION WITH CORONARY ANGIOGRAM N/A 07/05/2012   Procedure: LEFT HEART CATHETERIZATION WITH CORONARY ANGIOGRAM;  Surgeon: Sherren Mocha, MD;  Location: [facility] CATH LAB;  Service: Cardiovascular;  Laterality: N/A;   LEFT HEART CATHETERIZATION  WITH CORONARY ANGIOGRAM N/A 11/28/2013   Procedure: LEFT HEART CATHETERIZATION WITH CORONARY ANGIOGRAM;  Surgeon: Blane Ohara, MD;  Location: [facility] CATH LAB;  Service: Cardiovascular;  Laterality: N/A;   PERCUTANEOUS CORONARY INTERVENTION-BALLOON ONLY  07/02/2011   Procedure: PERCUTANEOUS CORONARY INTERVENTION-BALLOON ONLY;  Surgeon: Sherren Mocha, MD;  Location: [facility] CATH LAB;  Service: Cardiovascular;;   PERCUTANEOUS CORONARY INTERVENTION-BALLOON ONLY  07/05/2012   Procedure: PERCUTANEOUS CORONARY INTERVENTION-BALLOON ONLY;  Surgeon: Sherren Mocha, MD;  Location[facility] C[facility] LAB;  Service: Cardiovascular;;   VAGINAL HYSTERECTOMY  ~ 1976   no oophorectomy    Allergies as of 08/29/2020      Reactions   Ace Inhibitors Other (See Comments)   cough      Medication List       Accurate as of August 29, 2020 11:59 PM. If you have any questions, ask your nurse or doctor.        amLODipine 10 MG tablet Commonly known as: NORVASC Take 1 tablet (10 mg total) by mouth daily.   aspirin 325 MG tablet Take 325 mg by mouth at bedtime.   atorvastatin 80 MG tablet Commonly known as: LIPITOR Take 1 tablet (80 mg total) by mouth at bedtime.   azithromycin 250 MG tablet Commonly known as: Zithromax Z-Pak 2 tabs a day the first day, then 1 tab a day x 4 days Started by: Kathlene November, MD   budesonide-formoterol 160-4.5 MCG/ACT inhaler Commonly known as: SYMBICORT Inhale 2 puffs into the lungs 2 (two) times daily.   clonazePAM 0.5 MG tablet Commonly known as: KLONOPIN TAKE 1 TABLET BY MOUTH IN THE MORNING AS NEEDED FOR ANXIETY AND 1 & 1/2 TO 2 (ONE & ONE-HALF TO TWO) AT BEDTIME AS NEEDED FOR SLEEP   ezetimibe 10 MG tablet Commonly known as: ZETIA Take 1 tablet (10 mg total) by mouth daily. What changed: when to take this   FLUoxetine 20 MG capsule Commonly known as: PROZAC Take 2 capsules (40 mg total) by mouth daily. What changed: when to take this   furosemide 40 MG tablet Commonly  known as: LASIX Take 0.5 tablets (20 mg total) by mouth daily.   hydrochlorothiazide 12.5 MG capsule Commonly known as: MICROZIDE Take 1 capsule (12.5 mg total) by mouth daily.   losartan 100 MG tablet Commonly known as: COZAAR Take 1 tablet by mouth once daily What changed: when to take this   metoprolol succinate 25 MG 24 hr tablet Commonly known as: Toprol XL Take 1 tablet (25 mg total) by mouth daily. What changed: when to take this   potassium chloride 10 MEQ tablet Commonly known as: KLOR-CON Take 1 tablet (10 mEq total) by mouth daily.   predniSONE 10 MG tablet Commonly known as: DELTASONE 4 tablets x 2 days, 3 tabs x 2 days, 2 tabs x 2 days, 1 tab x 2 days Started by: Kathlene November, MD   Vitamin D3 25 MCG (1000 UT) Caps Take 1,000 Units by mouth at bedtime.  Objective:   Physical Exam BP (!) 144/62 (BP Location: Right Arm, Patient Position: Sitting, Cuff Size: Large)    Pulse 61    Temp 98 F (36.7 C) (Oral)    Ht 5\' 2"  (1.575 m)    Wt 92 lb (41.7 kg)    SpO2 92%    BMI 16.83 kg/m  This is a virtual video visit, the patient is alert oriented x3, speaking in complete sentences, does not seem in acute distress.  Nicole Kindred, the grandson participate on the call.  No mask used    Assessment     Assessment   Hyperglycemia, A1c 5.7 HTN Hyperlipidemia Anxiety depression COPD , quit tobacco 2015 DJD CV: --CAD,cath 2012, 2013, CABG 2015 --PAD -   ABIs 2012, 07-2015 : Stable moderate disease left, normal right  --Carotid disease   --Central Retinal vein occlusion 02-2015 --Palpable aorta US abdomen 06-2015 no AA   Increase LFTs, elevated alkaline phosphatase , [place] abd neg 06-2015  Thyroid nodule --[place] 2012 stable (since 2009) Anal cancer:  DX 08-2019, status post chemotherapy.  On clinical remission as of 01/2020   PLAN COPD exacerbation: The patient has a history of recent respiratory failure, COPD, currently not using any inhalers,S/p COVID-vaccine x2 and a flu  shot, presents with increased cough from baseline.  She has been certainly exposed to COVID-19, her O2 sat is typically 94% and sometimes  97%.  Since this  started it drops to the low 80s with minimal exertion.  During this visit it was 92%, then 89%. My advice is to go to the emergency room as she can quickly deteriorate.  She and her grandson verbalized understanding but the patient declines to go. Again I made sure the pt  and her grandson understood that ER is my first advise, but since she is declining to go, I offered  the following: Be checked for COVID ASAP and let me know.  She could be a good candidate for a monoclonal infusion. Prednisone,  Zithromax, Restart Symbicort twice a day, start masking. Monitor O2 sats, if consistently less than 90%: I will insist on a ER visit. Addendum: COVID test came back positive, referred to the infusion center  I discussed the assessment and treatment plan with the patient. The patient was provided an opportunity to ask questions and all were answered. The patient agreed with the plan and demonstrated an understanding of the instructions.   The patient was advised to call back or seek an in-person evaluation if the symptoms worsen or if the condition fails to improve as anticipated.

## 2020-08-29 NOTE — Telephone Encounter (Signed)
Patient is wondering if you are going to prescribe any medication

## 2020-08-30 ENCOUNTER — Telehealth: Payer: Self-pay | Admitting: Nurse Practitioner

## 2020-08-30 ENCOUNTER — Encounter (INDEPENDENT_AMBULATORY_CARE_PROVIDER_SITE_OTHER): Payer: Medicare Other | Admitting: Ophthalmology

## 2020-08-30 NOTE — Assessment & Plan Note (Addendum)
COPD exacerbation: The patient has a history of recent respiratory failure, COPD, currently not using any inhalers,S/p COVID-vaccine x2 and a flu shot, presents with increased cough from baseline.  She has been certainly exposed to COVID-19, her O2 sat is typically 94% and sometimes  97%.  Since this  started it drops to the low 80s with minimal exertion.  During this visit it was 92%, then 89%. My advice is to go to the emergency room as she can quickly deteriorate.  She and her grandson verbalized understanding but the patient declines to go. Again I made sure the pt  and her grandson understood that ER is my first advise, but since she is declining to go, I offered  the following: Be checked for COVID ASAP and let me know.  She could be a good candidate for a monoclonal infusion. Prednisone,  Zithromax, Restart Symbicort twice a day, start masking. Monitor O2 sats, if consistently less than 90%: I will insist on a ER visit. Addendum: COVID test came back positive, referred to the infusion center

## 2020-08-30 NOTE — Telephone Encounter (Signed)
Called to Discuss with patient about Covid symptoms and the use of the monoclonal antibody infusion for those with mild to moderate Covid symptoms and at a high risk of hospitalization.     Pt appears to qualify for this infusion due to co-morbid conditions and/or a member of an at-risk group in accordance with the FDA Emergency Use Authorization.    Unable to reach pt. Voicemail left and My Chart message sent.   Symptom onset: 08/27/20 Vaccinated: x2 Qualified for Infusion: Yes, CABG, htn, CAD, anal ca, COPD  Alda Lea, NP [facility]  [phone]

## 2020-08-31 ENCOUNTER — Telehealth: Payer: Medicare Other

## 2020-09-03 ENCOUNTER — Other Ambulatory Visit: Payer: Medicare Other

## 2020-09-03 ENCOUNTER — Ambulatory Visit: Payer: Medicare Other | Admitting: Oncology

## 2020-09-09 ENCOUNTER — Encounter: Payer: Self-pay | Admitting: Internal Medicine

## 2020-09-10 ENCOUNTER — Telehealth: Payer: Self-pay | Admitting: Pharmacist

## 2020-09-10 NOTE — Progress Notes (Signed)
Chronic Care Management Pharmacy Assistant   Name: Patricia [name]  MRN: [id number] DOB: [birth year]  Reason for Encounter: Adherence Review    PCP : Colon Branch, MD  Verified Adherence Gap Information. Per insurance data patient has met their annual wellness and wellness bundle screening. Their most recent A1C on 5.7 on 01/25/20. Their most recent blood pressure was 146/90 on 05/30/20. The patients total gaps-all measures are equal to 1.  Follow-Up:  Pharmacist Review   Charlann Lange, [place] Pharmacist Assistant [phone]

## 2020-09-12 ENCOUNTER — Other Ambulatory Visit: Payer: Self-pay

## 2020-09-12 ENCOUNTER — Other Ambulatory Visit (INDEPENDENT_AMBULATORY_CARE_PROVIDER_SITE_OTHER): Payer: Medicare Other

## 2020-09-12 ENCOUNTER — Telehealth: Payer: Self-pay | Admitting: *Deleted

## 2020-09-12 DIAGNOSIS — E876 Hypokalemia: Secondary | ICD-10-CM | POA: Diagnosis not present

## 2020-09-12 LAB — BASIC METABOLIC PANEL
BUN: 16 mg/dL (ref 6–23)
CO2: 33 mEq/L — ABNORMAL HIGH (ref 19–32)
Calcium: 9.7 mg/dL (ref 8.4–10.5)
Chloride: 100 mEq/L (ref 96–112)
Creatinine, Ser: 0.71 mg/dL (ref 0.40–1.20)
GFR: 80.23 mL/min (ref >60.00)
Glucose, Bld: 107 mg/dL — ABNORMAL HIGH (ref 70–99)
Potassium: 3.5 mEq/L (ref 3.5–5.1)
Sodium: 139 mEq/L (ref 135–145)

## 2020-09-12 NOTE — Telephone Encounter (Signed)
Pt has lab appointment today to repeat potassium? But there are no future orders in Epic.  Can you place order if appropriate.

## 2020-09-12 NOTE — Telephone Encounter (Signed)
BMP ordered

## 2020-09-12 NOTE — Telephone Encounter (Signed)
Thank you :)

## 2020-09-13 ENCOUNTER — Other Ambulatory Visit: Payer: Self-pay | Admitting: Internal Medicine

## 2020-09-17 MED ORDER — POTASSIUM CHLORIDE ER 10 MEQ PO TBCR: 10.0000 meq | EXTENDED_RELEASE_TABLET | Freq: Every day | ORAL | 0 refills | Status: DC

## 2020-09-17 NOTE — Addendum Note (Signed)
Addended byDamita Dunnings D on: 09/17/2020 09:15 AM   Modules accepted: Orders

## 2020-10-08 ENCOUNTER — Telehealth: Payer: Self-pay

## 2020-10-08 ENCOUNTER — Inpatient Hospital Stay: Payer: Medicare Other | Attending: Oncology

## 2020-10-08 ENCOUNTER — Telehealth: Payer: Self-pay | Admitting: Oncology

## 2020-10-08 ENCOUNTER — Inpatient Hospital Stay (HOSPITAL_BASED_OUTPATIENT_CLINIC_OR_DEPARTMENT_OTHER): Payer: Medicare Other | Admitting: Oncology

## 2020-10-08 ENCOUNTER — Other Ambulatory Visit: Payer: Self-pay

## 2020-10-08 VITALS — BP 118/89 | HR 99 | Temp 97.8°F | Resp 20 | Ht 62.0 in | Wt 95.0 lb

## 2020-10-08 DIAGNOSIS — Z9221 Personal history of antineoplastic chemotherapy: Secondary | ICD-10-CM | POA: Diagnosis not present

## 2020-10-08 DIAGNOSIS — J449 Chronic obstructive pulmonary disease, unspecified: Secondary | ICD-10-CM | POA: Diagnosis not present

## 2020-10-08 DIAGNOSIS — E785 Hyperlipidemia, unspecified: Secondary | ICD-10-CM | POA: Insufficient documentation

## 2020-10-08 DIAGNOSIS — R112 Nausea with vomiting, unspecified: Secondary | ICD-10-CM | POA: Diagnosis not present

## 2020-10-08 DIAGNOSIS — C210 Malignant neoplasm of anus, unspecified: Secondary | ICD-10-CM | POA: Diagnosis not present

## 2020-10-08 DIAGNOSIS — T451X5A Adverse effect of antineoplastic and immunosuppressive drugs, initial encounter: Secondary | ICD-10-CM | POA: Insufficient documentation

## 2020-10-08 DIAGNOSIS — I2510 Atherosclerotic heart disease of native coronary artery without angina pectoris: Secondary | ICD-10-CM | POA: Diagnosis not present

## 2020-10-08 DIAGNOSIS — D61818 Other pancytopenia: Secondary | ICD-10-CM | POA: Insufficient documentation

## 2020-10-08 DIAGNOSIS — F418 Other specified anxiety disorders: Secondary | ICD-10-CM | POA: Insufficient documentation

## 2020-10-08 DIAGNOSIS — G893 Neoplasm related pain (acute) (chronic): Secondary | ICD-10-CM | POA: Insufficient documentation

## 2020-10-08 LAB — BASIC METABOLIC PANEL - CANCER CENTER ONLY
Anion gap: 11 (ref 5–15)
BUN: 10 mg/dL (ref 8–23)
CO2: 28 mmol/L (ref 22–32)
Calcium: 9.8 mg/dL (ref 8.9–10.3)
Chloride: 102 mmol/L (ref 98–111)
Creatinine: 0.76 mg/dL (ref 0.44–1.00)
GFR, Estimated: >60 mL/min (ref >60)
Glucose, Bld: 108 mg/dL — ABNORMAL HIGH (ref 70–99)
Potassium: 3.2 mmol/L — ABNORMAL LOW (ref 3.5–5.1)
Sodium: 141 mmol/L (ref 135–145)

## 2020-10-08 NOTE — Telephone Encounter (Signed)
Scheduled per los. Gave avs and calendar  

## 2020-10-08 NOTE — Telephone Encounter (Signed)
-----   Message from Ladell Pier, MD sent at 10/08/2020  5:11 PM EST ----- Please call patient, the potassium is low, forward result to Dr. Larose Kells

## 2020-10-08 NOTE — Telephone Encounter (Signed)
Pt made aware of most recent labs  Results will be faxed to Dr. Larose Kells as directed

## 2020-10-08 NOTE — Progress Notes (Signed)
[facility] OFFICE PROGRESS NOTE   Diagnosis: Anal cancer  INTERVAL HISTORY:   Ms. Mcmurry returns as scheduled.  She feels well.  No complaint.  No difficulty with bowel function.  No rectal pain or bleeding.  She last saw Dr. Marcello Moores in August 2021.  She missed her last appointment due to heart issues.  She has received the COVID-19 vaccine.  Objective:  Vital signs in last 24 hours:  Blood pressure 118/89, pulse 99, temperature 97.8 F (36.6 C), temperature source Tympanic, resp. rate 20, height 5\' 2"  (1.575 m), weight 95 lb (43.1 kg), SpO2 98 %.     Lymphatics: No cervical, supraclavicular, axillary, or inguinal nodes Resp: Distant breath sounds, scattered inspiratory rhonchi, no respiratory distress Cardio: Regular rate and rhythm GI: No mass, no hepatosplenomegaly, nontender Vascular: No leg edema Rectal: Mild radiation change at the perineum, at the 6 o'clock position, anterior anal verge there is a 2 cm firm raised lesion.  The lesion feels ulcerated.   Lab Results:  Lab Results  Component Value Date   WBC 9.4 07/16/2020   HGB 14.3 07/16/2020   HCT 42.6 07/16/2020   MCV 93.7 07/16/2020   PLT 262.0 07/16/2020   NEUTROABS 6.9 07/16/2020    CMP  Lab Results  Component Value Date   NA 139 09/12/2020   K 3.5 09/12/2020   CL 100 09/12/2020   CO2 33 (H) 09/12/2020   GLUCOSE 107 (H) 09/12/2020   BUN 16 09/12/2020   CREATININE 0.71 09/12/2020   CALCIUM 9.7 09/12/2020   PROT 6.8 07/16/2020   ALBUMIN 4.1 07/16/2020   AST 17 07/16/2020   ALT 16 07/16/2020   ALKPHOS 145 (H) 07/16/2020   BILITOT 0.5 07/16/2020   GFRNONAA >60 07/10/2020   GFRAA >60 01/16/2020    Medications: I have reviewed the patient's current medications.   Assessment/Plan: 1. Anal cancer  09/06/2019 CT abdomen/pelvis-complex 5.1 cm x 3.2 cm x 4.1 cm posterior left perirectal fluid collection/abscess.  09/07/2019 flexible sigmoidoscopy by Dr. Sim Boast cm x 5 cm hard  fungating anal mass located predominantly at the left anterior-lateral area and extending to the dentate line with minor anal stenosis. 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  PET scan 09/22/2019-large intensely hypermetabolic exophytic mass extending posteriorly from the anus consistent with primary anal carcinoma.  Local regional hypermetabolic nodal metastasis to a left inguinal lymph node.  No iliac nodal metastasis or para-aortic nodal metastasis.  No liver or other visceral metastasis. Intensely hypermetabolic rounded right supraclavicular mass.  Cycle 1 day 1 dose-reduced 5FU/mitomycin and concurrent RT 09/26/19  Cycle two 5-FU/mitomycin-C 10/24/2019, radiation completed 11/11/2019 2. Rectal bleedingand painsecondary to #1-improved 3. CAD/CABG 4. Hypertension - BP 91/64 on day 10 chemoRT, instructed to hold losartan and amlodipine as of 10/05/19 5. Hyperlipidemia 6. Carotid disease 7. COPD 8. Anxiety/depression 9. Nausea and diarrhea, secondary to chemoRT starting day 5, imodium partially effective. Started lomotil 10/05/19.  10. Mild pancytopenia secondary to chemo, on day 10 of chemoRT WBC 3.7, Hgb 11.4, PLT 142 11.  Hypermetabolic mass right neck on PET scan 09/22/2019.  She declined further evaluation citing a long history of thyroid nodules.    Disposition: Ms. Rhude is now almost 1 year out from completing treatment for locally advanced anal cancer.  There is a firm nodular area at the anterior anal verge on exam today.  I recommend follow-up with Dr. Marcello Moores for an anoscopy and repeat biopsy.  She agrees.  We will contact Dr. Marcello Moores  to schedule an appointment.  She will return for an office visit in 6 months.  We are available to see her sooner pending the evaluation by Dr. Marcello Moores.  Betsy Coder, MD  10/08/2020  4:03 PM

## 2020-10-09 ENCOUNTER — Telehealth: Payer: Self-pay | Admitting: *Deleted

## 2020-10-09 NOTE — Telephone Encounter (Signed)
Notified patient that her K+ was low at 3.2 and results have been forwarded to Dr. Larose Kells. She admits she has missed several doses and will get back on it consistently. Faxed referral order for return visit and chart information for [facility] to see Dr. Leighton Ruff.

## 2020-10-11 ENCOUNTER — Telehealth: Payer: Self-pay | Admitting: Internal Medicine

## 2020-10-11 NOTE — Telephone Encounter (Signed)
Called pt and left VM to return call to office. -JMA

## 2020-10-11 NOTE — Telephone Encounter (Signed)
Please call patient. BMP on 10/08/2020 show a potassium of 3.2. Please ask patient: Current doses of potassium, Lasix, HCTZ How are blood pressure readings

## 2020-10-14 ENCOUNTER — Other Ambulatory Visit: Payer: Self-pay | Admitting: Cardiovascular Disease

## 2020-10-15 ENCOUNTER — Ambulatory Visit (INDEPENDENT_AMBULATORY_CARE_PROVIDER_SITE_OTHER): Payer: Medicare Other | Admitting: Internal Medicine

## 2020-10-15 ENCOUNTER — Encounter: Payer: Self-pay | Admitting: Internal Medicine

## 2020-10-15 ENCOUNTER — Other Ambulatory Visit: Payer: Self-pay

## 2020-10-15 VITALS — BP 108/64 | HR 87 | Temp 98.4°F | Resp 18 | Ht 62.0 in | Wt 98.1 lb

## 2020-10-15 DIAGNOSIS — J449 Chronic obstructive pulmonary disease, unspecified: Secondary | ICD-10-CM

## 2020-10-15 DIAGNOSIS — E785 Hyperlipidemia, unspecified: Secondary | ICD-10-CM | POA: Diagnosis not present

## 2020-10-15 DIAGNOSIS — I10 Essential (primary) hypertension: Secondary | ICD-10-CM | POA: Diagnosis not present

## 2020-10-15 DIAGNOSIS — Z23 Encounter for immunization: Secondary | ICD-10-CM

## 2020-10-15 LAB — BASIC METABOLIC PANEL
BUN: 8 mg/dL (ref 6–23)
CO2: 32 mEq/L (ref 19–32)
Calcium: 9.4 mg/dL (ref 8.4–10.5)
Chloride: 101 mEq/L (ref 96–112)
Creatinine, Ser: 0.6 mg/dL (ref 0.40–1.20)
GFR: 84.65 mL/min (ref >60.00)
Glucose, Bld: 113 mg/dL — ABNORMAL HIGH (ref 70–99)
Potassium: 3.2 mEq/L — ABNORMAL LOW (ref 3.5–5.1)
Sodium: 140 mEq/L (ref 135–145)

## 2020-10-15 LAB — LIPID PANEL
Cholesterol: 129 mg/dL (ref 0–200)
HDL: 45.9 mg/dL (ref >39.00)
LDL Cholesterol: 63 mg/dL (ref 0–99)
NonHDL: 82.88
Total CHOL/HDL Ratio: 3
Triglycerides: 101 mg/dL (ref 0.0–149.0)
VLDL: 20.2 mg/dL (ref 0.0–40.0)

## 2020-10-15 IMAGING — US IR PICC >5YO
1 series · 1 of 1 positions shown · IV contrast (agent unspecified)
Comparison: none

INDICATION: Patient with history of anal cancer. Presents for PICC line
placement for chemotherapy access

EXAM:
ULTRASOUND AND FLUOROSCOPIC GUIDED PICC LINE INSERTION
MEDICATIONS:
Lidocaine 1% 10 mL
CONTRAST:  None
FLUOROSCOPY TIME:  1.6 seconds (3 mGy)
COMPLICATIONS:
None immediate.
TECHNIQUE: The procedure, risks, benefits, and alternatives were explained to
the patient and informed written consent was obtained.

[Series 1: ir fluoro/shunt/fist · 1 of 1 slices shown]
[im 1/1]
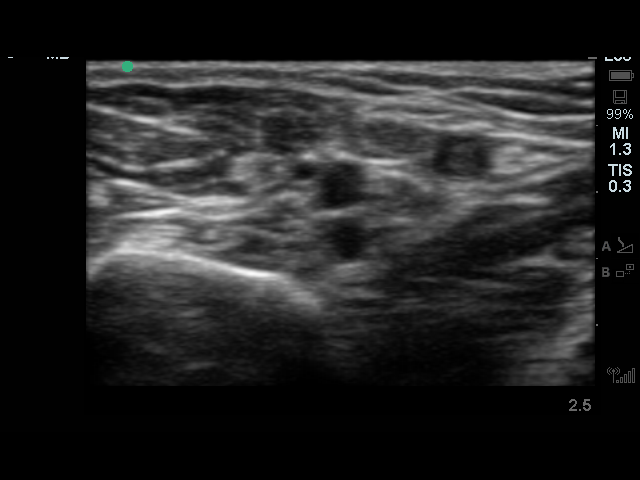

[1 of 1 positions shown; findings below may reference images not displayed]

The right upper extremity was prepped with chlorhexidine in a
sterile fashion, and a sterile drape was applied covering the
operative field. Maximum barrier sterile technique with sterile
gowns and gloves were used for the procedure. A timeout was
performed prior to the initiation of the procedure. Local anesthesia
was provided with 1% lidocaine.

After the overlying soft tissues were anesthetized and a
micropuncture kit was utilized to access the right basilic vein.
Real-time ultrasound guidance was utilized for vascular access
including the acquisition of a permanent ultrasound image
documenting patency of the accessed vessel.

A guidewire was advanced to the level of the superior caval-atrial
junction for measurement purposes and the PICC line was cut to
length. A peel-away sheath was placed and a 37 cm, 5 French, single
lumen was inserted to level of the superior caval-atrial junction. A
post procedure spot fluoroscopic was obtained. The catheter easily
aspirated and flushed and was secured in place. A dressing was
placed. The patient tolerated the procedure well without immediate
post procedural complication.
FINDINGS: After catheter placement, the tip lies within the superior
cavoatrial junction. The catheter aspirates and flushes normally and
is ready for immediate use.
IMPRESSION: Successful ultrasound and fluoroscopic guided placement of a right
basilic vein approach, 37 cm, 5 French, single lumen PICC with tip
at the superior caval-atrial junction. The PICC line is ready for
immediate use.

Read by Nonhlanhla October NP

## 2020-10-15 NOTE — Patient Instructions (Signed)
Check the  blood pressure regularly ?BP GOAL is between 110/65 and  135/85. ?If it is consistently higher or lower, let me know ?  ? ? ?GO TO THE LAB : Get the blood work   ? ? ?GO TO THE FRONT DESK, PLEASE SCHEDULE YOUR APPOINTMENTS ?Come back for  a check up in 4 months  ?

## 2020-10-15 NOTE — Progress Notes (Signed)
Subjective:    Patient ID: Patricia [name], female    DOB: 05/09/1941, 80 y.o.   MRN: [id number]  DOS:  10/15/2020 Type of visit - description: Follow-up  Since the last office visit she is doing okay. Does not longer need oxygen. Ambulatory BPs are very good.   Wt Readings from Last 3 Encounters:  10/15/20 98 lb 2 oz (44.5 kg)  10/08/20 95 lb (43.1 kg)  08/29/20 92 lb (41.7 kg)    Review of Systems See above   Past Medical History:  Diagnosis Date  . Abnormal LFTs   . Allergic rhinitis   . Anxiety and depression    son commited suicide 2010  . 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 Branch retinal artery occlusion    Echocardiogram 8/21:  EF 59, no RWMA, mild LVH, Gr 1 DD, normal RVSF, mild MR [no intracardiac source of embolism]  . CAD (coronary artery disease)    s/p CABG  . Cancer [facility])    anal  . Carotid artery disease ([place])    Moderate - f/u dopplers needed 11/2012  . 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 Zadie Rhine  . COPD (chronic obstructive pulmonary disease) ([place])   . HTN (hypertension)    intolerant to ACEi (cough)  . Hyperlipidemia   . Osteopenia   . PAD (peripheral artery disease) ([place])    Carotid dz as  below, and at [facility] 11/12:  critical stenosis noted just below the sheath site leading into a totally occluded SFA and a patent deep femoral artery  . Panic attacks   . Thyroid nodule     Past Surgical History:  Procedure Laterality Date  . BIOPSY  09/07/2019   Procedure: BIOPSY;  Surgeon: Jackquline Denmark, MD;  Location: [facility];  Service: Gastroenterology;;  . CORONARY ANGIOPLASTY WITH STENT PLACEMENT     "1; makes total of 3" (07/05/2012)  . CORONARY ARTERY BYPASS GRAFT N/A 12/15/2013   Procedure: CORONARY ARTERY BYPASS GRAFTING (CABG);  Surgeon: Gaye Pollack, MD;  Location: [facility];  Service: Open Heart Surgery;  Laterality: N/A;  Times 3 using left internal mammary artery and endoscopically  harvested right saphenous vein   . FLEXIBLE SIGMOIDOSCOPY N/A 09/07/2019   Procedure: FLEXIBLE SIGMOIDOSCOPY;  Surgeon: Jackquline Denmark, MD;  Location: [facility];  Service: Gastroenterology;  Laterality: N/A;  . INTRAOPERATIVE TRANSESOPHAGEAL ECHOCARDIOGRAM N/A 12/15/2013   Procedure: INTRAOPERATIVE TRANSESOPHAGEAL ECHOCARDIOGRAM;  Surgeon: Gaye Pollack, MD;  Location: [facility] OR;  Service: Open Heart Surgery;  Laterality: N/A;  . 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 LEFT HEART CATHETERIZATION WITH CORONARY ANGIOGRAM N/A 07/02/2011   Procedure: LEFT HEART CATHETERIZATION WITH CORONARY ANGIOGRAM;  Surgeon: Sherren Mocha, MD;  Location: [facility] CATH LAB;  Service: Cardiovascular;  Laterality: N/A;  . LEFT HEART CATHETERIZATION WITH CORONARY ANGIOGRAM N/A 07/05/2012   Procedure: LEFT HEART CATHETERIZATION WITH CORONARY ANGIOGRAM;  Surgeon: Sherren Mocha, MD;  Location: [facility] CATH LAB;  Service: Cardiovascular;  Laterality: N/A;  . LEFT HEART CATHETERIZATION WITH CORONARY ANGIOGRAM N/A 11/28/2013   Procedure: LEFT HEART CATHETERIZATION WITH CORONARY ANGIOGRAM;  Surgeon: Blane Ohara, MD;  Location: [facility] CATH LAB;  Service: Cardiovascular;  Laterality: N/A;  . PERCUTANEOUS CORONARY INTERVENTION-BALLOON ONLY  07/02/2011   Procedure: PERCUTANEOUS CORONARY INTERVENTION-BALLOON ONLY;  Surgeon: Sherren Mocha, MD;  Location: [facility] CATH LAB;  Service: Cardiovascular;;  . PERCUTANEOUS CORONARY INTERVENTION-BALLOON ONLY  07/05/2012   Procedure: PERCUTANEOUS CORONARY INTERVENTION-BALLOON ONLY;  Surgeon: Sherren Mocha, MD;  Location[facility] LAB;  Service: Cardiovascular;;  . VAGINAL HYSTERECTOMY  ~ 1976   no oophorectomy    Allergies as of 10/15/2020      Reactions   Ace Inhibitors Other (See Comments)   cough      Medication List       Accurate as of October 15, 2020 11:59 PM. If you have any questions, ask your nurse  or doctor.        amLODipine 10 MG tablet Commonly known as: NORVASC Take 1 tablet (10 mg total) by mouth daily.   aspirin 325 MG tablet Take 325 mg by mouth at bedtime.   atorvastatin 80 MG tablet Commonly known as: LIPITOR Take 1 tablet (80 mg total) by mouth at bedtime.   budesonide-formoterol 160-4.5 MCG/ACT inhaler Commonly known as: SYMBICORT Inhale 2 puffs into the lungs 2 (two) times daily.   clonazePAM 0.5 MG tablet Commonly known as: KLONOPIN TAKE 1 TABLET BY MOUTH IN THE MORNING AS NEEDED FOR ANXIETY AND 1 & 1/2 TO 2 (ONE & ONE-HALF TO TWO) AT BEDTIME AS NEEDED FOR SLEEP   ezetimibe 10 MG tablet Commonly known as: ZETIA Take 1 tablet (10 mg total) by mouth daily. What changed: when to take this   FLUoxetine 20 MG capsule Commonly known as: PROZAC Take 2 capsules (40 mg total) by mouth daily. What changed: when to take this   furosemide 40 MG tablet Commonly known as: LASIX Take 40 mg by mouth daily.   hydrochlorothiazide 12.5 MG capsule Commonly known as: MICROZIDE Take 1 capsule (12.5 mg total) by mouth daily.   losartan 100 MG tablet Commonly known as: COZAAR Take 1 tablet by mouth once daily   metoprolol succinate 25 MG 24 hr tablet Commonly known as: Toprol XL Take 1 tablet (25 mg total) by mouth daily. What changed: when to take this   potassium chloride 10 MEQ tablet Commonly known as: KLOR-CON Take 1 tablet (10 mEq total) by mouth daily.   Vitamin D3 25 MCG (1000 UT) Caps Take 1,000 Units by mouth at bedtime.          Objective:   Physical Exam BP 108/64 (BP Location: Left Arm, Patient Position: Sitting, Cuff Size: Small)   Pulse 87   Temp 98.4 F (36.9 C) (Oral)   Resp 18   Ht 5\' 2"  (1.575 m)   Wt 98 lb 2 oz (44.5 kg)   SpO2 94%   BMI 17.95 kg/m  General:   Well developed, NAD, underweight appearing. HEENT:  Normocephalic . Face symmetric, atraumatic Lungs:  Decreased breath sounds Normal respiratory effort, no  intercostal retractions, no accessory muscle use. Heart: RRR,  no murmur.  Lower extremities: no pretibial edema bilaterally  Skin: Not pale. Not jaundice Neurologic:  alert & oriented X3.  Speech normal, gait appropriate for age and unassisted Psych--  Cognition and judgment appear intact.  Cooperative with normal attention span and concentration.  Behavior appropriate. No anxious or depressed appearing.      Assessment      Assessment   Hyperglycemia, A1c 5.7 HTN Hyperlipidemia Anxiety depression COPD , quit tobacco 2015 DJD CV: --CAD,cath 2012, 2013, CABG 2015 --PAD -   ABIs 2012, 07-2015 : Stable moderate disease left, normal right  --Carotid disease   --Central Retinal vein occlusion 02-2015 --Palpable aorta US abdomen  06-2015 no AA   Increase LFTs, elevated alkaline phosphatase , [place] abd neg 06-2015  Thyroid nodule --[place] 2012 stable (since 2009) Anal cancer:  DX 08-2019, status post chemotherapy.  On clinical remission as of 01/2020 Covid infex 08-2020  PLAN HTN: On amlodipine, Lasix, HCTZ, losartan, metoprolol and potassium.  Last K+ was slightly low, reports that she forgot a couple of potassium tablets, recheck a BMP. COPD exacerbation: had a  COPD exacerbation last month, Covid test came back positive, to my knowledge she did not get the infusion or treatment but she is now doing well. On Symbicort as needed mostly due to cough, she is no longer on oxygen supplements, O2 sat at home typically around 94%.  Continue present care High cholesterol: On Lipitor and Zetia, labs Vaccinations: She had a COVID booster, PNM 23 booster today. RTC 4 months   This visit occurred during the SARS-CoV-2 public health emergency.  Safety protocols were in place, including screening questions prior to the visit, additional usage of staff PPE, and extensive cleaning of exam room while observing appropriate contact time as indicated for disinfecting solutions.

## 2020-10-15 NOTE — Progress Notes (Signed)
Pre visit review using our clinic review tool, if applicable. No additional management support is needed unless otherwise documented below in the visit note. 

## 2020-10-16 ENCOUNTER — Other Ambulatory Visit: Payer: Self-pay

## 2020-10-16 ENCOUNTER — Other Ambulatory Visit: Payer: Self-pay | Admitting: General Surgery

## 2020-10-16 DIAGNOSIS — Z85048 Personal history of other malignant neoplasm of rectum, rectosigmoid junction, and anus: Secondary | ICD-10-CM | POA: Diagnosis not present

## 2020-10-16 DIAGNOSIS — I10 Essential (primary) hypertension: Secondary | ICD-10-CM

## 2020-10-16 DIAGNOSIS — C44520 Squamous cell carcinoma of anal skin: Secondary | ICD-10-CM | POA: Diagnosis not present

## 2020-10-16 NOTE — Assessment & Plan Note (Signed)
HTN: On amlodipine, Lasix, HCTZ, losartan, metoprolol and potassium.  Last K+ was slightly low, reports that she forgot a couple of potassium tablets, recheck a BMP. COPD exacerbation: had a  COPD exacerbation last month, Covid test came back positive, to my knowledge she did not get the infusion or treatment but she is now doing well. On Symbicort as needed mostly due to cough, she is no longer on oxygen supplements, O2 sat at home typically around 94%.  Continue present care High cholesterol: On Lipitor and Zetia, labs Vaccinations: She had a COVID booster, PNM 23 booster today. RTC 4 months

## 2020-10-22 ENCOUNTER — Other Ambulatory Visit: Payer: Self-pay

## 2020-10-22 MED ORDER — HYDROCHLOROTHIAZIDE 12.5 MG PO CAPS: 12.5000 mg | ORAL_CAPSULE | Freq: Every day | ORAL | 1 refills | Status: AC

## 2020-10-26 ENCOUNTER — Other Ambulatory Visit: Payer: Medicare Other

## 2020-10-29 ENCOUNTER — Telehealth: Payer: Self-pay

## 2020-10-29 NOTE — Telephone Encounter (Signed)
I spoke with the patient as we received a message from Dr. Marcello Moores that she has recurrence of her anal cancer.  I offered for her to schedule to be seen by Dr. Benay Spice this week.  She states that she is seeing Dr. Marcello Moores tomorrow and wants to have a conference call with her family regarding this prior to scheduling to be seen by Dr. Benay Spice.  I gave her my direct phone number to call me back once all of this has been done.

## 2020-10-30 ENCOUNTER — Telehealth: Payer: Self-pay

## 2020-10-30 DIAGNOSIS — C210 Malignant neoplasm of anus, unspecified: Secondary | ICD-10-CM | POA: Diagnosis not present

## 2020-10-30 NOTE — Telephone Encounter (Signed)
Patient calls back after seeing Dr. Marcello Moores.  She is desiring to come back into see Dr. Benay Spice.  I scheduled her for Monday 11/05/2020 at 3:30 to arrive by 3:15.

## 2020-10-31 ENCOUNTER — Other Ambulatory Visit: Payer: Self-pay | Admitting: Internal Medicine

## 2020-11-05 ENCOUNTER — Inpatient Hospital Stay: Payer: Medicare Other | Attending: Oncology | Admitting: Oncology

## 2020-11-05 ENCOUNTER — Telehealth: Payer: Self-pay | Admitting: Oncology

## 2020-11-05 ENCOUNTER — Other Ambulatory Visit: Payer: Self-pay

## 2020-11-05 VITALS — BP 100/60 | HR 106 | Temp 97.7°F | Resp 18 | Ht 62.0 in | Wt 95.7 lb

## 2020-11-05 DIAGNOSIS — E785 Hyperlipidemia, unspecified: Secondary | ICD-10-CM | POA: Insufficient documentation

## 2020-11-05 DIAGNOSIS — D61818 Other pancytopenia: Secondary | ICD-10-CM | POA: Insufficient documentation

## 2020-11-05 DIAGNOSIS — I10 Essential (primary) hypertension: Secondary | ICD-10-CM | POA: Diagnosis not present

## 2020-11-05 DIAGNOSIS — R110 Nausea: Secondary | ICD-10-CM | POA: Diagnosis not present

## 2020-11-05 DIAGNOSIS — I2510 Atherosclerotic heart disease of native coronary artery without angina pectoris: Secondary | ICD-10-CM | POA: Diagnosis not present

## 2020-11-05 DIAGNOSIS — C210 Malignant neoplasm of anus, unspecified: Secondary | ICD-10-CM | POA: Insufficient documentation

## 2020-11-05 DIAGNOSIS — R197 Diarrhea, unspecified: Secondary | ICD-10-CM | POA: Insufficient documentation

## 2020-11-05 DIAGNOSIS — E042 Nontoxic multinodular goiter: Secondary | ICD-10-CM | POA: Insufficient documentation

## 2020-11-05 DIAGNOSIS — F418 Other specified anxiety disorders: Secondary | ICD-10-CM | POA: Diagnosis not present

## 2020-11-05 DIAGNOSIS — T451X5A Adverse effect of antineoplastic and immunosuppressive drugs, initial encounter: Secondary | ICD-10-CM | POA: Insufficient documentation

## 2020-11-05 MED ORDER — AMLODIPINE BESYLATE 10 MG PO TABS: 10.0000 mg | ORAL_TABLET | Freq: Every day | ORAL | 1 refills | Status: AC

## 2020-11-05 NOTE — Telephone Encounter (Signed)
Scheduled appt per 3/28 los - unable to reach pt - left message for patient with appt date and time

## 2020-11-05 NOTE — Progress Notes (Signed)
[facility] OFFICE PROGRESS NOTE   Diagnosis: [name]  INTERVAL HISTORY:   Patricia [name] saw Dr. Marcello [name] on 10/16/2020.  An enlarged anterior nodule was noted on exam.  A biopsy revealed squamous cell carcinoma.  Patricia saw Dr. Marcello [name] in follow-up on 10/30/2020.  Dr. Marcello [name] recommended a staging PET scan to be followed by an APR if there is no distant disease.  Patricia [name].  Patricia is here to discuss treatment options.  Patricia feels well.  No anal pain or bleeding.  No complaint.  Objective:  Vital signs in last 24 hours:  Blood pressure 100/60, pulse (!) 106, temperature 97.7 F (36.5 C), temperature source Tympanic, resp. rate 18, height 5\' 2"  (1.575 m), weight 95 lb 11.2 oz (43.4 kg), SpO2 90 %.     Lymphatics: No cervical, supraclavicular, axillary, or inguinal nodes Resp: Distant breath sounds, inspiratory rhonchi at the left greater than right posterior chest, no respiratory distress Cardio: Regular rate and rhythm GI: No mass, no hepatosplenomegaly Vascular: No leg edema    Lab Results:  Lab Results  Component Value Date   WBC 9.4 07/16/2020   HGB 14.3 07/16/2020   HCT 42.6 07/16/2020   MCV 93.7 07/16/2020   PLT 262.0 07/16/2020   NEUTROABS 6.9 07/16/2020    CMP  Lab Results  Component Value Date   NA 140 10/15/2020   K 3.2 (L) 10/15/2020   CL 101 10/15/2020   CO2 32 10/15/2020   GLUCOSE 113 (H) 10/15/2020   BUN 8 10/15/2020   CREATININE 0.60 10/15/2020   CALCIUM 9.4 10/15/2020   PROT 6.8 07/16/2020   ALBUMIN 4.1 07/16/2020   AST 17 07/16/2020   ALT 16 07/16/2020   ALKPHOS 145 (H) 07/16/2020   BILITOT 0.5 07/16/2020   GFRNONAA >60 10/08/2020   GFRAA >60 01/16/2020     Medications: I have reviewed the patient's current medications.   Assessment/Plan: 1. [name]  09/06/2019 CT abdomen/pelvis-complex 5.1 cm x 3.2 cm x 4.1 cm posterior left perirectal fluid collection/abscess.  09/07/2019 flexible  sigmoidoscopy by Dr. Sim [name] cm x 5 cm hard fungating anal mass located predominantly at the left anterior-lateral area and extending to the dentate line with minor anal stenosis. 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  PET scan 09/22/2019-large intensely hypermetabolic exophytic mass extending posteriorly from the anus consistent with primary anal carcinoma.  Local regional hypermetabolic nodal metastasis to a left inguinal lymph node.  No iliac nodal metastasis or para-aortic nodal metastasis.  No liver or other visceral metastasis. Intensely hypermetabolic rounded right supraclavicular mass.  Cycle 1 day 1 dose-reduced 5FU/mitomycin and concurrent RT 09/26/19  Cycle two 5-FU/mitomycin-C 10/24/2019, radiation completed 11/11/2019  10/16/2020 -2 cm firm raised lesion at the 6 o'clock position, anterior anal verge  Biopsy of anterior nodule by Dr. Marcello [name] 10/16/2020-squamous cell carcinoma 2. Rectal bleedingand painsecondary to #1-improved 3. CAD/CABG 4. Hypertension - BP 91/64 on day 10 chemoRT, instructed to hold losartan and amlodipine as of 10/05/19 5. Hyperlipidemia 6. Carotid disease 7. COPD 8. Anxiety/depression 9. Nausea and diarrhea, secondary to chemoRT starting day 5, imodium partially effective. Started lomotil 10/05/19.  10. Mild pancytopenia secondary to chemo, on day 10 of chemoRT WBC 3.7, Hgb 11.4, PLT 142 11.  Hypermetabolic mass right neck on PET scan 09/22/2019.  Patricia declined further evaluation citing a long history of thyroid nodules.      Disposition: Patricia [name] has been diagnosed with local recurrence of [name].  There is no clinical evidence of distant disease.  Patricia is asymptomatic.  Patricia understands the only potentially curative treatment is [name].  Patricia discussed surgical options with Dr. Marcello [name].  Patricia [name] has decided against [name].  Patricia agrees to a restaging PET scan.  We can consider palliative chemotherapy or  immunotherapy, though these treatments will not be curative.  Patricia will return for an office visit and further discussion after the restaging PET scan.  Patricia Coder, MD  11/05/2020  3:48 PM

## 2020-11-15 ENCOUNTER — Telehealth: Payer: Self-pay

## 2020-11-15 ENCOUNTER — Other Ambulatory Visit: Payer: Self-pay | Admitting: Nurse Practitioner

## 2020-11-15 DIAGNOSIS — C210 Malignant neoplasm of anus, unspecified: Secondary | ICD-10-CM

## 2020-11-15 MED ORDER — LORAZEPAM 0.5 MG PO TABS: 0.5000 mg | ORAL_TABLET | Freq: Once | ORAL | 0 refills | Status: AC

## 2020-11-15 NOTE — Telephone Encounter (Signed)
TC from Pt requesting ativan for upcoming Pet scan. Prescription sent to pharmacy.

## 2020-11-20 ENCOUNTER — Other Ambulatory Visit: Payer: Self-pay | Admitting: Internal Medicine

## 2020-11-21 ENCOUNTER — Other Ambulatory Visit: Payer: Self-pay

## 2020-11-21 ENCOUNTER — Ambulatory Visit (HOSPITAL_COMMUNITY)
Admission: RE | Admit: 2020-11-21 | Discharge: 2020-11-21 | Disposition: A | Discharge status: Home / Self Care | Payer: Medicare Other | Source: Ambulatory Visit | Attending: Oncology | Admitting: Oncology

## 2020-11-21 DIAGNOSIS — Z79899 Other long term (current) drug therapy: Secondary | ICD-10-CM | POA: Insufficient documentation

## 2020-11-21 DIAGNOSIS — C44520 Squamous cell carcinoma of anal skin: Secondary | ICD-10-CM | POA: Diagnosis not present

## 2020-11-21 DIAGNOSIS — C210 Malignant neoplasm of anus, unspecified: Secondary | ICD-10-CM | POA: Insufficient documentation

## 2020-11-21 DIAGNOSIS — E041 Nontoxic single thyroid nodule: Secondary | ICD-10-CM | POA: Diagnosis not present

## 2020-11-21 LAB — GLUCOSE, CAPILLARY: Glucose-Capillary: 89 mg/dL (ref 70–99)

## 2020-11-21 MED ORDER — FLUDEOXYGLUCOSE F - 18 (FDG) INJECTION
5.0000 | Freq: Once | INTRAVENOUS | Status: AC | PRN
  Administered 2020-11-21: 5.15 via INTRAVENOUS

## 2020-11-22 ENCOUNTER — Inpatient Hospital Stay: Payer: Medicare Other | Attending: Oncology | Admitting: Oncology

## 2020-11-22 ENCOUNTER — Other Ambulatory Visit: Payer: Self-pay

## 2020-11-22 VITALS — BP 146/53 | HR 71 | Temp 98.1°F | Resp 20 | Ht 62.0 in | Wt 97.8 lb

## 2020-11-22 DIAGNOSIS — I2510 Atherosclerotic heart disease of native coronary artery without angina pectoris: Secondary | ICD-10-CM | POA: Insufficient documentation

## 2020-11-22 DIAGNOSIS — C775 Secondary and unspecified malignant neoplasm of intrapelvic lymph nodes: Secondary | ICD-10-CM | POA: Diagnosis not present

## 2020-11-22 DIAGNOSIS — I10 Essential (primary) hypertension: Secondary | ICD-10-CM | POA: Diagnosis not present

## 2020-11-22 DIAGNOSIS — R110 Nausea: Secondary | ICD-10-CM | POA: Diagnosis not present

## 2020-11-22 DIAGNOSIS — E785 Hyperlipidemia, unspecified: Secondary | ICD-10-CM | POA: Insufficient documentation

## 2020-11-22 DIAGNOSIS — J449 Chronic obstructive pulmonary disease, unspecified: Secondary | ICD-10-CM | POA: Insufficient documentation

## 2020-11-22 DIAGNOSIS — R197 Diarrhea, unspecified: Secondary | ICD-10-CM | POA: Insufficient documentation

## 2020-11-22 DIAGNOSIS — C210 Malignant neoplasm of anus, unspecified: Secondary | ICD-10-CM | POA: Diagnosis not present

## 2020-11-22 DIAGNOSIS — K5900 Constipation, unspecified: Secondary | ICD-10-CM | POA: Insufficient documentation

## 2020-11-22 MED ORDER — FUROSEMIDE 40 MG PO TABS: 40.0000 mg | ORAL_TABLET | Freq: Every day | ORAL | 3 refills | Status: AC

## 2020-11-22 NOTE — Progress Notes (Signed)
[place] OFFICE PROGRESS NOTE   Diagnosis: Anal cancer  INTERVAL HISTORY:   Ms. Mickiewicz returns as scheduled.  She is here with her daughter.  She feels well.  No anal pain or bleeding.  She has mild constipation.  She had a staging PET scan yesterday.  Objective:  Vital signs in last 24 hours:  Blood pressure (!) 146/53, pulse 71, temperature 98.1 F (36.7 C), temperature source Tympanic, resp. rate 20, height 5\' 2"  (1.575 m), weight 97 lb 12.8 oz (44.4 kg), SpO2 96 %.    Lymphatics: No cervical, supraclavicular, or inguinal nodes Resp: Distant breath sounds, scattered inspiratory rhonchi, no respiratory distress Cardio: Regular rate and rhythm GI: No hepatosplenomegaly, no mass, nontender Vascular: No leg edema  Portacath/PICC-without erythema  Lab Results:  Lab Results  Component Value Date   WBC 9.4 07/16/2020   HGB 14.3 07/16/2020   HCT 42.6 07/16/2020   MCV 93.7 07/16/2020   PLT 262.0 07/16/2020   NEUTROABS 6.9 07/16/2020    CMP  Lab Results  Component Value Date   NA 140 10/15/2020   K 3.2 (L) 10/15/2020   CL 101 10/15/2020   CO2 32 10/15/2020   GLUCOSE 113 (H) 10/15/2020   BUN 8 10/15/2020   CREATININE 0.60 10/15/2020   CALCIUM 9.4 10/15/2020   PROT 6.8 07/16/2020   ALBUMIN 4.1 07/16/2020   AST 17 07/16/2020   ALT 16 07/16/2020   ALKPHOS 145 (H) 07/16/2020   BILITOT 0.5 07/16/2020   GFRNONAA >60 10/08/2020   GFRAA >60 01/16/2020     Medications: I have reviewed the patient's current medications.   Assessment/Plan: 1. Anal cancer  09/06/2019 CT abdomen/pelvis-complex 5.1 cm x 3.2 cm x 4.1 cm posterior left perirectal fluid collection/abscess.  09/07/2019 flexible sigmoidoscopy by Dr. Sim Boast cm x 5 cm hard fungating anal mass located predominantly at the left anterior-lateral area and extending to the dentate line with minor anal stenosis. 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  PET  scan 09/22/2019-large intensely hypermetabolic exophytic mass extending posteriorly from the anus consistent with primary anal carcinoma.  Local regional hypermetabolic nodal metastasis to a left inguinal lymph node.  No iliac nodal metastasis or para-aortic nodal metastasis.  No liver or other visceral metastasis. Intensely hypermetabolic rounded right supraclavicular mass.  Cycle 1 day 1 dose-reduced 5FU/mitomycin and concurrent RT 09/26/19  Cycle two 5-FU/mitomycin-C 10/24/2019, radiation completed 11/11/2019  10/16/2020 -2 cm firm raised lesion at the 6 o'clock position, anterior anal verge  Biopsy of anterior nodule by Dr. Marcello Moores 10/16/2020-squamous cell carcinoma  PET scan 11/21/2020 2. Rectal bleedingand painsecondary to #1-improved 3. CAD/CABG 4. Hypertension - BP 91/64 on day 10 chemoRT, instructed to hold losartan and amlodipine as of 10/05/19 5. Hyperlipidemia 6. Carotid disease 7. COPD 8. Anxiety/depression 9. Nausea and diarrhea, secondary to chemoRT starting day 5, imodium partially effective. Started lomotil 10/05/19.  10. Mild pancytopenia secondary to chemo, on day 10 of chemoRT WBC 3.7, Hgb 11.4, PLT 142 11.  Hypermetabolic mass right neck on PET scan 09/22/2019.  She declined further evaluation citing a long history of thyroid nodules.  Disposition: Ms. Berthold appears stable.  We reviewed the PET images from 11/21/2020.  There is increased activity at the anus and at the right thyroid.  I see no evidence of metastatic disease.  We are waiting on the final report.  I discussed treatment options with Ms. Sundeen.  She has decided against surgery.  We discussed immunotherapy and chemotherapy.  She understands  no therapy will be curative if she does not undergo surgery.  She would like to continue observation for now.  She agrees to a follow-up visit in approximately 6 weeks.  She will call in the interim for new symptoms.  She will begin a stool softener.  Betsy Coder,  MD  11/22/2020  3:32 PM

## 2020-11-23 ENCOUNTER — Telehealth: Payer: Self-pay | Admitting: *Deleted

## 2020-11-23 NOTE — Telephone Encounter (Signed)
Notified of hypermetabolic right thyroid nodule and recommendation for biopsy--she declines. Also made aware that surgical resection of anal tumor could potentially be curative--she reports she has already spoken with Dr. Marcello Moores about this and does not want to pursue this. She agrees to follow up as scheduled.

## 2020-11-23 NOTE — Telephone Encounter (Signed)
-----   Message from Ladell Pier, MD sent at 11/22/2020  5:02 PM EDT ----- Please call patient, the final PET report reveals a hypermetabolic right thyroid nodule as before, a biopsy is recommended-will she agree?,  No other evidence of metastatic disease Surgical resection of the anal tumor could potentially be curative, will she consider surgery?  Follow-up as scheduled

## 2020-12-10 ENCOUNTER — Other Ambulatory Visit: Payer: Self-pay | Admitting: Physician Assistant

## 2020-12-10 ENCOUNTER — Other Ambulatory Visit: Payer: Self-pay | Admitting: Internal Medicine

## 2020-12-11 ENCOUNTER — Other Ambulatory Visit: Payer: Self-pay

## 2020-12-11 MED ORDER — CLONAZEPAM 0.5 MG PO TABS: ORAL_TABLET | ORAL | 2 refills | Status: AC

## 2020-12-11 NOTE — Progress Notes (Signed)
PDMP okay, Rx sent 

## 2020-12-11 NOTE — Progress Notes (Signed)
Requesting: Clonazepam  Contract: 05/29/20 UDS: 05/29/20 Last Visit: 10/15/20 Next Visit: 02/15/21 Last Refill: 07/31/20  Please Advise

## 2021-01-03 ENCOUNTER — Inpatient Hospital Stay: Payer: Medicare Other | Attending: Oncology | Admitting: Nurse Practitioner

## 2021-01-03 ENCOUNTER — Encounter: Payer: Self-pay | Admitting: Nurse Practitioner

## 2021-01-03 ENCOUNTER — Other Ambulatory Visit: Payer: Self-pay

## 2021-01-03 VITALS — BP 141/62 | HR 93 | Temp 97.8°F | Resp 20 | Ht 62.0 in | Wt 97.6 lb

## 2021-01-03 DIAGNOSIS — I2510 Atherosclerotic heart disease of native coronary artery without angina pectoris: Secondary | ICD-10-CM | POA: Diagnosis not present

## 2021-01-03 DIAGNOSIS — K5900 Constipation, unspecified: Secondary | ICD-10-CM | POA: Insufficient documentation

## 2021-01-03 DIAGNOSIS — Z9221 Personal history of antineoplastic chemotherapy: Secondary | ICD-10-CM | POA: Insufficient documentation

## 2021-01-03 DIAGNOSIS — E785 Hyperlipidemia, unspecified: Secondary | ICD-10-CM | POA: Insufficient documentation

## 2021-01-03 DIAGNOSIS — F418 Other specified anxiety disorders: Secondary | ICD-10-CM | POA: Diagnosis not present

## 2021-01-03 DIAGNOSIS — G893 Neoplasm related pain (acute) (chronic): Secondary | ICD-10-CM | POA: Diagnosis not present

## 2021-01-03 DIAGNOSIS — R110 Nausea: Secondary | ICD-10-CM | POA: Diagnosis not present

## 2021-01-03 DIAGNOSIS — I10 Essential (primary) hypertension: Secondary | ICD-10-CM | POA: Diagnosis not present

## 2021-01-03 DIAGNOSIS — E041 Nontoxic single thyroid nodule: Secondary | ICD-10-CM | POA: Diagnosis not present

## 2021-01-03 DIAGNOSIS — R221 Localized swelling, mass and lump, neck: Secondary | ICD-10-CM | POA: Diagnosis not present

## 2021-01-03 DIAGNOSIS — R197 Diarrhea, unspecified: Secondary | ICD-10-CM | POA: Insufficient documentation

## 2021-01-03 DIAGNOSIS — J449 Chronic obstructive pulmonary disease, unspecified: Secondary | ICD-10-CM | POA: Diagnosis not present

## 2021-01-03 DIAGNOSIS — C210 Malignant neoplasm of anus, unspecified: Secondary | ICD-10-CM | POA: Diagnosis not present

## 2021-01-03 DIAGNOSIS — Z923 Personal history of irradiation: Secondary | ICD-10-CM | POA: Diagnosis not present

## 2021-01-03 NOTE — Progress Notes (Addendum)
[place] OFFICE PROGRESS NOTE   Diagnosis: Anal cancer  INTERVAL HISTORY:   Patricia [name] returns as scheduled.  She reports constipation, mild discomfort and periodic bleeding with bowel movements.  Objective:  Vital signs in last 24 hours:  Blood pressure (!) 141/62, pulse 93, temperature 97.8 F (36.6 C), temperature source Oral, resp. rate 20, height 5\' 2"  (1.575 m), weight 97 lb 9.6 oz (44.3 kg), SpO2 93 %.   Lymphatics: No palpable cervical, supraclavicular or inguinal lymph nodes. Resp: Distant breath sounds.  No respiratory distress. Cardio: Regular rate and rhythm. GI: Abdomen soft and nontender.  No hepatosplenomegaly.  Anal/rectal examination declined. Vascular: No leg edema.  Lab Results:  Lab Results  Component Value Date   WBC 9.4 07/16/2020   HGB 14.3 07/16/2020   HCT 42.6 07/16/2020   MCV 93.7 07/16/2020   PLT 262.0 07/16/2020   NEUTROABS 6.9 07/16/2020    Imaging:  No results found.  Medications: I have reviewed the patient's current medications.  Assessment/Plan: Assessment/Plan: 1. Anal cancer  09/06/2019 CT abdomen/pelvis-complex 5.1 cm x 3.2 cm x 4.1 cm posterior left perirectal fluid collection/abscess.  09/07/2019 flexible sigmoidoscopy by Dr. Sim Boast cm x 5 cm hard fungating anal mass located predominantly at the left anterior-lateral area and extending to the dentate line with minor anal stenosis. 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  PET scan 09/22/2019-large intensely hypermetabolic exophytic mass extending posteriorly from the anus consistent with primary anal carcinoma.  Local regional hypermetabolic nodal metastasis to a left inguinal lymph node.  No iliac nodal metastasis or para-aortic nodal metastasis.  No liver or other visceral metastasis. Intensely hypermetabolic rounded right supraclavicular mass.  Cycle 1 day 1 dose-reduced 5FU/mitomycin and concurrent RT 09/26/19  Cycle  two 5-FU/mitomycin-C 10/24/2019, radiation completed 11/11/2019  10/16/2020 -2 cm firm raised lesion at the 6 o'clock position, anterior anal verge  Biopsy of anterior nodule by Dr. Marcello Moores 10/16/2020-squamous cell carcinoma  PET scan 11/21/2020- interval decrease in size and decreased metabolic activity in the anal mass.  No findings suspicious for metastatic disease.  2.7 cm hypermetabolic left thyroid nodule slightly more hypermetabolic. 2. Rectal bleedingand painsecondary to #1-improved 3. CAD/CABG 4. Hypertension - BP 91/64 on day 10 chemoRT, instructed to hold losartan and amlodipine as of 10/05/19 5. Hyperlipidemia 6. Carotid disease 7. COPD 8. Anxiety/depression 9. Nausea and diarrhea, secondary to chemoRT starting day 5, imodium partially effective. Started lomotil 10/05/19.  10. Mild pancytopenia secondary to chemo, on day 10 of chemoRT WBC 3.7, Hgb 11.4, PLT 142 11.  Hypermetabolic mass right neck on PET scan 09/22/2019.  She declined further evaluation citing a long history of thyroid nodules.  Disposition: Patricia [name] appears unchanged.  She is accompanied to today's appointment by her niece.  They understand there is a local recurrence of the anal cancer and the only potentially curative therapy is surgery.  Patricia [name] has decided against surgery.  She understands she cannot receive further radiation.  Dr. Benay Spice reviewed systemic treatment options including chemotherapy or immunotherapy.  She is interested in immunotherapy.  Dr. Benay Spice recommends nivolumab initially every 2 weeks.  We discussed potential toxicities including skin rash, diarrhea (potentially severe), endocrinopathies, hepatitis, pneumonitis, arthritis, leukoencephalopathy.  Prior to beginning treatment for the anal cancer she would like to have the right thyroid nodule biopsied.  We will try to get this done in the next 2 weeks and see her back on 01/29/2021 to plan a start date for nivolumab.  She agrees with  this  plan.  Patient seen with Dr. Benay Spice.    Ned Card ANP/GNP-BC   01/03/2021  2:21 PM  This was a shared visit with Ned Card.  We reviewed the PET findings and discussed treatment options with Patricia [name].  She has decided against surgery for the local recurrence of anal cancer.  She would like to proceed with a biopsy of the thyroid mass prior to starting systemic therapy.  I was present for greater than 50% of today's visit.  I performed medical decision making.  Julieanne Manson, MD

## 2021-01-04 ENCOUNTER — Encounter: Payer: Self-pay | Admitting: Oncology

## 2021-01-04 ENCOUNTER — Encounter: Payer: Self-pay | Admitting: Nurse Practitioner

## 2021-01-08 ENCOUNTER — Other Ambulatory Visit: Payer: Self-pay | Admitting: Nurse Practitioner

## 2021-01-08 ENCOUNTER — Telehealth: Payer: Self-pay

## 2021-01-08 DIAGNOSIS — C210 Malignant neoplasm of anus, unspecified: Secondary | ICD-10-CM

## 2021-01-08 DIAGNOSIS — E041 Nontoxic single thyroid nodule: Secondary | ICD-10-CM

## 2021-01-08 NOTE — Telephone Encounter (Signed)
Returned call to pt concerning thyroid biopsy made pt aware provider placed referral and that department will call to schedule procedures,this writer will call tomorrow to inquire as to whether or not referral was recieved

## 2021-01-11 ENCOUNTER — Ambulatory Visit (HOSPITAL_COMMUNITY)
Admission: RE | Admit: 2021-01-11 | Discharge: 2021-01-11 | Disposition: A | Discharge status: Home / Self Care | Payer: Medicare Other | Source: Ambulatory Visit | Attending: Nurse Practitioner | Admitting: Nurse Practitioner

## 2021-01-11 ENCOUNTER — Other Ambulatory Visit: Payer: Self-pay

## 2021-01-11 DIAGNOSIS — E041 Nontoxic single thyroid nodule: Secondary | ICD-10-CM

## 2021-01-11 DIAGNOSIS — C210 Malignant neoplasm of anus, unspecified: Secondary | ICD-10-CM | POA: Diagnosis not present

## 2021-01-22 ENCOUNTER — Other Ambulatory Visit: Payer: Medicare Other

## 2021-01-23 ENCOUNTER — Ambulatory Visit
Admission: RE | Admit: 2021-01-23 | Discharge: 2021-01-23 | Disposition: A | Discharge status: Home / Self Care | Payer: Medicare Other | Source: Ambulatory Visit | Attending: Nurse Practitioner | Admitting: Nurse Practitioner

## 2021-01-23 ENCOUNTER — Other Ambulatory Visit (HOSPITAL_COMMUNITY)
Admission: RE | Admit: 2021-01-23 | Discharge: 2021-01-23 | Disposition: A | Discharge status: Home / Self Care | Payer: Medicare Other | Source: Ambulatory Visit | Attending: Nurse Practitioner | Admitting: Nurse Practitioner

## 2021-01-23 DIAGNOSIS — E041 Nontoxic single thyroid nodule: Secondary | ICD-10-CM | POA: Insufficient documentation

## 2021-01-23 DIAGNOSIS — C210 Malignant neoplasm of anus, unspecified: Secondary | ICD-10-CM | POA: Diagnosis not present

## 2021-01-25 LAB — CYTOLOGY - NON PAP

## 2021-01-28 ENCOUNTER — Other Ambulatory Visit: Payer: Self-pay | Admitting: Cardiovascular Disease

## 2021-01-28 NOTE — Telephone Encounter (Signed)
Rx(s) sent to pharmacy electronically.  

## 2021-01-29 ENCOUNTER — Encounter: Payer: Self-pay | Admitting: Nurse Practitioner

## 2021-01-29 ENCOUNTER — Other Ambulatory Visit: Payer: Self-pay

## 2021-01-29 ENCOUNTER — Inpatient Hospital Stay: Payer: Medicare Other | Attending: Oncology | Admitting: Nurse Practitioner

## 2021-01-29 VITALS — BP 122/98 | HR 85 | Temp 98.3°F | Resp 18 | Ht 62.0 in | Wt 101.4 lb

## 2021-01-29 DIAGNOSIS — Z5112 Encounter for antineoplastic immunotherapy: Secondary | ICD-10-CM | POA: Insufficient documentation

## 2021-01-29 DIAGNOSIS — D61818 Other pancytopenia: Secondary | ICD-10-CM | POA: Insufficient documentation

## 2021-01-29 DIAGNOSIS — G893 Neoplasm related pain (acute) (chronic): Secondary | ICD-10-CM | POA: Diagnosis not present

## 2021-01-29 DIAGNOSIS — E785 Hyperlipidemia, unspecified: Secondary | ICD-10-CM | POA: Insufficient documentation

## 2021-01-29 DIAGNOSIS — I10 Essential (primary) hypertension: Secondary | ICD-10-CM | POA: Diagnosis not present

## 2021-01-29 DIAGNOSIS — F418 Other specified anxiety disorders: Secondary | ICD-10-CM | POA: Diagnosis not present

## 2021-01-29 DIAGNOSIS — R5383 Other fatigue: Secondary | ICD-10-CM | POA: Diagnosis not present

## 2021-01-29 DIAGNOSIS — C210 Malignant neoplasm of anus, unspecified: Secondary | ICD-10-CM | POA: Insufficient documentation

## 2021-01-29 DIAGNOSIS — J449 Chronic obstructive pulmonary disease, unspecified: Secondary | ICD-10-CM | POA: Diagnosis not present

## 2021-01-29 DIAGNOSIS — T451X5A Adverse effect of antineoplastic and immunosuppressive drugs, initial encounter: Secondary | ICD-10-CM | POA: Insufficient documentation

## 2021-01-29 DIAGNOSIS — R110 Nausea: Secondary | ICD-10-CM | POA: Insufficient documentation

## 2021-01-29 DIAGNOSIS — I2510 Atherosclerotic heart disease of native coronary artery without angina pectoris: Secondary | ICD-10-CM | POA: Diagnosis not present

## 2021-01-29 DIAGNOSIS — R197 Diarrhea, unspecified: Secondary | ICD-10-CM | POA: Insufficient documentation

## 2021-01-29 DIAGNOSIS — Z7189 Other specified counseling: Secondary | ICD-10-CM | POA: Diagnosis not present

## 2021-01-29 NOTE — Progress Notes (Signed)
[place] [facility] OFFICE PROGRESS NOTE   Diagnosis: Anal cancer  INTERVAL HISTORY:   Patricia [name] returns as scheduled.  Some pain and bleeding with bowel movements.  She notes some improvement with stool softeners.  Objective:  Vital signs in last 24 hours:  Blood pressure (!) 122/98, pulse 85, temperature 98.3 F (36.8 C), temperature source Oral, resp. rate 18, height 5\' 2"  (1.575 m), weight 101 lb 6.4 oz (46 kg), SpO2 91 %.   Lymphatics: No palpable inguinal lymph nodes. Resp: Distant breath sounds. Cardio: Regular rate and rhythm. GI: No hepatosplenomegaly. Vascular: No leg edema.   Lab Results:  Lab Results  Component Value Date   WBC 9.4 07/16/2020   HGB 14.3 07/16/2020   HCT 42.6 07/16/2020   MCV 93.7 07/16/2020   PLT 262.0 07/16/2020   NEUTROABS 6.9 07/16/2020    Imaging:  No results found.  Medications: I have reviewed the patient's current medications.  Assessment/Plan: Anal cancer 09/06/2019 CT abdomen/pelvis-complex 5.1 cm x 3.2 cm x 4.1 cm posterior left perirectal fluid collection/abscess.   09/07/2019 flexible sigmoidoscopy by Dr. Sim Boast cm x 5 cm hard fungating anal mass located predominantly at the left anterior-lateral area and extending to the dentate line with minor anal stenosis.  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 PET scan 09/22/2019-large intensely hypermetabolic exophytic mass extending posteriorly from the anus consistent with primary anal carcinoma.  Local regional hypermetabolic nodal metastasis to a left inguinal lymph node.  No iliac nodal metastasis or para-aortic nodal metastasis.  No liver or other visceral metastasis. Intensely hypermetabolic rounded right supraclavicular mass. Cycle 1 day 1 dose-reduced 5FU/mitomycin and concurrent RT 09/26/19  Cycle two 5-FU/mitomycin-C 10/24/2019, radiation completed 11/11/2019 10/16/2020 -2 cm firm raised lesion at the 6 o'clock position, anterior anal  verge Biopsy of anterior nodule by Dr. Marcello Moores 10/16/2020-squamous cell carcinoma PET scan 11/21/2020- interval decrease in size and decreased metabolic activity in the anal mass.  No findings suspicious for metastatic disease.  2.7 cm hypermetabolic left thyroid nodule slightly more hypermetabolic. Thyroid ultrasound 01/11/2021-right mid thyroid nodule measuring 3.4 cm, FNA recommended. 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 Nausea and diarrhea, secondary to chemoRT starting day 5, imodium partially effective. Started lomotil 10/05/19.   10. Mild pancytopenia secondary to chemo, on day 10 of chemoRT WBC 3.7, Hgb 11.4, PLT 142  11.  Hypermetabolic mass right neck on PET scan 09/22/2019.  She declined further evaluation citing a long history of thyroid nodules.  Thyroid ultrasound 01/11/2021-right mid thyroid nodule measuring 3.4 cm, FNA recommended.  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  Disposition: Patricia [name] appears unchanged.  Dr. Benay Spice reviewed the results of the thyroid FNA.  We discussed referral to a surgeon.  She prefers to hold on this for now.  She has an upcoming appointment with her PCP and will discuss further.  She would like to focus on the anal cancer and wants to begin treatment with immunotherapy as we had previously discussed (nivolumab every 2-weeks).  We again reviewed potential toxicities.  She would like to start treatment as soon as possible.  She will return for baseline labs and cycle 1 nivolumab on 02/04/2021.  We will see her in follow-up prior to cycle 2 on 02/18/2021.  Patient seen with  Dr. Benay Spice.    Ned Card ANP/GNP-BC   01/29/2021  1:32 PM This was a shared visit with  Ned Card.  We discussed results of the thyroid biopsy with Patricia [name].  I recommend a referral to consider surgical resection.  She does not wish to consider surgery.  We discussed treatment of the locally recurrent anal cancer.  She again stated that she does not wish to consider surgery.  We discussed chemotherapy and immunotherapy options.  She prefers a trial of a PD-1 inhibitor.  She will begin treatment with nivolumab.  We reviewed potential toxicities associated with nivolumab.  I was present for greater than 50% of today's visit.  I performed medical decision making.  Julieanne Manson, MD

## 2021-02-04 ENCOUNTER — Other Ambulatory Visit: Payer: Self-pay | Admitting: *Deleted

## 2021-02-04 ENCOUNTER — Inpatient Hospital Stay: Payer: Medicare Other

## 2021-02-04 ENCOUNTER — Other Ambulatory Visit: Payer: Self-pay

## 2021-02-04 ENCOUNTER — Encounter: Payer: Self-pay | Admitting: Internal Medicine

## 2021-02-04 VITALS — BP 136/50 | HR 69 | Resp 18

## 2021-02-04 DIAGNOSIS — G893 Neoplasm related pain (acute) (chronic): Secondary | ICD-10-CM | POA: Diagnosis not present

## 2021-02-04 DIAGNOSIS — C210 Malignant neoplasm of anus, unspecified: Secondary | ICD-10-CM | POA: Diagnosis not present

## 2021-02-04 DIAGNOSIS — E785 Hyperlipidemia, unspecified: Secondary | ICD-10-CM | POA: Diagnosis not present

## 2021-02-04 DIAGNOSIS — I2510 Atherosclerotic heart disease of native coronary artery without angina pectoris: Secondary | ICD-10-CM | POA: Diagnosis not present

## 2021-02-04 DIAGNOSIS — Z5112 Encounter for antineoplastic immunotherapy: Secondary | ICD-10-CM | POA: Diagnosis not present

## 2021-02-04 DIAGNOSIS — I10 Essential (primary) hypertension: Secondary | ICD-10-CM | POA: Diagnosis not present

## 2021-02-04 DIAGNOSIS — R5383 Other fatigue: Secondary | ICD-10-CM

## 2021-02-04 LAB — CMP (CANCER CENTER ONLY)
ALT: 17 U/L (ref 0–44)
AST: 23 U/L (ref 15–41)
Albumin: 3.8 g/dL (ref 3.5–5.0)
Alkaline Phosphatase: 118 U/L (ref 38–126)
Anion gap: 8 (ref 5–15)
BUN: 20 mg/dL (ref 8–23)
CO2: 29 mmol/L (ref 22–32)
Calcium: 9.9 mg/dL (ref 8.9–10.3)
Chloride: 102 mmol/L (ref 98–111)
Creatinine: 0.7 mg/dL (ref 0.44–1.00)
GFR, Estimated: >60 mL/min (ref >60)
Glucose, Bld: 93 mg/dL (ref 70–99)
Potassium: 4.1 mmol/L (ref 3.5–5.1)
Sodium: 139 mmol/L (ref 135–145)
Total Bilirubin: 0.4 mg/dL (ref 0.3–1.2)
Total Protein: 6.3 g/dL — ABNORMAL LOW (ref 6.5–8.1)

## 2021-02-04 LAB — CBC WITH DIFFERENTIAL (CANCER CENTER ONLY)
Abs Immature Granulocytes: 0.04 10*3/uL (ref 0.00–0.07)
Basophils Absolute: 0.1 10*3/uL (ref 0.0–0.1)
Basophils Relative: 1 %
Eosinophils Absolute: 0.1 10*3/uL (ref 0.0–0.5)
Eosinophils Relative: 2 %
HCT: 40.5 % (ref 36.0–46.0)
Hemoglobin: 13.1 g/dL (ref 12.0–15.0)
Immature Granulocytes: 1 %
Lymphocytes Relative: 21 %
Lymphs Abs: 1.6 10*3/uL (ref 0.7–4.0)
MCH: 30 pg (ref 26.0–34.0)
MCHC: 32.3 g/dL (ref 30.0–36.0)
MCV: 92.9 fL (ref 80.0–100.0)
Monocytes Absolute: 0.6 10*3/uL (ref 0.1–1.0)
Monocytes Relative: 8 %
Neutro Abs: 5.2 10*3/uL (ref 1.7–7.7)
Neutrophils Relative %: 67 %
Platelet Count: 254 10*3/uL (ref 150–400)
RBC: 4.36 MIL/uL (ref 3.87–5.11)
RDW: 13.5 % (ref 11.5–15.5)
WBC Count: 7.6 10*3/uL (ref 4.0–10.5)
nRBC: 0 % (ref 0.0–0.2)

## 2021-02-04 LAB — TSH: TSH: 2.193 u[IU]/mL (ref 0.350–4.500)

## 2021-02-04 MED ORDER — SODIUM CHLORIDE 0.9 % IV SOLN
240.0000 mg | Freq: Once | INTRAVENOUS | Status: AC
  Administered 2021-02-04: 240 mg via INTRAVENOUS
  Filled 2021-02-04: qty 24

## 2021-02-04 MED ORDER — SODIUM CHLORIDE 0.9 % IV SOLN
Freq: Once | INTRAVENOUS | Status: AC
  Filled 2021-02-04: qty 250

## 2021-02-04 NOTE — Patient Instructions (Signed)
[facility]  Discharge Instructions: Thank you for choosing [facility] to provide your oncology and hematology care.   If you have a lab appointment with the [facility], please go directly to the [facility] and check in at the registration area.   Wear comfortable clothing and clothing appropriate for easy access to any Portacath or PICC line.   We strive to give you quality time with your provider. You may need to reschedule your appointment if you arrive late (15 or more minutes).  Arriving late affects you and other patients whose appointments are after yours.  Also, if you miss three or more appointments without notifying the office, you may be dismissed from the clinic at the provider's discretion.      For prescription refill requests, have your pharmacy contact our office and allow 72 hours for refills to be completed.    Today you received the following chemotherapy and/or immunotherapy agents Opdivo      To help prevent nausea and vomiting after your treatment, we encourage you to take your nausea medication as directed.  BELOW ARE SYMPTOMS THAT SHOULD BE REPORTED IMMEDIATELY: *FEVER GREATER THAN 100.4 F (38 C) OR HIGHER *CHILLS OR SWEATING *NAUSEA AND VOMITING THAT IS NOT CONTROLLED WITH YOUR NAUSEA MEDICATION *UNUSUAL SHORTNESS OF BREATH *UNUSUAL BRUISING OR BLEEDING *URINARY PROBLEMS (pain or burning when urinating, or frequent urination) *BOWEL PROBLEMS (unusual diarrhea, constipation, pain near the anus) TENDERNESS IN MOUTH AND THROAT WITH OR WITHOUT PRESENCE OF ULCERS (sore throat, sores in mouth, or a toothache) UNUSUAL RASH, SWELLING OR PAIN  UNUSUAL VAGINAL DISCHARGE OR ITCHING   Items with * indicate a potential emergency and should be followed up as soon as possible or go to the Emergency Department if any problems should occur.  Please show the CHEMOTHERAPY ALERT CARD or IMMUNOTHERAPY ALERT CARD at check-in to the  Emergency Department and triage nurse.  Should you have questions after your visit or need to cancel or reschedule your appointment, please contact [facility]  Dept: [phone]  and follow the prompts.  Office hours are 8:00 a.m. to 4:30 p.m. Monday - Friday. Please note that voicemails left after 4:00 p.m. may not be returned until the following business day.  We are closed weekends and major holidays. You have access to a nurse at all times for urgent questions. Please call the main number to the clinic Dept: [phone] and follow the prompts.   For any non-urgent questions, you may also contact your provider using MyChart. We now offer e-Visits for anyone 67 and older to request care online for non-urgent symptoms. For details visit [email or web address].   Also download the MyChart app! Go to the app store, search "MyChart", open the app, select [facility], and log in with your MyChart username and password.  Due to Covid, a mask is required upon entering the hospital/clinic. If you do not have a mask, one will be given to you upon arrival. For doctor visits, patients may have 1 support person aged 45 or older with them. For treatment visits, patients cannot have anyone with them due to current Covid guidelines and our immunocompromised population.

## 2021-02-04 NOTE — Progress Notes (Signed)
Pharmacy requested TSH today. Order already in.

## 2021-02-05 ENCOUNTER — Inpatient Hospital Stay (HOSPITAL_COMMUNITY): Admission: RE | Admit: 2021-02-05 | Payer: Medicare Other | Source: Ambulatory Visit

## 2021-02-05 ENCOUNTER — Telehealth: Payer: Self-pay | Admitting: Emergency Medicine

## 2021-02-05 NOTE — Telephone Encounter (Signed)
24 hour call back telephone call made. No answer, left voicemail message.

## 2021-02-06 ENCOUNTER — Ambulatory Visit (INDEPENDENT_AMBULATORY_CARE_PROVIDER_SITE_OTHER): Payer: Medicare Other

## 2021-02-06 VITALS — Ht 62.0 in | Wt 101.0 lb

## 2021-02-06 DIAGNOSIS — Z0000 Encounter for general adult medical examination without abnormal findings: Secondary | ICD-10-CM | POA: Diagnosis not present

## 2021-02-06 DIAGNOSIS — Z780 Asymptomatic menopausal state: Secondary | ICD-10-CM | POA: Diagnosis not present

## 2021-02-06 NOTE — Progress Notes (Addendum)
Subjective:   Patricia [name] is a 80 y.o. female who presents for Medicare Annual (Subsequent) preventive examination.  I connected with [name] today by telephone and verified that I am speaking with the correct person using two identifiers. Location patient: home Location provider: work Persons participating in the virtual visit: patient, Marine scientist.    I discussed the limitations, risks, security and privacy concerns of performing an evaluation and management service by telephone and the availability of in person appointments. I also discussed with the patient that there may be a patient responsible charge related to this service. The patient expressed understanding and verbally consented to this telephonic visit.    Interactive audio and video telecommunications were attempted between this provider and patient, however failed, due to patient having technical difficulties OR patient did not have access to video capability.  We continued and completed visit with audio only.  Some vital signs may be absent or patient reported.   Time Spent with patient on telephone encounter: 20 minutes   Review of Systems     Cardiac Risk Factors include: advanced age (>36men, >77 women);hypertension;dyslipidemia     Objective:    Today's Vitals   02/06/21 1338  Weight: 101 lb (45.8 kg)  Height: 5\' 2"  (1.575 m)   Body mass index is 18.47 kg/m.  Advanced Directives 02/06/2021 11/05/2020 10/08/2020 07/07/2020 01/26/2020 12/06/2019 09/20/2019  Does Patient Have a Medical Advance Directive? Yes Yes Yes No Yes Yes Yes  Type of Paramedic of [place];Living will [place];Living will [place];Living will - [place];Living will [place];Living will [place];Living will  Does patient want to make changes to medical advance directive? - No - Patient declined No - Patient declined - No -  Patient declined No - Patient declined No - Patient declined  Copy of [place] in Chart? Yes - validated most recent copy scanned in chart (See row information) Yes - validated most recent copy scanned in chart (See row information) Yes - validated most recent copy scanned in chart (See row information) - Yes - validated most recent copy scanned in chart (See row information) Yes - validated most recent copy scanned in chart (See row information) -  Pre-existing out of facility DNR order (yellow form or pink MOST form) - - - - - - -    Current Medications (verified) Outpatient Encounter Medications as of 02/06/2021  Medication Sig   amLODipine (NORVASC) 10 MG tablet Take 1 tablet (10 mg total) by mouth daily.   aspirin 325 MG tablet Take 325 mg by mouth at bedtime.    atorvastatin (LIPITOR) 80 MG tablet Take 1 tablet (80 mg total) by mouth at bedtime.   Cholecalciferol (VITAMIN D3) 1000 UNITS CAPS Take 1,000 Units by mouth at bedtime.    clonazePAM (KLONOPIN) 0.5 MG tablet 1 po AM and 1.5-2 po q pm prn   FLUoxetine (PROZAC) 20 MG capsule Take 2 capsules (40 mg total) by mouth daily.   furosemide (LASIX) 40 MG tablet Take 1 tablet (40 mg total) by mouth daily.   hydrochlorothiazide (MICROZIDE) 12.5 MG capsule Take 1 capsule (12.5 mg total) by mouth daily.   losartan (COZAAR) 100 MG tablet Take 1 tablet by mouth once daily   metoprolol succinate (TOPROL-XL) 25 MG 24 hr tablet Take 1 tablet (25 mg total) by mouth at bedtime. PLEASE CONTACT OFFICE FOR AN APPOINTMENT TO GET ADDITIONAL REFILLS 2ND ATTEMPT  potassium chloride (KLOR-CON) 10 MEQ tablet Take 1 tablet (10 mEq total) by mouth daily.   budesonide-formoterol (SYMBICORT) 160-4.5 MCG/ACT inhaler Inhale 2 puffs into the lungs 2 (two) times daily.   No facility-administered encounter medications on file as of 02/06/2021.    Allergies (verified) Ace inhibitors   History: Past Medical History:  Diagnosis Date   Abnormal  LFTs    Allergic rhinitis    Anxiety and depression    son commited suicide 2010   Arthritis    In her thumb   Branch retinal artery occlusion    Echocardiogram 8/21:  EF 59, no RWMA, mild LVH, Gr 1 DD, normal RVSF, mild MR [no intracardiac source of embolism]   CAD (coronary artery disease)    s/p CABG   Cancer (HCC)    anal   Carotid artery disease (HCC)    Moderate - f/u dopplers needed 11/2012   Central retinal vein occlusion of left eye 02/2015   Receiving injections in eye every 5 weeks by Dr. Zadie Rhine   COPD (chronic obstructive pulmonary disease) ([place])    HTN (hypertension)    intolerant to ACEi (cough)   Hyperlipidemia    Osteopenia    PAD (peripheral artery disease) ([place])    Carotid dz as  below, and at [facility] 11/12:  critical stenosis noted just below the sheath site leading into a totally occluded SFA and a patent deep femoral artery   Panic attacks    Thyroid nodule    Past Surgical History:  Procedure Laterality Date   BIOPSY  09/07/2019   Procedure: BIOPSY;  Surgeon: Jackquline Denmark, MD;  Location: [facility];  Service: Gastroenterology;;   CORONARY ANGIOPLASTY WITH STENT PLACEMENT     "1; makes total of 3" (07/05/2012)   CORONARY ARTERY BYPASS GRAFT N/A 12/15/2013   Procedure: CORONARY ARTERY BYPASS GRAFTING (CABG);  Surgeon: Gaye Pollack, MD;  Location: [place];  Service: Open Heart Surgery;  Laterality: N/A;  Times 3 using left internal mammary artery and endoscopically harvested right saphenous vein    FLEXIBLE SIGMOIDOSCOPY N/A 09/07/2019   Procedure: FLEXIBLE SIGMOIDOSCOPY;  Surgeon: Jackquline Denmark, MD;  Location: [facility];  Service: Gastroenterology;  Laterality: N/A;   INTRAOPERATIVE TRANSESOPHAGEAL ECHOCARDIOGRAM N/A 12/15/2013   Procedure: INTRAOPERATIVE TRANSESOPHAGEAL ECHOCARDIOGRAM;  Surgeon: Gaye Pollack, MD;  Location: [place] OR;  Service: Open Heart Surgery;  Laterality: N/A;   KIDNEY SURGERY  1960's   "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; called it  floating; had OR to put it where it belongs" (07/05/2012)   LEFT HEART CATHETERIZATION WITH CORONARY ANGIOGRAM N/A 07/02/2011   Procedure: LEFT HEART CATHETERIZATION WITH CORONARY ANGIOGRAM;  Surgeon: Sherren Mocha, MD;  Location: [facility] CATH LAB;  Service: Cardiovascular;  Laterality: N/A;   LEFT HEART CATHETERIZATION WITH CORONARY ANGIOGRAM N/A 07/05/2012   Procedure: LEFT HEART CATHETERIZATION WITH CORONARY ANGIOGRAM;  Surgeon: Sherren Mocha, MD;  Location: [facility] CATH LAB;  Service: Cardiovascular;  Laterality: N/A;   LEFT HEART CATHETERIZATION WITH CORONARY ANGIOGRAM N/A 11/28/2013   Procedure: LEFT HEART CATHETERIZATION WITH CORONARY ANGIOGRAM;  Surgeon: Blane Ohara, MD;  Location: [facility] CATH LAB;  Service: Cardiovascular;  Laterality: N/A;   PERCUTANEOUS CORONARY INTERVENTION-BALLOON ONLY  07/02/2011   Procedure: PERCUTANEOUS CORONARY INTERVENTION-BALLOON ONLY;  Surgeon: Sherren Mocha, MD;  Location: [facility] CATH LAB;  Service: Cardiovascular;;   PERCUTANEOUS CORONARY INTERVENTION-BALLOON ONLY  07/05/2012   Procedure: PERCUTANEOUS CORONARY INTERVENTION-BALLOON ONLY;  Surgeon: Sherren Mocha, MD;  Location: [facility] CATH LAB;  Service: Cardiovascular;;  VAGINAL HYSTERECTOMY  ~ 1976   no oophorectomy   Family History  Problem Relation Age of Onset   Hypertension Mother    Stroke Mother    Coronary artery disease Father        CAD in female relative <50   Heart attack Father    Heart attack Brother 69   Heart Problems Sister 23       perminant pacemaker   Suicidality Son 44       2010   Healthy Grandchild    Colon cancer Neg Hx    Breast cancer Neg Hx    Stomach cancer Neg Hx    Rectal cancer Neg Hx    Esophageal cancer Neg Hx    Liver cancer Neg Hx    Social History   Socioeconomic History   Marital status: Widowed    Spouse name: Not on file   Number of children: 1   Years of education: Not on file   Highest education level: Not on file  Occupational History   Occupation:  Retired-2003  Tobacco Use   Smoking status: Former    Packs/day: 0.50    Years: 54.00    Pack years: 27.00    Types: Cigarettes    Quit date: 12/15/2013    Years since quitting: 7.1   Smokeless tobacco: Never   Tobacco comments:    quit tobacco 12-2013 after CABG  Vaping Use   Vaping Use: Never used  Substance and Sexual Activity   Alcohol use: Yes   Drug use: No   Sexual activity: Not Currently    Birth control/protection: Surgical  Other Topics Concern   Not on file  Social History Narrative   G-son his wife/son live w/ her    HC-POA -- sister's daughter    Social Determinants of Health   Financial Resource Strain: Medium Risk   Difficulty of Paying Living Expenses: Somewhat hard  Food Insecurity: No Food Insecurity   Worried About Charity fundraiser in the Last Year: Never true   Ran Out of Food in the Last Year: Never true  Transportation Needs: No Transportation Needs   Lack of Transportation (Medical): No   Lack of Transportation (Non-Medical): No  Physical Activity: Inactive   Days of Exercise per Week: 0 days   Minutes of Exercise per Session: 0 min  Stress: No Stress Concern Present   Feeling of Stress : Not at all  Social Connections: Socially Isolated   Frequency of Communication with Friends and Family: More than three times a week   Frequency of Social Gatherings with Friends and Family: More than three times a week   Attends Religious Services: Never   Marine scientist or Organizations: No   Attends Archivist Meetings: Never   Marital Status: Widowed    Tobacco Counseling Counseling given: Not Answered Tobacco comments: quit tobacco 12-2013 after CABG   Clinical Intake:  Pre-visit preparation completed: Yes  Pain : No/denies pain     Nutritional Status: BMI <19  Underweight Nutritional Risks: None Diabetes: No  How often do you need to have someone help you when you read instructions, pamphlets, or other written materials  from your doctor or pharmacy?: 1 - Never  Diabetic?No  Interpreter Needed?: No  Information entered by :: Caroleen Hamman LPN   Activities of Daily Living In your present state of health, do you have any difficulty performing the following activities: 02/06/2021 07/07/2020  Hearing? N N  Vision? N  N  Difficulty concentrating or making decisions? N N  Walking or climbing stairs? N Y  Dressing or bathing? N N  Doing errands, shopping? N N  Preparing Food and eating ? N -  Using the Toilet? N -  In the past six months, have you accidently leaked urine? N -  Do you have problems with loss of bowel control? N -  Managing your Medications? N -  Managing your Finances? N -  Housekeeping or managing your Housekeeping? N -  Some recent data might be hidden    Patient Care Team: Colon Branch, MD as PCP - General (Internal Medicine) Sherren Mocha, MD as PCP - Cardiology (Cardiology) Rigoberto Noel, MD as Consulting Physician (Pulmonary Disease) Zadie Rhine Clent Demark, MD as Consulting Physician (Ophthalmology) Burtis Junes, NP (Inactive) as Nurse Practitioner (Cardiology) Diannah Shipper, MD as Consulting Physician (Gastroenterology) Leighton Ruff, MD as Consulting Physician (General Surgery) Day, Melvenia Beam, [facility] (Inactive) as Pharmacist (Pharmacist)  Indicate any recent Medical Services you may have received from other than [facility] providers in the past year (date may be approximate).     Assessment:   This is a routine wellness examination for Kamren.  Hearing/Vision screen Hearing Screening - Comments:: No issues Vision Screening - Comments:: Reading glasses Last eye exam-2021  Dietary issues and exercise activities discussed: Current Exercise Habits: The patient does not participate in regular exercise at present, Exercise limited by: None identified   Goals Addressed               This Visit's Progress     Patient Stated     Maintain current health (pt-stated)   On track      Other     Maintain Lifestyle   On track      Depression Screen PHQ 2/9 Scores 02/06/2021 10/15/2020 01/26/2020 01/25/2020 07/06/2019 07/06/2019 09/04/2017  PHQ - 2 Score 0 4 0 2 2 0 0  PHQ- 9 Score - 7 - 3 3 - -    Fall Risk Fall Risk  02/06/2021 10/15/2020 08/29/2020 01/26/2020 01/25/2020  Falls in the past year? 0 0 0 0 0  Comment - - - - -  Number falls in past yr: 0 0 0 0 0  Comment - - - - -  Injury with Fall? 0 0 0 0 0  Follow up Falls prevention discussed Falls evaluation completed - Education provided;Falls prevention discussed Falls evaluation completed    FALL RISK PREVENTION PERTAINING TO THE HOME:  Any stairs in or around the home? No  Home free of loose throw rugs in walkways, pet beds, electrical cords, etc? Yes  Adequate lighting in your home to reduce risk of falls? Yes   ASSISTIVE DEVICES UTILIZED TO PREVENT FALLS:  Life alert? No  Use of a cane, walker or w/c? No  Grab bars in the bathroom? Yes  Shower chair or bench in shower? No  Elevated toilet seat or a handicapped toilet? No   TIMED UP AND GO:  Was the test performed? No . Phone visit   Cognitive Function:Normal cognitive status assessed by this Nurse Health Advisor. No abnormalities found.   MMSE - Mini Mental State Exam 09/04/2017 08/21/2016  Orientation to time 5 5  Orientation to Place 5 5  Registration 3 3  Attention/ Calculation 5 5  Recall 3 3  Language- name 2 objects 2 2  Language- repeat 1 1  Language- follow 3 step command 3 3  Language- read & follow direction  1 1  Write a sentence 1 1  Copy design 1 1  Total score 30 30        Immunizations Immunization History  Administered Date(s) Administered   Fluad Quad(high Dose 65+) 07/06/2019, 05/29/2020   Influenza Split 07/24/2011, 04/30/2012   Influenza Whole 06/14/2007, 06/08/2008, 05/18/2009, 05/09/2010   Influenza, High Dose Seasonal PF 04/11/2013, 05/13/2016, 06/24/2017, 05/03/2018   Influenza,inj,Quad PF,6+ Mos 05/09/2014,  05/28/2015   PFIZER(Purple Top)SARS-COV-2 Vaccination 10/29/2019, 11/26/2019   Pneumococcal Conjugate-13 04/06/2014   Pneumococcal Polysaccharide-23 08/11/2005, 07/18/2010, 10/15/2020   Td 01/25/2007   Tdap 10/07/2016    TDAP status: Up to date  Flu Vaccine status: Up to date  Pneumococcal vaccine status: Up to date  Covid-19 vaccine status: Completed vaccines  Qualifies for Shingles Vaccine? Yes   Zostavax completed No   Shingrix Completed?: Yes  Screening Tests Health Maintenance  Topic Date Due   Zoster Vaccines- Shingrix (1 of 2) Never done   COVID-19 Vaccine (3 - Pfizer risk series) 12/24/2019   INFLUENZA VACCINE  03/11/2021   TETANUS/TDAP  10/07/2026   DEXA SCAN  Completed   PNA vac Low Risk Adult  Completed   HPV VACCINES  Aged Out    Health Maintenance  Health Maintenance Due  Topic Date Due   Zoster Vaccines- Shingrix (1 of 2) Never done   COVID-19 Vaccine (3 - Pfizer risk series) 12/24/2019    Colorectal cancer screening: No longer required.   Mammogram status: Declined  Bone Density status: Ordered today. Pt provided with contact info and advised to call to schedule appt.  Lung Cancer Screening: (Low Dose CT Chest recommended if Age 70-80 years, 30 pack-year currently smoking OR have quit w/in 15years.) does not qualify.     Additional Screening:  Hepatitis C Screening: does not qualify  Vision Screening: Recommended annual ophthalmology exams for early detection of glaucoma and other disorders of the eye. Is the patient up to date with their annual eye exam?  Yes  Who is the provider or what is the name of the office in which the patient attends annual eye exams? Patient unsure of name    Dental Screening: Recommended annual dental exams for proper oral hygiene  Community Resource Referral / Chronic Care Management: CRR required this visit?  No   CCM required this visit?  No      Plan:     I have personally reviewed and noted the  following in the patient's chart:   Medical and social history Use of alcohol, tobacco or illicit drugs  Current medications and supplements including opioid prescriptions.  Functional ability and status Nutritional status Physical activity Advanced directives List of other physicians Hospitalizations, surgeries, and ER visits in previous 12 months Vitals Screenings to include cognitive, depression, and falls Referrals and appointments  In addition, I have reviewed and discussed with patient certain preventive protocols, quality metrics, and best practice recommendations. A written personalized care plan for preventive services as well as general preventive health recommendations were provided to patient.   Due to this being a telephonic visit, the after visit summary with patients personalized plan was offered to patient via mail or my-chart. Patient would like to access on my-chart.   Marta Antu, LPN   04/25/568  Nurse Health Advisor  Nurse Notes: None

## 2021-02-06 NOTE — Patient Instructions (Signed)
Patricia [name] , Thank you for taking time to complete your Medicare Wellness Visit. I appreciate your ongoing commitment to your health goals. Please review the following plan we discussed and let me know if I can assist you in the future.   Screening recommendations/referrals: Colonoscopy: No longer required Mammogram: Due-Declined today. Please call the office to schedule if you change your mind. Bone Density: Ordered today. Someone will call you to schedule. Recommended yearly ophthalmology/optometry visit for glaucoma screening and checkup Recommended yearly dental visit for hygiene and checkup  Vaccinations: Influenza vaccine: Up to date Pneumococcal vaccine: Up to date Tdap vaccine: Up to date-Due-10/07/2026 Shingles vaccine: Discuss with pharmacy   Covid-19:Up to date-Please bring documentation of booster to your next visit.  Advanced directives: Copy in chart  Conditions/risks identified: See problem list  Next appointment: Follow up in one year for your annual wellness visit    Preventive Care 65 Years and Older, Female Preventive care refers to lifestyle choices and visits with your health care provider that can promote health and wellness. What does preventive care include? A yearly physical exam. This is also called an annual well check. Dental exams once or twice a year. Routine eye exams. Ask your health care provider how often you should have your eyes checked. Personal lifestyle choices, including: Daily care of your teeth and gums. Regular physical activity. Eating a healthy diet. Avoiding tobacco and drug use. Limiting alcohol use. Practicing safe sex. Taking low-dose aspirin every day. Taking vitamin and mineral supplements as recommended by your health care provider. What happens during an annual well check? The services and screenings done by your health care provider during your annual well check will depend on your age, overall health, lifestyle risk  factors, and family history of disease. Counseling  Your health care provider may ask you questions about your: Alcohol use. Tobacco use. Drug use. Emotional well-being. Home and relationship well-being. Sexual activity. Eating habits. History of falls. Memory and ability to understand (cognition). Work and work Statistician. Reproductive health. Screening  You may have the following tests or measurements: Height, weight, and BMI. Blood pressure. Lipid and cholesterol levels. These may be checked every 5 years, or more frequently if you are over 14 years old. Skin check. Lung cancer screening. You may have this screening every year starting at age 2 if you have a 30-pack-year history of smoking and currently smoke or have quit within the past 15 years. Fecal occult blood test (FOBT) of the stool. You may have this test every year starting at age 61. Flexible sigmoidoscopy or colonoscopy. You may have a sigmoidoscopy every 5 years or a colonoscopy every 10 years starting at age 27. Hepatitis C blood test. Hepatitis B blood test. Sexually transmitted disease (STD) testing. Diabetes screening. This is done by checking your blood sugar (glucose) after you have not eaten for a while (fasting). You may have this done every 1-3 years. Bone density scan. This is done to screen for osteoporosis. You may have this done starting at age 30. Mammogram. This may be done every 1-2 years. Talk to your health care provider about how often you should have regular mammograms. Talk with your health care provider about your test results, treatment options, and if necessary, the need for more tests. Vaccines  Your health care provider may recommend certain vaccines, such as: Influenza vaccine. This is recommended every year. Tetanus, diphtheria, and acellular pertussis (Tdap, Td) vaccine. You may need a Td booster every 10 years. Zoster vaccine.  You may need this after age 21. Pneumococcal 13-valent  conjugate (PCV13) vaccine. One dose is recommended after age 8. Pneumococcal polysaccharide (PPSV23) vaccine. One dose is recommended after age 45. Talk to your health care provider about which screenings and vaccines you need and how often you need them. This information is not intended to replace advice given to you by your health care provider. Make sure you discuss any questions you have with your health care provider. Document Released: 08/24/2015 Document Revised: 04/16/2016 Document Reviewed: 05/29/2015 Elsevier Interactive Patient Education  2017 [place] Prevention in the Home Falls can cause injuries. They can happen to people of all ages. There are many things you can do to make your home safe and to help prevent falls. What can I do on the outside of my home? Regularly fix the edges of walkways and driveways and fix any cracks. Remove anything that might make you trip as you walk through a door, such as a raised step or threshold. Trim any bushes or trees on the path to your home. Use bright outdoor lighting. Clear any walking paths of anything that might make someone trip, such as rocks or tools. Regularly check to see if handrails are loose or broken. Make sure that both sides of any steps have handrails. Any raised decks and porches should have guardrails on the edges. Have any leaves, snow, or ice cleared regularly. Use sand or salt on walking paths during winter. Clean up any spills in your garage right away. This includes oil or grease spills. What can I do in the bathroom? Use night lights. Install grab bars by the toilet and in the tub and shower. Do not use towel bars as grab bars. Use non-skid mats or decals in the tub or shower. If you need to sit down in the shower, use a plastic, non-slip stool. Keep the floor dry. Clean up any water that spills on the floor as soon as it happens. Remove soap buildup in the tub or shower regularly. Attach bath mats  securely with double-sided non-slip rug tape. Do not have throw rugs and other things on the floor that can make you trip. What can I do in the bedroom? Use night lights. Make sure that you have a light by your bed that is easy to reach. Do not use any sheets or blankets that are too big for your bed. They should not hang down onto the floor. Have a firm chair that has side arms. You can use this for support while you get dressed. Do not have throw rugs and other things on the floor that can make you trip. What can I do in the kitchen? Clean up any spills right away. Avoid walking on wet floors. Keep items that you use a lot in easy-to-reach places. If you need to reach something above you, use a strong step stool that has a grab bar. Keep electrical cords out of the way. Do not use floor polish or wax that makes floors slippery. If you must use wax, use non-skid floor wax. Do not have throw rugs and other things on the floor that can make you trip. What can I do with my stairs? Do not leave any items on the stairs. Make sure that there are handrails on both sides of the stairs and use them. Fix handrails that are broken or loose. Make sure that handrails are as long as the stairways. Check any carpeting to make sure that it is  firmly attached to the stairs. Fix any carpet that is loose or worn. Avoid having throw rugs at the top or bottom of the stairs. If you do have throw rugs, attach them to the floor with carpet tape. Make sure that you have a light switch at the top of the stairs and the bottom of the stairs. If you do not have them, ask someone to add them for you. What else can I do to help prevent falls? Wear shoes that: Do not have high heels. Have rubber bottoms. Are comfortable and fit you well. Are closed at the toe. Do not wear sandals. If you use a stepladder: Make sure that it is fully opened. Do not climb a closed stepladder. Make sure that both sides of the stepladder  are locked into place. Ask someone to hold it for you, if possible. Clearly mark and make sure that you can see: Any grab bars or handrails. First and last steps. Where the edge of each step is. Use tools that help you move around (mobility aids) if they are needed. These include: Canes. Walkers. Scooters. Crutches. Turn on the lights when you go into a dark area. Replace any light bulbs as soon as they burn out. Set up your furniture so you have a clear path. Avoid moving your furniture around. If any of your floors are uneven, fix them. If there are any pets around you, be aware of where they are. Review your medicines with your doctor. Some medicines can make you feel dizzy. This can increase your chance of falling. Ask your doctor what other things that you can do to help prevent falls. This information is not intended to replace advice given to you by your health care provider. Make sure you discuss any questions you have with your health care provider. Document Released: 05/24/2009 Document Revised: 01/03/2016 Document Reviewed: 09/01/2014 Elsevier Interactive Patient Education  2017 [employer].

## 2021-02-11 ENCOUNTER — Other Ambulatory Visit: Payer: Self-pay | Admitting: Oncology

## 2021-02-13 ENCOUNTER — Other Ambulatory Visit (HOSPITAL_COMMUNITY): Payer: Self-pay | Admitting: Internal Medicine

## 2021-02-13 DIAGNOSIS — I6523 Occlusion and stenosis of bilateral carotid arteries: Secondary | ICD-10-CM

## 2021-02-14 ENCOUNTER — Other Ambulatory Visit: Payer: Self-pay

## 2021-02-14 ENCOUNTER — Ambulatory Visit (HOSPITAL_COMMUNITY)
Admission: RE | Admit: 2021-02-14 | Discharge: 2021-02-14 | Disposition: A | Discharge status: Home / Self Care | Payer: Medicare Other | Source: Ambulatory Visit | Attending: Cardiovascular Disease | Admitting: Cardiovascular Disease

## 2021-02-14 DIAGNOSIS — I6523 Occlusion and stenosis of bilateral carotid arteries: Secondary | ICD-10-CM

## 2021-02-15 ENCOUNTER — Encounter: Payer: Self-pay | Admitting: Internal Medicine

## 2021-02-15 ENCOUNTER — Ambulatory Visit (INDEPENDENT_AMBULATORY_CARE_PROVIDER_SITE_OTHER): Payer: Medicare Other | Admitting: Internal Medicine

## 2021-02-15 VITALS — BP 124/62 | HR 100 | Temp 98.1°F | Resp 16 | Ht 62.0 in | Wt 102.1 lb

## 2021-02-15 DIAGNOSIS — F32A Depression, unspecified: Secondary | ICD-10-CM | POA: Diagnosis not present

## 2021-02-15 DIAGNOSIS — I6523 Occlusion and stenosis of bilateral carotid arteries: Secondary | ICD-10-CM | POA: Diagnosis not present

## 2021-02-15 DIAGNOSIS — F419 Anxiety disorder, unspecified: Secondary | ICD-10-CM

## 2021-02-15 DIAGNOSIS — I10 Essential (primary) hypertension: Secondary | ICD-10-CM

## 2021-02-15 NOTE — Progress Notes (Signed)
Subjective:    Patient ID: Patricia [name], female    DOB: 02/18/1941, 80 y.o.   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 I reviewed the chart including several oncology notes, she had a thyroid biopsy, review results.  Review of Systems Denies chest pain or difficulty breathing. Emotionally it has been difficult for her but she feels she is handling okay.  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 Zadie Rhine   COPD (chronic obstructive pulmonary disease) ([place])    HTN (hypertension)    intolerant to ACEi (cough)   Hyperlipidemia    Osteopenia    PAD (peripheral artery disease) ([place])    Carotid dz as  below, and at [facility] 11/12:  critical stenosis noted just below the sheath site leading into a totally occluded SFA and a patent deep femoral artery   Panic attacks    Thyroid nodule     Past Surgical History:  Procedure Laterality Date   BIOPSY  09/07/2019   Procedure: BIOPSY;  Surgeon: Jackquline Denmark, MD;  Location: [facility];  Service: Gastroenterology;;   CORONARY ANGIOPLASTY WITH STENT PLACEMENT     "1; makes total of 3" (07/05/2012)   CORONARY ARTERY BYPASS GRAFT N/A 12/15/2013   Procedure: CORONARY ARTERY BYPASS GRAFTING (CABG);  Surgeon: Gaye Pollack, MD;  Location: [facility];  Service: Open Heart Surgery;  Laterality: N/A;  Times 3 using left internal mammary artery and  endoscopically harvested right saphenous vein    FLEXIBLE SIGMOIDOSCOPY N/A 09/07/2019   Procedure: FLEXIBLE SIGMOIDOSCOPY;  Surgeon: Jackquline Denmark, MD;  Location: [facility];  Service: Gastroenterology;  Laterality: N/A;   INTRAOPERATIVE TRANSESOPHAGEAL ECHOCARDIOGRAM N/A 12/15/2013   Procedure: INTRAOPERATIVE TRANSESOPHAGEAL ECHOCARDIOGRAM;  Surgeon: Gaye Pollack, MD;  Location: [facility] OR;  Service: Open Heart Surgery;  Laterality: N/A;   KIDNEY SURGERY  1960's   "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 C[facility] LAB;  Service: Cardiovascular;  Laterality: N/A;   LEFT HEART CATHETERIZATION WITH CORONARY ANGIOGRAM N/A 07/05/2012   Procedure: LEFT HEART CATHETERIZATION WITH CORONARY ANGIOGRAM;  Surgeon: Sherren Mocha, MD;  Location: [facility] CATH LAB;  Service: Cardiovascular;  Laterality: N/A;   LEFT HEART CATHETERIZATION WITH CORONARY ANGIOGRAM N/A 11/28/2013   Procedure: LEFT HEART CATHETERIZATION WITH CORONARY ANGIOGRAM;  Surgeon: Blane Ohara, MD;  Location: [facility] CATH LAB;  Service: Cardiovascular;  Laterality: N/A;   PERCUTANEOUS CORONARY INTERVENTION-BALLOON ONLY  07/02/2011   Procedure: PERCUTANEOUS CORONARY INTERVENTION-BALLOON ONLY;  Surgeon: Sherren Mocha, MD;  Location: [facility] CATH LAB;  Service: Cardiovascular;;  PERCUTANEOUS CORONARY INTERVENTION-BALLOON ONLY  07/05/2012   Procedure: PERCUTANEOUS CORONARY INTERVENTION-BALLOON ONLY;  Surgeon: Sherren Mocha, MD;  Location: [facility] CATH LAB;  Service: Cardiovascular;;   VAGINAL HYSTERECTOMY  ~ 1976   no oophorectomy    Allergies as of 02/15/2021       Reactions   Ace Inhibitors Other (See Comments)   cough        Medication List        Accurate as of February 15, 2021  3:25 PM. If you have any questions,  ask your nurse or doctor.          amLODipine 10 MG tablet Commonly known as: NORVASC Take 1 tablet (10 mg total) by mouth daily.   aspirin 325 MG tablet Take 325 mg by mouth at bedtime.   atorvastatin 80 MG tablet Commonly known as: LIPITOR Take 1 tablet (80 mg total) by mouth at bedtime.   budesonide-formoterol 160-4.5 MCG/ACT inhaler Commonly known as: SYMBICORT Inhale 2 puffs into the lungs 2 (two) times daily.   clonazePAM 0.5 MG tablet Commonly known as: KLONOPIN 1 po AM and 1.5-2 po q pm prn   FLUoxetine 20 MG capsule Commonly known as: PROZAC Take 2 capsules (40 mg total) by mouth daily.   furosemide 40 MG tablet Commonly known as: LASIX Take 1 tablet (40 mg total) by mouth daily.   hydrochlorothiazide 12.5 MG capsule Commonly known as: MICROZIDE Take 1 capsule (12.5 mg total) by mouth daily.   losartan 100 MG tablet Commonly known as: COZAAR Take 1 tablet by mouth once daily   metoprolol succinate 25 MG 24 hr tablet Commonly known as: TOPROL-XL Take 1 tablet (25 mg total) by mouth at bedtime. PLEASE CONTACT OFFICE FOR AN APPOINTMENT TO GET ADDITIONAL REFILLS 2ND ATTEMPT   potassium chloride 10 MEQ tablet Commonly known as: KLOR-CON Take 1 tablet (10 mEq total) by mouth daily.   Vitamin D3 25 MCG (1000 UT) Caps Take 1,000 Units by mouth at bedtime.           Objective:   Physical Exam BP 124/62 (BP Location: Left Arm, Patient Position: Sitting, Cuff Size: Small)   Pulse 100   Temp 98.1 F (36.7 C) (Oral)   Resp 16   Ht 5\' 2"  (1.575 m)   Wt 102 lb 2 oz (46.3 kg)   SpO2 91%   BMI 18.68 kg/m  General:   Well developed, NAD, BMI noted. HEENT:  Normocephalic . Face symmetric, atraumatic Lungs:  Decreased BS Normal respiratory effort, no intercostal retractions, no accessory muscle use. Heart: RRR,  no murmur.  Lower extremities: no pretibial edema bilaterally  Skin: Not pale. Not jaundice Neurologic:  alert & oriented X3.  Speech  normal, gait appropriate for age and unassisted Psych--  Cognition and judgment appear intact.  Cooperative with normal attention span and concentration.  Behavior appropriate. No anxious or depressed appearing.      Assessment    Assessment   Hyperglycemia, A1c 5.7 HTN Hyperlipidemia Anxiety depression COPD , quit tobacco 2015 DJD CV: --CAD,cath 2012, 2013, CABG 2015 --PAD -   ABIs 2012, 07-2015 : Stable moderate disease left, normal right  --Carotid disease   --Central Retinal vein occlusion 02-2015 --Palpable aorta US abdomen 06-2015 no AA   Increase LFTs, elevated alkaline phosphatase , [place] abd neg 06-2015  Thyroid nodule  --[place] 2012 stable (since 2009), Bx 2022: Hur[place]le  Anal cancer:  DX 08-2019, status post chemotherapy.  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 I discussed this dx w/ her, she knows standard of care is surgery.  She rather focus on treating her other cancer and I understand.  We agreed she will discuss with oncology at the next visit and with me in few months. HTN: Well-controlled, on multiple medications, recent BMP satisfactory Anxiety depression: On clonazepam, Prozac, despite her multiple medical problems she is trying to stay positive and so far she has been successful.  She knows to reach out if help is needed. Preventive care: Had [place] x4, recommend flu shot this fall. RTC 5 to 6 months     This visit occurred during the SARS-CoV-2 public health emergency.  Safety protocols were in place, including screening questions prior to the visit, additional usage of staff PPE, and extensive cleaning of exam room while observing appropriate contact time as indicated for disinfecting solutions.

## 2021-02-15 NOTE — Patient Instructions (Addendum)
   GO TO THE FRONT DESK, PLEASE SCHEDULE YOUR APPOINTMENTS Come back for a check up in 5 to 6 months

## 2021-02-16 ENCOUNTER — Other Ambulatory Visit: Payer: Self-pay | Admitting: Cardiovascular Disease

## 2021-02-16 NOTE — Assessment & Plan Note (Signed)
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 I discussed this dx w/ her, she knows standard of care is surgery.  She rather focus on treating her other cancer and I understand.  We agreed she will discuss with oncology at the next visit and with me in few months. HTN: Well-controlled, on multiple medications, recent BMP satisfactory Anxiety depression: On clonazepam, Prozac, despite her multiple medical problems she is trying to stay positive and so far she has been successful.  She knows to reach out if help is needed. Preventive care: Had [place] x4, recommend flu shot this fall. RTC 5 to 6 months

## 2021-02-18 ENCOUNTER — Encounter: Payer: Self-pay | Admitting: Nurse Practitioner

## 2021-02-18 ENCOUNTER — Inpatient Hospital Stay (HOSPITAL_BASED_OUTPATIENT_CLINIC_OR_DEPARTMENT_OTHER): Payer: Medicare Other | Admitting: Nurse Practitioner

## 2021-02-18 ENCOUNTER — Inpatient Hospital Stay: Payer: Medicare Other | Attending: Oncology

## 2021-02-18 ENCOUNTER — Other Ambulatory Visit: Payer: Self-pay

## 2021-02-18 ENCOUNTER — Inpatient Hospital Stay: Payer: Medicare Other

## 2021-02-18 VITALS — BP 119/54 | HR 73 | Temp 97.7°F | Resp 18 | Ht 62.0 in | Wt 101.0 lb

## 2021-02-18 DIAGNOSIS — R5383 Other fatigue: Secondary | ICD-10-CM | POA: Diagnosis not present

## 2021-02-18 DIAGNOSIS — R110 Nausea: Secondary | ICD-10-CM | POA: Diagnosis not present

## 2021-02-18 DIAGNOSIS — D61810 Antineoplastic chemotherapy induced pancytopenia: Secondary | ICD-10-CM | POA: Insufficient documentation

## 2021-02-18 DIAGNOSIS — R197 Diarrhea, unspecified: Secondary | ICD-10-CM | POA: Insufficient documentation

## 2021-02-18 DIAGNOSIS — Z5112 Encounter for antineoplastic immunotherapy: Secondary | ICD-10-CM | POA: Diagnosis not present

## 2021-02-18 DIAGNOSIS — G893 Neoplasm related pain (acute) (chronic): Secondary | ICD-10-CM | POA: Diagnosis not present

## 2021-02-18 DIAGNOSIS — F418 Other specified anxiety disorders: Secondary | ICD-10-CM | POA: Insufficient documentation

## 2021-02-18 DIAGNOSIS — C210 Malignant neoplasm of anus, unspecified: Secondary | ICD-10-CM | POA: Insufficient documentation

## 2021-02-18 DIAGNOSIS — I2510 Atherosclerotic heart disease of native coronary artery without angina pectoris: Secondary | ICD-10-CM | POA: Diagnosis not present

## 2021-02-18 DIAGNOSIS — E785 Hyperlipidemia, unspecified: Secondary | ICD-10-CM | POA: Insufficient documentation

## 2021-02-18 DIAGNOSIS — I10 Essential (primary) hypertension: Secondary | ICD-10-CM | POA: Insufficient documentation

## 2021-02-18 DIAGNOSIS — J449 Chronic obstructive pulmonary disease, unspecified: Secondary | ICD-10-CM | POA: Insufficient documentation

## 2021-02-18 LAB — CMP (CANCER CENTER ONLY)
ALT: 11 U/L (ref 0–44)
AST: 15 U/L (ref 15–41)
Albumin: 3.6 g/dL (ref 3.5–5.0)
Alkaline Phosphatase: 121 U/L (ref 38–126)
Anion gap: 9 (ref 5–15)
BUN: 25 mg/dL — ABNORMAL HIGH (ref 8–23)
CO2: 29 mmol/L (ref 22–32)
Calcium: 9.4 mg/dL (ref 8.9–10.3)
Chloride: 100 mmol/L (ref 98–111)
Creatinine: 0.74 mg/dL (ref 0.44–1.00)
GFR, Estimated: >60 mL/min (ref >60)
Glucose, Bld: 97 mg/dL (ref 70–99)
Potassium: 3.6 mmol/L (ref 3.5–5.1)
Sodium: 138 mmol/L (ref 135–145)
Total Bilirubin: 0.4 mg/dL (ref 0.3–1.2)
Total Protein: 6.4 g/dL — ABNORMAL LOW (ref 6.5–8.1)

## 2021-02-18 MED ORDER — SODIUM CHLORIDE 0.9 % IV SOLN
Freq: Once | INTRAVENOUS | Status: AC
  Filled 2021-02-18: qty 250

## 2021-02-18 MED ORDER — SODIUM CHLORIDE 0.9 % IV SOLN
240.0000 mg | Freq: Once | INTRAVENOUS | Status: AC
  Administered 2021-02-18: 240 mg via INTRAVENOUS
  Filled 2021-02-18: qty 24

## 2021-02-18 NOTE — Progress Notes (Signed)
  [facility] OFFICE PROGRESS NOTE   Diagnosis: Anal cancer  INTERVAL HISTORY:   Patricia [name] returns as scheduled.  She completed cycle 1 nivolumab 02/04/2021.  No diarrhea or rash.  Bowels moving daily.  No significant pain or bleeding.  No nausea or vomiting.  Objective:  Vital signs in last 24 hours:  Blood pressure (!) 119/54, pulse 73, temperature 97.7 F (36.5 C), temperature source Oral, resp. rate 18, height 5\' 2"  (1.575 m), weight 101 lb (45.8 kg), SpO2 94 %.    Resp: Lungs clear bilaterally. Cardio: Regular rate and rhythm. GI: Abdomen soft and nontender.  No hepatomegaly. Vascular: No leg edema. Skin: No rash.   Lab Results:  Lab Results  Component Value Date   WBC 7.6 02/04/2021   HGB 13.1 02/04/2021   HCT 40.5 02/04/2021   MCV 92.9 02/04/2021   PLT 254 02/04/2021   NEUTROABS 5.2 02/04/2021    Imaging:  No results found.  Medications: I have reviewed the patient's current medications.  Assessment/Plan: Anal cancer 09/06/2019 CT abdomen/pelvis-complex 5.1 cm x 3.2 cm x 4.1 cm posterior left perirectal fluid collection/abscess.   09/07/2019 flexible sigmoidoscopy by Dr. Sim Boast cm x 5 cm hard fungating anal mass located predominantly at the left anterior-lateral area and extending to the dentate line with minor anal stenosis.  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 PET scan 09/22/2019-large intensely hypermetabolic exophytic mass extending posteriorly from the anus consistent with primary anal carcinoma.  Local regional hypermetabolic nodal metastasis to a left inguinal lymph node.  No iliac nodal metastasis or para-aortic nodal metastasis.  No liver or other visceral metastasis. Intensely hypermetabolic rounded right supraclavicular mass. Cycle 1 day 1 dose-reduced 5FU/mitomycin and concurrent RT 09/26/19  Cycle two 5-FU/mitomycin-C 10/24/2019, radiation completed 11/11/2019 10/16/2020 -2 cm firm raised lesion at  the 6 o'clock position, anterior anal verge Biopsy of anterior nodule by Dr. Marcello Moores 10/16/2020-squamous cell carcinoma PET scan 11/21/2020- interval decrease in size and decreased metabolic activity in the anal mass.  No findings suspicious for metastatic disease.  2.7 cm hypermetabolic left thyroid nodule slightly more hypermetabolic. Thyroid ultrasound 01/11/2021-right mid thyroid nodule measuring 3.4 cm, FNA recommended. 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 Nausea and diarrhea, secondary to chemoRT starting day 5, imodium partially effective. Started lomotil 10/05/19.   10. Mild pancytopenia secondary to chemo, on day 10 of chemoRT WBC 3.7, Hgb 11.4, PLT 142  11.  Hypermetabolic mass right neck on PET scan 09/22/2019.  She declined further evaluation citing a long history of thyroid nodules.  Thyroid ultrasound 01/11/2021-right mid thyroid nodule measuring 3.4 cm, FNA recommended.  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  Disposition: Patricia [name] appears unchanged.  She tolerated the first cycle of nivolumab well.  Plan to proceed with cycle 2 today as scheduled.  Chemistry panel reviewed.  Labs adequate to proceed as above.  She will return for lab, follow-up, cycle 3 nivolumab in 2 weeks.    Ned Card ANP/GNP-BC   02/18/2021  11:24 AM

## 2021-02-19 ENCOUNTER — Encounter: Payer: Self-pay | Admitting: Physician Assistant

## 2021-02-19 ENCOUNTER — Encounter (HOSPITAL_COMMUNITY): Payer: Self-pay

## 2021-02-20 ENCOUNTER — Other Ambulatory Visit: Payer: Self-pay | Admitting: *Deleted

## 2021-02-20 DIAGNOSIS — I6523 Occlusion and stenosis of bilateral carotid arteries: Secondary | ICD-10-CM

## 2021-03-02 ENCOUNTER — Other Ambulatory Visit: Payer: Self-pay | Admitting: Oncology

## 2021-03-07 ENCOUNTER — Inpatient Hospital Stay (HOSPITAL_BASED_OUTPATIENT_CLINIC_OR_DEPARTMENT_OTHER): Payer: Medicare Other | Admitting: Nurse Practitioner

## 2021-03-07 ENCOUNTER — Encounter: Payer: Self-pay | Admitting: Nurse Practitioner

## 2021-03-07 ENCOUNTER — Inpatient Hospital Stay: Payer: Medicare Other

## 2021-03-07 ENCOUNTER — Other Ambulatory Visit: Payer: Self-pay

## 2021-03-07 VITALS — BP 126/52 | HR 93 | Temp 98.2°F | Resp 19 | Ht 62.0 in | Wt 100.6 lb

## 2021-03-07 DIAGNOSIS — I10 Essential (primary) hypertension: Secondary | ICD-10-CM | POA: Diagnosis not present

## 2021-03-07 DIAGNOSIS — R5383 Other fatigue: Secondary | ICD-10-CM

## 2021-03-07 DIAGNOSIS — C210 Malignant neoplasm of anus, unspecified: Secondary | ICD-10-CM | POA: Diagnosis not present

## 2021-03-07 DIAGNOSIS — I2510 Atherosclerotic heart disease of native coronary artery without angina pectoris: Secondary | ICD-10-CM | POA: Diagnosis not present

## 2021-03-07 DIAGNOSIS — D61810 Antineoplastic chemotherapy induced pancytopenia: Secondary | ICD-10-CM | POA: Diagnosis not present

## 2021-03-07 DIAGNOSIS — G893 Neoplasm related pain (acute) (chronic): Secondary | ICD-10-CM | POA: Diagnosis not present

## 2021-03-07 DIAGNOSIS — Z5112 Encounter for antineoplastic immunotherapy: Secondary | ICD-10-CM | POA: Diagnosis not present

## 2021-03-07 LAB — CMP (CANCER CENTER ONLY)
ALT: 13 U/L (ref 0–44)
AST: 18 U/L (ref 15–41)
Albumin: 3.8 g/dL (ref 3.5–5.0)
Alkaline Phosphatase: 144 U/L — ABNORMAL HIGH (ref 38–126)
Anion gap: 10 (ref 5–15)
BUN: 28 mg/dL — ABNORMAL HIGH (ref 8–23)
CO2: 27 mmol/L (ref 22–32)
Calcium: 9.7 mg/dL (ref 8.9–10.3)
Chloride: 103 mmol/L (ref 98–111)
Creatinine: 0.65 mg/dL (ref 0.44–1.00)
GFR, Estimated: >60 mL/min (ref >60)
Glucose, Bld: 101 mg/dL — ABNORMAL HIGH (ref 70–99)
Potassium: 3.5 mmol/L (ref 3.5–5.1)
Sodium: 140 mmol/L (ref 135–145)
Total Bilirubin: 0.4 mg/dL (ref 0.3–1.2)
Total Protein: 6.6 g/dL (ref 6.5–8.1)

## 2021-03-07 LAB — CBC WITH DIFFERENTIAL (CANCER CENTER ONLY)
Abs Immature Granulocytes: 0.02 10*3/uL (ref 0.00–0.07)
Basophils Absolute: 0.1 10*3/uL (ref 0.0–0.1)
Basophils Relative: 1 %
Eosinophils Absolute: 0 10*3/uL (ref 0.0–0.5)
Eosinophils Relative: 1 %
HCT: 39.6 % (ref 36.0–46.0)
Hemoglobin: 12.8 g/dL (ref 12.0–15.0)
Immature Granulocytes: 0 %
Lymphocytes Relative: 22 %
Lymphs Abs: 1.5 10*3/uL (ref 0.7–4.0)
MCH: 30.3 pg (ref 26.0–34.0)
MCHC: 32.3 g/dL (ref 30.0–36.0)
MCV: 93.8 fL (ref 80.0–100.0)
Monocytes Absolute: 0.6 10*3/uL (ref 0.1–1.0)
Monocytes Relative: 8 %
Neutro Abs: 4.8 10*3/uL (ref 1.7–7.7)
Neutrophils Relative %: 68 %
Platelet Count: 249 10*3/uL (ref 150–400)
RBC: 4.22 MIL/uL (ref 3.87–5.11)
RDW: 13.4 % (ref 11.5–15.5)
WBC Count: 7 10*3/uL (ref 4.0–10.5)
nRBC: 0 % (ref 0.0–0.2)

## 2021-03-07 LAB — TSH: TSH: 1.155 u[IU]/mL (ref 0.350–4.500)

## 2021-03-07 MED ORDER — SODIUM CHLORIDE 0.9 % IV SOLN
240.0000 mg | Freq: Once | INTRAVENOUS | Status: AC
  Administered 2021-03-07: 240 mg via INTRAVENOUS
  Filled 2021-03-07: qty 24

## 2021-03-07 MED ORDER — HYDROCODONE-ACETAMINOPHEN 5-325 MG PO TABS: 1.0000 | ORAL_TABLET | Freq: Four times a day (QID) | ORAL | 0 refills | Status: DC | PRN

## 2021-03-07 MED ORDER — SODIUM CHLORIDE 0.9 % IV SOLN
Freq: Once | INTRAVENOUS | Status: AC
  Filled 2021-03-07: qty 250

## 2021-03-07 NOTE — Progress Notes (Signed)
[place] [facility] OFFICE PROGRESS NOTE   Diagnosis: Anal cancer  INTERVAL HISTORY:   Patricia [name] returns as scheduled.  She completed cycle 2 nivolumab 02/18/2021.  She denies nausea/vomiting.  No rash.  No diarrhea.  Since treatment 2 weeks ago she has been experiencing pain at the low back extending to the upper legs.  Objective:  Vital signs in last 24 hours:  Blood pressure (!) 126/52, pulse 93, temperature 98.2 F (36.8 C), temperature source Oral, resp. rate 19, height '5\' 2"'$  (1.575 m), weight 100 lb 9.6 oz (45.6 kg), SpO2 96 %.    HEENT: No thrush or ulcers. Resp: Lungs clear bilaterally. Cardio: Regular rate and rhythm. GI: No hepatomegaly.  She declines a digital rectal exam.  There is firm nodularity at the right anal verge. Vascular: No leg edema. Neuro: Lower extremity strength intact. Skin: No rash.   Lab Results:  Lab Results  Component Value Date   WBC 7.0 03/07/2021   HGB 12.8 03/07/2021   HCT 39.6 03/07/2021   MCV 93.8 03/07/2021   PLT 249 03/07/2021   NEUTROABS 4.8 03/07/2021    Imaging:  No results found.  Medications: I have reviewed the patient's current medications.  Assessment/Plan: Anal cancer 09/06/2019 CT abdomen/pelvis-complex 5.1 cm x 3.2 cm x 4.1 cm posterior left perirectal fluid collection/abscess.   09/07/2019 flexible sigmoidoscopy by Dr. Sim Boast cm x 5 cm hard fungating anal mass located predominantly at the left anterior-lateral area and extending to the dentate line with minor anal stenosis.  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 PET scan 09/22/2019-large intensely hypermetabolic exophytic mass extending posteriorly from the anus consistent with primary anal carcinoma.  Local regional hypermetabolic nodal metastasis to a left inguinal lymph node.  No iliac nodal metastasis or para-aortic nodal metastasis.  No liver or other visceral metastasis. Intensely hypermetabolic rounded right  supraclavicular mass. Cycle 1 day 1 dose-reduced 5FU/mitomycin and concurrent RT 09/26/19  Cycle two 5-FU/mitomycin-C 10/24/2019, radiation completed 11/11/2019 10/16/2020 -2 cm firm raised lesion at the 6 o'clock position, anterior anal verge Biopsy of anterior nodule by Dr. Marcello Moores 10/16/2020-squamous cell carcinoma PET scan 11/21/2020- interval decrease in size and decreased metabolic activity in the anal mass.  No findings suspicious for metastatic disease.  2.7 cm hypermetabolic left thyroid nodule slightly more hypermetabolic. Thyroid ultrasound 01/11/2021-right mid thyroid nodule measuring 3.4 cm, FNA recommended. 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 Nausea and diarrhea, secondary to chemoRT starting day 5, imodium partially effective. Started lomotil 10/05/19.   10. Mild pancytopenia secondary to chemo, on day 10 of chemoRT WBC 3.7, Hgb 11.4, PLT 142  11.  Hypermetabolic mass right neck on PET scan 09/22/2019.  She declined further evaluation citing a long history of thyroid nodules.  Thyroid ultrasound 01/11/2021-right mid thyroid nodule measuring 3.4 cm, FNA recommended.  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  Disposition: Ms. Lafosse has completed 2 cycles of nivolumab.  Plan to proceed with cycle 3 today as scheduled.  Restaging pelvic CT after 4 cycles.  She is having increased pain at the low back and extending to the upper legs.  We discussed the pain may be due to anal cancer, question sacral involvement.  She  does not desire further evaluation at present.  Prescription sent to her pharmacy for Norco 1 to 2 tablets  every 6 hours as needed.  She understands she should not drive while taking pain medication.  She will return for lab and follow-up, cycle 4 nivolumab in 2 weeks.  We are available to see her sooner if needed.  Plan reviewed with Dr. Benay Spice.  Ned Card ANP/GNP-BC   03/07/2021  1:46 PM

## 2021-03-07 NOTE — Patient Instructions (Signed)
Patricia [name]  Discharge Instructions: Thank you for choosing [facility] to provide your oncology and hematology care.   If you have a lab appointment with the [facility], please go directly to the [facility] and check in at the registration area.   Wear comfortable clothing and clothing appropriate for easy access to any Portacath or PICC line.   We strive to give you quality time with your provider. You may need to reschedule your appointment if you arrive late (15 or more minutes).  Arriving late affects you and other patients whose appointments are after yours.  Also, if you miss three or more appointments without notifying the office, you may be dismissed from the clinic at the provider's discretion.      For prescription refill requests, have your pharmacy contact our office and allow 72 hours for refills to be completed.    Today you received the following chemotherapy and/or immunotherapy agents Opdivo      To help prevent nausea and vomiting after your treatment, we encourage you to take your nausea medication as directed.  BELOW ARE SYMPTOMS THAT SHOULD BE REPORTED IMMEDIATELY: *FEVER GREATER THAN 100.4 F (38 C) OR HIGHER *CHILLS OR SWEATING *NAUSEA AND VOMITING THAT IS NOT CONTROLLED WITH YOUR NAUSEA MEDICATION *UNUSUAL SHORTNESS OF BREATH *UNUSUAL BRUISING OR BLEEDING *URINARY PROBLEMS (pain or burning when urinating, or frequent urination) *BOWEL PROBLEMS (unusual diarrhea, constipation, pain near the anus) TENDERNESS IN MOUTH AND THROAT WITH OR WITHOUT PRESENCE OF ULCERS (sore throat, sores in mouth, or a toothache) UNUSUAL RASH, SWELLING OR PAIN  UNUSUAL VAGINAL DISCHARGE OR ITCHING   Items with * indicate a potential emergency and should be followed up as soon as possible or go to the Emergency Department if any problems should occur.  Please show the CHEMOTHERAPY ALERT CARD or IMMUNOTHERAPY ALERT CARD at check-in to the  Emergency Department and triage nurse.  Should you have questions after your visit or need to cancel or reschedule your appointment, please contact [facility]  Dept: [phone]  and follow the prompts.  Office hours are 8:00 a.m. to 4:30 p.m. Monday - Friday. Please note that voicemails left after 4:00 p.m. may not be returned until the following business day.  We are closed weekends and major holidays. You have access to a nurse at all times for urgent questions. Please call the main number to the clinic Dept: [phone] and follow the prompts.   For any non-urgent questions, you may also contact your provider using MyChart. We now offer e-Visits for anyone 1 and older to request care online for non-urgent symptoms. For details visit [email or web address].   Also download the MyChart app! Go to the app store, search "MyChart", open the app, select [facility], and log in with your MyChart username and password.  Due to Covid, a mask is required upon entering the hospital/clinic. If you do not have a mask, one will be given to you upon arrival. For doctor visits, patients may have 1 support person aged 11 or older with them. For treatment visits, patients cannot have anyone with them due to current Covid guidelines and our immunocompromised population.

## 2021-03-11 ENCOUNTER — Other Ambulatory Visit: Payer: Self-pay | Admitting: Cardiovascular Disease

## 2021-03-14 ENCOUNTER — Other Ambulatory Visit: Payer: Self-pay | Admitting: Nurse Practitioner

## 2021-03-14 ENCOUNTER — Other Ambulatory Visit: Payer: Self-pay | Admitting: Oncology

## 2021-03-14 DIAGNOSIS — C210 Malignant neoplasm of anus, unspecified: Secondary | ICD-10-CM

## 2021-03-14 MED ORDER — HYDROCODONE-ACETAMINOPHEN 5-325 MG PO TABS: 1.0000 | ORAL_TABLET | Freq: Four times a day (QID) | ORAL | 0 refills | Status: DC | PRN

## 2021-03-15 ENCOUNTER — Other Ambulatory Visit: Payer: Self-pay | Admitting: Nurse Practitioner

## 2021-03-15 DIAGNOSIS — C210 Malignant neoplasm of anus, unspecified: Secondary | ICD-10-CM

## 2021-03-15 MED ORDER — HYDROCODONE-ACETAMINOPHEN 5-325 MG PO TABS: 1.0000 | ORAL_TABLET | Freq: Three times a day (TID) | ORAL | 0 refills | Status: DC | PRN

## 2021-03-21 ENCOUNTER — Inpatient Hospital Stay: Payer: Medicare Other

## 2021-03-21 ENCOUNTER — Other Ambulatory Visit: Payer: Self-pay

## 2021-03-21 ENCOUNTER — Inpatient Hospital Stay: Payer: Medicare Other | Attending: Oncology

## 2021-03-21 ENCOUNTER — Inpatient Hospital Stay (HOSPITAL_BASED_OUTPATIENT_CLINIC_OR_DEPARTMENT_OTHER): Payer: Medicare Other | Admitting: Oncology

## 2021-03-21 VITALS — BP 139/57 | HR 87 | Temp 97.8°F | Resp 18 | Ht 62.0 in | Wt 100.2 lb

## 2021-03-21 DIAGNOSIS — I10 Essential (primary) hypertension: Secondary | ICD-10-CM | POA: Diagnosis not present

## 2021-03-21 DIAGNOSIS — C210 Malignant neoplasm of anus, unspecified: Secondary | ICD-10-CM

## 2021-03-21 DIAGNOSIS — I2510 Atherosclerotic heart disease of native coronary artery without angina pectoris: Secondary | ICD-10-CM | POA: Diagnosis not present

## 2021-03-21 DIAGNOSIS — K625 Hemorrhage of anus and rectum: Secondary | ICD-10-CM | POA: Insufficient documentation

## 2021-03-21 DIAGNOSIS — Z5112 Encounter for antineoplastic immunotherapy: Secondary | ICD-10-CM | POA: Insufficient documentation

## 2021-03-21 DIAGNOSIS — J449 Chronic obstructive pulmonary disease, unspecified: Secondary | ICD-10-CM | POA: Insufficient documentation

## 2021-03-21 DIAGNOSIS — E785 Hyperlipidemia, unspecified: Secondary | ICD-10-CM | POA: Insufficient documentation

## 2021-03-21 DIAGNOSIS — E041 Nontoxic single thyroid nodule: Secondary | ICD-10-CM | POA: Insufficient documentation

## 2021-03-21 DIAGNOSIS — D61810 Antineoplastic chemotherapy induced pancytopenia: Secondary | ICD-10-CM | POA: Diagnosis not present

## 2021-03-21 DIAGNOSIS — C20 Malignant neoplasm of rectum: Secondary | ICD-10-CM | POA: Diagnosis not present

## 2021-03-21 LAB — CMP (CANCER CENTER ONLY)
ALT: 15 U/L (ref 0–44)
AST: 23 U/L (ref 15–41)
Albumin: 3.7 g/dL (ref 3.5–5.0)
Alkaline Phosphatase: 153 U/L — ABNORMAL HIGH (ref 38–126)
Anion gap: 11 (ref 5–15)
BUN: 9 mg/dL (ref 8–23)
CO2: 28 mmol/L (ref 22–32)
Calcium: 10 mg/dL (ref 8.9–10.3)
Chloride: 102 mmol/L (ref 98–111)
Creatinine: 0.61 mg/dL (ref 0.44–1.00)
GFR, Estimated: >60 mL/min (ref >60)
Glucose, Bld: 110 mg/dL — ABNORMAL HIGH (ref 70–99)
Potassium: 3.3 mmol/L — ABNORMAL LOW (ref 3.5–5.1)
Sodium: 141 mmol/L (ref 135–145)
Total Bilirubin: 0.5 mg/dL (ref 0.3–1.2)
Total Protein: 6.6 g/dL (ref 6.5–8.1)

## 2021-03-21 MED ORDER — SODIUM CHLORIDE 0.9 % IV SOLN
Freq: Once | INTRAVENOUS | Status: AC
  Filled 2021-03-21: qty 250

## 2021-03-21 MED ORDER — SODIUM CHLORIDE 0.9 % IV SOLN
240.0000 mg | Freq: Once | INTRAVENOUS | Status: AC
  Administered 2021-03-21: 240 mg via INTRAVENOUS
  Filled 2021-03-21: qty 24

## 2021-03-21 NOTE — Progress Notes (Signed)
[facility] OFFICE PROGRESS NOTE   Diagnosis: Anal cancer  INTERVAL HISTORY:   Patricia [name] [name] completed another cycle of nivolumab on 03/07/2021.  No rash or diarrhea.  She reports mucus discharge from the rectum.  She continues to have discomfort at the anus.  She takes hydrocodone intermittently.  The pain has increased since beginning nivolumab. Objective:  Vital signs in last 24 hours:  Blood pressure (!) 139/57, pulse 87, temperature 97.8 F (36.6 C), temperature source Oral, resp. rate 18, height '5\' 2"'$  (1.575 m), weight 100 lb 3.2 oz (45.5 kg), SpO2 100 %.    Lymphatics: No inguinal nodes Resp: Distant breath sounds, no respiratory distress Cardio: Regular rate and rhythm GI: No mass, nontender, no hepatosplenomegaly Vascular: No leg edema   Lab Results:  Lab Results  Component Value Date   WBC 7.0 03/07/2021   HGB 12.8 03/07/2021   HCT 39.6 03/07/2021   MCV 93.8 03/07/2021   PLT 249 03/07/2021   NEUTROABS 4.8 03/07/2021    CMP  Lab Results  Component Value Date   NA 140 03/07/2021   K 3.5 03/07/2021   CL 103 03/07/2021   CO2 27 03/07/2021   GLUCOSE 101 (H) 03/07/2021   BUN 28 (H) 03/07/2021   CREATININE 0.65 03/07/2021   CALCIUM 9.7 03/07/2021   PROT 6.6 03/07/2021   ALBUMIN 3.8 03/07/2021   AST 18 03/07/2021   ALT 13 03/07/2021   ALKPHOS 144 (H) 03/07/2021   BILITOT 0.4 03/07/2021   GFRNONAA >60 03/07/2021   GFRAA >60 01/16/2020     Medications: I have reviewed the patient's current medications.   Assessment/Plan: Anal cancer 09/06/2019 CT abdomen/pelvis-complex 5.1 cm x 3.2 cm x 4.1 cm posterior left perirectal fluid collection/abscess.   09/07/2019 flexible sigmoidoscopy by Dr. Sim Boast cm x 5 cm hard fungating anal mass located predominantly at the left anterior-lateral area and extending to the dentate line with minor anal stenosis.  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 PET scan  09/22/2019-large intensely hypermetabolic exophytic mass extending posteriorly from the anus consistent with primary anal carcinoma.  Local regional hypermetabolic nodal metastasis to a left inguinal lymph node.  No iliac nodal metastasis or para-aortic nodal metastasis.  No liver or other visceral metastasis. Intensely hypermetabolic rounded right supraclavicular mass. Cycle 1 day 1 dose-reduced 5FU/mitomycin and concurrent RT 09/26/19  Cycle two 5-FU/mitomycin-C 10/24/2019, radiation completed 11/11/2019 10/16/2020 -2 cm firm raised lesion at the 6 o'clock position, anterior anal verge Biopsy of anterior nodule by Dr. Marcello Moores 10/16/2020-squamous cell carcinoma PET scan 11/21/2020- interval decrease in size and decreased metabolic activity in the anal mass.  No findings suspicious for metastatic disease.  2.7 cm hypermetabolic left thyroid nodule slightly more hypermetabolic. Thyroid ultrasound 01/11/2021-right mid thyroid nodule measuring 3.4 cm, FNA recommended. 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 Nausea and diarrhea, secondary to chemoRT starting day 5, imodium partially effective. Started lomotil 10/05/19.   10. Mild pancytopenia secondary to chemo, on day 10 of chemoRT WBC 3.7, Hgb 11.4, PLT 142  11.  Hypermetabolic mass right neck on PET scan 09/22/2019.  She declined further evaluation citing a long history of thyroid nodules.  Thyroid ultrasound 01/11/2021-right mid thyroid nodule measuring  3.4 cm, FNA recommended.  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    Disposition: Patricia [name] [name] has completed  3 cycles of nivolumab.  She has tolerated the treatment well.  She continues to have significant anal pain.  She declined anal examination today.  She will complete another cycle of nivolumab today.  She will undergo a restaging CT evaluation after this cycle.  The plan is to consider chemotherapy if the CT confirms disease progression.  She will return for an office visit in 2 weeks.  Betsy Coder, MD  03/21/2021  11:37 AM

## 2021-03-21 NOTE — Progress Notes (Signed)
Per Lattie Haw, NP: Faythe Ghee to treat with CBC form 03/07/2021

## 2021-03-21 NOTE — Patient Instructions (Signed)
[facility]   Discharge Instructions: Thank you for choosing [facility] to provide your oncology and hematology care.   If you have a lab appointment with the [facility], please go directly to the [facility] and check in at the registration area.   Wear comfortable clothing and clothing appropriate for easy access to any Portacath or PICC line.   We strive to give you quality time with your provider. You may need to reschedule your appointment if you arrive late (15 or more minutes).  Arriving late affects you and other patients whose appointments are after yours.  Also, if you miss three or more appointments without notifying the office, you may be dismissed from the clinic at the provider's discretion.      For prescription refill requests, have your pharmacy contact our office and allow 72 hours for refills to be completed.    Today you received the following chemotherapy and/or immunotherapy agents Nivolumab (OPDIVO).      To help prevent nausea and vomiting after your treatment, we encourage you to take your nausea medication as directed.  BELOW ARE SYMPTOMS THAT SHOULD BE REPORTED IMMEDIATELY: *FEVER GREATER THAN 100.4 F (38 C) OR HIGHER *CHILLS OR SWEATING *NAUSEA AND VOMITING THAT IS NOT CONTROLLED WITH YOUR NAUSEA MEDICATION *UNUSUAL SHORTNESS OF BREATH *UNUSUAL BRUISING OR BLEEDING *URINARY PROBLEMS (pain or burning when urinating, or frequent urination) *BOWEL PROBLEMS (unusual diarrhea, constipation, pain near the anus) TENDERNESS IN MOUTH AND THROAT WITH OR WITHOUT PRESENCE OF ULCERS (sore throat, sores in mouth, or a toothache) UNUSUAL RASH, SWELLING OR PAIN  UNUSUAL VAGINAL DISCHARGE OR ITCHING   Items with * indicate a potential emergency and should be followed up as soon as possible or go to the Emergency Department if any problems should occur.  Please show the CHEMOTHERAPY ALERT CARD or IMMUNOTHERAPY ALERT CARD at  check-in to the Emergency Department and triage nurse.  Should you have questions after your visit or need to cancel or reschedule your appointment, please contact [facility]  Dept: [phone]  and follow the prompts.  Office hours are 8:00 a.m. to 4:30 p.m. Monday - Friday. Please note that voicemails left after 4:00 p.m. may not be returned until the following business day.  We are closed weekends and major holidays. You have access to a nurse at all times for urgent questions. Please call the main number to the clinic Dept: [phone] and follow the prompts.   For any non-urgent questions, you may also contact your provider using MyChart. We now offer e-Visits for anyone 15 and older to request care online for non-urgent symptoms. For details visit [email or web address].   Also download the MyChart app! Go to the app store, search "MyChart", open the app, select [facility], and log in with your MyChart username and password.  Due to Covid, a mask is required upon entering the hospital/clinic. If you do not have a mask, one will be given to you upon arrival. For doctor visits, patients may have 1 support person aged 107 or older with them. For treatment visits, patients cannot have anyone with them due to current Covid guidelines and our immunocompromised population.   Nivolumab injection What is this medication? NIVOLUMAB (nye VOL ue mab) is a monoclonal antibody. It treats certain types of cancer. Some of the cancers treated are colon cancer, head and neck cancer,Hodgkin lymphoma, lung cancer, and melanoma. This medicine may be used for other purposes; ask  your health care provider orpharmacist if you have questions. COMMON BRAND NAME(S): Opdivo What should I tell my care team before I take this medication? They need to know if you have any of these conditions: autoimmune diseases like Crohn's disease, ulcerative colitis, or lupus have had or planning to have  an allogeneic stem cell transplant (uses someone else's stem cells) history of chest radiation history of organ transplant nervous system problems like myasthenia gravis or Guillain-Barre syndrome an unusual or allergic reaction to nivolumab, other medicines, foods, dyes, or preservatives pregnant or trying to get pregnant breast-feeding How should I use this medication? This medicine is for infusion into a vein. It is given by a health careprofessional in a hospital or clinic setting. A special MedGuide will be given to you before each treatment. Be sure to readthis information carefully each time. Talk to your pediatrician regarding the use of this medicine in children. While this drug may be prescribed for children as young as 12 years for selectedconditions, precautions do apply. Overdosage: If you think you have taken too much of this medicine contact apoison control center or emergency room at once. NOTE: This medicine is only for you. Do not share this medicine with others. What if I miss a dose? It is important not to miss your dose. Call your doctor or health careprofessional if you are unable to keep an appointment. What may interact with this medication? Interactions have not been studied. This list may not describe all possible interactions. Give your health care provider a list of all the medicines, herbs, non-prescription drugs, or dietary supplements you use. Also tell them if you smoke, drink alcohol, or use illegaldrugs. Some items may interact with your medicine. What should I watch for while using this medication? This drug may make you feel generally unwell. Continue your course of treatmenteven though you feel ill unless your doctor tells you to stop. You may need blood work done while you are taking this medicine. Do not become pregnant while taking this medicine or for 5 months after stopping it. Women should inform their doctor if they wish to become pregnant or think  they might be pregnant. There is a potential for serious side effects to an unborn child. Talk to your health care professional or pharmacist for more information. Do not breast-feed an infant while taking this medicine orfor 5 months after stopping it. What side effects may I notice from receiving this medication? Side effects that you should report to your doctor or health care professionalas soon as possible: allergic reactions like skin rash, itching or hives, swelling of the face, lips, or tongue breathing problems blood in the urine bloody or watery diarrhea or black, tarry stools changes in emotions or moods changes in vision chest pain cough dizziness feeling faint or lightheaded, falls fever, chills headache with fever, neck stiffness, confusion, loss of memory, sensitivity to light, hallucination, loss of contact with reality, or seizures joint pain mouth sores redness, blistering, peeling or loosening of the skin, including inside the mouth severe muscle pain or weakness signs and symptoms of high blood sugar such as dizziness; dry mouth; dry skin; fruity breath; nausea; stomach pain; increased hunger or thirst; increased urination signs and symptoms of kidney injury like trouble passing urine or change in the amount of urine signs and symptoms of liver injury like dark yellow or brown urine; general ill feeling or flu-like symptoms; light-colored stools; loss of appetite; nausea; right upper belly pain; unusually weak or tired; yellowing  of the eyes or skin swelling of the ankles, feet, hands trouble passing urine or change in the amount of urine unusually weak or tired weight gain or loss Side effects that usually do not require medical attention (report to yourdoctor or health care professional if they continue or are bothersome): bone pain constipation decreased appetite diarrhea muscle pain nausea, vomiting tiredness This list may not describe all possible side  effects. Call your doctor for medical advice about side effects. You may report side effects to FDA at1-800-FDA-1088. Where should I keep my medication? This drug is given in a hospital or clinic and will not be stored at home. NOTE: This sheet is a summary. It may not cover all possible information. If you have questions about this medicine, talk to your doctor, pharmacist, orhealth care provider.  2022 Elsevier/Gold Standard (2019-11-30 10:08:25)

## 2021-03-31 ENCOUNTER — Other Ambulatory Visit: Payer: Self-pay | Admitting: Oncology

## 2021-04-03 ENCOUNTER — Other Ambulatory Visit: Payer: Self-pay

## 2021-04-03 ENCOUNTER — Ambulatory Visit (HOSPITAL_BASED_OUTPATIENT_CLINIC_OR_DEPARTMENT_OTHER)
Admission: RE | Admit: 2021-04-03 | Discharge: 2021-04-03 | Disposition: A | Discharge status: Home / Self Care | Payer: Medicare Other | Source: Ambulatory Visit | Attending: Oncology | Admitting: Oncology

## 2021-04-03 DIAGNOSIS — N200 Calculus of kidney: Secondary | ICD-10-CM | POA: Diagnosis not present

## 2021-04-03 DIAGNOSIS — C210 Malignant neoplasm of anus, unspecified: Secondary | ICD-10-CM | POA: Insufficient documentation

## 2021-04-04 ENCOUNTER — Inpatient Hospital Stay (HOSPITAL_BASED_OUTPATIENT_CLINIC_OR_DEPARTMENT_OTHER): Payer: Medicare Other | Admitting: Oncology

## 2021-04-04 ENCOUNTER — Inpatient Hospital Stay: Payer: Medicare Other

## 2021-04-04 ENCOUNTER — Other Ambulatory Visit: Payer: Medicare Other

## 2021-04-04 VITALS — BP 137/81 | HR 82 | Temp 97.8°F | Resp 18 | Ht 62.0 in | Wt 100.0 lb

## 2021-04-04 DIAGNOSIS — I2510 Atherosclerotic heart disease of native coronary artery without angina pectoris: Secondary | ICD-10-CM | POA: Diagnosis not present

## 2021-04-04 DIAGNOSIS — C20 Malignant neoplasm of rectum: Secondary | ICD-10-CM | POA: Diagnosis not present

## 2021-04-04 DIAGNOSIS — I10 Essential (primary) hypertension: Secondary | ICD-10-CM | POA: Diagnosis not present

## 2021-04-04 DIAGNOSIS — C210 Malignant neoplasm of anus, unspecified: Secondary | ICD-10-CM

## 2021-04-04 DIAGNOSIS — J449 Chronic obstructive pulmonary disease, unspecified: Secondary | ICD-10-CM | POA: Diagnosis not present

## 2021-04-04 DIAGNOSIS — Z5112 Encounter for antineoplastic immunotherapy: Secondary | ICD-10-CM | POA: Diagnosis not present

## 2021-04-04 DIAGNOSIS — E785 Hyperlipidemia, unspecified: Secondary | ICD-10-CM | POA: Diagnosis not present

## 2021-04-04 LAB — CMP (CANCER CENTER ONLY)
ALT: 9 U/L (ref 0–44)
AST: 16 U/L (ref 15–41)
Albumin: 3.7 g/dL (ref 3.5–5.0)
Alkaline Phosphatase: 169 U/L — ABNORMAL HIGH (ref 38–126)
Anion gap: 9 (ref 5–15)
BUN: 9 mg/dL (ref 8–23)
CO2: 28 mmol/L (ref 22–32)
Calcium: 9.6 mg/dL (ref 8.9–10.3)
Chloride: 103 mmol/L (ref 98–111)
Creatinine: 0.46 mg/dL (ref 0.44–1.00)
GFR, Estimated: >60 mL/min (ref >60)
Glucose, Bld: 120 mg/dL — ABNORMAL HIGH (ref 70–99)
Potassium: 3.6 mmol/L (ref 3.5–5.1)
Sodium: 140 mmol/L (ref 135–145)
Total Bilirubin: 0.4 mg/dL (ref 0.3–1.2)
Total Protein: 6.8 g/dL (ref 6.5–8.1)

## 2021-04-04 LAB — CBC WITH DIFFERENTIAL (CANCER CENTER ONLY)
Abs Immature Granulocytes: 0.02 10*3/uL (ref 0.00–0.07)
Basophils Absolute: 0 10*3/uL (ref 0.0–0.1)
Basophils Relative: 1 %
Eosinophils Absolute: 0.1 10*3/uL (ref 0.0–0.5)
Eosinophils Relative: 1 %
HCT: 43.6 % (ref 36.0–46.0)
Hemoglobin: 13.9 g/dL (ref 12.0–15.0)
Immature Granulocytes: 0 %
Lymphocytes Relative: 18 %
Lymphs Abs: 1.3 10*3/uL (ref 0.7–4.0)
MCH: 29.7 pg (ref 26.0–34.0)
MCHC: 31.9 g/dL (ref 30.0–36.0)
MCV: 93.2 fL (ref 80.0–100.0)
Monocytes Absolute: 0.4 10*3/uL (ref 0.1–1.0)
Monocytes Relative: 6 %
Neutro Abs: 5.1 10*3/uL (ref 1.7–7.7)
Neutrophils Relative %: 74 %
Platelet Count: 243 10*3/uL (ref 150–400)
RBC: 4.68 MIL/uL (ref 3.87–5.11)
RDW: 13.1 % (ref 11.5–15.5)
WBC Count: 7 10*3/uL (ref 4.0–10.5)
nRBC: 0 % (ref 0.0–0.2)

## 2021-04-04 MED ORDER — OXYCODONE HCL 5 MG PO TABS: 5.0000 mg | ORAL_TABLET | Freq: Once | ORAL | Status: DC

## 2021-04-04 MED ORDER — OXYCODONE HCL 5 MG PO TABS
5.0000 mg | ORAL_TABLET | Freq: Once | ORAL | Status: AC
  Administered 2021-04-04: 5 mg via ORAL
  Filled 2021-04-04: qty 1

## 2021-04-04 MED ORDER — HYDROCODONE-ACETAMINOPHEN 5-325 MG PO TABS: 1.0000 | ORAL_TABLET | ORAL | 0 refills | Status: AC | PRN

## 2021-04-04 MED ORDER — ACETAMINOPHEN 325 MG PO TABS: 325.0000 mg | ORAL_TABLET | Freq: Once | ORAL | Status: DC

## 2021-04-04 MED ORDER — ACETAMINOPHEN 325 MG PO TABS
325.0000 mg | ORAL_TABLET | Freq: Once | ORAL | Status: AC
  Administered 2021-04-04: 325 mg via ORAL
  Filled 2021-04-04: qty 1

## 2021-04-04 NOTE — Progress Notes (Signed)
[place] OFFICE PROGRESS NOTE   Diagnosis: Anal cancer  INTERVAL HISTORY:   Patricia [name] returns as scheduled.  She is here with her niece.  She completed another treatment with nivolumab on 03/21/2021.  She continues to have pain at the anal area.  She takes hydrocodone as needed.  The pain has not improved since beginning nivolumab.  She is having bowel movements, but this is sometimes painful.  Objective:  Vital signs in last 24 hours:  Blood pressure 137/81, pulse 82, temperature 97.8 F (36.6 C), temperature source Oral, resp. rate 18, height '5\' 2"'$  (1.575 m), weight 100 lb (45.4 kg), SpO2 97 %.     Lymphatics: No inguinal nodes Resp: Distant breath sounds, no respiratory distress Cardio: Regular rate and rhythm GI: No hepatosplenomegaly, no mass, mild tenderness at the right side of the pubic bone Vascular: No leg edema   Lab Results:  Lab Results  Component Value Date   WBC 7.0 04/04/2021   HGB 13.9 04/04/2021   HCT 43.6 04/04/2021   MCV 93.2 04/04/2021   PLT 243 04/04/2021   NEUTROABS 5.1 04/04/2021    CMP  Lab Results  Component Value Date   NA 140 04/04/2021   K 3.6 04/04/2021   CL 103 04/04/2021   CO2 28 04/04/2021   GLUCOSE 120 (H) 04/04/2021   BUN 9 04/04/2021   CREATININE 0.46 04/04/2021   CALCIUM 9.6 04/04/2021   PROT 6.8 04/04/2021   ALBUMIN 3.7 04/04/2021   AST 16 04/04/2021   ALT 9 04/04/2021   ALKPHOS 169 (H) 04/04/2021   BILITOT 0.4 04/04/2021   GFRNONAA >60 04/04/2021   GFRAA >60 01/16/2020     Medications: I have reviewed the patient's current medications.   Assessment/Plan: Anal cancer 09/06/2019 CT abdomen/pelvis-complex 5.1 cm x 3.2 cm x 4.1 cm posterior left perirectal fluid collection/abscess.   09/07/2019 flexible sigmoidoscopy by Dr. Sim Boast cm x 5 cm hard fungating anal mass located predominantly at the left anterior-lateral area and extending to the dentate line with minor anal stenosis.  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 PET scan 09/22/2019-large intensely hypermetabolic exophytic mass extending posteriorly from the anus consistent with primary anal carcinoma.  Local regional hypermetabolic nodal metastasis to a left inguinal lymph node.  No iliac nodal metastasis or para-aortic nodal metastasis.  No liver or other visceral metastasis. Intensely hypermetabolic rounded right supraclavicular mass. Cycle 1 day 1 dose-reduced 5FU/mitomycin and concurrent RT 09/26/19  Cycle two 5-FU/mitomycin-C 10/24/2019, radiation completed 11/11/2019 10/16/2020 -2 cm firm raised lesion at the 6 o'clock position, anterior anal verge Biopsy of anterior nodule by Dr. Marcello Moores 10/16/2020-squamous cell carcinoma PET scan 11/21/2020- interval decrease in size and decreased metabolic activity in the anal mass.  No findings suspicious for metastatic disease.  2.7 cm hypermetabolic left thyroid nodule slightly more hypermetabolic. Thyroid ultrasound 01/11/2021-right mid thyroid nodule measuring 3.4 cm, FNA recommended. 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 Nausea and diarrhea, secondary to chemoRT starting day 5, imodium partially effective. Started lomotil 10/05/19.   10. Mild pancytopenia secondary to chemo, on day 10 of chemoRT WBC 3.7, Hgb 11.4, PLT 142  11.  Hypermetabolic mass right neck on PET scan 09/22/2019.  She declined further evaluation citing a long history of thyroid nodules.  Thyroid ultrasound 01/11/2021-right mid thyroid nodule measuring 3.4 cm, FNA recommended.  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  She has completed 4 treatments with nivolumab.  She has tolerated the treatment well.  She has persistent anal pain.  This has progressed recently.  The pelvic CT from yesterday has not been officially read.  By my review the anal mass has not changed significantly.  She has not experienced clinical or significant radiologic improvement with nivolumab.  I recommend discontinuing nivolumab.  We discussed salvage systemic treatment options.  I recommend Taxol/carboplatin.  We discussed  3-week and weekly schedules. We also discussed hospice care.  Patricia [name] indicated she does not wish to consider surgery.  She is leaning against chemotherapy and would like to consider this further after the official pelvic CT report is available.  I refilled her prescription for hydrocodone.  She will return for an office visit in 2 weeks.  She will begin a stool softener and MiraLAX.    Patricia Coder, MD  04/04/2021  12:11 PM

## 2021-04-05 ENCOUNTER — Telehealth: Payer: Self-pay | Admitting: *Deleted

## 2021-04-05 DIAGNOSIS — C210 Malignant neoplasm of anus, unspecified: Secondary | ICD-10-CM

## 2021-04-05 NOTE — Telephone Encounter (Signed)
Left VM for niece, Tye Maryland to call back for scan results.

## 2021-04-08 ENCOUNTER — Encounter: Payer: Self-pay | Admitting: Oncology

## 2021-04-08 ENCOUNTER — Encounter: Payer: Self-pay | Admitting: Nurse Practitioner

## 2021-04-08 NOTE — Telephone Encounter (Signed)
Spoke w/niece Joni Fears and made aware of CT results. Confirmed with her, that at some point the cancer will progress, but we do not know when. Patient told her this weekend she does not want any further chemo and they agree to Hospice referral. Niece requests they contact her to arrange for the initial visit. Order placed for [facility] and called office w/referral as well.

## 2021-04-10 ENCOUNTER — Inpatient Hospital Stay: Payer: Medicare Other | Admitting: Nurse Practitioner

## 2021-04-16 DIAGNOSIS — Z87440 Personal history of urinary (tract) infections: Secondary | ICD-10-CM | POA: Diagnosis not present

## 2021-04-16 DIAGNOSIS — I5032 Chronic diastolic (congestive) heart failure: Secondary | ICD-10-CM | POA: Diagnosis not present

## 2021-04-16 DIAGNOSIS — Z681 Body mass index (BMI) 19 or less, adult: Secondary | ICD-10-CM | POA: Diagnosis not present

## 2021-04-16 DIAGNOSIS — F419 Anxiety disorder, unspecified: Secondary | ICD-10-CM | POA: Diagnosis not present

## 2021-04-16 DIAGNOSIS — F32A Depression, unspecified: Secondary | ICD-10-CM | POA: Diagnosis not present

## 2021-04-16 DIAGNOSIS — Z8616 Personal history of COVID-19: Secondary | ICD-10-CM | POA: Diagnosis not present

## 2021-04-16 DIAGNOSIS — I739 Peripheral vascular disease, unspecified: Secondary | ICD-10-CM | POA: Diagnosis not present

## 2021-04-16 DIAGNOSIS — I2510 Atherosclerotic heart disease of native coronary artery without angina pectoris: Secondary | ICD-10-CM | POA: Diagnosis not present

## 2021-04-16 DIAGNOSIS — C210 Malignant neoplasm of anus, unspecified: Secondary | ICD-10-CM | POA: Diagnosis not present

## 2021-04-16 DIAGNOSIS — E441 Mild protein-calorie malnutrition: Secondary | ICD-10-CM | POA: Diagnosis not present

## 2021-04-16 DIAGNOSIS — E041 Nontoxic single thyroid nodule: Secondary | ICD-10-CM | POA: Diagnosis not present

## 2021-04-16 DIAGNOSIS — J309 Allergic rhinitis, unspecified: Secondary | ICD-10-CM | POA: Diagnosis not present

## 2021-04-17 DIAGNOSIS — C210 Malignant neoplasm of anus, unspecified: Secondary | ICD-10-CM | POA: Diagnosis not present

## 2021-04-17 DIAGNOSIS — E441 Mild protein-calorie malnutrition: Secondary | ICD-10-CM | POA: Diagnosis not present

## 2021-04-17 DIAGNOSIS — Z681 Body mass index (BMI) 19 or less, adult: Secondary | ICD-10-CM | POA: Diagnosis not present

## 2021-04-17 DIAGNOSIS — Z87440 Personal history of urinary (tract) infections: Secondary | ICD-10-CM | POA: Diagnosis not present

## 2021-04-17 DIAGNOSIS — Z8616 Personal history of COVID-19: Secondary | ICD-10-CM | POA: Diagnosis not present

## 2021-04-17 DIAGNOSIS — E041 Nontoxic single thyroid nodule: Secondary | ICD-10-CM | POA: Diagnosis not present

## 2021-04-18 ENCOUNTER — Inpatient Hospital Stay: Payer: Medicare Other | Admitting: Nurse Practitioner

## 2021-04-18 DIAGNOSIS — Z8616 Personal history of COVID-19: Secondary | ICD-10-CM | POA: Diagnosis not present

## 2021-04-18 DIAGNOSIS — C210 Malignant neoplasm of anus, unspecified: Secondary | ICD-10-CM | POA: Diagnosis not present

## 2021-04-18 DIAGNOSIS — E441 Mild protein-calorie malnutrition: Secondary | ICD-10-CM | POA: Diagnosis not present

## 2021-04-18 DIAGNOSIS — Z87440 Personal history of urinary (tract) infections: Secondary | ICD-10-CM | POA: Diagnosis not present

## 2021-04-18 DIAGNOSIS — Z681 Body mass index (BMI) 19 or less, adult: Secondary | ICD-10-CM | POA: Diagnosis not present

## 2021-04-18 DIAGNOSIS — E041 Nontoxic single thyroid nodule: Secondary | ICD-10-CM | POA: Diagnosis not present

## 2021-04-23 DIAGNOSIS — E441 Mild protein-calorie malnutrition: Secondary | ICD-10-CM | POA: Diagnosis not present

## 2021-04-23 DIAGNOSIS — Z681 Body mass index (BMI) 19 or less, adult: Secondary | ICD-10-CM | POA: Diagnosis not present

## 2021-04-23 DIAGNOSIS — C210 Malignant neoplasm of anus, unspecified: Secondary | ICD-10-CM | POA: Diagnosis not present

## 2021-04-23 DIAGNOSIS — Z87440 Personal history of urinary (tract) infections: Secondary | ICD-10-CM | POA: Diagnosis not present

## 2021-04-23 DIAGNOSIS — Z8616 Personal history of COVID-19: Secondary | ICD-10-CM | POA: Diagnosis not present

## 2021-04-23 DIAGNOSIS — E041 Nontoxic single thyroid nodule: Secondary | ICD-10-CM | POA: Diagnosis not present

## 2021-04-24 DIAGNOSIS — Z8616 Personal history of COVID-19: Secondary | ICD-10-CM | POA: Diagnosis not present

## 2021-04-24 DIAGNOSIS — Z681 Body mass index (BMI) 19 or less, adult: Secondary | ICD-10-CM | POA: Diagnosis not present

## 2021-04-24 DIAGNOSIS — E041 Nontoxic single thyroid nodule: Secondary | ICD-10-CM | POA: Diagnosis not present

## 2021-04-24 DIAGNOSIS — C210 Malignant neoplasm of anus, unspecified: Secondary | ICD-10-CM | POA: Diagnosis not present

## 2021-04-24 DIAGNOSIS — Z87440 Personal history of urinary (tract) infections: Secondary | ICD-10-CM | POA: Diagnosis not present

## 2021-04-24 DIAGNOSIS — E441 Mild protein-calorie malnutrition: Secondary | ICD-10-CM | POA: Diagnosis not present

## 2021-04-25 DIAGNOSIS — Z87440 Personal history of urinary (tract) infections: Secondary | ICD-10-CM | POA: Diagnosis not present

## 2021-04-25 DIAGNOSIS — E441 Mild protein-calorie malnutrition: Secondary | ICD-10-CM | POA: Diagnosis not present

## 2021-04-25 DIAGNOSIS — Z681 Body mass index (BMI) 19 or less, adult: Secondary | ICD-10-CM | POA: Diagnosis not present

## 2021-04-25 DIAGNOSIS — E041 Nontoxic single thyroid nodule: Secondary | ICD-10-CM | POA: Diagnosis not present

## 2021-04-25 DIAGNOSIS — Z8616 Personal history of COVID-19: Secondary | ICD-10-CM | POA: Diagnosis not present

## 2021-04-25 DIAGNOSIS — C210 Malignant neoplasm of anus, unspecified: Secondary | ICD-10-CM | POA: Diagnosis not present

## 2021-05-01 DIAGNOSIS — Z681 Body mass index (BMI) 19 or less, adult: Secondary | ICD-10-CM | POA: Diagnosis not present

## 2021-05-01 DIAGNOSIS — Z8616 Personal history of COVID-19: Secondary | ICD-10-CM | POA: Diagnosis not present

## 2021-05-01 DIAGNOSIS — C210 Malignant neoplasm of anus, unspecified: Secondary | ICD-10-CM | POA: Diagnosis not present

## 2021-05-01 DIAGNOSIS — E441 Mild protein-calorie malnutrition: Secondary | ICD-10-CM | POA: Diagnosis not present

## 2021-05-01 DIAGNOSIS — E041 Nontoxic single thyroid nodule: Secondary | ICD-10-CM | POA: Diagnosis not present

## 2021-05-01 DIAGNOSIS — Z87440 Personal history of urinary (tract) infections: Secondary | ICD-10-CM | POA: Diagnosis not present

## 2021-05-02 DIAGNOSIS — Z87440 Personal history of urinary (tract) infections: Secondary | ICD-10-CM | POA: Diagnosis not present

## 2021-05-02 DIAGNOSIS — Z8616 Personal history of COVID-19: Secondary | ICD-10-CM | POA: Diagnosis not present

## 2021-05-02 DIAGNOSIS — E441 Mild protein-calorie malnutrition: Secondary | ICD-10-CM | POA: Diagnosis not present

## 2021-05-02 DIAGNOSIS — C210 Malignant neoplasm of anus, unspecified: Secondary | ICD-10-CM | POA: Diagnosis not present

## 2021-05-02 DIAGNOSIS — E041 Nontoxic single thyroid nodule: Secondary | ICD-10-CM | POA: Diagnosis not present

## 2021-05-02 DIAGNOSIS — Z681 Body mass index (BMI) 19 or less, adult: Secondary | ICD-10-CM | POA: Diagnosis not present

## 2021-05-03 DIAGNOSIS — Z8616 Personal history of COVID-19: Secondary | ICD-10-CM | POA: Diagnosis not present

## 2021-05-03 DIAGNOSIS — Z681 Body mass index (BMI) 19 or less, adult: Secondary | ICD-10-CM | POA: Diagnosis not present

## 2021-05-03 DIAGNOSIS — E041 Nontoxic single thyroid nodule: Secondary | ICD-10-CM | POA: Diagnosis not present

## 2021-05-03 DIAGNOSIS — Z87440 Personal history of urinary (tract) infections: Secondary | ICD-10-CM | POA: Diagnosis not present

## 2021-05-03 DIAGNOSIS — E441 Mild protein-calorie malnutrition: Secondary | ICD-10-CM | POA: Diagnosis not present

## 2021-05-03 DIAGNOSIS — C210 Malignant neoplasm of anus, unspecified: Secondary | ICD-10-CM | POA: Diagnosis not present

## 2021-05-05 DIAGNOSIS — I10 Essential (primary) hypertension: Secondary | ICD-10-CM | POA: Diagnosis not present

## 2021-05-05 DIAGNOSIS — Z681 Body mass index (BMI) 19 or less, adult: Secondary | ICD-10-CM | POA: Diagnosis not present

## 2021-05-05 DIAGNOSIS — E441 Mild protein-calorie malnutrition: Secondary | ICD-10-CM | POA: Diagnosis not present

## 2021-05-05 DIAGNOSIS — E041 Nontoxic single thyroid nodule: Secondary | ICD-10-CM | POA: Diagnosis not present

## 2021-05-05 DIAGNOSIS — R062 Wheezing: Secondary | ICD-10-CM | POA: Diagnosis not present

## 2021-05-05 DIAGNOSIS — J80 Acute respiratory distress syndrome: Secondary | ICD-10-CM | POA: Diagnosis not present

## 2021-05-05 DIAGNOSIS — Z87440 Personal history of urinary (tract) infections: Secondary | ICD-10-CM | POA: Diagnosis not present

## 2021-05-05 DIAGNOSIS — C210 Malignant neoplasm of anus, unspecified: Secondary | ICD-10-CM | POA: Diagnosis not present

## 2021-05-05 DIAGNOSIS — R0902 Hypoxemia: Secondary | ICD-10-CM | POA: Diagnosis not present

## 2021-05-05 DIAGNOSIS — Z8616 Personal history of COVID-19: Secondary | ICD-10-CM | POA: Diagnosis not present

## 2021-05-06 DIAGNOSIS — Z87440 Personal history of urinary (tract) infections: Secondary | ICD-10-CM | POA: Diagnosis not present

## 2021-05-06 DIAGNOSIS — C210 Malignant neoplasm of anus, unspecified: Secondary | ICD-10-CM | POA: Diagnosis not present

## 2021-05-06 DIAGNOSIS — E041 Nontoxic single thyroid nodule: Secondary | ICD-10-CM | POA: Diagnosis not present

## 2021-05-06 DIAGNOSIS — Z681 Body mass index (BMI) 19 or less, adult: Secondary | ICD-10-CM | POA: Diagnosis not present

## 2021-05-06 DIAGNOSIS — Z8616 Personal history of COVID-19: Secondary | ICD-10-CM | POA: Diagnosis not present

## 2021-05-06 DIAGNOSIS — E441 Mild protein-calorie malnutrition: Secondary | ICD-10-CM | POA: Diagnosis not present

## 2021-05-07 DIAGNOSIS — E441 Mild protein-calorie malnutrition: Secondary | ICD-10-CM | POA: Diagnosis not present

## 2021-05-07 DIAGNOSIS — E041 Nontoxic single thyroid nodule: Secondary | ICD-10-CM | POA: Diagnosis not present

## 2021-05-07 DIAGNOSIS — Z8616 Personal history of COVID-19: Secondary | ICD-10-CM | POA: Diagnosis not present

## 2021-05-07 DIAGNOSIS — C210 Malignant neoplasm of anus, unspecified: Secondary | ICD-10-CM | POA: Diagnosis not present

## 2021-05-07 DIAGNOSIS — Z681 Body mass index (BMI) 19 or less, adult: Secondary | ICD-10-CM | POA: Diagnosis not present

## 2021-05-07 DIAGNOSIS — Z87440 Personal history of urinary (tract) infections: Secondary | ICD-10-CM | POA: Diagnosis not present

## 2021-05-08 DIAGNOSIS — Z87440 Personal history of urinary (tract) infections: Secondary | ICD-10-CM | POA: Diagnosis not present

## 2021-05-08 DIAGNOSIS — E441 Mild protein-calorie malnutrition: Secondary | ICD-10-CM | POA: Diagnosis not present

## 2021-05-08 DIAGNOSIS — Z8616 Personal history of COVID-19: Secondary | ICD-10-CM | POA: Diagnosis not present

## 2021-05-08 DIAGNOSIS — E041 Nontoxic single thyroid nodule: Secondary | ICD-10-CM | POA: Diagnosis not present

## 2021-05-08 DIAGNOSIS — C210 Malignant neoplasm of anus, unspecified: Secondary | ICD-10-CM | POA: Diagnosis not present

## 2021-05-08 DIAGNOSIS — Z681 Body mass index (BMI) 19 or less, adult: Secondary | ICD-10-CM | POA: Diagnosis not present

## 2021-05-09 DIAGNOSIS — Z8616 Personal history of COVID-19: Secondary | ICD-10-CM | POA: Diagnosis not present

## 2021-05-09 DIAGNOSIS — C210 Malignant neoplasm of anus, unspecified: Secondary | ICD-10-CM | POA: Diagnosis not present

## 2021-05-09 DIAGNOSIS — E441 Mild protein-calorie malnutrition: Secondary | ICD-10-CM | POA: Diagnosis not present

## 2021-05-09 DIAGNOSIS — Z681 Body mass index (BMI) 19 or less, adult: Secondary | ICD-10-CM | POA: Diagnosis not present

## 2021-05-09 DIAGNOSIS — E041 Nontoxic single thyroid nodule: Secondary | ICD-10-CM | POA: Diagnosis not present

## 2021-05-09 DIAGNOSIS — Z87440 Personal history of urinary (tract) infections: Secondary | ICD-10-CM | POA: Diagnosis not present

## 2021-05-11 DIAGNOSIS — E441 Mild protein-calorie malnutrition: Secondary | ICD-10-CM | POA: Diagnosis not present

## 2021-05-11 DIAGNOSIS — Z8616 Personal history of COVID-19: Secondary | ICD-10-CM | POA: Diagnosis not present

## 2021-05-11 DIAGNOSIS — I739 Peripheral vascular disease, unspecified: Secondary | ICD-10-CM | POA: Diagnosis not present

## 2021-05-11 DIAGNOSIS — J309 Allergic rhinitis, unspecified: Secondary | ICD-10-CM | POA: Diagnosis not present

## 2021-05-11 DIAGNOSIS — I5032 Chronic diastolic (congestive) heart failure: Secondary | ICD-10-CM | POA: Diagnosis not present

## 2021-05-11 DIAGNOSIS — Z681 Body mass index (BMI) 19 or less, adult: Secondary | ICD-10-CM | POA: Diagnosis not present

## 2021-05-11 DIAGNOSIS — I2510 Atherosclerotic heart disease of native coronary artery without angina pectoris: Secondary | ICD-10-CM | POA: Diagnosis not present

## 2021-05-11 DIAGNOSIS — C210 Malignant neoplasm of anus, unspecified: Secondary | ICD-10-CM | POA: Diagnosis not present

## 2021-05-11 DIAGNOSIS — E041 Nontoxic single thyroid nodule: Secondary | ICD-10-CM | POA: Diagnosis not present

## 2021-05-11 DIAGNOSIS — Z87440 Personal history of urinary (tract) infections: Secondary | ICD-10-CM | POA: Diagnosis not present

## 2021-05-11 DIAGNOSIS — F32A Depression, unspecified: Secondary | ICD-10-CM | POA: Diagnosis not present

## 2021-05-11 DIAGNOSIS — F419 Anxiety disorder, unspecified: Secondary | ICD-10-CM | POA: Diagnosis not present

## 2021-05-12 DIAGNOSIS — E041 Nontoxic single thyroid nodule: Secondary | ICD-10-CM | POA: Diagnosis not present

## 2021-05-12 DIAGNOSIS — Z681 Body mass index (BMI) 19 or less, adult: Secondary | ICD-10-CM | POA: Diagnosis not present

## 2021-05-12 DIAGNOSIS — Z8616 Personal history of COVID-19: Secondary | ICD-10-CM | POA: Diagnosis not present

## 2021-05-12 DIAGNOSIS — Z87440 Personal history of urinary (tract) infections: Secondary | ICD-10-CM | POA: Diagnosis not present

## 2021-05-12 DIAGNOSIS — C210 Malignant neoplasm of anus, unspecified: Secondary | ICD-10-CM | POA: Diagnosis not present

## 2021-05-12 DIAGNOSIS — E441 Mild protein-calorie malnutrition: Secondary | ICD-10-CM | POA: Diagnosis not present

## 2021-05-13 DIAGNOSIS — E041 Nontoxic single thyroid nodule: Secondary | ICD-10-CM | POA: Diagnosis not present

## 2021-05-13 DIAGNOSIS — E441 Mild protein-calorie malnutrition: Secondary | ICD-10-CM | POA: Diagnosis not present

## 2021-05-13 DIAGNOSIS — R410 Disorientation, unspecified: Secondary | ICD-10-CM | POA: Diagnosis not present

## 2021-05-13 DIAGNOSIS — Z8616 Personal history of COVID-19: Secondary | ICD-10-CM | POA: Diagnosis not present

## 2021-05-13 DIAGNOSIS — I10 Essential (primary) hypertension: Secondary | ICD-10-CM | POA: Diagnosis not present

## 2021-05-13 DIAGNOSIS — Z87440 Personal history of urinary (tract) infections: Secondary | ICD-10-CM | POA: Diagnosis not present

## 2021-05-13 DIAGNOSIS — Z743 Need for continuous supervision: Secondary | ICD-10-CM | POA: Diagnosis not present

## 2021-05-13 DIAGNOSIS — C210 Malignant neoplasm of anus, unspecified: Secondary | ICD-10-CM | POA: Diagnosis not present

## 2021-05-13 DIAGNOSIS — Z681 Body mass index (BMI) 19 or less, adult: Secondary | ICD-10-CM | POA: Diagnosis not present

## 2021-05-14 DIAGNOSIS — C210 Malignant neoplasm of anus, unspecified: Secondary | ICD-10-CM | POA: Diagnosis not present

## 2021-05-14 DIAGNOSIS — Z87440 Personal history of urinary (tract) infections: Secondary | ICD-10-CM | POA: Diagnosis not present

## 2021-05-14 DIAGNOSIS — E441 Mild protein-calorie malnutrition: Secondary | ICD-10-CM | POA: Diagnosis not present

## 2021-05-14 DIAGNOSIS — E041 Nontoxic single thyroid nodule: Secondary | ICD-10-CM | POA: Diagnosis not present

## 2021-05-14 DIAGNOSIS — Z8616 Personal history of COVID-19: Secondary | ICD-10-CM | POA: Diagnosis not present

## 2021-05-14 DIAGNOSIS — Z681 Body mass index (BMI) 19 or less, adult: Secondary | ICD-10-CM | POA: Diagnosis not present

## 2021-05-15 ENCOUNTER — Telehealth: Payer: Self-pay

## 2021-05-15 NOTE — Telephone Encounter (Signed)
Received fax from [facility] passed away on May 16, 2021.

## 2021-05-15 NOTE — Telephone Encounter (Signed)
Very sad about her passing, she was a great lady.

## 2021-06-11 DEATH — deceased

## 2021-08-30 ENCOUNTER — Ambulatory Visit: Payer: Medicare Other | Admitting: Internal Medicine
